# Patient Record
Sex: Female | Born: 1935
Health system: Southern US, Community
[De-identification: ages and names within clinical notes are randomized; demographics above are authoritative.]

## PROBLEM LIST (undated history)

## (undated) DIAGNOSIS — S2232XA Fracture of one rib, left side, initial encounter for closed fracture: Secondary | ICD-10-CM

## (undated) DIAGNOSIS — E785 Hyperlipidemia, unspecified: Secondary | ICD-10-CM

## (undated) DIAGNOSIS — I10 Essential (primary) hypertension: Secondary | ICD-10-CM

## (undated) HISTORY — DX: Hyperlipidemia, unspecified: E78.5

## (undated) HISTORY — DX: Essential (primary) hypertension: I10

## (undated) HISTORY — PX: REFRACTIVE SURGERY: SHX103

## (undated) HISTORY — PX: OTHER SURGICAL HISTORY: SHX169

## (undated) HISTORY — PX: APPENDECTOMY: SHX54

## (undated) HISTORY — DX: Fracture of one rib, left side, initial encounter for closed fracture: S22.32XA

---

## 2001-01-03 ENCOUNTER — Encounter: Admission: RE | Admit: 2001-01-03 | Discharge: 2001-04-03 | Payer: Self-pay | Admitting: Family Medicine

## 2001-05-17 ENCOUNTER — Other Ambulatory Visit: Admission: RE | Admit: 2001-05-17 | Discharge: 2001-05-17 | Payer: Self-pay | Admitting: Family Medicine

## 2001-08-24 ENCOUNTER — Encounter: Admission: RE | Admit: 2001-08-24 | Discharge: 2001-11-22 | Payer: Self-pay | Admitting: Family Medicine

## 2001-12-01 ENCOUNTER — Encounter: Admission: RE | Admit: 2001-12-01 | Discharge: 2001-12-01 | Payer: Self-pay | Admitting: Family Medicine

## 2001-12-01 ENCOUNTER — Encounter: Payer: Self-pay | Admitting: Family Medicine

## 2001-12-13 ENCOUNTER — Encounter: Payer: Self-pay | Admitting: Otolaryngology

## 2001-12-13 ENCOUNTER — Encounter: Admission: RE | Admit: 2001-12-13 | Discharge: 2001-12-13 | Payer: Self-pay | Admitting: Otolaryngology

## 2001-12-20 ENCOUNTER — Ambulatory Visit (HOSPITAL_COMMUNITY): Admission: RE | Admit: 2001-12-20 | Discharge: 2001-12-20 | Payer: Self-pay | Admitting: Family Medicine

## 2001-12-20 ENCOUNTER — Encounter: Payer: Self-pay | Admitting: Internal Medicine

## 2002-06-19 ENCOUNTER — Encounter: Admission: RE | Admit: 2002-06-19 | Discharge: 2002-09-17 | Payer: Self-pay | Admitting: Family Medicine

## 2002-07-27 ENCOUNTER — Encounter: Payer: Self-pay | Admitting: Internal Medicine

## 2002-07-27 ENCOUNTER — Ambulatory Visit (HOSPITAL_COMMUNITY): Admission: RE | Admit: 2002-07-27 | Discharge: 2002-07-27 | Payer: Self-pay | Admitting: Internal Medicine

## 2002-10-02 ENCOUNTER — Ambulatory Visit (HOSPITAL_COMMUNITY): Admission: RE | Admit: 2002-10-02 | Discharge: 2002-10-02 | Payer: Self-pay | Admitting: Internal Medicine

## 2002-12-05 ENCOUNTER — Encounter: Payer: Self-pay | Admitting: Family Medicine

## 2002-12-05 ENCOUNTER — Encounter: Admission: RE | Admit: 2002-12-05 | Discharge: 2002-12-05 | Payer: Self-pay | Admitting: Family Medicine

## 2003-12-05 ENCOUNTER — Encounter: Admission: RE | Admit: 2003-12-05 | Discharge: 2003-12-05 | Payer: Self-pay | Admitting: Family Medicine

## 2003-12-20 ENCOUNTER — Other Ambulatory Visit: Admission: RE | Admit: 2003-12-20 | Discharge: 2003-12-20 | Payer: Self-pay | Admitting: Family Medicine

## 2003-12-25 ENCOUNTER — Encounter: Admission: RE | Admit: 2003-12-25 | Discharge: 2003-12-25 | Payer: Self-pay | Admitting: Family Medicine

## 2004-01-21 ENCOUNTER — Encounter: Admission: RE | Admit: 2004-01-21 | Discharge: 2004-01-21 | Payer: Self-pay | Admitting: Family Medicine

## 2004-01-21 ENCOUNTER — Ambulatory Visit: Payer: Self-pay | Admitting: Family Medicine

## 2004-03-12 ENCOUNTER — Encounter
Admission: RE | Admit: 2004-03-12 | Discharge: 2004-06-10 | Payer: Self-pay | Admitting: Physical Medicine & Rehabilitation

## 2004-03-13 ENCOUNTER — Ambulatory Visit: Payer: Self-pay | Admitting: Physical Medicine & Rehabilitation

## 2004-03-20 ENCOUNTER — Encounter
Admission: RE | Admit: 2004-03-20 | Discharge: 2004-06-12 | Payer: Self-pay | Admitting: Physical Medicine & Rehabilitation

## 2004-05-08 ENCOUNTER — Ambulatory Visit: Payer: Self-pay | Admitting: Physical Medicine & Rehabilitation

## 2004-05-20 ENCOUNTER — Ambulatory Visit: Payer: Self-pay | Admitting: Family Medicine

## 2004-06-03 ENCOUNTER — Ambulatory Visit: Payer: Self-pay

## 2004-06-17 ENCOUNTER — Ambulatory Visit: Payer: Self-pay | Admitting: Family Medicine

## 2004-06-18 ENCOUNTER — Ambulatory Visit: Payer: Self-pay | Admitting: Family Medicine

## 2004-07-29 ENCOUNTER — Ambulatory Visit: Payer: Self-pay | Admitting: Family Medicine

## 2004-09-05 ENCOUNTER — Ambulatory Visit: Payer: Self-pay | Admitting: Family Medicine

## 2004-09-08 ENCOUNTER — Encounter
Admission: RE | Admit: 2004-09-08 | Discharge: 2004-10-02 | Payer: Self-pay | Admitting: Physical Medicine & Rehabilitation

## 2004-09-11 ENCOUNTER — Ambulatory Visit: Payer: Self-pay | Admitting: Internal Medicine

## 2004-09-17 ENCOUNTER — Ambulatory Visit: Payer: Self-pay | Admitting: Family Medicine

## 2004-09-22 ENCOUNTER — Ambulatory Visit: Payer: Self-pay | Admitting: Family Medicine

## 2004-11-18 ENCOUNTER — Encounter
Admission: RE | Admit: 2004-11-18 | Discharge: 2004-12-15 | Payer: Self-pay | Admitting: Physical Medicine & Rehabilitation

## 2004-11-20 ENCOUNTER — Ambulatory Visit: Payer: Self-pay | Admitting: Family Medicine

## 2004-11-25 ENCOUNTER — Ambulatory Visit: Payer: Self-pay | Admitting: Cardiology

## 2004-12-04 ENCOUNTER — Ambulatory Visit: Payer: Self-pay | Admitting: Family Medicine

## 2004-12-05 ENCOUNTER — Encounter: Admission: RE | Admit: 2004-12-05 | Discharge: 2004-12-05 | Payer: Self-pay | Admitting: Family Medicine

## 2004-12-08 ENCOUNTER — Ambulatory Visit: Payer: Self-pay | Admitting: Physical Medicine & Rehabilitation

## 2004-12-08 ENCOUNTER — Encounter
Admission: RE | Admit: 2004-12-08 | Discharge: 2005-03-08 | Payer: Self-pay | Admitting: Physical Medicine & Rehabilitation

## 2004-12-15 ENCOUNTER — Encounter: Admission: RE | Admit: 2004-12-15 | Discharge: 2004-12-15 | Payer: Self-pay | Admitting: Family Medicine

## 2004-12-15 ENCOUNTER — Ambulatory Visit: Payer: Self-pay | Admitting: Family Medicine

## 2004-12-16 ENCOUNTER — Encounter
Admission: RE | Admit: 2004-12-16 | Discharge: 2005-03-16 | Payer: Self-pay | Admitting: Physical Medicine & Rehabilitation

## 2004-12-16 ENCOUNTER — Ambulatory Visit: Payer: Self-pay

## 2004-12-18 ENCOUNTER — Ambulatory Visit: Payer: Self-pay | Admitting: Family Medicine

## 2004-12-22 ENCOUNTER — Ambulatory Visit: Payer: Self-pay | Admitting: Family Medicine

## 2005-01-02 ENCOUNTER — Ambulatory Visit: Payer: Self-pay | Admitting: Family Medicine

## 2005-01-06 ENCOUNTER — Encounter: Admission: RE | Admit: 2005-01-06 | Discharge: 2005-01-06 | Payer: Self-pay | Admitting: Family Medicine

## 2005-01-13 ENCOUNTER — Encounter: Admission: RE | Admit: 2005-01-13 | Discharge: 2005-01-13 | Payer: Self-pay | Admitting: Family Medicine

## 2005-03-31 ENCOUNTER — Ambulatory Visit: Payer: Self-pay | Admitting: Family Medicine

## 2005-04-10 ENCOUNTER — Ambulatory Visit: Payer: Self-pay | Admitting: Family Medicine

## 2005-05-22 ENCOUNTER — Ambulatory Visit: Payer: Self-pay | Admitting: Family Medicine

## 2005-06-15 ENCOUNTER — Ambulatory Visit: Payer: Self-pay | Admitting: Family Medicine

## 2005-06-30 ENCOUNTER — Ambulatory Visit: Payer: Self-pay | Admitting: Family Medicine

## 2005-07-02 ENCOUNTER — Ambulatory Visit: Payer: Self-pay | Admitting: Family Medicine

## 2005-10-07 ENCOUNTER — Ambulatory Visit: Payer: Self-pay | Admitting: Family Medicine

## 2005-10-09 ENCOUNTER — Ambulatory Visit: Payer: Self-pay | Admitting: Family Medicine

## 2005-12-16 ENCOUNTER — Ambulatory Visit: Payer: Self-pay | Admitting: Family Medicine

## 2005-12-23 ENCOUNTER — Ambulatory Visit: Payer: Self-pay | Admitting: Family Medicine

## 2005-12-29 ENCOUNTER — Encounter: Admission: RE | Admit: 2005-12-29 | Discharge: 2006-02-02 | Payer: Self-pay | Admitting: Family Medicine

## 2006-01-01 ENCOUNTER — Ambulatory Visit: Payer: Self-pay | Admitting: Family Medicine

## 2006-01-08 ENCOUNTER — Encounter: Admission: RE | Admit: 2006-01-08 | Discharge: 2006-01-08 | Payer: Self-pay | Admitting: Family Medicine

## 2006-01-19 ENCOUNTER — Ambulatory Visit: Payer: Self-pay | Admitting: Family Medicine

## 2006-01-19 LAB — CONVERTED CEMR LAB
ALT: 21 units/L (ref 0–40)
AST: 28 units/L (ref 0–37)
Albumin: 3.8 g/dL (ref 3.5–5.2)
Alkaline Phosphatase: 33 units/L — ABNORMAL LOW (ref 39–117)
BUN: 10 mg/dL (ref 6–23)
CO2: 28 meq/L (ref 19–32)
Calcium: 9.3 mg/dL (ref 8.4–10.5)
Chloride: 100 meq/L (ref 96–112)
Creatinine, Ser: 0.8 mg/dL (ref 0.4–1.2)
GFR calc non Af Amer: 76 mL/min
Glomerular Filtration Rate, Af Am: 91 mL/min/{1.73_m2}
Glucose, Bld: 114 mg/dL — ABNORMAL HIGH (ref 70–99)
Hgb A1c MFr Bld: 6.1 % — ABNORMAL HIGH (ref 4.6–6.0)
Potassium: 4.2 meq/L (ref 3.5–5.1)
Sodium: 136 meq/L (ref 135–145)
TSH: 1.89 microintl units/mL (ref 0.35–5.50)
Total Bilirubin: 1.2 mg/dL (ref 0.3–1.2)
Total Protein: 6.1 g/dL (ref 6.0–8.3)

## 2006-01-29 ENCOUNTER — Ambulatory Visit: Payer: Self-pay | Admitting: Family Medicine

## 2006-02-03 ENCOUNTER — Encounter: Admission: RE | Admit: 2006-02-03 | Discharge: 2006-02-25 | Payer: Self-pay | Admitting: Family Medicine

## 2006-06-04 ENCOUNTER — Ambulatory Visit: Payer: Self-pay | Admitting: Family Medicine

## 2006-06-07 ENCOUNTER — Encounter: Admission: RE | Admit: 2006-06-07 | Discharge: 2006-08-11 | Payer: Self-pay | Admitting: Family Medicine

## 2006-06-08 ENCOUNTER — Ambulatory Visit: Payer: Self-pay | Admitting: Family Medicine

## 2006-06-08 LAB — CONVERTED CEMR LAB
BUN: 12 mg/dL (ref 6–23)
Basophils Absolute: 0 10*3/uL (ref 0.0–0.1)
Basophils Relative: 0.7 % (ref 0.0–1.0)
CO2: 31 meq/L (ref 19–32)
Calcium: 9.6 mg/dL (ref 8.4–10.5)
Chloride: 105 meq/L (ref 96–112)
Cholesterol: 82 mg/dL (ref 0–200)
Creatinine, Ser: 0.8 mg/dL (ref 0.4–1.2)
Eosinophils Absolute: 0.1 10*3/uL (ref 0.0–0.6)
Eosinophils Relative: 3 % (ref 0.0–5.0)
Folate: >20 ng/mL
GFR calc Af Amer: 91 mL/min
GFR calc non Af Amer: 76 mL/min
Glucose, Bld: 88 mg/dL (ref 70–99)
HCT: 36.3 % (ref 36.0–46.0)
HDL: 37.8 mg/dL — ABNORMAL LOW (ref >39.0)
Hemoglobin: 12.5 g/dL (ref 12.0–15.0)
Hgb A1c MFr Bld: 6.4 % — ABNORMAL HIGH (ref 4.6–6.0)
LDL Cholesterol: 32 mg/dL (ref 0–99)
Lymphocytes Relative: 38.3 % (ref 12.0–46.0)
MCHC: 34.3 g/dL (ref 30.0–36.0)
MCV: 86.7 fL (ref 78.0–100.0)
Monocytes Absolute: 0.5 10*3/uL (ref 0.2–0.7)
Monocytes Relative: 10.5 % (ref 3.0–11.0)
Neutro Abs: 2.1 10*3/uL (ref 1.4–7.7)
Neutrophils Relative %: 47.5 % (ref 43.0–77.0)
Platelets: 183 10*3/uL (ref 150–400)
Potassium: 3.8 meq/L (ref 3.5–5.1)
RBC: 4.19 M/uL (ref 3.87–5.11)
RDW: 13.2 % (ref 11.5–14.6)
Sodium: 141 meq/L (ref 135–145)
TSH: 3.21 microintl units/mL (ref 0.35–5.50)
Total CHOL/HDL Ratio: 2.2
Triglycerides: 63 mg/dL (ref 0–149)
VLDL: 13 mg/dL (ref 0–40)
Vitamin B-12: 449 pg/mL (ref 211–911)
WBC: 4.4 10*3/uL — ABNORMAL LOW (ref 4.5–10.5)

## 2006-07-08 ENCOUNTER — Encounter: Payer: Self-pay | Admitting: Family Medicine

## 2006-08-05 ENCOUNTER — Ambulatory Visit: Payer: Self-pay | Admitting: Family Medicine

## 2006-08-05 DIAGNOSIS — I10 Essential (primary) hypertension: Secondary | ICD-10-CM

## 2006-08-05 DIAGNOSIS — E041 Nontoxic single thyroid nodule: Secondary | ICD-10-CM | POA: Insufficient documentation

## 2006-08-05 DIAGNOSIS — E1151 Type 2 diabetes mellitus with diabetic peripheral angiopathy without gangrene: Secondary | ICD-10-CM

## 2006-08-05 DIAGNOSIS — M810 Age-related osteoporosis without current pathological fracture: Secondary | ICD-10-CM | POA: Insufficient documentation

## 2006-08-05 DIAGNOSIS — E785 Hyperlipidemia, unspecified: Secondary | ICD-10-CM

## 2006-08-05 DIAGNOSIS — E1165 Type 2 diabetes mellitus with hyperglycemia: Secondary | ICD-10-CM

## 2006-08-06 ENCOUNTER — Encounter: Admission: RE | Admit: 2006-08-06 | Discharge: 2006-08-06 | Payer: Self-pay | Admitting: Family Medicine

## 2006-08-09 LAB — CONVERTED CEMR LAB
Free T4: 0.9 ng/dL (ref 0.6–1.6)
T3, Free: 2.5 pg/mL (ref 2.3–4.2)
TSH: 3 microintl units/mL (ref 0.35–5.50)

## 2006-08-11 ENCOUNTER — Encounter: Payer: Self-pay | Admitting: Family Medicine

## 2006-10-06 ENCOUNTER — Telehealth (INDEPENDENT_AMBULATORY_CARE_PROVIDER_SITE_OTHER): Payer: Self-pay | Admitting: *Deleted

## 2006-10-11 ENCOUNTER — Ambulatory Visit: Payer: Self-pay | Admitting: Family Medicine

## 2006-10-12 ENCOUNTER — Ambulatory Visit: Payer: Self-pay | Admitting: Family Medicine

## 2006-10-12 DIAGNOSIS — S61209A Unspecified open wound of unspecified finger without damage to nail, initial encounter: Secondary | ICD-10-CM

## 2006-10-12 LAB — CONVERTED CEMR LAB
ALT: 21 units/L (ref 0–35)
AST: 25 units/L (ref 0–37)
Albumin: 3.7 g/dL (ref 3.5–5.2)
Alkaline Phosphatase: 34 units/L — ABNORMAL LOW (ref 39–117)
BUN: 10 mg/dL (ref 6–23)
Basophils Absolute: 0 10*3/uL (ref 0.0–0.1)
Basophils Relative: 0.6 % (ref 0.0–1.0)
Bilirubin, Direct: 0.1 mg/dL (ref 0.0–0.3)
CO2: 30 meq/L (ref 19–32)
Calcium: 8.9 mg/dL (ref 8.4–10.5)
Chloride: 103 meq/L (ref 96–112)
Cholesterol: 90 mg/dL (ref 0–200)
Creatinine, Ser: 0.7 mg/dL (ref 0.4–1.2)
Eosinophils Absolute: 0.2 10*3/uL (ref 0.0–0.6)
Eosinophils Relative: 3.5 % (ref 0.0–5.0)
GFR calc Af Amer: 107 mL/min
GFR calc non Af Amer: 88 mL/min
Glucose, Bld: 116 mg/dL — ABNORMAL HIGH (ref 70–99)
HCT: 37.1 % (ref 36.0–46.0)
HDL: 37.6 mg/dL — ABNORMAL LOW (ref >39.0)
Hemoglobin: 12.7 g/dL (ref 12.0–15.0)
Hgb A1c MFr Bld: 6.3 % — ABNORMAL HIGH (ref 4.6–6.0)
LDL Cholesterol: 39 mg/dL (ref 0–99)
Lymphocytes Relative: 36.8 % (ref 12.0–46.0)
MCHC: 34.2 g/dL (ref 30.0–36.0)
MCV: 87.9 fL (ref 78.0–100.0)
Monocytes Absolute: 0.5 10*3/uL (ref 0.2–0.7)
Monocytes Relative: 9.5 % (ref 3.0–11.0)
Neutro Abs: 2.6 10*3/uL (ref 1.4–7.7)
Neutrophils Relative %: 49.6 % (ref 43.0–77.0)
Platelets: 196 10*3/uL (ref 150–400)
Potassium: 4.4 meq/L (ref 3.5–5.1)
RBC: 4.22 M/uL (ref 3.87–5.11)
RDW: 12.8 % (ref 11.5–14.6)
Sodium: 139 meq/L (ref 135–145)
TSH: 1.97 microintl units/mL (ref 0.35–5.50)
Total Bilirubin: 1.3 mg/dL — ABNORMAL HIGH (ref 0.3–1.2)
Total CHOL/HDL Ratio: 2.4
Total Protein: 6.3 g/dL (ref 6.0–8.3)
Triglycerides: 68 mg/dL (ref 0–149)
VLDL: 14 mg/dL (ref 0–40)
WBC: 5.3 10*3/uL (ref 4.5–10.5)

## 2006-10-13 ENCOUNTER — Encounter: Payer: Self-pay | Admitting: Family Medicine

## 2006-11-23 ENCOUNTER — Encounter: Payer: Self-pay | Admitting: Family Medicine

## 2006-11-29 ENCOUNTER — Encounter: Admission: RE | Admit: 2006-11-29 | Discharge: 2007-01-13 | Payer: Self-pay | Admitting: Family Medicine

## 2006-11-29 ENCOUNTER — Encounter: Payer: Self-pay | Admitting: Family Medicine

## 2006-12-08 ENCOUNTER — Encounter: Payer: Self-pay | Admitting: Family Medicine

## 2006-12-17 ENCOUNTER — Encounter: Payer: Self-pay | Admitting: Family Medicine

## 2007-01-04 ENCOUNTER — Encounter: Payer: Self-pay | Admitting: Family Medicine

## 2007-01-05 ENCOUNTER — Encounter: Payer: Self-pay | Admitting: Family Medicine

## 2007-01-12 ENCOUNTER — Ambulatory Visit: Payer: Self-pay | Admitting: Family Medicine

## 2007-03-08 ENCOUNTER — Encounter: Admission: RE | Admit: 2007-03-08 | Discharge: 2007-03-08 | Payer: Self-pay | Admitting: Family Medicine

## 2007-03-14 ENCOUNTER — Encounter (INDEPENDENT_AMBULATORY_CARE_PROVIDER_SITE_OTHER): Payer: Self-pay | Admitting: *Deleted

## 2007-05-16 ENCOUNTER — Ambulatory Visit: Payer: Self-pay | Admitting: Family Medicine

## 2007-05-16 ENCOUNTER — Telehealth (INDEPENDENT_AMBULATORY_CARE_PROVIDER_SITE_OTHER): Payer: Self-pay | Admitting: *Deleted

## 2007-05-16 DIAGNOSIS — N390 Urinary tract infection, site not specified: Secondary | ICD-10-CM

## 2007-05-16 LAB — CONVERTED CEMR LAB
Bilirubin Urine: NEGATIVE
Glucose, Urine, Semiquant: NEGATIVE
Ketones, urine, test strip: NEGATIVE
Nitrite: NEGATIVE
Protein, U semiquant: 30
Specific Gravity, Urine: >=1.03
Urobilinogen, UA: NEGATIVE
WBC Urine, dipstick: NEGATIVE
pH: 5.5

## 2007-05-17 ENCOUNTER — Encounter: Payer: Self-pay | Admitting: Family Medicine

## 2007-05-18 ENCOUNTER — Encounter: Payer: Self-pay | Admitting: Family Medicine

## 2007-05-23 ENCOUNTER — Encounter (INDEPENDENT_AMBULATORY_CARE_PROVIDER_SITE_OTHER): Payer: Self-pay | Admitting: *Deleted

## 2007-05-30 ENCOUNTER — Telehealth (INDEPENDENT_AMBULATORY_CARE_PROVIDER_SITE_OTHER): Payer: Self-pay | Admitting: *Deleted

## 2007-06-01 ENCOUNTER — Ambulatory Visit: Payer: Self-pay | Admitting: Family Medicine

## 2007-06-06 ENCOUNTER — Telehealth (INDEPENDENT_AMBULATORY_CARE_PROVIDER_SITE_OTHER): Payer: Self-pay | Admitting: *Deleted

## 2007-06-07 ENCOUNTER — Encounter (INDEPENDENT_AMBULATORY_CARE_PROVIDER_SITE_OTHER): Payer: Self-pay | Admitting: *Deleted

## 2007-07-14 ENCOUNTER — Encounter: Payer: Self-pay | Admitting: Family Medicine

## 2007-07-20 ENCOUNTER — Ambulatory Visit: Payer: Self-pay | Admitting: Family Medicine

## 2007-07-20 DIAGNOSIS — S86819A Strain of other muscle(s) and tendon(s) at lower leg level, unspecified leg, initial encounter: Secondary | ICD-10-CM

## 2007-07-20 DIAGNOSIS — S838X9A Sprain of other specified parts of unspecified knee, initial encounter: Secondary | ICD-10-CM

## 2007-07-22 ENCOUNTER — Telehealth (INDEPENDENT_AMBULATORY_CARE_PROVIDER_SITE_OTHER): Payer: Self-pay | Admitting: *Deleted

## 2007-07-28 ENCOUNTER — Telehealth (INDEPENDENT_AMBULATORY_CARE_PROVIDER_SITE_OTHER): Payer: Self-pay | Admitting: *Deleted

## 2007-08-03 ENCOUNTER — Telehealth (INDEPENDENT_AMBULATORY_CARE_PROVIDER_SITE_OTHER): Payer: Self-pay | Admitting: *Deleted

## 2007-10-27 ENCOUNTER — Encounter: Payer: Self-pay | Admitting: Family Medicine

## 2007-12-09 ENCOUNTER — Ambulatory Visit: Payer: Self-pay | Admitting: Family Medicine

## 2007-12-09 ENCOUNTER — Other Ambulatory Visit: Admission: RE | Admit: 2007-12-09 | Discharge: 2007-12-09 | Payer: Self-pay | Admitting: Family Medicine

## 2007-12-09 ENCOUNTER — Encounter: Payer: Self-pay | Admitting: Family Medicine

## 2007-12-13 ENCOUNTER — Encounter: Payer: Self-pay | Admitting: Family Medicine

## 2007-12-15 ENCOUNTER — Ambulatory Visit: Payer: Self-pay | Admitting: Family Medicine

## 2007-12-15 LAB — CONVERTED CEMR LAB
Bilirubin Urine: NEGATIVE
Blood in Urine, dipstick: NEGATIVE
Glucose, Urine, Semiquant: NEGATIVE
Ketones, urine, test strip: NEGATIVE
Nitrite: NEGATIVE
Protein, U semiquant: NEGATIVE
Specific Gravity, Urine: <1.005
Urobilinogen, UA: 0.2
WBC Urine, dipstick: NEGATIVE
pH: 8

## 2007-12-17 LAB — CONVERTED CEMR LAB
ALT: 25 units/L (ref 0–35)
AST: 24 units/L (ref 0–37)
Albumin: 3.8 g/dL (ref 3.5–5.2)
Alkaline Phosphatase: 42 units/L (ref 39–117)
BUN: 11 mg/dL (ref 6–23)
Basophils Absolute: 0 10*3/uL (ref 0.0–0.1)
Basophils Relative: 0.2 % (ref 0.0–3.0)
Bilirubin, Direct: 0.1 mg/dL (ref 0.0–0.3)
CO2: 31 meq/L (ref 19–32)
Calcium: 9 mg/dL (ref 8.4–10.5)
Chloride: 101 meq/L (ref 96–112)
Cholesterol: 95 mg/dL (ref 0–200)
Creatinine, Ser: 0.7 mg/dL (ref 0.4–1.2)
Eosinophils Absolute: 0.2 10*3/uL (ref 0.0–0.7)
Eosinophils Relative: 3 % (ref 0.0–5.0)
GFR calc Af Amer: 106 mL/min
GFR calc non Af Amer: 88 mL/min
Glucose, Bld: 93 mg/dL (ref 70–99)
HCT: 38.9 % (ref 36.0–46.0)
HDL: 45.3 mg/dL (ref >39.0)
Hemoglobin: 13.3 g/dL (ref 12.0–15.0)
LDL Cholesterol: 39 mg/dL (ref 0–99)
Lymphocytes Relative: 34 % (ref 12.0–46.0)
MCHC: 34.1 g/dL (ref 30.0–36.0)
MCV: 86.8 fL (ref 78.0–100.0)
Monocytes Absolute: 0.6 10*3/uL (ref 0.1–1.0)
Monocytes Relative: 10.6 % (ref 3.0–12.0)
Neutro Abs: 3 10*3/uL (ref 1.4–7.7)
Neutrophils Relative %: 52.2 % (ref 43.0–77.0)
Platelets: 211 10*3/uL (ref 150–400)
Potassium: 4.1 meq/L (ref 3.5–5.1)
RBC: 4.48 M/uL (ref 3.87–5.11)
RDW: 12.7 % (ref 11.5–14.6)
Sodium: 138 meq/L (ref 135–145)
TSH: 2.47 microintl units/mL (ref 0.35–5.50)
Total Bilirubin: 1.2 mg/dL (ref 0.3–1.2)
Total CHOL/HDL Ratio: 2.1
Total Protein: 6.7 g/dL (ref 6.0–8.3)
Triglycerides: 54 mg/dL (ref 0–149)
VLDL: 11 mg/dL (ref 0–40)
Vit D, 1,25-Dihydroxy: 42 (ref 30–89)
WBC: 5.8 10*3/uL (ref 4.5–10.5)

## 2007-12-20 ENCOUNTER — Encounter (INDEPENDENT_AMBULATORY_CARE_PROVIDER_SITE_OTHER): Payer: Self-pay | Admitting: *Deleted

## 2007-12-20 ENCOUNTER — Telehealth (INDEPENDENT_AMBULATORY_CARE_PROVIDER_SITE_OTHER): Payer: Self-pay | Admitting: *Deleted

## 2007-12-27 ENCOUNTER — Encounter: Payer: Self-pay | Admitting: Family Medicine

## 2007-12-27 ENCOUNTER — Telehealth (INDEPENDENT_AMBULATORY_CARE_PROVIDER_SITE_OTHER): Payer: Self-pay | Admitting: *Deleted

## 2007-12-29 ENCOUNTER — Ambulatory Visit: Payer: Self-pay | Admitting: Family Medicine

## 2007-12-29 ENCOUNTER — Ambulatory Visit: Payer: Self-pay | Admitting: Internal Medicine

## 2008-01-06 ENCOUNTER — Telehealth (INDEPENDENT_AMBULATORY_CARE_PROVIDER_SITE_OTHER): Payer: Self-pay | Admitting: *Deleted

## 2008-01-06 ENCOUNTER — Encounter (INDEPENDENT_AMBULATORY_CARE_PROVIDER_SITE_OTHER): Payer: Self-pay | Admitting: *Deleted

## 2008-01-10 ENCOUNTER — Encounter (INDEPENDENT_AMBULATORY_CARE_PROVIDER_SITE_OTHER): Payer: Self-pay | Admitting: *Deleted

## 2008-01-10 ENCOUNTER — Telehealth (INDEPENDENT_AMBULATORY_CARE_PROVIDER_SITE_OTHER): Payer: Self-pay | Admitting: *Deleted

## 2008-01-23 DIAGNOSIS — K219 Gastro-esophageal reflux disease without esophagitis: Secondary | ICD-10-CM | POA: Insufficient documentation

## 2008-01-24 ENCOUNTER — Ambulatory Visit: Payer: Self-pay | Admitting: Internal Medicine

## 2008-01-24 DIAGNOSIS — K5900 Constipation, unspecified: Secondary | ICD-10-CM

## 2008-02-01 ENCOUNTER — Ambulatory Visit: Payer: Self-pay | Admitting: Internal Medicine

## 2008-03-02 ENCOUNTER — Telehealth (INDEPENDENT_AMBULATORY_CARE_PROVIDER_SITE_OTHER): Payer: Self-pay | Admitting: *Deleted

## 2008-03-09 ENCOUNTER — Encounter: Admission: RE | Admit: 2008-03-09 | Discharge: 2008-03-09 | Payer: Self-pay | Admitting: Family Medicine

## 2008-04-13 ENCOUNTER — Encounter: Payer: Self-pay | Admitting: Family Medicine

## 2008-05-16 ENCOUNTER — Encounter: Admission: RE | Admit: 2008-05-16 | Discharge: 2008-05-16 | Payer: Self-pay | Admitting: Family Medicine

## 2008-05-21 ENCOUNTER — Encounter (INDEPENDENT_AMBULATORY_CARE_PROVIDER_SITE_OTHER): Payer: Self-pay | Admitting: *Deleted

## 2008-06-19 ENCOUNTER — Telehealth (INDEPENDENT_AMBULATORY_CARE_PROVIDER_SITE_OTHER): Payer: Self-pay | Admitting: *Deleted

## 2008-07-02 ENCOUNTER — Encounter: Payer: Self-pay | Admitting: Family Medicine

## 2008-12-17 ENCOUNTER — Other Ambulatory Visit: Admission: RE | Admit: 2008-12-17 | Discharge: 2008-12-17 | Payer: Self-pay | Admitting: Family Medicine

## 2008-12-17 ENCOUNTER — Encounter: Payer: Self-pay | Admitting: Family Medicine

## 2008-12-17 ENCOUNTER — Ambulatory Visit: Payer: Self-pay | Admitting: Family Medicine

## 2008-12-17 DIAGNOSIS — R141 Gas pain: Secondary | ICD-10-CM

## 2008-12-17 DIAGNOSIS — R142 Eructation: Secondary | ICD-10-CM

## 2008-12-17 DIAGNOSIS — R143 Flatulence: Secondary | ICD-10-CM

## 2008-12-18 ENCOUNTER — Ambulatory Visit: Payer: Self-pay | Admitting: Diagnostic Radiology

## 2008-12-18 ENCOUNTER — Ambulatory Visit (HOSPITAL_BASED_OUTPATIENT_CLINIC_OR_DEPARTMENT_OTHER): Admission: RE | Admit: 2008-12-18 | Discharge: 2008-12-18 | Payer: Self-pay | Admitting: Family Medicine

## 2008-12-18 ENCOUNTER — Encounter (INDEPENDENT_AMBULATORY_CARE_PROVIDER_SITE_OTHER): Payer: Self-pay | Admitting: *Deleted

## 2008-12-18 ENCOUNTER — Telehealth: Payer: Self-pay | Admitting: Family Medicine

## 2008-12-18 ENCOUNTER — Encounter: Payer: Self-pay | Admitting: Family Medicine

## 2008-12-18 DIAGNOSIS — D259 Leiomyoma of uterus, unspecified: Secondary | ICD-10-CM

## 2008-12-19 ENCOUNTER — Ambulatory Visit: Payer: Self-pay | Admitting: Internal Medicine

## 2008-12-19 ENCOUNTER — Encounter: Payer: Self-pay | Admitting: Family Medicine

## 2008-12-20 ENCOUNTER — Telehealth (INDEPENDENT_AMBULATORY_CARE_PROVIDER_SITE_OTHER): Payer: Self-pay | Admitting: *Deleted

## 2008-12-20 DIAGNOSIS — G8911 Acute pain due to trauma: Secondary | ICD-10-CM | POA: Insufficient documentation

## 2008-12-20 DIAGNOSIS — M545 Low back pain: Secondary | ICD-10-CM

## 2008-12-21 ENCOUNTER — Encounter: Payer: Self-pay | Admitting: Family Medicine

## 2008-12-25 ENCOUNTER — Encounter: Admission: RE | Admit: 2008-12-25 | Discharge: 2009-02-18 | Payer: Self-pay | Admitting: Family Medicine

## 2008-12-26 ENCOUNTER — Encounter: Payer: Self-pay | Admitting: Family Medicine

## 2008-12-31 ENCOUNTER — Encounter (INDEPENDENT_AMBULATORY_CARE_PROVIDER_SITE_OTHER): Payer: Self-pay | Admitting: *Deleted

## 2009-02-06 ENCOUNTER — Encounter: Payer: Self-pay | Admitting: Family Medicine

## 2009-02-11 ENCOUNTER — Telehealth: Payer: Self-pay | Admitting: Family Medicine

## 2009-02-18 ENCOUNTER — Encounter: Admission: RE | Admit: 2009-02-18 | Discharge: 2009-04-05 | Payer: Self-pay | Admitting: Family Medicine

## 2009-03-11 ENCOUNTER — Encounter: Admission: RE | Admit: 2009-03-11 | Discharge: 2009-03-11 | Payer: Self-pay | Admitting: Family Medicine

## 2009-04-04 ENCOUNTER — Encounter: Payer: Self-pay | Admitting: Family Medicine

## 2009-04-22 ENCOUNTER — Encounter: Payer: Self-pay | Admitting: Family Medicine

## 2009-04-29 ENCOUNTER — Telehealth: Payer: Self-pay | Admitting: Family Medicine

## 2009-05-01 ENCOUNTER — Telehealth: Payer: Self-pay | Admitting: Family Medicine

## 2009-06-28 ENCOUNTER — Ambulatory Visit: Payer: Self-pay | Admitting: Family Medicine

## 2009-06-28 DIAGNOSIS — E1149 Type 2 diabetes mellitus with other diabetic neurological complication: Secondary | ICD-10-CM

## 2009-07-01 LAB — CONVERTED CEMR LAB
ALT: 19 units/L (ref 0–35)
AST: 24 units/L (ref 0–37)
Albumin: 4.2 g/dL (ref 3.5–5.2)
Alkaline Phosphatase: 53 units/L (ref 39–117)
BUN: 11 mg/dL (ref 6–23)
Basophils Absolute: 0 10*3/uL (ref 0.0–0.1)
Basophils Relative: 0.2 % (ref 0.0–3.0)
Bilirubin, Direct: 0.2 mg/dL (ref 0.0–0.3)
CO2: 31 meq/L (ref 19–32)
Calcium: 9.4 mg/dL (ref 8.4–10.5)
Chloride: 98 meq/L (ref 96–112)
Cholesterol: 113 mg/dL (ref 0–200)
Creatinine, Ser: 0.6 mg/dL (ref 0.4–1.2)
Eosinophils Absolute: 0.2 10*3/uL (ref 0.0–0.7)
Eosinophils Relative: 2.7 % (ref 0.0–5.0)
Folate: >20 ng/mL
GFR calc non Af Amer: 100.37 mL/min (ref >60)
Glucose, Bld: 101 mg/dL — ABNORMAL HIGH (ref 70–99)
HCT: 38.8 % (ref 36.0–46.0)
HDL: 65.3 mg/dL (ref >39.00)
Hemoglobin: 13.1 g/dL (ref 12.0–15.0)
LDL Cholesterol: 40 mg/dL (ref 0–99)
Lymphocytes Relative: 24.1 % (ref 12.0–46.0)
Lymphs Abs: 1.7 10*3/uL (ref 0.7–4.0)
MCHC: 33.7 g/dL (ref 30.0–36.0)
MCV: 86.8 fL (ref 78.0–100.0)
Monocytes Absolute: 0.7 10*3/uL (ref 0.1–1.0)
Monocytes Relative: 9.9 % (ref 3.0–12.0)
Neutro Abs: 4.4 10*3/uL (ref 1.4–7.7)
Neutrophils Relative %: 63.1 % (ref 43.0–77.0)
Platelets: 249 10*3/uL (ref 150.0–400.0)
Potassium: 4.4 meq/L (ref 3.5–5.1)
RBC: 4.47 M/uL (ref 3.87–5.11)
RDW: 13.9 % (ref 11.5–14.6)
Sodium: 137 meq/L (ref 135–145)
TSH: 2.31 microintl units/mL (ref 0.35–5.50)
Total Bilirubin: 0.8 mg/dL (ref 0.3–1.2)
Total CHOL/HDL Ratio: 2
Total Protein: 7.1 g/dL (ref 6.0–8.3)
Triglycerides: 39 mg/dL (ref 0.0–149.0)
VLDL: 7.8 mg/dL (ref 0.0–40.0)
Vit D, 25-Hydroxy: 36 ng/mL (ref 30–89)
Vitamin B-12: 617 pg/mL (ref 211–911)
WBC: 7 10*3/uL (ref 4.5–10.5)

## 2009-07-10 ENCOUNTER — Telehealth: Payer: Self-pay | Admitting: Family Medicine

## 2009-07-11 ENCOUNTER — Telehealth (INDEPENDENT_AMBULATORY_CARE_PROVIDER_SITE_OTHER): Payer: Self-pay | Admitting: *Deleted

## 2009-07-18 ENCOUNTER — Telehealth (INDEPENDENT_AMBULATORY_CARE_PROVIDER_SITE_OTHER): Payer: Self-pay | Admitting: *Deleted

## 2009-07-24 ENCOUNTER — Telehealth: Payer: Self-pay | Admitting: Internal Medicine

## 2009-07-26 ENCOUNTER — Telehealth: Payer: Self-pay | Admitting: Internal Medicine

## 2009-07-29 ENCOUNTER — Telehealth: Payer: Self-pay | Admitting: Internal Medicine

## 2009-08-23 ENCOUNTER — Encounter: Payer: Self-pay | Admitting: Family Medicine

## 2009-09-20 ENCOUNTER — Telehealth: Payer: Self-pay | Admitting: Family Medicine

## 2009-11-04 ENCOUNTER — Encounter: Payer: Self-pay | Admitting: Family Medicine

## 2009-12-23 ENCOUNTER — Ambulatory Visit: Payer: Self-pay | Admitting: Family Medicine

## 2009-12-23 ENCOUNTER — Telehealth (INDEPENDENT_AMBULATORY_CARE_PROVIDER_SITE_OTHER): Payer: Self-pay | Admitting: *Deleted

## 2009-12-23 DIAGNOSIS — J42 Unspecified chronic bronchitis: Secondary | ICD-10-CM

## 2009-12-23 DIAGNOSIS — R413 Other amnesia: Secondary | ICD-10-CM

## 2009-12-24 LAB — CONVERTED CEMR LAB
ALT: 21 units/L (ref 0–35)
AST: 22 units/L (ref 0–37)
Albumin: 4.2 g/dL (ref 3.5–5.2)
Alkaline Phosphatase: 57 units/L (ref 39–117)
BUN: 10 mg/dL (ref 6–23)
Basophils Absolute: 0 10*3/uL (ref 0.0–0.1)
Basophils Relative: 0.2 % (ref 0.0–3.0)
Bilirubin, Direct: 0.2 mg/dL (ref 0.0–0.3)
CO2: 29 meq/L (ref 19–32)
Calcium: 9 mg/dL (ref 8.4–10.5)
Chloride: 92 meq/L — ABNORMAL LOW (ref 96–112)
Creatinine, Ser: 0.5 mg/dL (ref 0.4–1.2)
Eosinophils Absolute: 0.2 10*3/uL (ref 0.0–0.7)
Eosinophils Relative: 2.1 % (ref 0.0–5.0)
Folate: >20 ng/mL
GFR calc non Af Amer: 131.5 mL/min (ref >60)
Glucose, Bld: 135 mg/dL — ABNORMAL HIGH (ref 70–99)
HCT: 39.6 % (ref 36.0–46.0)
Hemoglobin: 13.4 g/dL (ref 12.0–15.0)
Lymphocytes Relative: 23.5 % (ref 12.0–46.0)
Lymphs Abs: 2.2 10*3/uL (ref 0.7–4.0)
MCHC: 33.8 g/dL (ref 30.0–36.0)
MCV: 86.2 fL (ref 78.0–100.0)
Monocytes Absolute: 0.9 10*3/uL (ref 0.1–1.0)
Monocytes Relative: 9.8 % (ref 3.0–12.0)
Neutro Abs: 6.1 10*3/uL (ref 1.4–7.7)
Neutrophils Relative %: 64.4 % (ref 43.0–77.0)
Platelets: 252 10*3/uL (ref 150.0–400.0)
Potassium: 4.5 meq/L (ref 3.5–5.1)
RBC: 4.6 M/uL (ref 3.87–5.11)
RDW: 13.8 % (ref 11.5–14.6)
Sodium: 135 meq/L (ref 135–145)
TSH: 0.3 microintl units/mL — ABNORMAL LOW (ref 0.35–5.50)
Total Bilirubin: 1.1 mg/dL (ref 0.3–1.2)
Total Protein: 7 g/dL (ref 6.0–8.3)
Vitamin B-12: 429 pg/mL (ref 211–911)
WBC: 9.5 10*3/uL (ref 4.5–10.5)

## 2009-12-25 ENCOUNTER — Encounter: Payer: Self-pay | Admitting: Family Medicine

## 2009-12-26 ENCOUNTER — Encounter: Payer: Self-pay | Admitting: Family Medicine

## 2010-01-02 ENCOUNTER — Telehealth (INDEPENDENT_AMBULATORY_CARE_PROVIDER_SITE_OTHER): Payer: Self-pay | Admitting: *Deleted

## 2010-01-08 ENCOUNTER — Encounter (INDEPENDENT_AMBULATORY_CARE_PROVIDER_SITE_OTHER): Payer: Self-pay | Admitting: *Deleted

## 2010-01-09 ENCOUNTER — Telehealth (INDEPENDENT_AMBULATORY_CARE_PROVIDER_SITE_OTHER): Payer: Self-pay | Admitting: *Deleted

## 2010-01-14 ENCOUNTER — Telehealth (INDEPENDENT_AMBULATORY_CARE_PROVIDER_SITE_OTHER): Payer: Self-pay | Admitting: *Deleted

## 2010-01-21 ENCOUNTER — Encounter: Payer: Self-pay | Admitting: Family Medicine

## 2010-01-22 ENCOUNTER — Telehealth (INDEPENDENT_AMBULATORY_CARE_PROVIDER_SITE_OTHER): Payer: Self-pay | Admitting: *Deleted

## 2010-01-23 ENCOUNTER — Encounter: Payer: Self-pay | Admitting: Family Medicine

## 2010-01-23 ENCOUNTER — Telehealth (INDEPENDENT_AMBULATORY_CARE_PROVIDER_SITE_OTHER): Payer: Self-pay | Admitting: *Deleted

## 2010-01-24 ENCOUNTER — Telehealth (INDEPENDENT_AMBULATORY_CARE_PROVIDER_SITE_OTHER): Payer: Self-pay | Admitting: *Deleted

## 2010-01-24 ENCOUNTER — Encounter: Payer: Self-pay | Admitting: Family Medicine

## 2010-03-16 ENCOUNTER — Encounter: Payer: Self-pay | Admitting: Family Medicine

## 2010-03-23 LAB — CONVERTED CEMR LAB
ALT: 20 units/L (ref 0–35)
ALT: 22 units/L (ref 0–35)
ALT: 24 units/L (ref 0–35)
AST: 22 units/L (ref 0–37)
AST: 25 units/L (ref 0–37)
AST: 27 units/L (ref 0–37)
Albumin: 3.9 g/dL (ref 3.5–5.2)
Albumin: 4 g/dL (ref 3.5–5.2)
Albumin: 4.1 g/dL (ref 3.5–5.2)
Alkaline Phosphatase: 40 units/L (ref 39–117)
Alkaline Phosphatase: 41 units/L (ref 39–117)
Alkaline Phosphatase: 52 units/L (ref 39–117)
BUN: 11 mg/dL (ref 6–23)
BUN: 13 mg/dL (ref 6–23)
Basophils Absolute: 0 10*3/uL (ref 0.0–0.1)
Basophils Relative: 0.8 % (ref 0.0–3.0)
Bilirubin Urine: NEGATIVE
Bilirubin, Direct: 0.1 mg/dL (ref 0.0–0.3)
Bilirubin, Direct: 0.2 mg/dL (ref 0.0–0.3)
Bilirubin, Direct: 0.2 mg/dL (ref 0.0–0.3)
Blood in Urine, dipstick: NEGATIVE
CO2: 28 meq/L (ref 19–32)
CO2: 29 meq/L (ref 19–32)
Calcium: 10 mg/dL (ref 8.4–10.5)
Calcium: 9.3 mg/dL (ref 8.4–10.5)
Chloride: 100 meq/L (ref 96–112)
Chloride: 98 meq/L (ref 96–112)
Cholesterol, target level: 200 mg/dL
Cholesterol: 112 mg/dL (ref 0–200)
Creatinine, Ser: 0.6 mg/dL (ref 0.4–1.2)
Creatinine, Ser: 0.7 mg/dL (ref 0.4–1.2)
Creatinine,U: 109.3 mg/dL
Creatinine,U: 57 mg/dL
Creatinine,U: 73.3 mg/dL
Eosinophils Absolute: 0.2 10*3/uL (ref 0.0–0.7)
Eosinophils Relative: 2.7 % (ref 0.0–5.0)
Folate: >20 ng/mL
GFR calc Af Amer: 127 mL/min
GFR calc non Af Amer: 105 mL/min
GFR calc non Af Amer: 87.38 mL/min (ref >60)
Glucose, Bld: 140 mg/dL — ABNORMAL HIGH (ref 70–99)
Glucose, Bld: 171 mg/dL — ABNORMAL HIGH (ref 70–99)
Glucose, Urine, Semiquant: NEGATIVE
HCT: 39.1 % (ref 36.0–46.0)
HDL goal, serum: 40 mg/dL
HDL: 61.9 mg/dL (ref >39.00)
Hemoglobin: 13.5 g/dL (ref 12.0–15.0)
Hgb A1c MFr Bld: 6.6 % — ABNORMAL HIGH (ref 4.6–6.0)
Hgb A1c MFr Bld: 6.6 % — ABNORMAL HIGH (ref 4.6–6.0)
Hgb A1c MFr Bld: 6.7 % — ABNORMAL HIGH (ref 4.6–6.5)
Hgb A1c MFr Bld: 7 % — ABNORMAL HIGH (ref 4.6–6.0)
Ketones, urine, test strip: NEGATIVE
LDL Cholesterol: 40 mg/dL (ref 0–99)
LDL Goal: 100 mg/dL
Lymphocytes Relative: 32 % (ref 12.0–46.0)
Lymphs Abs: 1.9 10*3/uL (ref 0.7–4.0)
MCHC: 34.5 g/dL (ref 30.0–36.0)
MCV: 90.5 fL (ref 78.0–100.0)
Microalb Creat Ratio: 17.5 mg/g (ref 0.0–30.0)
Microalb Creat Ratio: 17.7 mg/g (ref 0.0–30.0)
Microalb Creat Ratio: 22 mg/g (ref 0.0–30.0)
Microalb, Ur: 1 mg/dL (ref 0.0–1.9)
Microalb, Ur: 1.3 mg/dL (ref 0.0–1.9)
Microalb, Ur: 2.4 mg/dL — ABNORMAL HIGH (ref 0.0–1.9)
Monocytes Absolute: 0.4 10*3/uL (ref 0.1–1.0)
Monocytes Relative: 6 % (ref 3.0–12.0)
Neutro Abs: 3.4 10*3/uL (ref 1.4–7.7)
Neutrophils Relative %: 58.5 % (ref 43.0–77.0)
Nitrite: NEGATIVE
Platelets: 198 10*3/uL (ref 150.0–400.0)
Potassium: 3.9 meq/L (ref 3.5–5.1)
Potassium: 4.5 meq/L (ref 3.5–5.1)
Protein, U semiquant: NEGATIVE
RBC: 4.32 M/uL (ref 3.87–5.11)
RDW: 12.3 % (ref 11.5–14.6)
Sodium: 136 meq/L (ref 135–145)
Sodium: 137 meq/L (ref 135–145)
Specific Gravity, Urine: <1.005
TSH: 2.97 microintl units/mL (ref 0.35–5.50)
Total Bilirubin: 1.1 mg/dL (ref 0.3–1.2)
Total Bilirubin: 1.1 mg/dL (ref 0.3–1.2)
Total Bilirubin: 1.2 mg/dL (ref 0.3–1.2)
Total CHOL/HDL Ratio: 2
Total Protein: 6.7 g/dL (ref 6.0–8.3)
Total Protein: 7.1 g/dL (ref 6.0–8.3)
Total Protein: 7.2 g/dL (ref 6.0–8.3)
Triglycerides: 53 mg/dL (ref 0.0–149.0)
Urobilinogen, UA: NEGATIVE
VLDL: 10.6 mg/dL (ref 0.0–40.0)
Vit D, 25-Hydroxy: 33 ng/mL (ref 30–89)
Vitamin B-12: 782 pg/mL (ref 211–911)
WBC Urine, dipstick: NEGATIVE
WBC: 5.9 10*3/uL (ref 4.5–10.5)
pH: 7.5

## 2010-03-25 ENCOUNTER — Other Ambulatory Visit: Payer: Self-pay | Admitting: Family Medicine

## 2010-03-25 DIAGNOSIS — Z1239 Encounter for other screening for malignant neoplasm of breast: Secondary | ICD-10-CM

## 2010-03-25 NOTE — Letter (Signed)
Summary: [facility] Endocrinology & Diabetes  [facility] Endocrinology & Diabetes   Imported By: Lanelle Bal 04/29/2009 12:51:55  _____________________________________________________________________  External Attachment:    Type:   Image     Comment:   External Document

## 2010-03-25 NOTE — Progress Notes (Signed)
Summary: Medication change  Phone Note Outgoing Call   Call placed by: Almeta Monas CMA Duncan Dull),  January 24, 2010 9:33 AM Call placed to: Patient Details for Reason: Change meds Summary of Call: We rcv'd fax from pharmacy advising of a possible reaction between Amlodipine and simvastatin, per Dr.Lowne change to Lipitor 20 and  pt is to have labs redrawn in 3 months...SPk with pt and made her aware., and advised to stop taking the Simvastatin. Pt voiced understanding...Marland KitchenMarland KitchenRequested a 90 day supply of meds. Initial call taken by: Almeta Monas CMA Duncan Dull),  January 24, 2010 9:35 AM    New/Updated Medications: LIPITOR 20 MG TABS (ATORVASTATIN CALCIUM) 1 by mouth once daily Prescriptions: LIPITOR 20 MG TABS (ATORVASTATIN CALCIUM) 1 by mouth once daily  #90 x 0   Entered by:   Almeta Monas CMA (AAMA)   Authorized by:   Loreen Freud DO   Signed by:   Almeta Monas CMA (AAMA) on 01/24/2010   Method used:   [facility] [phone]* (retail)       [address]       [place], [place]  [address]       Ph: [phone]       Fax: [phone]   RxID:   [id number]

## 2010-03-25 NOTE — Medication Information (Signed)
Summary: Denial for Levocetirizine Dihydrochloride/[facility]  Denial for Levocetirizine Dihydrochloride/[facility]   Imported By: Lanelle Bal 01/30/2010 13:15:21  _____________________________________________________________________  External Attachment:    Type:   Image     Comment:   External Document

## 2010-03-25 NOTE — Progress Notes (Signed)
Summary: Medication refill  Phone Note Call from Patient Call back at [phone]   Caller: Patient Call For: Dr. Marina Goodell Reason for Call: Refill Medication Summary of Call: pt. needs a refill of Miralax....[facility] [place] [phone]  Initial call taken by: Karna Christmas,  July 24, 2009 9:54 AM  Follow-up for Phone Call        [facility] and [facility] [place].  Follow-up by: Milford Cage NCMA,  July 24, 2009 1:05 PM    Prescriptions: MIRALAX  POWD (POLYETHYLENE GLYCOL 3350) Use 17 grams in 8 0z of water.  #1 x 3   Entered by:   Milford Cage NCMA   Authorized by:   Hilarie Fredrickson MD   Signed by:   Milford Cage NCMA on 07/24/2009   Method used:   Printed then faxed to ...       [facility] [phone]* (retail)       [address]       [place], [place]  [address]       Ph: [phone]       Fax: [phone]   RxID:   [id number]

## 2010-03-25 NOTE — Letter (Signed)
Summary: [facility] Endocrinology & Diabetes  [facility] Endocrinology & Diabetes   Imported By: Lanelle Bal 01/06/2010 13:04:20  _____________________________________________________________________  External Attachment:    Type:   Image     Comment:   External Document

## 2010-03-25 NOTE — Medication Information (Signed)
Summary: Appeal Paperwork for Levocetirizine Diydrochloride  Appeal Paperwork for Levocetirizine Diydrochloride   Imported By: Lanelle Bal 01/30/2010 13:13:01  _____________________________________________________________________  External Attachment:    Type:   Image     Comment:   External Document

## 2010-03-25 NOTE — Progress Notes (Signed)
Summary: refill diovan  Phone Note Refill Request Message from:  Fax from Pharmacy on January 22, 2010 8:43 AM  Refills Requested: Medication #1:  DIOVAN 320 MG  TABS 1 by mouth once daily. NEEDS OFFICE VISIT IN APRIL. [facility] [phone]  Initial call taken by: Okey Regal Spring,  January 22, 2010 8:43 AM    New/Updated Medications: DIOVAN 320 MG  TABS (VALSARTAN) 1 by mouth once daily. Prescriptions: DIOVAN 320 MG  TABS (VALSARTAN) 1 by mouth once daily.  #30 x 2   Entered by:   Almeta Monas CMA (AAMA)   Authorized by:   Loreen Freud DO   Signed by:   Almeta Monas CMA (AAMA) on 01/22/2010   Method used:   Electronically to        [facility] [phone]* (retail)       [address]       [place], [place]  [address]       Ph: [phone]       Fax: [phone]   RxID:   [id number]

## 2010-03-25 NOTE — Progress Notes (Signed)
Summary: refill wants 90 day supply  Phone Note Refill Request Message from:  Patient on Jul 11, 2009 1:09 PM  Refills Requested: Medication #1:  OMEPRAZOLE 20 MG CPDR 1 by mouth once daily. patient requested refill but it was refilled for #30 -[place] she said dr Laury Axon told her she would refill for 90 days - [facility] [address]   Method Requested: Fax to Local Pharmacy Initial call taken by: Okey Regal Spring,  Jul 11, 2009 1:12 PM    Prescriptions: OMEPRAZOLE 20 MG CPDR (OMEPRAZOLE) 1 by mouth once daily  #90 x 3   Entered by:   Army Fossa CMA   Authorized by:   Loreen Freud DO   Signed by:   Army Fossa CMA on 07/11/2009   Method used:   Electronically to        [facility] [phone]* (retail)       [address]       [place], [place]  [address]       Ph: [phone]       Fax: [phone]   RxID:   [id number]

## 2010-03-25 NOTE — Letter (Signed)
Summary: Generic Letter  [facility] [place]  [address] [place], [place] [address]   Phone: [phone]  Fax: [phone]    01/08/2010  Part D Appeals and Grievance DEpartment ATTN; [phone] [address] [place], [place] [address]  Shikira Sommerfeld ID: [id number] DOB 2034-12-19 [address] [place], [place]  [id number]   To Whom it may concern:  Ms. Fern has tried the following medications: Zyrtec and Allegra which have not been therapeutic in treating the patient allergic rhinitis, seasonal allergy and cough.           Sincerely,       Loreen Freud, DO

## 2010-03-25 NOTE — Progress Notes (Signed)
----   Converted from flag ---- ---- 07/29/2009 2:46 PM, Karna Christmas wrote: pt. went to the pharmacy and the script is not there. Needs for [facility] too be [facility] [place]   # [phone] ------------------------------  Phone Note Call from Patient   Caller: Patient Summary of Call: [facility]axed [facility] [place] to above number.   Initial call taken by: Milford Cage NCMA,  July 29, 2009 4:11 PM     Appended Document:  Refaxed once again to [phone] called [facility] to confirm they received and they did.

## 2010-03-25 NOTE — Letter (Signed)
Summary: [facility] Endocrinology & Diabetes  [facility] Endocrinology & Diabetes   Imported By: Lanelle Bal 09/03/2009 10:15:50  _____________________________________________________________________  External Attachment:    Type:   Image     Comment:   External Document

## 2010-03-25 NOTE — Progress Notes (Signed)
Summary: neurontin refill   Phone Note Refill Request Call back at Home Phone [phone] Message from:  Patient  Refills Requested: Medication #1:  NEURONTIN 100 MG CAPS 1 by mouth three times a day   Supply Requested: 3 months Pt states that she is going out of town and  would like to have a 3 months supply of med filled at [facility]... Last OV 12-23-09, last filled 06-28-09 #90 2.........Marland KitchenFelecia Deloach CMA  January 09, 2010 9:13 AM    Follow-up for Phone Call        ok to do 3 month supply with 3 refillls Follow-up by: Loreen Freud DO,  January 09, 2010 11:14 AM    Prescriptions: NEURONTIN 100 MG CAPS (GABAPENTIN) 1 by mouth three times a day  #270 x 3   Entered by:   Doristine Devoid CMA   Authorized by:   Loreen Freud DO   Signed by:   Doristine Devoid CMA on 01/09/2010   Method used:   Electronically to        [facility] [phone]* (retail)       [address]       [place], [place]  [address]       Ph: [phone]       Fax: [phone]   RxID:   [id number]

## 2010-03-25 NOTE — Progress Notes (Signed)
Summary: refill  Phone Note Refill Request Message from:  Fax from Pharmacy on April 29, 2009 8:54 AM  Refills Requested: Medication #1:  DIOVAN 320 MG  TABS 1 by mouth once daily [facility] fax [phone]   Method Requested: Fax to Local Pharmacy Next Appointment Scheduled: no appt Initial call taken by: Barb Merino,  April 29, 2009 8:54 AM  Follow-up for Phone Call        Pt has not been seen since 12/09/2007. Army Fossa CMA  April 29, 2009 9:18 AM   Additional Follow-up for Phone Call Additional follow up Details #1::        Pt seen 10/10 2010--- ok to refill until April-- pt should have ov then Additional Follow-up by: Loreen Freud DO,  April 29, 2009 10:04 AM    New/Updated Medications: DIOVAN 320 MG  TABS (VALSARTAN) 1 by mouth once daily. NEEDS OFFICE VISIT IN APRIL. Prescriptions: DIOVAN 320 MG  TABS (VALSARTAN) 1 by mouth once daily. NEEDS OFFICE VISIT IN APRIL.  #30 x 0   Entered by:   Army Fossa CMA   Authorized by:   Loreen Freud DO   Signed by:   Army Fossa CMA on 04/29/2009   Method used:   Electronically to        [facility] [phone]* (retail)       [address]       [place], [place]  [address]       Ph: [phone]       Fax: [phone]   RxID:   [id number]

## 2010-03-25 NOTE — Progress Notes (Signed)
Summary: refills  Phone Note Refill Request Call back at Home Phone [phone] Message from:  Patient  Refills Requested: Medication #1:  DIOVAN 320 MG  TABS 1 by mouth once daily.   Supply Requested: 3 months 3  month [facility] [address]..............Marland KitchenFelecia Deloach CMA  January 23, 2010 12:22 PM      Prescriptions: DIOVAN 320 MG  TABS (VALSARTAN) 1 by mouth once daily.  #90 x 0   Entered by:   Jeremy Johann CMA   Authorized by:   Loreen Freud DO   Signed by:   Jeremy Johann CMA on 01/23/2010   Method used:   Faxed to ...       [facility] [phone]* (retail)       [address]       [place], [place]  [address]       Ph: [phone]       Fax: [phone]   RxID:   [id number]

## 2010-03-25 NOTE — Progress Notes (Signed)
Summary: RE-FAX RX  Phone Note From Pharmacy   Caller: [facility] [phone]* Summary of Call: PLEASE RESUBMIT RX FOR SIMVASTATIN. Initial call taken by: Lavell Islam,  September 20, 2009 11:21 AM    Prescriptions: SIMVASTATIN 40 MG TABS (SIMVASTATIN) 1 by mouth at bedtime  #90 Tablet x 2   Entered by:   Lucious Groves CMA   Authorized by:   Loreen Freud DO   Signed by:   Lucious Groves CMA on 09/20/2009   Method used:   Electronically to        [facility] [phone]* (retail)       [address]       [place], [place]  [address]       Ph: [phone]       Fax: [phone]   RxID:   [id number]

## 2010-03-25 NOTE — Progress Notes (Signed)
Summary: Diovan  Phone Note Call from Patient Call back at Home Phone [phone]   Caller: Patient Summary of Call: Pt called and stated that she needs a 3 month supply of her Diovan, and she will come in for an appt. She would not make an appt then. I told her since we had not seen her since 2009 we could only do a 30 day supply. She insisted that I ask you if you give her 3 months. Please advise.Army Fossa CMA  May 01, 2009 10:04 AM   Follow-up for Phone Call        90 day supply x1--- we saw her in 11/2008 Follow-up by: Loreen Freud DO,  May 01, 2009 10:15 AM  Additional Follow-up for Phone Call Additional follow up Details #1::        Pt is aware. Army Fossa CMA  May 01, 2009 10:39 AM     Prescriptions: ALENDRONATE SODIUM 70 MG  TABS (ALENDRONATE SODIUM) Take one tablet weekly.  #12 Tablet x 1   Entered by:   Army Fossa CMA   Authorized by:   Loreen Freud DO   Signed by:   Army Fossa CMA on 05/01/2009   Method used:   Electronically to        [facility] [phone]* (retail)       [address]       [place], [place]  [address]       Ph: [phone]       Fax: [phone]   RxID:   [id number] DIOVAN 320 MG  TABS (VALSARTAN) 1 by mouth once daily. NEEDS OFFICE VISIT IN APRIL.  #90 x 0   Entered by:   Army Fossa CMA   Authorized by:   Loreen Freud DO   Signed by:   Army Fossa CMA on 05/01/2009   Method used:   Electronically to        [facility]S  [facility] [phone]* (retail)       [address]       [place], [place]  [address]       Ph: [phone]       Fax: [phone]   RxID:   [id number]

## 2010-03-25 NOTE — Progress Notes (Signed)
Summary: rx ?'s  Phone Note Call from Patient Call back at [phone]   Caller: husband Call For: Dr. Marina Goodell Reason for Call: Talk to Nurse Summary of Call: questions about rx's for Polyethylene and Glycol Initial call taken by: Vallarie Mare,  July 26, 2009 8:46 AM  Follow-up for Phone Call        Left message for patient to call back Darcey Nora RN, [facility]  July 26, 2009 9:04 AM  Patient  needs rx sent again to the [facility].  Rx sent again. Follow-up by: Darcey Nora RN, CGRN,  July 26, 2009 10:29 AM    Prescriptions: MIRALAX  POWD (POLYETHYLENE GLYCOL 3350) Use 17 grams in 8 0z of water.  #527 gm x 3   Entered by:   Darcey Nora RN, CGRN   Authorized by:   Hilarie Fredrickson MD   Signed by:   Darcey Nora RN, CGRN on 07/26/2009   Method used:   Printed then faxed to ...       [facility] [phone]* (retail)       [address]       [place], [place]  [address]       Ph: [phone]       Fax: [phone]   RxID:   [id number]

## 2010-03-25 NOTE — Letter (Signed)
Summary: MMSE Form  MMSE Form   Imported By: Lanelle Bal 12/31/2009 12:10:15  _____________________________________________________________________  External Attachment:    Type:   Image     Comment:   External Document

## 2010-03-25 NOTE — Progress Notes (Signed)
Summary: prior auth  Phone Note Refill Request Message from:  Fax from Pharmacy on December 23, 2009 5:01 PM  Refills Requested: Medication #1:  FLONASE 50 MCG/ACT SUSP 2 sprays each nostril once daily prior auth [id number] - - fexofenadine- nasonex  Initial call taken by: Okey Regal Spring,  December 23, 2009 5:03 PM  Follow-up for Phone Call        This doesn't make any sense---- flonase is generic and is a nose spray,   fexafenadine is allegra, and nasonex is brand name Follow-up by: Loreen Freud DO,  December 23, 2009 10:07 PM  Additional Follow-up for Phone Call Additional follow up Details #1::        Prior Auth is for the Xyzal. They will send approval or disapproval via Fax... Additional Follow-up by: Almeta Monas CMA Duncan Dull),  December 25, 2009 4:21 PM

## 2010-03-25 NOTE — Progress Notes (Signed)
Summary: wANTS RF ON COUGH MEDS-lmom  Phone Note Call from Patient   Caller: Patient Call For: Loreen Freud DO Details for Reason: wants Cherutussin RF Summary of Call: call from patient and she feels better but she wantS 2 refills of the cough meds, said she will be leaving for out of town and wants [facility], pt will be gone for 2 months c/b [phone].Marland KitchenMarland KitchenPlease advise  Initial call taken by: Almeta Monas CMA Duncan Dull),  January 14, 2010 3:32 PM  Follow-up for Phone Call        She can not stay on cough med---if cough does not completely resolve she would need to be seen refill x1 only ---for 6 oz Follow-up by: Loreen Freud DO,  January 14, 2010 4:04 PM  Additional Follow-up for Phone Call Additional follow up Details #1::        left message on machine .....Marland KitchenMarland KitchenDoristine Devoid CMA  January 14, 2010 4:35 PM   spoke w/ patient informed prescription sent to pharmacy...Marland KitchenMarland KitchenDoristine Devoid CMA  January 14, 2010 4:52 PM     Prescriptions: CHERATUSSIN AC 100-10 MG/5ML SYRP (GUAIFENESIN-CODEINE) 1-2 tsp by mouth at bedtime as needed  #6 oz x 0   Entered by:   Doristine Devoid CMA   Authorized by:   Loreen Freud DO   Signed by:   Doristine Devoid CMA on 01/14/2010   Method used:   Printed then faxed to ...       [facility] [phone]* (retail)       [address]       [place], [place]  [address]       Ph: [phone]       Fax: [phone]   RxID:   [id number]

## 2010-03-25 NOTE — Progress Notes (Signed)
Summary: prior auth APPEAL NOT NEEDED  Phone Note Refill Request   Refills Requested: Medication #1:  XYZAL 5 MG TABS 1 by mouth once daily prior auth - [id number] --- [facility] [address]  Initial call taken by: Okey Regal Spring,  January 02, 2010 9:08 AM  Follow-up for Phone Call        Prior auth denied because the requested drug is not prescribed for a use approved by the FDA oe supported by certain references, the requested drug is not considered a Part D drug and is ot covered by the plan..........Marland KitchenFelecia Deloach CMA  January 02, 2010 9:27 AM   appeal faxed and mailed awaiting response.........Marland KitchenFelecia Deloach CMA  January 08, 2010 3:27 PM   Additional Follow-up for Phone Call Additional follow up Details #1::        called checked status of appeal, still awaiting response......Marland KitchenFelecia Deloach CMA  January 15, 2010 1:07 PM  Appeal paperwork,faxed and mailed.............Marland KitchenFelecia Deloach CMA  January 21, 2010 9:13 AM  Appeal representative form faxed back awaiting response....................Marland KitchenFelecia Deloach CMA  January 23, 2010 9:24 AM   Paper work sent back stating Pt signature required. spoke with pt who states that she is doing much better and does not feel that she need med. Discuss briefly with dr Laury Axon can discontinue working on [employer] for now.............Marland KitchenFelecia Deloach CMA  January 23, 2010 12:51 PM

## 2010-03-25 NOTE — Assessment & Plan Note (Signed)
Summary: see doctor//lch   Vital Signs:  Patient profile:   76 year old female Weight:      139 pounds Pulse rate:   96 / minute Pulse rhythm:   regular BP sitting:   126 / 80  (left arm) Cuff size:   regular  Vitals Entered By: Army Fossa CMA (Jun 28, 2009 9:50 AM) CC: Pt states for 3 days she has been dizzy, worse when she lays down. Discuss fingernails   History of Present Illness:  Hypertension follow-up      This is a 76 year old woman who presents for Hypertension follow-up.  Pt unable to walk secondary to burning in feet.   .  The patient complains of lightheadedness, but denies urinary frequency, headaches, edema, impotence, rash, and fatigue.  The patient denies the following associated symptoms: chest pain, chest pressure, exercise intolerance, dyspnea, palpitations, syncope, leg edema, and pedal edema.  Compliance with medications (by patient report) has been near 100%.  The patient reports that dietary compliance has been good.  The patient reports no exercise.  Adjunctive measures currently used by the patient include salt restriction.    Type 1 diabetes mellitus follow-up      The patient is also here for Type 2 diabetes mellitus follow-up.  The patient complains of numbness of extremities, but denies polyuria, polydipsia, blurred vision, self managed hypoglycemia, hypoglycemia requiring help, weight loss, and weight gain.  Other symptoms include neuropathic pain.  The patient denies the following symptoms: chest pain, vomiting, orthostatic symptoms, poor wound healing, intermittent claudication, vision loss, and foot ulcer.  Since the last visit the patient reports good dietary compliance, compliance with medications, not exercising regularly, and monitoring blood glucose.  Since the last visit, the patient reports having had eye care by an ophthalmologist and foot care by a podiatrist.  Complications from diabetes include peripheral neuropathy.    Preventive  Screening-Counseling & Management  Alcohol-Tobacco     Alcohol drinks/day: 0     Smoking Status: never     Passive Smoke Exposure: no  Caffeine-Diet-Exercise     Caffeine use/day: 0     Does Patient Exercise: no  Current Medications (verified): 1)  Simvastatin 40 Mg Tabs (Simvastatin) .Marland Kitchen.. 1 By Mouth At Bedtime 2)  Alendronate Sodium 70 Mg  Tabs (Alendronate Sodium) .... Take One Tablet Weekly. 3)  Diovan 320 Mg  Tabs (Valsartan) .Marland Kitchen.. 1 By Mouth Once Daily. Needs Office Visit in April. 4)  Actos 15 Mg  Tabs (Pioglitazone Hcl) .Marland Kitchen.. 1 By Mouth Once Daily 5)  Amaryl 1 Mg  Tabs (Glimepiride) .... 2 By Mouth in The Afternoon 6)  Mvi .Marland Kitchen.. 3  By Mouth Once Daily 7)  Lovaza 1 Gm  Caps (Omega-3-Acid Ethyl Esters) .... Take Two Capsules Twice A Day 8)  Niaspan 500 Mg Cr-Tabs (Niacin (Antihyperlipidemic)) .... Take 1 Tablet At Bedtime 9)  Janumet 50-1000 Mg Tabs (Sitagliptin-Metformin Hcl) .Marland Kitchen.. 1 By Mouth Once Daily 10)  Prozac 10 Mg Caps (Fluoxetine Hcl) .Marland Kitchen.. 1 By Mouth Once Daily 11)  Flexeril 10 Mg Tabs (Cyclobenzaprine Hcl) .Marland Kitchen.. 1 By Mouth Three Times A Day 12)  Levemir 100 Unit/ml Soln (Insulin Detemir) .... 5 Units At Bedtime 13)  Aspirin 81 Mg  Tabs (Aspirin) .Marland Kitchen.. 1 By Mouth Once Daily 14)  One-A-Day Extras Antioxidant  Caps (Multiple Vitamins-Minerals) .... Once Daily 15)  B Complex-C  Tabs (B Complex-C) .... Once Daily 16)  Miralax  Powd (Polyethylene Glycol 3350) .... Use 17 Grams in  8 0z of Water. 17)  Norvasc 2.5 Mg Tabs (Amlodipine Besylate) .Marland Kitchen.. 1 By Mouth Once Daily 18)  Neurontin 100 Mg Caps (Gabapentin) .Marland Kitchen.. 1 By Mouth Three Times A Day  Allergies (verified): 1)  ! Sulfa 2)  ! Pcn  Past History:  Past medical, surgical, family and social histories (including risk factors) reviewed for relevance to current acute and chronic problems.  Past Medical History: Reviewed history from 08/05/2006 and no changes required. Diabetes mellitus, type  II Hyperlipidemia Hypertension  Past Surgical History: Reviewed history from 01/24/2008 and no changes required. Appendectomy surgical sterilization 40 yrs ago  Family History: Reviewed history from 12/19/2008 and no changes required. Family History Depression laryngeal CA--F Family History Diabetes 1st degree relative---M  Social History: Reviewed history from 12/09/2007 and no changes required. Married Never Smoked Drug use-no Regular exercise-no  Review of Systems      See HPI  Physical Exam  General:  Well-developed,well-nourished,in no acute distress; alert,appropriate and cooperative throughout examination Neck:  No deformities, masses, or tenderness noted. Lungs:  Normal respiratory effort, chest expands symmetrically. Lungs are clear to auscultation, no crackles or wheezes. Heart:  normal rate and no murmur.   Psych:  Oriented X3 and normally interactive.     Impression & Recommendations:  Problem # 1:  DIABETIC PERIPHERAL NEUROPATHY (ICD-250.60) gapepentin 100mg  by mouth three times a day ---start slow--- 1 by mouth at bedtime for few days then two times a day then three times a day  Her updated medication list for this problem includes:    Diovan 320 Mg Tabs (Valsartan) .Marland Kitchen... 1 by mouth once daily. needs office visit in april.    Actos 15 Mg Tabs (Pioglitazone hcl) .Marland Kitchen... 1 by mouth once daily    Amaryl 1 Mg Tabs (Glimepiride) .Marland Kitchen... 2 by mouth in the afternoon    Janumet 50-1000 Mg Tabs (Sitagliptin-metformin hcl) .Marland Kitchen... 1 by mouth once daily    Levemir 100 Unit/ml Soln (Insulin detemir) .Marland KitchenMarland KitchenMarland KitchenMarland Kitchen 5 units at bedtime    Aspirin 81 Mg Tabs (Aspirin) .Marland Kitchen... 1 by mouth once daily  Orders: Venipuncture (16109) TLB-Lipid Panel (80061-LIPID) TLB-BMP (Basic Metabolic Panel-BMET) (80048-METABOL) TLB-CBC Platelet - w/Differential (85025-CBCD) TLB-Hepatic/Liver Function Pnl (80076-HEPATIC) TLB-TSH (Thyroid Stimulating Hormone) (84443-TSH) TLB-B12 + Folate Pnl  [phone]) T-Vitamin D (25-Hydroxy) [phone])  Labs Reviewed: Creat: 0.7 (12/17/2008)    Reviewed HgBA1c results: 6.7 (12/17/2008)  6.6 (12/29/2007)  Problem # 2:  HYPERTENSION (ICD-401.9)  Her updated medication list for this problem includes:    Diovan 320 Mg Tabs (Valsartan) .Marland Kitchen... 1 by mouth once daily. needs office visit in april.    Norvasc 2.5 Mg Tabs (Amlodipine besylate) .Marland Kitchen... 1 by mouth once daily  Orders: Venipuncture (78469) TLB-Lipid Panel (80061-LIPID) TLB-BMP (Basic Metabolic Panel-BMET) (80048-METABOL) TLB-CBC Platelet - w/Differential (85025-CBCD) TLB-Hepatic/Liver Function Pnl (80076-HEPATIC) TLB-TSH (Thyroid Stimulating Hormone) (84443-TSH) TLB-B12 + Folate Pnl (62952_84132-G40/NUU) T-Vitamin D (25-Hydroxy) (72536-64403)  BP today: 126/80 Prior BP: 138/70 (12/19/2008)  Prior 10 Yr Risk Heart Disease: 17 % (01/12/2007)  Labs Reviewed: K+: 3.9 (12/17/2008) Creat: : 0.7 (12/17/2008)   Chol: 112 (12/17/2008)   HDL: 61.90 (12/17/2008)   LDL: 40 (12/17/2008)   TG: 53.0 (12/17/2008)  Problem # 3:  HYPERLIPIDEMIA (ICD-272.4)  Her updated medication list for this problem includes:    Simvastatin 40 Mg Tabs (Simvastatin) .Marland Kitchen... 1 by mouth at bedtime    Lovaza 1 Gm Caps (Omega-3-acid ethyl esters) .Marland Kitchen... Take two capsules twice a day    Niaspan 500 Mg Cr-tabs (Niacin (antihyperlipidemic)) .Marland Kitchen... Take  1 tablet at bedtime  Orders: Venipuncture (16109) TLB-Lipid Panel (80061-LIPID) TLB-BMP (Basic Metabolic Panel-BMET) (80048-METABOL) TLB-CBC Platelet - w/Differential (85025-CBCD) TLB-Hepatic/Liver Function Pnl (80076-HEPATIC) TLB-TSH (Thyroid Stimulating Hormone) (84443-TSH) TLB-B12 + Folate Pnl [phone]) T-Vitamin D (25-Hydroxy) [phone])  Labs Reviewed: SGOT: 25 (12/17/2008)   SGPT: 22 (12/17/2008)  Lipid Goals: Chol Goal: 200 (01/12/2007)   HDL Goal: 40 (01/12/2007)   LDL Goal: 100 (01/12/2007)   TG Goal: 150  (01/12/2007)  Prior 10 Yr Risk Heart Disease: 17 % (01/12/2007)   HDL:61.90 (12/17/2008), 45.3 (12/15/2007)  LDL:40 (12/17/2008), 39 (12/15/2007)  Chol:112 (12/17/2008), 95 (12/15/2007)  Trig:53.0 (12/17/2008), 54 (12/15/2007)  Problem # 4:  DIABETES MELLITUS, TYPE II (ICD-250.00) per endo Her updated medication list for this problem includes:    Diovan 320 Mg Tabs (Valsartan) .Marland Kitchen... 1 by mouth once daily. needs office visit in april.    Actos 15 Mg Tabs (Pioglitazone hcl) .Marland Kitchen... 1 by mouth once daily    Amaryl 1 Mg Tabs (Glimepiride) .Marland Kitchen... 2 by mouth in the afternoon    Janumet 50-1000 Mg Tabs (Sitagliptin-metformin hcl) .Marland Kitchen... 1 by mouth once daily    Levemir 100 Unit/ml Soln (Insulin detemir) .Marland KitchenMarland KitchenMarland KitchenMarland Kitchen 5 units at bedtime    Aspirin 81 Mg Tabs (Aspirin) .Marland Kitchen... 1 by mouth once daily  Problem # 5:  OSTEOPOROSIS NOS (ICD-733.00)  Her updated medication list for this problem includes:    Alendronate Sodium 70 Mg Tabs (Alendronate sodium) .Marland Kitchen... Take one tablet weekly.  Orders: Venipuncture (78469) TLB-Lipid Panel (80061-LIPID) TLB-BMP (Basic Metabolic Panel-BMET) (80048-METABOL) TLB-CBC Platelet - w/Differential (85025-CBCD) TLB-Hepatic/Liver Function Pnl (80076-HEPATIC) TLB-TSH (Thyroid Stimulating Hormone) (84443-TSH) TLB-B12 + Folate Pnl [phone]) T-Vitamin D (25-Hydroxy) (72536-64403)  Bone Density: osteoporosis (01/04/2008) Vit D:33 (12/17/2008), 42 (12/15/2007)  Problem # 6:  THYROID NODULE, RIGHT (ICD-241.0)  Orders: Venipuncture (47425) TLB-Lipid Panel (80061-LIPID) TLB-BMP (Basic Metabolic Panel-BMET) (80048-METABOL) TLB-CBC Platelet - w/Differential (85025-CBCD) TLB-Hepatic/Liver Function Pnl (80076-HEPATIC) TLB-TSH (Thyroid Stimulating Hormone) (84443-TSH) TLB-B12 + Folate Pnl (95638_75643-P29/JJO) T-Vitamin D (25-Hydroxy) (84166-06301)  Complete Medication List: 1)  Simvastatin 40 Mg Tabs (Simvastatin) .Marland Kitchen.. 1 by mouth at bedtime 2)  Alendronate Sodium 70 Mg  Tabs (Alendronate sodium) .... Take one tablet weekly. 3)  Diovan 320 Mg Tabs (Valsartan) .Marland Kitchen.. 1 by mouth once daily. needs office visit in april. 4)  Actos 15 Mg Tabs (Pioglitazone hcl) .Marland Kitchen.. 1 by mouth once daily 5)  Amaryl 1 Mg Tabs (Glimepiride) .... 2 by mouth in the afternoon 6)  Mvi  .Marland Kitchen.. 3  by mouth once daily 7)  Lovaza 1 Gm Caps (Omega-3-acid ethyl esters) .... Take two capsules twice a day 8)  Niaspan 500 Mg Cr-tabs (Niacin (antihyperlipidemic)) .... Take 1 tablet at bedtime 9)  Janumet 50-1000 Mg Tabs (Sitagliptin-metformin hcl) .Marland Kitchen.. 1 by mouth once daily 10)  Prozac 10 Mg Caps (Fluoxetine hcl) .Marland Kitchen.. 1 by mouth once daily 11)  Flexeril 10 Mg Tabs (Cyclobenzaprine hcl) .Marland Kitchen.. 1 by mouth three times a day 12)  Levemir 100 Unit/ml Soln (Insulin detemir) .... 5 units at bedtime 13)  Aspirin 81 Mg Tabs (Aspirin) .Marland Kitchen.. 1 by mouth once daily 14)  One-a-day Extras Antioxidant Caps (Multiple vitamins-minerals) .... Once daily 15)  B Complex-c Tabs (B complex-c) .... Once daily 16)  Miralax Powd (Polyethylene glycol 3350) .... Use 17 grams in 8 0z of water. 17)  Norvasc 2.5 Mg Tabs (Amlodipine besylate) .Marland Kitchen.. 1 by mouth once daily 18)  Neurontin 100 Mg Caps (Gabapentin) .Marland Kitchen.. 1 by mouth three times a day  Patient Instructions: 1)  Please schedule a follow-up appointment in 3 months .  Prescriptions: NEURONTIN 100 MG CAPS (GABAPENTIN) 1 by mouth three times a day  #90 x 2   Entered and Authorized by:   Loreen Freud DO   Signed by:   Loreen Freud DO on 06/28/2009   Method used:   [facility]        [facility] [phone]* (retail)       [address]       [place], [place]  [address]       Ph: [phone]       Fax: [phone]   RxID:   [id number]

## 2010-03-25 NOTE — Assessment & Plan Note (Signed)
Summary: cough for one month(has been out of state)///sph   Vital Signs:  Patient profile:   76 year old female Height:      58 inches (147.32 cm) Weight:      135.0 pounds (61.36 kg) BMI:     28.32 O2 Sat:      96 % on Room air Temp:     98.6 degrees F (37.00 degrees C) oral Pulse rate:   111 / minute BP sitting:   130 / 70  (right arm) Cuff size:   regular  Vitals Entered By: Almeta Monas CMA Duncan Dull) (December 23, 2009 2:14 PM)  O2 Flow:  Room air CC: c/o cough x59mo, Cough   History of Present Illness:  Cough      This is a 76 year old woman who presents with Cough.  The symptoms began 4 weeks ago.  Pt saw Dr in [place] and saw ENT here.  Pt was given something for 1 week in [place] and Dr Ezzard Standing said she was fine and told her to gargle and take cough syrup.  The patient reports non-productive cough, but denies productive cough, pleuritic chest pain, shortness of breath, wheezing, exertional dyspnea, fever, hemoptysis, and malaise.  Associated symtpoms include chronic rhinitis.  The patient denies the following symptoms: cold/URI symptoms, sore throat, nasal congestion, weight loss, acid reflux symptoms, and peripheral edema.  The cough is worse with lying down.  Ineffective prior treatments have included OTC cough medication.    Pt also c/o memory loss that has been progessively getting worse.   Current Medications (verified): 1)  Simvastatin 40 Mg Tabs (Simvastatin) .Marland Kitchen.. 1 By Mouth At Bedtime 2)  Alendronate Sodium 70 Mg  Tabs (Alendronate Sodium) .... Take One Tablet Weekly. 3)  Diovan 320 Mg  Tabs (Valsartan) .Marland Kitchen.. 1 By Mouth Once Daily. Needs Office Visit in April. 4)  Amaryl 1 Mg  Tabs (Glimepiride) .... 2 By Mouth in The Afternoon 5)  Mvi .Marland Kitchen.. 3  By Mouth Once Daily 6)  Lovaza 1 Gm  Caps (Omega-3-Acid Ethyl Esters) .... Take Two Capsules Twice A Day 7)  Niaspan 1000 Mg Cr-Tabs (Niacin (Antihyperlipidemic)) .... Take 1 Tablet At Bedtime 8)  Janumet 50-1000 Mg Tabs  (Sitagliptin-Metformin Hcl) .Marland Kitchen.. 1 By Mouth Once Daily 9)  Prozac 10 Mg Caps (Fluoxetine Hcl) .Marland Kitchen.. 1 By Mouth Once Daily 10)  Flexeril 10 Mg Tabs (Cyclobenzaprine Hcl) .Marland Kitchen.. 1 By Mouth Three Times A Day 11)  Levemir 100 Unit/ml Soln (Insulin Detemir) .... 5 Units At Bedtime 12)  Aspirin 81 Mg  Tabs (Aspirin) .Marland Kitchen.. 1 By Mouth Once Daily 13)  One-A-Day Extras Antioxidant  Caps (Multiple Vitamins-Minerals) .... Once Daily 14)  B Complex-C  Tabs (B Complex-C) .... Once Daily 15)  Miralax  Powd (Polyethylene Glycol 3350) .... Use 17 Grams in 8 0z of Water. 16)  Neurontin 100 Mg Caps (Gabapentin) .Marland Kitchen.. 1 By Mouth Three Times A Day 17)  Omeprazole 20 Mg Cpdr (Omeprazole) .Marland Kitchen.. 1 By Mouth Once Daily 18)  Xyzal 5 Mg Tabs (Levocetirizine Dihydrochloride) .Marland Kitchen.. 1 By Mouth Once Daily 19)  Flonase 50 Mcg/act Susp (Fluticasone Propionate) .... 2 Sprays Each Nostril Once Daily 20)  Cheratussin Ac 100-10 Mg/73ml Syrp (Guaifenesin-Codeine) .Marland Kitchen.. 1-2 Tsp By Mouth At Bedtime As Needed  Allergies (verified): 1)  ! Sulfa 2)  ! Pcn  Past History:  Past medical, surgical, family and social histories (including risk factors) reviewed for relevance to current acute and chronic problems.  Past Medical History: Reviewed history  from 08/05/2006 and no changes required. Diabetes mellitus, type II Hyperlipidemia Hypertension  Past Surgical History: Reviewed history from 01/24/2008 and no changes required. Appendectomy surgical sterilization 40 yrs ago  Family History: Reviewed history from 12/19/2008 and no changes required. Family History Depression laryngeal CA--F Family History Diabetes 1st degree relative---M  Social History: Reviewed history from 12/09/2007 and no changes required. Married Never Smoked Drug use-no Regular exercise-no  Review of Systems      See HPI  Physical Exam  General:  Well-developed,well-nourished,in no acute distress; alert,appropriate and cooperative throughout  examination Mouth:  Oral mucosa and oropharynx without lesions or exudates.  Teeth in good repair. Neck:  No deformities, masses, or tenderness noted. Lungs:  Normal respiratory effort, chest expands symmetrically. Lungs are clear to auscultation, no crackles or wheezes. Heart:  normal rate.   Neurologic:  No cranial nerve deficits noted. Station and gait are normal. Plantar reflexes are down-going bilaterally. DTRs are symmetrical throughout. Sensory, motor and coordinative functions appear intact. MMSE 30/30 Skin:  Intact without suspicious lesions or rashes Cervical Nodes:  No lymphadenopathy noted Psych:  Cognition and judgment appear intact. Alert and cooperative with normal attention span and concentration. No apparent delusions, illusions, hallucinations   Impression & Recommendations:  Problem # 1:  COUGH (ICD-786.2) pt taking omeprazole xyzal daily refill nasal spray  Problem # 2:  MEMORY LOSS (ICD-780.93)  Orders: Venipuncture (81191) TLB-B12 + Folate Pnl (47829_56213-Y86/VHQ) TLB-TSH (Thyroid Stimulating Hormone) (84443-TSH) TLB-BMP (Basic Metabolic Panel-BMET) (80048-METABOL) TLB-CBC Platelet - w/Differential (85025-CBCD) TLB-Hepatic/Liver Function Pnl (80076-HEPATIC)  Complete Medication List: 1)  Simvastatin 40 Mg Tabs (Simvastatin) .Marland Kitchen.. 1 by mouth at bedtime 2)  Alendronate Sodium 70 Mg Tabs (Alendronate sodium) .... Take one tablet weekly. 3)  Diovan 320 Mg Tabs (Valsartan) .Marland Kitchen.. 1 by mouth once daily. needs office visit in april. 4)  Amaryl 1 Mg Tabs (Glimepiride) .... 2 by mouth in the afternoon 5)  Mvi  .Marland Kitchen.. 3  by mouth once daily 6)  Lovaza 1 Gm Caps (Omega-3-acid ethyl esters) .... Take two capsules twice a day 7)  Niaspan 1000 Mg Cr-tabs (niacin (antihyperlipidemic))  .... Take 1 tablet at bedtime 8)  Janumet 50-1000 Mg Tabs (Sitagliptin-metformin hcl) .Marland Kitchen.. 1 by mouth once daily 9)  Prozac 10 Mg Caps (Fluoxetine hcl) .Marland Kitchen.. 1 by mouth once daily 10)  Flexeril  10 Mg Tabs (Cyclobenzaprine hcl) .Marland Kitchen.. 1 by mouth three times a day 11)  Levemir 100 Unit/ml Soln (Insulin detemir) .... 5 units at bedtime 12)  Aspirin 81 Mg Tabs (Aspirin) .Marland Kitchen.. 1 by mouth once daily 13)  One-a-day Extras Antioxidant Caps (Multiple vitamins-minerals) .... Once daily 14)  B Complex-c Tabs (B complex-c) .... Once daily 15)  Miralax Powd (Polyethylene glycol 3350) .... Use 17 grams in 8 0z of water. 16)  Neurontin 100 Mg Caps (Gabapentin) .Marland Kitchen.. 1 by mouth three times a day 17)  Omeprazole 20 Mg Cpdr (Omeprazole) .Marland Kitchen.. 1 by mouth once daily 18)  Xyzal 5 Mg Tabs (Levocetirizine dihydrochloride) .Marland Kitchen.. 1 by mouth once daily 19)  Flonase 50 Mcg/act Susp (Fluticasone propionate) .... 2 sprays each nostril once daily 20)  Cheratussin Ac 100-10 Mg/54ml Syrp (Guaifenesin-codeine) .Marland Kitchen.. 1-2 tsp by mouth at bedtime as needed Prescriptions: CHERATUSSIN AC 100-10 MG/5ML SYRP (GUAIFENESIN-CODEINE) 1-2 tsp by mouth at bedtime as needed  #6 oz x 0   Entered and Authorized by:   Loreen Freud DO   Signed by:   Loreen Freud DO on 12/23/2009   Method used:   Print  then Give to Patient   RxID:   [id number] [facility] 50 MCG/ACT SUSP (FLUTICASONE PROPIONATE) 2 sprays each nostril once daily  #1 x 5   Entered and Authorized by:   Loreen Freud DO   Signed by:   Loreen Freud DO on 12/23/2009   Method used:   [facility]        [facility] [phone]* (retail)       [address]       [place], [place]  [address]       Ph: [phone]       Fax: [phone]   RxID:   [id number] XYZAL 5 MG TABS (LEVOCETIRIZINE DIHYDROCHLORIDE) 1 by mouth once daily  #30 x 11   Entered and Authorized by:   Loreen Freud DO   Signed by:   Loreen Freud DO on 12/23/2009   Method used:   Electronically to        [facility] [phone]* (retail)       [address]       [place], [place]  [address]       Ph: [phone]       Fax: [phone]   RxID:    [id number]    Orders Added: 1)  Venipuncture [20254] 2)  TLB-B12 + Folate Pnl [82746_82607-B12/FOL] 3)  TLB-TSH (Thyroid Stimulating Hormone) [84443-TSH] 4)  TLB-BMP (Basic Metabolic Panel-BMET) [80048-METABOL] 5)  TLB-CBC Platelet - w/Differential [85025-CBCD] 6)  TLB-Hepatic/Liver Function Pnl [80076-HEPATIC] 7)  Est. Patient Level III [27062]

## 2010-03-25 NOTE — Progress Notes (Signed)
Summary: refill  Phone Note Refill Request Call back at [phone] Message from:  Patient  Refills Requested: Medication #1:  priolosec 20mg  1 tab qd Pt left VM that she is currently in [place] and needs a [phone]. pt would like a call once rx sent in....Marland KitchenMarland KitchenFelecia Deloach CMA  Jul 10, 2009 4:13 PM    Follow-up for Phone Call        last OV 06-28-09, not on med list pls advise....Marland KitchenMarland KitchenFelecia Deloach CMA  Jul 10, 2009 4:14 PM   pt aware out of office until 06-11-09...Marland KitchenMarland KitchenFelecia Deloach CMA  Jul 10, 2009 4:15 PM   Additional Follow-up for Phone Call Additional follow up Details #1::        rx printed out Additional Follow-up by: Loreen Freud DO,  Jul 11, 2009 8:19 AM    Additional Follow-up for Phone Call Additional follow up Details #2::    I informed pt that her med was called in. Army Fossa CMA  Jul 11, 2009 8:26 AM   New/Updated Medications: OMEPRAZOLE 20 MG CPDR (OMEPRAZOLE) 1 by mouth once daily Prescriptions: OMEPRAZOLE 20 MG CPDR (OMEPRAZOLE) 1 by mouth once daily  #30 x 11   Entered and Authorized by:   Loreen Freud DO   Signed by:   Loreen Freud DO on 07/11/2009   Method used:   Printed then faxed to ...       [facility] [phone]* (retail)       [address]       [place], [place]  [address]       Ph: [phone]       Fax: [phone]   RxID:   [id number]

## 2010-03-25 NOTE — Progress Notes (Signed)
Summary: refill  Phone Note Refill Request Message from:  Patient on Jul 18, 2009 9:37 AM  Refills Requested: Medication #1:  DIOVAN 320 MG  TABS 1 by mouth once daily. NEEDS OFFICE VISIT IN APRIL. patient wants 90 day supply  [facility] [phone]  --- [place] in [place]   Method Requested: Telephone to Pharmacy Initial call taken by: Okey Regal [place],  Jul 18, 2009 9:39 AM    Prescriptions: DIOVAN 320 MG  TABS (VALSARTAN) 1 by mouth once daily. NEEDS OFFICE VISIT IN APRIL.  #90 x 0   Entered by:   Army Fossa CMA   Authorized by:   Loreen Freud DO   Signed by:   Army Fossa CMA on 07/18/2009   Method used:   Printed then faxed to ...       [facility] [phone]* (retail)       [address]       [place], [place]  [address]       Ph: [phone]       Fax: [phone]   RxID:   [id number]

## 2010-03-25 NOTE — Progress Notes (Signed)
Summary: refill  Phone Note Call from Patient Call back at [phone]   Caller: Patient Call For: Dr. Marina Goodell Reason for Call: Refill Medication Summary of Call: asked for a refill for a laxative... [phone]... pt says last time, med order was sent in wothout a quantity Initial call taken by: Vallarie Mare,  July 29, 2009 9:27 AM  Follow-up for Phone Call        Called patient L/M stating that [facility] [place] by [facility] on 07/26/09.  If she had any questions or concerns to call me back.  Milford Cage [facility]  July 29, 2009 1:48 PM

## 2010-03-25 NOTE — Miscellaneous (Signed)
Summary: PT Discharge/[facility]  PT Discharge/[facility]   Imported By: Lanelle Bal 04/13/2009 08:08:51  _____________________________________________________________________  External Attachment:    Type:   Image     Comment:   External Document

## 2010-03-27 NOTE — Medication Information (Signed)
Summary: Appeal for Xyzal Withdrawn/[facility]  Appeal for Xyzal Withdrawn/[facility]   Imported By: Lanelle Bal 02/03/2010 11:23:05  _____________________________________________________________________  External Attachment:    Type:   Image     Comment:   External Document

## 2010-03-27 NOTE — Medication Information (Signed)
Summary: Interaction with Simvastatin & Amlodipine/[facility]  [facility]action with [facility]   Imported By: Lanelle Bal 02/03/2010 11:21:12  _____________________________________________________________________  External Attachment:    Type:   Image     Comment:   External Document

## 2010-04-14 ENCOUNTER — Telehealth (INDEPENDENT_AMBULATORY_CARE_PROVIDER_SITE_OTHER): Payer: Self-pay | Admitting: *Deleted

## 2010-04-22 NOTE — Progress Notes (Signed)
Summary: REfill  Phone Note Refill Request Message from:  Patient on April 14, 2010 3:35 PM  Refills Requested: Medication #1:  OMEPRAZOLE 20 MG CPDR 1 by mouth once daily   Dosage confirmed as above?Dosage Confirmed   Supply Requested: 3 months  Medication #2:  PROZAC 10 MG CAPS 1 by mouth once daily   Dosage confirmed as above?Dosage Confirmed   Supply Requested: 3 months  Medication #3:  NEURONTIN 100 MG CAPS 1 by mouth three times a day   Dosage confirmed as above?Dosage Confirmed   Supply Requested: 3 months  Medication #4:  DIOVAN 320 MG  TABS 1 by mouth once daily.   Dosage confirmed as above?Dosage Confirmed   Supply Requested: 3 months Simvistatin- 3 month supply Alendronate Sodium 70 mg -3 month supply    New insurance [employer]-id #[id number]  Fax # [phone]  Next Appointment Scheduled: 3.6.12 Initial call taken by: Lavell Islam,  April 14, 2010 3:41 PM    Prescriptions: OMEPRAZOLE 20 MG CPDR (OMEPRAZOLE) 1 by mouth once daily  #90 x 0   Entered by:   Almeta Monas CMA (AAMA)   Authorized by:   Loreen Freud DO   Signed by:   Almeta Monas CMA (AAMA) on 04/14/2010   Method used:   Faxed to ...       [facility] (mail-order)             , [place]         Ph: [phone]       Fax: [phone]   RxID:   [id number] NEURONTIN 100 MG CAPS (GABAPENTIN) 1 by mouth three times a day  #270 x 0   Entered by:   Almeta Monas CMA (AAMA)   Authorized by:   Loreen Freud DO   Signed by:   Almeta Monas CMA (AAMA) on 04/14/2010   Method used:   Faxed to ...       [facility] (mail-order)             , [place]         Ph: [phone]       Fax: [phone]   RxID:   [id number] LIPITOR 20 MG TABS (ATORVASTATIN CALCIUM) 1 by mouth once daily  #90 x 0   Entered by:   Almeta Monas CMA (AAMA)   Authorized by:   Loreen Freud DO   Signed by:   Almeta Monas CMA (AAMA) on 04/14/2010   Method used:   Faxed to ...       [facility] (mail-order)             , [place]         Ph: [phone]       Fax: [phone]   RxID:   [id number] PROZAC 10 MG CAPS (FLUOXETINE HCL) 1 by mouth once daily  #90 Capsule x 0   Entered by:   Almeta Monas CMA (AAMA)   Authorized by:   Loreen Freud DO   Signed by:   Almeta Monas CMA (AAMA) on 04/14/2010   Method used:   Faxed to ...       [facility] (mail-order)             , [place]         Ph: [phone]       Fax: [phone]   RxID:   [id number] DIOVAN 320 MG  TABS (VALSARTAN) 1 by mouth once daily.  #90 x 0  Entered by:   Almeta Monas CMA (AAMA)   Authorized by:   Loreen Freud DO   Signed by:   Almeta Monas CMA (AAMA) on 04/14/2010   Method used:   Faxed to ...       [facility] (mail-order)             , [place]         Ph: [phone]       Fax: [phone]   RxID:   [id number] ALENDRONATE SODIUM 70 MG  TABS (ALENDRONATE SODIUM) Take one tablet weekly.  #12 Tablet x 0   Entered by:   Almeta Monas CMA (AAMA)   Authorized by:   Loreen Freud DO   Signed by:   Almeta Monas CMA (AAMA) on 04/14/2010   Method used:   Faxed to ...       [facility] (mail-order)             , [place]         Ph: [phone]       Fax: [phone]   RxID:   [id number]

## 2010-04-29 ENCOUNTER — Encounter: Payer: Self-pay | Admitting: Family Medicine

## 2010-04-29 ENCOUNTER — Encounter (INDEPENDENT_AMBULATORY_CARE_PROVIDER_SITE_OTHER): Payer: Medicare PPO | Admitting: Family Medicine

## 2010-04-29 ENCOUNTER — Encounter (INDEPENDENT_AMBULATORY_CARE_PROVIDER_SITE_OTHER): Payer: Self-pay | Admitting: *Deleted

## 2010-04-29 DIAGNOSIS — I10 Essential (primary) hypertension: Secondary | ICD-10-CM

## 2010-04-29 DIAGNOSIS — M810 Age-related osteoporosis without current pathological fracture: Secondary | ICD-10-CM

## 2010-04-29 DIAGNOSIS — E119 Type 2 diabetes mellitus without complications: Secondary | ICD-10-CM

## 2010-04-29 DIAGNOSIS — Z136 Encounter for screening for cardiovascular disorders: Secondary | ICD-10-CM

## 2010-04-29 DIAGNOSIS — E785 Hyperlipidemia, unspecified: Secondary | ICD-10-CM

## 2010-04-29 DIAGNOSIS — Z0000 Encounter for general adult medical examination without abnormal findings: Secondary | ICD-10-CM

## 2010-04-29 DIAGNOSIS — N393 Stress incontinence (female) (male): Secondary | ICD-10-CM | POA: Insufficient documentation

## 2010-04-30 ENCOUNTER — Other Ambulatory Visit: Payer: Self-pay | Admitting: Family Medicine

## 2010-04-30 ENCOUNTER — Telehealth: Payer: Self-pay | Admitting: Family Medicine

## 2010-04-30 ENCOUNTER — Other Ambulatory Visit (INDEPENDENT_AMBULATORY_CARE_PROVIDER_SITE_OTHER): Payer: Medicare PPO

## 2010-04-30 ENCOUNTER — Encounter: Payer: Self-pay | Admitting: Family Medicine

## 2010-04-30 ENCOUNTER — Encounter (INDEPENDENT_AMBULATORY_CARE_PROVIDER_SITE_OTHER): Payer: Self-pay | Admitting: *Deleted

## 2010-04-30 DIAGNOSIS — E785 Hyperlipidemia, unspecified: Secondary | ICD-10-CM

## 2010-04-30 DIAGNOSIS — E1149 Type 2 diabetes mellitus with other diabetic neurological complication: Secondary | ICD-10-CM

## 2010-04-30 DIAGNOSIS — Z0000 Encounter for general adult medical examination without abnormal findings: Secondary | ICD-10-CM

## 2010-04-30 LAB — CONVERTED CEMR LAB
Bilirubin Urine: NEGATIVE
Ketones, urine, test strip: NEGATIVE
Nitrite: NEGATIVE
Protein, U semiquant: NEGATIVE
Urobilinogen, UA: 0.2

## 2010-04-30 LAB — BASIC METABOLIC PANEL
CO2: 29 mEq/L (ref 19–32)
Chloride: 98 mEq/L (ref 96–112)
Glucose, Bld: 135 mg/dL — ABNORMAL HIGH (ref 70–99)
Sodium: 137 mEq/L (ref 135–145)

## 2010-04-30 LAB — HEPATIC FUNCTION PANEL
Albumin: 4.1 g/dL (ref 3.5–5.2)
Alkaline Phosphatase: 55 U/L (ref 39–117)
Total Protein: 6.9 g/dL (ref 6.0–8.3)

## 2010-04-30 LAB — LIPID PANEL
Cholesterol: 100 mg/dL (ref 0–200)
HDL: 53.2 mg/dL (ref >39.00)
LDL Cholesterol: 38 mg/dL (ref 0–99)
Triglycerides: 43 mg/dL (ref 0.0–149.0)
VLDL: 8.6 mg/dL (ref 0.0–40.0)

## 2010-05-01 LAB — CONVERTED CEMR LAB: Vit D, 25-Hydroxy: 40 ng/mL (ref 30–89)

## 2010-05-05 ENCOUNTER — Telehealth: Payer: Self-pay | Admitting: Family Medicine

## 2010-05-05 ENCOUNTER — Encounter: Payer: Self-pay | Admitting: Family Medicine

## 2010-05-05 ENCOUNTER — Ambulatory Visit: Payer: Medicare PPO | Admitting: Physical Therapy

## 2010-05-05 ENCOUNTER — Other Ambulatory Visit: Payer: Self-pay | Admitting: Family Medicine

## 2010-05-05 ENCOUNTER — Other Ambulatory Visit (HOSPITAL_COMMUNITY): Payer: Medicare PPO

## 2010-05-05 ENCOUNTER — Inpatient Hospital Stay: Admission: RE | Admit: 2010-05-05 | Payer: Medicare PPO | Source: Ambulatory Visit

## 2010-05-05 DIAGNOSIS — M810 Age-related osteoporosis without current pathological fracture: Secondary | ICD-10-CM

## 2010-05-06 NOTE — Letter (Signed)
Summary: Primary Care Consult Scheduled Letter  [facility]  [address] [place], [place] [address]   Phone: [phone]  Fax: [phone]      04/29/2010 MRN: [id number]  Eryca Renninger [address] [place], [place]  [address]  [place]    Dear Ms. Mottley,    We have scheduled an appointment for you.  At the recommendation of Dr. Loreen Freud, we have scheduled you a Bone Density Scan with [facility] on 05-23-2010 at 9:30am.  Their address is [address] [place], [place] [address]. The office phone number is [phone].  If this appointment day and time is not convenient for you, please feel free to call the office of the doctor you are being referred to at the number listed above and reschedule the appointment.    It is important for you to keep your scheduled appointments. We are here to make sure you are given good patient care.   Thank you,    Renee, Patient Care Coordinator [facility]

## 2010-05-06 NOTE — Progress Notes (Signed)
Summary: phyucal therapy referral  Phone Note Call from Patient   Caller: Patient Summary of Call: Pt walked in stating that she would like a referral for physical therapy. Pt states that she already has appt set [facility] but they are requesting a referral. Pls advise.........Marland KitchenFelecia Deloach CMA  April 30, 2010 8:43 AM   Follow-up for Phone Call        is it for back pain?  referral put in. Follow-up by: Loreen Freud DO,  April 30, 2010 9:28 AM  Additional Follow-up for Phone Call Additional follow up Details #1::        Pt states it is for back pain, order printed and faxed to [facility]. ph [phone], fax [phone].Marland KitchenMarland KitchenFelecia Deloach CMA  April 30, 2010 10:19 AM

## 2010-05-06 NOTE — Assessment & Plan Note (Addendum)
Summary: PHYSICAL AND FASTING LABS///SPH   Vital Signs:  Patient profile:   76 year old female Menstrual status:  hysterectomy Height:      59 inches Weight:      134.4 pounds Pulse rate:   97 / minute Pulse rhythm:   regular BP sitting:   126 / 74  (left arm) Cuff size:   regular  Vitals Entered By: Almeta Monas CMA Duncan Dull) (April 29, 2010 8:41 AM) CC: CPX/Fasting---wants a new RX for Ambien  Does patient need assistance? Functional Status Self care, Cook/clean, Shopping, Social activities Ambulation Normal Comments Pt is able to do all adls and can read and write.  Vision Screening:      Vision Comments: optho q1y 40db HL: Left  Right  Audiometry Comment: grossly normal      Menstrual Status hysterectomy Last PAP Result NEGATIVE FOR INTRAEPITHELIAL LESIONS OR MALIGNANCY.   History of Present Illness: Pt here for cpe.  she is not fasting.   Pt is seeing Dr Lucianne Muss for DM,  Dr Kimbrough--urology,  optho--Dr Jamelle Rushing.  dentist --[place].    Preventive Screening-Counseling & Management  Alcohol-Tobacco     Alcohol drinks/day: 0     Smoking Status: never     Passive Smoke Exposure: no  Caffeine-Diet-Exercise     Caffeine use/day: 0     Does Patient Exercise: no  Problems Prior to Update: 1)  Urinary Incontinence, Stress, Female  (ICD-625.6) 2)  Cough  (ICD-786.2) 3)  Memory Loss  (ICD-780.93) 4)  Diabetic Peripheral Neuropathy  (ICD-250.60) 5)  Back Pain  (ICD-724.5) 6)  Fibroids, Uterus  (ICD-218.9) 7)  Abdominal Bloating  (ICD-787.3) 8)  Routine Gynecological Examination  (ICD-V72.31) 9)  Constipation  (ICD-564.00) 10)  Screening Colorectal-cancer  (ICD-V76.51) 11)  Gerd  (ICD-530.81) 12)  Preventive Health Care  (ICD-V70.0) 13)  Family History Diabetes 1st Degree Relative  (ICD-V18.0) 14)  Family History Depression  (ICD-V17.0) 15)  Sprain&strain Other Specified Sites Knee&leg  (ICD-844.8) 16)  Uti  (ICD-599.0) 17)  Laceration of Finger   (ICD-883.0) 18)  Hypertension  (ICD-401.9) 19)  Hyperlipidemia  (ICD-272.4) 20)  Diabetes Mellitus, Type II  (ICD-250.00) 21)  Osteoporosis Nos  (ICD-733.00) 22)  Dm, Uncomplicated, Type II  (ICD-250.00) 23)  Thyroid Nodule, Right  (ICD-241.0)  Medications Prior to Update: 1)  Lipitor 20 Mg Tabs (Atorvastatin Calcium) .Marland Kitchen.. 1 By Mouth Once Daily 2)  Alendronate Sodium 70 Mg  Tabs (Alendronate Sodium) .... Take One Tablet Weekly. 3)  Diovan 320 Mg  Tabs (Valsartan) .Marland Kitchen.. 1 By Mouth Once Daily. 4)  Amaryl 1 Mg  Tabs (Glimepiride) .... 2 By Mouth in The Afternoon 5)  Mvi .Marland Kitchen.. 3  By Mouth Once Daily 6)  Lovaza 1 Gm  Caps (Omega-3-Acid Ethyl Esters) .... Take Two Capsules Twice A Day 7)  Niaspan 1000 Mg Cr-Tabs (Niacin (Antihyperlipidemic)) .... Take 1 Tablet At Bedtime 8)  Janumet 50-1000 Mg Tabs (Sitagliptin-Metformin Hcl) .Marland Kitchen.. 1 By Mouth Once Daily 9)  Prozac 10 Mg Caps (Fluoxetine Hcl) .Marland Kitchen.. 1 By Mouth Once Daily 10)  Flexeril 10 Mg Tabs (Cyclobenzaprine Hcl) .Marland Kitchen.. 1 By Mouth Three Times A Day 11)  Levemir 100 Unit/ml Soln (Insulin Detemir) .... 5 Units At Bedtime 12)  Aspirin 81 Mg  Tabs (Aspirin) .Marland Kitchen.. 1 By Mouth Once Daily 13)  One-A-Day Extras Antioxidant  Caps (Multiple Vitamins-Minerals) .... Once Daily 14)  B Complex-C  Tabs (B Complex-C) .... Once Daily 15)  Miralax  Powd (Polyethylene Glycol 3350) .... Use  17 Grams in 8 0z of Water. 16)  Neurontin 100 Mg Caps (Gabapentin) .Marland Kitchen.. 1 By Mouth Three Times A Day 17)  Omeprazole 20 Mg Cpdr (Omeprazole) .Marland Kitchen.. 1 By Mouth Once Daily 18)  Xyzal 5 Mg Tabs (Levocetirizine Dihydrochloride) .Marland Kitchen.. 1 By Mouth Once Daily 19)  Flonase 50 Mcg/act Susp (Fluticasone Propionate) .... 2 Sprays Each Nostril Once Daily 20)  Cheratussin Ac 100-10 Mg/11ml Syrp (Guaifenesin-Codeine) .Marland Kitchen.. 1-2 Tsp By Mouth At Bedtime As Needed  Current Medications (verified): 1)  Zocor 40 Mg Tabs (Simvastatin) .Marland Kitchen.. 1 By Mouth Once Daily 2)  Alendronate Sodium 70 Mg  Tabs  (Alendronate Sodium) .... Take One Tablet Weekly. 3)  Diovan 320 Mg  Tabs (Valsartan) .Marland Kitchen.. 1 By Mouth Once Daily. 4)  Amaryl 1 Mg  Tabs (Glimepiride) .... 2 Mg in The Afternoon and 1mg  At Night 5)  Mvi .Marland Kitchen.. 3  By Mouth Once Daily 6)  Lovaza 1 Gm  Caps (Omega-3-Acid Ethyl Esters) .... Take Two Capsules Twice A Day 7)  Niaspan 1000 Mg Cr-Tabs (Niacin (Antihyperlipidemic)) .... Take 1 Tablet At Bedtime 8)  Janumet 50-1000 Mg Tabs (Sitagliptin-Metformin Hcl) .Marland Kitchen.. 1 By Mouth Two Times A Day 9)  Prozac 10 Mg Caps (Fluoxetine Hcl) .Marland Kitchen.. 1 By Mouth Once Daily 10)  Flexeril 10 Mg Tabs (Cyclobenzaprine Hcl) .Marland Kitchen.. 1 By Mouth Three Times A Day 11)  Levemir 100 Unit/ml Soln (Insulin Detemir) .Marland Kitchen.. 18 Units At Bedtime 12)  Aspirin 81 Mg  Tabs (Aspirin) .Marland Kitchen.. 1 By Mouth Once Daily 13)  One-A-Day Extras Antioxidant  Caps (Multiple Vitamins-Minerals) .... Once Daily 14)  B Complex-C  Tabs (B Complex-C) .... Once Daily 15)  Miralax  Powd (Polyethylene Glycol 3350) .... Use 17 Grams in 8 0z of Water. 16)  Neurontin 100 Mg Caps (Gabapentin) .Marland Kitchen.. 1 By Mouth Three Times A Day 17)  Omeprazole 20 Mg Cpdr (Omeprazole) .Marland Kitchen.. 1 By Mouth Once Daily 18)  Xyzal 5 Mg Tabs (Levocetirizine Dihydrochloride) .Marland Kitchen.. 1 By Mouth Once Daily 19)  Flonase 50 Mcg/act Susp (Fluticasone Propionate) .... 2 Sprays Each Nostril Once Daily 20)  Cheratussin Ac 100-10 Mg/16ml Syrp (Guaifenesin-Codeine) .Marland Kitchen.. 1-2 Tsp By Mouth At Bedtime As Needed 21)  Ambien 5 Mg Tabs (Zolpidem Tartrate) .Marland Kitchen.. 1 By Mouth At Bedtime 22)  Norvasc 2.5 Mg Tabs (Amlodipine Besylate) .Marland Kitchen.. 1 By Mouth Once Daily 23)  Zostavax 16109 Unt/0.62ml Solr (Zoster Vaccine Live) .Marland Kitchen.. 1 Ml Im X1  Allergies (verified): 1)  ! Sulfa 2)  ! Pcn  Past History:  Past Medical History: Last updated: 08/05/2006 Diabetes mellitus, type II Hyperlipidemia Hypertension  Family History: Last updated: 12/19/2008 Family History Depression laryngeal CA--F Family History Diabetes 1st degree  relative---M  Social History: Last updated: 12/09/2007 Married Never Smoked Drug use-no Regular exercise-no  Risk Factors: Alcohol Use: 0 (04/29/2010) Caffeine Use: 0 (04/29/2010) Exercise: no (04/29/2010)  Risk Factors: Smoking Status: never (04/29/2010) Passive Smoke Exposure: no (04/29/2010)  Past Surgical History: Appendectomy surgical sterilization 40 yrs ago laser surgery L eye  Family History: Reviewed history from 12/19/2008 and no changes required. Family History Depression laryngeal CA--F Family History Diabetes 1st degree relative---M  Social History: Reviewed history from 12/09/2007 and no changes required. Married Never Smoked Drug use-no Regular exercise-no  Review of Systems      See HPI General:  Denies chills, fatigue, fever, loss of appetite, malaise, sleep disorder, sweats, weakness, and weight loss. Eyes:  Denies blurring, discharge, double vision, eye irritation, eye pain, halos, itching, light sensitivity, and red eye;  decreased vision L eye---+ cataract. ENT:  Denies decreased hearing, difficulty swallowing, ear discharge, earache, hoarseness, nasal congestion, nosebleeds, postnasal drainage, ringing in ears, sinus pressure, and sore throat. CV:  Denies bluish discoloration of lips or nails, chest pain or discomfort, difficulty breathing at night, difficulty breathing while lying down, fainting, fatigue, leg cramps with exertion, lightheadness, near fainting, palpitations, shortness of breath with exertion, swelling of feet, swelling of hands, and weight gain. Resp:  Denies chest discomfort, chest pain with inspiration, cough, coughing up blood, excessive snoring, hypersomnolence, morning headaches, pleuritic, shortness of breath, sputum productive, and wheezing. GI:  Denies abdominal pain, bloody stools, change in bowel habits, constipation, dark tarry stools, diarrhea, excessive appetite, gas, hemorrhoids, indigestion, loss of appetite, nausea,  vomiting, vomiting blood, and yellowish skin color. GU:  Denies abnormal vaginal bleeding, decreased libido, discharge, dysuria, genital sores, hematuria, incontinence, nocturia, urinary frequency, and urinary hesitancy. MS:  Denies joint pain, joint redness, joint swelling, loss of strength, low back pain, mid back pain, muscle aches, muscle , cramps, muscle weakness, stiffness, and thoracic pain. Derm:  Denies changes in color of skin, changes in nail beds, dryness, excessive perspiration, flushing, hair loss, insect bite(s), itching, lesion(s), poor wound healing, and rash. Neuro:  Denies brief paralysis, difficulty with concentration, disturbances in coordination, falling down, headaches, inability to speak, memory loss, numbness, poor balance, seizures, sensation of room spinning, tingling, tremors, visual disturbances, and weakness. Psych:  Denies alternate hallucination ( auditory/visual), anxiety, depression, easily angered, easily tearful, irritability, mental problems, panic attacks, sense of great danger, suicidal thoughts/plans, thoughts of violence, unusual visions or sounds, and thoughts /plans of harming others. Endo:  Denies cold intolerance, excessive hunger, excessive thirst, excessive urination, heat intolerance, polyuria, and weight change. Heme:  Denies abnormal bruising, bleeding, enlarge lymph nodes, fevers, pallor, and skin discoloration. Allergy:  Denies hives or rash, itching eyes, persistent infections, seasonal allergies, and sneezing.  Physical Exam  General:  Well-developed,well-nourished,in no acute distress; alert,appropriate and cooperative throughout examination Head:  Normocephalic and atraumatic without obvious abnormalities. No apparent alopecia or balding. Eyes:  pupils equal, pupils round, pupils reactive to light, and no injection.   Ears:  External ear exam shows no significant lesions or deformities.  Otoscopic examination reveals clear canals, tympanic  membranes are intact bilaterally without bulging, retraction, inflammation or discharge. Hearing is grossly normal bilaterally. Nose:  External nasal examination shows no deformity or inflammation. Nasal mucosa are pink and moist without lesions or exudates. Mouth:  Oral mucosa and oropharynx without lesions or exudates.  Teeth in good repair. Neck:  No deformities, masses, or tenderness noted.no carotid bruits.   Chest Wall:  No deformities, masses, or tenderness noted. Breasts:  No mass, nodules, thickening, tenderness, bulging, retraction, inflamation, nipple discharge or skin changes noted.   Lungs:  Normal respiratory effort, chest expands symmetrically. Lungs are clear to auscultation, no crackles or wheezes. Heart:  normal rate and no murmur.   Abdomen:  Bowel sounds positive,abdomen soft and non-tender without masses, organomegaly or hernias noted. Msk:  normal ROM, no joint tenderness, no joint swelling, no joint warmth, no redness over joints, no joint deformities, no joint instability, and no crepitation.   Skin:  Intact without suspicious lesions or rashes Cervical Nodes:  No lymphadenopathy noted Axillary Nodes:  No palpable lymphadenopathy Psych:  Oriented X3, normally interactive, good eye contact, not anxious appearing, not depressed appearing, and not suicidal.    Diabetes Management Exam:    Foot Exam (with socks and/or shoes not present):  Sensory-Pinprick/Light touch:          Left medial foot (L-4): normal          Left dorsal foot (L-5): normal          Left lateral foot (S-1): normal          Right medial foot (L-4): normal          Right dorsal foot (L-5): normal          Right lateral foot (S-1): normal       Sensory-Monofilament:          Left foot: normal          Right foot: normal       Inspection:          Left foot: normal          Right foot: normal       Nails:          Left foot: normal          Right foot: normal    Eye Exam:       Eye Exam  done elsewhere          Date: 04/09/2010          Results: cataracts          Done by: [facility]o-[facility]   Impression & Recommendations:  Problem # 1:  PREVENTIVE HEALTH CARE (ICD-V70.0)  ghm utd check fasting labs  Orders: Medicare -1st Annual Wellness Visit [phone]) EKG w/ Interpretation (93000)  Problem # 2:  URINARY INCONTINENCE, STRESS, FEMALE (ICD-625.6)  Orders: Urology Referral (Urology)  Problem # 3:  DIABETES MELLITUS, TYPE II (ICD-250.00) per Dr Lucianne Muss Her updated medication list for this problem includes:    Diovan 320 Mg Tabs (Valsartan) .Marland Kitchen... 1 by mouth once daily.    Amaryl 1 Mg Tabs (Glimepiride) .Marland Kitchen... 2 mg in the afternoon and 1mg  at night    Janumet 50-1000 Mg Tabs (Sitagliptin-metformin hcl) .Marland Kitchen... 1 by mouth two times a day    Levemir 100 Unit/ml Soln (Insulin detemir) .Marland KitchenMarland KitchenMarland KitchenMarland Kitchen 18 units at bedtime    Aspirin 81 Mg Tabs (Aspirin) .Marland Kitchen... 1 by mouth once daily  Labs Reviewed: Creat: 0.5 (12/23/2009)     Last Eye Exam: cataracts (04/09/2010) Reviewed HgBA1c results: 6.7 (12/17/2008)  6.6 (12/29/2007)  Problem # 4:  HYPERLIPIDEMIA (ICD-272.4) Assessment: Comment Only  Her updated medication list for this problem includes:    Zocor 40 Mg Tabs (Simvastatin) .Marland Kitchen... 1 by mouth once daily    Lovaza 1 Gm Caps (Omega-3-acid ethyl esters) .Marland Kitchen... Take two capsules twice a day  Labs Reviewed: SGOT: 22 (12/23/2009)   SGPT: 21 (12/23/2009)  Lipid Goals: Chol Goal: 200 (01/12/2007)   HDL Goal: 40 (01/12/2007)   LDL Goal: 100 (01/12/2007)   TG Goal: 150 (01/12/2007)  Prior 10 Yr Risk Heart Disease: 17 % (01/12/2007)   HDL:65.30 (06/28/2009), 61.90 (12/17/2008)  LDL:40 (06/28/2009), 40 (12/17/2008)  Chol:113 (06/28/2009), 112 (12/17/2008)  Trig:39.0 (06/28/2009), 53.0 (12/17/2008)  Problem # 5:  HYPERTENSION (ICD-401.9)  Her updated medication list for this problem includes:    Diovan 320 Mg Tabs (Valsartan) .Marland Kitchen... 1 by mouth once daily.    Norvasc 2.5 Mg Tabs (Amlodipine  besylate) .Marland Kitchen... 1 by mouth once daily  BP today: 126/74 Prior BP: 130/70 (12/23/2009)  Prior 10 Yr Risk Heart Disease: 17 % (01/12/2007)  Labs Reviewed: K+: 4.5 (12/23/2009) Creat: : 0.5 (12/23/2009)   Chol: 113 (06/28/2009)   HDL: 65.30 (06/28/2009)  LDL: 40 (06/28/2009)   TG: 39.0 (06/28/2009)  Complete Medication List: 1)  Zocor 40 Mg Tabs (Simvastatin) .Marland Kitchen.. 1 by mouth once daily 2)  Alendronate Sodium 70 Mg Tabs (Alendronate sodium) .... Take one tablet weekly. 3)  Diovan 320 Mg Tabs (Valsartan) .Marland Kitchen.. 1 by mouth once daily. 4)  Amaryl 1 Mg Tabs (Glimepiride) .... 2 mg in the afternoon and 1mg  at night 5)  Mvi  .Marland Kitchen.. 3  by mouth once daily 6)  Lovaza 1 Gm Caps (Omega-3-acid ethyl esters) .... Take two capsules twice a day 7)  Niaspan 1000 Mg Cr-tabs (niacin (antihyperlipidemic))  .... Take 1 tablet at bedtime 8)  Janumet 50-1000 Mg Tabs (Sitagliptin-metformin hcl) .Marland Kitchen.. 1 by mouth two times a day 9)  Prozac 10 Mg Caps (Fluoxetine hcl) .Marland Kitchen.. 1 by mouth once daily 10)  Flexeril 10 Mg Tabs (Cyclobenzaprine hcl) .Marland Kitchen.. 1 by mouth three times a day 11)  Levemir 100 Unit/ml Soln (Insulin detemir) .Marland Kitchen.. 18 units at bedtime 12)  Aspirin 81 Mg Tabs (Aspirin) .Marland Kitchen.. 1 by mouth once daily 13)  One-a-day Extras Antioxidant Caps (Multiple vitamins-minerals) .... Once daily 14)  B Complex-c Tabs (B complex-c) .... Once daily 15)  Miralax Powd (Polyethylene glycol 3350) .... Use 17 grams in 8 0z of water. 16)  Neurontin 100 Mg Caps (Gabapentin) .Marland Kitchen.. 1 by mouth three times a day 17)  Omeprazole 20 Mg Cpdr (Omeprazole) .Marland Kitchen.. 1 by mouth once daily 18)  Xyzal 5 Mg Tabs (Levocetirizine dihydrochloride) .Marland Kitchen.. 1 by mouth once daily 19)  Flonase 50 Mcg/act Susp (Fluticasone propionate) .... 2 sprays each nostril once daily 20)  Cheratussin Ac 100-10 Mg/12ml Syrp (Guaifenesin-codeine) .Marland Kitchen.. 1-2 tsp by mouth at bedtime as needed 21)  Ambien 5 Mg Tabs (Zolpidem tartrate) .Marland Kitchen.. 1 by mouth at bedtime 22)  Norvasc 2.5 Mg  Tabs (Amlodipine besylate) .Marland Kitchen.. 1 by mouth once daily 23)  Zostavax 16109 Unt/0.38ml Solr (Zoster vaccine live) .Marland Kitchen.. 1 ml im x1  Other Orders: T-Bone Densitometry (60454) T-Lumbar Vertebral Assessment (09811)  Patient Instructions: 1)  fasting labs  250.00,  272.4  lipid, hep, bmp, 733.0  vita D  Prescriptions: ZOSTAVAX 91478 UNT/0.65ML SOLR (ZOSTER VACCINE LIVE) 1 ml Im x1  #1 x 0   Entered and Authorized by:   Loreen Freud DO   Signed by:   Loreen Freud DO on 04/29/2010   Method used:   Print then Give to Patient   RxID:   [id number] XYZAL 5 MG TABS (LEVOCETIRIZINE DIHYDROCHLORIDE) 1 by mouth once daily  #90 x 3   Entered and Authorized by:   Loreen Freud DO   Signed by:   Loreen Freud DO on 04/29/2010   Method used:   Faxed to ...       [facility] (mail-order)       [address]       [place], [place]  [id number]       Ph: [phone]       Fax: [phone]   RxID:   [id number] NEURONTIN 100 MG CAPS (GABAPENTIN) 1 by mouth three times a day  #270 x 3   Entered and Authorized by:   Loreen Freud DO   Signed by:   Loreen Freud DO on 04/29/2010   Method used:   Faxed to ...       [facility] (mail-order)       [address]       [place], [place]  [id number]  Ph: [phone]       Fax: [phone]   RxID:   [id number] OMEPRAZOLE 20 MG CPDR (OMEPRAZOLE) 1 by mouth once daily  #90 x 3   Entered and Authorized by:   Loreen Freud DO   Signed by:   Loreen Freud DO on 04/29/2010   Method used:   Faxed to ...       [facility] (mail-order)       [address]       [place], [place]  [id number]       Ph: [phone]       Fax: [phone]   RxID:   [id number] FLEXERIL 10 MG TABS (CYCLOBENZAPRINE HCL) 1 by mouth three times a day  #90 x 1   Entered and Authorized by:   Loreen Freud DO   Signed by:   Loreen Freud DO on 04/29/2010   Method used:   Faxed to ...       [facility]  (mail-order)       [address]       [place], [place]  [id number]       Ph: [phone]       Fax: [phone]   RxID:   [id number] LOVAZA 1 GM  CAPS (OMEGA-3-ACID ETHYL ESTERS) Take two capsules twice a day  #360 Capsule x 3   Entered and Authorized by:   Loreen Freud DO   Signed by:   Loreen Freud DO on 04/29/2010   Method used:   Faxed to ...       [facility] (mail-order)       [address]       [place], [place]  [id number]       Ph: [phone]       Fax: [phone]   RxID:   [id number] DIOVAN 320 MG  TABS (VALSARTAN) 1 by mouth once daily.  #90 x 3   Entered and Authorized by:   Loreen Freud DO   Signed by:   Loreen Freud DO on 04/29/2010   Method used:   Faxed to ...       [facility] (mail[facility]order)       [address]       [place], [place]  [id number]       Ph: [phone]       Fax: [phone]   RxID:   [id number] ALENDRONATE SODIUM 70 MG  TABS (ALENDRONATE SODIUM) Take one tablet weekly.  #12 Tablet x 3   Entered and Authorized by:   Loreen Freud DO   Signed by:   Loreen Freud DO on 04/29/2010   Method used:   Faxed to ...       [facility] (mail[facility])       [address]       [place], [place]  [id number]       Ph: [phone]       Fax: [phone]   RxID:   [id number] ZOCOR 40 MG TABS (SIMVASTATIN) 1 by mouth once daily  #90 x 3   Entered and Authorized by:   Loreen Freud DO   Signed by:   Loreen Freud DO on 04/29/2010   Method used:   Faxed to ...       [facility] (mail[facility])       [address]       [place], [place]  [id number]       Ph: [phone]  Fax: [phone]   RxID:   [id number] NORVASC 2.5 MG TABS (AMLODIPINE BESYLATE) 1 by mouth once daily  #90 x 3   Entered and Authorized by:   Loreen Freud DO   Signed by:   Loreen Freud DO on 04/29/2010   Method used:   Faxed to ...       [facility] (mail-order)       [address]        [place], [place]  [id number]       Ph: [phone]       Fax: [phone]   RxID:   [id number] AMBIEN 5 MG TABS (ZOLPIDEM TARTRATE) 1 by mouth at bedtime  #90 x 0   Entered and Authorized by:   Loreen Freud DO   Signed by:   Loreen Freud DO on 04/29/2010   Method used:   Printed then faxed to ...       [facility] (mail-order)       [address]       [place], [place]  [id number]       Ph: [phone]       Fax: [phone]   RxID:   [id number]    Orders Added: 1)  T-Bone Densitometry [77080] 2)  T-Lumbar Vertebral Assessment [77082] 3)  Urology Referral [Urology] 4)  Medicare -1st Annual Wellness Visit [G0438] 5)  EKG w/ Interpretation [93000] 6)  Est. Patient Level III [37628]     EKG  Procedure date:  04/29/2010  Findings:      Normal sinus rhythm with rate of:  97     Last Flu Vaccine:  Fluvax 3+ (12/05/2008 2:42:45 PM) Flu Vaccine Result Date:  12/04/2009 Flu Vaccine Result:  given Flu Vaccine Next Due:  1 yr     Appended Document: Orders Update    Clinical Lists Changes  Orders: Added new Test order of T-Bone Densitometry [phone]) - Signed Added new Test order of T-Lumbar Vertebral Assessment [phone]) - Signed

## 2010-05-13 NOTE — Progress Notes (Signed)
Summary: Note from Pharmacy  Phone Note From Pharmacy Call back at [phone] Message from:  Patient  Refills Requested: Medication #1:  ZOCOR 40 MG TABS 1 by mouth once daily   Supply Requested: 1 month Message: New rx for Zocor 40 mg. This patient also takes Amlodipine. Caution is advised with Amlodipine & Zocor at doses >20mg  due to the increased risk of myopathy. Should we continue to fill Zocor 40mg ?   Initial call taken by: Lavell Islam,  May 05, 2010 2:44 PM Caller: [facility]  Summary of Call: Message: New rx for Zocor 40 mg. This patient also takes Amlodipine. Caution is advised with Amlodipine & Zocor at doses >20mg  due to the increased risk of myopathy. Should we continue to fill Zocor 40mg ?  Initial call taken by: Lavell Islam,  May 05, 2010 2:45 PM  Follow-up for Phone Call        please advise Follow-up by: Almeta Monas CMA [facility] Dull),  May 05, 2010 2:50 PM  Additional Follow-up for Phone Call Additional follow up Details #1::        pt has been on in for years and Endo put her back on it so con't it.  Additional Follow-up by: Loreen Freud DO,  May 05, 2010 3:09 PM    Additional Follow-up for Phone Call Additional follow up Details #2::    Called [facility] at 800 [phone] and made them aware of the above Follow-up by: Almeta Monas CMA Duncan Dull),  May 06, 2010 4:53 PM

## 2010-05-13 NOTE — Miscellaneous (Signed)
Summary: Orders/[facility]  Orders/[facility]   Imported By: Maryln Gottron 05/08/2010 11:29:14  _____________________________________________________________________  External Attachment:    Type:   Image     Comment:   External Document

## 2010-05-23 ENCOUNTER — Other Ambulatory Visit: Payer: Medicare PPO

## 2010-06-04 ENCOUNTER — Ambulatory Visit: Payer: Medicare PPO | Admitting: *Deleted

## 2010-06-04 DIAGNOSIS — Z0000 Encounter for general adult medical examination without abnormal findings: Secondary | ICD-10-CM

## 2010-06-04 DIAGNOSIS — Z23 Encounter for immunization: Secondary | ICD-10-CM

## 2010-06-13 MED ORDER — ZOSTER VACCINE LIVE 19400 UNT/0.65ML ~~LOC~~ SOLR: 0.6500 mL | Freq: Once | SUBCUTANEOUS | Status: DC

## 2010-06-13 MED ORDER — ZOSTER VACCINE LIVE 19400 UNT/0.65ML ~~LOC~~ SOLR
0.6500 mL | Freq: Once | SUBCUTANEOUS | Status: DC
  Administered 2010-06-13: 19400 [IU] via SUBCUTANEOUS

## 2010-06-25 ENCOUNTER — Telehealth: Payer: Self-pay | Admitting: *Deleted

## 2010-06-25 DIAGNOSIS — M797 Fibromyalgia: Secondary | ICD-10-CM

## 2010-06-25 NOTE — Telephone Encounter (Signed)
I received call from physical therapy that the pt is in [place] and has shown up for PT. Pt did not go to local PT in March of this year. New order will be faxed (old one cannot be accepted due to Medicare guidelines.

## 2010-06-26 ENCOUNTER — Telehealth: Payer: Self-pay | Admitting: Family Medicine

## 2010-06-26 NOTE — Telephone Encounter (Signed)
Spoke with Pt and advise again re-conversation on Monday per Dr Laury Axon unable to refer out of state if Pt is in that much pain she will need to be evaluated by MD in [place] who can referred her to appropriate facility/care. Pt ok and verbalized understanding.

## 2010-06-26 NOTE — Telephone Encounter (Signed)
Patient called to report that she is up in [place] visiting relatives and is having a lot of discomfort due to her fibromyalgia---would like to get a referral to see a physical therapist up there---relative recommended "[facility]"---doesn't know street address, [facility] phone number is [phone]---can she get a referral?   Please call patient and let her know

## 2010-07-02 ENCOUNTER — Telehealth: Payer: Self-pay | Admitting: Family Medicine

## 2010-07-02 NOTE — Telephone Encounter (Signed)
Patient called ---she is still in [place]----says she is taking Oxzal (did she mean Xyzal??---had trouble understanding spelling over phone) at night for her cough and it is helping to calm the cough so she can sleep, but.....cough is coming back by noon or early afternoon---can she take a 2nd pill at lunch ?    She will need a refill for this medication to be faxed into [facility] for a 3 month supply

## 2010-07-02 NOTE — Telephone Encounter (Signed)
Pt is currently taking  XYZAL 5 MG TABS qd. Pls advise on alternative med for cough.

## 2010-07-02 NOTE — Telephone Encounter (Signed)
mucines'or robitussin to star

## 2010-07-03 MED ORDER — VALSARTAN 320 MG PO TABS: 320.0000 mg | ORAL_TABLET | Freq: Every day | ORAL | Status: DC

## 2010-07-03 MED ORDER — AMLODIPINE BESYLATE 2.5 MG PO TABS: 2.5000 mg | ORAL_TABLET | Freq: Every day | ORAL | Status: DC

## 2010-07-03 MED ORDER — OMEPRAZOLE 20 MG PO CPDR: 20.0000 mg | DELAYED_RELEASE_CAPSULE | Freq: Every day | ORAL | Status: DC

## 2010-07-03 MED ORDER — OMEGA-3-ACID ETHYL ESTERS 1 G PO CAPS: 2.0000 g | ORAL_CAPSULE | Freq: Two times a day (BID) | ORAL | Status: DC

## 2010-07-03 MED ORDER — FLUOXETINE HCL 10 MG PO CAPS: 10.0000 mg | ORAL_CAPSULE | Freq: Every day | ORAL | Status: DC

## 2010-07-03 MED ORDER — LEVOCETIRIZINE DIHYDROCHLORIDE 5 MG PO TABS: 5.0000 mg | ORAL_TABLET | Freq: Every evening | ORAL | Status: DC

## 2010-07-03 MED ORDER — ALENDRONATE SODIUM 70 MG PO TABS: 70.0000 mg | ORAL_TABLET | ORAL | Status: DC

## 2010-07-03 MED ORDER — SIMVASTATIN 40 MG PO TABS: 40.0000 mg | ORAL_TABLET | Freq: Every evening | ORAL | Status: DC

## 2010-07-03 NOTE — Telephone Encounter (Signed)
Spoke with patient and made her aware of Dr.Lowne's recommendations and she she voiced understanding---Request a refill on her [facility], I advised patient she needs to call [facility] and have them send in a request so we know which meds needs to be filled---language barrier caused the patient not to understand what I was trying to explain to her, she kept trying to argue with me and I again advised her to have them send the fax and she said ok and the call ended   KP

## 2010-07-11 NOTE — Assessment & Plan Note (Signed)
DATE OF VISIT:  December 09, 2004.   REASON FOR VISIT:  Mary Flynn returns to day.  I last saw her May 08, 2004.   INTERVAL HISTORY:  Fall about 1 week ago.  She had x-rays done December 05, 2004.  She states that fall was while she was visiting family in [place].  It  was dark at night, but she became dizzy.  The room was spinning prior to  this time and then she fell on her buttocks and back area.  No lower  extremity weakness or bowel or bladder dysfunction, just mainly pain over  the entire back from the coccyx up to the interscapular region.  No lower  extremity or upper extremity weakness.  She rates her pain at a 5/10 and has  gradually improved.  She can walk an hour at a time.  She states that about  1 month prior to this fall, she has had episodes where if she turns her head  one way or another or tilts her head backwards or forwards, she gets similar  room spinning type of symptoms.   PHYSICAL EXAMINATION:  VITAL SIGNS:  Blood pressure 146/70, pulse 94,  respirations 126, O2 saturations 97% on room air.  NEUROLOGIC:  Cranial nerves 2-12 grossly intact.  No extraocular movements  weakness.  No facial droop.  Tongue protrudes midline.  Equal shoulder  shrug, but decreased visual acuity which is chronic in the left eye.  She  has no evidence of nystagmus.  Motor strength is 5-/5 in bilateral deltoids,  biceps, triceps, hip flexion, extension and ankle dorsiflexion.  Range of  motion is 4 bilaterally in upper and lower extremities.  Tenderness to  palpation in thoracic as well as lumbar paraspinal as well as intraspinous  ligaments starting from the mid thoracic level all the way down to sacral  area.  Some pain over coccygeal area as well to palpation.   LABORATORY DATA AND X-RAY FINDINGS:  X-rays show that T12 superior end  plates with compression deformity approximately 10% of original height.  No  other abnormalities are seen.  X-ray demonstrated T12 through sacral area.   IMPRESSION:  1.  Myofascial pain syndrome flared up since fall.  2.  T12 compression fracture, question old versus new.  Certainly, she is      functioning well and is able to get up and down off the ground by      herself quite easily, walk without assistance and really has not had      loss of her functional ability related to this.  3.  Positional vertigo.  She is scheduled for magnetic resonance imaging of      her brain, but appears that current symptoms are more associated with      head position changes.  Would like to send her to vestibular      rehabilitation evaluation and treatment at [facility].  I am not sure whether she can get this done at the      [facility] office versus needing to go to the [facility] location.  4.  Osteoporosis.  This has been treated with 3 years.  She is to continue      her calcium, continue Evista.  It sounds      like she is getting adequate medical management of her osteoporosis.      She can follow up with endocrinology as well as primary care physician  in regards to this.      Erick Colace, M.D.  Electronically Signed     AEK/MedQ  D:  12/09/2004 13:45:20  T:  12/09/2004 15:59:32  Job #:  [id number]   cc:   Casimiro Needle L. Thad Ranger, M.D.  Fax: [phone]   Lelon Perla, M.D.  [address] [place], [place] [address]

## 2010-07-11 NOTE — Procedures (Signed)
NAMEDERIONA, ALTEMOSE                   ACCOUNT NO.:  [id number]   MEDICAL RECORD NO.:  [id number]          PATIENT TYPE:  REC   LOCATION:  TPC                          FACILITY:  [facility]   PHYSICIAN:  Erick Colace, M.D.DATE OF BIRTH:  1935/01/28   DATE OF PROCEDURE:  DATE OF DISCHARGE:                                 OPERATIVE REPORT   PROCEDURE:  Bilateral T12 paraspinal trigger point injection.   INDICATIONS FOR PROCEDURE:  Back pain increased after fall, also L4-5  intraspinous ligament injection.   The area is marked and prepped with Betadine and through a 27 gauge 1/2 inch  needle injected 1% lidocaine, 0.5 mL at L4-5 interspinous ligaments and 0.75  on each side at T12 paraspinal. The patient tolerated the procedure well.      Erick Colace, M.D.  Electronically Signed     AEK/MEDQ  D:  12/09/2004 13:39:36  T:  12/09/2004 14:39:42  Job:  [id number]   cc:   Loreen Freud, M.D.  [address] [place]  [place] [address]   Marolyn Hammock. Thad Ranger, M.D.  Fax: [phone]

## 2010-07-11 NOTE — Op Note (Signed)
   NAME:  Mary Flynn, FRISBY                             ACCOUNT NO.:  [id number]   MEDICAL RECORD NO.:  [id number]                   PATIENT TYPE:  AMB   LOCATION:  ENDO                                 FACILITY:  [facility]   PHYSICIAN:  Wilhemina Bonito. Marina Goodell, M.D. [facility]             DATE OF BIRTH:  02-Jun-1934   DATE OF PROCEDURE:  10/02/2002  DATE OF DISCHARGE:  10/02/2002                                 OPERATIVE REPORT   PROCEDURE:  A 24 hour pH study.   INDICATIONS:  Rule out reflux disease.   HISTORY:  This is a 76 year old female with chronic cough allegedly due to  reflux.  She has not responded to reflux medications.  She is now for a 24  hour pH study off GI medication.   DESCRIPTION OF PROCEDURE:  The 24 hour pH probe was placed in the standard  manner.  Analysis occurred for 24 hours and 16 minutes.  The findings were  as follows:  1. The proximal channel revealed a total of nine reflux episodes with total     percentage of time with pH below 4.0, equalling 0.1%.  2. The distal channel revealed a total of eight reflux episodes.  A     composite score analysis, (Jonathan/DeMeester) was calculated to be 4.1,     (normal less than 22.0).  3. Syndrome diary revealed no correlation with complaints of cough and     reflux episodes.   IMPRESSION:  No evidence of gastroesophageal reflux disease by DeMeester  criteria.  In addition, no correlation between episodes of cough and reflux.  A such, problems other than reflux should be sought as a cause for cough.  There is no indication for the patient to be maintained on proton pump  inhibitor therapy.                                                Wilhemina Bonito. Marina Goodell, M.D. [facility]    JNP/MEDQ  D:  10/23/2002  T:  10/23/2002  Job:  [id number]   cc:   Charlaine Dalton. Sherene Sires, M.D. [facility]   Angelena Sole, M.D. [facility]

## 2010-07-11 NOTE — Op Note (Signed)
   NAME:  Mary Flynn, Mary Flynn                             ACCOUNT NO.:  [id number]   MEDICAL RECORD NO.:  [id number]                   PATIENT TYPE:  AMB   LOCATION:  ENDO                                 FACILITY:  [facility]   PHYSICIAN:  Wilhemina Bonito. Marina Goodell, M.D. [facility]             DATE OF BIRTH:  [birth year]   DATE OF PROCEDURE:  10/02/2002  DATE OF DISCHARGE:  10/02/2002                                 OPERATIVE REPORT   PROCEDURE:  Esophageal manometry.   INDICATION:  Dysphagia and cough.   GASTROENTEROLOGIST:  Wilhemina Bonito. Marina Goodell, M.D.   HISTORY:  This is a 76 year old female who has undergone extensive workup  regarding dysphagia and cough.  The cause of her problem has been uncertain,  though reflux has been alleged.  She underwent a modified barium swallow  with no evidence of microaspiration.  She is now for esophageal manometry as  well as dual-channel pH study off GI medication.   PROCEDURE:  The patient reported to the [facility]  [facility]atory October 02, 2002.  Manometry probe was placed in the standard  manner.  The patient tolerated the procedure well.  The findings were as  follows:   1. The upper esophageal sphincter demonstrated normal relaxation and normal     coordination.  2. The esophageal body demonstrated normal peristalsis as manifested by     normal wave amplitude and normal wave propagation.  3. The lower esophageal sphincter resting pressure was normal at 17.7 mmHg.     In addition, normal and complete relaxation of the lower esophageal     sphincter with wet swallowing was demonstrated.   IMPRESSION:  Normal esophageal manometry.  No explanation for symptoms.   RECOMMENDATIONS:  Complete pH study as previously recommended.                                               Wilhemina Bonito. Marina Goodell, M.D. [facility]    JNP/MEDQ  D:  10/23/2002  T:  10/23/2002  Job:  [id number]   cc:   Charlaine Dalton. Sherene Sires, M.D. [facility]   Angelena Sole, M.D. [facility]

## 2010-07-11 NOTE — Procedures (Signed)
NAMENORVELL, URESTE                   ACCOUNT NO.:  [id number]   MEDICAL RECORD NO.:  [id number]          PATIENT TYPE:  REC   LOCATION:  OREH                         FACILITY:  [facility]   PHYSICIAN:  Erick Colace, M.D.DATE OF BIRTH:  Jun 23, 1934   DATE OF PROCEDURE:  05/08/2004  DATE OF DISCHARGE:                                 OPERATIVE REPORT   REASON FOR VISIT:  Worsening of old problem i.e. TMJ as well as trigger  point injections.   CURRENT PAIN:  3/10 ranging up to 5.  Pain interference general activity 4-  5, relationship to other people 2-3, __________ 3-4.  Pain diagram  indicating pain most concentrated at the neck and TMJ area.   PHYSICAL EXAMINATION:  Positive for tender points bilateral upper traps and  left C6 paraspinal at the lateral border of the trapezius.  VITAL SIGNS:  Blood pressure 147/70, pulse 107, respirations 16, O2  saturations 97% on room air.   IMPRESSION:  Myofascial pain disorder head, neck, shoulder which is related  to temporomandibular joint dysfunction.   PLAN:  1.  Will continue physical therapy for above.  2.  Trigger point injections today.   ADDENDUM:  Trigger point injection.  Informed consent was obtained after  describing the risks and benefits of the procedure to the patient. Areas  marked, bilateral upper trap, mid clavicular line as well as left C6 lateral  border of trap about 3 cm lateral spinous process. Prepped with alcohol,  entered with 25 gauge 1.5 inch needle inserted obliquely. Lidocaine 1%  injected into each of the upper trap areas and 0.5 was injected at the C6  paraspinal area then trigger point deactivation was carried out using needle  inserting to more than half needle tips i.e. 0.75 inches.   The patient tolerated the procedure well. She had a positive twitch response  right upper trap.   I will see her back in approximately one month. Monitor therapy progress and  possibly repeat trigger point  injections.      AEK/MEDQ  D:  05/08/2004 16:43:34  T:  05/08/2004 20:45:36  Job:  [id number]

## 2010-07-11 NOTE — Group Therapy Note (Signed)
MEDICAL RECORD NUMBER:  [id number]   DATE OF BIRTH:  1934-11-15   DATE OF SERVICE:  March 13, 2004   REASON FOR CONSULTATION:  Pain all over.   HISTORY:  [age]-year-old female with complaints of pain throughout her  body starting from the top of her head down to her feet, particular problem  areas for her are her TMJs bilaterally, her upper back area, her low back  area and plantar surfaces of her feet.  She is able to do all her self-care,  her own mobility and does continue to exercise fairly vigorously considering  her age, she ambulates about 2 miles per day, she goes to a gym where she  does some weight training, __lat pull downs________  , some stationery  cycling including Aerodyne and some other things that she could not quite  describe to me.  She has been seeing a chiropractic for a number of years in  regards to myofascial pain which is felt to be related to her  temporomandibular dysfunction and manifesting from head/neck and shoulder  pain.  She states in the past she has also been treated for migraine  headaches.  She has used a custom-fitted mouth guard for bruxism and she  lost this unfortunately and trying to get another one.   In terms of her back pain/neck pain, she has also had hydromassage  treatments while at the chiropractor.  She has also had treatments with  plantar fascia injection through the foot center per Dr. Al Corpus.  She has had  EMG/NCV done per Dr. Jacki Cones of bilateral lower extremities which is  normal.  She has tried Neurontin, she has tried Lyrica and she frankly does  not like side effects of medications and reluctant to try any.   Other past medical history significant for diabetes; she is on metformin for  this.  She states she does not have high blood pressure and yet she is  taking hydrochlorothiazide; she is taking Prozac for depression; she is also  taking B complex, fish oil, vitamin E, Ecotrin 81 mg p.o. once daily,  garlic.   Pain is averaging 4/10, gets as high as 8.  Her pain is worse with walking  in her feet and bending, sitting makes her pain better, doing housework  makes her pain worse but therapy such as exercise makes her pain better.  She also does better with heat.   SOCIAL HISTORY:  Lives with her husband, married.  She states she is  disabled since 2001 due to some retinopathy with decreased vision on the  left side.   OTHER PAST MEDICAL HISTORY:  She does have records indicating esophageal  manometry which was normal done October 02, 2002 and _manometric_________  study done that same day showing no evidence of reflux disease.  She has had  CT head December 29, 2003, mild diffuse cerebral and cerebellar atrophy, this  was done for headaches and also stating that she hit her head.  She has had  a mammography which was negative.  CT pelvis in October 2005 which was  unremarkable.  CT abdomen unremarkable as well.  She had maxillofacial CT  showing no evidence of sinusitis back in October.   REVIEW OF SYSTEMS:  Please see review.   EXAMINATION:  Blood pressure 126/50, pulse 98, respiratory rate 20, O2  saturation 97% room air.   Neck has full range of motion; upper extremity/lower extremity full range of  motion; spine has full range of  motion.  Sensation is normal bilateral upper  and lower extremities.  She has normal strength bilateral upper and lower  extremities.  She has normal neck and lumbar spine range of motion.  She has  negative FABER testing bilaterally.  She has tenderness to palpation  bilateral upper traps as well as upper medial scapula border; she has pain  over TMJ particularly on the left side; she has a palpable click in the left  TMJ.  Her mouth opening is good; her cranial nerves II-XII are intact.  Deep  tendon reflexes are intact bilateral upper and lower extremities.   IMPRESSION:  1.  Myofascial pain syndrome appears to be localized head and neck region.       I do agree with the use of custom-fitted mouth guard.  2.  I think she could benefit from trigger point injections bilateral upper      trap as well as upper medial scapula border.  3.  She may benefit from acupuncture as well.  4.  We would like her to get evaluated for a TENS unit (had used this as      part of a rehab program for shoulder pain).  5.  Would like physical therapy to look over her exercise program that she      does at the gym and make some adjustments as she may be overdoing one      exercise or not doing enough of another (have them do a full evaluation      of that).  6.  I will see her back in about 6 weeks and follow up on therapy progress      as well as do the trigger point injections.  She states that she does      have some transportation difficulties in the next month or so.      AEK/MedQ  D:  03/13/2004 17:11:11  T:  03/13/2004 19:37:07  Job #:  [id number]   cc:   Max T. [address] D[address]  [address]  [place]  [place] [address]  Fax: [phone]   Ernesta Amble, M.D.  [facility]  [address] [place], [place]   Angelena Sole, M.D. [facility]

## 2010-08-04 ENCOUNTER — Ambulatory Visit: Payer: Medicare PPO | Admitting: Physical Therapy

## 2010-08-11 ENCOUNTER — Ambulatory Visit: Payer: Medicare PPO | Attending: Family Medicine | Admitting: Physical Therapy

## 2010-08-11 DIAGNOSIS — M2569 Stiffness of other specified joint, not elsewhere classified: Secondary | ICD-10-CM | POA: Insufficient documentation

## 2010-08-11 DIAGNOSIS — M25569 Pain in unspecified knee: Secondary | ICD-10-CM | POA: Insufficient documentation

## 2010-08-11 DIAGNOSIS — M542 Cervicalgia: Secondary | ICD-10-CM | POA: Insufficient documentation

## 2010-08-11 DIAGNOSIS — IMO0001 Reserved for inherently not codable concepts without codable children: Secondary | ICD-10-CM | POA: Insufficient documentation

## 2010-08-11 DIAGNOSIS — M545 Low back pain, unspecified: Secondary | ICD-10-CM | POA: Insufficient documentation

## 2010-08-14 ENCOUNTER — Ambulatory Visit: Payer: Medicare PPO | Admitting: Physical Therapy

## 2010-08-15 ENCOUNTER — Other Ambulatory Visit: Payer: Self-pay | Admitting: *Deleted

## 2010-08-15 MED ORDER — LOSARTAN POTASSIUM 100 MG PO TABS: 100.0000 mg | ORAL_TABLET | Freq: Every day | ORAL | Status: DC

## 2010-08-15 NOTE — Telephone Encounter (Signed)
Discuss with patient who states that med is too expensive and would like to have a cheaper alternative. Pt advise new Rx sent in to pharmacy and she will need to schedule follow up in 2-3 week after starting new med. Pt ok will call for BP check once she starts med.

## 2010-08-18 ENCOUNTER — Ambulatory Visit (INDEPENDENT_AMBULATORY_CARE_PROVIDER_SITE_OTHER): Payer: Medicare PPO | Admitting: Family Medicine

## 2010-08-18 ENCOUNTER — Encounter: Payer: Self-pay | Admitting: Family Medicine

## 2010-08-18 VITALS — BP 140/70 | HR 101 | Temp 98.9°F | Wt 139.2 lb

## 2010-08-18 DIAGNOSIS — R05 Cough: Secondary | ICD-10-CM

## 2010-08-18 DIAGNOSIS — R059 Cough, unspecified: Secondary | ICD-10-CM

## 2010-08-18 NOTE — Progress Notes (Signed)
  Subjective:     Kalesha Irving is a 76 y.o. female here for evaluation of a cough. Onset of symptoms was 9 months ago. Symptoms have been stable since that time. The cough is dry and is aggravated by stress. Associated symptoms include: none. Patient does not have a history of asthma. Patient does not have a history of environmental allergens. Patient has not traveled recently. Patient does not have a history of smoking. Patient has had a previous chest x-ray. Patient has not had a PPD done.  The following portions of the patient's history were reviewed and updated as appropriate: allergies, current medications, past family history, past medical history, past social history, past surgical history and problem list.  Review of Systems Pertinent items are noted in HPI.    Objective:    Oxygen saturation 96% on room air BP 140/70  Pulse 101  Temp(Src) 98.9 F (37.2 C) (Oral)  Wt 139 lb 3.2 oz (63.141 kg)  SpO2 96% General appearance: alert, cooperative, appears stated age and no distress Ears: normal TM's and external ear canals both ears Nose: Nares normal. Septum midline. Mucosa normal. No drainage or sinus tenderness. Throat: lips, mucosa, and tongue normal; teeth and gums normal Lungs: clear to auscultation bilaterally Heart: regular rate and rhythm, S1, S2 normal, no murmur, click, rub or gallop Extremities: extremities normal, atraumatic, no cyanosis or edema Lymph nodes: Cervical, supraclavicular, and axillary nodes normal.    Assessment:    Cough    Plan:    Explained lack of efficacy of antibiotics in viral disease. Antitussives per medication orders. Avoid exposure to tobacco smoke and fumes. B-agonist inhaler. Call if shortness of breath worsens, blood in sputum, change in character of cough, development of fever or chills, inability to maintain nutrition and hydration. Avoid exposure to tobacco smoke and fumes. Pulmonary consult. con't antihistamine and PPI  Pt has seen  GI and ENT and has gotten no relief

## 2010-08-18 NOTE — Patient Instructions (Signed)
Cough The body has a normal cough reflex. This helps expel mucous secretions and irritants from the lung and airway. Coughing helps to protect you from pneumonia. Most coughs are caused by virus infections. These often take 2 to 3 weeks to clear up. Cough spasms are periods of continuous coughing that go on for several minutes. If a cough can be controlled with medicine, and it clears up in 2 to 3 weeks, no special studies or treatment is usually needed. A persistent cough lasting longer than 3 to 4 weeks requires medical evaluation. X-rays and other tests may be needed to determine the cause. A chronic cough is most often due to smoking, post-nasal drip from sinus disease, or asthma. Esophageal reflux disease or GERD can also lead to a chronic cough. ACE inhibitor blood pressure drugs may also cause a cough. Some infections like whooping cough can cause a persistent cough that lasts for weeks. Treatment of cough includes measures to loosen the cough and thin the mucus. Warm liquids, cough drops, and non prescription cough medicine will help reduce dry hacking cough. Use a humidifier if necessary to moisten the air in your room. Some cough medicines also have antihistamines, decongestants, or alcohol in them, but there is no proof that any of these help control cough. Cough suppressants may be used as directed by your caregiver. Keep in mind that coughing helps clear mucus and infection out of the respiratory tract. It is best to use cough suppressants only when rest is needed. For children under the age of 4 years, use cough suppressants only as directed by your child's caregiver. Prescription cough medicine or those with dextromethorphan (DM) should be reserved for dry coughs that prevent sleep or cause spasms or chest pain. Avoid any exposure to cigarette smoke because this will worsen the cough or make it last much longer.  SEEK IMMEDIATE MEDICAL CARE IF YOU OR YOUR CHILD DEVELOPS:  Increased difficulty  breathing.   A high fever or severe chest pain.   A cough which has not improved within 3 weeks.  Document Released: 02/09/2005 Document Re-Released: 05/06/2009 [facility] Patient Information 2011 [place], [place].

## 2010-08-19 ENCOUNTER — Encounter: Payer: Self-pay | Admitting: Internal Medicine

## 2010-08-19 ENCOUNTER — Ambulatory Visit (INDEPENDENT_AMBULATORY_CARE_PROVIDER_SITE_OTHER): Payer: Medicare PPO | Admitting: Internal Medicine

## 2010-08-19 VITALS — BP 136/78 | HR 97 | Ht 60.0 in | Wt 142.2 lb

## 2010-08-19 DIAGNOSIS — R05 Cough: Secondary | ICD-10-CM

## 2010-08-19 NOTE — Patient Instructions (Signed)
Sample Advair 250/50 -      1 puff and rinse mouth, twice daily  Then try sample Spiriva , 1 daily  Order- PFT

## 2010-08-19 NOTE — Progress Notes (Signed)
Subjective:    Patient ID: Tatayana Flynn, female    DOB: 08-Jul-1934, 76 y.o.   MRN: [id number]  HPI 08/19/10- 72 yoF here with husband on kind referral by Dr Laury Axon because of cough. Chronic nonproductive cough with indefinite date of onset has been present for years as an adult. She describes previous evaluation at [facility], By Dr [facility] ENT, and by Dr Sherene Sires Pulmonary. At one point cough resolved spontaneously and abruptly, was gone for a year, returned for 6 months, was gone for 3 months and has been back now x 4 months. Workup including allergy testing,barium swallow,  24hour pH probe nonrevealing. Denies pattern of season or climate. Hydrocodone permits sleep. Can lie supine, but coughs on either side. Failed inhalers, home remedies. Nuts, sweets, colas and spices seem to make cough worse. Talking and stress make her cough. She blames dust from her telephone.  Sips of hot water and cough drops seem to help. Now taking Xyzal. Swims daily,  Review of Systems Constitutional:   No weight loss, night sweats,  Fevers, chills, fatigue, lassitude. HEENT:   No headaches,  Difficulty swallowing,  Tooth/dental problems,                                Sore throat, some difficulty swallowing               No sneezing, itching, ear ache, nasal congestion, post nasal drip,   CV:  No chest pain,  Orthopnea, PND, swelling in lower extremities, anasarca, dizziness, palpitations  GI  No heartburn, indigestion, abdominal pain, nausea, vomiting, diarrhea, change in bowel habits, loss of appetite  Resp: No shortness of breath with exertion or at rest.  No excess mucus,,  No coughing up of blood.  No change in color of mucus.  No wheezing.   Skin: no rash or lesions.  GU: no dysuria, change in color of urine, no urgency or frequency.  No flank pain.  MS:  No joint pain or swelling.  No decreased range of motion.  No back pain.  Psych:  No change in mood or affect. No depression or anxiety.  No memory loss.        Objective:   Physical Exam    General- Alert, Oriented, Affect-appropriate, Distress- none acute   Medium build  Skin- rash-none, lesions- none, excoriation- none  Lymphadenopathy- none  Head- atraumatic  Eyes- Gross vision intact, PERRLA, conjunctivae clear, secretions  Ears- Hearing, canals, Tm- normal  Nose- Clear, No- Septal dev, mucus, polyps, erosion, perforation     Occasional snort  Throat- Mallampati II-III , mucosa clear , drainage- none, tonsils- atrophic  Neck- flexible , trachea midline, no stridor , thyroid nl, carotid no bruit  Chest - symmetrical excursion , unlabored     Heart/CV- RRR , no murmur , no gallop  , no rub, nl s1 s2                     - JVD- none , edema- none, stasis changes- none, varices- none     Lung- clear to P&A, wheeze- none, cough- none , dullness-none, rub- none                  Has not coughed significantly here     Chest wall-   Abd- tender-no, distended-no, bowel sounds-present, HSM- no  Br/ Gen/ Rectal- Not done, not indicated  Extrem- cyanosis- none, clubbing, none,  atrophy- none, strength- nl  Neuro- grossly intact to observation      Assessment & Plan:

## 2010-08-19 NOTE — Assessment & Plan Note (Addendum)
Nonspecific chronic cough. She emphasizes the many tests and therapeutic trials with no benefit. She states stress is lkely part of this.  We will get PFT and try Advair sample, which she wants. Then we will try Spiriva sample for anticholinergic/ vagal blocker. She says she tried it in past, but is unclear whether it seemed to help. I will try to look again through old records.  A significant percentage of chronic cough cases are never solved, once the common measures are tried.

## 2010-08-21 ENCOUNTER — Ambulatory Visit: Payer: Medicare PPO | Admitting: Physical Therapy

## 2010-08-22 ENCOUNTER — Ambulatory Visit: Payer: Medicare PPO | Admitting: Physical Therapy

## 2010-08-22 ENCOUNTER — Other Ambulatory Visit: Payer: Self-pay | Admitting: Family Medicine

## 2010-08-22 ENCOUNTER — Telehealth: Payer: Self-pay | Admitting: Internal Medicine

## 2010-08-22 MED ORDER — ZOLPIDEM TARTRATE 5 MG PO TABS: 5.0000 mg | ORAL_TABLET | Freq: Every evening | ORAL | Status: DC | PRN

## 2010-08-22 NOTE — Telephone Encounter (Signed)
Last seen 08/18/10 and filled 04/29/10 # 90  Please advise    KP

## 2010-08-22 NOTE — Telephone Encounter (Signed)
Faxed to [facility]

## 2010-08-22 NOTE — Telephone Encounter (Signed)
Spoke with patient-stated she was having a dust in the throat feeling after using Advair and rinsing after use. She continued to have uncomfortable feelings and increased cough and stopped taking the medication. Advised patient she did the right thing and to try the sprivia that CY gave her to try as well. Pt has PFT on Tuesday at 1pm and will let me know that day how she is feeling with Sprivia.

## 2010-08-26 ENCOUNTER — Ambulatory Visit: Payer: Medicare PPO | Attending: Family Medicine | Admitting: Physical Therapy

## 2010-08-26 ENCOUNTER — Ambulatory Visit (INDEPENDENT_AMBULATORY_CARE_PROVIDER_SITE_OTHER): Payer: Medicare PPO | Admitting: Internal Medicine

## 2010-08-26 ENCOUNTER — Other Ambulatory Visit: Payer: Self-pay | Admitting: Family Medicine

## 2010-08-26 ENCOUNTER — Telehealth: Payer: Self-pay | Admitting: *Deleted

## 2010-08-26 DIAGNOSIS — R05 Cough: Secondary | ICD-10-CM

## 2010-08-26 DIAGNOSIS — IMO0001 Reserved for inherently not codable concepts without codable children: Secondary | ICD-10-CM | POA: Insufficient documentation

## 2010-08-26 DIAGNOSIS — M545 Low back pain, unspecified: Secondary | ICD-10-CM | POA: Insufficient documentation

## 2010-08-26 DIAGNOSIS — M25569 Pain in unspecified knee: Secondary | ICD-10-CM | POA: Insufficient documentation

## 2010-08-26 DIAGNOSIS — M542 Cervicalgia: Secondary | ICD-10-CM | POA: Insufficient documentation

## 2010-08-26 DIAGNOSIS — M2569 Stiffness of other specified joint, not elsewhere classified: Secondary | ICD-10-CM | POA: Insufficient documentation

## 2010-08-26 LAB — PULMONARY FUNCTION TEST

## 2010-08-26 MED ORDER — TIOTROPIUM BROMIDE MONOHYDRATE 18 MCG IN CAPS: 18.0000 ug | ORAL_CAPSULE | Freq: Every day | RESPIRATORY_TRACT | Status: DC

## 2010-08-26 NOTE — Telephone Encounter (Signed)
Spoke with Pt who indicated that she no longer uses [facility] and would like all Rx from now on to be sent to [facility].

## 2010-08-26 NOTE — Progress Notes (Signed)
PFT done today. 

## 2010-08-26 NOTE — Telephone Encounter (Signed)
Per CY-okay to send Rx as requested. Pt is aware.

## 2010-08-28 ENCOUNTER — Telehealth: Payer: Self-pay | Admitting: Internal Medicine

## 2010-08-28 ENCOUNTER — Ambulatory Visit: Payer: Medicare PPO | Admitting: Physical Therapy

## 2010-08-28 MED ORDER — IPRATROPIUM BROMIDE HFA 17 MCG/ACT IN AERS: 2.0000 | INHALATION_SPRAY | RESPIRATORY_TRACT | Status: DC | PRN

## 2010-08-28 NOTE — Telephone Encounter (Signed)
Did the Spiriva seem to help some? Lrt's try Atrovent HFA inhaler, 2 puffs every 4 hours if needed-  # 1, refill prn

## 2010-08-28 NOTE — Telephone Encounter (Signed)
[email or web address].Rikia Sukhu, CMA  

## 2010-08-28 NOTE — Telephone Encounter (Signed)
CY, please advise on what else we can try patient on as Spiriva is too costly for patient and pt is unable to use Advair due to side effects(listed in her allergy list). Thanks.

## 2010-08-28 NOTE — Telephone Encounter (Signed)
Pt says the Spiriva did seem to help some but is too expensive. She is aware of the new RX for Atrovent HFA and this has been called to the pharmacy. She wants to know if she should try to continue Spiriva also and if samples can be given. Pls advise.

## 2010-08-28 NOTE — Telephone Encounter (Signed)
We don't get enough Spiriva samples to supply her long term. Atrovent HFA is in the same general drug class, but should be cheaper. Don't use them both concurrently.

## 2010-08-29 NOTE — Telephone Encounter (Signed)
Patient returning call.

## 2010-08-29 NOTE — Telephone Encounter (Signed)
Called, spoke with pt.  She is aware of CDY's statement and verbalized understanding of this.

## 2010-09-09 ENCOUNTER — Ambulatory Visit: Payer: Medicare PPO | Admitting: Internal Medicine

## 2010-10-01 ENCOUNTER — Other Ambulatory Visit: Payer: Self-pay | Admitting: Family Medicine

## 2010-10-01 NOTE — Telephone Encounter (Signed)
Last OV 08-18-10, last filled 08-22-10 #90

## 2010-10-01 NOTE — Telephone Encounter (Signed)
Refill x1 

## 2010-11-07 MED ORDER — NON FORMULARY
0.6500 mL | Freq: Once | Status: AC
  Administered 2010-11-07: 0.65 mL via SUBCUTANEOUS

## 2011-01-16 ENCOUNTER — Other Ambulatory Visit: Payer: Self-pay | Admitting: Family Medicine

## 2011-01-28 ENCOUNTER — Encounter: Payer: Self-pay | Admitting: Family Medicine

## 2011-01-29 ENCOUNTER — Encounter: Payer: Self-pay | Admitting: Family Medicine

## 2011-01-29 ENCOUNTER — Ambulatory Visit (INDEPENDENT_AMBULATORY_CARE_PROVIDER_SITE_OTHER): Payer: Medicare PPO | Admitting: Family Medicine

## 2011-01-29 VITALS — BP 118/70 | HR 109 | Temp 98.3°F | Wt 141.0 lb

## 2011-01-29 DIAGNOSIS — E785 Hyperlipidemia, unspecified: Secondary | ICD-10-CM

## 2011-01-29 DIAGNOSIS — E119 Type 2 diabetes mellitus without complications: Secondary | ICD-10-CM

## 2011-01-29 DIAGNOSIS — G4700 Insomnia, unspecified: Secondary | ICD-10-CM

## 2011-01-29 DIAGNOSIS — Z780 Asymptomatic menopausal state: Secondary | ICD-10-CM

## 2011-01-29 DIAGNOSIS — I10 Essential (primary) hypertension: Secondary | ICD-10-CM

## 2011-01-29 MED ORDER — ZOLPIDEM TARTRATE 5 MG PO TABS: ORAL_TABLET | ORAL | Status: DC

## 2011-01-29 MED ORDER — LOSARTAN POTASSIUM 100 MG PO TABS: 100.0000 mg | ORAL_TABLET | Freq: Every day | ORAL | Status: DC

## 2011-01-29 MED ORDER — ZOLPIDEM TARTRATE 10 MG PO TABS: 10.0000 mg | ORAL_TABLET | Freq: Every evening | ORAL | Status: DC | PRN

## 2011-01-29 NOTE — Patient Instructions (Signed)

## 2011-01-29 NOTE — Progress Notes (Signed)
  Subjective:    Patient here for follow-up of elevated blood pressure.  She is not exercising and is adherent to a low-salt diet.  Blood pressure is well controlled at home. Cardiac symptoms: none. Patient denies: chest pain, chest pressure/discomfort, claudication, dyspnea, exertional chest pressure/discomfort, fatigue, irregular heart beat, lower extremity edema, near-syncope, orthopnea, palpitations, paroxysmal nocturnal dyspnea, syncope and tachypnea. Cardiovascular risk factors: advanced age (older than 63 for men, 20 for women), diabetes mellitus, dyslipidemia, hypertension and sedentary lifestyle. Use of agents associated with hypertension: none. History of target organ damage: none.  The following portions of the patient's history were reviewed and updated as appropriate: allergies, current medications, past family history, past medical history, past social history, past surgical history and problem list.  Review of Systems Pertinent items are noted in HPI.     Objective:    BP 118/70  Pulse 109  Temp(Src) 98.3 F (36.8 C) (Oral)  Wt 141 lb (63.957 kg)  SpO2 96%  BP 118/70  Pulse 109  Temp(Src) 98.3 F (36.8 C) (Oral)  Wt 141 lb (63.957 kg)  SpO2 96% General appearance: alert, cooperative, appears stated age and no distress Lungs: clear to auscultation bilaterally Heart: regular rate and rhythm, S1, S2 normal, no murmur, click, rub or gallop Extremities: extremities normal, atraumatic, no cyanosis or edema   Assessment:    Hypertension, normal blood pressure . Evidence of target organ damage: none.    Plan:    Medication: no change. Dietary sodium restriction. Regular aerobic exercise. Check blood pressures 2-3 times weekly and record.

## 2011-02-03 ENCOUNTER — Ambulatory Visit (INDEPENDENT_AMBULATORY_CARE_PROVIDER_SITE_OTHER)
Admission: RE | Admit: 2011-02-03 | Discharge: 2011-02-03 | Disposition: A | Discharge status: Home / Self Care | Payer: Medicare PPO | Source: Ambulatory Visit

## 2011-02-03 ENCOUNTER — Telehealth: Payer: Self-pay | Admitting: Internal Medicine

## 2011-02-03 DIAGNOSIS — Z780 Asymptomatic menopausal state: Secondary | ICD-10-CM

## 2011-02-03 DIAGNOSIS — M810 Age-related osteoporosis without current pathological fracture: Secondary | ICD-10-CM

## 2011-02-03 NOTE — Telephone Encounter (Signed)
Spoke to TD and advise to re advise pt of Dr. Roxy Cedar previous note see below:  Waymon Budge, MD 08/28/2010 4:42 PM Signed  We don't get enough Spiriva samples to supply her long term. Atrovent HFA is in the same general drug class, but should be cheaper. Don't use them both concurrently.    Pt states her insurance will not cover Atrovent HFA and if we will not provide Spiriva sample she will "make do" with what she has and declined to try Atrovent HFA. Will forward to Dr. Maple Hudson for an Spencer.

## 2011-02-03 NOTE — Telephone Encounter (Signed)
Noted. Can make f/u appointment if needed.

## 2011-02-03 NOTE — Telephone Encounter (Signed)
Pt states she is scheduled for bilat cataract surgery 02/10/11 and goes in for pre-op 02-04-11. C/o cough with intermittent clear mucus. Has been taking Spiriva daily as needed due to cost. Is traveling back from [facility] due to sons health and also has a PCP there that provided a sample of Spiriva. States breathing is "okay sometimes, sometimes I don't know". I asked pt if Atrovent HFA worked and she advised she does not recall because she has tried so many other medications and Spiriva is the only one that does work for her breathing and cough.

## 2011-02-06 ENCOUNTER — Telehealth: Payer: Self-pay | Admitting: Family Medicine

## 2011-02-06 NOTE — Telephone Encounter (Signed)
She should see ortho--- to have back pain evaluated

## 2011-02-06 NOTE — Telephone Encounter (Signed)
FYI......Marland KitchenPatient calling, states she continues to have back pain, has found the following place that wants to send her some sort of jacket that will help her with her back pain, but they need Dr. Ernst Spell approval.  The place is called [facility], just as fyi....thier phone is [phone], and they already have, or will be faxing something to our fax for Dr. Laury Axon to review and sign if she approves.

## 2011-02-06 NOTE — Telephone Encounter (Signed)
Ms left to call the office    KP

## 2011-02-11 ENCOUNTER — Telehealth: Payer: Self-pay | Admitting: Internal Medicine

## 2011-02-11 NOTE — Telephone Encounter (Signed)
Appointment made for 02-12-11 at 345 pm with CY.

## 2011-02-12 ENCOUNTER — Ambulatory Visit (INDEPENDENT_AMBULATORY_CARE_PROVIDER_SITE_OTHER)
Admission: RE | Admit: 2011-02-12 | Discharge: 2011-02-12 | Disposition: A | Discharge status: Home / Self Care | Payer: Medicare PPO | Source: Ambulatory Visit | Attending: Internal Medicine | Admitting: Internal Medicine

## 2011-02-12 ENCOUNTER — Ambulatory Visit (INDEPENDENT_AMBULATORY_CARE_PROVIDER_SITE_OTHER): Payer: Medicare PPO | Admitting: Internal Medicine

## 2011-02-12 ENCOUNTER — Encounter: Payer: Self-pay | Admitting: Internal Medicine

## 2011-02-12 VITALS — BP 126/82 | HR 96 | Ht 59.0 in | Wt 143.2 lb

## 2011-02-12 DIAGNOSIS — R05 Cough: Secondary | ICD-10-CM

## 2011-02-12 DIAGNOSIS — K219 Gastro-esophageal reflux disease without esophagitis: Secondary | ICD-10-CM

## 2011-02-12 MED ORDER — BENZONATATE 200 MG PO CAPS: 200.0000 mg | ORAL_CAPSULE | Freq: Three times a day (TID) | ORAL | Status: AC | PRN

## 2011-02-12 NOTE — Progress Notes (Signed)
Patient ID: Mary Flynn, female    DOB: [birth year], 76 y.o.   MRN: [id number]  HPI 08/19/10- 72 yoF here with husband on kind referral by Dr Laury Axon because of cough. Chronic nonproductive cough with indefinite date of onset has been present for years as an adult. She describes previous evaluation at [facility], By Dr [facility], and by Dr Sherene Sires Pulmonary. At one point cough resolved spontaneously and abruptly, was gone for a year, returned for 6 months, was gone for 3 months and has been back now x 4 months. Workup including allergy testing,barium swallow,  24hour pH probe nonrevealing. Denies pattern of season or climate. Hydrocodone permits sleep. Can lie supine, but coughs on either side. Failed inhalers, home remedies. Nuts, sweets, colas and spices seem to make cough worse. Talking and stress make her cough. She blames dust from her telephone.  Sips of hot water and cough drops seem to help. Now taking Xyzal. Swims daily,  02/12/11- 76 yoF never smoker followed for chronic cough. Here with husband. Since last here, went to [place] for "[place] medicine" then told her it wouldn't work without meat- she is vegetarian.  She coughs with exposure to spices, cold, sweets. Cough if food is in mouth. Some variable irritation in back of throat. Helps to sip very hot liquids. Coughs until she retches episodically. Treated narrow angle glaucoma discussed w/ consideration of sustained use of Spiriva. Sample trial of Spiriva helped a little.  PFT 08/31/09- normal except DLCO 45%. No hx of DVT or CHF.   Review of Systems Constitutional:   No-   weight loss, night sweats, fevers, chills, fatigue, lassitude. HEENT:   No-  headaches, difficulty swallowing, tooth/dental problems, sore throat,       No-  sneezing, itching, ear ache, nasal congestion, post nasal drip,  CV:  No-   chest pain, orthopnea, PND, swelling in lower extremities, anasarca, dizziness, palpitations Resp: No-   shortness of breath with exertion or at  rest.              No-   productive cough,  + non-productive cough,  No- coughing up of blood.              No-   change in color of mucus.  No- wheezing.   Skin: No-   rash or lesions. GI:  No-   heartburn, indigestion, abdominal pain, nausea, vomiting, diarrhea,                 change in bowel habits, loss of appetite GU:  MS:  No-   joint pain or swelling.  No- decreased range of motion.  No- back pain. Neuro-     nothing unusual Psych:  No- change in mood or affect. No depression or anxiety.  No memory loss.      Objective:  General- Alert, Oriented, Affect-appropriate, Distress- none acute, over weight, steady talker Skin- rash-none, lesions- none, excoriation- none Lymphadenopathy- none Head- atraumatic            Eyes- Gross vision intact, PERRLA, conjunctivae clear secretions            Ears- Hearing, canals-normal            Nose- Clear, no-Septal dev, mucus, polyps, erosion, perforation             Throat- Mallampati III , mucosa clear , drainage- none, tonsils- atrophic Neck- flexible , trachea midline, no stridor , thyroid nl, carotid no bruit Chest - symmetrical  excursion , unlabored           Heart/CV- RRR , no murmur , no gallop  , no rub, nl s1 s2                           - JVD- none , edema- none, stasis changes- none, varices- none           Lung- clear to P&A, wheeze- none, cough- only once, then made loud sucking noises against the back of her throat , dullness-none, rub- none           Chest wall-  Abd- tender-no, distended-no, bowel sounds-present, HSM- no Br/ Gen/ Rectal- Not done, not indicated Extrem- cyanosis- none, clubbing, none, atrophy- none, strength- nl Neuro- grossly intact to observation

## 2011-02-12 NOTE — Patient Instructions (Addendum)
PPD skin test  Order CXR- dx cough  Script - try benzonatate pearls for cough as needed

## 2011-02-13 NOTE — Telephone Encounter (Signed)
Spoke with patient and she stated she is in [place] and she is in really bad pain. She said her back is hurting and she really needs that jacket, she stated she seen Ortho and had PT in [place] and it did not help, she has also had PT in [place] and it never did work for her. She wanted to know if you could please call so she can get the jacket.   Please advise    KP

## 2011-02-13 NOTE — Telephone Encounter (Signed)
i dont think i got anything from that company she mentioned.  If I did-- we can send it in with back pain as diagnosis

## 2011-02-13 NOTE — Telephone Encounter (Signed)
msg left for patient to call and have the fax a form since we did not receive the first one.     KP

## 2011-02-15 NOTE — Assessment & Plan Note (Signed)
We will check CXR and PPD, then see if benzonatate can interrupt the cycle.

## 2011-02-15 NOTE — Assessment & Plan Note (Signed)
She is not describing reflux symptoms now, but educated on relation between lower third reflux and reflex mediated cough.

## 2011-02-16 NOTE — Progress Notes (Signed)
Addended by: Reynaldo Minium C on: 02/16/2011 10:17 AM   Modules accepted: Orders

## 2011-02-16 NOTE — Progress Notes (Signed)
Quick Note:  Pt aware of results. ______ 

## 2011-02-18 ENCOUNTER — Ambulatory Visit (INDEPENDENT_AMBULATORY_CARE_PROVIDER_SITE_OTHER): Payer: Medicare PPO | Admitting: Internal Medicine

## 2011-02-18 DIAGNOSIS — R7611 Nonspecific reaction to tuberculin skin test without active tuberculosis: Secondary | ICD-10-CM

## 2011-02-20 ENCOUNTER — Telehealth: Payer: Self-pay | Admitting: Internal Medicine

## 2011-02-20 NOTE — Telephone Encounter (Signed)
Called and spoke with Onalee Hua at [employer] and he will fax over another form.    KP

## 2011-02-20 NOTE — Telephone Encounter (Signed)
I spoke with spouse and he states their son is very ill and is int he hospital and pt was not able to come in and have TB test read that was placed on 02/12/11. He states when pt can they will call back to have another TB test done. Will forward to Dr. Maple Hudson as an fyi

## 2011-03-26 ENCOUNTER — Telehealth: Payer: Self-pay | Admitting: Internal Medicine

## 2011-03-26 ENCOUNTER — Ambulatory Visit: Payer: Medicare PPO | Admitting: Family Medicine

## 2011-03-26 MED ORDER — MOMETASONE FUROATE 220 MCG/INH IN AEPB: 2.0000 | INHALATION_SPRAY | Freq: Every day | RESPIRATORY_TRACT | Status: DC

## 2011-03-26 MED ORDER — PROMETHAZINE-CODEINE 6.25-10 MG/5ML PO SYRP: 5.0000 mL | ORAL_SOLUTION | Freq: Four times a day (QID) | ORAL | Status: DC | PRN

## 2011-03-26 NOTE — Telephone Encounter (Signed)
Called and spoke with pt.  Informed her of CY's recs and rx sent to pharmacy.   

## 2011-03-26 NOTE — Telephone Encounter (Signed)
Per CY-okay to give Asmanex 2 puffs and rinse daily and Phenergan with codeine cough syrup #231ml 1 tsp every 6 hours prn cough no refills.

## 2011-03-26 NOTE — Telephone Encounter (Signed)
Spoke with pt. She c/o worsening cough x 2 wks. Cough is non prod and is esp worse at hs. She states has trouble with sleeping due to cough. Tessalon pearles not helping at all. She states that she is scheduled to have cataract extraction next wk and her eye doctor has rec that she get the cough under control prior to this. She states that the eye doctor also rec that we give her a sample of asmanex to try. Please advise, thanks!

## 2011-04-02 ENCOUNTER — Ambulatory Visit (INDEPENDENT_AMBULATORY_CARE_PROVIDER_SITE_OTHER): Payer: Medicare Other | Admitting: Family Medicine

## 2011-04-02 ENCOUNTER — Encounter: Payer: Self-pay | Admitting: Family Medicine

## 2011-04-02 ENCOUNTER — Ambulatory Visit: Payer: Medicare PPO | Admitting: Family Medicine

## 2011-04-02 VITALS — BP 122/70 | HR 85 | Temp 98.0°F | Wt 141.8 lb

## 2011-04-02 DIAGNOSIS — R05 Cough: Secondary | ICD-10-CM

## 2011-04-02 MED ORDER — ZOLPIDEM TARTRATE 10 MG PO TABS: 10.0000 mg | ORAL_TABLET | Freq: Every evening | ORAL | Status: DC | PRN

## 2011-04-02 MED ORDER — MOMETASONE FUROATE 220 MCG/INH IN AEPB: 2.0000 | INHALATION_SPRAY | Freq: Every day | RESPIRATORY_TRACT | Status: DC

## 2011-04-02 NOTE — Progress Notes (Signed)
Addended by: Arnette Norris on: 04/02/2011 04:04 PM   Modules accepted: Orders

## 2011-04-02 NOTE — Patient Instructions (Signed)

## 2011-04-02 NOTE — Progress Notes (Signed)
  Subjective:     Mary Flynn is a 77 y.o. female here for evaluation of a cough. Onset of symptoms was several weeks ago. Symptoms have been unchanged since that time. The cough is barky and dry and is aggravated by stress. Associated symptoms include: chest pain, chills, fever, heartburn, hemoptysis, night sweats, postnasal drip, shortness of breath, sputum production, weight loss and wheezing. Patient does not have a history of asthma. Patient does have a history of environmental allergens. Patient has traveled recently. Patient does not have a history of smoking. Patient has not had a previous chest x-ray. Patient has not had a PPD done.  She had a doctor in [place] that told her to use a asmanex inhaler but she needed an appointment here to get one.     The following portions of the patient's history were reviewed and updated as appropriate: allergies, current medications, past family history, past medical history, past social history, past surgical history and problem list.  Review of Systems Pertinent items are noted in HPI.    Objective:    Oxygen saturation 96% on room air " BP 122/70  Pulse 85  Temp(Src) 98 F (36.7 C) (Oral)  Wt 141 lb 12.8 oz (64.32 kg)  SpO2 96% General appearance: alert, cooperative, appears stated age and no distress Ears: normal TM's and external ear canals both ears Nose: Nares normal. Septum midline. Mucosa normal. No drainage or sinus tenderness. Throat: lips, mucosa, and tongue normal; teeth and gums normal Neck: no adenopathy, no carotid bruit, no JVD, supple, symmetrical, trachea midline and thyroid not enlarged, symmetric, no tenderness/mass/nodules Lungs: diminished breath sounds bilaterally Heart: S1, S2 normal    Assessment:    bronchospasm    Plan:    Explained lack of efficacy of antibiotics in viral disease. Avoid exposure to tobacco smoke and fumes. B-agonist inhaler. Call if shortness of breath worsens, blood in sputum, change in character  of cough, development of fever or chills, inability to maintain nutrition and hydration. Avoid exposure to tobacco smoke and fumes. Steroid inhaler as ordered.

## 2011-04-09 ENCOUNTER — Telehealth: Payer: Self-pay | Admitting: Family Medicine

## 2011-04-09 NOTE — Telephone Encounter (Signed)
Patients husband called in stating that he would like to pick up a copy of patient's bone density results.

## 2011-04-10 NOTE — Telephone Encounter (Signed)
Pt aware placed up front for pickup.

## 2011-04-13 ENCOUNTER — Telehealth: Payer: Self-pay

## 2011-04-13 NOTE — Telephone Encounter (Signed)
Re-faxed Forms for Back Brace and patient has been made aware it has been done again.       KP

## 2011-04-14 ENCOUNTER — Ambulatory Visit (INDEPENDENT_AMBULATORY_CARE_PROVIDER_SITE_OTHER): Payer: Medicare Other

## 2011-04-14 ENCOUNTER — Telehealth: Payer: Self-pay | Admitting: Internal Medicine

## 2011-04-14 DIAGNOSIS — R05 Cough: Secondary | ICD-10-CM

## 2011-04-14 NOTE — Telephone Encounter (Signed)
Spoke with pt. She states that she has developed cough again and states needs to come in today for "vaccination". She states that when she ges the cough this is what she gets and there is no appt needed. She states that Florentina Addison told her to just come in. Pt extremely difficult to understand due to strong accent. She just kept stating coming in today for vaccine. I asked if she was referring to allergy vaccine and she states no. Please advise if you know anything about "vaccine" for this pt since she states that this was discussed with you, thanks!

## 2011-04-14 NOTE — Telephone Encounter (Signed)
Per Florentina Addison, this is for TB skin test. She can come in today and have this done- I placed on allergy schedule and Katie aware to administer.

## 2011-04-17 ENCOUNTER — Other Ambulatory Visit: Payer: Self-pay | Admitting: Internal Medicine

## 2011-04-17 DIAGNOSIS — R7611 Nonspecific reaction to tuberculin skin test without active tuberculosis: Secondary | ICD-10-CM | POA: Insufficient documentation

## 2011-04-17 NOTE — Progress Notes (Signed)
Patient ID: Mary Flynn, female    DOB: 1934-08-24, 77 y.o.   MRN: [id number]  HPI 08/19/10- 19 yoF here with husband on kind referral by Dr Laury Axon because of cough. Chronic nonproductive cough with indefinite date of onset has been present for years as an adult. She describes previous evaluation at [facility], By Dr [facility] ENT, and by Dr Sherene Sires Pulmonary. At one point cough resolved spontaneously and abruptly, was gone for a year, returned for 6 months, was gone for 3 months and has been back now x 4 months. Workup including allergy testing,barium swallow,  24hour pH probe nonrevealing. Denies pattern of season or climate. Hydrocodone permits sleep. Can lie supine, but coughs on either side. Failed inhalers, home remedies. Nuts, sweets, colas and spices seem to make cough worse. Talking and stress make her cough. She blames dust from her telephone.  Sips of hot water and cough drops seem to help. Now taking Xyzal. Swims daily,  02/12/11- 76 yoF never smoker followed for chronic cough. Here with husband. Since last here, went to [place] for "[place] medicine" then told her it wouldn't work without meat- she is vegetarian.  She coughs with exposure to spices, cold, sweets. Cough if food is in mouth. Some variable irritation in back of throat. Helps to sip very hot liquids. Coughs until she retches episodically. Treated narrow angle glaucoma discussed w/ consideration of sustained use of Spiriva. Sample trial of Spiriva helped a little.  PFT 08/31/09- normal except DLCO 45%. No hx of DVT or CHF.  04/17/11- 76 yoF never smoker followed for chronic cough. + PPD Here with husband She came today just to have PPD read:   POSITIVE PPD  16 mm induration and erythema L arm.  Continues chronic cough, scant white sputum. No fever/ sweats. She doesn't know of TB exposure or BCG vaccination- grew up in [place]. Never treated for TB.  Has been travelling back and forth to [facility] to see ill son- he has just expired.  CXR 02/12/11-  images reviewed. Linear scarring with no granulomatous disease   Review of Systems from 12/20- not reviewed 04/17/11 Constitutional:   No-   weight loss, night sweats, fevers, chills, fatigue, lassitude. HEENT:   No-  headaches, difficulty swallowing, tooth/dental problems, sore throat,       No-  sneezing, itching, ear ache, nasal congestion, post nasal drip,  CV:  No-   chest pain, orthopnea, PND, swelling in lower extremities, anasarca, dizziness, palpitations Resp: No-   shortness of breath with exertion or at rest.              No-   productive cough,  + non-productive cough,  No- coughing up of blood.              No-   change in color of mucus.  No- wheezing.   Skin: No-   rash or lesions. GI:  No-   heartburn, indigestion, abdominal pain, nausea, vomiting, diarrhea,                 change in bowel habits, loss of appetite GU:  MS:  No-   joint pain or swelling.  No- decreased range of motion.  No- back pain. Neuro-     nothing unusual Psych:  No- change in mood or affect. No depression or anxiety.  No memory loss.      Objective:  From 02/12/11- not examined 04/17/11 General- Alert, Oriented, Affect-appropriate, Distress- none acute, over weight, steady talker Skin-  rash-none, lesions- none, excoriation- none Lymphadenopathy- none Head- atraumatic            Eyes- Gross vision intact, PERRLA, conjunctivae clear secretions            Ears- Hearing, canals-normal            Nose- Clear, no-Septal dev, mucus, polyps, erosion, perforation             Throat- Mallampati III , mucosa clear , drainage- none, tonsils- atrophic Neck- flexible , trachea midline, no stridor , thyroid nl, carotid no bruit Chest - symmetrical excursion , unlabored           Heart/CV- RRR , no murmur , no gallop  , no rub, nl s1 s2                           - JVD- none , edema- none, stasis changes- none, varices- none           Lung- clear to P&A, wheeze- none, cough- only once, then made loud sucking  noises against the back of her throat , dullness-none, rub- none           Chest wall-  Abd- tender-no, distended-no, bowel sounds-present, HSM- no Br/ Gen/ Rectal- Not done, not indicated Extrem- cyanosis- none, clubbing, none, atrophy- none, strength- nl Neuro- grossly intact to observation      Addended by: Reynaldo Minium C on: 02/16/2011 10:17 AM Came  Modules accepted: Orders

## 2011-04-17 NOTE — Assessment & Plan Note (Signed)
Chronic cough, scant sputum. CXR linear scarring but no granulomatous scarring.  Plan- Sputum for AFB

## 2011-04-17 NOTE — Patient Instructions (Signed)
Sputum for AFB smear and culture

## 2011-04-20 ENCOUNTER — Other Ambulatory Visit: Payer: Medicare Other

## 2011-04-20 DIAGNOSIS — R7611 Nonspecific reaction to tuberculin skin test without active tuberculosis: Secondary | ICD-10-CM

## 2011-04-21 ENCOUNTER — Telehealth: Payer: Self-pay | Admitting: Family Medicine

## 2011-04-21 MED ORDER — AMLODIPINE BESYLATE 2.5 MG PO TABS: 2.5000 mg | ORAL_TABLET | Freq: Every day | ORAL | Status: DC

## 2011-04-21 MED ORDER — ALENDRONATE SODIUM 70 MG PO TABS: 70.0000 mg | ORAL_TABLET | ORAL | Status: DC

## 2011-04-21 MED ORDER — LOSARTAN POTASSIUM 100 MG PO TABS: 100.0000 mg | ORAL_TABLET | Freq: Every day | ORAL | Status: DC

## 2011-04-21 MED ORDER — SIMVASTATIN 40 MG PO TABS: 40.0000 mg | ORAL_TABLET | Freq: Every evening | ORAL | Status: DC

## 2011-04-21 NOTE — Telephone Encounter (Signed)
Patient only wants to speak to kim regarding her new insurance & her current medications. Please call her @ [phone] Thanks

## 2011-04-22 ENCOUNTER — Encounter: Payer: Self-pay | Admitting: Internal Medicine

## 2011-04-22 ENCOUNTER — Ambulatory Visit (INDEPENDENT_AMBULATORY_CARE_PROVIDER_SITE_OTHER): Payer: Medicare Other | Admitting: Internal Medicine

## 2011-04-22 VITALS — BP 138/78 | HR 82 | Ht 59.0 in | Wt 143.8 lb

## 2011-04-22 DIAGNOSIS — J42 Unspecified chronic bronchitis: Secondary | ICD-10-CM

## 2011-04-22 DIAGNOSIS — F5104 Psychophysiologic insomnia: Secondary | ICD-10-CM

## 2011-04-22 DIAGNOSIS — R7611 Nonspecific reaction to tuberculin skin test without active tuberculosis: Secondary | ICD-10-CM

## 2011-04-22 DIAGNOSIS — G4700 Insomnia, unspecified: Secondary | ICD-10-CM

## 2011-04-22 LAB — RESPIRATORY CULTURE OR RESPIRATORY AND SPUTUM CULTURE: Gram Stain: NONE SEEN

## 2011-04-22 MED ORDER — ZOLPIDEM TARTRATE 5 MG PO TABS: ORAL_TABLET | ORAL | Status: DC

## 2011-04-22 MED ORDER — PROMETHAZINE-CODEINE 6.25-10 MG/5ML PO SYRP: 5.0000 mL | ORAL_SOLUTION | Freq: Four times a day (QID) | ORAL | Status: AC | PRN

## 2011-04-22 NOTE — Patient Instructions (Signed)
The sputum specimen from February 25 will be cultured by the laboratory for 6 weeks  Script for cough syrup  Script for generic Remus Loffler

## 2011-04-22 NOTE — Progress Notes (Signed)
Patient ID: Mary Flynn, female    DOB: 1934/06/25, 77 y.o.   MRN: [id number]  HPI 08/19/10- 74 yoF here with husband on kind referral by Dr Laury Axon because of cough. Chronic nonproductive cough with indefinite date of onset has been present for years as an adult. She describes previous evaluation at [facility], By Dr [facility] ENT, and by Dr Sherene Sires Pulmonary. At one point cough resolved spontaneously and abruptly, was gone for a year, returned for 6 months, was gone for 3 months and has been back now x 4 months. Workup including allergy testing,barium swallow,  24hour pH probe nonrevealing. Denies pattern of season or climate. Hydrocodone permits sleep. Can lie supine, but coughs on either side. Failed inhalers, home remedies. Nuts, sweets, colas and spices seem to make cough worse. Talking and stress make her cough. She blames dust from her telephone.  Sips of hot water and cough drops seem to help. Now taking Xyzal. Swims daily,  02/12/11- 76 yoF never smoker followed for chronic cough. Here with husband. Since last here, went to [place] for "[place] medicine" then told her it wouldn't work without meat- she is vegetarian.  She coughs with exposure to spices, cold, sweets. Cough if food is in mouth. Some variable irritation in back of throat. Helps to sip very hot liquids. Coughs until she retches episodically. Treated narrow angle glaucoma discussed w/ consideration of sustained use of Spiriva. Sample trial of Spiriva helped a little.  PFT 08/31/09- normal except DLCO 45%. No hx of DVT or CHF.  04/17/11- 76 yoF never smoker followed for chronic cough. + PPD Here with husband She came today just to have PPD read:   POSITIVE PPD  16 mm induration and erythema L arm.  Continues chronic cough, scant white sputum. No fever/ sweats. She doesn't know of TB exposure or BCG vaccination- grew up in [place]. Never treated for TB.  Has been travelling back and forth to [facility] to see ill son- he has just expired.  CXR 02/12/11-  images reviewed. Linear scarring with no granulomatous disease   04/22/11- 76 yoF never smoker followed for chronic cough. + PPD, glaucoma.        Here with husband Sputum for AFB 04/20/11- smear NEG, Cx pending Again going to [place] to close up the state of her son who died. Cough disturbs sleep. Aggravates insomnia which has been a problem from childhood. We discussed sleep hygiene. Cough is either dry or productive of thick white to yellow sputum. Coughs more when stressed. We discussed possible role of reflux and she will watch for that. Denies fever, sweat, adenopathy, chest pain.  Review of Systems from 12/20- not reviewed 04/17/11 Constitutional:   No-   weight loss, night sweats, fevers, chills, fatigue, lassitude. HEENT:   No-  headaches, difficulty swallowing, tooth/dental problems, sore throat,       No-  sneezing, itching, ear ache, nasal congestion, post nasal drip,  CV:  No-   chest pain, orthopnea, PND, swelling in lower extremities, anasarca, dizziness, palpitations Resp: No-   shortness of breath with exertion or at rest.              +   productive cough,  + non-productive cough,  No- coughing up of blood.              No-   change in color of mucus.  No- wheezing.   Skin: No-   rash or lesions. GI:  No-   heartburn,  indigestion, abdominal pain, nausea, vomiting,                 , loss of appetite GU:  MS:  No-   joint pain or swelling.  Marland Kitchen  No- back pain. Neuro-     nothing unusual Psych:  No- change in mood or affect. No depression or anxiety.  No memory loss.      Objective:  From 02/12/11- not examined 04/17/11 General- Alert, Oriented, Affect-appropriate, Distress- none acute, over weight, steady talker Skin- rash-none, lesions- none, excoriation- none Lymphadenopathy- none Head- atraumatic            Eyes- Gross vision intact, PERRLA, conjunctivae clear secretions            Ears- Hearing, canals-normal            Nose- Clear, no-Septal dev, mucus, polyps,  erosion, perforation             Throat- Mallampati III , mucosa clear , drainage- none, tonsils- atrophic Neck- flexible , trachea midline, no stridor , thyroid nl, carotid no bruit Chest - symmetrical excursion , unlabored           Heart/CV- RRR , no murmur , no gallop  , no rub, nl s1 s2                           - JVD- none , edema- none, stasis changes- none, varices- none           Lung- clear to P&A, wheeze- none, cough- none, dullness-none, rub- none           Chest wall-  Abd- tender-no, distended-no, bowel sounds-present, HSM- no Br/ Gen/ Rectal- Not done, not indicated Extrem- cyanosis- none, clubbing, none, atrophy- none, strength- nl Neuro- grossly intact to observation      Addended by: Reynaldo Minium C on: 02/16/2011 10:17 AM Came  Modules accepted: Orders

## 2011-04-26 DIAGNOSIS — F5104 Psychophysiologic insomnia: Secondary | ICD-10-CM | POA: Insufficient documentation

## 2011-04-26 NOTE — Assessment & Plan Note (Signed)
Smear negative from sputum specimen. Awaiting culture. Chest x-ray does not look like granulomatous infection.

## 2011-04-26 NOTE — Assessment & Plan Note (Signed)
Reflux precautions. Refill cough syrup.

## 2011-05-05 ENCOUNTER — Telehealth: Payer: Self-pay

## 2011-05-05 NOTE — Telephone Encounter (Signed)
Nevin with [facility], called regarding a drug interaction with patient's Simvastatin 40 mg and Amlodipine 2.5 mg.  The FDA recommnedations are that the patient's Simvastating should be no more than 20 mg daily with the combination of the Amlodipine. Please call pharmacy to discuss.

## 2011-05-05 NOTE — Telephone Encounter (Signed)
We need to change simvistatin---lipitor 10 mg 1 po qhs

## 2011-05-06 ENCOUNTER — Telehealth: Payer: Self-pay | Admitting: *Deleted

## 2011-05-06 NOTE — Telephone Encounter (Signed)
Dr. Lucianne Muss advised the patient that she should not take the Lipitor. She wanted me to call Dr. Remus Blake office to find our why she she could not take the Lipitor.     KP

## 2011-05-06 NOTE — Telephone Encounter (Signed)
See letter from ins co--- I changed to lipitor

## 2011-05-06 NOTE — Telephone Encounter (Signed)
See other telephone encounter.

## 2011-05-07 NOTE — Telephone Encounter (Signed)
msg left for Dr.Kumar's CMA to return my call.    KP

## 2011-05-07 NOTE — Telephone Encounter (Signed)
Discussed with Mary Flynn and she stated Dr.Kumar did not take the patient off of Lipitor and it is ok for her to start it.   KP

## 2011-05-13 ENCOUNTER — Telehealth: Payer: Self-pay | Admitting: Family Medicine

## 2011-05-13 NOTE — Telephone Encounter (Signed)
Discussed with patient and she stated that she spoke with April and she told the patient that she should not take the Lipitor. I advised from our previous conversation and Discussed with April and she stated Dr.Kumar did not take the patient off of Lipitor and it is ok for her to start it. I advised I will call again in the morning and she voiced understanding.

## 2011-05-13 NOTE — Telephone Encounter (Signed)
Patient called & stated he was not suppose to be taking lipitor, but we sent in a prescription for it anyway. Please call when you can [phone]

## 2011-05-14 NOTE — Telephone Encounter (Signed)
Ok to give samples

## 2011-05-14 NOTE — Telephone Encounter (Signed)
I discussed with patient and she voiced understanding and stated she would take the Lipitor. She is currently waiting for it to be delivered from [facility]. She requested samples of Levemir. Please advise    KP

## 2011-05-14 NOTE — Telephone Encounter (Signed)
I spoke with Mary Flynn and she stated she look through the chart again and there was no documentation stating the patient should not be on Lipitor, she stated the patient has always been on simvastatin, and if there is a need to change, nothing is documented that she could not take the Lipitor.  I tried calling the patient and left msg to call.    KP

## 2011-05-20 ENCOUNTER — Telehealth: Payer: Self-pay

## 2011-05-20 NOTE — Telephone Encounter (Signed)
Call from patient and she stated she received the vest and it was 1000.00 and she sent it back, her insurance did not cover it. She stated she went to a massage therapist  And was told she would need a letter of medical necessity before she could have one done, she wanted Dr.Lowne to call the insurance company and send the letter. I called and was advised that patient will need to have a letter of  Medical necessity because the massage is not cover by insurance, they would need office notes and information supporting the claim and this would need to be mailed to the insurance company before the services are approved. i tried calling the patient back to make her aware.    VM left to call the office      KP

## 2011-05-21 ENCOUNTER — Encounter: Payer: Self-pay | Admitting: Family Medicine

## 2011-06-01 NOTE — Telephone Encounter (Signed)
Discussed with patient and she voiced understanding. She will get the paperwork from Dr.Ramos and from PT and a letter from the massage office and she will schedule an apt with Dr.Lowne once she has her information .      KP

## 2011-06-02 ENCOUNTER — Telehealth: Payer: Self-pay | Admitting: Family Medicine

## 2011-06-02 ENCOUNTER — Ambulatory Visit: Payer: Self-pay | Admitting: Ophthalmology

## 2011-06-02 LAB — CBC
HCT: 38.5 % (ref 35.0–47.0)
MCH: 28.4 pg (ref 26.0–34.0)
MCHC: 32.7 g/dL (ref 32.0–36.0)
MCV: 87 fL (ref 80–100)
Platelet: 208 10*3/uL (ref 150–440)
RBC: 4.43 10*6/uL (ref 3.80–5.20)
WBC: 5.4 10*3/uL (ref 3.6–11.0)

## 2011-06-02 LAB — BASIC METABOLIC PANEL
Anion Gap: 8 (ref 7–16)
BUN: 13 mg/dL (ref 7–18)
Calcium, Total: 9.2 mg/dL (ref 8.5–10.1)
Co2: 28 mmol/L (ref 21–32)
EGFR (African American): >60
Osmolality: 271 (ref 275–301)
Sodium: 136 mmol/L (ref 136–145)

## 2011-06-02 MED ORDER — GABAPENTIN 100 MG PO CAPS: 100.0000 mg | ORAL_CAPSULE | Freq: Two times a day (BID) | ORAL | Status: DC

## 2011-06-02 MED ORDER — OMEPRAZOLE 20 MG PO CPDR: 20.0000 mg | DELAYED_RELEASE_CAPSULE | Freq: Every day | ORAL | Status: DC

## 2011-06-02 MED ORDER — LEVOCETIRIZINE DIHYDROCHLORIDE 5 MG PO TABS: 5.0000 mg | ORAL_TABLET | Freq: Every evening | ORAL | Status: DC

## 2011-06-02 NOTE — Telephone Encounter (Signed)
Patient states that she would like refills of gabapentin, omeprazole, and levocetirizine to be sent to her new pharmacy, [facility]. 90 days supply for each medication

## 2011-06-02 NOTE — Telephone Encounter (Signed)
Rx faxed.    KP 

## 2011-06-06 ENCOUNTER — Emergency Department (HOSPITAL_COMMUNITY): Payer: Medicare Other

## 2011-06-06 ENCOUNTER — Encounter (HOSPITAL_COMMUNITY): Payer: Self-pay | Admitting: *Deleted

## 2011-06-06 ENCOUNTER — Emergency Department (HOSPITAL_COMMUNITY)
Admission: EM | Admit: 2011-06-06 | Discharge: 2011-06-07 | Disposition: A | Discharge status: Other Acute Inpatient Hospital | Payer: Medicare Other | Source: Home / Self Care | Attending: Emergency Medicine | Admitting: Emergency Medicine

## 2011-06-06 DIAGNOSIS — W19XXXA Unspecified fall, initial encounter: Secondary | ICD-10-CM

## 2011-06-06 DIAGNOSIS — Y9229 Other specified public building as the place of occurrence of the external cause: Secondary | ICD-10-CM | POA: Insufficient documentation

## 2011-06-06 DIAGNOSIS — S32009A Unspecified fracture of unspecified lumbar vertebra, initial encounter for closed fracture: Secondary | ICD-10-CM | POA: Insufficient documentation

## 2011-06-06 DIAGNOSIS — M545 Low back pain, unspecified: Secondary | ICD-10-CM | POA: Insufficient documentation

## 2011-06-06 DIAGNOSIS — R05 Cough: Secondary | ICD-10-CM | POA: Insufficient documentation

## 2011-06-06 DIAGNOSIS — I10 Essential (primary) hypertension: Secondary | ICD-10-CM | POA: Insufficient documentation

## 2011-06-06 DIAGNOSIS — Z79899 Other long term (current) drug therapy: Secondary | ICD-10-CM | POA: Insufficient documentation

## 2011-06-06 DIAGNOSIS — R059 Cough, unspecified: Secondary | ICD-10-CM | POA: Insufficient documentation

## 2011-06-06 DIAGNOSIS — M810 Age-related osteoporosis without current pathological fracture: Secondary | ICD-10-CM | POA: Insufficient documentation

## 2011-06-06 DIAGNOSIS — E119 Type 2 diabetes mellitus without complications: Secondary | ICD-10-CM | POA: Insufficient documentation

## 2011-06-06 DIAGNOSIS — M25559 Pain in unspecified hip: Secondary | ICD-10-CM | POA: Insufficient documentation

## 2011-06-06 DIAGNOSIS — R109 Unspecified abdominal pain: Secondary | ICD-10-CM | POA: Insufficient documentation

## 2011-06-06 DIAGNOSIS — E785 Hyperlipidemia, unspecified: Secondary | ICD-10-CM | POA: Insufficient documentation

## 2011-06-06 DIAGNOSIS — S3992XA Unspecified injury of lower back, initial encounter: Secondary | ICD-10-CM

## 2011-06-06 LAB — GLUCOSE, CAPILLARY: Glucose-Capillary: 173 mg/dL — ABNORMAL HIGH (ref 70–99)

## 2011-06-06 MED ORDER — HYDROCODONE-ACETAMINOPHEN 5-325 MG PO TABS
1.0000 | ORAL_TABLET | Freq: Once | ORAL | Status: AC
  Administered 2011-06-06: 1 via ORAL
  Filled 2011-06-06: qty 1

## 2011-06-06 MED ORDER — ONDANSETRON 4 MG PO TBDP
8.0000 mg | ORAL_TABLET | Freq: Once | ORAL | Status: AC
  Administered 2011-06-06: 8 mg via ORAL
  Filled 2011-06-06: qty 1

## 2011-06-06 NOTE — ED Provider Notes (Signed)
History     CSN: [id number]  Arrival date & time 06/06/11  2216   First MD Initiated Contact with Patient 06/06/11 2227      Chief Complaint  Patient presents with  . Back Pain  . Hip Pain    (Consider location/radiation/quality/duration/timing/severity/associated sxs/prior treatment) HPI Comments: Mary Flynn is a 77 y.o. Female who was at a temple tonight when she felt nauseated, so carried her plate to a bench. While walking with the plate; she stepped on an uneven area of the floor, and this caused her to fall. She injured her low back in the fall. She has had some mild nausea and decreased appetite for one day. She has had a cough, nonproductive, for several days. She's had no fever or chills Headache or trouble walking. She's been using her usual medications.  Patient is a 77 y.o. female presenting with back pain and hip pain. The history is provided by the patient and the spouse.  Back Pain   Hip Pain    Past Medical History  Diagnosis Date  . Diabetes mellitus   . Hyperlipidemia   . Hypertension     Past Surgical History  Procedure Date  . Appendectomy   . Surgical sterilization 40 years ago  . Refractive surgery     Left Eye    Family History  Problem Relation Age of Onset  . Depression    . Cancer      laryngeal  . Diabetes      History  Substance Use Topics  . Smoking status: Never Smoker   . Smokeless tobacco: Never Used  . Alcohol Use: No    OB History    Grav Para Term Preterm Abortions TAB SAB Ect Mult Living                  Review of Systems  Musculoskeletal: Positive for back pain.  All other systems reviewed and are negative.    Allergies  Penicillins and Sulfonamide derivatives  Home Medications   Current Outpatient Rx  Name Route Sig Dispense Refill  . ALENDRONATE SODIUM 70 MG PO TABS Oral Take 1 tablet (70 mg total) by mouth every 7 (seven) days. Take with a full glass of water on an empty stomach. 12 tablet 3    ID#  [id number]  . AMLODIPINE BESYLATE 2.5 MG PO TABS Oral Take 1 tablet (2.5 mg total) by mouth daily. 90 tablet 3    ID# [id number]  . ASPIRIN EC 81 MG PO TBEC Oral Take 81 mg by mouth daily.    . ATORVASTATIN CALCIUM 10 MG PO TABS Oral Take 10 mg by mouth daily.    . B COMPLEX VITAMINS PO CAPS Oral Take 1 capsule by mouth daily.    Marland Kitchen DIPHENHYDRAMINE HCL (SLEEP) 25 MG PO TABS Oral Take 25 mg by mouth at bedtime as needed. For allergies    . FLUOXETINE HCL 10 MG PO CAPS  TAKE 1 CAPSULE DAILY 90 capsule 1  . FLUOXETINE HCL 10 MG PO CAPS Oral Take 10 mg by mouth daily.    Marland Kitchen GABAPENTIN 100 MG PO CAPS Oral Take 1 capsule (100 mg total) by mouth 2 (two) times daily. 180 capsule 1  . GLIMEPIRIDE 2 MG PO TABS Oral Take 1 tablet by mouth daily.    Marland Kitchen LEVEMIR FLEXPEN [place] Subcutaneous Inject 18 Units into the skin at bedtime.      Marland Kitchen LEVOCETIRIZINE DIHYDROCHLORIDE 5 MG PO TABS Oral Take 1 tablet (5 mg  total) by mouth every evening. 90 tablet 1  . LOSARTAN POTASSIUM 100 MG PO TABS Oral Take 1 tablet (100 mg total) by mouth daily. 90 tablet 1    ID# [id number]  . METFORMIN HCL 500 MG PO TABS Oral Take 500 mg by mouth 2 (two) times daily with a meal.      . ADULT MULTIVITAMIN W/MINERALS CH Oral Take 1 tablet by mouth daily.    Marland Kitchen NIACIN ER (ANTIHYPERLIPIDEMIC) 1000 MG PO TBCR Oral Take 1,000 mg by mouth at bedtime.      . OMEGA-3-ACID ETHYL ESTERS 1 G PO CAPS Oral Take 2 capsules (2 g total) by mouth 2 (two) times daily. 360 capsule 0  . OMEPRAZOLE 20 MG PO CPDR Oral Take 1 capsule (20 mg total) by mouth daily. 90 capsule 1  . SITAGLIPTIN-METFORMIN HCL 50-500 MG PO TABS Oral Take 1 tablet by mouth 2 (two) times daily with a meal.      . VALSARTAN 320 MG PO TABS Oral Take 320 mg by mouth daily.      Marland Kitchen VITAMIN E PO Oral Take 1 tablet by mouth daily.    Marland Kitchen ZOLPIDEM TARTRATE 5 MG PO TABS  5 mg. 1 or 2  At bedtime if needed    . ZOLPIDEM TARTRATE 10 MG PO TABS Oral Take 1 tablet (10 mg total) by mouth at bedtime as needed  for sleep. 30 tablet 0  . ZOLPIDEM TARTRATE 10 MG PO TABS Oral Take 1 tablet (10 mg total) by mouth at bedtime as needed for sleep. 30 tablet 0    BP 176/76  Pulse 91  Temp(Src) 97.8 F (36.6 C) (Oral)  Resp 20  SpO2 94%  Physical Exam  Nursing note and vitals reviewed. Constitutional: She is oriented to person, place, and time. She appears well-developed and well-nourished.  HENT:  Head: Normocephalic and atraumatic.  Eyes: Conjunctivae and EOM are normal. Pupils are equal, round, and reactive to light.  Neck: Normal range of motion and phonation normal. Neck supple.  Cardiovascular: Normal rate, regular rhythm and intact distal pulses.   Pulmonary/Chest: Effort normal and breath sounds normal. No respiratory distress. She has no rales. She exhibits no tenderness.  Abdominal: Soft. She exhibits no distension. There is no tenderness. There is no guarding.  Musculoskeletal: Normal range of motion.       Mild lumbar tenderness without step-off, swelling, or deformity  Neurological: She is alert and oriented to person, place, and time. She has normal strength. She exhibits normal muscle tone.  Skin: Skin is warm and dry.  Psychiatric: She has a normal mood and affect. Her behavior is normal. Judgment and thought content normal.    ED Course  Procedures (including critical care time)  Labs Reviewed  GLUCOSE, CAPILLARY - Abnormal; Notable for the following:    Glucose-Capillary 173 (*)    All other components within normal limits  URINALYSIS, ROUTINE W REFLEX MICROSCOPIC   No results found.   1. Fall   2. Back injuries   3. Cough       MDM  Apparent mechanical fall with low back injury. Unssociated cough for several days.      Care to Dr. Hyacinth Meeker to evaluate after imaging returns  Flint Melter, MD 06/11/11 [phone]

## 2011-06-06 NOTE — ED Notes (Signed)
PT reports pain unrelieved; MD made aware.

## 2011-06-06 NOTE — ED Notes (Signed)
Pt arrived via [facility] c/o lower back pain and bilateral hip after fall onto sidewalk. Denies LOC. Hx Diabetes. EMS VS BP 200/102. Allegies to sulfa and [facility]

## 2011-06-06 NOTE — ED Notes (Signed)
Patient transported to X-ray 

## 2011-06-06 NOTE — ED Notes (Addendum)
PT returned from xray. PT urinated while in xray; unable to urinate at this time.

## 2011-06-07 ENCOUNTER — Emergency Department (INDEPENDENT_AMBULATORY_CARE_PROVIDER_SITE_OTHER): Payer: Medicare Other

## 2011-06-07 ENCOUNTER — Encounter (HOSPITAL_BASED_OUTPATIENT_CLINIC_OR_DEPARTMENT_OTHER): Payer: Self-pay | Admitting: *Deleted

## 2011-06-07 ENCOUNTER — Inpatient Hospital Stay (HOSPITAL_BASED_OUTPATIENT_CLINIC_OR_DEPARTMENT_OTHER)
Admission: EM | Admit: 2011-06-07 | Discharge: 2011-06-18 | DRG: 515 | Disposition: A | Discharge status: Skilled Nursing Facility | Payer: Medicare Other | Attending: Internal Medicine | Admitting: Internal Medicine

## 2011-06-07 ENCOUNTER — Emergency Department (HOSPITAL_COMMUNITY): Payer: Medicare Other

## 2011-06-07 DIAGNOSIS — Z79899 Other long term (current) drug therapy: Secondary | ICD-10-CM

## 2011-06-07 DIAGNOSIS — J189 Pneumonia, unspecified organism: Secondary | ICD-10-CM | POA: Diagnosis not present

## 2011-06-07 DIAGNOSIS — M810 Age-related osteoporosis without current pathological fracture: Secondary | ICD-10-CM | POA: Diagnosis present

## 2011-06-07 DIAGNOSIS — J9600 Acute respiratory failure, unspecified whether with hypoxia or hypercapnia: Secondary | ICD-10-CM | POA: Diagnosis not present

## 2011-06-07 DIAGNOSIS — Z7982 Long term (current) use of aspirin: Secondary | ICD-10-CM

## 2011-06-07 DIAGNOSIS — W19XXXA Unspecified fall, initial encounter: Secondary | ICD-10-CM

## 2011-06-07 DIAGNOSIS — Z882 Allergy status to sulfonamides status: Secondary | ICD-10-CM

## 2011-06-07 DIAGNOSIS — E669 Obesity, unspecified: Secondary | ICD-10-CM | POA: Diagnosis present

## 2011-06-07 DIAGNOSIS — E1142 Type 2 diabetes mellitus with diabetic polyneuropathy: Secondary | ICD-10-CM | POA: Diagnosis present

## 2011-06-07 DIAGNOSIS — S32009A Unspecified fracture of unspecified lumbar vertebra, initial encounter for closed fracture: Secondary | ICD-10-CM

## 2011-06-07 DIAGNOSIS — E1149 Type 2 diabetes mellitus with other diabetic neurological complication: Secondary | ICD-10-CM | POA: Diagnosis present

## 2011-06-07 DIAGNOSIS — Y9229 Other specified public building as the place of occurrence of the external cause: Secondary | ICD-10-CM

## 2011-06-07 DIAGNOSIS — G8911 Acute pain due to trauma: Secondary | ICD-10-CM | POA: Diagnosis present

## 2011-06-07 DIAGNOSIS — M545 Low back pain, unspecified: Secondary | ICD-10-CM | POA: Diagnosis present

## 2011-06-07 DIAGNOSIS — E1151 Type 2 diabetes mellitus with diabetic peripheral angiopathy without gangrene: Secondary | ICD-10-CM | POA: Diagnosis present

## 2011-06-07 DIAGNOSIS — K219 Gastro-esophageal reflux disease without esophagitis: Secondary | ICD-10-CM | POA: Diagnosis present

## 2011-06-07 DIAGNOSIS — Z794 Long term (current) use of insulin: Secondary | ICD-10-CM

## 2011-06-07 DIAGNOSIS — S33101A Dislocation of unspecified lumbar vertebra, initial encounter: Secondary | ICD-10-CM

## 2011-06-07 DIAGNOSIS — Z880 Allergy status to penicillin: Secondary | ICD-10-CM

## 2011-06-07 DIAGNOSIS — S32020A Wedge compression fracture of second lumbar vertebra, initial encounter for closed fracture: Secondary | ICD-10-CM | POA: Diagnosis present

## 2011-06-07 DIAGNOSIS — K5900 Constipation, unspecified: Secondary | ICD-10-CM | POA: Diagnosis present

## 2011-06-07 DIAGNOSIS — W010XXA Fall on same level from slipping, tripping and stumbling without subsequent striking against object, initial encounter: Secondary | ICD-10-CM | POA: Diagnosis present

## 2011-06-07 DIAGNOSIS — M5137 Other intervertebral disc degeneration, lumbosacral region: Secondary | ICD-10-CM

## 2011-06-07 DIAGNOSIS — Z833 Family history of diabetes mellitus: Secondary | ICD-10-CM

## 2011-06-07 DIAGNOSIS — E785 Hyperlipidemia, unspecified: Secondary | ICD-10-CM | POA: Diagnosis present

## 2011-06-07 DIAGNOSIS — I10 Essential (primary) hypertension: Secondary | ICD-10-CM | POA: Diagnosis present

## 2011-06-07 DIAGNOSIS — IMO0002 Reserved for concepts with insufficient information to code with codable children: Secondary | ICD-10-CM | POA: Diagnosis present

## 2011-06-07 LAB — COMPREHENSIVE METABOLIC PANEL
ALT: 21 U/L (ref 0–35)
AST: 23 U/L (ref 0–37)
Albumin: 4.2 g/dL (ref 3.5–5.2)
CO2: 26 mEq/L (ref 19–32)
Chloride: 95 mEq/L — ABNORMAL LOW (ref 96–112)
Creatinine, Ser: 0.5 mg/dL (ref 0.50–1.10)
Potassium: 3.7 mEq/L (ref 3.5–5.1)
Sodium: 134 mEq/L — ABNORMAL LOW (ref 135–145)
Total Bilirubin: 1.3 mg/dL — ABNORMAL HIGH (ref 0.3–1.2)

## 2011-06-07 LAB — DIFFERENTIAL
Basophils Absolute: 0 10*3/uL (ref 0.0–0.1)
Basophils Relative: 0 % (ref 0–1)
Lymphocytes Relative: 15 % (ref 12–46)
Monocytes Absolute: 1 10*3/uL (ref 0.1–1.0)
Neutro Abs: 6.8 10*3/uL (ref 1.7–7.7)
Neutrophils Relative %: 73 % (ref 43–77)

## 2011-06-07 LAB — GLUCOSE, CAPILLARY: Glucose-Capillary: 192 mg/dL — ABNORMAL HIGH (ref 70–99)

## 2011-06-07 LAB — CBC
MCHC: 35.3 g/dL (ref 30.0–36.0)
Platelets: 200 10*3/uL (ref 150–400)
RDW: 13.9 % (ref 11.5–15.5)
WBC: 9.3 10*3/uL (ref 4.0–10.5)

## 2011-06-07 LAB — URINALYSIS, ROUTINE W REFLEX MICROSCOPIC
Bilirubin Urine: NEGATIVE
Hgb urine dipstick: NEGATIVE
Protein, ur: NEGATIVE mg/dL
Urobilinogen, UA: 0.2 mg/dL (ref 0.0–1.0)

## 2011-06-07 MED ORDER — NAPROXEN 500 MG PO TABS: 500.0000 mg | ORAL_TABLET | Freq: Two times a day (BID) | ORAL | Status: DC

## 2011-06-07 MED ORDER — HYDROMORPHONE HCL PF 1 MG/ML IJ SOLN
1.0000 mg | Freq: Once | INTRAMUSCULAR | Status: AC
  Administered 2011-06-07: 1 mg via INTRAVENOUS
  Filled 2011-06-07: qty 1

## 2011-06-07 MED ORDER — SODIUM CHLORIDE 0.9 % IV BOLUS (SEPSIS)
500.0000 mL | Freq: Once | INTRAVENOUS | Status: AC
  Administered 2011-06-07: 500 mL via INTRAVENOUS

## 2011-06-07 MED ORDER — MORPHINE SULFATE 4 MG/ML IJ SOLN
4.0000 mg | Freq: Once | INTRAMUSCULAR | Status: AC
  Administered 2011-06-07: 4 mg via INTRAVENOUS
  Filled 2011-06-07: qty 1

## 2011-06-07 MED ORDER — OXYCODONE-ACETAMINOPHEN 5-325 MG PO TABS
2.0000 | ORAL_TABLET | Freq: Once | ORAL | Status: AC
  Administered 2011-06-07: 2 via ORAL
  Filled 2011-06-07: qty 2

## 2011-06-07 MED ORDER — OXYCODONE-ACETAMINOPHEN 5-325 MG PO TABS
1.0000 | ORAL_TABLET | ORAL | Status: AC | PRN
Start: 2011-06-07 — End: 2011-06-17

## 2011-06-07 MED ORDER — SODIUM CHLORIDE 0.9 % IV SOLN
INTRAVENOUS | Status: AC
  Administered 2011-06-07: via INTRAVENOUS

## 2011-06-07 NOTE — ED Notes (Signed)
EMS transport from home- [facility] for [facility] and reports pain meds aren't working- family reports she has fracture

## 2011-06-07 NOTE — ED Provider Notes (Signed)
History   Scribed for Forbes Cellar, MD, the patient was seen in room MH12/MH12 . This chart was scribed by Lewanda Rife.   CSN: [id number]  Arrival date & time 06/07/11  1537   First MD Initiated Contact with Patient 06/07/11 1545      Chief Complaint  Patient presents with  . Back Pain  . Abdominal Pain    (Consider location/radiation/quality/duration/timing/severity/associated sxs/prior treatment) HPI Mary Flynn is a 77 y.o. female who presents to the Emergency Department complaining of lower back pain since yesterday evening. Pt was seen at [facility] ED last night for the same. Pt states she fell backwards last night on cement floor during temple. Pt states she was walking with a plate in hand and stepped on an uneven surface when she fell and hurt lower back. Pt describes the pain as worsening and severe. Pt is additionally complaining of bilateral hip pain and states she has not walked since before the fall because of pain. Pt complains of mild nonproductive cough for several days, and diffuse abdominal pain. Pt denies hitting head, LOC, fever, headache, chest pain, shortness of breath, and dysuria. Pt states pain is relieved at rest and worsened with movement. Pt states Rx prescribed last night (Norco 5-325) is not helping.  Past Medical History  Diagnosis Date  . Diabetes mellitus   . Hyperlipidemia   . Hypertension     Past Surgical History  Procedure Date  . Appendectomy   . Surgical sterilization 40 years ago  . Refractive surgery     Left Eye    Family History  Problem Relation Age of Onset  . Depression    . Cancer      laryngeal  . Diabetes      History  Substance Use Topics  . Smoking status: Never Smoker   . Smokeless tobacco: Never Used  . Alcohol Use: No    OB History    Grav Para Term Preterm Abortions TAB SAB Ect Mult Living                  Review of Systems  Constitutional: Negative.  Negative for fever.  HENT: Negative.  Negative  for neck pain.   Respiratory: Negative.   Cardiovascular: Negative.   Gastrointestinal: Positive for abdominal pain. Negative for vomiting and diarrhea.  Musculoskeletal: Positive for back pain.  Skin: Negative.   Neurological: Negative.   Hematological: Negative.   Psychiatric/Behavioral: Negative.   All other systems reviewed and are negative.    Allergies  Penicillins and Sulfonamide derivatives  Home Medications   Current Outpatient Rx  Name Route Sig Dispense Refill  . ALENDRONATE SODIUM 70 MG PO TABS Oral Take 1 tablet (70 mg total) by mouth every 7 (seven) days. Take with a full glass of water on an empty stomach. 12 tablet 3    ID# [id number]  . AMLODIPINE BESYLATE 2.5 MG PO TABS Oral Take 1 tablet (2.5 mg total) by mouth daily. 90 tablet 3    ID# [id number]  . ASPIRIN EC 81 MG PO TBEC Oral Take 81 mg by mouth daily.    . ATORVASTATIN CALCIUM 10 MG PO TABS Oral Take 10 mg by mouth daily.    Marland Kitchen ZYRTEC PO Oral Take 1 tablet by mouth daily as needed. Patient took this medication for her allergies.    Marland Kitchen GABAPENTIN 100 MG PO CAPS Oral Take 1 capsule (100 mg total) by mouth 2 (two) times daily. 180 capsule 1  . LEVOCETIRIZINE DIHYDROCHLORIDE  5 MG PO TABS Oral Take 1 tablet (5 mg total) by mouth every evening. 90 tablet 1  . METFORMIN HCL 500 MG PO TABS Oral Take 500 mg by mouth 2 (two) times daily with a meal.      . OMEGA-3-ACID ETHYL ESTERS 1 G PO CAPS Oral Take 2 capsules (2 g total) by mouth 2 (two) times daily. 360 capsule 0  . OMEPRAZOLE 20 MG PO CPDR Oral Take 1 capsule (20 mg total) by mouth daily. 90 capsule 1  . OXYCODONE-ACETAMINOPHEN 5-325 MG PO TABS Oral Take 1 tablet by mouth every 4 (four) hours as needed for pain. May take 2 tablets PO q 6 hours for severe pain - Do not take with Tylenol as this tablet already contains tylenol 15 tablet 0  . SIMVASTATIN 40 MG PO TABS Oral Take 40 mg by mouth every evening.    Marland Kitchen SITAGLIPTIN-METFORMIN HCL 50-500 MG PO TABS Oral Take 1  tablet by mouth 2 (two) times daily with a meal.      . VALSARTAN 320 MG PO TABS Oral Take 320 mg by mouth daily.      Marland Kitchen DIPHENHYDRAMINE HCL (SLEEP) 25 MG PO TABS Oral Take 25 mg by mouth at bedtime as needed. For allergies    . FLUOXETINE HCL 10 MG PO CAPS  TAKE 1 CAPSULE DAILY 90 capsule 1  . FLUOXETINE HCL 10 MG PO CAPS Oral Take 10 mg by mouth daily.    Marland Kitchen GLIMEPIRIDE 2 MG PO TABS Oral Take 1 tablet by mouth daily.    Marland Kitchen LEVEMIR FLEXPEN [place] Subcutaneous Inject 18 Units into the skin at bedtime.      Marland Kitchen LOSARTAN POTASSIUM 100 MG PO TABS Oral Take 1 tablet (100 mg total) by mouth daily. 90 tablet 1    ID# [id number]  . ADULT MULTIVITAMIN W/MINERALS CH Oral Take 1 tablet by mouth daily.    Marland Kitchen NIACIN ER (ANTIHYPERLIPIDEMIC) 1000 MG PO TBCR Oral Take 1,000 mg by mouth at bedtime.      Marland Kitchen VITAMIN E PO Oral Take 1 tablet by mouth daily.    Marland Kitchen ZOLPIDEM TARTRATE 10 MG PO TABS Oral Take 1 tablet (10 mg total) by mouth at bedtime as needed for sleep. 30 tablet 0  . ZOLPIDEM TARTRATE 10 MG PO TABS Oral Take 1 tablet (10 mg total) by mouth at bedtime as needed for sleep. 30 tablet 0  . ZOLPIDEM TARTRATE 5 MG PO TABS  5 mg. 1 or 2  At bedtime if needed     06/07/11 0217 -- 94 18 154/60 96  Physical Exam  Nursing note and vitals reviewed. Constitutional: She is oriented to person, place, and time. She appears well-developed and well-nourished.  HENT:  Head: Normocephalic and atraumatic.       Dry oropharynx   Eyes: Conjunctivae and EOM are normal. Pupils are equal, round, and reactive to light.  Neck: Normal range of motion.  Cardiovascular: Normal rate and regular rhythm.   Pulmonary/Chest: Effort normal. She has no wheezes. She has no rales.  Abdominal: Soft. Bowel sounds are normal. She exhibits no mass. There is tenderness (diffuse tenderness on palpation ). There is no rebound and no guarding.  Musculoskeletal: Normal range of motion. She exhibits no edema.       Cervical back: Normal.        Thoracic back: Normal.       Lumbar back: She exhibits tenderness (mild lumbar tenderness).  Neurological: She is alert and oriented  to person, place, and time. She has normal strength. No cranial nerve deficit or sensory deficit.       5/5 motor  Dp/pt are intact  No paresthesias    Skin: Skin is warm and dry.  Psychiatric: She has a normal mood and affect.    ED Course  Procedures (including critical care time)  Labs Reviewed  COMPREHENSIVE METABOLIC PANEL - Abnormal; Notable for the following:    Sodium 134 (*)    Chloride 95 (*)    Glucose, Bld 193 (*)    Total Bilirubin 1.3 (*)    All other components within normal limits  CBC  DIFFERENTIAL  LIPASE, BLOOD   Dg Chest 2 View  06/07/2011  *RADIOLOGY REPORT*  Clinical Data: Cough.  Hypertension.  Diabetes.  CHEST - 2 VIEW  Comparison: 02/12/2011  Findings: Bilateral linear opacities are stable consistent with scarring.  No evidence of acute infiltrate or edema.  No evidence of pleural effusion.  Heart size and mediastinal contours are stable.  IMPRESSION: Stable bilateral scarring.  No acute findings.  Original Report Authenticated By: Danae Orleans, M.D.   Dg Lumbar Spine Complete  06/07/2011  *RADIOLOGY REPORT*  Clinical Data: Fall.  Low back injury and pain.  LUMBAR SPINE - COMPLETE 4+ VIEW  Comparison: 12/05/2004  Findings: Generalized osteopenia is noted.  An old mild compression deformity of the superior endplate of T12 vertebral body is stable.  A mild superior endplate compression of the L2 vertebral body is also seen, which is new since 2006 exam, but is of indeterminate age radiographically.  No other lumbar spine fracture identified.  No evidence of subluxation.  Intervertebral disc spaces are maintained.  IMPRESSION:  1.  Mild L2 superior endplate compression fracture deformity.  This is of indeterminate age radiographically, but is new since 2006 exam. 2.  Stable old superior endplate compression of T12 vertebral body. 3.   Osteopenia.  Original Report Authenticated By: Danae Orleans, M.D.   Ct Lumbar Spine Wo Contrast  06/07/2011  *RADIOLOGY REPORT*  Clinical Data: Fall.  Compression fracture.  Pain.  CT LUMBAR SPINE WITHOUT CONTRAST  Technique:  Multidetector CT imaging of the lumbar spine was performed without intravenous contrast administration. Multiplanar CT image reconstructions were also generated.  Comparison: Lumbar spine radiographs 06/06/2011.  Findings: The lumbar spine is imaged from midbody of T12 through S2.  Alignment is maintained.  Atherosclerotic calcifications are present in the aorta and branch vessels without aneurysm.  Limited imaging of the abdomen is otherwise unremarkable.  The superior endplate fracture of T12 is incompletely imaged.  By radiograph, this is remote on the prior studies.  The superior endplate compression fracture at L2 appears acute. There is slight retropulsion of bone along the superior endplate, measuring 4 mm.  There is no significant stenosis.  L2-3:  A leftward disc herniation results in mild left foraminal stenosis.  L3-4:  A leftward disc herniation results in mild left foraminal stenosis.  L4-5:  Mild broad-based disc herniation is present.  Facet hypertrophy is noted bilaterally.  This results in mild central and bilateral foraminal narrowing.  L5-S1:  A shallow central disc bulges present without significant stenosis.  IMPRESSION:  1.  Superior endplate compression fracture at L2 with 25% loss of height relative to the adjacent segment. 2.  Slight retropulsion of bone along the superior endplate of L2 measures 4 mm without significant associated stenosis. 3.  Leftward disc herniations at L2-3 and L3-4 results in mild left foraminal narrowing.  4.  Broad-based disc herniation at L4-5 with mild central and bilateral foraminal narrowing.  Facet hypertrophy contributes. 5.  Shallow central disc protrusion at L5-S1 without significant stenosis. 6.  The superior endplate fracture at  T12 is incompletely imaged.  Original Report Authenticated By: Jamesetta Orleans. MATTERN, M.D.    1. Compression fracture     MDM  S/p mechanical fall with increased pain. Neurovascularly intact. Strength 5/5 b/l LE. Pain not controlled in ED s/p morphine and dilaudid. Will admit for pain control. D/W Ortho-- no surgical intervention. Discussed admission with [facility] hospitalist.  I personally performed the services described in this documentation, which was scribed in my presence. The recorded information has been reviewed and considered.     Forbes Cellar, MD 06/07/11 2128

## 2011-06-07 NOTE — ED Notes (Signed)
PT is able to move but still reporting some pain.

## 2011-06-07 NOTE — ED Notes (Signed)
PT provided suction to remove sputum. Provided with under garment per request. Repositioned. Family at bedside

## 2011-06-07 NOTE — ED Notes (Signed)
Patient transported to X-ray 

## 2011-06-07 NOTE — ED Notes (Signed)
Family at bedside is planning on going home for a while since pt is waiting on transport.  Family left contact phone number of [phone] and requested call when departure is more definitive

## 2011-06-07 NOTE — Discharge Instructions (Signed)
Your back pain should be treated with medicines such as ibuprofen or aleve and this back pain should get better over the next 2 weeks.  However if you develop severe or worsening pain, low back pain with fever, numbness, weakness or inability to walk or urinate, you should return to the ER immediately.  Please follow up with your doctor this week for a recheck if still having symptoms.  Your xray showed a possible new fracture which is very small in your lower back.  Please take naprosyn twice daily, percocet for severe pain - ask your doctor to pursue more work up on your lower back pain which may need an MRI.

## 2011-06-07 NOTE — ED Notes (Signed)
PT returned from xray

## 2011-06-07 NOTE — ED Notes (Signed)
PT ambulated into wheelchair; VSS; A&Ox3; no signs of distress; respirations even and unlabored; skin warm and dry.NO questions at this time.

## 2011-06-07 NOTE — ED Provider Notes (Signed)
  Physical Exam  BP 176/76  Pulse 91  Temp(Src) 97.8 F (36.6 C) (Oral)  Resp 20  SpO2 94%  Physical Exam  ED Course  Procedures  MDM  Pt reevaluated, improved with meds, encouraged to f/u with PMD  For outpt MRI, return if worsened.      Vida Roller, MD 06/07/11 [phone]

## 2011-06-08 ENCOUNTER — Encounter (HOSPITAL_COMMUNITY): Payer: Self-pay | Admitting: *Deleted

## 2011-06-08 ENCOUNTER — Telehealth: Payer: Self-pay | Admitting: *Deleted

## 2011-06-08 DIAGNOSIS — S32020A Wedge compression fracture of second lumbar vertebra, initial encounter for closed fracture: Secondary | ICD-10-CM | POA: Diagnosis present

## 2011-06-08 LAB — CBC
MCH: 28.4 pg (ref 26.0–34.0)
MCV: 82.5 fL (ref 78.0–100.0)
Platelets: 190 10*3/uL (ref 150–400)
Platelets: 209 10*3/uL (ref 150–400)
RBC: 4.51 MIL/uL (ref 3.87–5.11)
RBC: 4.62 MIL/uL (ref 3.87–5.11)
WBC: 8.1 10*3/uL (ref 4.0–10.5)
WBC: 8.8 10*3/uL (ref 4.0–10.5)

## 2011-06-08 LAB — BASIC METABOLIC PANEL
CO2: 25 mEq/L (ref 19–32)
Calcium: 9.1 mg/dL (ref 8.4–10.5)
Chloride: 98 mEq/L (ref 96–112)
Sodium: 135 mEq/L (ref 135–145)

## 2011-06-08 LAB — GLUCOSE, CAPILLARY
Glucose-Capillary: 206 mg/dL — ABNORMAL HIGH (ref 70–99)
Glucose-Capillary: 131 mg/dL — ABNORMAL HIGH (ref 70–99)
Glucose-Capillary: 232 mg/dL — ABNORMAL HIGH (ref 70–99)

## 2011-06-08 LAB — HEMOGLOBIN A1C
Hgb A1c MFr Bld: 6.8 % — ABNORMAL HIGH (ref <5.7)
Mean Plasma Glucose: 148 mg/dL — ABNORMAL HIGH (ref <117)

## 2011-06-08 MED ORDER — BISACODYL 10 MG RE SUPP
10.0000 mg | Freq: Every day | RECTAL | Status: DC | PRN
  Administered 2011-06-08 – 2011-06-15 (×2): 10 mg via RECTAL
  Filled 2011-06-08 (×3): qty 1

## 2011-06-08 MED ORDER — LINAGLIPTIN 5 MG PO TABS
5.0000 mg | ORAL_TABLET | Freq: Every day | ORAL | Status: DC
  Administered 2011-06-08 – 2011-06-18 (×10): 5 mg via ORAL
  Filled 2011-06-08 (×12): qty 1

## 2011-06-08 MED ORDER — POLYETHYLENE GLYCOL 3350 17 G PO PACK
17.0000 g | PACK | Freq: Two times a day (BID) | ORAL | Status: DC
  Administered 2011-06-08 – 2011-06-18 (×15): 17 g via ORAL
  Filled 2011-06-08 (×21): qty 1

## 2011-06-08 MED ORDER — SITAGLIPTIN PHOS-METFORMIN HCL 50-500 MG PO TABS: 1.0000 | ORAL_TABLET | Freq: Two times a day (BID) | ORAL | Status: DC

## 2011-06-08 MED ORDER — SENNA 8.6 MG PO TABS
1.0000 | ORAL_TABLET | Freq: Every day | ORAL | Status: DC
  Administered 2011-06-08 – 2011-06-18 (×10): 8.6 mg via ORAL
  Filled 2011-06-08 (×11): qty 1

## 2011-06-08 MED ORDER — FLUOXETINE HCL 10 MG PO CAPS
10.0000 mg | ORAL_CAPSULE | Freq: Every day | ORAL | Status: DC
  Filled 2011-06-08: qty 1

## 2011-06-08 MED ORDER — PANTOPRAZOLE SODIUM 40 MG PO TBEC
40.0000 mg | DELAYED_RELEASE_TABLET | Freq: Every day | ORAL | Status: DC
  Administered 2011-06-08 – 2011-06-18 (×11): 40 mg via ORAL
  Filled 2011-06-08 (×8): qty 1

## 2011-06-08 MED ORDER — ONDANSETRON HCL 4 MG/2ML IJ SOLN
4.0000 mg | Freq: Four times a day (QID) | INTRAMUSCULAR | Status: DC | PRN
  Administered 2011-06-08: 4 mg via INTRAVENOUS
  Filled 2011-06-08: qty 2

## 2011-06-08 MED ORDER — POLYETHYLENE GLYCOL 3350 17 G PO PACK
17.0000 g | PACK | Freq: Every day | ORAL | Status: DC
  Filled 2011-06-08: qty 1

## 2011-06-08 MED ORDER — GABAPENTIN 100 MG PO CAPS
100.0000 mg | ORAL_CAPSULE | Freq: Two times a day (BID) | ORAL | Status: DC
  Administered 2011-06-08 – 2011-06-18 (×21): 100 mg via ORAL
  Filled 2011-06-08 (×23): qty 1

## 2011-06-08 MED ORDER — HYDROMORPHONE HCL PF 1 MG/ML IJ SOLN
INTRAMUSCULAR | Status: AC
  Administered 2011-06-08: 1 mg
  Filled 2011-06-08: qty 1

## 2011-06-08 MED ORDER — DIPHENHYDRAMINE HCL (SLEEP) 25 MG PO TABS: 25.0000 mg | ORAL_TABLET | Freq: Every evening | ORAL | Status: DC | PRN

## 2011-06-08 MED ORDER — ACETAMINOPHEN 325 MG PO TABS
650.0000 mg | ORAL_TABLET | Freq: Four times a day (QID) | ORAL | Status: DC | PRN
  Administered 2011-06-13: 650 mg via ORAL
  Filled 2011-06-08 (×2): qty 2

## 2011-06-08 MED ORDER — OMEGA-3-ACID ETHYL ESTERS 1 G PO CAPS
2.0000 g | ORAL_CAPSULE | Freq: Two times a day (BID) | ORAL | Status: DC
  Administered 2011-06-08 – 2011-06-18 (×21): 2 g via ORAL
  Filled 2011-06-08 (×23): qty 2

## 2011-06-08 MED ORDER — OXYCODONE HCL 5 MG PO TABS
5.0000 mg | ORAL_TABLET | ORAL | Status: DC | PRN
  Administered 2011-06-09 – 2011-06-12 (×8): 10 mg via ORAL
  Administered 2011-06-13: 5 mg via ORAL
  Administered 2011-06-14 – 2011-06-16 (×2): 10 mg via ORAL
  Filled 2011-06-08: qty 1
  Filled 2011-06-08 (×14): qty 2

## 2011-06-08 MED ORDER — LEVOCETIRIZINE DIHYDROCHLORIDE 5 MG PO TABS
5.0000 mg | ORAL_TABLET | Freq: Every evening | ORAL | Status: DC
Start: 2011-06-08 — End: 2011-06-08

## 2011-06-08 MED ORDER — OXYCODONE-ACETAMINOPHEN 5-325 MG PO TABS
1.0000 | ORAL_TABLET | ORAL | Status: DC | PRN
  Administered 2011-06-08 (×2): 1 via ORAL
  Filled 2011-06-08 (×2): qty 1

## 2011-06-08 MED ORDER — ZOLPIDEM TARTRATE 5 MG PO TABS
5.0000 mg | ORAL_TABLET | Freq: Every evening | ORAL | Status: DC | PRN
  Administered 2011-06-09 – 2011-06-17 (×8): 5 mg via ORAL
  Filled 2011-06-08 (×8): qty 1

## 2011-06-08 MED ORDER — NIACIN ER (ANTIHYPERLIPIDEMIC) 500 MG PO TBCR
1000.0000 mg | EXTENDED_RELEASE_TABLET | Freq: Every day | ORAL | Status: DC
  Administered 2011-06-08 – 2011-06-17 (×10): 1000 mg via ORAL
  Filled 2011-06-08 (×11): qty 2

## 2011-06-08 MED ORDER — HYDROMORPHONE HCL PF 1 MG/ML IJ SOLN
1.0000 mg | INTRAMUSCULAR | Status: DC | PRN
  Administered 2011-06-08: 1 mg via INTRAVENOUS
  Filled 2011-06-08: qty 1

## 2011-06-08 MED ORDER — SODIUM CHLORIDE 0.9 % IV SOLN: 250.0000 mL | INTRAVENOUS | Status: DC | PRN

## 2011-06-08 MED ORDER — INSULIN ASPART 100 UNIT/ML ~~LOC~~ SOLN
0.0000 [IU] | Freq: Every day | SUBCUTANEOUS | Status: DC
  Administered 2011-06-08 – 2011-06-16 (×6): 2 [IU] via SUBCUTANEOUS

## 2011-06-08 MED ORDER — SODIUM CHLORIDE 0.9 % IJ SOLN: 3.0000 mL | INTRAMUSCULAR | Status: DC | PRN

## 2011-06-08 MED ORDER — ASPIRIN EC 81 MG PO TBEC
81.0000 mg | DELAYED_RELEASE_TABLET | Freq: Every day | ORAL | Status: DC
  Administered 2011-06-08 – 2011-06-18 (×11): 81 mg via ORAL
  Filled 2011-06-08 (×11): qty 1

## 2011-06-08 MED ORDER — FLUOXETINE HCL 10 MG PO CAPS
10.0000 mg | ORAL_CAPSULE | Freq: Every day | ORAL | Status: DC
  Administered 2011-06-08 – 2011-06-18 (×11): 10 mg via ORAL
  Filled 2011-06-08 (×11): qty 1

## 2011-06-08 MED ORDER — METFORMIN HCL 500 MG PO TABS
500.0000 mg | ORAL_TABLET | Freq: Two times a day (BID) | ORAL | Status: DC
  Filled 2011-06-08: qty 1

## 2011-06-08 MED ORDER — SODIUM CHLORIDE 0.9 % IJ SOLN
3.0000 mL | Freq: Two times a day (BID) | INTRAMUSCULAR | Status: DC
  Administered 2011-06-08: 3 mL via INTRAVENOUS

## 2011-06-08 MED ORDER — ALENDRONATE SODIUM 70 MG PO TABS: 70.0000 mg | ORAL_TABLET | ORAL | Status: DC

## 2011-06-08 MED ORDER — LOSARTAN POTASSIUM 50 MG PO TABS: 100.0000 mg | ORAL_TABLET | Freq: Every day | ORAL | Status: DC

## 2011-06-08 MED ORDER — GLIMEPIRIDE 2 MG PO TABS
2.0000 mg | ORAL_TABLET | Freq: Every day | ORAL | Status: DC
  Administered 2011-06-08 – 2011-06-18 (×10): 2 mg via ORAL
  Filled 2011-06-08 (×11): qty 1

## 2011-06-08 MED ORDER — ONDANSETRON HCL 4 MG PO TABS: 4.0000 mg | ORAL_TABLET | Freq: Four times a day (QID) | ORAL | Status: DC | PRN

## 2011-06-08 MED ORDER — DOCUSATE SODIUM 100 MG PO CAPS
100.0000 mg | ORAL_CAPSULE | Freq: Two times a day (BID) | ORAL | Status: DC
  Administered 2011-06-08 – 2011-06-18 (×19): 100 mg via ORAL
  Filled 2011-06-08 (×14): qty 1

## 2011-06-08 MED ORDER — SIMVASTATIN 40 MG PO TABS
40.0000 mg | ORAL_TABLET | Freq: Every evening | ORAL | Status: DC
  Administered 2011-06-08: 40 mg via ORAL
  Filled 2011-06-08 (×2): qty 1

## 2011-06-08 MED ORDER — INSULIN ASPART 100 UNIT/ML ~~LOC~~ SOLN
0.0000 [IU] | Freq: Three times a day (TID) | SUBCUTANEOUS | Status: DC
  Administered 2011-06-08 (×2): 5 [IU] via SUBCUTANEOUS
  Administered 2011-06-08: 2 [IU] via SUBCUTANEOUS
  Administered 2011-06-09: 3 [IU] via SUBCUTANEOUS
  Administered 2011-06-09: 5 [IU] via SUBCUTANEOUS
  Administered 2011-06-09: 3 [IU] via SUBCUTANEOUS
  Administered 2011-06-10: 5 [IU] via SUBCUTANEOUS
  Administered 2011-06-10: 3 [IU] via SUBCUTANEOUS
  Administered 2011-06-10: 15 [IU] via SUBCUTANEOUS
  Administered 2011-06-11: 8 [IU] via SUBCUTANEOUS
  Administered 2011-06-11: 5 [IU] via SUBCUTANEOUS
  Administered 2011-06-11 – 2011-06-12 (×3): 3 [IU] via SUBCUTANEOUS
  Administered 2011-06-12 – 2011-06-13 (×2): 8 [IU] via SUBCUTANEOUS
  Administered 2011-06-13: 5 [IU] via SUBCUTANEOUS
  Administered 2011-06-13: 8 [IU] via SUBCUTANEOUS
  Administered 2011-06-14: 3 [IU] via SUBCUTANEOUS
  Administered 2011-06-14 (×2): 5 [IU] via SUBCUTANEOUS
  Administered 2011-06-15 – 2011-06-16 (×2): 3 [IU] via SUBCUTANEOUS
  Administered 2011-06-16: 5 [IU] via SUBCUTANEOUS
  Administered 2011-06-16: 3 [IU] via SUBCUTANEOUS
  Administered 2011-06-17: 2 [IU] via SUBCUTANEOUS
  Administered 2011-06-17: 8 [IU] via SUBCUTANEOUS
  Administered 2011-06-18: 3 [IU] via SUBCUTANEOUS
  Administered 2011-06-18: 5 [IU] via SUBCUTANEOUS

## 2011-06-08 MED ORDER — DIPHENHYDRAMINE HCL 25 MG PO CAPS: 25.0000 mg | ORAL_CAPSULE | Freq: Every evening | ORAL | Status: DC | PRN

## 2011-06-08 MED ORDER — INSULIN GLARGINE 100 UNIT/ML ~~LOC~~ SOLN
15.0000 [IU] | Freq: Every day | SUBCUTANEOUS | Status: DC
  Administered 2011-06-08 – 2011-06-09 (×2): 15 [IU] via SUBCUTANEOUS

## 2011-06-08 MED ORDER — ENOXAPARIN SODIUM 40 MG/0.4ML ~~LOC~~ SOLN
40.0000 mg | SUBCUTANEOUS | Status: DC
  Administered 2011-06-08 – 2011-06-18 (×10): 40 mg via SUBCUTANEOUS
  Filled 2011-06-08 (×11): qty 0.4

## 2011-06-08 MED ORDER — SODIUM CHLORIDE 0.9 % IV SOLN
INTRAVENOUS | Status: DC
  Administered 2011-06-08: 16:00:00 via INTRAVENOUS

## 2011-06-08 MED ORDER — CETIRIZINE HCL 10 MG PO CHEW: 10.0000 mg | CHEWABLE_TABLET | Freq: Every day | ORAL | Status: DC

## 2011-06-08 MED ORDER — METFORMIN HCL 500 MG PO TABS
1000.0000 mg | ORAL_TABLET | Freq: Two times a day (BID) | ORAL | Status: DC
  Administered 2011-06-08 – 2011-06-09 (×3): 1000 mg via ORAL
  Filled 2011-06-08 (×5): qty 2

## 2011-06-08 MED ORDER — OLMESARTAN MEDOXOMIL 40 MG PO TABS
40.0000 mg | ORAL_TABLET | Freq: Every day | ORAL | Status: DC
  Administered 2011-06-08 – 2011-06-18 (×11): 40 mg via ORAL
  Filled 2011-06-08 (×11): qty 1

## 2011-06-08 MED ORDER — AMLODIPINE BESYLATE 2.5 MG PO TABS
2.5000 mg | ORAL_TABLET | Freq: Every day | ORAL | Status: DC
  Administered 2011-06-08 – 2011-06-18 (×11): 2.5 mg via ORAL
  Filled 2011-06-08 (×11): qty 1

## 2011-06-08 MED ORDER — LORATADINE 10 MG PO TABS
10.0000 mg | ORAL_TABLET | Freq: Every day | ORAL | Status: DC
  Administered 2011-06-08 – 2011-06-18 (×11): 10 mg via ORAL
  Filled 2011-06-08 (×11): qty 1

## 2011-06-08 MED ORDER — ATORVASTATIN CALCIUM 10 MG PO TABS
10.0000 mg | ORAL_TABLET | Freq: Every day | ORAL | Status: DC
  Administered 2011-06-08 – 2011-06-18 (×11): 10 mg via ORAL
  Filled 2011-06-08 (×11): qty 1

## 2011-06-08 NOTE — H&P (Signed)
PCP:   Loreen Freud, DO, DO   Chief Complaint: Persistent back pain   HPI: Mary Flynn is an 77 y.o. female with history of chronic lower back pain, diabetes, hypertension, hyperlipidemia, returned to [facility] because of persistent back pain. Yesterday, she had a mechanical fall that she was holding a plate and fell on a hard cement floor. She was seen and was sent home on Percocet. Pain persists, and she has trouble walking. Evaluation included a LS-spine plain film showing compressive fracture of L2 there is new. A CT confirmed L2 endplate fracture with 25 percent compression, along with her mild retropulsion and foramininal stenosis with some mild disc herniation as well. Other evaluations included normal CBC, negative UA, and unremarkable electrolytes, with normal renal functions. Hospitalist was asked to admit her for pain control. Orthopedic has been consulted.  Rewiew of Systems:  The patient denies anorexia, fever, weight loss,, vision loss, decreased hearing, hoarseness, chest pain, syncope, dyspnea on exertion, peripheral edema, balance deficits, hemoptysis, abdominal pain, melena, hematochezia, severe indigestion/heartburn, hematuria, incontinence, genital sores, muscle weakness, suspicious skin lesions, transient blindness, depression, unusual weight change, abnormal bleeding, enlarged lymph nodes, angioedema, and breast masses  Past Medical History  Diagnosis Date  . Diabetes mellitus   . Hyperlipidemia   . Hypertension     Past Surgical History  Procedure Date  . Appendectomy   . Surgical sterilization 40 years ago  . Refractive surgery     Left Eye    Medications:  HOME MEDS: Prior to Admission medications   Medication Sig Start Date End Date Taking? Authorizing Provider  alendronate (FOSAMAX) 70 MG tablet Take 1 tablet (70 mg total) by mouth every 7 (seven) days. Take with a full glass of water on an empty stomach. 04/21/11 04/20/12 Yes Yvonne R Lowne, DO    amLODipine (NORVASC) 2.5 MG tablet Take 1 tablet (2.5 mg total) by mouth daily. 04/21/11 04/20/12 Yes Grayling Congress Lowne, DO  aspirin EC 81 MG tablet Take 81 mg by mouth daily.   Yes Historical Provider, MD  atorvastatin (LIPITOR) 10 MG tablet Take 10 mg by mouth daily.   Yes Historical Provider, MD  Cetirizine HCl (ZYRTEC PO) Take 1 tablet by mouth daily as needed. Patient took this medication for her allergies.   Yes Historical Provider, MD  gabapentin (NEURONTIN) 100 MG capsule Take 1 capsule (100 mg total) by mouth 2 (two) times daily. 06/02/11  Yes Yvonne R Lowne, DO  levocetirizine (XYZAL) 5 MG tablet Take 1 tablet (5 mg total) by mouth every evening. 06/02/11 06/01/12 Yes Yvonne R Lowne, DO  metFORMIN (GLUCOPHAGE) 500 MG tablet Take 500 mg by mouth 2 (two) times daily with a meal.     Yes Historical Provider, MD  omega-3 acid ethyl esters (LOVAZA) 1 G capsule Take 2 capsules (2 g total) by mouth 2 (two) times daily. 07/03/10 07/03/11 Yes Yvonne R Lowne, DO  omeprazole (PRILOSEC) 20 MG capsule Take 1 capsule (20 mg total) by mouth daily. 06/02/11 06/01/12 Yes Yvonne R Lowne, DO  oxyCODONE-acetaminophen (PERCOCET) 5-325 MG per tablet Take 1 tablet by mouth every 4 (four) hours as needed for pain. May take 2 tablets PO q 6 hours for severe pain - Do not take with Tylenol as this tablet already contains tylenol 06/07/11 06/17/11 Yes Vida Roller, MD  simvastatin (ZOCOR) 40 MG tablet Take 40 mg by mouth every evening.   Yes Historical Provider, MD  sitaGLIPtan-metformin (JANUMET) 50-500 MG per tablet Take 1  tablet by mouth 2 (two) times daily with a meal.     Yes Historical Provider, MD  valsartan (DIOVAN) 320 MG tablet Take 320 mg by mouth daily.     Yes Historical Provider, MD  diphenhydrAMINE (SOMINEX) 25 MG tablet Take 25 mg by mouth at bedtime as needed. For allergies    Historical Provider, MD  FLUoxetine (PROZAC) 10 MG capsule TAKE 1 CAPSULE DAILY 01/16/11   Lelon Perla, DO  FLUoxetine (PROZAC) 10 MG  capsule Take 10 mg by mouth daily.    Historical Provider, MD  glimepiride (AMARYL) 2 MG tablet Take 1 tablet by mouth daily. 07/07/10   Historical Provider, MD  Insulin Detemir (LEVEMIR FLEXPEN [place]) Inject 18 Units into the skin at bedtime.      Historical Provider, MD  losartan (COZAAR) 100 MG tablet Take 1 tablet (100 mg total) by mouth daily. 04/21/11 04/20/12  Lelon Perla, DO  Multiple Vitamin (MULITIVITAMIN WITH MINERALS) TABS Take 1 tablet by mouth daily.    Historical Provider, MD  niacin (NIASPAN) 1000 MG CR tablet Take 1,000 mg by mouth at bedtime.      Historical Provider, MD  VITAMIN E PO Take 1 tablet by mouth daily.    Historical Provider, MD  zolpidem (AMBIEN) 10 MG tablet Take 1 tablet (10 mg total) by mouth at bedtime as needed for sleep. 01/29/11 02/28/11  Lelon Perla, DO  zolpidem (AMBIEN) 10 MG tablet Take 1 tablet (10 mg total) by mouth at bedtime as needed for sleep. 04/02/11 05/02/11  Lelon Perla, DO  zolpidem (AMBIEN) 5 MG tablet 5 mg. 1 or 2  At bedtime if needed 04/22/11 04/21/12  Waymon Budge, MD     Allergies:  Allergies  Allergen Reactions  . Penicillins   . Sulfonamide Derivatives     Social History:   reports that she has never smoked. She has never used smokeless tobacco. She reports that she does not drink alcohol or use illicit drugs.  Family History: Family History  Problem Relation Age of Onset  . Depression    . Cancer      laryngeal  . Diabetes       Physical Exam: Filed Vitals:   06/07/11 2323 06/07/11 2335 06/08/11 0044  BP: 171/74 171/74 156/78  Pulse:  96 95  Temp:  98.5 F (36.9 C) 96.7 F (35.9 C)  TempSrc:  Oral Oral  Resp:  18 16  Height:   4\' 11"  (1.499 m)  Weight:   64.3 kg (141 lb 12.1 oz)  SpO2:  92% 93%   Blood pressure 156/78, pulse 95, temperature 96.7 F (35.9 C), temperature source Oral, resp. rate 16, height 4\' 11"  (1.499 m), weight 64.3 kg (141 lb 12.1 oz), SpO2 93.00%.  GEN:  Pleasant person lying in the  stretcher in no acute distress; cooperative with exam PSYCH:  alert and oriented x4; does not appear anxious or depressed; affect is appropriate. HEENT: Mucous membranes pink and anicteric; PERRLA; EOM intact; no cervical lymphadenopathy nor thyromegaly or carotid bruit; no JVD; Breasts:: Not examined CHEST WALL: No tenderness CHEST: Normal respiration, clear to auscultation bilaterally HEART: Regular rate and rhythm; no murmurs rubs or gallops BACK: No kyphosis or scoliosis; no CVA tenderness ABDOMEN: Obese, soft non-tender; no masses, no organomegaly, normal abdominal bowel sounds; no pannus; no intertriginous candida. Rectal Exam: Not done EXTREMITIES: No bone or joint deformity; age-appropriate arthropathy of the hands and knees; no edema; no ulcerations. Genitalia: not examined PULSES:  2+ and symmetric SKIN: Normal hydration no rash or ulceration CNS: Cranial nerves 2-12 grossly intact no focal lateralizing neurologic deficit. Her strengths are strong and equal bilaterally.   Labs & Imaging Results for orders placed during the hospital encounter of 06/07/11 (from the past 48 hour(s))  CBC     Status: Normal   Collection Time   06/07/11  4:30 PM      Component Value Range Comment   WBC 9.3  4.0 - 10.5 (K/uL)    RBC 4.53  3.87 - 5.11 (MIL/uL)    Hemoglobin 12.8  12.0 - 15.0 (g/dL)    HCT 32.4  40.1 - 02.7 (%)    MCV 80.1  78.0 - 100.0 (fL)    MCH 28.3  26.0 - 34.0 (pg)    MCHC 35.3  30.0 - 36.0 (g/dL)    RDW 25.3  66.4 - 40.3 (%)    Platelets 200  150 - 400 (K/uL)   DIFFERENTIAL     Status: Normal   Collection Time   06/07/11  4:30 PM      Component Value Range Comment   Neutrophils Relative 73  43 - 77 (%)    Neutro Abs 6.8  1.7 - 7.7 (K/uL)    Lymphocytes Relative 15  12 - 46 (%)    Lymphs Abs 1.4  0.7 - 4.0 (K/uL)    Monocytes Relative 11  3 - 12 (%)    Monocytes Absolute 1.0  0.1 - 1.0 (K/uL)    Eosinophils Relative 0  0 - 5 (%)    Eosinophils Absolute 0.0  0.0 - 0.7  (K/uL)    Basophils Relative 0  0 - 1 (%)    Basophils Absolute 0.0  0.0 - 0.1 (K/uL)   COMPREHENSIVE METABOLIC PANEL     Status: Abnormal   Collection Time   06/07/11  4:30 PM      Component Value Range Comment   Sodium 134 (*) 135 - 145 (mEq/L)    Potassium 3.7  3.5 - 5.1 (mEq/L)    Chloride 95 (*) 96 - 112 (mEq/L)    CO2 26  19 - 32 (mEq/L)    Glucose, Bld 193 (*) 70 - 99 (mg/dL)    BUN 9  6 - 23 (mg/dL)    Creatinine, Ser 4.74  0.50 - 1.10 (mg/dL)    Calcium 9.7  8.4 - 10.5 (mg/dL)    Total Protein 7.0  6.0 - 8.3 (g/dL)    Albumin 4.2  3.5 - 5.2 (g/dL)    AST 23  0 - 37 (U/L)    ALT 21  0 - 35 (U/L)    Alkaline Phosphatase 49  39 - 117 (U/L)    Total Bilirubin 1.3 (*) 0.3 - 1.2 (mg/dL)    GFR calc non Af Amer >90  >90 (mL/min)    GFR calc Af Amer >90  >90 (mL/min)   LIPASE, BLOOD     Status: Normal   Collection Time   06/07/11  4:30 PM      Component Value Range Comment   Lipase 31  11 - 59 (U/L)   GLUCOSE, CAPILLARY     Status: Abnormal   Collection Time   06/07/11 11:48 PM      Component Value Range Comment   Glucose-Capillary 192 (*) 70 - 99 (mg/dL)   CBC     Status: Normal   Collection Time   06/08/11  1:45 AM      Component Value  Range Comment   WBC 8.1  4.0 - 10.5 (K/uL)    RBC 4.51  3.87 - 5.11 (MIL/uL)    Hemoglobin 12.8  12.0 - 15.0 (g/dL)    HCT 16.1  09.6 - 04.5 (%)    MCV 81.4  78.0 - 100.0 (fL)    MCH 28.4  26.0 - 34.0 (pg)    MCHC 34.9  30.0 - 36.0 (g/dL)    RDW 40.9  81.1 - 91.4 (%)    Platelets 190  150 - 400 (K/uL)    Dg Chest 2 View  06/07/2011  *RADIOLOGY REPORT*  Clinical Data: Cough.  Hypertension.  Diabetes.  CHEST - 2 VIEW  Comparison: 02/12/2011  Findings: Bilateral linear opacities are stable consistent with scarring.  No evidence of acute infiltrate or edema.  No evidence of pleural effusion.  Heart size and mediastinal contours are stable.  IMPRESSION: Stable bilateral scarring.  No acute findings.  Original Report Authenticated By: Danae Orleans, M.D.   Dg Lumbar Spine Complete  06/07/2011  *RADIOLOGY REPORT*  Clinical Data: Fall.  Low back injury and pain.  LUMBAR SPINE - COMPLETE 4+ VIEW  Comparison: 12/05/2004  Findings: Generalized osteopenia is noted.  An old mild compression deformity of the superior endplate of T12 vertebral body is stable.  A mild superior endplate compression of the L2 vertebral body is also seen, which is new since 2006 exam, but is of indeterminate age radiographically.  No other lumbar spine fracture identified.  No evidence of subluxation.  Intervertebral disc spaces are maintained.  IMPRESSION:  1.  Mild L2 superior endplate compression fracture deformity.  This is of indeterminate age radiographically, but is new since 2006 exam. 2.  Stable old superior endplate compression of T12 vertebral body. 3.  Osteopenia.  Original Report Authenticated By: Danae Orleans, M.D.   Ct Lumbar Spine Wo Contrast  06/07/2011  *RADIOLOGY REPORT*  Clinical Data: Fall.  Compression fracture.  Pain.  CT LUMBAR SPINE WITHOUT CONTRAST  Technique:  Multidetector CT imaging of the lumbar spine was performed without intravenous contrast administration. Multiplanar CT image reconstructions were also generated.  Comparison: Lumbar spine radiographs 06/06/2011.  Findings: The lumbar spine is imaged from midbody of T12 through S2.  Alignment is maintained.  Atherosclerotic calcifications are present in the aorta and branch vessels without aneurysm.  Limited imaging of the abdomen is otherwise unremarkable.  The superior endplate fracture of T12 is incompletely imaged.  By radiograph, this is remote on the prior studies.  The superior endplate compression fracture at L2 appears acute. There is slight retropulsion of bone along the superior endplate, measuring 4 mm.  There is no significant stenosis.  L2-3:  A leftward disc herniation results in mild left foraminal stenosis.  L3-4:  A leftward disc herniation results in mild left foraminal  stenosis.  L4-5:  Mild broad-based disc herniation is present.  Facet hypertrophy is noted bilaterally.  This results in mild central and bilateral foraminal narrowing.  L5-S1:  A shallow central disc bulges present without significant stenosis.  IMPRESSION:  1.  Superior endplate compression fracture at L2 with 25% loss of height relative to the adjacent segment. 2.  Slight retropulsion of bone along the superior endplate of L2 measures 4 mm without significant associated stenosis. 3.  Leftward disc herniations at L2-3 and L3-4 results in mild left foraminal narrowing. 4.  Broad-based disc herniation at L4-5 with mild central and bilateral foraminal narrowing.  Facet hypertrophy contributes. 5.  Shallow central  disc protrusion at L5-S1 without significant stenosis. 6.  The superior endplate fracture at T12 is incompletely imaged.  Original Report Authenticated By: Jamesetta Orleans. MATTERN, M.D.      Assessment Present on Admission:  .OSTEOPOROSIS NOS .BACK PAIN .HYPERTENSION .HYPERLIPIDEMIA .DM w/o Complication Type II   PLAN:  We will admit her for pain control. Orthopedics has already been consulted, but she is unlikely to need surgery. For her diabetes will continue her medications along with instituting insulin sliding scales. Continue her antihypertensive medication and her cholesterol medications. She is stable, full code, and will be admitted to [facility].   Other plans as per orders.    Letesha Klecker 06/08/2011, 2:41 AM

## 2011-06-08 NOTE — Telephone Encounter (Signed)
[facility] Triage Call Report Triage Record Num: [id number] Operator: April Finney Patient Name: Mary Flynn Call Date & Time: 06/07/2011 1:06:25PM Patient Phone: [phone] PCP: Lelon Perla Patient Gender: Female PCP Fax : [phone] Patient DOB: 1934/04/30 Practice Name: [facility] Reason for Call: Caller: [place]/Spouse; PCP: Lelon Perla.; CB#: [phone]; Call regarding Hair line fx in back. Unable to walk and having difficulty with breathing. She states she can not roll over in bed or turn. Has taken pain medication. Call 911 care advice given. Protocol(s) Used: Back Symptoms Recommended Outcome per Protocol: Activate EMS 911 Reason for Outcome: Following significant trauma or injury to neck or back AND immobile since event Care Advice: ~ Do not give the patient anything to eat or drink. ~ An adult should stay with the patient, preferably one trained in CPR. ~ IMMEDIATE ACTION Write down provider's name. List or place the following in a bag for transport with the patient: current prescription and/or nonprescription medications; alternative treatments, therapies and medications; and street drugs. ~ Spinal Immobilization: - If head, neck, or spinal injury or disease is known, or suspected, tell the patient not to move. - Do not move the person unless there is an immediate threat to their life. - If moving becomes absolutely necessary, immobilize head, neck and spine. - Then move the patient's head, neck and body as a single unit to prevent or not worsen spinal cord injury. - If vomiting, immobilize, then roll patient to side with head, neck and body as a single unit. Do not turn or move head or neck. ~ 06/07/2011 2:01:23PM

## 2011-06-08 NOTE — Telephone Encounter (Signed)
Pt seen in ED 

## 2011-06-08 NOTE — Progress Notes (Signed)
2:28 AM  PHARMACIST - PHYSICIAN COMMUNICATION  CONCERNING: P&T Medication Policy Regarding Oral Bisphosphonates  RECOMMENDATION: Your order for alendronate (Fosamax), ibandronate (Boniva), or risedronate (Actonel) has been discontinued at this time.  If the patient's post-hospital medical condition warrants safe use of this class of drugs, please resume the pre-hospital regimen upon discharge.  DESCRIPTION:  Alendronate (Fosamax), ibandronate (Boniva), and risedronate (Actonel) can cause severe esophageal erosions in patients who are unable to remain upright at least 30 minutes after taking this medication.   Since brief interruptions in therapy are thought to have minimal impact on bone mineral density, the Pharmacy & Therapeutics Committee has established that bisphosphonate orders should be routinely discontinued during hospitalization.   To override this safety policy and permit administration of Boniva, Fosamax, or Actonel in the hospital, prescribers must write "DO NOT HOLD" in the comments section when placing the order for this class of medications.   Janice Coffin

## 2011-06-08 NOTE — Progress Notes (Signed)
[facility] [place] TEAM 1 - Stepdown/ICU TEAM  Subjective: 77 y.o. female with history of chronic lower back pain, diabetes, hypertension, hyperlipidemia, returned to [facility] because of persistent back pain. Yesterday, she had a mechanical fall that she was holding a plate and fell on a hard cement floor. She was seen and was sent home on Percocet. Pain persists, and she has trouble walking. Evaluation included a LS-spine plain film showing compressive fracture of L2 there is new. A CT confirmed L2 endplate fracture with 25 percent compression, along with her mild retropulsion and foramininal stenosis with some mild disc herniation as well.  Orthopedics has been consulted.   Pt is seen for a f/u visit.  Objective: Weight change:   Intake/Output Summary (Last 24 hours) at 06/08/11 1455 Last data filed at 06/08/11 1300  Gross per 24 hour  Intake    660 ml  Output    400 ml  Net    260 ml   Blood pressure 156/77, pulse 105, temperature 98.3 F (36.8 C), temperature source Oral, resp. rate 18, height 4\' 11"  (1.499 m), weight 64.3 kg (141 lb 12.1 oz), SpO2 90.00%.  CBG (last 3)   Basename 06/08/11 1130 06/08/11 0713 06/07/11 2348  GLUCAP 131* 206* 192*   Physical Exam: Follow up exam completed  Lab Results:  [facility] 06/08/11 0612 06/07/11 1630  NA 135 134*  K 3.7 3.7  CL 98 95*  CO2 25 26  GLUCOSE 240* 193*  BUN 8 9  CREATININE 0.44* 0.50  CALCIUM 9.1 9.7  MG -- --  PHOS -- --    Basename 06/07/11 1630  AST 23  ALT 21  ALKPHOS 49  BILITOT 1.3*  PROT 7.0  ALBUMIN 4.2    Basename 06/08/11 0612 06/08/11 0145 06/07/11 1630  WBC 8.8 8.1 9.3  NEUTROABS -- -- 6.8  HGB 12.8 12.8 12.8  HCT 38.1 36.7 36.3  MCV 82.5 81.4 80.1  PLT 209 190 200    Basename 06/08/11 0145  HGBA1C 6.8*   Micro Results: No results found for this or any previous visit (from the past 240 hour(s)).  Studies/Results: All recent x-ray/radiology reports have been  reviewed in detail.   Medications: I have reviewed the patient's complete medication list.  Assessment/Plan:  Acute L2 compression fx Orthopedics was consulted by the admitting MD - I suspect PT/OT and pain control will be all that is required  Constipation Pt has bowel sounds on exam - abdom is protuberant but not firm or tender - will begin to stimulate bowels - clear liquids only until BM  HTN  Hyperlipidemia  DM2  Lonia Blood, MD [facility] Office  [phone] Pager [phone]  On-Call/Text Page:      [email or web address]      password [facility]

## 2011-06-09 LAB — BASIC METABOLIC PANEL
BUN: 8 mg/dL (ref 6–23)
Calcium: 8.5 mg/dL (ref 8.4–10.5)
Creatinine, Ser: 0.5 mg/dL (ref 0.50–1.10)
GFR calc Af Amer: >90 mL/min (ref >90)

## 2011-06-09 LAB — GLUCOSE, CAPILLARY

## 2011-06-09 LAB — MAGNESIUM: Magnesium: 1.8 mg/dL (ref 1.5–2.5)

## 2011-06-09 NOTE — Progress Notes (Signed)
Subjective: Patient still complaining of back pain. Had small BM yesterday.  Objective: Filed Vitals:   06/08/11 1335 06/08/11 1829 06/08/11 2200 06/09/11 0550  BP: 156/77 135/79 159/87 130/70  Pulse: 105 96 106 105  Temp: 98.3 F (36.8 C) 98.5 F (36.9 C) 98.4 F (36.9 C) 97.9 F (36.6 C)  TempSrc: Oral Oral    Resp: 18 20 18 18   Height:      Weight:    63.7 kg (140 lb 6.9 oz)  SpO2:  90% 90% 91%   Weight change: -0.6 kg (-1 lb 5.2 oz)  Intake/Output Summary (Last 24 hours) at 06/09/11 1246 Last data filed at 06/09/11 0800  Gross per 24 hour  Intake    720 ml  Output    400 ml  Net    320 ml    General: Alert, awake, oriented x3, in no acute distress.  HEENT: No bruits, no goiter.  Heart: Regular rate and rhythm, without murmurs, rubs, gallops.  Lungs: CTA, bilateral air movement.  Abdomen: Soft, nontender, nondistended, positive bowel sounds.  Neuro: Grossly intact, nonfocal. Extremities; no edema.   Lab Results:  [facility] 06/09/11 0550 06/08/11 0612  NA 133* 135  K 3.8 3.7  CL 96 98  CO2 26 25  GLUCOSE 186* 240*  BUN 8 8  CREATININE 0.50 0.44*  CALCIUM 8.5 9.1  MG 1.8 --  PHOS -- --    Basename 06/07/11 1630  AST 23  ALT 21  ALKPHOS 49  BILITOT 1.3*  PROT 7.0  ALBUMIN 4.2    Basename 06/07/11 1630  LIPASE 31  AMYLASE --    Basename 06/08/11 0612 06/08/11 0145 06/07/11 1630  WBC 8.8 8.1 --  NEUTROABS -- -- 6.8  HGB 12.8 12.8 --  HCT 38.1 36.7 --  MCV 82.5 81.4 --  PLT 209 190 --    Basename 06/08/11 0145  HGBA1C 6.8*    Micro Results: No results found for this or any previous visit (from the past 240 hour(s)).  Studies/Results: Ct Lumbar Spine Wo Contrast  06/07/2011  *RADIOLOGY REPORT*  Clinical Data: Fall.  Compression fracture.  Pain.  CT LUMBAR SPINE WITHOUT CONTRAST  Technique:  Multidetector CT imaging of the lumbar spine was performed without intravenous contrast administration. Multiplanar CT image reconstructions were also  generated.  Comparison: Lumbar spine radiographs 06/06/2011.  Findings: The lumbar spine is imaged from midbody of T12 through S2.  Alignment is maintained.  Atherosclerotic calcifications are present in the aorta and branch vessels without aneurysm.  Limited imaging of the abdomen is otherwise unremarkable.  The superior endplate fracture of T12 is incompletely imaged.  By radiograph, this is remote on the prior studies.  The superior endplate compression fracture at L2 appears acute. There is slight retropulsion of bone along the superior endplate, measuring 4 mm.  There is no significant stenosis.  L2-3:  A leftward disc herniation results in mild left foraminal stenosis.  L3-4:  A leftward disc herniation results in mild left foraminal stenosis.  L4-5:  Mild broad-based disc herniation is present.  Facet hypertrophy is noted bilaterally.  This results in mild central and bilateral foraminal narrowing.  L5-S1:  A shallow central disc bulges present without significant stenosis.  IMPRESSION:  1.  Superior endplate compression fracture at L2 with 25% loss of height relative to the adjacent segment. 2.  Slight retropulsion of bone along the superior endplate of L2 measures 4 mm without significant associated stenosis. 3.  Leftward disc herniations at L2-3  and L3-4 results in mild left foraminal narrowing. 4.  Broad-based disc herniation at L4-5 with mild central and bilateral foraminal narrowing.  Facet hypertrophy contributes. 5.  Shallow central disc protrusion at L5-S1 without significant stenosis. 6.  The superior endplate fracture at T12 is incompletely imaged.  Original Report Authenticated By: Jamesetta Orleans. MATTERN, M.D.    Medications: I have reviewed the patient's current medications.   Patient Active Hospital Problem List:  Compression fracture of L2 lumbar vertebra (06/08/2011) Acute low back pain due to trauma (12/20/2008) I spoke with Ortho on call, they don't due compression fracture.  Recommend neurosurgery. I consulted neurosurgery. Continue with pain medications.   DM w/o Complication Type II (08/05/2006) Hold metformin. Continue Amaryl, Lantus, linagliptin.   HYPERLIPIDEMIA (08/05/2006) Continue with Lipitor.   HYPERTENSION (08/05/2006) Continue with benicar, norvasc.  OSTEOPOROSIS NOS (08/05/2006)         LOS: 2 days   Juana Haralson M.D.  [facility] 06/09/2011, 12:46 PM

## 2011-06-09 NOTE — Progress Notes (Signed)
Inpatient Diabetes Program Recommendations  AACE/ADA: New Consensus Statement on Inpatient Glycemic Control (2009)  Target Ranges:  Prepandial:   less than 140 mg/dL      Peak postprandial:   less than 180 mg/dL (1-2 hours)      Critically ill patients:  140 - 180 mg/dL    Inpatient Diabetes Program Recommendations Insulin - Basal: Increase Lantus to 18 units   Thank you  Piedad Climes [facility] Inpatient Diabetes Coordinator [phone]

## 2011-06-10 ENCOUNTER — Other Ambulatory Visit: Payer: Self-pay | Admitting: Neurological Surgery

## 2011-06-10 LAB — GLUCOSE, CAPILLARY
Glucose-Capillary: 180 mg/dL — ABNORMAL HIGH (ref 70–99)
Glucose-Capillary: 191 mg/dL — ABNORMAL HIGH (ref 70–99)
Glucose-Capillary: 245 mg/dL — ABNORMAL HIGH (ref 70–99)
Glucose-Capillary: 275 mg/dL — ABNORMAL HIGH (ref 70–99)
Glucose-Capillary: 215 mg/dL — ABNORMAL HIGH (ref 70–99)

## 2011-06-10 MED ORDER — HYDROMORPHONE HCL PF 1 MG/ML IJ SOLN
0.5000 mg | INTRAMUSCULAR | Status: DC | PRN
  Administered 2011-06-13 – 2011-06-15 (×3): 0.5 mg via INTRAVENOUS
  Filled 2011-06-10 (×3): qty 1

## 2011-06-10 MED ORDER — INSULIN GLARGINE 100 UNIT/ML ~~LOC~~ SOLN
18.0000 [IU] | Freq: Every day | SUBCUTANEOUS | Status: DC
  Administered 2011-06-10 – 2011-06-13 (×4): 18 [IU] via SUBCUTANEOUS

## 2011-06-10 NOTE — Progress Notes (Signed)
Subjective: Patient requesting a full vegetarian diet Back pain controlled with pain medications   Objective: Vital signs in last 24 hours: Filed Vitals:   06/09/11 0550 06/09/11 1400 06/09/11 2200 06/10/11 0600  BP: 130/70 106/67 159/76 135/80  Pulse: 105 100 100 108  Temp: 97.9 F (36.6 C) 98.1 F (36.7 C) 99 F (37.2 C) 97.6 F (36.4 C)  TempSrc:  Oral Oral Oral  Resp: 18 18 18 18   Height:      Weight: 63.7 kg (140 lb 6.9 oz)   62.5 kg (137 lb 12.6 oz)  SpO2: 91% 91% 92% 95%   Weight change: -1.2 kg (-2 lb 10.3 oz)  Intake/Output Summary (Last 24 hours) at 06/10/11 0920 Last data filed at 06/09/11 1700  Gross per 24 hour  Intake    720 ml  Output      0 ml  Net    720 ml    Physical Exam: General: Awake, Oriented, No acute distress. HEENT: EOMI. Neck: Supple CV: S1 and S2, rrr Lungs: Clear to ascultation bilaterally, no wheezing Abdomen: Soft, Nontender, Nondistended, +bowel sounds. Ext: Good pulses. No edema.   Lab Results:  [facility] 06/09/11 0550 06/08/11 0612  NA 133* 135  K 3.8 3.7  CL 96 98  CO2 26 25  GLUCOSE 186* 240*  BUN 8 8  CREATININE 0.50 0.44*  CALCIUM 8.5 9.1  MG 1.8 --  PHOS -- --    Basename 06/07/11 1630  AST 23  ALT 21  ALKPHOS 49  BILITOT 1.3*  PROT 7.0  ALBUMIN 4.2    Basename 06/07/11 1630  LIPASE 31  AMYLASE --    Basename 06/08/11 0612 06/08/11 0145 06/07/11 1630  WBC 8.8 8.1 --  NEUTROABS -- -- 6.8  HGB 12.8 12.8 --  HCT 38.1 36.7 --  MCV 82.5 81.4 --  PLT 209 190 --   No results found for this basename: CKTOTAL:3,CKMB:3,CKMBINDEX:3,TROPONINI:3 in the last 72 hours No components found with this basename: POCBNP:3 No results found for this basename: DDIMER:2 in the last 72 hours  Basename 06/08/11 0145  HGBA1C 6.8*   No results found for this basename: CHOL:2,HDL:2,LDLCALC:2,TRIG:2,CHOLHDL:2,LDLDIRECT:2 in the last 72 hours No results found for this basename: TSH,T4TOTAL,FREET3,T3FREE,THYROIDAB in the  last 72 hours No results found for this basename: VITAMINB12:2,FOLATE:2,FERRITIN:2,TIBC:2,IRON:2,RETICCTPCT:2 in the last 72 hours  Micro Results: No results found for this or any previous visit (from the past 240 hour(s)).  Studies/Results: No results found.  Medications: I have reviewed the patient's current medications. Scheduled Meds:   . amLODipine  2.5 mg Oral Daily  . aspirin EC  81 mg Oral Daily  . atorvastatin  10 mg Oral Daily  . docusate sodium  100 mg Oral BID  . enoxaparin  40 mg Subcutaneous Q24H  . FLUoxetine  10 mg Oral Daily  . gabapentin  100 mg Oral BID  . glimepiride  2 mg Oral Daily  . insulin aspart  0-15 Units Subcutaneous TID WC  . insulin aspart  0-5 Units Subcutaneous QHS  . insulin glargine  18 Units Subcutaneous QHS  . linagliptin  5 mg Oral Daily  . loratadine  10 mg Oral Daily  . niacin  1,000 mg Oral QHS  . olmesartan  40 mg Oral Daily  . omega-3 acid ethyl esters  2 g Oral BID  . pantoprazole  40 mg Oral Q1200  . polyethylene glycol  17 g Oral BID  . senna  1 tablet Oral Daily  . DISCONTD: insulin  glargine  15 Units Subcutaneous QHS  . DISCONTD: metFORMIN  1,000 mg Oral BID WC  . DISCONTD: simvastatin  40 mg Oral QPM   Continuous Infusions:   . DISCONTD: sodium chloride 50 mL/hr at 06/08/11 1613   PRN Meds:.acetaminophen, bisacodyl, HYDROmorphone, ondansetron (ZOFRAN) IV, ondansetron, oxyCODONE, zolpidem  Assessment/Plan: Compression fracture of L2 lumbar vertebra (06/08/2011)  Acute low back pain due to trauma (12/20/2008)  Neurosurgery recommends PT and Continue with pain medications.   DM w/o Complication Type II (08/05/2006) Hold metformin. Continue Amaryl, Lantus, linagliptin- increase lantus  HYPERLIPIDEMIA (08/05/2006) Continue with Lipitor.   HYPERTENSION (08/05/2006) Continue with benicar, norvasc.   OSTEOPOROSIS NOS (08/05/2006)         LOS: 3 days  Kaliel Bolds, DO 06/10/2011, 9:20 AM

## 2011-06-10 NOTE — Progress Notes (Signed)
Patient ID: Mary Flynn, female   DOB: [birth year], 77 y.o.   MRN: [id number] Patient attempting mobilization but still appears to have substantial pain. I have posted acrylic balloon kyphoplasty for Friday afternoon. Discussed situation with patient. She is agreeable to proceeding.

## 2011-06-10 NOTE — Consult Note (Signed)
Reason for Consult:L2 Fracture Referring Physician: Naila Elizondo is an 77 y.o. female.  HPI: Patient sustained fall unto buttocks several days ago. Has ben immoblized with severe pain. CT shows L2 compression fracture. Patient gives history of long standing back pain. She also has hx of diabetes.  Past Medical History  Diagnosis Date  . Diabetes mellitus   . Hyperlipidemia   . Hypertension     Past Surgical History  Procedure Date  . Appendectomy   . Surgical sterilization 40 years ago  . Refractive surgery     Left Eye    Family History  Problem Relation Age of Onset  . Depression    . Cancer      laryngeal  . Diabetes      Social History:  reports that she has never smoked. She has never used smokeless tobacco. She reports that she does not drink alcohol or use illicit drugs.  Allergies:  Allergies  Allergen Reactions  . Penicillins   . Sulfonamide Derivatives     Medications:  Prior to Admission:  Prescriptions prior to admission  Medication Sig Dispense Refill  . alendronate (FOSAMAX) 70 MG tablet Take 1 tablet (70 mg total) by mouth every 7 (seven) days. Take with a full glass of water on an empty stomach.  12 tablet  3  . amLODipine (NORVASC) 2.5 MG tablet Take 1 tablet (2.5 mg total) by mouth daily.  90 tablet  3  . aspirin EC 81 MG tablet Take 81 mg by mouth daily.      Marland Kitchen atorvastatin (LIPITOR) 10 MG tablet Take 10 mg by mouth daily.      . Cetirizine HCl (ZYRTEC PO) Take 1 tablet by mouth daily as needed. Patient took this medication for her allergies.      Marland Kitchen gabapentin (NEURONTIN) 100 MG capsule Take 1 capsule (100 mg total) by mouth 2 (two) times daily.  180 capsule  1  . levocetirizine (XYZAL) 5 MG tablet Take 1 tablet (5 mg total) by mouth every evening.  90 tablet  1  . metFORMIN (GLUCOPHAGE) 500 MG tablet Take 500 mg by mouth 2 (two) times daily with a meal.        . omega-3 acid ethyl esters (LOVAZA) 1 G capsule Take 2 capsules (2 g total) by  mouth 2 (two) times daily.  360 capsule  0  . omeprazole (PRILOSEC) 20 MG capsule Take 1 capsule (20 mg total) by mouth daily.  90 capsule  1  . oxyCODONE-acetaminophen (PERCOCET) 5-325 MG per tablet Take 1 tablet by mouth every 4 (four) hours as needed for pain. May take 2 tablets PO q 6 hours for severe pain - Do not take with Tylenol as this tablet already contains tylenol  15 tablet  0  . simvastatin (ZOCOR) 40 MG tablet Take 40 mg by mouth every evening.      . sitaGLIPtan-metformin (JANUMET) 50-500 MG per tablet Take 1 tablet by mouth 2 (two) times daily with a meal.        . valsartan (DIOVAN) 320 MG tablet Take 320 mg by mouth daily.        . diphenhydrAMINE (SOMINEX) 25 MG tablet Take 25 mg by mouth at bedtime as needed. For allergies      . FLUoxetine (PROZAC) 10 MG capsule TAKE 1 CAPSULE DAILY  90 capsule  1  . FLUoxetine (PROZAC) 10 MG capsule Take 10 mg by mouth daily.      Marland Kitchen glimepiride (AMARYL)  2 MG tablet Take 1 tablet by mouth daily.      . Insulin Detemir (LEVEMIR FLEXPEN [place]) Inject 18 Units into the skin at bedtime.        Marland Kitchen losartan (COZAAR) 100 MG tablet Take 1 tablet (100 mg total) by mouth daily.  90 tablet  1  . Multiple Vitamin (MULITIVITAMIN WITH MINERALS) TABS Take 1 tablet by mouth daily.      . niacin (NIASPAN) 1000 MG CR tablet Take 1,000 mg by mouth at bedtime.        Marland Kitchen VITAMIN E PO Take 1 tablet by mouth daily.      Marland Kitchen zolpidem (AMBIEN) 10 MG tablet Take 1 tablet (10 mg total) by mouth at bedtime as needed for sleep.  30 tablet  0  . zolpidem (AMBIEN) 10 MG tablet Take 1 tablet (10 mg total) by mouth at bedtime as needed for sleep.  30 tablet  0  . zolpidem (AMBIEN) 5 MG tablet 5 mg. 1 or 2  At bedtime if needed        Results for orders placed during the hospital encounter of 06/07/11 (from the past 48 hour(s))  BASIC METABOLIC PANEL     Status: Abnormal   Collection Time   06/08/11  6:12 AM      Component Value Range Comment   Sodium 135  135 - 145 (mEq/L)     Potassium 3.7  3.5 - 5.1 (mEq/L)    Chloride 98  96 - 112 (mEq/L)    CO2 25  19 - 32 (mEq/L)    Glucose, Bld 240 (*) 70 - 99 (mg/dL)    BUN 8  6 - 23 (mg/dL)    Creatinine, Ser 1.61 (*) 0.50 - 1.10 (mg/dL)    Calcium 9.1  8.4 - 10.5 (mg/dL)    GFR calc non Af Amer >90  >90 (mL/min)    GFR calc Af Amer >90  >90 (mL/min)   CBC     Status: Normal   Collection Time   06/08/11  6:12 AM      Component Value Range Comment   WBC 8.8  4.0 - 10.5 (K/uL)    RBC 4.62  3.87 - 5.11 (MIL/uL)    Hemoglobin 12.8  12.0 - 15.0 (g/dL)    HCT 09.6  04.5 - 40.9 (%)    MCV 82.5  78.0 - 100.0 (fL)    MCH 27.7  26.0 - 34.0 (pg)    MCHC 33.6  30.0 - 36.0 (g/dL)    RDW 81.1  91.4 - 78.2 (%)    Platelets 209  150 - 400 (K/uL)   GLUCOSE, CAPILLARY     Status: Abnormal   Collection Time   06/08/11  7:13 AM      Component Value Range Comment   Glucose-Capillary 206 (*) 70 - 99 (mg/dL)   GLUCOSE, CAPILLARY     Status: Abnormal   Collection Time   06/08/11 11:30 AM      Component Value Range Comment   Glucose-Capillary 131 (*) 70 - 99 (mg/dL)   GLUCOSE, CAPILLARY     Status: Abnormal   Collection Time   06/08/11  4:39 PM      Component Value Range Comment   Glucose-Capillary 232 (*) 70 - 99 (mg/dL)   GLUCOSE, CAPILLARY     Status: Abnormal   Collection Time   06/08/11 10:49 PM      Component Value Range Comment   Glucose-Capillary 203 (*) 70 - 99 (  mg/dL)   BASIC METABOLIC PANEL     Status: Abnormal   Collection Time   06/09/11  5:50 AM      Component Value Range Comment   Sodium 133 (*) 135 - 145 (mEq/L)    Potassium 3.8  3.5 - 5.1 (mEq/L)    Chloride 96  96 - 112 (mEq/L)    CO2 26  19 - 32 (mEq/L)    Glucose, Bld 186 (*) 70 - 99 (mg/dL)    BUN 8  6 - 23 (mg/dL)    Creatinine, Ser 1.61  0.50 - 1.10 (mg/dL)    Calcium 8.5  8.4 - 10.5 (mg/dL)    GFR calc non Af Amer >90  >90 (mL/min)    GFR calc Af Amer >90  >90 (mL/min)   MAGNESIUM     Status: Normal   Collection Time   06/09/11  5:50 AM       Component Value Range Comment   Magnesium 1.8  1.5 - 2.5 (mg/dL)   GLUCOSE, CAPILLARY     Status: Abnormal   Collection Time   06/09/11  6:29 AM      Component Value Range Comment   Glucose-Capillary 207 (*) 70 - 99 (mg/dL)   GLUCOSE, CAPILLARY     Status: Abnormal   Collection Time   06/09/11 11:35 AM      Component Value Range Comment   Glucose-Capillary 177 (*) 70 - 99 (mg/dL)   GLUCOSE, CAPILLARY     Status: Abnormal   Collection Time   06/09/11  4:47 PM      Component Value Range Comment   Glucose-Capillary 186 (*) 70 - 99 (mg/dL)   GLUCOSE, CAPILLARY     Status: Abnormal   Collection Time   06/09/11  9:04 PM      Component Value Range Comment   Glucose-Capillary 176 (*) 70 - 99 (mg/dL)    Comment 1 Documented in Chart      Comment 2 Notify RN       No results found.  Review of Systems  Eyes: Negative.  Negative for blurred vision, double vision and photophobia.  Respiratory: Negative.   Gastrointestinal: Negative.   Genitourinary: Negative.   Musculoskeletal: Positive for back pain.  Skin: Negative.   Neurological: Positive for weakness and headaches.  Endo/Heme/Allergies: Negative.   Psychiatric/Behavioral: Negative.    Blood pressure 159/76, pulse 100, temperature 99 F (37.2 C), temperature source Oral, resp. rate 18, height 4\' 11"  (1.499 m), weight 63.7 kg (140 lb 6.9 oz), SpO2 92.00%. Physical Exam  Constitutional: She is oriented to person, place, and time. She appears well-developed and well-nourished.  Eyes: Conjunctivae and EOM are normal. Pupils are equal, round, and reactive to light.  Neck: Normal range of motion. Neck supple.  Cardiovascular: Normal rate and regular rhythm.   Respiratory: Effort normal and breath sounds normal.  GI: Soft. Bowel sounds are normal.  Musculoskeletal: Normal range of motion. She exhibits tenderness.  Neurological: She is alert and oriented to person, place, and time. She has normal reflexes.       Motor function normal in  lower extremities.  Skin: Skin is dry.  Psychiatric: She has a normal mood and affect. Her behavior is normal.    Assessment/Plan: Back pain with L2 compression fracture.  Mobilize with PT. If pain persists inbeing substantial patient may be candidate for Acrylic Kyphoplasty.  Nolita Kutter J 06/10/2011, 1:49 AM

## 2011-06-11 ENCOUNTER — Inpatient Hospital Stay (HOSPITAL_COMMUNITY): Payer: Medicare Other

## 2011-06-11 LAB — GLUCOSE, CAPILLARY
Glucose-Capillary: 231 mg/dL — ABNORMAL HIGH (ref 70–99)
Glucose-Capillary: 198 mg/dL — ABNORMAL HIGH (ref 70–99)

## 2011-06-11 LAB — CARDIAC PANEL(CRET KIN+CKTOT+MB+TROPI): Troponin I: <0.3 ng/mL (ref <0.30)

## 2011-06-11 MED ORDER — ALBUTEROL SULFATE (5 MG/ML) 0.5% IN NEBU: 2.5000 mg | INHALATION_SOLUTION | Freq: Four times a day (QID) | RESPIRATORY_TRACT | Status: DC

## 2011-06-11 MED ORDER — LEVOFLOXACIN IN D5W 500 MG/100ML IV SOLN
500.0000 mg | INTRAVENOUS | Status: DC
  Administered 2011-06-11 – 2011-06-15 (×5): 500 mg via INTRAVENOUS
  Filled 2011-06-11 (×6): qty 100

## 2011-06-11 MED ORDER — VANCOMYCIN HCL IN DEXTROSE 1-5 GM/200ML-% IV SOLN
1000.0000 mg | INTRAVENOUS | Status: AC
  Administered 2011-06-15: 1000 mg via INTRAVENOUS
  Filled 2011-06-11: qty 200

## 2011-06-11 MED ORDER — ALBUTEROL SULFATE (5 MG/ML) 0.5% IN NEBU
2.5000 mg | INHALATION_SOLUTION | RESPIRATORY_TRACT | Status: DC | PRN
  Filled 2011-06-11: qty 0.5

## 2011-06-11 NOTE — Evaluation (Signed)
Physical Therapy Evaluation Patient Details Name: Mary Flynn MRN: [id number] DOB: [birth year] Today's Date: 06/11/2011  Problem List:  Patient Active Problem List  Diagnoses  . FIBROIDS, UTERUS  . THYROID NODULE, RIGHT  . DM w/o Complication Type II  . DIABETIC PERIPHERAL NEUROPATHY  . HYPERLIPIDEMIA  . HYPERTENSION  . GERD  . CONSTIPATION  . UTI  . Acute low back pain due to trauma  . OSTEOPOROSIS NOS  . MEMORY LOSS  . Chronic bronchitis NEC  . ABDOMINAL BLOATING  . SPRAIN&STRAIN OTHER SPECIFIED SITES KNEE&LEG  . LACERATION OF FINGER  . URINARY INCONTINENCE, STRESS, FEMALE  . PPD positive  . Chronic insomnia  . Compression fracture of L2 lumbar vertebra    Past Medical History:  Past Medical History  Diagnosis Date  . Diabetes mellitus   . Hyperlipidemia   . Hypertension    Past Surgical History:  Past Surgical History  Procedure Date  . Appendectomy   . Surgical sterilization 40 years ago  . Refractive surgery     Left Eye    PT Assessment/Plan/Recommendation PT Assessment Clinical Impression Statement: Pt presents with a L2 compression fx schedule for a kyphoplasty tomorrow 4/19. Will continue assessment s/p surgery. Pt with decreased balance and functional mobility secondary to pain. Pt will benefit from skilled PT in the acute care setting in order to maximize functional mobility and safety prior to d/c PT Recommendation/Assessment: Patient will need skilled PT in the acute care venue PT Problem List: Decreased activity tolerance;Decreased balance;Decreased mobility;Decreased knowledge of use of DME;Decreased safety awareness;Decreased knowledge of precautions;Pain PT Therapy Diagnosis : Difficulty walking;Acute pain PT Plan PT Frequency: Min 5X/week PT Treatment/Interventions: DME instruction;Gait training;Stair training;Functional mobility training;Therapeutic activities;Therapeutic exercise;Balance training;Patient/family education PT Recommendation Follow  Up Recommendations: Home health PT;Supervision/Assistance - 24 hour Equipment Recommended: Rolling walker with 5" wheels PT Goals  Acute Rehab PT Goals PT Goal Formulation: With patient/family Time For Goal Achievement: 7 days Pt will go Supine/Side to Sit: with modified independence PT Goal: Supine/Side to Sit - Progress: Goal set today Pt will go Sit to Supine/Side: with modified independence PT Goal: Sit to Supine/Side - Progress: Goal set today Pt will go Sit to Stand: with modified independence PT Goal: Sit to Stand - Progress: Goal set today Pt will go Stand to Sit: with modified independence PT Goal: Stand to Sit - Progress: Goal set today Pt will Transfer Bed to Chair/Chair to Bed: with modified independence PT Transfer Goal: Bed to Chair/Chair to Bed - Progress: Goal set today Pt will Ambulate: >150 feet;with modified independence;with least restrictive assistive device PT Goal: Ambulate - Progress: Goal set today Pt will Go Up / Down Stairs: 1-2 stairs;with min assist PT Goal: Up/Down Stairs - Progress: Goal set today  PT Evaluation Precautions/Restrictions  Precautions Precautions: None Restrictions Weight Bearing Restrictions: No Prior Functioning  Home Living Lives With: Spouse Available Help at Discharge: Family Type of Home: Apartment Home Access: Stairs to enter Secretary/administrator of Steps: 1 Entrance Stairs-Rails: None Home Layout: One level Bathroom Shower/Tub: Forensic scientist: Standard Bathroom Accessibility: Yes How Accessible: Accessible via walker Home Adaptive Equipment: None Prior Function Level of Independence: Independent Able to Take Stairs?: Yes Driving: No Vocation: Retired Financial risk analyst Overall Cognitive Status: Appears within functional limits for tasks assessed/performed Arousal/Alertness: Awake/alert Orientation Level: Oriented X4 / Intact Behavior During Session: [facility] for tasks  performed Sensation/Coordination Sensation Light Touch: Appears Intact Coordination Gross Motor Movements are Fluid and Coordinated: Yes Extremity Assessment RLE Assessment RLE  Assessment: Within Functional Limits LLE Assessment LLE Assessment: Within Functional Limits Mobility (including Balance) Bed Mobility Bed Mobility: Yes Rolling Right: 4: Min assist;With rail Rolling Right Details (indicate cue type and reason): VC for sequencing to ease pain during transfer Right Sidelying to Sit: 4: Min assist Right Sidelying to Sit Details (indicate cue type and reason): Min assist through trunk secondary to pain with movement. VC for sequencing Sitting - Scoot to Edge of Bed: 6: Modified independent (Device/Increase time) Transfers Transfers: Yes Sit to Stand: 4: Min assist;With upper extremity assist;From bed Sit to Stand Details (indicate cue type and reason): Min assist for stability upon standing. VC for hand placement Stand to Sit: 4: Min assist;With upper extremity assist;To bed Stand to Sit Details: Min assist for controlled descent. VC for hand placement Ambulation/Gait Ambulation/Gait: Yes Ambulation/Gait Assistance: 4: Min assist Ambulation/Gait Assistance Details (indicate cue type and reason): Min assist for stability with ambulation secondary to balance loss with no assistance. VC for sequencing throughout (Multiple rest breaks due to pain) Ambulation Distance (Feet): 75 Feet Assistive device: 1 person hand held assist Gait Pattern: Step-to pattern;Decreased stride length Gait velocity: decreased gait speed Stairs: No  Balance Balance Assessed: Yes Dynamic Standing Balance Dynamic Standing - Balance Support: Right upper extremity supported Dynamic Standing - Level of Assistance: 4: Min assist Dynamic Standing - Balance Activities: Other (comment) (during ambulation) Dynamic Standing - Comments: with no hand held assist, pt with loss of balance requiring min assist to  stabilize. Recommend RW next session Exercise    End of Session PT - End of Session Equipment Utilized During Treatment: Gait belt Activity Tolerance: Patient tolerated treatment well Patient left: in bed;with call bell in reach Nurse Communication: Mobility status for transfers;Mobility status for ambulation General Behavior During Session: [facility] for tasks performed Cognition: [facility] for tasks performed  Milana Kidney 06/11/2011, 3:53 PM  06/11/2011 Milana Kidney DPT PAGER: [phone] OFFICE: [phone]

## 2011-06-11 NOTE — Progress Notes (Addendum)
Subjective: Still having back pain with movement C/o constipation- says the prunes she is eating will help Back pain worsened with cough   Objective: Vital signs in last 24 hours: Filed Vitals:   06/10/11 0600 06/10/11 1435 06/10/11 2150 06/11/11 0600  BP: 135/80 119/70 149/79 133/78  Pulse: 108 112 107 103  Temp: 97.6 F (36.4 C) 98.4 F (36.9 C) 98.3 F (36.8 C) 98.9 F (37.2 C)  TempSrc: Oral Oral    Resp: 18 18 18 18   Height:      Weight: 62.5 kg (137 lb 12.6 oz)   66 kg (145 lb 8.1 oz)  SpO2: 95% 92% 94% 95%   Weight change: 3.5 kg (7 lb 11.5 oz) No intake or output data in the 24 hours ending 06/11/11 0848  Physical Exam: General: Awake, Oriented, No acute distress. HEENT: EOMI. Neck: Supple CV: S1 and S2, rrr Lungs: Clear to ascultation bilaterally, no wheezing Abdomen: Soft, Nontender, Nondistended, +bowel sounds. Ext: Good pulses. No edema.   Lab Results:  [facility] 06/09/11 0550  NA 133*  K 3.8  CL 96  CO2 26  GLUCOSE 186*  BUN 8  CREATININE 0.50  CALCIUM 8.5  MG 1.8  PHOS --   No results found for this basename: AST:2,ALT:2,ALKPHOS:2,BILITOT:2,PROT:2,ALBUMIN:2 in the last 72 hours No results found for this basename: LIPASE:2,AMYLASE:2 in the last 72 hours No results found for this basename: WBC:2,NEUTROABS:2,HGB:2,HCT:2,MCV:2,PLT:2 in the last 72 hours No results found for this basename: CKTOTAL:3,CKMB:3,CKMBINDEX:3,TROPONINI:3 in the last 72 hours No components found with this basename: POCBNP:3 No results found for this basename: DDIMER:2 in the last 72 hours No results found for this basename: HGBA1C:2 in the last 72 hours No results found for this basename: CHOL:2,HDL:2,LDLCALC:2,TRIG:2,CHOLHDL:2,LDLDIRECT:2 in the last 72 hours No results found for this basename: TSH,T4TOTAL,FREET3,T3FREE,THYROIDAB in the last 72 hours No results found for this basename: VITAMINB12:2,FOLATE:2,FERRITIN:2,TIBC:2,IRON:2,RETICCTPCT:2 in the last 72 hours  Micro  Results: No results found for this or any previous visit (from the past 240 hour(s)).  Studies/Results: No results found.  Medications: I have reviewed the patient's current medications. Scheduled Meds:    . amLODipine  2.5 mg Oral Daily  . aspirin EC  81 mg Oral Daily  . atorvastatin  10 mg Oral Daily  . docusate sodium  100 mg Oral BID  . enoxaparin  40 mg Subcutaneous Q24H  . FLUoxetine  10 mg Oral Daily  . gabapentin  100 mg Oral BID  . glimepiride  2 mg Oral Daily  . insulin aspart  0-15 Units Subcutaneous TID WC  . insulin aspart  0-5 Units Subcutaneous QHS  . insulin glargine  18 Units Subcutaneous QHS  . linagliptin  5 mg Oral Daily  . loratadine  10 mg Oral Daily  . niacin  1,000 mg Oral QHS  . olmesartan  40 mg Oral Daily  . omega-3 acid ethyl esters  2 g Oral BID  . pantoprazole  40 mg Oral Q1200  . polyethylene glycol  17 g Oral BID  . senna  1 tablet Oral Daily  . DISCONTD: insulin glargine  15 Units Subcutaneous QHS   Continuous Infusions:  PRN Meds:.acetaminophen, bisacodyl, HYDROmorphone, ondansetron (ZOFRAN) IV, ondansetron, oxyCODONE, zolpidem, DISCONTD: HYDROmorphone  Assessment/Plan: Compression fracture of L2 lumbar vertebra (06/08/2011)  Acute low back pain due to trauma (12/20/2008)  Neurosurgery to do kyphoplasty on Friday PT after  DM w/o Complication Type II (08/05/2006) Hold metformin. Continue Amaryl, Lantus, linagliptin- increase lantus  HYPERLIPIDEMIA (08/05/2006) Continue with Lipitor.  HYPERTENSION (08/05/2006) Continue with benicar, norvasc.   OSTEOPOROSIS NOS (08/05/2006)  PNA- add nebs and abx to see if improvement, x ray shows worsening scarring/atelectasia- add incentive spirometry  Acute respiratory failure- requiring O2, secondary to PNA- if not improving will consult patient's pulmonologist      LOS: 4 days  Mary Latella, DO 06/11/2011, 8:48 AM

## 2011-06-11 NOTE — Progress Notes (Signed)
Pt. with difficulty maintaining sats; now 90% 4L; pulse 100-110s. Pt. also c/o CP on movement. Dr. Benjamine Mola notified; new orders received. Will monitor.  Mitsuo Budnick, Hassell Done

## 2011-06-12 ENCOUNTER — Encounter (HOSPITAL_COMMUNITY)
Admission: EM | Disposition: A | Discharge status: Skilled Nursing Facility | Payer: Self-pay | Source: Home / Self Care | Attending: Internal Medicine

## 2011-06-12 LAB — CBC
HCT: 33.1 % — ABNORMAL LOW (ref 36.0–46.0)
Hemoglobin: 11.2 g/dL — ABNORMAL LOW (ref 12.0–15.0)
MCV: 81.5 fL (ref 78.0–100.0)
RBC: 4.06 MIL/uL (ref 3.87–5.11)
RDW: 14 % (ref 11.5–15.5)
WBC: 6.1 10*3/uL (ref 4.0–10.5)

## 2011-06-12 LAB — AFB CULTURE WITH SMEAR (NOT AT ARMC): Acid Fast Smear: NONE SEEN

## 2011-06-12 LAB — GLUCOSE, CAPILLARY
Glucose-Capillary: 185 mg/dL — ABNORMAL HIGH (ref 70–99)
Glucose-Capillary: 285 mg/dL — ABNORMAL HIGH (ref 70–99)

## 2011-06-12 LAB — BASIC METABOLIC PANEL
BUN: 7 mg/dL (ref 6–23)
CO2: 27 mEq/L (ref 19–32)
Chloride: 97 mEq/L (ref 96–112)
Creatinine, Ser: 0.52 mg/dL (ref 0.50–1.10)
Glucose, Bld: 175 mg/dL — ABNORMAL HIGH (ref 70–99)

## 2011-06-12 SURGERY — KYPHOPLASTY
Anesthesia: General

## 2011-06-12 NOTE — Progress Notes (Signed)
Physical Therapy Treatment Patient Details Name: Anahit Klumb MRN: [id number] DOB: 1935/02/11 Today's Date: 06/12/2011 Time: 4098-1191 PT Time Calculation (min): 19 min  PT Assessment / Plan / Recommendation Comments on Treatment Session  Pt progressing well. Ambulation distance shortned due to pt with feeling of chest constriction. Pt described symptoms as regular at home but have occured more since being in hospital. O2 sats checked and normal. RN in room and aware. Will communicate with MD.    Follow Up Recommendations  Home health PT;Supervision/Assistance - 24 hour    Equipment Recommendations  Rolling walker with 5" wheels    Frequency Min 5X/week   Plan Discharge plan remains appropriate;Frequency remains appropriate    Precautions / Restrictions Precautions Precautions: None Restrictions Weight Bearing Restrictions: No   Pertinent Vitals/Pain O2 sats 94 following ambulation on RA    Mobility  Bed Mobility Rolling Right: 4: Min assist;With rail Right Sidelying to Sit: 4: Min assist Sitting - Scoot to Edge of Bed: 6: Modified independent (Device/Increase time) Sit to Supine: 4: Min assist Details for Bed Mobility Assistance: Min assist for trunk control. Assist with LEs from sitting into right sidelying to get into supine. VC for sequencing and safety throughout as well as proper sequencing for comfort on back during transfers Transfers Transfers: Sit to Stand;Stand to Sit Sit to Stand: 5: Supervision Stand to Sit: 5: Supervision Details for Transfer Assistance: VC for hand placement for safety from RW Ambulation/Gait Ambulation/Gait Assistance: 5: Supervision Ambulation Distance (Feet): 75 Feet Assistive device: Rolling walker Ambulation/Gait Assistance Details: Supervision during ambulation with RW for safety. VC for sequencing and safe distance to RW Gait Pattern: Step-to pattern;Decreased stride length Gait velocity: decreased gait speed    Exercises     PT  Goals Acute Rehab PT Goals PT Goal Formulation: With patient/family Time For Goal Achievement: 06/18/11 Potential to Achieve Goals: Good PT Goal: Supine/Side to Sit - Progress: Progressing toward goal PT Goal: Sit to Supine/Side - Progress: Progressing toward goal PT Goal: Sit to Stand - Progress: Progressing toward goal PT Goal: Stand to Sit - Progress: Progressing toward goal PT Transfer Goal: Bed to Chair/Chair to Bed - Progress: Progressing toward goal PT Goal: Ambulate - Progress: Progressing toward goal  Visit Information  Last PT Received On: 06/12/11    Subjective Data      Cognition  Overall Cognitive Status: Appears within functional limits for tasks assessed/performed Arousal/Alertness: Awake/alert Orientation Level: Oriented X4 / Intact Behavior During Session: [facility] for tasks performed    Balance     End of Session PT - End of Session Equipment Utilized During Treatment: Gait belt Activity Tolerance: Patient tolerated treatment well Patient left: in bed;with call bell/phone within reach;with family/visitor present;with nursing in room Nurse Communication: Mobility status;Other (comment) (pt with feeling of constriction)    Milana Kidney 06/12/2011, 6:01 PM  06/12/2011 Milana Kidney DPT PAGER: [phone] OFFICE: [phone]

## 2011-06-12 NOTE — Progress Notes (Signed)
Patient's family (Husband & Daughter) called to postpone her surgery and to re-instate her vegan diet.  Patient's family felt uncomfortable her undergoing surgery while recovering from Pneumonia.  Nursing relayed message to Dr. Danielle Dess upon receiving message from family.  Scarlette Shorts

## 2011-06-12 NOTE — Progress Notes (Signed)
Clinical Social Work Department BRIEF PSYCHOSOCIAL ASSESSMENT 06/12/2011  Patient:  Mary Flynn, Mary Flynn     Account Number:  [id number]     Admit date:  06/07/2011  Clinical Social Worker:  Jacelyn Grip  Date/Time:  06/12/2011 04:00 PM  Referred by:  Physician  Date Referred:  06/12/2011 Referred for  SNF Placement   Other Referral:   Interview type:  Patient Other interview type:   and pt husband and pt daughter at bedside    PSYCHOSOCIAL DATA Living Status:  HUSBAND Admitted from facility:   Level of care:   Primary support name:  Niese,Srikanta/[phone] Primary support relationship to patient:  SPOUSE Degree of support available:   strong    CURRENT CONCERNS Current Concerns  Post-Acute Placement   Other Concerns:    SOCIAL WORK ASSESSMENT / PLAN CSW met with pt and pt husband and pt daughter to discuss pt discharge needs. Pt discussed that she currently lives at home with pt husband who is able to provide 24 hour supervision for pt, but states that pt husband will not be able to provide care for her at home as he is unsure how to. CSW discussed the option of home health services and the services that home health is able to provide. CSW discussed the possiblity of short term rehab at SNF if pt husband unable to care for pt 24 hour a day. Pt reports that she is scheduled for surgery on Tuesday. CSW discussed that PT will work with pt after surgery and pt needs can be more adequately identified at this time. CSW will follow up after pt surgery to reassess pt discharge needs and initiate SNF search at that time if appropriate.   Assessment/plan status:  Psychosocial Support/Ongoing Assessment of Needs Other assessment/ plan:   discharge planning   Information/referral to community resources:   [facility] list as resource for family    PATIENT'S/FAMILY'S RESPONSE TO PLAN OF CARE: Pt alert and oriented and pleasant. Pt is concerned about her ADL's if she  returns home and eager to learn about options for discharge planning. Pt and pt family agreeable to follow up from CSW following surgery to reassess pt discharge needs and determine if SNF placement is appropriate.     Jacklynn Lewis, MSW, LCSWA  Clinical Social Work [phone]

## 2011-06-12 NOTE — Progress Notes (Addendum)
Subjective: Breathing better Had BM when ate prunes Says she has no one at home to help her   Objective: Vital signs in last 24 hours: Filed Vitals:   06/11/11 0932 06/11/11 1505 06/11/11 2200 06/12/11 0600  BP: 144/78 118/72 133/78 138/77  Pulse: 108 106 94 82  Temp: 98.5 F (36.9 C) 98.1 F (36.7 C) 98 F (36.7 C) 98.3 F (36.8 C)  TempSrc: Oral Oral    Resp:  18 16 16   Height:      Weight:      SpO2: 93% 94% 92% 95%   Weight change:   Intake/Output Summary (Last 24 hours) at 06/12/11 0851 Last data filed at 06/11/11 1757  Gross per 24 hour  Intake    440 ml  Output      0 ml  Net    440 ml    Physical Exam: General: Awake, Oriented, No acute distress. HEENT: EOMI. Neck: Supple CV: S1 and S2, rrr Lungs: Clear to ascultation bilaterally, no wheezing Abdomen: Soft, Nontender, Nondistended, +bowel sounds. Ext: Good pulses. No edema.   Lab Results:  [facility] 06/12/11 0652  NA 134*  K 3.9  CL 97  CO2 27  GLUCOSE 175*  BUN 7  CREATININE 0.52  CALCIUM 8.9  MG --  PHOS --   No results found for this basename: AST:2,ALT:2,ALKPHOS:2,BILITOT:2,PROT:2,ALBUMIN:2 in the last 72 hours No results found for this basename: LIPASE:2,AMYLASE:2 in the last 72 hours  Basename 06/12/11 0652  WBC 6.1  NEUTROABS --  HGB 11.2*  HCT 33.1*  MCV 81.5  PLT 223    Basename 06/11/11 1221  CKTOTAL 44  CKMB 1.8  CKMBINDEX --  TROPONINI <0.30   No components found with this basename: POCBNP:3 No results found for this basename: DDIMER:2 in the last 72 hours No results found for this basename: HGBA1C:2 in the last 72 hours No results found for this basename: CHOL:2,HDL:2,LDLCALC:2,TRIG:2,CHOLHDL:2,LDLDIRECT:2 in the last 72 hours No results found for this basename: TSH,T4TOTAL,FREET3,T3FREE,THYROIDAB in the last 72 hours No results found for this basename: VITAMINB12:2,FOLATE:2,FERRITIN:2,TIBC:2,IRON:2,RETICCTPCT:2 in the last 72 hours  Micro Results: No results found  for this or any previous visit (from the past 240 hour(s)).  Studies/Results: Dg Chest 2 View  06/11/2011  *RADIOLOGY REPORT*  Clinical Data: Right-sided pain, shortness of breath  CHEST - 2 VIEW  Comparison: 06/07/2011  Findings: Cardiomediastinal silhouette is stable.  There is worsening bilateral scarring or atelectasis.  There is probable superimposed airspace disease in the right base suspicious for infiltrate.  Follow-up to resolution after treatment is recommended.  IMPRESSION:  There is worsening bilateral scarring or atelectasis.  There is probable superimposed airspace disease in the right base suspicious for infiltrate.  Follow-up to resolution after treatment is recommended.  Original Report Authenticated By: Natasha Mead, M.D.    Medications: I have reviewed the patient's current medications. Scheduled Meds:    . amLODipine  2.5 mg Oral Daily  . aspirin EC  81 mg Oral Daily  . atorvastatin  10 mg Oral Daily  . docusate sodium  100 mg Oral BID  . enoxaparin  40 mg Subcutaneous Q24H  . FLUoxetine  10 mg Oral Daily  . gabapentin  100 mg Oral BID  . glimepiride  2 mg Oral Daily  . insulin aspart  0-15 Units Subcutaneous TID WC  . insulin aspart  0-5 Units Subcutaneous QHS  . insulin glargine  18 Units Subcutaneous QHS  . levofloxacin (LEVAQUIN) IV  500 mg Intravenous Q24H  .  linagliptin  5 mg Oral Daily  . loratadine  10 mg Oral Daily  . niacin  1,000 mg Oral QHS  . olmesartan  40 mg Oral Daily  . omega-3 acid ethyl esters  2 g Oral BID  . pantoprazole  40 mg Oral Q1200  . polyethylene glycol  17 g Oral BID  . senna  1 tablet Oral Daily  . vancomycin  1,000 mg Intravenous 120 min pre-op  . DISCONTD: albuterol  2.5 mg Nebulization Q6H   Continuous Infusions:  PRN Meds:.acetaminophen, albuterol, bisacodyl, HYDROmorphone, ondansetron (ZOFRAN) IV, ondansetron, oxyCODONE, zolpidem  Assessment/Plan: Compression fracture of L2 lumbar vertebra (06/08/2011)  Acute low back pain due  to trauma (12/20/2008)  Neurosurgery to do kyphoplasty today PT after- recommending 24 hour care, not able to provide at home, may need SNF for a bit  DM w/o Complication Type II (08/05/2006) Hold metformin. Continue Amaryl, Lantus, linagliptin- increase lantus (hold meds today for surgery)  HYPERLIPIDEMIA (08/05/2006) Continue with Lipitor.   HYPERTENSION (08/05/2006) Continue with benicar, norvasc.   OSTEOPOROSIS NOS (08/05/2006)  ? PNA- infiltrate with increased cough and sputum, requiring O2, add nebs and abx to see if improvement, x ray shows worsening scarring/atelectasia- add incentive spirometry  Acute respiratory failure- requiring O2, secondary to PNA- improving  Says she has no help at home Surgery on Tuesday-plan to dispo after that SNF vs home health    LOS: 5 days  Mary Rosario, DO 06/12/2011, 8:51 AM

## 2011-06-12 NOTE — Progress Notes (Signed)
Patient ID: Mary Flynn, female   DOB: 03-18-1934, 77 y.o.   MRN: [id number] Plan to drill a kyphoplasty was canceled today secondary to anesthesia's concerns and family's concerns about patient having pneumonia. Clinically patient has been stable, respiratory status has been stable. Will reschedule kyphoplasty for Monday afternoon. Please have patient be n.p.o. after midnight on Sunday.

## 2011-06-12 NOTE — Progress Notes (Signed)
PT Cancellation Note  Treatment cancelled today due to patient receiving procedure or test. Pt going down for kyphoplasty today. Will resume treatment tomorrow.  Thanks, 06/12/2011 Milana Kidney DPT PAGER: [phone] OFFICE: [phone]    Milana Kidney 06/12/2011, 7:43 AM

## 2011-06-13 LAB — BASIC METABOLIC PANEL
BUN: 10 mg/dL (ref 6–23)
Chloride: 95 mEq/L — ABNORMAL LOW (ref 96–112)
GFR calc Af Amer: >90 mL/min (ref >90)
Glucose, Bld: 255 mg/dL — ABNORMAL HIGH (ref 70–99)
Potassium: 4.2 mEq/L (ref 3.5–5.1)
Sodium: 130 mEq/L — ABNORMAL LOW (ref 135–145)

## 2011-06-13 LAB — CBC
HCT: 34 % — ABNORMAL LOW (ref 36.0–46.0)
Hemoglobin: 11.8 g/dL — ABNORMAL LOW (ref 12.0–15.0)
RBC: 4.19 MIL/uL (ref 3.87–5.11)
WBC: 5.7 10*3/uL (ref 4.0–10.5)

## 2011-06-13 LAB — GLUCOSE, CAPILLARY
Glucose-Capillary: 207 mg/dL — ABNORMAL HIGH (ref 70–99)
Glucose-Capillary: 225 mg/dL — ABNORMAL HIGH (ref 70–99)
Glucose-Capillary: 252 mg/dL — ABNORMAL HIGH (ref 70–99)

## 2011-06-13 MED ORDER — BENZONATATE 100 MG PO CAPS
100.0000 mg | ORAL_CAPSULE | Freq: Two times a day (BID) | ORAL | Status: DC
  Administered 2011-06-13 – 2011-06-18 (×11): 100 mg via ORAL
  Filled 2011-06-13 (×13): qty 1

## 2011-06-13 NOTE — Progress Notes (Signed)
Subjective: Now says she has trouble swallowing Appears to be long standing issue she has been seen at [facility] for along with cough and an extensive work up for cough   Objective: Vital signs in last 24 hours: Filed Vitals:   06/12/11 1400 06/12/11 2223 06/12/11 2233 06/13/11 0641  BP: 147/72 151/78  145/54  Pulse: 105 104  93  Temp: 98.9 F (37.2 C) 98.7 F (37.1 C)  98 F (36.7 C)  TempSrc: Oral Oral  Oral  Resp: 18 16  18   Height:      Weight:    63.9 kg (140 lb 14 oz)  SpO2: 96% 83% 97% 93%   Weight change:   Intake/Output Summary (Last 24 hours) at 06/13/11 0856 Last data filed at 06/12/11 1351  Gross per 24 hour  Intake    200 ml  Output      0 ml  Net    200 ml    Physical Exam: General: Awake, Oriented, No acute distress. HEENT: EOMI. Neck: Supple CV: S1 and S2, rrr Lungs: Clear to ascultation bilaterally, no wheezing Abdomen: Soft, Nontender, Nondistended, +bowel sounds. Ext: Good pulses. No edema.   Lab Results:  Basename 06/13/11 0620 06/12/11 0652  NA 130* 134*  K 4.2 3.9  CL 95* 97  CO2 25 27  GLUCOSE 255* 175*  BUN 10 7  CREATININE 0.55 0.52  CALCIUM 9.1 8.9  MG -- --  PHOS -- --   No results found for this basename: AST:2,ALT:2,ALKPHOS:2,BILITOT:2,PROT:2,ALBUMIN:2 in the last 72 hours No results found for this basename: LIPASE:2,AMYLASE:2 in the last 72 hours  Basename 06/13/11 0620 06/12/11 0652  WBC 5.7 6.1  NEUTROABS -- --  HGB 11.8* 11.2*  HCT 34.0* 33.1*  MCV 81.1 81.5  PLT 233 223    Basename 06/11/11 1221  CKTOTAL 44  CKMB 1.8  CKMBINDEX --  TROPONINI <0.30   No components found with this basename: POCBNP:3 No results found for this basename: DDIMER:2 in the last 72 hours No results found for this basename: HGBA1C:2 in the last 72 hours No results found for this basename: CHOL:2,HDL:2,LDLCALC:2,TRIG:2,CHOLHDL:2,LDLDIRECT:2 in the last 72 hours No results found for this basename: TSH,T4TOTAL,FREET3,T3FREE,THYROIDAB in the  last 72 hours No results found for this basename: VITAMINB12:2,FOLATE:2,FERRITIN:2,TIBC:2,IRON:2,RETICCTPCT:2 in the last 72 hours  Micro Results: No results found for this or any previous visit (from the past 240 hour(s)).  Studies/Results: Dg Chest 2 View  06/11/2011  *RADIOLOGY REPORT*  Clinical Data: Right-sided pain, shortness of breath  CHEST - 2 VIEW  Comparison: 06/07/2011  Findings: Cardiomediastinal silhouette is stable.  There is worsening bilateral scarring or atelectasis.  There is probable superimposed airspace disease in the right base suspicious for infiltrate.  Follow-up to resolution after treatment is recommended.  IMPRESSION:  There is worsening bilateral scarring or atelectasis.  There is probable superimposed airspace disease in the right base suspicious for infiltrate.  Follow-up to resolution after treatment is recommended.  Original Report Authenticated By: Natasha Mead, M.D.    Medications: I have reviewed the patient's current medications. Scheduled Meds:    . amLODipine  2.5 mg Oral Daily  . aspirin EC  81 mg Oral Daily  . atorvastatin  10 mg Oral Daily  . docusate sodium  100 mg Oral BID  . enoxaparin  40 mg Subcutaneous Q24H  . FLUoxetine  10 mg Oral Daily  . gabapentin  100 mg Oral BID  . glimepiride  2 mg Oral Daily  . insulin aspart  0-15  Units Subcutaneous TID WC  . insulin aspart  0-5 Units Subcutaneous QHS  . insulin glargine  18 Units Subcutaneous QHS  . levofloxacin (LEVAQUIN) IV  500 mg Intravenous Q24H  . linagliptin  5 mg Oral Daily  . loratadine  10 mg Oral Daily  . niacin  1,000 mg Oral QHS  . olmesartan  40 mg Oral Daily  . omega-3 acid ethyl esters  2 g Oral BID  . pantoprazole  40 mg Oral Q1200  . polyethylene glycol  17 g Oral BID  . senna  1 tablet Oral Daily  . vancomycin  1,000 mg Intravenous 120 min pre-op   Continuous Infusions:  PRN Meds:.acetaminophen, albuterol, bisacodyl, HYDROmorphone, ondansetron (ZOFRAN) IV, ondansetron,  oxyCODONE, zolpidem  Assessment/Plan: Compression fracture of L2 lumbar vertebra (06/08/2011)  Acute low back pain due to trauma (12/20/2008)  Neurosurgery to do kyphoplasty Monday PT after- recommending 24 hour care, not able to provide at home, may need SNF for a bit  DM w/o Complication Type II (08/05/2006) Hold metformin. Continue Amaryl, Lantus, linagliptin- increase lantus (hold meds today for surgery)  HYPERLIPIDEMIA (08/05/2006) Continue with Lipitor.   HYPERTENSION (08/05/2006) Continue with benicar, norvasc.   OSTEOPOROSIS NOS (08/05/2006)  ? PNA- infiltrate with increased cough and sputum, requiring O2, add nebs and abx to see if improvement, x ray shows worsening scarring/atelectasia- add incentive spirometry  Acute respiratory failure- requiring O2, secondary to PNA- improving  ?Dysphagia- reports that she gags when she eats, does not get stuck in esophagus as on would think with stricture, problem seems higher up in back of throat, will ask speech to eval   Says she has no help at home Surgery on Monday-plan to dispo after that SNF vs home health    LOS: 6 days  Deoni Cosey, DO 06/13/2011, 8:56 AM

## 2011-06-14 LAB — GLUCOSE, CAPILLARY
Glucose-Capillary: 205 mg/dL — ABNORMAL HIGH (ref 70–99)
Glucose-Capillary: 161 mg/dL — ABNORMAL HIGH (ref 70–99)

## 2011-06-14 LAB — CBC
Hemoglobin: 12.3 g/dL (ref 12.0–15.0)
MCH: 28.3 pg (ref 26.0–34.0)
MCV: 81.8 fL (ref 78.0–100.0)
RBC: 4.35 MIL/uL (ref 3.87–5.11)
WBC: 6.6 10*3/uL (ref 4.0–10.5)

## 2011-06-14 LAB — BASIC METABOLIC PANEL
CO2: 25 mEq/L (ref 19–32)
GFR calc non Af Amer: 88 mL/min — ABNORMAL LOW (ref >90)
Glucose, Bld: 312 mg/dL — ABNORMAL HIGH (ref 70–99)
Potassium: 4.3 mEq/L (ref 3.5–5.1)
Sodium: 131 mEq/L — ABNORMAL LOW (ref 135–145)

## 2011-06-14 MED ORDER — FENTANYL CITRATE 0.05 MG/ML IJ SOLN
50.0000 ug | INTRAMUSCULAR | Status: DC | PRN
  Administered 2011-06-15 (×2): 25 ug via INTRAVENOUS

## 2011-06-14 MED ORDER — MIDAZOLAM HCL 2 MG/2ML IJ SOLN: 1.0000 mg | INTRAMUSCULAR | Status: DC | PRN

## 2011-06-14 MED ORDER — INSULIN ASPART 100 UNIT/ML ~~LOC~~ SOLN
5.0000 [IU] | Freq: Three times a day (TID) | SUBCUTANEOUS | Status: DC
  Administered 2011-06-14 – 2011-06-18 (×10): 5 [IU] via SUBCUTANEOUS

## 2011-06-14 MED ORDER — INSULIN GLARGINE 100 UNIT/ML ~~LOC~~ SOLN
20.0000 [IU] | Freq: Every day | SUBCUTANEOUS | Status: DC
  Administered 2011-06-14 – 2011-06-16 (×3): 20 [IU] via SUBCUTANEOUS

## 2011-06-14 MED ORDER — LACTATED RINGERS IV SOLN
INTRAVENOUS | Status: DC
  Administered 2011-06-14 – 2011-06-15 (×2): via INTRAVENOUS

## 2011-06-14 NOTE — Progress Notes (Signed)
Subjective: Says she is doing better Able to eat better while sitting up   Objective: Vital signs in last 24 hours: Filed Vitals:   06/13/11 0641 06/13/11 1353 06/13/11 2155 06/14/11 0633  BP: 145/54 132/54 137/69 133/50  Pulse: 93 88 83 84  Temp: 98 F (36.7 C) 98.2 F (36.8 C) 97.4 F (36.3 C) 98.2 F (36.8 C)  TempSrc: Oral  Oral Oral  Resp: 18 18 20 20   Height:    4\' 11"  (1.499 m)  Weight: 63.9 kg (140 lb 14 oz)   62.2 kg (137 lb 2 oz)  SpO2: 93% 94% 98% 97%   Weight change: -1.7 kg (-3 lb 12 oz)  Intake/Output Summary (Last 24 hours) at 06/14/11 0930 Last data filed at 06/13/11 2100  Gross per 24 hour  Intake   1060 ml  Output      0 ml  Net   1060 ml    Physical Exam: General: Awake, Oriented, No acute distress. HEENT: EOMI. Neck: Supple CV: S1 and S2, rrr Lungs: Clear to ascultation bilaterally, no wheezing Abdomen: Soft, Nontender, Nondistended, +bowel sounds. Ext: Good pulses. No edema.   Lab Results:  Basename 06/13/11 0620 06/12/11 0652  NA 130* 134*  K 4.2 3.9  CL 95* 97  CO2 25 27  GLUCOSE 255* 175*  BUN 10 7  CREATININE 0.55 0.52  CALCIUM 9.1 8.9  MG -- --  PHOS -- --   No results found for this basename: AST:2,ALT:2,ALKPHOS:2,BILITOT:2,PROT:2,ALBUMIN:2 in the last 72 hours No results found for this basename: LIPASE:2,AMYLASE:2 in the last 72 hours  Basename 06/13/11 0620 06/12/11 0652  WBC 5.7 6.1  NEUTROABS -- --  HGB 11.8* 11.2*  HCT 34.0* 33.1*  MCV 81.1 81.5  PLT 233 223    Basename 06/11/11 1221  CKTOTAL 44  CKMB 1.8  CKMBINDEX --  TROPONINI <0.30   No components found with this basename: POCBNP:3 No results found for this basename: DDIMER:2 in the last 72 hours No results found for this basename: HGBA1C:2 in the last 72 hours No results found for this basename: CHOL:2,HDL:2,LDLCALC:2,TRIG:2,CHOLHDL:2,LDLDIRECT:2 in the last 72 hours No results found for this basename: TSH,T4TOTAL,FREET3,T3FREE,THYROIDAB in the last 72  hours No results found for this basename: VITAMINB12:2,FOLATE:2,FERRITIN:2,TIBC:2,IRON:2,RETICCTPCT:2 in the last 72 hours  Micro Results: No results found for this or any previous visit (from the past 240 hour(s)).  Studies/Results: No results found.  Medications: I have reviewed the patient's current medications. Scheduled Meds:    . amLODipine  2.5 mg Oral Daily  . aspirin EC  81 mg Oral Daily  . atorvastatin  10 mg Oral Daily  . benzonatate  100 mg Oral BID  . docusate sodium  100 mg Oral BID  . enoxaparin  40 mg Subcutaneous Q24H  . FLUoxetine  10 mg Oral Daily  . gabapentin  100 mg Oral BID  . glimepiride  2 mg Oral Daily  . insulin aspart  0-15 Units Subcutaneous TID WC  . insulin aspart  0-5 Units Subcutaneous QHS  . insulin glargine  18 Units Subcutaneous QHS  . levofloxacin (LEVAQUIN) IV  500 mg Intravenous Q24H  . linagliptin  5 mg Oral Daily  . loratadine  10 mg Oral Daily  . niacin  1,000 mg Oral QHS  . olmesartan  40 mg Oral Daily  . omega-3 acid ethyl esters  2 g Oral BID  . pantoprazole  40 mg Oral Q1200  . polyethylene glycol  17 g Oral BID  . senna  1 tablet Oral Daily  . vancomycin  1,000 mg Intravenous 120 min pre-op   Continuous Infusions:    . lactated ringers     PRN Meds:.acetaminophen, albuterol, bisacodyl, fentaNYL, HYDROmorphone, midazolam, ondansetron (ZOFRAN) IV, ondansetron, oxyCODONE, zolpidem  Assessment/Plan: Compression fracture of L2 lumbar vertebra (06/08/2011)  Acute low back pain due to trauma (12/20/2008)  Neurosurgery to do kyphoplasty Monday PT after- recommending 24 hour care, not able to provide at home, may need SNF for a bit  DM w/o Complication Type II (08/05/2006) Hold metformin. Continue Amaryl, Lantus, linagliptin- increase lantus, adjust medications  HYPERLIPIDEMIA (08/05/2006) Continue with Lipitor.   HYPERTENSION (08/05/2006) Continue with benicar, norvasc.   OSTEOPOROSIS NOS (08/05/2006)  ? PNA- infiltrate  with increased cough and sputum, requiring O2, add nebs and abx to see if improvement, x ray shows worsening scarring/atelectasia- add incentive spirometry  Acute respiratory failure- requiring O2, secondary to PNA- improving  ?Dysphagia- reports that she gags when she eats, does not get stuck in esophagus as on would think with stricture, problem seems higher up in back of throat, will ask speech to eval   Says she has no help at home Surgery on Monday-plan to dispo after that SNF vs home health    LOS: 7 days  Mary Tusing, DO 06/14/2011, 9:30 AM

## 2011-06-15 ENCOUNTER — Inpatient Hospital Stay (HOSPITAL_COMMUNITY): Payer: Medicare Other

## 2011-06-15 ENCOUNTER — Encounter (HOSPITAL_COMMUNITY): Payer: Self-pay | Admitting: Anesthesiology

## 2011-06-15 ENCOUNTER — Encounter (HOSPITAL_COMMUNITY)
Admission: EM | Disposition: A | Discharge status: Skilled Nursing Facility | Payer: Self-pay | Source: Home / Self Care | Attending: Internal Medicine

## 2011-06-15 ENCOUNTER — Inpatient Hospital Stay (HOSPITAL_COMMUNITY): Payer: Medicare Other | Admitting: Anesthesiology

## 2011-06-15 LAB — CREATININE, SERUM
Creatinine, Ser: 0.59 mg/dL (ref 0.50–1.10)
GFR calc non Af Amer: 88 mL/min — ABNORMAL LOW (ref >90)

## 2011-06-15 LAB — GLUCOSE, CAPILLARY
Glucose-Capillary: 158 mg/dL — ABNORMAL HIGH (ref 70–99)
Glucose-Capillary: 175 mg/dL — ABNORMAL HIGH (ref 70–99)
Glucose-Capillary: 238 mg/dL — ABNORMAL HIGH (ref 70–99)

## 2011-06-15 SURGERY — KYPHOPLASTY
Anesthesia: General | Wound class: Clean

## 2011-06-15 MED ORDER — DEXTROSE 5 % IV SOLN
INTRAVENOUS | Status: DC | PRN
  Administered 2011-06-15: 17:00:00 via INTRAVENOUS

## 2011-06-15 MED ORDER — ACETAMINOPHEN 650 MG RE SUPP: 650.0000 mg | RECTAL | Status: DC | PRN

## 2011-06-15 MED ORDER — ONDANSETRON HCL 4 MG/2ML IJ SOLN
INTRAMUSCULAR | Status: DC | PRN
  Administered 2011-06-15: 4 mg via INTRAVENOUS

## 2011-06-15 MED ORDER — PHENOL 1.4 % MT LIQD
1.0000 | OROMUCOSAL | Status: DC | PRN
  Filled 2011-06-15: qty 177

## 2011-06-15 MED ORDER — SODIUM CHLORIDE 0.9 % IV SOLN: 250.0000 mL | INTRAVENOUS | Status: DC

## 2011-06-15 MED ORDER — MIDAZOLAM HCL 2 MG/2ML IJ SOLN: 1.0000 mg | INTRAMUSCULAR | Status: DC | PRN

## 2011-06-15 MED ORDER — FENTANYL CITRATE 0.05 MG/ML IJ SOLN
INTRAMUSCULAR | Status: AC
  Filled 2011-06-15: qty 2

## 2011-06-15 MED ORDER — LIDOCAINE-EPINEPHRINE 1 %-1:100000 IJ SOLN
INTRAMUSCULAR | Status: DC | PRN
  Administered 2011-06-15: 1 mL

## 2011-06-15 MED ORDER — CEFAZOLIN SODIUM 1-5 GM-% IV SOLN
1.0000 g | Freq: Three times a day (TID) | INTRAVENOUS | Status: AC
  Administered 2011-06-15 – 2011-06-16 (×2): 1 g via INTRAVENOUS
  Filled 2011-06-15 (×2): qty 50

## 2011-06-15 MED ORDER — ROCURONIUM BROMIDE 100 MG/10ML IV SOLN
INTRAVENOUS | Status: DC | PRN
  Administered 2011-06-15: 10 mg via INTRAVENOUS

## 2011-06-15 MED ORDER — PROPOFOL 10 MG/ML IV EMUL
INTRAVENOUS | Status: DC | PRN
  Administered 2011-06-15: 150 mg via INTRAVENOUS

## 2011-06-15 MED ORDER — IOHEXOL 300 MG/ML  SOLN
INTRAMUSCULAR | Status: DC | PRN
  Administered 2011-06-15: 50 mL

## 2011-06-15 MED ORDER — MENTHOL 3 MG MT LOZG
1.0000 | LOZENGE | OROMUCOSAL | Status: DC | PRN
  Administered 2011-06-18: 3 mg via ORAL
  Filled 2011-06-15: qty 9

## 2011-06-15 MED ORDER — FENTANYL CITRATE 0.05 MG/ML IJ SOLN
INTRAMUSCULAR | Status: DC | PRN
  Administered 2011-06-15: 100 ug via INTRAVENOUS

## 2011-06-15 MED ORDER — VANCOMYCIN HCL IN DEXTROSE 1-5 GM/200ML-% IV SOLN
INTRAVENOUS | Status: AC
  Filled 2011-06-15: qty 200

## 2011-06-15 MED ORDER — BUPIVACAINE HCL (PF) 0.5 % IJ SOLN
INTRAMUSCULAR | Status: DC | PRN
  Administered 2011-06-15: 1 mL

## 2011-06-15 MED ORDER — ONDANSETRON HCL 4 MG/2ML IJ SOLN: 4.0000 mg | INTRAMUSCULAR | Status: DC | PRN

## 2011-06-15 MED ORDER — NEOSTIGMINE METHYLSULFATE 1 MG/ML IJ SOLN
INTRAMUSCULAR | Status: DC | PRN
  Administered 2011-06-15: 4 mg via INTRAVENOUS

## 2011-06-15 MED ORDER — LACTATED RINGERS IV SOLN
INTRAVENOUS | Status: DC | PRN
  Administered 2011-06-15: 17:00:00 via INTRAVENOUS

## 2011-06-15 MED ORDER — MIDAZOLAM HCL 5 MG/5ML IJ SOLN
INTRAMUSCULAR | Status: DC | PRN
  Administered 2011-06-15: 2 mg via INTRAVENOUS

## 2011-06-15 MED ORDER — GLYCOPYRROLATE 0.2 MG/ML IJ SOLN
INTRAMUSCULAR | Status: DC | PRN
  Administered 2011-06-15: .5 mg via INTRAVENOUS

## 2011-06-15 MED ORDER — FENTANYL CITRATE 0.05 MG/ML IJ SOLN: 50.0000 ug | INTRAMUSCULAR | Status: DC | PRN

## 2011-06-15 MED ORDER — SUCCINYLCHOLINE CHLORIDE 20 MG/ML IJ SOLN
INTRAMUSCULAR | Status: DC | PRN
  Administered 2011-06-15: 120 mg via INTRAVENOUS

## 2011-06-15 MED ORDER — SODIUM CHLORIDE 0.9 % IJ SOLN
3.0000 mL | Freq: Two times a day (BID) | INTRAMUSCULAR | Status: DC
  Administered 2011-06-16 – 2011-06-18 (×5): 3 mL via INTRAVENOUS

## 2011-06-15 MED ORDER — SODIUM CHLORIDE 0.9 % IJ SOLN: 3.0000 mL | INTRAMUSCULAR | Status: DC | PRN

## 2011-06-15 MED ORDER — ACETAMINOPHEN 325 MG PO TABS: 650.0000 mg | ORAL_TABLET | ORAL | Status: DC | PRN

## 2011-06-15 SURGICAL SUPPLY — 45 items
BANDAGE ADHESIVE 1X3 (GAUZE/BANDAGES/DRESSINGS) IMPLANT
BLADE SURG 11 STRL SS (BLADE) ×2 IMPLANT
BLADE SURG ROTATE 9660 (MISCELLANEOUS) IMPLANT
CEMENT BONE KYPHX HV R (Orthopedic Implant) ×2 IMPLANT
CEMENT KYPHON C01A KIT/MIXER (Cement) ×2 IMPLANT
CLOTH BEACON ORANGE TIMEOUT ST (SAFETY) ×2 IMPLANT
CONT SPEC 4OZ CLIKSEAL STRL BL (MISCELLANEOUS) ×2 IMPLANT
DECANTER SPIKE VIAL GLASS SM (MISCELLANEOUS) ×2 IMPLANT
DERMABOND ADVANCED (GAUZE/BANDAGES/DRESSINGS) ×2
DERMABOND ADVANCED .7 DNX12 (GAUZE/BANDAGES/DRESSINGS) ×2 IMPLANT
DEVICE BIOPSY BONE KYPHX (INSTRUMENTS) ×2 IMPLANT
DRAPE C-ARM 42X72 X-RAY (DRAPES) ×2 IMPLANT
DRAPE INCISE IOBAN 66X45 STRL (DRAPES) ×2 IMPLANT
DRAPE LAPAROTOMY 100X72X124 (DRAPES) ×2 IMPLANT
DRAPE PROXIMA HALF (DRAPES) ×2 IMPLANT
DURAPREP 26ML APPLICATOR (WOUND CARE) ×2 IMPLANT
GAUZE SPONGE 4X4 16PLY XRAY LF (GAUZE/BANDAGES/DRESSINGS) ×2 IMPLANT
GLOVE BIO SURGEON STRL SZ7.5 (GLOVE) IMPLANT
GLOVE BIOGEL PI IND STRL 7.5 (GLOVE) IMPLANT
GLOVE BIOGEL PI IND STRL 8.5 (GLOVE) ×1 IMPLANT
GLOVE BIOGEL PI INDICATOR 7.5 (GLOVE)
GLOVE BIOGEL PI INDICATOR 8.5 (GLOVE) ×1
GLOVE ECLIPSE 8.5 STRL (GLOVE) ×2 IMPLANT
GLOVE EXAM NITRILE LRG STRL (GLOVE) IMPLANT
GLOVE EXAM NITRILE MD LF STRL (GLOVE) IMPLANT
GLOVE EXAM NITRILE XL STR (GLOVE) IMPLANT
GLOVE EXAM NITRILE XS STR PU (GLOVE) IMPLANT
GOWN BRE IMP SLV AUR LG STRL (GOWN DISPOSABLE) ×2 IMPLANT
GOWN BRE IMP SLV AUR XL STRL (GOWN DISPOSABLE) IMPLANT
GOWN STRL REIN 2XL LVL4 (GOWN DISPOSABLE) ×2 IMPLANT
KIT BASIN OR (CUSTOM PROCEDURE TRAY) ×2 IMPLANT
KIT ROOM TURNOVER OR (KITS) ×2 IMPLANT
MIXER AND CEMENT ×2 IMPLANT
NEEDLE HYPO 25X1 1.5 SAFETY (NEEDLE) ×2 IMPLANT
NS IRRIG 1000ML POUR BTL (IV SOLUTION) ×2 IMPLANT
PACK SURGICAL SETUP 50X90 (CUSTOM PROCEDURE TRAY) ×2 IMPLANT
PAD ARMBOARD 7.5X6 YLW CONV (MISCELLANEOUS) ×6 IMPLANT
SPECIMEN JAR SMALL (MISCELLANEOUS) IMPLANT
SUT VIC AB 3-0 SH 8-18 (SUTURE) ×2 IMPLANT
SUT VIC AB 4-0 P-3 18X BRD (SUTURE) IMPLANT
SUT VIC AB 4-0 P3 18 (SUTURE)
SYR CONTROL 10ML LL (SYRINGE) ×4 IMPLANT
TOWEL OR 17X24 6PK STRL BLUE (TOWEL DISPOSABLE) ×2 IMPLANT
TOWEL OR 17X26 10 PK STRL BLUE (TOWEL DISPOSABLE) ×2 IMPLANT
TRAY KYPHOPAK 20/3 ONESTEP 1ST (MISCELLANEOUS) ×2 IMPLANT

## 2011-06-15 NOTE — Anesthesia Preprocedure Evaluation (Addendum)
Anesthesia Evaluation  Patient identified by MRN, date of birth, ID band Patient awake    Reviewed: Allergy & Precautions, H&P , NPO status , Patient's Chart, lab work & pertinent test results  History of Anesthesia Complications Negative for: history of anesthetic complications  Airway Mallampati: II TM Distance: >3 FB Neck ROM: Full    Dental  (+) Missing, Partial Lower, Partial Upper and Dental Advisory Given   Pulmonary neg pulmonary ROS,  breath sounds clear to auscultation  Pulmonary exam normal       Cardiovascular hypertension, Pt. on medications Rhythm:Regular Rate:Normal     Neuro/Psych negative neurological ROS     GI/Hepatic Neg liver ROS, GERD-  Controlled,  Endo/Other  Diabetes mellitus-, Type 2, Oral Hypoglycemic Agents and Insulin Dependent  Renal/GU negative Renal ROS     Musculoskeletal   Abdominal   Peds  Hematology negative hematology ROS (+)   Anesthesia Other Findings   Reproductive/Obstetrics                          Anesthesia Physical Anesthesia Plan  ASA: III  Anesthesia Plan: General   Post-op Pain Management:    Induction: Intravenous  Airway Management Planned: Oral ETT  Additional Equipment:   Intra-op Plan:   Post-operative Plan: Extubation in OR  Informed Consent: I have reviewed the patients History and Physical, chart, labs and discussed the procedure including the risks, benefits and alternatives for the proposed anesthesia with the patient or authorized representative who has indicated his/her understanding and acceptance.   Dental advisory given  Plan Discussed with: CRNA, Anesthesiologist and Surgeon  Anesthesia Plan Comments:         Anesthesia Quick Evaluation

## 2011-06-15 NOTE — Progress Notes (Signed)
SLP Cancellation Note  Treatment cancelled today due to medical issues with patient which prohibited therapy.  Patient is NPO for surgery this afternoon, therefore, swallow eval. Was deferred.  SLP interviewed patient and husband re: dysphagia.  Patient reports a chronic h/o difficulty swallowing, primarily with solid foods "sticking in throat" but also occasionally choking on liquids.  Previous records reveal patient underwent esophageal dilitation in 11/2001.  Suspect esophageal dysphagia, with possible stricture vs. UES dysfunction.   Recommend MBS for objective study.  Patient may also benefit from an esophagram following the MBS if indicated.  MD, please order if you agree.  Maryjo Rochester T 06/15/2011, 3:08 PM

## 2011-06-15 NOTE — Anesthesia Procedure Notes (Signed)
Procedure Name: Intubation Date/Time: 06/15/2011 5:02 PM Performed by: Fanchon Papania S Pre-anesthesia Checklist: Patient identified, Emergency Drugs available, Suction available, Patient being monitored and Timeout performed Patient Re-evaluated:Patient Re-evaluated prior to inductionOxygen Delivery Method: Circle system utilized Preoxygenation: Pre-oxygenation with 100% oxygen Intubation Type: IV induction Laryngoscope Size: Mac and 3 Grade View: Grade I Tube type: Oral Tube size: 7.5 mm Number of attempts: 1 Airway Equipment and Method: Stylet Placement Confirmation: ETT inserted through vocal cords under direct vision and breath sounds checked- equal and bilateral Secured at: 22 cm Tube secured with: Tape Dental Injury: Teeth and Oropharynx as per pre-operative assessment

## 2011-06-15 NOTE — Preoperative (Signed)
Beta Blockers   Reason not to administer Beta Blockers:Not Applicable 

## 2011-06-15 NOTE — Op Note (Signed)
Date of surgery: 06/15/2011 Preoperative diagnosis: L2 compression fracture, osteoporosis Post operative diagnosis: L2 compression fracture, osteoporosis Procedure: L2 acrylic balloon kyphoplasty Surgeon: Sherilyn Cooter Anuradha Chabot,MD Indications: Patient is a 77 year old individual who sustained a fall onto her buttocks she has evidence of an L2 compression fracture she has evidence of osteoporosis on her x-rays. An acrylic balloon kyphoplasty is now being performed to help control pain.  Procedure: The patient was brought to the operating room supine on a stretcher. After the smooth induction of general endotracheal anesthesia the patient was carefully positioned in the prone position on the operating table with the bony prominences appropriately padded and protected. Overalls were used under the patient's chest back was prepped with alcohol and DuraPrep and draped in a sterile fashion and then biplane fluoroscopy was used to isolate the L2 vertebrae. A pedicle entry site for this her vertebrae was chosen 4 cm lateral to the pedicle at the 9:00 position. A Jamshidi needle was inserted into the pedicle via the transfer radicular transvertebral approach. Biplane fluoroscopy was used during this procedure. Then the cannula was in the vertebral body the inner cannula was removed and a bone drill was used to create a space for the balloon. Balloon was then inflated 250 mmHg and was noted to deteriorate to 150 millimeters of pressure. Four Cc of dye was injected into the balloon. The med was mixed to appropriate consistency and then injected into the vertebral body under biplane fluoroscopy until complete filling was achieved. A total of six cc of cement was injected.  Final fluoroscopic images were obtained the Jamshidi needle was removed the incision was closed with a singular 3-0 Vicryl stitch and the patient was then returned to the recovery room in stable condition.

## 2011-06-15 NOTE — Anesthesia Postprocedure Evaluation (Signed)
  Anesthesia Post-op Note  Patient: Mary Flynn  Procedure(s) Performed: Procedure(s) (LRB): KYPHOPLASTY (N/A)  Patient Location: PACU  Anesthesia Type: General  Level of Consciousness: awake and alert   Airway and Oxygen Therapy: Patient Spontanous Breathing and Patient connected to nasal cannula oxygen  Post-op Pain: moderate  Post-op Assessment: Post-op Vital signs reviewed, Patient's Cardiovascular Status Stable, Respiratory Function Stable, Patent Airway, No signs of Nausea or vomiting and Pain level controlled  Post-op Vital Signs: Reviewed and stable  Complications: No apparent anesthesia complications

## 2011-06-15 NOTE — Progress Notes (Signed)
PT Cancellation Note  Treatment cancelled today due to patient's refusal to participate.  The patient is more painful today, anxious about her surgery today.  RN in room and aware of pain.  Pt. Is agreeable for PT to check back tomorrow after surgery.  She is still very concerned about her breathing/ "phlem".    Rollene Rotunda Rindi Beechy, PT, DPT [phone] 06/15/2011, 10:37 AM

## 2011-06-15 NOTE — Progress Notes (Signed)
Subjective: Doing well, coughing up more mucus today No fever/no chills Breathing fine  Objective: Vital signs in last 24 hours: Filed Vitals:   06/14/11 1200 06/14/11 2333 06/15/11 0500 06/15/11 0604  BP: 121/74 123/70  150/75  Pulse: 86 81  87  Temp: 97.4 F (36.3 C) 97.9 F (36.6 C)  97.9 F (36.6 C)  TempSrc: Oral Oral  Oral  Resp: 18 20  18   Height:      Weight:   64.1 kg (141 lb 5 oz) 64.1 kg (141 lb 5 oz)  SpO2: 95% 95%  98%   Weight change: 1.9 kg (4 lb 3 oz)  Intake/Output Summary (Last 24 hours) at 06/15/11 0846 Last data filed at 06/15/11 0000  Gross per 24 hour  Intake 198.33 ml  Output      0 ml  Net 198.33 ml    Physical Exam: General: Awake, Oriented, No acute distress. HEENT: EOMI. Neck: Supple CV: S1 and S2, rrr Lungs: Clear to ascultation bilaterally, no wheezing Abdomen: Soft, Nontender, Nondistended, +bowel sounds. Ext: Good pulses. No edema.   Lab Results:  Basename 06/15/11 0608 06/14/11 0920 06/13/11 0620  NA -- 131* 130*  K -- 4.3 4.2  CL -- 92* 95*  CO2 -- 25 25  GLUCOSE -- 312* 255*  BUN -- 12 10  CREATININE 0.59 0.60 --  CALCIUM -- 9.3 9.1  MG -- -- --  PHOS -- -- --   No results found for this basename: AST:2,ALT:2,ALKPHOS:2,BILITOT:2,PROT:2,ALBUMIN:2 in the last 72 hours No results found for this basename: LIPASE:2,AMYLASE:2 in the last 72 hours  Basename 06/14/11 0920 06/13/11 0620  WBC 6.6 5.7  NEUTROABS -- --  HGB 12.3 11.8*  HCT 35.6* 34.0*  MCV 81.8 81.1  PLT 308 233   No results found for this basename: CKTOTAL:3,CKMB:3,CKMBINDEX:3,TROPONINI:3 in the last 72 hours No components found with this basename: POCBNP:3 No results found for this basename: DDIMER:2 in the last 72 hours No results found for this basename: HGBA1C:2 in the last 72 hours No results found for this basename: CHOL:2,HDL:2,LDLCALC:2,TRIG:2,CHOLHDL:2,LDLDIRECT:2 in the last 72 hours No results found for this basename:  TSH,T4TOTAL,FREET3,T3FREE,THYROIDAB in the last 72 hours No results found for this basename: VITAMINB12:2,FOLATE:2,FERRITIN:2,TIBC:2,IRON:2,RETICCTPCT:2 in the last 72 hours  Micro Results: No results found for this or any previous visit (from the past 240 hour(s)).  Studies/Results: No results found.  Medications: I have reviewed the patient's current medications. Scheduled Meds:    . amLODipine  2.5 mg Oral Daily  . aspirin EC  81 mg Oral Daily  . atorvastatin  10 mg Oral Daily  . benzonatate  100 mg Oral BID  . docusate sodium  100 mg Oral BID  . enoxaparin  40 mg Subcutaneous Q24H  . FLUoxetine  10 mg Oral Daily  . gabapentin  100 mg Oral BID  . glimepiride  2 mg Oral Daily  . insulin aspart  0-15 Units Subcutaneous TID WC  . insulin aspart  0-5 Units Subcutaneous QHS  . insulin aspart  5 Units Subcutaneous TID WC  . insulin glargine  20 Units Subcutaneous QHS  . levofloxacin (LEVAQUIN) IV  500 mg Intravenous Q24H  . linagliptin  5 mg Oral Daily  . loratadine  10 mg Oral Daily  . niacin  1,000 mg Oral QHS  . olmesartan  40 mg Oral Daily  . omega-3 acid ethyl esters  2 g Oral BID  . pantoprazole  40 mg Oral Q1200  . polyethylene glycol  17 g  Oral BID  . senna  1 tablet Oral Daily  . vancomycin  1,000 mg Intravenous 120 min pre-op  . DISCONTD: insulin glargine  18 Units Subcutaneous QHS   Continuous Infusions:    . lactated ringers 50 mL/hr at 06/14/11 2202   PRN Meds:.acetaminophen, albuterol, bisacodyl, fentaNYL, HYDROmorphone, midazolam, ondansetron (ZOFRAN) IV, ondansetron, oxyCODONE, zolpidem  Assessment/Plan: Compression fracture of L2 lumbar vertebra (06/08/2011)  Acute low back pain due to trauma (12/20/2008)  Neurosurgery to do kyphoplasty Monday PT after- recommending 24 hour care, not able to provide at home, may need SNF for a bit  DM w/o Complication Type II (08/05/2006) Hold metformin. Continue Amaryl, Lantus, linagliptin- increase lantus, adjust  medications  HYPERLIPIDEMIA (08/05/2006) Continue with Lipitor.   HYPERTENSION (08/05/2006) Continue with benicar, norvasc.   OSTEOPOROSIS NOS (08/05/2006)  ? PNA- infiltrate with increased cough and sputum, requiring O2, add nebs and abx to see if improvement, x ray shows worsening scarring/atelectasia- add incentive spirometry  Acute respiratory failure- requiring O2, secondary to PNA- improving  ?Dysphagia- reports that she gags when she eats, does not get stuck in esophagus as on would think with stricture, problem seems higher up in back of throat, will ask speech to eval   Says she has no help at home Surgery on Monday-plan to dispo after that SNF vs home health    LOS: 8 days  Mary Delbridge, DO 06/15/2011, 8:46 AM

## 2011-06-15 NOTE — Transfer of Care (Signed)
Immediate Anesthesia Transfer of Care Note  Patient: Mary Flynn  Procedure(s) Performed: Procedure(s) (LRB): KYPHOPLASTY (N/A)  Patient Location: PACU  Anesthesia Type: General  Level of Consciousness: awake, alert  and oriented  Airway & Oxygen Therapy: Patient Spontanous Breathing and Patient connected to nasal cannula oxygen  Post-op Assessment: Report given to PACU RN and Post -op Vital signs reviewed and stable  Post vital signs: Reviewed and stable  Complications: No apparent anesthesia complications

## 2011-06-16 ENCOUNTER — Inpatient Hospital Stay (HOSPITAL_COMMUNITY): Payer: Medicare Other

## 2011-06-16 LAB — GLUCOSE, CAPILLARY
Glucose-Capillary: 163 mg/dL — ABNORMAL HIGH (ref 70–99)
Glucose-Capillary: 246 mg/dL — ABNORMAL HIGH (ref 70–99)

## 2011-06-16 MED ORDER — LEVOFLOXACIN 500 MG PO TABS
500.0000 mg | ORAL_TABLET | Freq: Every day | ORAL | Status: DC
  Administered 2011-06-16 – 2011-06-18 (×3): 500 mg via ORAL
  Filled 2011-06-16 (×3): qty 1

## 2011-06-16 NOTE — Progress Notes (Signed)
Subjective: Doing well, coughing up more mucus today No fever/no chills Breathing fine  Objective: Vital signs in last 24 hours: Filed Vitals:   06/15/11 1852 06/15/11 2200 06/16/11 0200 06/16/11 0600  BP: 149/77 126/77 125/74 138/78  Pulse: 85 91 97 93  Temp: 97.4 F (36.3 C) 97.9 F (36.6 C) 98 F (36.7 C) 99 F (37.2 C)  TempSrc: Oral Oral Oral Oral  Resp: 23 20 20 20   Height:      Weight:    65.7 kg (144 lb 13.5 oz)  SpO2: 90% 92% 93% 93%   Weight change: 1.6 kg (3 lb 8.4 oz)  Intake/Output Summary (Last 24 hours) at 06/16/11 1056 Last data filed at 06/15/11 2049  Gross per 24 hour  Intake    950 ml  Output    350 ml  Net    600 ml    Physical Exam: General: Awake, Oriented, No acute distress. HEENT: EOMI. Neck: Supple CV: S1 and S2, rrr Lungs: Clear to ascultation bilaterally, no wheezing Abdomen: Soft, Nontender, Nondistended, +bowel sounds. Ext: Good pulses. No edema.   Lab Results:  [facility] 06/15/11 0608 06/14/11 0920  NA -- 131*  K -- 4.3  CL -- 92*  CO2 -- 25  GLUCOSE -- 312*  BUN -- 12  CREATININE 0.59 0.60  CALCIUM -- 9.3  MG -- --  PHOS -- --   No results found for this basename: AST:2,ALT:2,ALKPHOS:2,BILITOT:2,PROT:2,ALBUMIN:2 in the last 72 hours No results found for this basename: LIPASE:2,AMYLASE:2 in the last 72 hours  Basename 06/14/11 0920  WBC 6.6  NEUTROABS --  HGB 12.3  HCT 35.6*  MCV 81.8  PLT 308   No results found for this basename: CKTOTAL:3,CKMB:3,CKMBINDEX:3,TROPONINI:3 in the last 72 hours No components found with this basename: POCBNP:3 No results found for this basename: DDIMER:2 in the last 72 hours No results found for this basename: HGBA1C:2 in the last 72 hours No results found for this basename: CHOL:2,HDL:2,LDLCALC:2,TRIG:2,CHOLHDL:2,LDLDIRECT:2 in the last 72 hours No results found for this basename: TSH,T4TOTAL,FREET3,T3FREE,THYROIDAB in the last 72 hours No results found for this basename:  VITAMINB12:2,FOLATE:2,FERRITIN:2,TIBC:2,IRON:2,RETICCTPCT:2 in the last 72 hours  Micro Results: No results found for this or any previous visit (from the past 240 hour(s)).  Studies/Results: Dg Lumbar Spine 2-3 Views  06/15/2011  *RADIOLOGY REPORT*  Clinical Data: L2 kyphoplasty  LUMBAR SPINE - 2-3 VIEW  Comparison: CT 06/07/2011 at the [facility]  Findings: Two intraprocedural fluoroscopic images are provided, indicating cement placement at the site of previously seen L2 compression fracture.  IMPRESSION: L2 kyphoplasty as above.  Original Report Authenticated By: Harrel Lemon, M.D.    Medications: I have reviewed the patient's current medications. Scheduled Meds:    . amLODipine  2.5 mg Oral Daily  . aspirin EC  81 mg Oral Daily  . atorvastatin  10 mg Oral Daily  . benzonatate  100 mg Oral BID  .  ceFAZolin (ANCEF) IV  1 g Intravenous Q8H  . docusate sodium  100 mg Oral BID  . enoxaparin  40 mg Subcutaneous Q24H  . fentaNYL      . FLUoxetine  10 mg Oral Daily  . gabapentin  100 mg Oral BID  . glimepiride  2 mg Oral Daily  . insulin aspart  0-15 Units Subcutaneous TID WC  . insulin aspart  0-5 Units Subcutaneous QHS  . insulin aspart  5 Units Subcutaneous TID WC  . insulin glargine  20 Units Subcutaneous QHS  .  levofloxacin (LEVAQUIN) IV  500 mg Intravenous Q24H  . linagliptin  5 mg Oral Daily  . loratadine  10 mg Oral Daily  . niacin  1,000 mg Oral QHS  . olmesartan  40 mg Oral Daily  . omega-3 acid ethyl esters  2 g Oral BID  . pantoprazole  40 mg Oral Q1200  . polyethylene glycol  17 g Oral BID  . senna  1 tablet Oral Daily  . sodium chloride  3 mL Intravenous Q12H  . vancomycin      . vancomycin  1,000 mg Intravenous 120 min pre-op   Continuous Infusions:    . sodium chloride    . DISCONTD: lactated ringers 50 mL/hr at 06/15/11 1808   PRN Meds:.acetaminophen, acetaminophen, acetaminophen, albuterol, bisacodyl, HYDROmorphone,  menthol-cetylpyridinium, ondansetron (ZOFRAN) IV, ondansetron (ZOFRAN) IV, ondansetron, oxyCODONE, phenol, sodium chloride, zolpidem, DISCONTD: bupivacaine, DISCONTD: fentaNYL, DISCONTD: fentaNYL, DISCONTD: iohexol, DISCONTD: lidocaine-EPINEPHrine, DISCONTD: midazolam, DISCONTD: midazolam  Assessment/Plan: Compression fracture of L2 lumbar vertebra (06/08/2011)  Acute low back pain due to trauma (12/20/2008)  Neurosurgery to do kyphoplasty Monday PT after- recommending 24 hour care, not able to provide at home, may need SNF for a bit  DM w/o Complication Type II (08/05/2006) Hold metformin. Continue Amaryl, Lantus, linagliptin- increase lantus, adjust medications- diet different in hospital as patient eats BETTER as an outpatient  HYPERLIPIDEMIA (08/05/2006) Continue with Lipitor.   HYPERTENSION (08/05/2006) Continue with benicar, norvasc.   OSTEOPOROSIS NOS (08/05/2006)  ? PNA- D/c abx tomm (wed), infiltrate with increased cough and sputum, requiring O2, add nebs and abx to see if improvement, x ray shows worsening scarring/atelectasia- add incentive spirometry  Acute respiratory failure- requiring O2, secondary to PNA- improving  ?Dysphagia- MBS ok- has had extensive outpatient work up=[facility], her with pulm  DISPO: Spoke at length with patient who is agreeable to go home with wheeled walker and home health-  Suggested to her she could RENT a hospital bed if needed- we will give her a number      LOS: 9 days  Mary Wessinger, DO 06/16/2011, 10:56 AM

## 2011-06-16 NOTE — Progress Notes (Signed)
Quick Note:  Spoke with pt's husband-pt is currently in hospital due to fall and had surgery-he will relay message about negative results and understands for patient to keep her OV in June 2013 with CY. ______

## 2011-06-16 NOTE — Progress Notes (Addendum)
Clinical Social Work Department CLINICAL SOCIAL WORK PLACEMENT NOTE 06/16/2011  Patient:  Flynn, Mary  Account Number:  [id number] Admit date:  06/07/2011  Clinical Social Worker:  Jacelyn Grip  Date/time:  06/16/2011 11:45 AM  Clinical Social Work is seeking post-discharge placement for this patient at the following level of care:   SKILLED NURSING   (*CSW will update this form in Epic as items are completed)   06/16/2011  Patient/family provided with [facility] Department of Clinical Social Work's list of facilities offering this level of care within the geographic area requested by the patient (or if unable, by the patient's family).  06/16/2011  Patient/family informed of their freedom to choose among providers that offer the needed level of care, that participate in Medicare, Medicaid or managed care program needed by the patient, have an available bed and are willing to accept the patient.  06/16/2011  Patient/family informed of [facility]' ownership interest in [facility], as well as of the fact that they are under no obligation to receive care at this facility.  PASARR submitted to EDS on 06/16/2011 PASARR number received from EDS on 06/17/2011   FL2 transmitted to all facilities in geographic area requested by pt/family on  06/16/2011 FL2 transmitted to all facilities within larger geographic area on   Patient informed that his/her managed care company has contracts with or will negotiate with  certain facilities, including the following:     Patient/family informed of bed offers received: 06/17/2011  Patient chooses bed at [facility]  Physician recommends and patient chooses bed at    Patient to be transferred to  [facility]   Patient to be transferred to facility by pt husband by car  The following physician request were entered in Epic:   Additional Comments:  Bed offers not provided as pt decided to go home  with home home health services with 24 hour supervision. Plan appropriate according to PT/OT and MD.   Addendum 06/17/11 15:00pm- Received notification from CM that pt and pt husband have now decided for pt to go to SNF for rehab. Bed offers provided and pt chooses bed at [facility].  Jacklynn Lewis, MSW, LCSWA  Clinical Social Work [phone]

## 2011-06-16 NOTE — Progress Notes (Signed)
Physical Therapy Treatment/Re-evaluation Patient Details Name: Mary Flynn MRN: [id number] DOB: [birth year] Today's Date: 06/16/2011 Time: 4098-1191 PT Time Calculation (min): 25 min  PT Assessment / Plan / Recommendation Comments on Treatment Session  The patient is now POD #1 s/p kyphoplasty and has found significant relief in pain and increased confidence in mobility now.  She continues to be unsteady on her feet and I encouraged her to go for another walk with staff or family this afternoon, but use a RW.      Follow Up Recommendations  Home health PT;Supervision/Assistance - 24 hour    Equipment Recommendations  Rolling walker with 5" wheels (hospital bed)    Frequency Min 5X/week   Plan Discharge plan remains appropriate;Frequency remains appropriate    Precautions / Restrictions  none  Pertinent Vitals/Pain O2 sats 89% on RA after walking, RN informed and O2 via [place] returned to nose, reviewed incentive spirometer use (patient needed max verbal cues to complete this correctly) x5 reps <1,200 mL max     Mobility  Bed Mobility Rolling Right: 6: Modified independent (Device/Increase time);With rail Right Sidelying to Sit: HOB elevated;With rails;6: Modified independent (Device/Increase time) Sitting - Scoot to Edge of Bed: 6: Modified independent (Device/Increase time) Details for Bed Mobility Assistance: heavy reliance on ralis with HOB raised.  Patient is interested in hospital bed for home use because of these two features.    Transfers Sit to Stand: 4: Min guard;From elevated surface;From bed Stand to Sit: 4: Min guard;To chair/3-in-1;With armrests;With upper extremity assist Details for Transfer Assistance: min guard assist for safety some staggering upon initally standing.    Ambulation/Gait Ambulation/Gait Assistance: 4: Min assist Ambulation Distance (Feet): 100 Feet Assistive device: 1 person hand held assist Ambulation/Gait Assistance Details: min assist to steady  patient for balance while walking.  Staggering gait pattern.  Gait Pattern:  (staggering gait pattern.  )     PT Goals Acute Rehab PT Goals PT Goal Formulation: With patient Time For Goal Achievement: 06/23/11 Potential to Achieve Goals: Good Pt will Roll Supine to Right Side: with modified independence;with rail PT Goal: Rolling Supine to Right Side - Progress: Goal set today Pt will Roll Supine to Left Side: with modified independence;with rail PT Goal: Rolling Supine to Left Side - Progress: Goal set today Pt will go Supine/Side to Sit: with modified independence;with HOB 0 degrees PT Goal: Supine/Side to Sit - Progress: Goal set today Pt will go Sit to Supine/Side: with modified independence;with HOB 0 degrees PT Goal: Sit to Supine/Side - Progress: Goal set today Pt will go Sit to Stand: with modified independence PT Goal: Sit to Stand - Progress: Goal set today Pt will go Stand to Sit: with modified independence PT Goal: Stand to Sit - Progress: Goal set today Pt will Transfer Bed to Chair/Chair to Bed: with modified independence PT Transfer Goal: Bed to Chair/Chair to Bed - Progress: Goal set today Pt will Ambulate: >150 feet;with modified independence;with rolling walker PT Goal: Ambulate - Progress: Goal set today Pt will Go Up / Down Stairs: 1-2 stairs;with min assist;with least restrictive assistive device PT Goal: Up/Down Stairs - Progress: Goal set today  Visit Information  Last PT Received On: 06/16/11 Assistance Needed: +1    Subjective Data  Subjective: The patient reports increased confidence with mobility since surgery yesterday.   Patient Stated Goal: to go home at discharge.  "maybe Thursday"   Cognition  Overall Cognitive Status: Appears within functional limits for tasks assessed/performed Arousal/Alertness: Awake/alert Behavior  During Session: [facility] for tasks performed    End of Session PT - End of Session Activity Tolerance: Patient limited by  fatigue Patient left: in chair;with call bell/phone within reach;with family/visitor present    Lurena Joiner B. Raul Winterhalter, PT, DPT [phone] 06/16/2011, 12:38 PM

## 2011-06-16 NOTE — Progress Notes (Signed)
Clinical Social Worker received notification from Sports coach that pt requesting for initiation of SNF search to [facility]. Per Case Manager, pt aware unsure if pt insurance is going to cover SNF placement, but pt wants to inquire if insurance will cover for her to stay a short time at SNF. Clinical Social Worker completed [employer] and initiated SNF search to [facility]. Clinical Social Worker to follow up with pt in regard to bed offers and contact facility of choice to determine insurance coverage. Clinical Social Worker to continue to follow.  Jacklynn Lewis, MSW, LCSWA  Clinical Social Work [phone]

## 2011-06-16 NOTE — Evaluation (Signed)
Occupational Therapy Evaluation Patient Details Name: Mary Flynn MRN: [id number] DOB: 08-Oct-1934 Today's Date: 06/16/2011 Time: 4098-1191 OT Time Calculation (min): 23 min  OT Assessment / Plan / Recommendation Clinical Impression  This 77 y.o. female admitted for L2 compression fracture and s/p kyphoplasty.  Pt. doing well with BADLs, husband is supportive.  Pt. demonstrates the below listed deficits and will benefit from OT to maximize safety and independence with ADLs to allow pt to return home with husband and supervision    OT Assessment  Patient needs continued OT Services    Follow Up Recommendations  No OT follow up;Supervision/Assistance - 24 hour    Equipment Recommendations  Tub/shower seat    Frequency Min 2X/week    Precautions / Restrictions Precautions Precautions: None Restrictions Weight Bearing Restrictions: No       ADL  Eating/Feeding: Independent;Performed Where Assessed - Eating/Feeding: Chair Grooming: Simulated;Wash/dry hands;Wash/dry face;Denture care;Teeth care;Supervision/safety Where Assessed - Grooming: Standing at sink Upper Body Bathing: Simulated;Set up Where Assessed - Upper Body Bathing: Sitting, chair Lower Body Bathing: Simulated;Supervision/safety Where Assessed - Lower Body Bathing: Sit to stand from chair Upper Body Dressing: Simulated;Set up Where Assessed - Upper Body Dressing: Sitting, chair Lower Body Dressing: Performed;Supervision/safety Where Assessed - Lower Body Dressing: Sitting, chair Toilet Transfer: Performed;Supervision/safety Toilet Transfer Method: Ambulating Toilet Transfer Equipment: Comfort height toilet Toileting - Clothing Manipulation: Performed;Supervision/safety Where Assessed - Toileting Clothing Manipulation: Standing Toileting - Hygiene: Simulated;Independent Where Assessed - Toileting Hygiene: Sit on 3-in-1 or toilet Equipment Used: Rolling walker Ambulation Related to ADLs: supervision ADL Comments:  Pt. very motivated.  Able to access feet for LB ADLs by crossing ankles over knees    OT Goals Acute Rehab OT Goals OT Goal Formulation: With patient Time For Goal Achievement: 06/23/11 Potential to Achieve Goals: Good ADL Goals Pt Will Perform Grooming: with modified independence;Standing at sink ADL Goal: Grooming - Progress: Goal set today Pt Will Transfer to Toilet: with modified independence;Ambulation;Comfort height toilet ADL Goal: Toilet Transfer - Progress: Goal set today Pt Will Perform Tub/Shower Transfer: Tub transfer;with min assist;Shower seat with back ADL Goal: Tub/Shower Transfer - Progress: Goal set today  Visit Information  Last OT Received On: 06/16/11 Assistance Needed: +1    Subjective Data  Subjective: "Can I take a shower (at home)" Patient Stated Goal: To get better   Prior Functioning  Home Living Lives With: Spouse Available Help at Discharge: Family Type of Home: Apartment Home Access: Stairs to enter Secretary/administrator of Steps: 1 Entrance Stairs-Rails: None Home Layout: One level Bathroom Shower/Tub: Forensic scientist: Standard Bathroom Accessibility: Yes How Accessible: Accessible via walker Home Adaptive Equipment: None Prior Function Level of Independence: Independent Able to Take Stairs?: Yes Driving: No Vocation: Retired Musician: No difficulties Dominant Hand: Right    Cognition  Overall Cognitive Status: Appears within functional limits for tasks assessed/performed Arousal/Alertness: Awake/alert Orientation Level: Oriented X4 / Intact Behavior During Session: WFL for tasks performed    Extremity/Trunk Assessment Right Upper Extremity Assessment RUE ROM/Strength/Tone: Within functional levels RUE Sensation: WFL - Light Touch RUE Coordination: WFL - gross/fine motor Left Upper Extremity Assessment LUE ROM/Strength/Tone: Within functional levels LUE Sensation: WFL - Light  Touch LUE Coordination: WFL - gross/fine motor   Mobility Bed Mobility Bed Mobility: Rolling Right;Right Sidelying to Sit;Sit to Sidelying Right Rolling Right: 6: Modified independent (Device/Increase time);With rail Right Sidelying to Sit: HOB elevated;With rails;6: Modified independent (Device/Increase time) Sitting - Scoot to Edge of Bed: 6: Modified independent (Device/Increase time)  Sit to Sidelying Right: 6: Modified independent (Device/Increase time);HOB flat;With rail Transfers Transfers: Sit to Stand;Stand to Sit Sit to Stand: 5: Supervision;From bed;From chair/3-in-1;With upper extremity assist Stand to Sit: 5: Supervision;With upper extremity assist;To bed;To chair/3-in-1   Exercise    Balance    End of Session OT - End of Session Activity Tolerance: Patient tolerated treatment well Patient left: in bed;with call bell/phone within reach;with family/visitor present   Drayven Marchena, Ursula Alert M 06/16/2011, 5:06 PM

## 2011-06-16 NOTE — Procedures (Signed)
Modified Barium Swallowing Study  Patient Details  Name: Mary Flynn MRN: [id number] Date of Birth: August 24, 1934  Today's Date: 06/16/2011 Time:  -     Past Medical History:  Past Medical History  Diagnosis Date  . Diabetes mellitus   . Hyperlipidemia   . Hypertension    Past Surgical History:  Past Surgical History  Procedure Date  . Appendectomy   . Surgical sterilization 40 years ago  . Refractive surgery     Left Eye   HPI:  78 y.o. female admitted after fall with compressive fracture of L2; underwent acrylic balloon kyphoplasty 4/22.  Pt reports chronic h/o difficulty swallowing, primarily with solid foods "sticking in throat" but also occasionally choking on liquids.  Previous records reveal patient underwent esophageal dilatation in 11/2001.  MBS rec for pharyngeal eval and cervical esophageal screen.      Recommendation/Prognosis   Dysphagia Diagnosis: Within Functional Limits Clinical impression: Pt presents with normal oropharyngeal swallow function; presence of prominent cricopharyngeus and intermittent backflow of barium from distal to mid thoracic esophagus.  If esophageal symptoms continue, pt may benefit from esophageal evaluation.   Recommendations Diet Recommendations: Regular;Thin liquid Liquid Administration via: Straw;Cup Medication Administration: Whole meds with liquid Supervision: Patient able to self feed Follow up Recommendations: None   Individuals Consulted Consulted and Agree with Results and Recommendations: Patient   Marchelle Folks L. Samson Fred[place], [place] CCC/SLP Pager [phone]   Blenda Mounts Laurice 06/16/2011, 11:51 AM

## 2011-06-16 NOTE — Progress Notes (Signed)
06/16/2011 Fransico Michael SPARKS Case Management Note [phone]  HOME HEALTH AGENCIES SERVING [facility]   Agencies that are Medicare-Certified and are affiliated with [facility] Agency  Telephone Number Address  [facility]   [facility] has ownership interest in this company; however, you are under no obligation to use this agency. [phone] or  [phone] [address] [place], [place] [address]   Agencies that are Medicare-Certified and are not affiliated with [facility] Agency Telephone Number Address  [facility] [phone] Fax [phone] [address] [place], [place]  [address]  [facility] [phone] or [phone] Fax [phone] [address] [place], [place] [address]  [facility] [phone] Fax [phone] [address] [place], [place] [address]  [facility] [phone] Fax [phone] [address] [place], [place]  [address]  [facility] [phone] Fax [phone] [address] Suite [place], [place] [address]  [facility] [phone] [address] [place], [place] [address]  [facility] [phone]  [address] [place], [place] [address]  [facility] [phone] or [phone] Fax [phone] [address] [place], [place]  [address]  [facility] [phone] Fax [phone] [address] [place], [place]  [address]  [facility]  [phone] Fax [phone] [address] [place], [place] [address]               Agencies that are not Medicare-Certified and are not affiliated with [facility] Agency Telephone Number Address  [facility] [place] [phone] or [phone] Fax [phone] [address] [place]  [address]  [facility] [phone] Fax [phone] [address] [place], [place]  [address]  [facility]  [phone] Fax [phone] [address] [place], [place] [address]  [facility] [phone] Fax [phone] [address] [place], [place] [address]  [facility] [phone] or [phone] Fax [phone] [address] [place], [place]  [address]  [facility] [phone] or [phone] Fax [phone] [address] Suite [place], [place]  [address]  [facility] [phone] Fax [phone] [address] [place], [place]  [address]  [facility] [phone] [address] [place], [place]  [address]  [facility] [phone] Fax [phone] [address] [place], [place]  [address]  [facility] [phone] Fax [phone] [address] [place], [place]  [address]  [facility] [phone] Fax [phone] [address] [place], [place] [address]  [facility] [phone] Fax [phone] [address] [place], [place]  [address]   In to offer choice of agencies to patient for home  health services. Patient voicing concern regarding going home and not being "strong enough". Patient requesting that CSW look into SNF for short term rehab. "if not, I will go home". Selena Lesser, CSW notified of patient request and permission to fax out information. NCM to check back with patient regarding HH decision.

## 2011-06-17 LAB — GLUCOSE, CAPILLARY
Glucose-Capillary: 176 mg/dL — ABNORMAL HIGH (ref 70–99)
Glucose-Capillary: 219 mg/dL — ABNORMAL HIGH (ref 70–99)
Glucose-Capillary: 136 mg/dL — ABNORMAL HIGH (ref 70–99)
Glucose-Capillary: 76 mg/dL (ref 70–99)
Glucose-Capillary: 175 mg/dL — ABNORMAL HIGH (ref 70–99)

## 2011-06-17 MED ORDER — CALCIUM CARBONATE-VITAMIN D 500-200 MG-UNIT PO TABS
1.0000 | ORAL_TABLET | Freq: Two times a day (BID) | ORAL | Status: DC
  Administered 2011-06-17 – 2011-06-18 (×3): 1 via ORAL
  Filled 2011-06-17 (×5): qty 1

## 2011-06-17 NOTE — Discharge Summary (Signed)
Physician Discharge Summary  Patient ID: Mary Flynn MRN: [id number] DOB/AGE: [birth year] 75 y.o.  Admit date: 06/07/2011 Discharge date: 06/18/2011  Primary Care Physician:  Loreen Freud, DO, DO   Discharge Diagnoses:    Patient Active Problem List  Diagnoses  . FIBROIDS, UTERUS  . THYROID NODULE, RIGHT  . DM w/o Complication Type II  . DIABETIC PERIPHERAL NEUROPATHY  . HYPERLIPIDEMIA  . HYPERTENSION  . GERD  . CONSTIPATION  . UTI  . Acute low back pain due to trauma  . OSTEOPOROSIS NOS  . MEMORY LOSS  . Chronic bronchitis NEC  . ABDOMINAL BLOATING  . SPRAIN&STRAIN OTHER SPECIFIED SITES KNEE&LEG  . LACERATION OF FINGER  . URINARY INCONTINENCE, STRESS, FEMALE  . PPD positive  . Chronic insomnia  . Compression fracture of L2 lumbar vertebra    Medication List  As of 06/18/2011 12:09 PM   STOP taking these medications         diphenhydrAMINE 25 MG tablet      metFORMIN 500 MG tablet      oxyCODONE-acetaminophen 5-325 MG per tablet      valsartan 320 MG tablet         TAKE these medications         alendronate 70 MG tablet   Commonly known as: FOSAMAX   Take 1 tablet (70 mg total) by mouth every 7 (seven) days. Take with a full glass of water on an empty stomach.      amLODipine 2.5 MG tablet   Commonly known as: NORVASC   Take 1 tablet (2.5 mg total) by mouth daily.      aspirin EC 81 MG tablet   Take 81 mg by mouth daily.      atorvastatin 10 MG tablet   Commonly known as: LIPITOR   Take 10 mg by mouth daily.      benzonatate 100 MG capsule   Commonly known as: TESSALON   Take 1 capsule (100 mg total) by mouth 2 (two) times daily.      bisacodyl 10 MG suppository   Commonly known as: DULCOLAX   Place 1 suppository (10 mg total) rectally daily as needed.      calcium-vitamin D 500-200 MG-UNIT per tablet   Commonly known as: OSCAL WITH D   Take 1 tablet by mouth 2 (two) times daily.      FLUoxetine 10 MG capsule   Commonly known as: PROZAC   TAKE  1 CAPSULE DAILY      gabapentin 100 MG capsule   Commonly known as: NEURONTIN   Take 1 capsule (100 mg total) by mouth 2 (two) times daily.      glimepiride 2 MG tablet   Commonly known as: AMARYL   Take 1 tablet by mouth daily.      LEVEMIR FLEXPEN [place]   Inject 18 Units into the skin at bedtime.      levocetirizine 5 MG tablet   Commonly known as: XYZAL   Take 1 tablet (5 mg total) by mouth every evening.      levofloxacin 500 MG tablet   Commonly known as: LEVAQUIN   Take 1 tablet (500 mg total) by mouth daily.      losartan 100 MG tablet   Commonly known as: COZAAR   Take 1 tablet (100 mg total) by mouth daily.      mulitivitamin with minerals Tabs   Take 1 tablet by mouth daily.      niacin 1000 MG CR  tablet   Commonly known as: NIASPAN   Take 1,000 mg by mouth at bedtime.      omega-3 acid ethyl esters 1 G capsule   Commonly known as: LOVAZA   Take 2 capsules (2 g total) by mouth 2 (two) times daily.      omeprazole 20 MG capsule   Commonly known as: PRILOSEC   Take 1 capsule (20 mg total) by mouth daily.      oxyCODONE 5 MG immediate release tablet   Commonly known as: Oxy IR/ROXICODONE   Take 1 tablet (5 mg total) by mouth every 6 (six) hours as needed.      polyethylene glycol packet   Commonly known as: MIRALAX / GLYCOLAX   Take 17 g by mouth 2 (two) times daily.      senna 8.6 MG Tabs   Commonly known as: SENOKOT   Take 1 tablet (8.6 mg total) by mouth daily.      simvastatin 40 MG tablet   Commonly known as: ZOCOR   Take 40 mg by mouth every evening.      sitaGLIPtan-metformin 50-500 MG per tablet   Commonly known as: JANUMET   Take 1 tablet by mouth 2 (two) times daily with a meal.      VITAMIN E PO   Take 1 tablet by mouth daily.      zolpidem 10 MG tablet   Commonly known as: AMBIEN   Take 1 tablet (10 mg total) by mouth at bedtime as needed for sleep.      zolpidem 10 MG tablet   Commonly known as: AMBIEN   Take 1 tablet (10 mg total)  by mouth at bedtime as needed for sleep.      zolpidem 5 MG tablet   Commonly known as: AMBIEN   Take 1 tablet (5 mg total) by mouth at bedtime as needed for sleep. 1 or 2  At bedtime if needed      ZYRTEC PO   Take 1 tablet by mouth daily as needed. Patient took this medication for her allergies.             Disposition and Follow-up: Follow up with primary MD and with Dr Danielle Dess.  Consults:  Neurosurgery.  Barnett Abu, Neurosurgeon.  Significant Diagnostic Studies:  Ct Lumbar Spine Wo Contrast  06/07/2011  *RADIOLOGY REPORT*  Clinical Data: Fall.  Compression fracture.  Pain.  CT LUMBAR SPINE WITHOUT CONTRAST  Technique:  Multidetector CT imaging of the lumbar spine was performed without intravenous contrast administration. Multiplanar CT image reconstructions were also generated.  Comparison: Lumbar spine radiographs 06/06/2011.  Findings: The lumbar spine is imaged from midbody of T12 through S2.  Alignment is maintained.  Atherosclerotic calcifications are present in the aorta and branch vessels without aneurysm.  Limited imaging of the abdomen is otherwise unremarkable.  The superior endplate fracture of T12 is incompletely imaged.  By radiograph, this is remote on the prior studies.  The superior endplate compression fracture at L2 appears acute. There is slight retropulsion of bone along the superior endplate, measuring 4 mm.  There is no significant stenosis.  L2-3:  A leftward disc herniation results in mild left foraminal stenosis.  L3-4:  A leftward disc herniation results in mild left foraminal stenosis.  L4-5:  Mild broad-based disc herniation is present.  Facet hypertrophy is noted bilaterally.  This results in mild central and bilateral foraminal narrowing.  L5-S1:  A shallow central disc bulges present without significant stenosis.  IMPRESSION:  1.  Superior endplate compression fracture at L2 with 25% loss of height relative to the adjacent segment. 2.  Slight retropulsion of  bone along the superior endplate of L2 measures 4 mm without significant associated stenosis. 3.  Leftward disc herniations at L2-3 and L3-4 results in mild left foraminal narrowing. 4.  Broad-based disc herniation at L4-5 with mild central and bilateral foraminal narrowing.  Facet hypertrophy contributes. 5.  Shallow central disc protrusion at L5-S1 without significant stenosis. 6.  The superior endplate fracture at T12 is incompletely imaged.  Original Report Authenticated By: Jamesetta Orleans. MATTERN, M.D.    Brief H and P: For complete details, refer to admission H and P, however, in brief, this is an 77 y.o. female with history of chronic lower back pain, diabetes, hypertension, hyperlipidemia, referred to [facility], from  [facility] because of persistent back pain, following a mechanical fall that that occurred while she was holding a plate and fell on a hard cement floor. She was seen and was initially sent home on Percocet. As pain persisted, and she had difficulty ambulating, she represented. Evaluation included a LS-spine plain film showing compressive fracture of L2 there is new. A CT confirmed L2 endplate fracture with 25 percent compression, along with her mild retropulsion and foramininal stenosis with some mild disc herniation. She was admitted for further evaluation, investigation and management.  Physical Exam: On 06/18/11. General:   Alert, communicative, fully oriented, talking in complete sentences, not short of breath at rest.  HEENT:  No clinical pallor, no jaundice, no conjunctival injection or discharge. Hydration status is fair. NECK:  Supple, JVP not seen, no carotid bruits, no palpable lymphadenopathy, no palpable goiter. CHEST:  Clinically clear to auscultation, no wheezes, no crackles. HEART:  Sounds 1 and 2 heard, normal, regular, no murmurs. ABDOMEN:  Full, soft, no scars, non-tender, no palpable organomegaly, no palpable masses, normal bowel sounds. GENITALIA:  Not  examined. LOWER EXTREMITIES:  No pitting edema, palpable peripheral pulses. MUSCULOSKELETAL SYSTEM:  Generalized osteoarthritic changes, otherwise, normal. CENTRAL NERVOUS SYSTEM:  No focal neurologic deficit on gross examination.  Hospital Course:  1. Compression fracture of L2 lumbar vertebra (06/08/2011):   Patient presented as described above, with acute low back pain due to trauma (12/20/2008).  CT scan demonstrated a superior endplate compression fracture at L2 with 25% loss of height relative to the adjacent segment, as well as slight retropulsion of bone along the superior endplate of L2 measuring 4 mm without significant associated stenosis. Patient was managed with analgesics and mobilization, neurosurgical consultation was provided by Dr Barnett Abu and L2 acrylic balloon kyphoplasty was performed on 06/15/11, with dramatic results. Patient was able to tolerate physiotherapy, pain improved.  2. DM w/o Complication Type II (08/05/2006):  Patient was managed with diet, oral hypoglycemics SSI, and with satisfactory control. Metformin has been discontinued, due to age and borderline low CBGs. She will resume Determir insulin on discharge. 3. HYPERLIPIDEMIA (08/05/2006): Continueed on Lipitor, during her hospitalization..  4. HYPERTENSION (08/05/2006): Controlled on ARB and Norvasc.  5. OSTEOPOROSIS NOS (08/05/2006): In view of her fracture. Patient will benefit from Calcium and Vitamin D supplements, as well as bisphosphonate. 6. Query Pneumonia/Acute respiratory failure: Patient's chest x-ray showed possible infiltrate with increased cough and sputum, requiring O2, add nebs and Levaquin. Clinical response was satisfactory and patient is now saturating well over 90% on room air. Patient was transitioned to oral antibiotics on 06/16/11.  Comment: Stable for discharge in AM 06/18/11.  Time spent on Discharge: 45 mins.  Signed: Oshay Stranahan,CHRISTOPHER 06/18/2011, 12:09 PM

## 2011-06-17 NOTE — Progress Notes (Signed)
Clinical Social Worker received notification from Sports coach that home health agency met with pt and pt husband and pt and pt husband have now decided that they would like pt to go to SNF for short term rehab. Clinical Social Worker and Sports coach met with pt and pt husband at bedside to clarify questions and confirm plan. Clinical Social Worker provided bed offers and pt chooses [name] and requesting a private room. Clinical Social Worker contacted facility who confirmed bed availability for 4/25 and confirmed private room will be available for pt. Pt and pt husband aware and agreeable. MD notified. Clinical Social Worker to facilitate pt discharge needs when pt medically stable for discharge.  Jacklynn Lewis, MSW, LCSWA  Clinical Social Work [phone]

## 2011-06-17 NOTE — Progress Notes (Signed)
Agree with PTA discharge note.  Yanette Tripoli, PT DPT [phone]  

## 2011-06-17 NOTE — Progress Notes (Signed)
Subjective: Feels much better. Ambulated with PT. Back pain is much less.  Objective: Vital signs in last 24 hours: Temp:  [97.7 F (36.5 C)-98.5 F (36.9 C)] 98.3 F (36.8 C) (04/24 0955) Pulse Rate:  [75-94] 75  (04/24 0955) Resp:  [18-20] 18  (04/24 0955) BP: (97-158)/(53-84) 113/72 mmHg (04/24 0955) SpO2:  [92 %-96 %] 96 % (04/24 0955) Weight:  [67.8 kg (149 lb 7.6 oz)] 67.8 kg (149 lb 7.6 oz) (04/24 0600) Weight change: 2.1 kg (4 lb 10.1 oz) Last BM Date: 06/15/11  Intake/Output from previous day:       Physical Exam: General: Comfortable, alert, communicative, fully oriented, not short of breath at rest. Sitting in chair, eating with gusto. HEENT:  No clinical pallor, no jaundice, no conjunctival injection or discharge. NECK:  Supple, JVP not seen, no carotid bruits, no palpable lymphadenopathy, no palpable goiter. CHEST:  Clinically clear to auscultation, no wheezes, no crackles. HEART:  Sounds 1 and 2 heard, normal, regular, no murmurs. ABDOMEN:  Full, soft, non-tender, no palpable organomegaly, no palpable masses, normal bowel sounds. GENITALIA:  Not examined. LOWER EXTREMITIES:  No pitting edema, palpable peripheral pulses. MUSCULOSKELETAL SYSTEM:  Generalized osteoarthritic changes, otherwise, normal. CENTRAL NERVOUS SYSTEM:  No focal neurologic deficit on gross examination.  Lab Results: No results found for this basename: WBC:2,HGB:2,HCT:2,PLT:2 in the last 72 hours  Basename 06/15/11 0608  NA --  K --  CL --  CO2 --  GLUCOSE --  BUN --  CREATININE 0.59  CALCIUM --   No results found for this or any previous visit (from the past 240 hour(s)).   Studies/Results: Dg Lumbar Spine 2-3 Views  06/15/2011  *RADIOLOGY REPORT*  Clinical Data: L2 kyphoplasty  LUMBAR SPINE - 2-3 VIEW  Comparison: CT 06/07/2011 at the [facility]  Findings: Two intraprocedural fluoroscopic images are provided, indicating cement placement at the site of  previously seen L2 compression fracture.  IMPRESSION: L2 kyphoplasty as above.  Original Report Authenticated By: Harrel Lemon, M.D.   Dg Swallowing Func-no Report  06/16/2011  CLINICAL DATA: dysphagia   FLUOROSCOPY FOR SWALLOWING FUNCTION STUDY:  Fluoroscopy was provided for swallowing function study, which was  administered by a speech pathologist.  Final results and recommendations  from this study are contained within the speech pathology report.      Medications: Scheduled Meds:   . amLODipine  2.5 mg Oral Daily  . aspirin EC  81 mg Oral Daily  . atorvastatin  10 mg Oral Daily  . benzonatate  100 mg Oral BID  . docusate sodium  100 mg Oral BID  . enoxaparin  40 mg Subcutaneous Q24H  . FLUoxetine  10 mg Oral Daily  . gabapentin  100 mg Oral BID  . glimepiride  2 mg Oral Daily  . insulin aspart  0-15 Units Subcutaneous TID WC  . insulin aspart  0-5 Units Subcutaneous QHS  . insulin aspart  5 Units Subcutaneous TID WC  . insulin glargine  20 Units Subcutaneous QHS  . levofloxacin  500 mg Oral Daily  . linagliptin  5 mg Oral Daily  . loratadine  10 mg Oral Daily  . niacin  1,000 mg Oral QHS  . olmesartan  40 mg Oral Daily  . omega-3 acid ethyl esters  2 g Oral BID  . pantoprazole  40 mg Oral Q1200  . polyethylene glycol  17 g Oral BID  . senna  1 tablet Oral Daily  . sodium  chloride  3 mL Intravenous Q12H   Continuous Infusions:   . sodium chloride     PRN Meds:.acetaminophen, acetaminophen, acetaminophen, albuterol, bisacodyl, HYDROmorphone, menthol-cetylpyridinium, ondansetron (ZOFRAN) IV, ondansetron (ZOFRAN) IV, ondansetron, oxyCODONE, phenol, sodium chloride, zolpidem  Assessment/Plan: 1. Compression fracture of L2 lumbar vertebra (06/08/2011):   Patient presented as described above, with acute low back pain due to trauma (12/20/2008).  CT scan demonstrated a superior endplate compression fracture at L2 with 25% loss of height relative to the adjacent segment, as  well as slight retropulsion of bone along the superior endplate of L2 measuring 4 mm without significant associated stenosis. Patient was managed with analgesics and mobilization, neurosurgical consultation was provided by Dr Barnett Abu and L2 acrylic balloon kyphoplasty was performed on 06/15/11, with dramatic results. Patient was able to tolerate physiotherapy, pain improved.  2. DM w/o Complication Type II (08/05/2006):  Patient was managed with diet, oral hypoglycemics and SSI, with satisfactory control. Metformin has been discontinued, due to age and borderline low CBGs. 3. HYPERLIPIDEMIA (08/05/2006): Continued on Lipitor, during her hospitalization..  4. HYPERTENSION (08/05/2006): Controlled on ARB and Norvasc.  5. OSTEOPOROSIS NOS (08/05/2006): In view opf her fracture. Patient will benefit from cCalcium and Vitamin D supplements, as well as bisphosphonate. 6. Query Pneumonia/Acute respiratory failure: Patient's chest x-ray showed possible infiltrate with increased cough and sputum, requiring O2, add nebs and Levaquin. Clinical response was satisfactory and patient is now saturating well over 90% on room air. Patient was transitioned to oral antibiotics on 06/16/11.  Comment: Aiming discharge on 06/18/11.   LOS: 10 days   Arseniy Toomey,CHRISTOPHER 06/17/2011, 2:15 PM

## 2011-06-17 NOTE — Progress Notes (Signed)
Physical Therapy Treatment Patient Details Name: Lenya Sterne MRN: [id number] DOB: 1934-06-02 Today's Date: 06/17/2011 Time: 4098-1191 PT Time Calculation (min): 20 min  PT Assessment / Plan / Recommendation Comments on Treatment Session  Patient progressed well. Goal met and will DC from acute PT services.     Follow Up Recommendations  Home health PT;Supervision - Intermittent    Equipment Recommendations  Tub/shower seat    Frequency     Plan All goals met and education completed, patient dischaged from PT services    Precautions / Restrictions Precautions Precautions: None   Pertinent Vitals/Pain     Mobility  Bed Mobility Rolling Right: 7: Independent Right Sidelying to Sit: 7: Independent Sitting - Scoot to Edge of Bed: 7: Independent Transfers Sit to Stand: 6: Modified independent (Device/Increase time) Stand to Sit: 6: Modified independent (Device/Increase time) Ambulation/Gait Ambulation/Gait Assistance: 6: Modified independent (Device/Increase time) Ambulation Distance (Feet): 350 Feet Assistive device: Rolling walker Gait Pattern: Within Functional Limits Stairs: Yes Stairs Assistance: 6: Modified independent (Device/Increase time) Stair Management Technique: Two rails Number of Stairs: 4     Exercises     PT Goals Acute Rehab PT Goals PT Goal: Rolling Supine to Right Side - Progress: Met PT Goal: Rolling Supine to Left Side - Progress: Met PT Goal: Supine/Side to Sit - Progress: Met PT Goal: Sit to Supine/Side - Progress: Met PT Goal: Sit to Stand - Progress: Met PT Goal: Stand to Sit - Progress: Met PT Transfer Goal: Bed to Chair/Chair to Bed - Progress: Met PT Goal: Ambulate - Progress: Met PT Goal: Up/Down Stairs - Progress: Met  Visit Information  Last PT Received On: 06/17/11 Assistance Needed: +1    Subjective Data      Cognition  Overall Cognitive Status: Appears within functional limits for tasks assessed/performed Arousal/Alertness:  Awake/alert Orientation Level: Appears intact for tasks assessed Behavior During Session: [facility] for tasks performed    Balance     End of Session PT - End of Session Equipment Utilized During Treatment: Gait belt Activity Tolerance: Patient tolerated treatment well Patient left: in chair    Landin Tallon, Adline Potter 06/17/2011, 1:42 PM 06/17/2011 Fredrich Birks PTA [phone] pager [phone] office

## 2011-06-17 NOTE — Progress Notes (Signed)
Clinical Social Worker and Sports coach met with pt at bedside to discuss pt discharge needs. Clinical Social Worker offered to give SNF bed offers, but pt stated that she is "feeling much better today" and would like to return home with home health services. Pt confirmed that pt daughter and pt husband are able to provide 24 hour supervision. Case Manager assisting with arranging home health needs. No further social work need identified at this time. Clinical Social Worker signing off.   Jacklynn Lewis, MSW, LCSWA  Clinical Social Work [phone]

## 2011-06-17 NOTE — Progress Notes (Signed)
06/17/2011 [facility], Bosie Clos SPARKS Case Management Note [phone]    CARE MANAGEMENT NOTE 06/17/2011  Patient:  Mary Flynn, Mary Flynn   Account Number:  [id number]  Date Initiated:  06/09/2011  Documentation initiated by:  Fransico Michael  Subjective/Objective Assessment:   admitted on 06/07/11 with persistant back pain.     Action/Plan:   lives at home with spouse    kyphoplasty   Anticipated DC Date:  06/17/2011   Anticipated DC Plan:  HOME W HOME HEALTH SERVICES      DC Planning Services  CM consult      PAC Choice  DURABLE MEDICAL EQUIPMENT  HOME HEALTH   Choice offered to / List presented to:  C-1 Patient   DME arranged  [facility]      DME agency  [facility]     HH arranged  HH-2 PT  HH-3 OT      [facility] agency  [facility]   Status of service:  Completed, signed off Medicare Important Message given?   (If response is "NO", the following Medicare IM given date fields will be blank) Date Medicare IM given:   Date Additional Medicare IM given:    Discharge Disposition:  HOME W HOME HEALTH SERVICES  Per UR Regulation:  Reviewed for med. necessity/level of care/duration of stay  If discussed at Long Length of Stay Meetings, dates discussed:    Comments:  PCP: Chester HolsteinAUBURN, HESTER (spouse) [phone]  06/17/11-1459-J.Claudia Greenley,RN,BSN [phone]      Received call from [facility] RN with notification that patient is now wanting SNF for short term rehab again. In to see patient and husband with Selena Lesser, CSW regarding disposition. Patient has chosen to go to [facility] and rehab tomorrow for short term rehab.  06/17/11-1057-J.Adie Vilar,RN,BSN [phone]      In to see patient regarding choice of agencies for home health PT/OT. [facility]. Mary, RN with [facility] notified of referral. Also notified of need for rolling walker. No further discharge/home needs identified. Plan for patient to be discharged home with  home care today.  06/15/11-1239-J.Dorianna Mckiver,RN,BSN [phone]      Kyphoplasty scheduled for this past Friday, was postponed until today. Noted PT evaluation and recommendation for home health PT with 24 hour supervision. CM will talk with patient tomorrow regarding family availability after discharge.

## 2011-06-17 NOTE — Progress Notes (Signed)
06/17/2011 [facility], Bosie Clos SPARKS Case Management Note [phone]    CARE MANAGEMENT NOTE 06/17/2011  Patient:  Mary Flynn, Mary Flynn   Account Number:  [id number]  Date Initiated:  06/09/2011  Documentation initiated by:  Fransico Michael  Subjective/Objective Assessment:   admitted on 06/07/11 with persistant back pain.     Action/Plan:   lives at home with spouse    kyphoplasty   Anticipated DC Date:  06/17/2011   Anticipated DC Plan:  HOME W HOME HEALTH SERVICES      DC Planning Services  CM consult      PAC Choice  DURABLE MEDICAL EQUIPMENT  HOME HEALTH   Choice offered to / List presented to:  C-1 Patient   DME arranged  [facility]      DME agency  [facility]     HH arranged  HH-2 PT  HH-3 OT      [facility] agency  [facility]   Status of service:  Completed, signed off Medicare Important Message given?   (If response is "NO", the following Medicare IM given date fields will be blank) Date Medicare IM given:   Date Additional Medicare IM given:    Discharge Disposition:  HOME W HOME HEALTH SERVICES  Per UR Regulation:  Reviewed for med. necessity/level of care/duration of stay  If discussed at Long Length of Stay Meetings, dates discussed:    Comments:  PCP: Chester HolsteinDeneise Flynn (spouse) [phone]  06/17/11-1057-J.Altair Stanko,RN,BSN [phone]      In to see patient regarding choice of agencies for home health PT/OT. [facility]. Mary, RN with [facility] notified of referral. Also notified of need for rolling walker. No further discharge/home needs identified. Plan for patient to be discharged home with home care today.  06/15/11-1239-J.Corey Laski,RN,BSN [phone]      Kyphoplasty scheduled for this past Friday, was postponed until today. Noted PT evaluation and recommendation for home health PT with 24 hour supervision. CM will talk with patient tomorrow regarding family availability after discharge.

## 2011-06-18 LAB — BASIC METABOLIC PANEL
CO2: 26 mEq/L (ref 19–32)
Chloride: 97 mEq/L (ref 96–112)
Glucose, Bld: 197 mg/dL — ABNORMAL HIGH (ref 70–99)
Potassium: 3.8 mEq/L (ref 3.5–5.1)
Sodium: 135 mEq/L (ref 135–145)

## 2011-06-18 LAB — CBC
HCT: 34.3 % — ABNORMAL LOW (ref 36.0–46.0)
Hemoglobin: 11.8 g/dL — ABNORMAL LOW (ref 12.0–15.0)
RBC: 4.23 MIL/uL (ref 3.87–5.11)

## 2011-06-18 LAB — GLUCOSE, CAPILLARY: Glucose-Capillary: 172 mg/dL — ABNORMAL HIGH (ref 70–99)

## 2011-06-18 MED ORDER — BENZONATATE 100 MG PO CAPS: 100.0000 mg | ORAL_CAPSULE | Freq: Two times a day (BID) | ORAL | Status: AC

## 2011-06-18 MED ORDER — BISACODYL 10 MG RE SUPP: 10.0000 mg | Freq: Every day | RECTAL | Status: AC | PRN

## 2011-06-18 MED ORDER — ZOLPIDEM TARTRATE 5 MG PO TABS: 5.0000 mg | ORAL_TABLET | Freq: Every evening | ORAL | Status: DC | PRN

## 2011-06-18 MED ORDER — POLYETHYLENE GLYCOL 3350 17 G PO PACK: 17.0000 g | PACK | Freq: Two times a day (BID) | ORAL | Status: DC

## 2011-06-18 MED ORDER — CALCIUM CARBONATE-VITAMIN D 500-200 MG-UNIT PO TABS: 1.0000 | ORAL_TABLET | Freq: Two times a day (BID) | ORAL | Status: DC

## 2011-06-18 MED ORDER — OXYCODONE HCL 5 MG PO TABS: 5.0000 mg | ORAL_TABLET | Freq: Four times a day (QID) | ORAL | Status: AC | PRN

## 2011-06-18 MED ORDER — LEVOFLOXACIN 500 MG PO TABS: 500.0000 mg | ORAL_TABLET | Freq: Every day | ORAL | Status: AC

## 2011-06-18 MED ORDER — SENNA 8.6 MG PO TABS: 1.0000 | ORAL_TABLET | Freq: Every day | ORAL | Status: DC

## 2011-06-18 NOTE — Progress Notes (Signed)
Clinical Social Worker facilitated pt discharge needs including contacting facility, speaking with pt and pt family at bedside. Pt discharging to [facility] by pt husband by car. Pt and pt family appreciative of Clinical Social Worker assistance. No further social work needs identified at this time.  Jacklynn Lewis, MSW, LCSWA  Clinical Social Work [phone]

## 2011-06-18 NOTE — Progress Notes (Signed)
D/c instructions provided. Pt under no s/s distress.

## 2011-06-23 DIAGNOSIS — J811 Chronic pulmonary edema: Secondary | ICD-10-CM | POA: Diagnosis not present

## 2011-06-23 DIAGNOSIS — R05 Cough: Secondary | ICD-10-CM | POA: Diagnosis not present

## 2011-06-23 DIAGNOSIS — R109 Unspecified abdominal pain: Secondary | ICD-10-CM | POA: Diagnosis not present

## 2011-07-02 ENCOUNTER — Telehealth: Payer: Self-pay | Admitting: *Deleted

## 2011-07-02 NOTE — Telephone Encounter (Signed)
Pt BP today 184/82, Pt was asymptomatic per nurse. Pt just came home from hosp.

## 2011-07-02 NOTE — Telephone Encounter (Signed)
Discussed with patient and she has an apt tomorrow.     KP

## 2011-07-02 NOTE — Telephone Encounter (Signed)
con't to monitor and see if it comes down She would need ov if not

## 2011-07-03 ENCOUNTER — Ambulatory Visit (INDEPENDENT_AMBULATORY_CARE_PROVIDER_SITE_OTHER): Payer: Medicare Other | Admitting: Family Medicine

## 2011-07-03 ENCOUNTER — Encounter: Payer: Self-pay | Admitting: Family Medicine

## 2011-07-03 VITALS — BP 124/68 | HR 75 | Temp 98.5°F | Wt 138.6 lb

## 2011-07-03 DIAGNOSIS — R05 Cough: Secondary | ICD-10-CM

## 2011-07-03 DIAGNOSIS — R21 Rash and other nonspecific skin eruption: Secondary | ICD-10-CM

## 2011-07-03 MED ORDER — MOMETASONE FUROATE 0.1 % EX CREA: TOPICAL_CREAM | Freq: Every day | CUTANEOUS | Status: AC

## 2011-07-03 MED ORDER — FLUTICASONE-SALMETEROL 250-50 MCG/DOSE IN AEPB: 1.0000 | INHALATION_SPRAY | Freq: Two times a day (BID) | RESPIRATORY_TRACT | Status: DC

## 2011-07-03 MED ORDER — GUAIFENESIN-CODEINE 100-10 MG/5ML PO SYRP: ORAL_SOLUTION | ORAL | Status: DC

## 2011-07-03 NOTE — Progress Notes (Signed)
  Subjective:     Mary Flynn is a 77 y.o. female here for evaluation of a cough. Onset of symptoms was several years ago. Symptoms have been unchanged since that time. The cough is dry and is aggravated by cold air, exercise, pollens and reclining position. Associated symptoms include: postnasal drip, shortness of breath and sputum production. Patient does not have a history of asthma. Patient does have a history of environmental allergens. Patient has traveled recently. Patient does not have a history of smoking. Patient has had a previous chest x-ray. Patient has not had a PPD done.  The following portions of the patient's history were reviewed and updated as appropriate: allergies, current medications, past family history, past medical history, past social history, past surgical history and problem list.  Review of Systems Pertinent items are noted in HPI.    Objective:    Oxygen saturation 97% on room air BP 124/68  Pulse 75  Temp(Src) 98.5 F (36.9 C) (Oral)  Wt 138 lb 9.6 oz (62.869 kg)  SpO2 97% General appearance: alert, cooperative, appears stated age and no distress Head: Normocephalic, without obvious abnormality, atraumatic Nose: Nares normal. Septum midline. Mucosa normal. No drainage or sinus tenderness. Throat: lips, mucosa, and tongue normal; teeth and gums normal Neck: no adenopathy, no carotid bruit, no JVD, supple, symmetrical, trachea midline and thyroid not enlarged, symmetric, no tenderness/mass/nodules Lungs: clear to auscultation bilaterally    Assessment:    Reactive Airway Disease    Plan:    Explained lack of efficacy of antibiotics in viral disease. Antitussives per medication orders. Avoid exposure to tobacco smoke and fumes. Call if shortness of breath worsens, blood in sputum, change in character of cough, development of fever or chills, inability to maintain nutrition and hydration. Avoid exposure to tobacco smoke and fumes. Steroid inhaler as  ordered.

## 2011-07-03 NOTE — Patient Instructions (Signed)

## 2011-07-06 ENCOUNTER — Telehealth: Payer: Self-pay | Admitting: Family Medicine

## 2011-07-06 MED ORDER — ALENDRONATE SODIUM 70 MG PO TABS: 70.0000 mg | ORAL_TABLET | ORAL | Status: DC

## 2011-07-06 NOTE — Telephone Encounter (Signed)
Patient wants to go back to taking Protonix 40 mg daily for a 90 day supply to [facility]. Please advise    KP

## 2011-07-06 NOTE — Telephone Encounter (Signed)
Please call regarding Prilosec, patient called and would like to speak with you  Patient ph# [phone]

## 2011-07-06 NOTE — Telephone Encounter (Signed)
Ok #90 3 refills---1 po qd

## 2011-07-07 MED ORDER — PANTOPRAZOLE SODIUM 40 MG PO TBEC: 40.0000 mg | DELAYED_RELEASE_TABLET | Freq: Every day | ORAL | Status: DC

## 2011-07-08 ENCOUNTER — Telehealth: Payer: Self-pay | Admitting: Internal Medicine

## 2011-07-08 DIAGNOSIS — S32009A Unspecified fracture of unspecified lumbar vertebra, initial encounter for closed fracture: Secondary | ICD-10-CM

## 2011-07-08 DIAGNOSIS — I10 Essential (primary) hypertension: Secondary | ICD-10-CM

## 2011-07-08 DIAGNOSIS — E119 Type 2 diabetes mellitus without complications: Secondary | ICD-10-CM

## 2011-07-08 DIAGNOSIS — R279 Unspecified lack of coordination: Secondary | ICD-10-CM

## 2011-07-08 DIAGNOSIS — M6281 Muscle weakness (generalized): Secondary | ICD-10-CM

## 2011-07-08 NOTE — Telephone Encounter (Signed)
CALLED AND SPOKE WITH PATIENT . Patient requesting an rx for  HYCODAN COUGH SYRUP  1-2 tsp prn cough. 90 day supply .  States that she is having a productive cough (clear) runny nose ,chest congestion Unable to rest at night.denies any fever. Would like this sent to mail order .  Allergies  Allergen Reactions  . Penicillins   . Sulfonamide Derivatives    Dr Maple Hudson please advise .  Thank you

## 2011-07-09 ENCOUNTER — Ambulatory Visit: Payer: Medicare Other | Admitting: Family Medicine

## 2011-07-09 NOTE — Telephone Encounter (Signed)
Per CY-okay to give refill this time only

## 2011-07-09 NOTE — Telephone Encounter (Signed)
200 ml is not 90 day supply. I called the pt to see what other retial pharm we can send rx to. LMTCB x 1

## 2011-07-09 NOTE — Telephone Encounter (Signed)
Per CY-okay to give #270ml.

## 2011-07-09 NOTE — Telephone Encounter (Signed)
Please advise how much you want to give since they are asking for 90 day supply and this is not something we normally prescribe for 90 days, thanks

## 2011-07-10 ENCOUNTER — Telehealth: Payer: Self-pay

## 2011-07-10 ENCOUNTER — Telehealth: Payer: Self-pay | Admitting: Internal Medicine

## 2011-07-10 MED ORDER — HYDROCODONE-HOMATROPINE 5-1.5 MG/5ML PO SYRP: ORAL_SOLUTION | ORAL | Status: DC

## 2011-07-10 NOTE — Telephone Encounter (Signed)
Spoke with patient-states she is using cough syrup as directed; having deep cough, sore throat and wants OV only with CY. Pt is on schedule for Tuesday at 145 pm as acute visit.

## 2011-07-10 NOTE — Telephone Encounter (Signed)
I made patient aware that the Rx was already sent by Dr.Youngs office and she voiced understanding, she was not aware that they had sent it. I advised patient o call te pharmacy and make sure it is ready and she agreed to do so.      KP

## 2011-07-10 NOTE — Telephone Encounter (Signed)
Call from patient and she stated the cough medication is not working and she would like to go back to the other medication. She has not been sleeping due to the cough. Please advise     KP

## 2011-07-10 NOTE — Telephone Encounter (Signed)
I spoke with the pt and advised. She wants rx sent to [facility]. Rx called in. Carron Curie, CMA

## 2011-07-10 NOTE — Telephone Encounter (Signed)
She just got some from pulmonary

## 2011-07-12 DIAGNOSIS — I10 Essential (primary) hypertension: Secondary | ICD-10-CM

## 2011-07-12 DIAGNOSIS — E119 Type 2 diabetes mellitus without complications: Secondary | ICD-10-CM

## 2011-07-12 DIAGNOSIS — R279 Unspecified lack of coordination: Secondary | ICD-10-CM

## 2011-07-12 DIAGNOSIS — M6281 Muscle weakness (generalized): Secondary | ICD-10-CM

## 2011-07-13 ENCOUNTER — Other Ambulatory Visit: Payer: Self-pay | Admitting: Family Medicine

## 2011-07-14 ENCOUNTER — Ambulatory Visit (INDEPENDENT_AMBULATORY_CARE_PROVIDER_SITE_OTHER): Payer: Medicare Other | Admitting: Internal Medicine

## 2011-07-14 ENCOUNTER — Ambulatory Visit (INDEPENDENT_AMBULATORY_CARE_PROVIDER_SITE_OTHER)
Admission: RE | Admit: 2011-07-14 | Discharge: 2011-07-14 | Disposition: A | Discharge status: Home / Self Care | Payer: Medicare Other | Source: Ambulatory Visit | Attending: Internal Medicine | Admitting: Internal Medicine

## 2011-07-14 ENCOUNTER — Encounter: Payer: Self-pay | Admitting: Internal Medicine

## 2011-07-14 VITALS — BP 126/60 | HR 80 | Ht 59.0 in | Wt 141.2 lb

## 2011-07-14 DIAGNOSIS — J42 Unspecified chronic bronchitis: Secondary | ICD-10-CM

## 2011-07-14 DIAGNOSIS — G4700 Insomnia, unspecified: Secondary | ICD-10-CM

## 2011-07-14 DIAGNOSIS — F5104 Psychophysiologic insomnia: Secondary | ICD-10-CM

## 2011-07-14 DIAGNOSIS — R05 Cough: Secondary | ICD-10-CM

## 2011-07-14 DIAGNOSIS — R7611 Nonspecific reaction to tuberculin skin test without active tuberculosis: Secondary | ICD-10-CM

## 2011-07-14 NOTE — Progress Notes (Signed)
Patient ID: Mary Flynn, female    DOB: Apr 03, 1934, 77 y.o.   MRN: [id number]  HPI 08/19/10- 32 yoF here with husband on kind referral by Dr Laury Axon because of cough. Chronic nonproductive cough with indefinite date of onset has been present for years as an adult. She describes previous evaluation at [facility], By Dr [facility], and by Dr Sherene Sires Pulmonary. At one point cough resolved spontaneously and abruptly, was gone for a year, returned for 6 months, was gone for 3 months and has been back now x 4 months. Workup including allergy testing,barium swallow,  24hour pH probe nonrevealing. Denies pattern of season or climate. Hydrocodone permits sleep. Can lie supine, but coughs on either side. Failed inhalers, home remedies. Nuts, sweets, colas and spices seem to make cough worse. Talking and stress make her cough. She blames dust from her telephone.  Sips of hot water and cough drops seem to help. Now taking Xyzal. Swims daily,  02/12/11- 76 yoF never smoker followed for chronic cough. Here with husband. Since last here, went to [place] for "[place] medicine" then told her it wouldn't work without meat- she is vegetarian.  She coughs with exposure to spices, cold, sweets. Cough if food is in mouth. Some variable irritation in back of throat. Helps to sip very hot liquids. Coughs until she retches episodically. Treated narrow angle glaucoma discussed w/ consideration of sustained use of Spiriva. Sample trial of Spiriva helped a little.  PFT 08/31/09- normal except DLCO 45%. No hx of DVT or CHF.  04/17/11- 76 yoF never smoker followed for chronic cough. + PPD Here with husband She came today just to have PPD read:   POSITIVE PPD  16 mm induration and erythema L arm.  Continues chronic cough, scant white sputum. No fever/ sweats. She doesn't know of TB exposure or BCG vaccination- grew up in [place]. Never treated for TB.  Has been travelling back and forth to [facility] to see ill son- he has just expired.  CXR 02/12/11-  images reviewed. Linear scarring with no granulomatous disease   04/22/11- 76 yoF never smoker followed for chronic cough. + PPD, glaucoma.        Here with husband Sputum for AFB 04/20/11- smear NEG, Cx pending Again going to [place] to close up the state of her son who died. Cough disturbs sleep. Aggravates insomnia which has been a problem from childhood. We discussed sleep hygiene. Cough is either dry or productive of thick white to yellow sputum. Coughs more when stressed. We discussed possible role of reflux and she will watch for that. Denies fever, sweat, adenopathy, chest pain.  07/14/11- 71 yoF never smoker followed for chronic cough. + PPD, glaucoma.        Here with husband Acute visit:dry cough at times, deep cough, watery eyes, alot of mucus coming up-no color for the most part, sore throat. Denies any SOB, has rattles in chest-using Advair and cant tell any difference, as well as using Asmanex. Using cough syrup-still having the same deep cough and unable to sleep. Since last here she had back surgery without complication. She associates increased cough with "stress". Has had swallowing evaluations in the past but has not seen Dr. Marina Goodell recently. Using hydrocodone cough syrup twice daily- helps the most. She says that anything in her mouth except hot water triggers cough. Says that she has failed benzonatate, tramadol, Asmanex, Advair-none having any benefit for her cough. Dr.Lowne changed  omeprazole to protonix. She again refers  to insomnia she says she has had since childhood. Cough is waking her only occasionally, without recognized reflux events. CXR 06/11/11- images reviewed IMPRESSION:  There is worsening bilateral scarring or atelectasis. There is  probable superimposed airspace disease in the right base suspicious  for infiltrate. Follow-up to resolution after treatment is  recommended.  Original Report Authenticated By: Natasha Mead, M.D.   Review of Systems from 12/20-  not reviewed 04/17/11 Constitutional:   No-   weight loss, night sweats, fevers, chills, fatigue, lassitude. HEENT:   No-  headaches, difficulty swallowing, tooth/dental problems, sore throat,       No-  sneezing, itching, ear ache, nasal congestion, post nasal drip,  CV:  No-   chest pain, orthopnea, PND, swelling in lower extremities, anasarca, dizziness, palpitations Resp: No- acute  shortness of breath with exertion or at rest.              +   productive cough,  + non-productive cough,  No- coughing up of blood.              No-   change in color of mucus.  No- wheezing.   Skin: No-   rash or lesions. GI:  No-   heartburn, indigestion, abdominal pain, nausea, vomiting,  GU:  MS:  No-   joint pain or swelling.  .  . Neuro-     nothing unusual Psych:  No- change in mood or affect. + depression or anxiety.  No memory loss.  Objective:  General- Alert, Oriented, Affect-appropriate, Distress- none acute, over weight, steady talker Skin- rash-none, lesions- none, excoriation- none Lymphadenopathy- none Head- atraumatic            Eyes- Gross vision intact, PERRLA, conjunctivae clear secretions            Ears- Hearing, canals-normal            Nose- Clear, no-Septal dev, mucus, polyps, erosion, perforation             Throat- Mallampati III , mucosa-mild cobblestone not well visualized because of soft palate , drainage- none, tonsils- atrophic Neck- flexible , trachea midline, no stridor , thyroid nl, carotid no bruit Chest - symmetrical excursion , unlabored           Heart/CV- RRR , no murmur , no gallop  , no rub, nl s1 s2                           - JVD- none , edema- none, stasis changes- none, varices- none           Lung- clear to P&A, wheeze- none, cough- none while here today, dullness-none, rub- none           Chest wall-  Abd-  Br/ Gen/ Rectal- Not done, not indicated Extrem- cyanosis- none, clubbing, none, atrophy- none, strength- nl Neuro- grossly intact to  observation

## 2011-07-14 NOTE — Patient Instructions (Addendum)
Order- CXR      Dx chronic cough  Sample tudorza   1 puff, twice daily  Call for refill of hydrocodone cough syrup when needed

## 2011-07-16 ENCOUNTER — Telehealth: Payer: Self-pay | Admitting: Internal Medicine

## 2011-07-16 ENCOUNTER — Ambulatory Visit: Payer: Medicare Other | Admitting: Family Medicine

## 2011-07-16 ENCOUNTER — Encounter: Payer: Self-pay | Admitting: Family Medicine

## 2011-07-16 ENCOUNTER — Ambulatory Visit (INDEPENDENT_AMBULATORY_CARE_PROVIDER_SITE_OTHER): Payer: Medicare Other | Admitting: Family Medicine

## 2011-07-16 VITALS — BP 110/80 | HR 71 | Temp 97.7°F | Wt 138.0 lb

## 2011-07-16 DIAGNOSIS — J309 Allergic rhinitis, unspecified: Secondary | ICD-10-CM

## 2011-07-16 DIAGNOSIS — F329 Major depressive disorder, single episode, unspecified: Secondary | ICD-10-CM

## 2011-07-16 DIAGNOSIS — J302 Other seasonal allergic rhinitis: Secondary | ICD-10-CM

## 2011-07-16 DIAGNOSIS — K219 Gastro-esophageal reflux disease without esophagitis: Secondary | ICD-10-CM

## 2011-07-16 DIAGNOSIS — R05 Cough: Secondary | ICD-10-CM

## 2011-07-16 DIAGNOSIS — F32A Depression, unspecified: Secondary | ICD-10-CM | POA: Insufficient documentation

## 2011-07-16 MED ORDER — MIRTAZAPINE 7.5 MG PO TABS: 7.5000 mg | ORAL_TABLET | Freq: Every day | ORAL | Status: DC

## 2011-07-16 MED ORDER — GUAIFENESIN-CODEINE 100-10 MG/5ML PO SYRP: 5.0000 mL | ORAL_SOLUTION | Freq: Three times a day (TID) | ORAL | Status: AC | PRN

## 2011-07-16 NOTE — Telephone Encounter (Signed)
LMTCB

## 2011-07-16 NOTE — Telephone Encounter (Signed)
Per CY-stop Tudorza and see Dr Marina Goodell.

## 2011-07-16 NOTE — Assessment & Plan Note (Signed)
Probable cause of cough con't protonix F/u GI

## 2011-07-16 NOTE — Assessment & Plan Note (Signed)
D/c prozac remeron 7.5 mg 1 po qhs

## 2011-07-16 NOTE — Telephone Encounter (Signed)
I spoke with pt and he states when she uses her tudorza it caused her to cough and vomit. She states she has tried this 4 times already and each time she did this. She states anytime she uses an inhaler the powder causes her to do this. She doesn't;t understand why it irritates her throat so much right when she does the "inhaler". Please advise Dr. Maple Hudson, thanks  Allergies  Allergen Reactions  . Penicillins   . Sulfonamide Derivatives

## 2011-07-16 NOTE — Patient Instructions (Signed)

## 2011-07-16 NOTE — Progress Notes (Signed)
  Subjective:    Patient ID: Mary Flynn, female    DOB: 27-Nov-1934, 77 y.o.   MRN: [id number]  HPI Pt here to f/u with her cough that is no better.  She saw pulmonary and was put on tudorza which pt did not like so she stopped it.  Advair did not help either---she thought it had before.   Pt also c/o depression since her sons death and would like to be treated---the prozac is not working and see a Veterinary surgeon.  Pt is not suicidal.  She has a decreased appetite and is not sleeping well.     Review of Systems As above    Objective:   Physical Exam  Constitutional: She is oriented to person, place, and time. She appears well-developed and well-nourished.  Cardiovascular: Normal rate and regular rhythm.   Pulmonary/Chest: Effort normal and breath sounds normal.  Neurological: She is alert and oriented to person, place, and time.  Psychiatric: Judgment and thought content normal.       Sad affect Teary Not suicidal or homocidal          Assessment & Plan:

## 2011-07-17 NOTE — Telephone Encounter (Signed)
I spoke with pt and is aware of CDY recs. She voiced her understanding and had no question

## 2011-07-18 NOTE — Assessment & Plan Note (Signed)
As a favor an important component of GERD with cyclical cough. Chest x-ray picture would be consistent with episodic aspiration. Need to watch closely for other explanations including progressive parenchymal disease. Plan-sample Tudorza after discussion of her glaucoma and request that she check with her ophthalmologist. Discussion of hydrocodone cough syrup.

## 2011-07-18 NOTE — Assessment & Plan Note (Signed)
She is denying productive cough, night sweats, weight loss and chest x-ray picture does not suggest pulmonary tuberculosis. Consider repeat sputum culture watching for atypical AFB.

## 2011-07-18 NOTE — Assessment & Plan Note (Signed)
Plan-Ambien education done

## 2011-07-21 ENCOUNTER — Telehealth: Payer: Self-pay | Admitting: Family Medicine

## 2011-07-21 NOTE — Telephone Encounter (Signed)
msg left to call the office     KP 

## 2011-07-21 NOTE — Telephone Encounter (Signed)
Please call regarding BP Ph# [phone]

## 2011-07-22 NOTE — Telephone Encounter (Signed)
Pt is returning your call

## 2011-07-22 NOTE — Telephone Encounter (Signed)
Call placed to placed to patient at [phone], she is concerned that her blood pressure was elevated at endocrinology office. She is requesting the last 2 readings recorded at her office visit. Blood pressure readings provided. No additional concerns at this time.

## 2011-07-24 ENCOUNTER — Telehealth: Payer: Self-pay | Admitting: Family Medicine

## 2011-07-24 DIAGNOSIS — F329 Major depressive disorder, single episode, unspecified: Secondary | ICD-10-CM

## 2011-07-24 MED ORDER — MIRTAZAPINE 7.5 MG PO TABS: 7.5000 mg | ORAL_TABLET | Freq: Every day | ORAL | Status: DC

## 2011-07-24 NOTE — Telephone Encounter (Signed)
Patient called stating she needs [place] to send a 90 day rx for mirtazapine 15mg  to [facility]. She states she is feeling and sleeping much better taking 1 a day. She is going to [place] and would like a 3 month supply of this medication and any other medication that is due for refills.

## 2011-07-24 NOTE — Telephone Encounter (Signed)
Please advise if it ok to send a 90 day supply to [facility]. Thank You    KP

## 2011-07-24 NOTE — Telephone Encounter (Signed)
Ok three-month supply, no RF,  of mirtazepine  and any other non controlled meds

## 2011-07-24 NOTE — Telephone Encounter (Signed)
Refill done.  

## 2011-07-24 NOTE — Telephone Encounter (Signed)
Addended by: Edwena Felty T on: 07/24/2011 11:28 AM   Modules accepted: Orders

## 2011-07-27 ENCOUNTER — Telehealth: Payer: Self-pay | Admitting: Family Medicine

## 2011-07-27 NOTE — Telephone Encounter (Signed)
Mary Flynn (OT) states the patient has been discharged as of 07/23/11. Pt will continue PT. Call back # [phone]

## 2011-07-27 NOTE — Telephone Encounter (Signed)
FYI  KP

## 2011-08-05 ENCOUNTER — Telehealth: Payer: Self-pay | Admitting: Internal Medicine

## 2011-08-05 NOTE — Telephone Encounter (Signed)
lmomtcb to discuss refills needed.

## 2011-08-06 MED ORDER — HYDROCODONE-HOMATROPINE 5-1.5 MG/5ML PO SYRP: ORAL_SOLUTION | ORAL | Status: DC

## 2011-08-06 NOTE — Telephone Encounter (Signed)
RX has been called into [facility] and pt is aware nothing further was needed

## 2011-08-06 NOTE — Telephone Encounter (Signed)
Per CY-okay to give #300ml 

## 2011-08-06 NOTE — Telephone Encounter (Signed)
Please advise directions, thanks

## 2011-08-06 NOTE — Telephone Encounter (Signed)
Pt returned call- states she is coughing "all the time."  Requesting rx for hydromet.  She is requesting a "big bottle."  Dr. Maple Hudson, pls advise.  Thank you.

## 2011-08-06 NOTE — Telephone Encounter (Signed)
1 tsp every hour 6 hours prn cough no refills

## 2011-08-20 ENCOUNTER — Ambulatory Visit: Payer: Medicare Other | Admitting: Internal Medicine

## 2011-08-21 ENCOUNTER — Ambulatory Visit: Payer: Medicare Other | Admitting: Internal Medicine

## 2011-08-24 ENCOUNTER — Encounter: Payer: Self-pay | Admitting: Family Medicine

## 2011-08-24 ENCOUNTER — Ambulatory Visit (INDEPENDENT_AMBULATORY_CARE_PROVIDER_SITE_OTHER): Payer: Medicare Other | Admitting: Family Medicine

## 2011-08-24 VITALS — BP 120/70 | HR 97 | Temp 98.2°F | Wt 142.0 lb

## 2011-08-24 DIAGNOSIS — E119 Type 2 diabetes mellitus without complications: Secondary | ICD-10-CM

## 2011-08-24 DIAGNOSIS — E785 Hyperlipidemia, unspecified: Secondary | ICD-10-CM

## 2011-08-24 DIAGNOSIS — M25539 Pain in unspecified wrist: Secondary | ICD-10-CM

## 2011-08-24 DIAGNOSIS — M810 Age-related osteoporosis without current pathological fracture: Secondary | ICD-10-CM

## 2011-08-24 DIAGNOSIS — F32A Depression, unspecified: Secondary | ICD-10-CM

## 2011-08-24 DIAGNOSIS — M79601 Pain in right arm: Secondary | ICD-10-CM

## 2011-08-24 DIAGNOSIS — I10 Essential (primary) hypertension: Secondary | ICD-10-CM

## 2011-08-24 DIAGNOSIS — F329 Major depressive disorder, single episode, unspecified: Secondary | ICD-10-CM

## 2011-08-24 DIAGNOSIS — M79609 Pain in unspecified limb: Secondary | ICD-10-CM

## 2011-08-24 MED ORDER — ZOLPIDEM TARTRATE 5 MG PO TABS: 5.0000 mg | ORAL_TABLET | Freq: Every evening | ORAL | Status: DC | PRN

## 2011-08-24 MED ORDER — GLIMEPIRIDE 2 MG PO TABS: 2.0000 mg | ORAL_TABLET | Freq: Every day | ORAL | Status: DC

## 2011-08-24 MED ORDER — GABAPENTIN 100 MG PO CAPS: 100.0000 mg | ORAL_CAPSULE | Freq: Three times a day (TID) | ORAL | Status: DC

## 2011-08-24 MED ORDER — NIACIN ER (ANTIHYPERLIPIDEMIC) 1000 MG PO TBCR: 1000.0000 mg | EXTENDED_RELEASE_TABLET | Freq: Every day | ORAL | Status: DC

## 2011-08-24 MED ORDER — PANTOPRAZOLE SODIUM 40 MG PO TBEC: 40.0000 mg | DELAYED_RELEASE_TABLET | Freq: Every day | ORAL | Status: DC

## 2011-08-24 MED ORDER — AMLODIPINE BESYLATE 2.5 MG PO TABS: 2.5000 mg | ORAL_TABLET | Freq: Every day | ORAL | Status: DC

## 2011-08-24 MED ORDER — METFORMIN HCL 1000 MG PO TABS: 1000.0000 mg | ORAL_TABLET | Freq: Two times a day (BID) | ORAL | Status: DC

## 2011-08-24 MED ORDER — ATORVASTATIN CALCIUM 10 MG PO TABS: 10.0000 mg | ORAL_TABLET | Freq: Every day | ORAL | Status: DC

## 2011-08-24 MED ORDER — INSULIN ASPART 100 UNIT/ML ~~LOC~~ SOLN: SUBCUTANEOUS | Status: DC

## 2011-08-24 MED ORDER — MIRTAZAPINE 7.5 MG PO TABS: 7.5000 mg | ORAL_TABLET | Freq: Every day | ORAL | Status: DC

## 2011-08-24 MED ORDER — ALENDRONATE SODIUM 70 MG PO TABS: 70.0000 mg | ORAL_TABLET | ORAL | Status: DC

## 2011-08-24 MED ORDER — LEVOCETIRIZINE DIHYDROCHLORIDE 5 MG PO TABS: 5.0000 mg | ORAL_TABLET | Freq: Every evening | ORAL | Status: DC

## 2011-08-24 MED ORDER — SITAGLIPTIN PHOS-METFORMIN HCL 50-500 MG PO TABS: 1.0000 | ORAL_TABLET | Freq: Two times a day (BID) | ORAL | Status: DC

## 2011-08-24 MED ORDER — GLIMEPIRIDE 2 MG PO TABS: 2.0000 mg | ORAL_TABLET | Freq: Three times a day (TID) | ORAL | Status: DC

## 2011-08-24 MED ORDER — LOSARTAN POTASSIUM 100 MG PO TABS: 100.0000 mg | ORAL_TABLET | Freq: Every day | ORAL | Status: DC

## 2011-08-24 NOTE — Progress Notes (Signed)
  Subjective:    Patient ID: Mary Flynn, female    DOB: 1934/03/26, 77 y.o.   MRN: [id number]  HPI Pt here c/o pain in upper R arm and wrist/hand.  She has been wearing splints on R hand for suspected cts but it did not help and upper arm pain started bothering her recently.  No known injury. Pt also c/o R ankle pain that started yesterday--no injury. Pt also needs her prescriptions printed out--- she will be living in [place] for the next three months.     Review of Systems As above    Objective:   Physical Exam  Constitutional: She is oriented to person, place, and time. She appears well-developed and well-nourished.  Neck: Normal range of motion. Neck supple.  Cardiovascular: Normal rate, regular rhythm and normal heart sounds.   Pulmonary/Chest: Effort normal.  Musculoskeletal: Normal range of motion. She exhibits no edema.        R shoulder ---- FROM but with pain when raising arm and pain ant shoulder with palpation  R hand--- numbness in hand and fingers  Neurological: She is alert and oriented to person, place, and time.  Psychiatric: She has a normal mood and affect. Her behavior is normal.          Assessment & Plan:

## 2011-08-24 NOTE — Assessment & Plan Note (Signed)
Pain in shoulder with palpation and numbness in hand and fingers Refer to ortho for further evaluation

## 2011-08-24 NOTE — Assessment & Plan Note (Signed)
Per endo Prescriptions printed for pt

## 2011-08-24 NOTE — Patient Instructions (Signed)
Wrist Pain Wrist injuries are frequent in adults and children. A sprain is an injury to the ligaments that hold your bones together. A strain is an injury to muscle or muscle cord-like structures (tendons) from stretching or pulling. Generally, when wrists are moderately tender to touch following a fall or injury, a break in the bone (fracture) may be present. Most wrist sprains or strains are better in 3 to 5 days, but complete healing may take several weeks. HOME CARE INSTRUCTIONS   Put ice on the injured area.   Put ice in a plastic bag.   Place a towel between your skin and the bag.   Leave the ice on for 15 to 20 minutes, 3 to 4 times a day, for the first 2 days.   Keep your arm raised above the level of your heart whenever possible to reduce swelling and pain.   Rest the injured area for at least 48 hours or as directed by your caregiver.   If a splint or elastic bandage has been applied, use it for as long as directed by your caregiver or until seen by a caregiver for a follow-up exam.   Only take over-the-counter or prescription medicines for pain, discomfort, or fever as directed by your caregiver.   Keep all follow-up appointments. You may need to follow up with a specialist or have follow-up X-rays. Improvement in pain level is not a guarantee that you did not fracture a bone in your wrist. The only way to determine whether or not you have a broken bone is by X-ray.  SEEK IMMEDIATE MEDICAL CARE IF:   Your fingers are swollen, very red, white, or cold and blue.   Your fingers are numb or tingling.   You have increasing pain.   You have difficulty moving your fingers.  MAKE SURE YOU:   Understand these instructions.   Will watch your condition.   Will get help right away if you are not doing well or get worse.  Document Released: 11/19/2004 Document Revised: 01/29/2011 Document Reviewed: 04/02/2010 [employer] Patient Information 2012 [employer]. 

## 2011-08-24 NOTE — Assessment & Plan Note (Signed)
con't meds 

## 2011-08-24 NOTE — Assessment & Plan Note (Signed)
rx printed Check labs

## 2011-08-25 ENCOUNTER — Telehealth: Payer: Self-pay | Admitting: Family Medicine

## 2011-08-25 ENCOUNTER — Ambulatory Visit: Payer: Medicare Other | Admitting: Internal Medicine

## 2011-08-25 DIAGNOSIS — M810 Age-related osteoporosis without current pathological fracture: Secondary | ICD-10-CM

## 2011-08-25 DIAGNOSIS — I10 Essential (primary) hypertension: Secondary | ICD-10-CM

## 2011-08-25 DIAGNOSIS — F329 Major depressive disorder, single episode, unspecified: Secondary | ICD-10-CM

## 2011-08-25 DIAGNOSIS — E785 Hyperlipidemia, unspecified: Secondary | ICD-10-CM

## 2011-08-25 MED ORDER — PANTOPRAZOLE SODIUM 40 MG PO TBEC: 40.0000 mg | DELAYED_RELEASE_TABLET | Freq: Every day | ORAL | Status: DC

## 2011-08-25 MED ORDER — MIRTAZAPINE 7.5 MG PO TABS: 7.5000 mg | ORAL_TABLET | Freq: Every day | ORAL | Status: DC

## 2011-08-25 MED ORDER — LOSARTAN POTASSIUM 100 MG PO TABS: 100.0000 mg | ORAL_TABLET | Freq: Every day | ORAL | Status: DC

## 2011-08-25 MED ORDER — METFORMIN HCL 1000 MG PO TABS: 1000.0000 mg | ORAL_TABLET | Freq: Two times a day (BID) | ORAL | Status: DC

## 2011-08-25 MED ORDER — ALENDRONATE SODIUM 70 MG PO TABS: 70.0000 mg | ORAL_TABLET | ORAL | Status: DC

## 2011-08-25 MED ORDER — GLIMEPIRIDE 2 MG PO TABS: 2.0000 mg | ORAL_TABLET | Freq: Three times a day (TID) | ORAL | Status: DC

## 2011-08-25 MED ORDER — SITAGLIPTIN PHOS-METFORMIN HCL 50-500 MG PO TABS: 1.0000 | ORAL_TABLET | Freq: Two times a day (BID) | ORAL | Status: DC

## 2011-08-25 MED ORDER — ATORVASTATIN CALCIUM 10 MG PO TABS: 10.0000 mg | ORAL_TABLET | Freq: Every day | ORAL | Status: DC

## 2011-08-25 MED ORDER — AMLODIPINE BESYLATE 2.5 MG PO TABS: 2.5000 mg | ORAL_TABLET | Freq: Every day | ORAL | Status: DC

## 2011-08-25 MED ORDER — NIACIN ER (ANTIHYPERLIPIDEMIC) 1000 MG PO TBCR: 1000.0000 mg | EXTENDED_RELEASE_TABLET | Freq: Every day | ORAL | Status: DC

## 2011-08-25 MED ORDER — GABAPENTIN 100 MG PO CAPS: 100.0000 mg | ORAL_CAPSULE | Freq: Three times a day (TID) | ORAL | Status: DC

## 2011-08-25 NOTE — Telephone Encounter (Signed)
Pt has questions about her medications. Requesting Arman Bogus call her back.

## 2011-08-25 NOTE — Telephone Encounter (Signed)
Patient would like her labs check on the 09/06/11 before her CPE on 09/07/11 future orders are already in the system and would like her prescriptions sent to [facility] because [facility] yesterday and the were too expensive. Rx sent.

## 2011-09-04 ENCOUNTER — Other Ambulatory Visit (INDEPENDENT_AMBULATORY_CARE_PROVIDER_SITE_OTHER): Payer: Medicare Other

## 2011-09-04 DIAGNOSIS — I10 Essential (primary) hypertension: Secondary | ICD-10-CM

## 2011-09-04 DIAGNOSIS — E785 Hyperlipidemia, unspecified: Secondary | ICD-10-CM

## 2011-09-04 DIAGNOSIS — E119 Type 2 diabetes mellitus without complications: Secondary | ICD-10-CM

## 2011-09-04 LAB — BASIC METABOLIC PANEL
BUN: 12 mg/dL (ref 6–23)
CO2: 27 mEq/L (ref 19–32)
Calcium: 9.3 mg/dL (ref 8.4–10.5)
Creatinine, Ser: 0.5 mg/dL (ref 0.4–1.2)
GFR: 127.88 mL/min (ref >60.00)
Glucose, Bld: 111 mg/dL — ABNORMAL HIGH (ref 70–99)
Sodium: 135 mEq/L (ref 135–145)

## 2011-09-04 LAB — HEPATIC FUNCTION PANEL
ALT: 20 U/L (ref 0–35)
AST: 22 U/L (ref 0–37)
Alkaline Phosphatase: 47 U/L (ref 39–117)
Bilirubin, Direct: 0.1 mg/dL (ref 0.0–0.3)
Total Bilirubin: 1.3 mg/dL — ABNORMAL HIGH (ref 0.3–1.2)

## 2011-09-04 LAB — CBC WITH DIFFERENTIAL/PLATELET
Basophils Absolute: 0 10*3/uL (ref 0.0–0.1)
Eosinophils Absolute: 0.1 10*3/uL (ref 0.0–0.7)
Hemoglobin: 12.4 g/dL (ref 12.0–15.0)
Lymphocytes Relative: 21.1 % (ref 12.0–46.0)
Lymphs Abs: 1.8 10*3/uL (ref 0.7–4.0)
MCHC: 32.5 g/dL (ref 30.0–36.0)
Neutro Abs: 5.9 10*3/uL (ref 1.4–7.7)
Platelets: 218 10*3/uL (ref 150.0–400.0)
RDW: 15.1 % — ABNORMAL HIGH (ref 11.5–14.6)

## 2011-09-04 LAB — URINALYSIS
Bilirubin Urine: NEGATIVE
Hgb urine dipstick: NEGATIVE
Ketones, ur: NEGATIVE
Leukocytes, UA: NEGATIVE
pH: 6.5 (ref 5.0–8.0)

## 2011-09-04 LAB — MICROALBUMIN / CREATININE URINE RATIO
Microalb Creat Ratio: 10.2 mg/g (ref 0.0–30.0)
Microalb, Ur: 3.5 mg/dL — ABNORMAL HIGH (ref 0.0–1.9)

## 2011-09-04 LAB — LIPID PANEL: Total CHOL/HDL Ratio: 2

## 2011-09-04 LAB — HEMOGLOBIN A1C: Hgb A1c MFr Bld: 7.7 % — ABNORMAL HIGH (ref 4.6–6.5)

## 2011-09-04 NOTE — Progress Notes (Signed)
Lab only 

## 2011-09-07 ENCOUNTER — Ambulatory Visit (INDEPENDENT_AMBULATORY_CARE_PROVIDER_SITE_OTHER): Payer: Medicare Other | Admitting: Family Medicine

## 2011-09-07 ENCOUNTER — Encounter: Payer: Self-pay | Admitting: Family Medicine

## 2011-09-07 VITALS — BP 124/70 | HR 99 | Temp 98.3°F | Ht 58.5 in | Wt 144.4 lb

## 2011-09-07 DIAGNOSIS — Z0000 Encounter for general adult medical examination without abnormal findings: Secondary | ICD-10-CM

## 2011-09-07 DIAGNOSIS — I10 Essential (primary) hypertension: Secondary | ICD-10-CM

## 2011-09-07 DIAGNOSIS — E1142 Type 2 diabetes mellitus with diabetic polyneuropathy: Secondary | ICD-10-CM

## 2011-09-07 DIAGNOSIS — F3289 Other specified depressive episodes: Secondary | ICD-10-CM

## 2011-09-07 DIAGNOSIS — E1140 Type 2 diabetes mellitus with diabetic neuropathy, unspecified: Secondary | ICD-10-CM

## 2011-09-07 DIAGNOSIS — E785 Hyperlipidemia, unspecified: Secondary | ICD-10-CM

## 2011-09-07 DIAGNOSIS — Z1239 Encounter for other screening for malignant neoplasm of breast: Secondary | ICD-10-CM

## 2011-09-07 DIAGNOSIS — Z780 Asymptomatic menopausal state: Secondary | ICD-10-CM

## 2011-09-07 DIAGNOSIS — F329 Major depressive disorder, single episode, unspecified: Secondary | ICD-10-CM

## 2011-09-07 DIAGNOSIS — E1149 Type 2 diabetes mellitus with other diabetic neurological complication: Secondary | ICD-10-CM

## 2011-09-07 MED ORDER — MIRTAZAPINE 7.5 MG PO TABS: 7.5000 mg | ORAL_TABLET | Freq: Every day | ORAL | Status: DC

## 2011-09-07 NOTE — Assessment & Plan Note (Signed)
On gabepentin

## 2011-09-07 NOTE — Assessment & Plan Note (Signed)
Cont meds Check labs 

## 2011-09-07 NOTE — Progress Notes (Signed)
Subjective:    Mary Flynn is a 77 y.o. female who presents for Medicare Annual/Subsequent preventive examination.  Preventive Screening-Counseling & Management  Tobacco History  Smoking status  . Never Smoker   Smokeless tobacco  . Never Used     Problems Prior to Visit 1.   Current Problems (verified) Patient Active Problem List  Diagnosis  . FIBROIDS, UTERUS  . THYROID NODULE, RIGHT  . DM w/o Complication Type II  . DIABETIC PERIPHERAL NEUROPATHY  . HYPERLIPIDEMIA  . HYPERTENSION  . GERD  . CONSTIPATION  . UTI  . Acute low back pain due to trauma  . OSTEOPOROSIS NOS  . MEMORY LOSS  . Chronic bronchitis NEC  . ABDOMINAL BLOATING  . SPRAIN&STRAIN OTHER SPECIFIED SITES KNEE&LEG  . LACERATION OF FINGER  . URINARY INCONTINENCE, STRESS, FEMALE  . PPD positive  . Chronic insomnia  . Compression fracture of L2 lumbar vertebra  . Depression  . Arm pain, right    Medications Prior to Visit Current Outpatient Prescriptions on File Prior to Visit  Medication Sig Dispense Refill  . alendronate (FOSAMAX) 70 MG tablet Take 1 tablet (70 mg total) by mouth every 7 (seven) days. Take with a full glass of water on an empty stomach.  12 tablet  3  . amLODipine (NORVASC) 2.5 MG tablet Take 1 tablet (2.5 mg total) by mouth daily.  90 tablet  3  . aspirin EC 81 MG tablet Take 81 mg by mouth daily.      Marland Kitchen atorvastatin (LIPITOR) 10 MG tablet Take 1 tablet (10 mg total) by mouth daily.  90 tablet  3  . benzonatate (TESSALON) 100 MG capsule Take 100 mg by mouth 3 (three) times daily as needed.      . calcium-vitamin D (OSCAL WITH D) 500-200 MG-UNIT per tablet Take 1 tablet by mouth 2 (two) times daily.  60 tablet  0  . Cetirizine HCl (ZYRTEC PO) Take 1 tablet by mouth daily as needed. Patient took this medication for her allergies.      Marland Kitchen gabapentin (NEURONTIN) 100 MG capsule Take 1 capsule (100 mg total) by mouth 3 (three) times daily.  270 capsule  3  . glimepiride (AMARYL) 2 MG  tablet Take 1 tablet (2 mg total) by mouth 3 (three) times daily.  270 tablet  3  . guaiFENesin-codeine (ROBITUSSIN AC) 100-10 MG/5ML syrup 1-2 tsp po qhs prn  120 mL  0  . HYDROcodone-homatropine (HYDROMET) 5-1.5 MG/5ML syrup 1 tsp every 6 hours as needed for cough  300 mL  0  . insulin aspart (NOVOLOG) 100 UNIT/ML injection Sliding scale  1 vial  3  . Insulin Detemir (LEVEMIR FLEXPEN [place]) Inject 18 Units into the skin at bedtime.        Marland Kitchen levocetirizine (XYZAL) 5 MG tablet Take 1 tablet (5 mg total) by mouth every evening.  90 tablet  3  . losartan (COZAAR) 100 MG tablet Take 1 tablet (100 mg total) by mouth daily.  90 tablet  3  . metFORMIN (GLUCOPHAGE) 1000 MG tablet Take 1 tablet (1,000 mg total) by mouth 2 (two) times daily with a meal.  180 tablet  3  . mirtazapine (REMERON) 7.5 MG tablet Take 1 tablet (7.5 mg total) by mouth at bedtime.  90 tablet  0  . mometasone (ELOCON) 0.1 % cream Apply topically daily.  45 g  0  . Multiple Vitamin (MULITIVITAMIN WITH MINERALS) TABS Take 1 tablet by mouth daily.      Marland Kitchen  niacin (NIASPAN) 1000 MG CR tablet Take 1 tablet (1,000 mg total) by mouth at bedtime.  90 tablet  3  . pantoprazole (PROTONIX) 40 MG tablet Take 1 tablet (40 mg total) by mouth daily.  90 tablet  3  . senna (SENOKOT) 8.6 MG TABS Take 1 tablet (8.6 mg total) by mouth daily.  120 each  0  . sitaGLIPtan-metformin (JANUMET) 50-500 MG per tablet Take 1 tablet by mouth 2 (two) times daily with a meal.  180 tablet  3  . VITAMIN E PO Take 1 tablet by mouth daily.      Marland Kitchen zolpidem (AMBIEN) 5 MG tablet Take 1 tablet (5 mg total) by mouth at bedtime as needed for sleep. 1 or 2  At bedtime if needed  30 tablet  0  . omeprazole (PRILOSEC) 20 MG capsule Take 1 capsule (20 mg total) by mouth daily.  90 capsule  1  . DISCONTD: losartan (COZAAR) 100 MG tablet Take 1 tablet (100 mg total) by mouth daily.  90 tablet  3    Current Medications (verified) Current Outpatient Prescriptions  Medication Sig  Dispense Refill  . alendronate (FOSAMAX) 70 MG tablet Take 1 tablet (70 mg total) by mouth every 7 (seven) days. Take with a full glass of water on an empty stomach.  12 tablet  3  . amLODipine (NORVASC) 2.5 MG tablet Take 1 tablet (2.5 mg total) by mouth daily.  90 tablet  3  . aspirin EC 81 MG tablet Take 81 mg by mouth daily.      Marland Kitchen atorvastatin (LIPITOR) 10 MG tablet Take 1 tablet (10 mg total) by mouth daily.  90 tablet  3  . benzonatate (TESSALON) 100 MG capsule Take 100 mg by mouth 3 (three) times daily as needed.      . calcium-vitamin D (OSCAL WITH D) 500-200 MG-UNIT per tablet Take 1 tablet by mouth 2 (two) times daily.  60 tablet  0  . Cetirizine HCl (ZYRTEC PO) Take 1 tablet by mouth daily as needed. Patient took this medication for her allergies.      Marland Kitchen gabapentin (NEURONTIN) 100 MG capsule Take 1 capsule (100 mg total) by mouth 3 (three) times daily.  270 capsule  3  . glimepiride (AMARYL) 2 MG tablet Take 1 tablet (2 mg total) by mouth 3 (three) times daily.  270 tablet  3  . guaiFENesin-codeine (ROBITUSSIN AC) 100-10 MG/5ML syrup 1-2 tsp po qhs prn  120 mL  0  . HYDROcodone-homatropine (HYDROMET) 5-1.5 MG/5ML syrup 1 tsp every 6 hours as needed for cough  300 mL  0  . insulin aspart (NOVOLOG) 100 UNIT/ML injection Sliding scale  1 vial  3  . Insulin Detemir (LEVEMIR FLEXPEN [place]) Inject 18 Units into the skin at bedtime.        Marland Kitchen levocetirizine (XYZAL) 5 MG tablet Take 1 tablet (5 mg total) by mouth every evening.  90 tablet  3  . losartan (COZAAR) 100 MG tablet Take 1 tablet (100 mg total) by mouth daily.  90 tablet  3  . metFORMIN (GLUCOPHAGE) 1000 MG tablet Take 1 tablet (1,000 mg total) by mouth 2 (two) times daily with a meal.  180 tablet  3  . mirtazapine (REMERON) 7.5 MG tablet Take 1 tablet (7.5 mg total) by mouth at bedtime.  90 tablet  0  . mometasone (ELOCON) 0.1 % cream Apply topically daily.  45 g  0  . Multiple Vitamin (MULITIVITAMIN WITH MINERALS) TABS Take  1 tablet by  mouth daily.      . niacin (NIASPAN) 1000 MG CR tablet Take 1 tablet (1,000 mg total) by mouth at bedtime.  90 tablet  3  . pantoprazole (PROTONIX) 40 MG tablet Take 1 tablet (40 mg total) by mouth daily.  90 tablet  3  . senna (SENOKOT) 8.6 MG TABS Take 1 tablet (8.6 mg total) by mouth daily.  120 each  0  . sitaGLIPtan-metformin (JANUMET) 50-500 MG per tablet Take 1 tablet by mouth 2 (two) times daily with a meal.  180 tablet  3  . VITAMIN E PO Take 1 tablet by mouth daily.      Marland Kitchen zolpidem (AMBIEN) 5 MG tablet Take 1 tablet (5 mg total) by mouth at bedtime as needed for sleep. 1 or 2  At bedtime if needed  30 tablet  0  . omeprazole (PRILOSEC) 20 MG capsule Take 1 capsule (20 mg total) by mouth daily.  90 capsule  1  . DISCONTD: losartan (COZAAR) 100 MG tablet Take 1 tablet (100 mg total) by mouth daily.  90 tablet  3     Allergies (verified) Penicillins and Sulfonamide derivatives   PAST HISTORY  Family History Family History  Problem Relation Age of Onset  . Depression    . Cancer      laryngeal  . Diabetes      Social History History  Substance Use Topics  . Smoking status: Never Smoker   . Smokeless tobacco: Never Used  . Alcohol Use: No     Are there smokers in your home (other than you)? No  Risk Factors Current exercise habits: The patient does not participate in regular exercise at present.  Dietary issues discussed: na   Cardiac risk factors: advanced age (older than 9 for men, 1 for women), diabetes mellitus, dyslipidemia, hypertension and sedentary lifestyle.  Depression Screen (Note: if answer to either of the following is "Yes", a more complete depression screening is indicated)   Over the past two weeks, have you felt down, depressed or hopeless? No  Over the past two weeks, have you felt little interest or pleasure in doing things? No  Have you lost interest or pleasure in daily life? No  Do you often feel hopeless? No  Do you cry easily over simple  problems? No  Activities of Daily Living In your present state of health, do you have any difficulty performing the following activities?:  Driving? Yes Managing money?  No Feeding yourself? No Getting from bed to chair? No Climbing a flight of stairs? No Preparing food and eating?: No Bathing or showering? No Getting dressed: No Getting to the toilet? No Using the toilet:No Moving around from place to place: No In the past year have you fallen or had a near fall?:Yes   Are you sexually active?  No  Do you have more than one partner?  No  Hearing Difficulties: No Do you often ask people to speak up or repeat themselves? No Do you experience ringing or noises in your ears? No Do you have difficulty understanding soft or whispered voices? No   Do you feel that you have a problem with memory? No  Do you often misplace items? No  Do you feel safe at home?  Yes  Cognitive Testing  Alert? Yes  Normal Appearance?Yes  Oriented to person? Yes  Place? Yes   Time? Yes  Recall of three objects?  Yes  Can perform simple calculations? Yes  Displays appropriate judgment?Yes  Can read the correct time from a watch face?Yes   Advanced Directives have been discussed with the patient? Yes  List the Names of Other Physician/Practitioners you currently use: 1.  Endo--kumar 2   opht-- [place] 3.  Dentist-- [place] Indicate any recent Medical Services you may have received from other than [facility] providers in the past year (date may be approximate).  Immunization History  Administered Date(s) Administered  . Influenza Split 11/24/2010  . Influenza Whole 11/30/2007, 12/05/2008, 12/04/2009  . PPD Test 02/16/2011, 04/14/2011  . Pneumococcal Polysaccharide 12/09/2007  . Td 10/12/2006  . Zoster 06/04/2010    Screening Tests Health Maintenance  Topic Date Due  . Colonoscopy  10/13/1986  . Influenza Vaccine  11/24/2011  . Tetanus/tdap  10/11/2016  . Pneumococcal Polysaccharide Vaccine Age 55 And  Over  Completed  . Zostavax  Completed    All answers were reviewed with the patient and necessary referrals were made:  Loreen Freud, DO   09/07/2011   History reviewed: allergies, current medications, past family history, past medical history, past social history, past surgical history and problem list  Review of Systems  Review of Systems  Constitutional: Negative for activity change, appetite change and fatigue.  HENT: Negative for hearing loss, congestion, tinnitus and ear discharge.   Eyes: Negative for visual disturbance (see optho q1y -- vision corrected to 20/20 with glasses).  Respiratory: Negative for cough, chest tightness and shortness of breath.   Cardiovascular: Negative for chest pain, palpitations and leg swelling.  Gastrointestinal: Negative for abdominal pain, diarrhea, constipation and abdominal distention.  Genitourinary: Negative for urgency, frequency, decreased urine volume and difficulty urinating.  Musculoskeletal: Negative for back pain, arthralgias and gait problem.  Skin: Negative for color change, pallor and rash.  Neurological: Negative for dizziness, light-headedness, numbness and headaches.  Hematological: Negative for adenopathy. Does not bruise/bleed easily.  Psychiatric/Behavioral: Negative for suicidal ideas, confusion, sleep disturbance, self-injury, dysphoric mood, decreased concentration and agitation.  Pt is able to read and write and can do all ADLs No risk for falling No abuse/ violence in home       Objective:     Vision by Snellen--- optho  Body mass index is 29.67 kg/(m^2). BP 124/70  Pulse 99  Temp 98.3 F (36.8 C) (Oral)  Ht 4' 10.5" (1.486 m)  Wt 144 lb 6.4 oz (65.499 kg)  BMI 29.67 kg/m2  SpO2 99%  BP 124/70  Pulse 99  Temp 98.3 F (36.8 C) (Oral)  Ht 4' 10.5" (1.486 m)  Wt 144 lb 6.4 oz (65.499 kg)  BMI 29.67 kg/m2  SpO2 99% General appearance: alert, cooperative, appears stated age and no distress Head:  Normocephalic, without obvious abnormality, atraumatic Eyes: perla, eomi Ears: normal TM's and external ear canals both ears Nose: Nares normal. Septum midline. Mucosa normal. No drainage or sinus tenderness. Throat: lips, mucosa, and tongue normal; teeth and gums normal Neck: no adenopathy, no carotid bruit, no JVD, supple, symmetrical, trachea midline and thyroid not enlarged, symmetric, no tenderness/mass/nodules Back: symmetric, no curvature. ROM normal. No CVA tenderness. Lungs: clear to auscultation bilaterally Breasts: normal appearance, no masses or tenderness Heart: S1, S2 normal Abdomen: soft, non-tender; bowel sounds normal; no masses,  no organomegaly Pelvic: deferred Extremities: extremities normal, atraumatic, no cyanosis or edema Pulses: 2+ and symmetric Skin: Skin color, texture, turgor normal. No rashes or lesions Lymph nodes: Cervical, supraclavicular, and axillary nodes normal. Neurologic: Grossly normal Psych-- no depression, no anxiety     Assessment:  cpe     Plan:    see AVS During the course of the visit the patient was educated and counseled about appropriate screening and preventive services including:    Pneumococcal vaccine   Hepatitis B vaccine  Td vaccine  Screening mammography  Screening Pap smear and pelvic exam   Bone densitometry screening  Colorectal cancer screening  Advanced directives: has an advanced directive - a copy HAS NOT been provided.  Diet review for nutrition referral? Yes ____  Not Indicated __x__   Patient Instructions (the written plan) was given to the patient.  Medicare Attestation I have personally reviewed: The patient's medical and social history Their use of alcohol, tobacco or illicit drugs Their current medications and supplements The patient's functional ability including ADLs,fall risks, home safety risks, cognitive, and hearing and visual impairment Diet and physical activities Evidence for  depression or mood disorders  The patient's weight, height, BMI, and visual acuity have been recorded in the chart.  I have made referrals, counseling, and provided education to the patient based on review of the above and I have provided the patient with a written personalized care plan for preventive services.     Loreen Freud, DO   09/07/2011

## 2011-09-07 NOTE — Patient Instructions (Signed)
Preventive Care for Adults, Female A healthy lifestyle and preventive care can promote health and wellness. Preventive health guidelines for women include the following key practices.  A routine yearly physical is a good way to check with your caregiver about your health and preventive screening. It is a chance to share any concerns and updates on your health, and to receive a thorough exam.   Visit your dentist for a routine exam and preventive care every 6 months. Brush your teeth twice a day and floss once a day. Good oral hygiene prevents tooth decay and gum disease.   The frequency of eye exams is based on your age, health, family medical history, use of contact lenses, and other factors. Follow your caregiver's recommendations for frequency of eye exams.   Eat a healthy diet. Foods like vegetables, fruits, whole grains, low-fat dairy products, and lean protein foods contain the nutrients you need without too many calories. Decrease your intake of foods high in solid fats, added sugars, and salt. Eat the right amount of calories for you.Get information about a proper diet from your caregiver, if necessary.   Regular physical exercise is one of the most important things you can do for your health. Most adults should get at least 150 minutes of moderate-intensity exercise (any activity that increases your heart rate and causes you to sweat) each week. In addition, most adults need muscle-strengthening exercises on 2 or more days a week.   Maintain a healthy weight. The body mass index (BMI) is a screening tool to identify possible weight problems. It provides an estimate of body fat based on height and weight. Your caregiver can help determine your BMI, and can help you achieve or maintain a healthy weight.For adults 20 years and older:   A BMI below 18.5 is considered underweight.   A BMI of 18.5 to 24.9 is normal.   A BMI of 25 to 29.9 is considered overweight.   A BMI of 30 and above is  considered obese.   Maintain normal blood lipids and cholesterol levels by exercising and minimizing your intake of saturated fat. Eat a balanced diet with plenty of fruit and vegetables. Blood tests for lipids and cholesterol should begin at age 20 and be repeated every 5 years. If your lipid or cholesterol levels are high, you are over 50, or you are at high risk for heart disease, you may need your cholesterol levels checked more frequently.Ongoing high lipid and cholesterol levels should be treated with medicines if diet and exercise are not effective.   If you smoke, find out from your caregiver how to quit. If you do not use tobacco, do not start.   If you are pregnant, do not drink alcohol. If you are breastfeeding, be very cautious about drinking alcohol. If you are not pregnant and choose to drink alcohol, do not exceed 1 drink per day. One drink is considered to be 12 ounces (355 mL) of beer, 5 ounces (148 mL) of wine, or 1.5 ounces (44 mL) of liquor.   Avoid use of street drugs. Do not share needles with anyone. Ask for help if you need support or instructions about stopping the use of drugs.   High blood pressure causes heart disease and increases the risk of stroke. Your blood pressure should be checked at least every 1 to 2 years. Ongoing high blood pressure should be treated with medicines if weight loss and exercise are not effective.   If you are 55 to 75   years old, ask your caregiver if you should take aspirin to prevent strokes.   Diabetes screening involves taking a blood sample to check your fasting blood sugar level. This should be done once every 3 years, after age 45, if you are within normal weight and without risk factors for diabetes. Testing should be considered at a younger age or be carried out more frequently if you are overweight and have at least 1 risk factor for diabetes.   Breast cancer screening is essential preventive care for women. You should practice "breast  self-awareness." This means understanding the normal appearance and feel of your breasts and may include breast self-examination. Any changes detected, no matter how small, should be reported to a caregiver. Women in their 20s and 30s should have a clinical breast exam (CBE) by a caregiver as part of a regular health exam every 1 to 3 years. After age 40, women should have a CBE every year. Starting at age 40, women should consider having a mammography (breast X-ray test) every year. Women who have a family history of breast cancer should talk to their caregiver about genetic screening. Women at a high risk of breast cancer should talk to their caregivers about having magnetic resonance imaging (MRI) and a mammography every year.   The Pap test is a screening test for cervical cancer. A Pap test can show cell changes on the cervix that might become cervical cancer if left untreated. A Pap test is a procedure in which cells are obtained and examined from the lower end of the uterus (cervix).   Women should have a Pap test starting at age 21.   Between ages 21 and 29, Pap tests should be repeated every 2 years.   Beginning at age 30, you should have a Pap test every 3 years as long as the past 3 Pap tests have been normal.   Some women have medical problems that increase the chance of getting cervical cancer. Talk to your caregiver about these problems. It is especially important to talk to your caregiver if a new problem develops soon after your last Pap test. In these cases, your caregiver may recommend more frequent screening and Pap tests.   The above recommendations are the same for women who have or have not gotten the vaccine for human papillomavirus (HPV).   If you had a hysterectomy for a problem that was not cancer or a condition that could lead to cancer, then you no longer need Pap tests. Even if you no longer need a Pap test, a regular exam is a good idea to make sure no other problems are  starting.   If you are between ages 65 and 70, and you have had normal Pap tests going back 10 years, you no longer need Pap tests. Even if you no longer need a Pap test, a regular exam is a good idea to make sure no other problems are starting.   If you have had past treatment for cervical cancer or a condition that could lead to cancer, you need Pap tests and screening for cancer for at least 20 years after your treatment.   If Pap tests have been discontinued, risk factors (such as a new sexual partner) need to be reassessed to determine if screening should be resumed.   The HPV test is an additional test that may be used for cervical cancer screening. The HPV test looks for the virus that can cause the cell changes on the cervix.   The cells collected during the Pap test can be tested for HPV. The HPV test could be used to screen women aged 30 years and older, and should be used in women of any age who have unclear Pap test results. After the age of 30, women should have HPV testing at the same frequency as a Pap test.   Colorectal cancer can be detected and often prevented. Most routine colorectal cancer screening begins at the age of 50 and continues through age 75. However, your caregiver may recommend screening at an earlier age if you have risk factors for colon cancer. On a yearly basis, your caregiver may provide home test kits to check for hidden blood in the stool. Use of a small camera at the end of a tube, to directly examine the colon (sigmoidoscopy or colonoscopy), can detect the earliest forms of colorectal cancer. Talk to your caregiver about this at age 50, when routine screening begins. Direct examination of the colon should be repeated every 5 to 10 years through age 75, unless early forms of pre-cancerous polyps or small growths are found.   Hepatitis C blood testing is recommended for all people born from 1945 through 1965 and any individual with known risks for hepatitis C.    Practice safe sex. Use condoms and avoid high-risk sexual practices to reduce the spread of sexually transmitted infections (STIs). STIs include gonorrhea, chlamydia, syphilis, trichomonas, herpes, HPV, and human immunodeficiency virus (HIV). Herpes, HIV, and HPV are viral illnesses that have no cure. They can result in disability, cancer, and death. Sexually active women aged 25 and younger should be checked for chlamydia. Older women with new or multiple partners should also be tested for chlamydia. Testing for other STIs is recommended if you are sexually active and at increased risk.   Osteoporosis is a disease in which the bones lose minerals and strength with aging. This can result in serious bone fractures. The risk of osteoporosis can be identified using a bone density scan. Women ages 65 and over and women at risk for fractures or osteoporosis should discuss screening with their caregivers. Ask your caregiver whether you should take a calcium supplement or vitamin D to reduce the rate of osteoporosis.   Menopause can be associated with physical symptoms and risks. Hormone replacement therapy is available to decrease symptoms and risks. You should talk to your caregiver about whether hormone replacement therapy is right for you.   Use sunscreen with sun protection factor (SPF) of 30 or more. Apply sunscreen liberally and repeatedly throughout the day. You should seek shade when your shadow is shorter than you. Protect yourself by wearing long sleeves, pants, a wide-brimmed hat, and sunglasses year round, whenever you are outdoors.   Once a month, do a whole body skin exam, using a mirror to look at the skin on your back. Notify your caregiver of new moles, moles that have irregular borders, moles that are larger than a pencil eraser, or moles that have changed in shape or color.   Stay current with required immunizations.   Influenza. You need a dose every fall (or winter). The composition of  the flu vaccine changes each year, so being vaccinated once is not enough.   Pneumococcal polysaccharide. You need 1 to 2 doses if you smoke cigarettes or if you have certain chronic medical conditions. You need 1 dose at age 65 (or older) if you have never been vaccinated.   Tetanus, diphtheria, pertussis (Tdap, Td). Get 1 dose of   Tdap vaccine if you are younger than age 65, are over 65 and have contact with an infant, are a healthcare worker, are pregnant, or simply want to be protected from whooping cough. After that, you need a Td booster dose every 10 years. Consult your caregiver if you have not had at least 3 tetanus and diphtheria-containing shots sometime in your life or have a deep or dirty wound.   HPV. You need this vaccine if you are a woman age 26 or younger. The vaccine is given in 3 doses over 6 months.   Measles, mumps, rubella (MMR). You need at least 1 dose of MMR if you were born in 1957 or later. You may also need a second dose.   Meningococcal. If you are age 19 to 21 and a first-year college student living in a residence hall, or have one of several medical conditions, you need to get vaccinated against meningococcal disease. You may also need additional booster doses.   Zoster (shingles). If you are age 60 or older, you should get this vaccine.   Varicella (chickenpox). If you have never had chickenpox or you were vaccinated but received only 1 dose, talk to your caregiver to find out if you need this vaccine.   Hepatitis A. You need this vaccine if you have a specific risk factor for hepatitis A virus infection or you simply wish to be protected from this disease. The vaccine is usually given as 2 doses, 6 to 18 months apart.   Hepatitis B. You need this vaccine if you have a specific risk factor for hepatitis B virus infection or you simply wish to be protected from this disease. The vaccine is given in 3 doses, usually over 6 months.  Preventive Services /  Frequency Ages 19 to 39  Blood pressure check.** / Every 1 to 2 years.   Lipid and cholesterol check.** / Every 5 years beginning at age 20.   Clinical breast exam.** / Every 3 years for women in their 20s and 30s.   Pap test.** / Every 2 years from ages 21 through 29. Every 3 years starting at age 30 through age 65 or 70 with a history of 3 consecutive normal Pap tests.   HPV screening.** / Every 3 years from ages 30 through ages 65 to 70 with a history of 3 consecutive normal Pap tests.   Hepatitis C blood test.** / For any individual with known risks for hepatitis C.   Skin self-exam. / Monthly.   Influenza immunization.** / Every year.   Pneumococcal polysaccharide immunization.** / 1 to 2 doses if you smoke cigarettes or if you have certain chronic medical conditions.   Tetanus, diphtheria, pertussis (Tdap, Td) immunization. / A one-time dose of Tdap vaccine. After that, you need a Td booster dose every 10 years.   HPV immunization. / 3 doses over 6 months, if you are 26 and younger.   Measles, mumps, rubella (MMR) immunization. / You need at least 1 dose of MMR if you were born in 1957 or later. You may also need a second dose.   Meningococcal immunization. / 1 dose if you are age 19 to 21 and a first-year college student living in a residence hall, or have one of several medical conditions, you need to get vaccinated against meningococcal disease. You may also need additional booster doses.   Varicella immunization.** / Consult your caregiver.   Hepatitis A immunization.** / Consult your caregiver. 2 doses, 6 to 18 months   apart.   Hepatitis B immunization.** / Consult your caregiver. 3 doses usually over 6 months.  Ages 40 to 64  Blood pressure check.** / Every 1 to 2 years.   Lipid and cholesterol check.** / Every 5 years beginning at age 20.   Clinical breast exam.** / Every year after age 40.   Mammogram.** / Every year beginning at age 40 and continuing for as  long as you are in good health. Consult with your caregiver.   Pap test.** / Every 3 years starting at age 30 through age 65 or 70 with a history of 3 consecutive normal Pap tests.   HPV screening.** / Every 3 years from ages 30 through ages 65 to 70 with a history of 3 consecutive normal Pap tests.   Fecal occult blood test (FOBT) of stool. / Every year beginning at age 50 and continuing until age 75. You may not need to do this test if you get a colonoscopy every 10 years.   Flexible sigmoidoscopy or colonoscopy.** / Every 5 years for a flexible sigmoidoscopy or every 10 years for a colonoscopy beginning at age 50 and continuing until age 75.   Hepatitis C blood test.** / For all people born from 1945 through 1965 and any individual with known risks for hepatitis C.   Skin self-exam. / Monthly.   Influenza immunization.** / Every year.   Pneumococcal polysaccharide immunization.** / 1 to 2 doses if you smoke cigarettes or if you have certain chronic medical conditions.   Tetanus, diphtheria, pertussis (Tdap, Td) immunization.** / A one-time dose of Tdap vaccine. After that, you need a Td booster dose every 10 years.   Measles, mumps, rubella (MMR) immunization. / You need at least 1 dose of MMR if you were born in 1957 or later. You may also need a second dose.   Varicella immunization.** / Consult your caregiver.   Meningococcal immunization.** / Consult your caregiver.   Hepatitis A immunization.** / Consult your caregiver. 2 doses, 6 to 18 months apart.   Hepatitis B immunization.** / Consult your caregiver. 3 doses, usually over 6 months.  Ages 65 and over  Blood pressure check.** / Every 1 to 2 years.   Lipid and cholesterol check.** / Every 5 years beginning at age 20.   Clinical breast exam.** / Every year after age 40.   Mammogram.** / Every year beginning at age 40 and continuing for as long as you are in good health. Consult with your caregiver.   Pap test.** /  Every 3 years starting at age 30 through age 65 or 70 with a 3 consecutive normal Pap tests. Testing can be stopped between 65 and 70 with 3 consecutive normal Pap tests and no abnormal Pap or HPV tests in the past 10 years.   HPV screening.** / Every 3 years from ages 30 through ages 65 or 70 with a history of 3 consecutive normal Pap tests. Testing can be stopped between 65 and 70 with 3 consecutive normal Pap tests and no abnormal Pap or HPV tests in the past 10 years.   Fecal occult blood test (FOBT) of stool. / Every year beginning at age 50 and continuing until age 75. You may not need to do this test if you get a colonoscopy every 10 years.   Flexible sigmoidoscopy or colonoscopy.** / Every 5 years for a flexible sigmoidoscopy or every 10 years for a colonoscopy beginning at age 50 and continuing until age 75.   Hepatitis   C blood test.** / For all people born from 1945 through 1965 and any individual with known risks for hepatitis C.   Osteoporosis screening.** / A one-time screening for women ages 65 and over and women at risk for fractures or osteoporosis.   Skin self-exam. / Monthly.   Influenza immunization.** / Every year.   Pneumococcal polysaccharide immunization.** / 1 dose at age 65 (or older) if you have never been vaccinated.   Tetanus, diphtheria, pertussis (Tdap, Td) immunization. / A one-time dose of Tdap vaccine if you are over 65 and have contact with an infant, are a healthcare worker, or simply want to be protected from whooping cough. After that, you need a Td booster dose every 10 years.   Varicella immunization.** / Consult your caregiver.   Meningococcal immunization.** / Consult your caregiver.   Hepatitis A immunization.** / Consult your caregiver. 2 doses, 6 to 18 months apart.   Hepatitis B immunization.** / Check with your caregiver. 3 doses, usually over 6 months.  ** Family history and personal history of risk and conditions may change your caregiver's  recommendations. Document Released: 04/07/2001 Document Revised: 01/29/2011 Document Reviewed: 07/07/2010 [employer] Patient Information 2012 [employer]. 

## 2011-09-07 NOTE — Assessment & Plan Note (Signed)
Per Dr Lucianne Muss

## 2011-09-07 NOTE — Assessment & Plan Note (Signed)
Stable Check labs 

## 2011-09-15 ENCOUNTER — Telehealth: Payer: Self-pay | Admitting: Family Medicine

## 2011-09-15 NOTE — Telephone Encounter (Signed)
Pt. called and is requesting samples of Novalog, please call when ready ph# [phone]

## 2011-09-15 NOTE — Telephone Encounter (Signed)
Patient aware Novolog is ready for pick up.     KP

## 2011-09-15 NOTE — Telephone Encounter (Signed)
If we have it she can have one 

## 2011-09-17 ENCOUNTER — Encounter: Payer: Self-pay | Admitting: Family Medicine

## 2011-09-17 ENCOUNTER — Ambulatory Visit (INDEPENDENT_AMBULATORY_CARE_PROVIDER_SITE_OTHER): Payer: Medicare Other | Admitting: Family Medicine

## 2011-09-17 ENCOUNTER — Telehealth: Payer: Self-pay | Admitting: Family Medicine

## 2011-09-17 VITALS — BP 132/74 | HR 96 | Temp 98.4°F | Wt 144.0 lb

## 2011-09-17 DIAGNOSIS — E1149 Type 2 diabetes mellitus with other diabetic neurological complication: Secondary | ICD-10-CM

## 2011-09-17 DIAGNOSIS — E1140 Type 2 diabetes mellitus with diabetic neuropathy, unspecified: Secondary | ICD-10-CM

## 2011-09-17 DIAGNOSIS — S32009A Unspecified fracture of unspecified lumbar vertebra, initial encounter for closed fracture: Secondary | ICD-10-CM

## 2011-09-17 DIAGNOSIS — S32020A Wedge compression fracture of second lumbar vertebra, initial encounter for closed fracture: Secondary | ICD-10-CM

## 2011-09-17 DIAGNOSIS — E1142 Type 2 diabetes mellitus with diabetic polyneuropathy: Secondary | ICD-10-CM

## 2011-09-17 NOTE — Progress Notes (Signed)
  Subjective:    Patient ID: Mary Flynn, female    DOB: 07-Sep-1934, 77 y.o.   MRN: [id number]  HPI The above patient is here only to get a letter written for lawyer ---- there is a lot of problems with her sons estate and the daughter in laws lawyer wants her to go to [place].  Pt is unable to travel at this time secondary to uncontrolled DM , vision and orthopedic problems.      Review of Systems    as above Objective:   Physical Exam  Constitutional: She appears well-developed and well-nourished.  Psychiatric: She has a normal mood and affect. Her behavior is normal. Judgment and thought content normal.          Assessment & Plan:  Diabetes, back pain, vision problems----letter written for lawyer

## 2011-09-17 NOTE — Assessment & Plan Note (Signed)
Per ortho.  

## 2011-09-17 NOTE — Telephone Encounter (Signed)
Instead of "back recently" in first paragraph she needs form to read, and have the dates for how long in hospital and dates for therapy. They would like it to read exactly what they had on the draft -for the first paragraph.  They do like how the rest of the letter though and would like it to stay. She would like you to call her so she can come pick it up. I think that's it....amy

## 2011-09-17 NOTE — Telephone Encounter (Signed)
wants to go over blood sugar again please call [phone]

## 2011-09-17 NOTE — Assessment & Plan Note (Signed)
Uncontrolled at this time Under care of endo

## 2011-09-17 NOTE — Telephone Encounter (Signed)
Instead of "back recently" in first paragraph she needs form to read, and have the dates for how long in hospital and dates for therapy. They would like it to read exactly what they had on the draft -for the first paragraph.  They do like how the rest of the letter though and would like it to stay. She would like you to call her so she can come pick it up. I think that's it....amy  °

## 2011-09-18 NOTE — Telephone Encounter (Signed)
New letter complete and left at check in. Patient aware    KP

## 2011-09-18 NOTE — Telephone Encounter (Signed)
I will need copy of letter again because or dates and times -----

## 2011-09-23 ENCOUNTER — Encounter: Payer: Medicare Other | Admitting: Family Medicine

## 2011-10-08 DIAGNOSIS — Z0279 Encounter for issue of other medical certificate: Secondary | ICD-10-CM

## 2011-12-07 ENCOUNTER — Other Ambulatory Visit: Payer: Self-pay

## 2011-12-07 DIAGNOSIS — M810 Age-related osteoporosis without current pathological fracture: Secondary | ICD-10-CM

## 2011-12-07 MED ORDER — FLUOXETINE HCL 10 MG PO CAPS
10.0000 mg | ORAL_CAPSULE | Freq: Every day | ORAL | Status: DC
Start: 2011-12-07 — End: 2011-12-10

## 2011-12-07 MED ORDER — PANTOPRAZOLE SODIUM 40 MG PO TBEC: 40.0000 mg | DELAYED_RELEASE_TABLET | Freq: Every day | ORAL | Status: DC

## 2011-12-07 NOTE — Telephone Encounter (Signed)
Last OV 09/17/11. Last filled Protonix 08/25/11 #90 X3, fluoxetine 06/18/11 but can't see the amount.  Plz advise

## 2011-12-10 MED ORDER — PANTOPRAZOLE SODIUM 40 MG PO TBEC: 40.0000 mg | DELAYED_RELEASE_TABLET | Freq: Every day | ORAL | Status: DC

## 2011-12-10 MED ORDER — FLUOXETINE HCL 10 MG PO CAPS: 10.0000 mg | ORAL_CAPSULE | Freq: Every day | ORAL | Status: DC

## 2011-12-10 NOTE — Telephone Encounter (Signed)
wants status of refills-advised pt Lowne out of office til 10.25 would need to be approved by another physician as soon as that was done we would send

## 2011-12-10 NOTE — Telephone Encounter (Signed)
Rx not received by the pharmacy---- Re-faxed the Rx        KP

## 2012-03-07 ENCOUNTER — Encounter: Payer: Self-pay | Admitting: Lab

## 2012-03-08 ENCOUNTER — Encounter: Payer: Self-pay | Admitting: Family Medicine

## 2012-03-08 ENCOUNTER — Ambulatory Visit (INDEPENDENT_AMBULATORY_CARE_PROVIDER_SITE_OTHER): Payer: Medicare Other | Admitting: Family Medicine

## 2012-03-08 ENCOUNTER — Telehealth: Payer: Self-pay | Admitting: Family Medicine

## 2012-03-08 VITALS — BP 134/76 | HR 99 | Temp 98.3°F | Wt 147.2 lb

## 2012-03-08 DIAGNOSIS — M810 Age-related osteoporosis without current pathological fracture: Secondary | ICD-10-CM | POA: Diagnosis not present

## 2012-03-08 DIAGNOSIS — E1140 Type 2 diabetes mellitus with diabetic neuropathy, unspecified: Secondary | ICD-10-CM

## 2012-03-08 DIAGNOSIS — R51 Headache: Secondary | ICD-10-CM

## 2012-03-08 DIAGNOSIS — Z0000 Encounter for general adult medical examination without abnormal findings: Secondary | ICD-10-CM

## 2012-03-08 DIAGNOSIS — R519 Headache, unspecified: Secondary | ICD-10-CM | POA: Insufficient documentation

## 2012-03-08 DIAGNOSIS — L259 Unspecified contact dermatitis, unspecified cause: Secondary | ICD-10-CM

## 2012-03-08 DIAGNOSIS — L309 Dermatitis, unspecified: Secondary | ICD-10-CM

## 2012-03-08 DIAGNOSIS — E785 Hyperlipidemia, unspecified: Secondary | ICD-10-CM

## 2012-03-08 DIAGNOSIS — I10 Essential (primary) hypertension: Secondary | ICD-10-CM

## 2012-03-08 DIAGNOSIS — E1142 Type 2 diabetes mellitus with diabetic polyneuropathy: Secondary | ICD-10-CM

## 2012-03-08 DIAGNOSIS — E1149 Type 2 diabetes mellitus with other diabetic neurological complication: Secondary | ICD-10-CM

## 2012-03-08 MED ORDER — ATORVASTATIN CALCIUM 10 MG PO TABS: 10.0000 mg | ORAL_TABLET | Freq: Every day | ORAL | Status: DC

## 2012-03-08 MED ORDER — ALENDRONATE SODIUM 70 MG PO TABS: 70.0000 mg | ORAL_TABLET | ORAL | Status: DC

## 2012-03-08 MED ORDER — CYCLOBENZAPRINE HCL 10 MG PO TABS: 10.0000 mg | ORAL_TABLET | Freq: Three times a day (TID) | ORAL | Status: DC | PRN

## 2012-03-08 MED ORDER — GLIMEPIRIDE 2 MG PO TABS: 2.0000 mg | ORAL_TABLET | Freq: Three times a day (TID) | ORAL | Status: DC

## 2012-03-08 MED ORDER — AMLODIPINE BESYLATE 2.5 MG PO TABS: 2.5000 mg | ORAL_TABLET | Freq: Every day | ORAL | Status: DC

## 2012-03-08 MED ORDER — NIACIN ER (ANTIHYPERLIPIDEMIC) 1000 MG PO TBCR: 1000.0000 mg | EXTENDED_RELEASE_TABLET | Freq: Every day | ORAL | Status: DC

## 2012-03-08 MED ORDER — LOSARTAN POTASSIUM 100 MG PO TABS: 100.0000 mg | ORAL_TABLET | Freq: Every day | ORAL | Status: DC

## 2012-03-08 MED ORDER — GABAPENTIN 100 MG PO CAPS: 100.0000 mg | ORAL_CAPSULE | Freq: Three times a day (TID) | ORAL | Status: DC

## 2012-03-08 MED ORDER — METFORMIN HCL 1000 MG PO TABS: 1000.0000 mg | ORAL_TABLET | Freq: Two times a day (BID) | ORAL | Status: DC

## 2012-03-08 MED ORDER — PANTOPRAZOLE SODIUM 40 MG PO TBEC: 40.0000 mg | DELAYED_RELEASE_TABLET | Freq: Every day | ORAL | Status: DC

## 2012-03-08 NOTE — Telephone Encounter (Signed)
pt called stated she has called Dr.Kumar's ofc & they will not allow her to add the LFT & Cholesterol to her labs on friday --please call in an order cb# [phone]

## 2012-03-08 NOTE — Patient Instructions (Signed)

## 2012-03-08 NOTE — Assessment & Plan Note (Signed)
Per ENDo

## 2012-03-08 NOTE — Progress Notes (Signed)
  Subjective:    Patient ID: Mary Flynn, female    DOB: 05-22-1934, 78 y.o.   MRN: [id number]  HPI Pt here to f/u --- she came back from [place].  She sees the foot doctor later today and will see Dr Lucianne Muss for all labs later this week. Pt c/o rash on arms that is very itchy.  Creams in past helped some but it comes right back.  Pt c/o inc headaches and pain in arms but she has an appointment with Dr Danielle Dess tomorrow.   Pt is requesting a derm referral.  Review of Systems    as above Objective:   Physical Exam BP 134/76  Pulse 99  Temp 98.3 F (36.8 C) (Oral)  Wt 147 lb 3.2 oz (66.769 kg)  SpO2 97% General appearance: alert, cooperative, appears stated age and no distress Lungs: clear to auscultation bilaterally Heart: S1, S2 normal Extremities: extremities normal, atraumatic, no cyanosis or edema Sensory exam of the foot is normal, tested with the monofilament. Good pulses, no lesions or ulcers, good peripheral pulses.       Assessment & Plan:

## 2012-03-08 NOTE — Assessment & Plan Note (Signed)
Stable con't meds 

## 2012-03-08 NOTE — Assessment & Plan Note (Signed)
Check labs with Dr Lucianne Muss  con't meds

## 2012-03-08 NOTE — Assessment & Plan Note (Signed)
Refer to derm Cont with cream prn for now

## 2012-03-08 NOTE — Telephone Encounter (Signed)
Please advise if it is ok to fax and order-----  KP

## 2012-03-08 NOTE — Telephone Encounter (Signed)
Please add lipid and hep 272.4

## 2012-03-08 NOTE — Assessment & Plan Note (Signed)
Flexeril rx Pt has appointment with NS tomorrow

## 2012-03-09 DIAGNOSIS — M47812 Spondylosis without myelopathy or radiculopathy, cervical region: Secondary | ICD-10-CM | POA: Diagnosis not present

## 2012-03-09 DIAGNOSIS — M542 Cervicalgia: Secondary | ICD-10-CM | POA: Diagnosis not present

## 2012-03-09 DIAGNOSIS — M5412 Radiculopathy, cervical region: Secondary | ICD-10-CM | POA: Diagnosis not present

## 2012-03-10 NOTE — Telephone Encounter (Signed)
Faxed to Dr.Kumar [phone]

## 2012-03-11 ENCOUNTER — Other Ambulatory Visit: Payer: Self-pay | Admitting: Neurological Surgery

## 2012-03-11 DIAGNOSIS — E785 Hyperlipidemia, unspecified: Secondary | ICD-10-CM | POA: Diagnosis not present

## 2012-03-11 DIAGNOSIS — M542 Cervicalgia: Secondary | ICD-10-CM

## 2012-03-15 DIAGNOSIS — I10 Essential (primary) hypertension: Secondary | ICD-10-CM | POA: Diagnosis not present

## 2012-03-15 DIAGNOSIS — E785 Hyperlipidemia, unspecified: Secondary | ICD-10-CM | POA: Diagnosis not present

## 2012-03-16 ENCOUNTER — Telehealth: Payer: Self-pay | Admitting: *Deleted

## 2012-03-16 DIAGNOSIS — L259 Unspecified contact dermatitis, unspecified cause: Secondary | ICD-10-CM | POA: Diagnosis not present

## 2012-03-16 NOTE — Telephone Encounter (Signed)
Prior Mary Flynn form has been initiated.

## 2012-03-17 ENCOUNTER — Ambulatory Visit
Admission: RE | Admit: 2012-03-17 | Discharge: 2012-03-17 | Disposition: A | Discharge status: Home / Self Care | Payer: Medicare Other | Source: Ambulatory Visit | Attending: Neurological Surgery | Admitting: Neurological Surgery

## 2012-03-17 ENCOUNTER — Other Ambulatory Visit: Payer: Self-pay | Admitting: Neurological Surgery

## 2012-03-17 DIAGNOSIS — M542 Cervicalgia: Secondary | ICD-10-CM

## 2012-03-17 DIAGNOSIS — M5030 Other cervical disc degeneration, unspecified cervical region: Secondary | ICD-10-CM | POA: Diagnosis not present

## 2012-03-17 DIAGNOSIS — M79609 Pain in unspecified limb: Secondary | ICD-10-CM | POA: Diagnosis not present

## 2012-03-17 NOTE — Telephone Encounter (Signed)
Just one a day

## 2012-03-17 NOTE — Telephone Encounter (Signed)
Please advise on the Niacin is there a slow release?

## 2012-03-17 NOTE — Telephone Encounter (Signed)
Pt states she needs to speak with Mary Flynn regarding niacin. She needs rx for slow niacin 1000mg . She needs 2 copies and will come to the office to pick up paper copies.

## 2012-03-17 NOTE — Telephone Encounter (Signed)
Please advise on dose and directions or will it be the same.     KP

## 2012-03-17 NOTE — Telephone Encounter (Signed)
There is--- its otc

## 2012-03-18 ENCOUNTER — Other Ambulatory Visit: Payer: Self-pay | Admitting: Family Medicine

## 2012-03-18 DIAGNOSIS — E785 Hyperlipidemia, unspecified: Secondary | ICD-10-CM

## 2012-03-18 MED ORDER — NIACIN ER 500 MG PO TBCR: 1000.0000 mg | EXTENDED_RELEASE_TABLET | Freq: Two times a day (BID) | ORAL | Status: DC

## 2012-03-18 MED ORDER — NIACIN ER 1000 MG PO TBCR: EXTENDED_RELEASE_TABLET | ORAL | Status: DC

## 2012-03-18 NOTE — Telephone Encounter (Signed)
Patient wants to time released niacin 1000 mg     KP

## 2012-03-18 NOTE — Telephone Encounter (Signed)
Patient aware Rx ready for pick up.      KP 

## 2012-03-18 NOTE — Telephone Encounter (Signed)
Patient said she will need an Rx for the 1000 mg of Niacin          KP

## 2012-03-18 NOTE — Telephone Encounter (Signed)
done

## 2012-03-18 NOTE — Telephone Encounter (Signed)
Printed out

## 2012-03-22 DIAGNOSIS — M47812 Spondylosis without myelopathy or radiculopathy, cervical region: Secondary | ICD-10-CM | POA: Diagnosis not present

## 2012-03-24 ENCOUNTER — Telehealth: Payer: Self-pay | Admitting: Family Medicine

## 2012-03-24 NOTE — Telephone Encounter (Signed)
Patient states her insurance needs to know pt needs to take glimepiride 3x daily. Patient would like someone to call her back. She uses [facility].

## 2012-03-28 ENCOUNTER — Ambulatory Visit: Payer: Medicare Other | Attending: Neurological Surgery | Admitting: Physical Therapy

## 2012-03-28 DIAGNOSIS — IMO0001 Reserved for inherently not codable concepts without codable children: Secondary | ICD-10-CM | POA: Diagnosis not present

## 2012-03-28 DIAGNOSIS — M542 Cervicalgia: Secondary | ICD-10-CM | POA: Insufficient documentation

## 2012-03-28 DIAGNOSIS — M2569 Stiffness of other specified joint, not elsewhere classified: Secondary | ICD-10-CM | POA: Insufficient documentation

## 2012-03-28 DIAGNOSIS — M25519 Pain in unspecified shoulder: Secondary | ICD-10-CM | POA: Diagnosis not present

## 2012-03-28 NOTE — Telephone Encounter (Signed)
Pt states that we need to call about glimepride and flexeril. [phone] [id number]

## 2012-03-31 ENCOUNTER — Ambulatory Visit: Payer: Medicare Other | Admitting: Physical Therapy

## 2012-04-05 ENCOUNTER — Ambulatory Visit: Payer: Medicare Other | Admitting: Physical Therapy

## 2012-04-06 ENCOUNTER — Telehealth: Payer: Self-pay | Admitting: Family Medicine

## 2012-04-06 DIAGNOSIS — R51 Headache: Secondary | ICD-10-CM

## 2012-04-06 NOTE — Telephone Encounter (Signed)
Prior Auth approved flexeril 10 mg 04-05-12 until 04-05-13, approval letter scan to chart

## 2012-04-06 NOTE — Telephone Encounter (Signed)
Patient has questions about gabapentin and cyclobenzaprine. She would like 3 month supply of both. Fax to [phone]

## 2012-04-07 ENCOUNTER — Ambulatory Visit: Payer: Medicare Other | Admitting: Physical Therapy

## 2012-04-11 NOTE — Telephone Encounter (Signed)
patient would like the medications for the gabapentin and flexeril to send to mail order. She said she took the last Rx to the local pharmacy since she was still here but the medications were too expensive, and they were unable to give her the hard copies back, she does not have an account setup with the mail order pharmacy so she wants to pick it up along with a sample of Levemir. Please advise    KP

## 2012-04-11 NOTE — Telephone Encounter (Signed)
Ok to give 3 months each--3 refills on gabepentin

## 2012-04-11 NOTE — Telephone Encounter (Signed)
Patient requesting a call back today regarding message from 2/12. Pt aware we have been closed since Wednesday.

## 2012-04-12 ENCOUNTER — Ambulatory Visit: Payer: Medicare Other | Admitting: Physical Therapy

## 2012-04-12 MED ORDER — GABAPENTIN 100 MG PO CAPS: 100.0000 mg | ORAL_CAPSULE | Freq: Three times a day (TID) | ORAL | Status: DC

## 2012-04-12 MED ORDER — CYCLOBENZAPRINE HCL 10 MG PO TABS: 10.0000 mg | ORAL_TABLET | Freq: Three times a day (TID) | ORAL | Status: DC | PRN

## 2012-04-12 NOTE — Telephone Encounter (Signed)
patient will pick up on Wednesday.      KP

## 2012-04-13 ENCOUNTER — Telehealth: Payer: Self-pay | Admitting: *Deleted

## 2012-04-13 MED ORDER — GLIMEPIRIDE 2 MG PO TABS: 2.0000 mg | ORAL_TABLET | Freq: Two times a day (BID) | ORAL | Status: DC

## 2012-04-13 NOTE — Telephone Encounter (Signed)
RX for glimepiride faxed  Q#[phone] for a 3 mo supply to [facility]     KP

## 2012-04-13 NOTE — Telephone Encounter (Signed)
Patient came to office to give [place] new pharmacy info for mail order. Patient also requests refills on protonix, gabapentin and flexeril. Needs 90 day supply Patient now uses [facility] [phone] fax, must include pt name DOB and ID # [id number]. Refills previously addressed by another clinician

## 2012-04-13 NOTE — Telephone Encounter (Signed)
Prior Auth denied the amount that has been requested is over the limit allowed by your plan. This request is for an amount of 120 tablets. This prescription may only be filled for an amount of 62 tablets for 31 day supply.

## 2012-04-13 NOTE — Telephone Encounter (Signed)
Ok to do [employer]

## 2012-04-14 ENCOUNTER — Ambulatory Visit: Payer: Medicare Other | Admitting: Physical Therapy

## 2012-04-19 ENCOUNTER — Ambulatory Visit: Payer: Medicare Other | Admitting: Physical Therapy

## 2012-04-19 ENCOUNTER — Telehealth: Payer: Self-pay | Admitting: Family Medicine

## 2012-04-19 MED ORDER — PANTOPRAZOLE SODIUM 40 MG PO TBEC: 40.0000 mg | DELAYED_RELEASE_TABLET | Freq: Every day | ORAL | Status: DC

## 2012-04-19 NOTE — Telephone Encounter (Signed)
pt called stated mail order has not recevied any of the prescriptions she requested on 2.19.14 - pt would like a call back # [phone] SEE NOTE dated 2.19.14 as patient was a walk in

## 2012-04-19 NOTE — Telephone Encounter (Signed)
Medications that were left at check have been faxed per patient request because she was unable to make it in to the office     KP

## 2012-04-21 ENCOUNTER — Ambulatory Visit: Payer: Medicare Other | Admitting: Physical Therapy

## 2012-04-26 ENCOUNTER — Ambulatory Visit: Payer: Medicare Other | Attending: Neurological Surgery | Admitting: Physical Therapy

## 2012-04-26 DIAGNOSIS — M2569 Stiffness of other specified joint, not elsewhere classified: Secondary | ICD-10-CM | POA: Insufficient documentation

## 2012-04-26 DIAGNOSIS — IMO0001 Reserved for inherently not codable concepts without codable children: Secondary | ICD-10-CM | POA: Insufficient documentation

## 2012-04-26 DIAGNOSIS — M25519 Pain in unspecified shoulder: Secondary | ICD-10-CM | POA: Insufficient documentation

## 2012-04-26 DIAGNOSIS — M542 Cervicalgia: Secondary | ICD-10-CM | POA: Diagnosis not present

## 2012-04-28 ENCOUNTER — Ambulatory Visit: Payer: Medicare Other | Admitting: Physical Therapy

## 2012-05-02 ENCOUNTER — Telehealth: Payer: Self-pay | Admitting: Internal Medicine

## 2012-05-02 NOTE — Telephone Encounter (Signed)
Patient Information:  Caller Name: Karisha  Phone: [phone]  Patient: Mary Flynn, Mary Flynn  Gender: Female  DOB: [birth year]  Age: 77 Years  PCP: Illene Regulus (Adults only)  Office Follow Up:  Does the office need to follow up with this patient?: Yes  Instructions For The Office: PLease call pt reguarding 90 day supply medication.  Pt states that Pharm does not have rx.  RN Note:  Please call pt with information about 90 day supply medication.  Pt given infomation per Epic but states that Mail Order Pharm does not have the rx for meds.  Symptoms  Reason For Call & Symptoms: Pt is calling about medications.  He states that he received 1 month supply and it cost too much and need 3 month supply of medications.  Pt speaks broken English, however, the information that I could obtain was that his Mail Order [facility] is still telling him that they only have Authorization for one month supply.  Pt request a phone call back from Office RN to explain the need to have medicaiton changed.  Reviewed Health History In EMR: Yes  Reviewed Medications In EMR: Yes  Reviewed Allergies In EMR: Yes  Reviewed Surgeries / Procedures: Yes  Date of Onset of Symptoms: 05/02/2012  Guideline(s) Used:  No Protocol Available - Information Only  Disposition Per Guideline:   Home Care  Reason For Disposition Reached:   Information only question and nurse able to answer  Advice Given:  Call Back If:  You become worse.

## 2012-05-03 ENCOUNTER — Ambulatory Visit: Payer: Medicare Other | Admitting: Physical Therapy

## 2012-05-03 MED ORDER — CYCLOBENZAPRINE HCL 10 MG PO TABS: 10.0000 mg | ORAL_TABLET | Freq: Three times a day (TID) | ORAL | Status: DC | PRN

## 2012-05-05 ENCOUNTER — Ambulatory Visit: Payer: Medicare Other | Admitting: Physical Therapy

## 2012-05-06 ENCOUNTER — Telehealth: Payer: Self-pay

## 2012-05-06 NOTE — Telephone Encounter (Signed)
Rx for cyclobenzaprine has been faxed to [facility] [phone]

## 2012-05-10 ENCOUNTER — Ambulatory Visit: Payer: Medicare Other | Admitting: Physical Therapy

## 2012-05-12 ENCOUNTER — Ambulatory Visit: Payer: Medicare Other | Admitting: Physical Therapy

## 2012-05-12 DIAGNOSIS — H348392 Tributary (branch) retinal vein occlusion, unspecified eye, stable: Secondary | ICD-10-CM | POA: Diagnosis not present

## 2012-05-12 DIAGNOSIS — H04129 Dry eye syndrome of unspecified lacrimal gland: Secondary | ICD-10-CM | POA: Diagnosis not present

## 2012-05-16 DIAGNOSIS — Z1231 Encounter for screening mammogram for malignant neoplasm of breast: Secondary | ICD-10-CM | POA: Diagnosis not present

## 2012-05-17 ENCOUNTER — Ambulatory Visit: Payer: Medicare Other | Admitting: Physical Therapy

## 2012-05-19 ENCOUNTER — Ambulatory Visit: Payer: Medicare Other | Admitting: Physical Therapy

## 2012-05-19 DIAGNOSIS — I10 Essential (primary) hypertension: Secondary | ICD-10-CM | POA: Diagnosis not present

## 2012-05-24 ENCOUNTER — Ambulatory Visit: Payer: Medicare Other | Attending: Neurological Surgery | Admitting: Physical Therapy

## 2012-05-24 DIAGNOSIS — M542 Cervicalgia: Secondary | ICD-10-CM | POA: Insufficient documentation

## 2012-05-24 DIAGNOSIS — IMO0001 Reserved for inherently not codable concepts without codable children: Secondary | ICD-10-CM | POA: Diagnosis not present

## 2012-05-24 DIAGNOSIS — M25519 Pain in unspecified shoulder: Secondary | ICD-10-CM | POA: Diagnosis not present

## 2012-05-24 DIAGNOSIS — M2569 Stiffness of other specified joint, not elsewhere classified: Secondary | ICD-10-CM | POA: Diagnosis not present

## 2012-05-26 ENCOUNTER — Ambulatory Visit: Payer: Medicare Other | Admitting: Physical Therapy

## 2012-05-30 ENCOUNTER — Other Ambulatory Visit: Payer: Self-pay

## 2012-05-30 DIAGNOSIS — R51 Headache: Secondary | ICD-10-CM

## 2012-05-30 MED ORDER — CYCLOBENZAPRINE HCL 10 MG PO TABS: 10.0000 mg | ORAL_TABLET | Freq: Three times a day (TID) | ORAL | Status: DC | PRN

## 2012-05-30 NOTE — Addendum Note (Signed)
Addended by: Arnette Norris on: 05/30/2012 05:16 PM   Modules accepted: Orders

## 2012-05-30 NOTE — Telephone Encounter (Signed)
Call from the patient and she stated that she had and Rx of Flexeril sent to [facility] on 05/03/12 and they did not receive it. She is requesting a 90 day supply. That patient takes the Rx TID. Please advise    KP

## 2012-05-30 NOTE — Telephone Encounter (Signed)
Ok to refill x 1  

## 2012-05-31 ENCOUNTER — Ambulatory Visit: Payer: Medicare Other | Admitting: Physical Therapy

## 2012-06-02 ENCOUNTER — Ambulatory Visit: Payer: Medicare Other | Admitting: Physical Therapy

## 2012-06-06 ENCOUNTER — Ambulatory Visit: Payer: Medicare Other | Admitting: Physical Therapy

## 2012-06-16 ENCOUNTER — Ambulatory Visit: Payer: Medicare Other | Admitting: Physical Therapy

## 2012-06-21 ENCOUNTER — Ambulatory Visit: Payer: Medicare Other | Admitting: Physical Therapy

## 2012-06-23 ENCOUNTER — Ambulatory Visit: Payer: Medicare Other | Attending: Neurological Surgery | Admitting: Physical Therapy

## 2012-06-23 DIAGNOSIS — M2569 Stiffness of other specified joint, not elsewhere classified: Secondary | ICD-10-CM | POA: Insufficient documentation

## 2012-06-23 DIAGNOSIS — M25519 Pain in unspecified shoulder: Secondary | ICD-10-CM | POA: Insufficient documentation

## 2012-06-23 DIAGNOSIS — M542 Cervicalgia: Secondary | ICD-10-CM | POA: Insufficient documentation

## 2012-06-23 DIAGNOSIS — IMO0001 Reserved for inherently not codable concepts without codable children: Secondary | ICD-10-CM | POA: Insufficient documentation

## 2012-06-27 ENCOUNTER — Ambulatory Visit: Payer: Medicare Other | Admitting: Physical Therapy

## 2012-06-30 ENCOUNTER — Ambulatory Visit: Payer: Medicare Other | Admitting: Physical Therapy

## 2012-07-04 ENCOUNTER — Ambulatory Visit: Payer: Medicare Other | Admitting: Physical Therapy

## 2012-07-07 ENCOUNTER — Ambulatory Visit: Payer: Medicare Other | Admitting: Physical Therapy

## 2012-07-11 ENCOUNTER — Ambulatory Visit: Payer: Medicare Other | Admitting: Physical Therapy

## 2012-07-13 ENCOUNTER — Encounter: Payer: Self-pay | Admitting: Family Medicine

## 2012-07-14 ENCOUNTER — Ambulatory Visit: Payer: Medicare Other | Admitting: Physical Therapy

## 2012-07-19 ENCOUNTER — Ambulatory Visit: Payer: Medicare Other | Admitting: Physical Therapy

## 2012-07-21 ENCOUNTER — Encounter: Payer: Medicare Other | Admitting: Physical Therapy

## 2012-07-21 ENCOUNTER — Ambulatory Visit: Payer: Medicare Other | Admitting: Physical Therapy

## 2012-07-26 ENCOUNTER — Ambulatory Visit: Payer: Medicare Other | Attending: Neurological Surgery | Admitting: Physical Therapy

## 2012-07-26 ENCOUNTER — Ambulatory Visit (INDEPENDENT_AMBULATORY_CARE_PROVIDER_SITE_OTHER): Payer: Medicare Other | Admitting: Family Medicine

## 2012-07-26 ENCOUNTER — Encounter: Payer: Self-pay | Admitting: Family Medicine

## 2012-07-26 VITALS — BP 132/80 | HR 99 | Temp 98.7°F | Wt 135.6 lb

## 2012-07-26 DIAGNOSIS — I10 Essential (primary) hypertension: Secondary | ICD-10-CM

## 2012-07-26 DIAGNOSIS — J069 Acute upper respiratory infection, unspecified: Secondary | ICD-10-CM

## 2012-07-26 DIAGNOSIS — B356 Tinea cruris: Secondary | ICD-10-CM

## 2012-07-26 DIAGNOSIS — M25519 Pain in unspecified shoulder: Secondary | ICD-10-CM | POA: Insufficient documentation

## 2012-07-26 DIAGNOSIS — M2569 Stiffness of other specified joint, not elsewhere classified: Secondary | ICD-10-CM | POA: Insufficient documentation

## 2012-07-26 DIAGNOSIS — E1140 Type 2 diabetes mellitus with diabetic neuropathy, unspecified: Secondary | ICD-10-CM

## 2012-07-26 DIAGNOSIS — E1165 Type 2 diabetes mellitus with hyperglycemia: Secondary | ICD-10-CM | POA: Diagnosis not present

## 2012-07-26 DIAGNOSIS — K5900 Constipation, unspecified: Secondary | ICD-10-CM

## 2012-07-26 DIAGNOSIS — K5901 Slow transit constipation: Secondary | ICD-10-CM

## 2012-07-26 DIAGNOSIS — E118 Type 2 diabetes mellitus with unspecified complications: Secondary | ICD-10-CM

## 2012-07-26 DIAGNOSIS — IMO0001 Reserved for inherently not codable concepts without codable children: Secondary | ICD-10-CM | POA: Diagnosis not present

## 2012-07-26 DIAGNOSIS — R350 Frequency of micturition: Secondary | ICD-10-CM | POA: Diagnosis not present

## 2012-07-26 DIAGNOSIS — M542 Cervicalgia: Secondary | ICD-10-CM | POA: Diagnosis not present

## 2012-07-26 DIAGNOSIS — E785 Hyperlipidemia, unspecified: Secondary | ICD-10-CM

## 2012-07-26 DIAGNOSIS — E1142 Type 2 diabetes mellitus with diabetic polyneuropathy: Secondary | ICD-10-CM

## 2012-07-26 DIAGNOSIS — E1149 Type 2 diabetes mellitus with other diabetic neurological complication: Secondary | ICD-10-CM

## 2012-07-26 LAB — POCT URINALYSIS DIPSTICK
Glucose, UA: NEGATIVE
Nitrite, UA: NEGATIVE
Urobilinogen, UA: 0.2

## 2012-07-26 MED ORDER — GLIMEPIRIDE 4 MG PO TABS: ORAL_TABLET | ORAL | Status: DC

## 2012-07-26 MED ORDER — GLIMEPIRIDE 4 MG PO TABS: 4.0000 mg | ORAL_TABLET | Freq: Every day | ORAL | Status: DC

## 2012-07-26 MED ORDER — NYSTATIN 100000 UNIT/GM EX POWD: Freq: Four times a day (QID) | CUTANEOUS | Status: DC

## 2012-07-26 MED ORDER — POLYETHYLENE GLYCOL 3350 17 GM/SCOOP PO POWD: 17.0000 g | Freq: Two times a day (BID) | ORAL | Status: DC

## 2012-07-26 NOTE — Patient Instructions (Addendum)
We will wait for urine culture results to treat.  Get an otc antihistamine like claritin or zyrtec for allergies.  Get Biotin to help with dry mouth.  Schedule a 30 min visit for memory loss.

## 2012-07-26 NOTE — Progress Notes (Signed)
  Subjective:    Patient ID: Mary Flynn, female    DOB: 08-20-1934, 78 y.o.   MRN: [id number]  HPI Pt presents today with multiple problems.  She complains of odor in urine x several days.  No dysuria, no fever, no abdominal.  No fequency.   Pt also c/o neck pain and dizziness with nasal congestion and ears feel full.  + sneezing and sore throat  Pt also c/o rash and itching in groin x several days.   Pt using otc powder which is not helping.   Pt was unable to go to Endo because she needs to get back to [place]----Endo wanted her to increase her glimepride to 4 mg bid and she will f/u with him when she gets back.    Review of Systems    as above Objective:   Physical Exam  BP 132/80  Pulse 99  Temp(Src) 98.7 F (37.1 C) (Oral)  Wt 135 lb 9.6 oz (61.508 kg)  BMI 27.85 kg/m2  SpO2 95% General appearance: alert, cooperative, appears stated age and no distress Nose: clear discharge, mild congestion, turbinates red, swollen, no sinus tenderness Throat: lips, mucosa, and tongue normal; teeth and gums normal Neck: no adenopathy, supple, symmetrical, trachea midline and thyroid not enlarged, symmetric, no tenderness/mass/nodules Lungs: clear to auscultation bilaterally Heart: S1, S2 normal Extremities: extremities normal, atraumatic, no cyanosis or edema Skin: hyperpigmentation - groin Lymph nodes: Cervical, supraclavicular, and axillary nodes normal.      Assessment & Plan:

## 2012-07-27 ENCOUNTER — Telehealth: Payer: Self-pay | Admitting: Family Medicine

## 2012-07-27 DIAGNOSIS — J069 Acute upper respiratory infection, unspecified: Secondary | ICD-10-CM | POA: Insufficient documentation

## 2012-07-27 DIAGNOSIS — B356 Tinea cruris: Secondary | ICD-10-CM | POA: Insufficient documentation

## 2012-07-27 DIAGNOSIS — K5901 Slow transit constipation: Secondary | ICD-10-CM

## 2012-07-27 DIAGNOSIS — R350 Frequency of micturition: Secondary | ICD-10-CM | POA: Insufficient documentation

## 2012-07-27 MED ORDER — POLYETHYLENE GLYCOL 3350 17 GM/SCOOP PO POWD: 17.0000 g | Freq: Two times a day (BID) | ORAL | Status: DC

## 2012-07-27 NOTE — Assessment & Plan Note (Signed)
Check labs con't meds 

## 2012-07-27 NOTE — Telephone Encounter (Signed)
Rx for Miralax was on sent for 30 days and she would like a 3 mo supply. Rx resent     KP

## 2012-07-27 NOTE — Assessment & Plan Note (Signed)
Stable con't meds 

## 2012-07-27 NOTE — Assessment & Plan Note (Signed)
nasonex daily otc antihistamine

## 2012-07-27 NOTE — Assessment & Plan Note (Signed)
Check culture   

## 2012-07-27 NOTE — Telephone Encounter (Signed)
Please contact [facility] per patient. They are holding the Rx so that it can be changed to a 90 day supply which is much cheaper. Please call ASAP.

## 2012-07-27 NOTE — Assessment & Plan Note (Signed)
Nystatin powder.

## 2012-07-27 NOTE — Assessment & Plan Note (Signed)
See meds and orders Will f/u with endo when she returns from [place]

## 2012-07-27 NOTE — Assessment & Plan Note (Addendum)
Fiber miralax F/u GI prn

## 2012-07-28 ENCOUNTER — Ambulatory Visit: Payer: Medicare Other | Admitting: Physical Therapy

## 2012-07-29 ENCOUNTER — Other Ambulatory Visit: Payer: Self-pay | Admitting: General Practice

## 2012-07-29 LAB — URINE CULTURE: Colony Count: >=100000

## 2012-07-29 MED ORDER — CIPROFLOXACIN HCL 100 MG PO TABS: 100.0000 mg | ORAL_TABLET | Freq: Two times a day (BID) | ORAL | Status: DC

## 2012-08-10 ENCOUNTER — Telehealth: Payer: Self-pay | Admitting: Family Medicine

## 2012-08-10 NOTE — Telephone Encounter (Signed)
Wanted to know the name of the OTC medication for dry mouth. I told the patient to get Biotene for her Dry mouth.    KP

## 2012-08-10 NOTE — Telephone Encounter (Signed)
Patient is calling asking to speak with Selena Batten in regards to some questions she has about her medications. Patient declined to let me know what her questions were or which medications she was calling about.

## 2012-08-15 ENCOUNTER — Telehealth: Payer: Self-pay | Admitting: Family Medicine

## 2012-08-15 NOTE — Telephone Encounter (Signed)
I made the husband aware that samples are not available at this time.    KP

## 2012-08-15 NOTE — Telephone Encounter (Signed)
Patient called requesting sample of levemir. She states she needs this before she leaves for [place] at the end of the week.

## 2012-09-01 ENCOUNTER — Other Ambulatory Visit: Payer: Self-pay

## 2012-09-08 ENCOUNTER — Encounter: Payer: Medicare Other | Admitting: Family Medicine

## 2012-11-11 ENCOUNTER — Other Ambulatory Visit: Payer: Self-pay | Admitting: Family Medicine

## 2012-11-18 ENCOUNTER — Ambulatory Visit (INDEPENDENT_AMBULATORY_CARE_PROVIDER_SITE_OTHER): Payer: Medicare Other | Admitting: Family Medicine

## 2012-11-18 ENCOUNTER — Encounter: Payer: Self-pay | Admitting: Family Medicine

## 2012-11-18 VITALS — BP 142/80 | HR 102 | Temp 98.6°F | Ht 58.5 in | Wt 136.0 lb

## 2012-11-18 DIAGNOSIS — Z0000 Encounter for general adult medical examination without abnormal findings: Secondary | ICD-10-CM | POA: Diagnosis not present

## 2012-11-18 DIAGNOSIS — E1142 Type 2 diabetes mellitus with diabetic polyneuropathy: Secondary | ICD-10-CM

## 2012-11-18 DIAGNOSIS — I10 Essential (primary) hypertension: Secondary | ICD-10-CM

## 2012-11-18 DIAGNOSIS — IMO0002 Reserved for concepts with insufficient information to code with codable children: Secondary | ICD-10-CM

## 2012-11-18 DIAGNOSIS — E785 Hyperlipidemia, unspecified: Secondary | ICD-10-CM

## 2012-11-18 DIAGNOSIS — Z23 Encounter for immunization: Secondary | ICD-10-CM

## 2012-11-18 DIAGNOSIS — E1149 Type 2 diabetes mellitus with other diabetic neurological complication: Secondary | ICD-10-CM | POA: Diagnosis not present

## 2012-11-18 DIAGNOSIS — E1140 Type 2 diabetes mellitus with diabetic neuropathy, unspecified: Secondary | ICD-10-CM

## 2012-11-18 DIAGNOSIS — E1165 Type 2 diabetes mellitus with hyperglycemia: Secondary | ICD-10-CM

## 2012-11-18 MED ORDER — GABAPENTIN 100 MG PO CAPS: ORAL_CAPSULE | ORAL | Status: DC

## 2012-11-18 NOTE — Assessment & Plan Note (Signed)
con't meds F/u endo Check labs

## 2012-11-18 NOTE — Progress Notes (Signed)
Subjective:    Mary Flynn is a 78 y.o. female who presents for Medicare Annual/Subsequent preventive examination.  Preventive Screening-Counseling & Management  Tobacco History  Smoking status  . Never Smoker   Smokeless tobacco  . Never Used     Problems Prior to Visit 1.   Current Problems (verified) Patient Active Problem List   Diagnosis Date Noted  . Tinea cruris 07/27/2012  . Acute upper respiratory infections of unspecified site 07/27/2012  . Urinary frequency 07/27/2012  . Eczema 03/08/2012  . Headache 03/08/2012  . Arm pain, right 08/24/2011  . Depression 07/16/2011  . Compression fracture of L2 lumbar vertebra 06/08/2011  . Chronic insomnia 04/26/2011  . PPD positive 04/17/2011  . URINARY INCONTINENCE, STRESS, FEMALE 04/29/2010  . MEMORY LOSS 12/23/2009  . Chronic bronchitis NEC 12/23/2009  . DIABETIC PERIPHERAL NEUROPATHY 06/28/2009  . Acute low back pain due to trauma 12/20/2008  . FIBROIDS, UTERUS 12/18/2008  . ABDOMINAL BLOATING 12/17/2008  . CONSTIPATION 01/24/2008  . GERD 01/23/2008  . SPRAIN&STRAIN OTHER SPECIFIED SITES KNEE&LEG 07/20/2007  . UTI 05/16/2007  . LACERATION OF FINGER 10/12/2006  . THYROID NODULE, RIGHT 08/05/2006  . Diabetes mellitus with neuropathy 08/05/2006  . HYPERLIPIDEMIA 08/05/2006  . HYPERTENSION 08/05/2006  . OSTEOPOROSIS NOS 08/05/2006    Medications Prior to Visit Current Outpatient Prescriptions on File Prior to Visit  Medication Sig Dispense Refill  . alendronate (FOSAMAX) 70 MG tablet Take 1 tablet (70 mg total) by mouth every 7 (seven) days. Take with a full glass of water on an empty stomach.  12 tablet  3  . amLODipine (NORVASC) 2.5 MG tablet TAKE 1 TABLET BY MOUTH EVERY DAY  90 tablet  0  . aspirin EC 81 MG tablet Take 81 mg by mouth daily.      Marland Kitchen atorvastatin (LIPITOR) 10 MG tablet TAKE 1 TABLET BY MOUTH EVERY DAY  90 tablet  0  . calcium-vitamin D (OSCAL WITH D) 500-200 MG-UNIT per tablet Take 1 tablet by  mouth 2 (two) times daily.  60 tablet  0  . Cetirizine HCl (ZYRTEC PO) Take 1 tablet by mouth daily as needed. Patient took this medication for her allergies.      . cyclobenzaprine (FLEXERIL) 10 MG tablet Take 1 tablet (10 mg total) by mouth 3 (three) times daily as needed for muscle spasms.  270 tablet  0  . gabapentin (NEURONTIN) 100 MG capsule Take 1 capsule (100 mg total) by mouth 3 (three) times daily.  270 capsule  3  . glimepiride (AMARYL) 4 MG tablet 1 po bid  180 tablet  3  . insulin aspart (NOVOLOG) 100 UNIT/ML injection Sliding scale  1 vial  3  . Insulin Detemir (LEVEMIR FLEXPEN [place]) Inject 18 Units into the skin at bedtime.        Marland Kitchen losartan (COZAAR) 100 MG tablet Take 1 tablet (100 mg total) by mouth daily.  90 tablet  3  . metFORMIN (GLUCOPHAGE) 1000 MG tablet TAKE 1 TABLET BY MOUTH TWICE DAILY WITH MEALS  180 tablet  0  . mirtazapine (REMERON) 7.5 MG tablet Take 1 tablet (7.5 mg total) by mouth at bedtime.  90 tablet  3  . Multiple Vitamin (MULITIVITAMIN WITH MINERALS) TABS Take 1 tablet by mouth daily.      . Niacin CR 1000 MG TBCR 1 po bid  60 each  5  . nystatin (MYCOSTATIN) powder Apply topically 4 (four) times daily.  15 g  0  . pantoprazole (PROTONIX) 40  MG tablet Take 1 tablet (40 mg total) by mouth daily.  90 tablet  3  . polyethylene glycol powder (GLYCOLAX/MIRALAX) powder Take 17 g by mouth 2 (two) times daily. Take bid prn  10050 g  1  . VITAMIN E PO Take 1 tablet by mouth daily.      Marland Kitchen zolpidem (AMBIEN) 5 MG tablet Take 1 tablet (5 mg total) by mouth at bedtime as needed for sleep. 1 or 2  At bedtime if needed  30 tablet  0   No current facility-administered medications on file prior to visit.    Current Medications (verified) Current Outpatient Prescriptions  Medication Sig Dispense Refill  . alendronate (FOSAMAX) 70 MG tablet Take 1 tablet (70 mg total) by mouth every 7 (seven) days. Take with a full glass of water on an empty stomach.  12 tablet  3  .  amLODipine (NORVASC) 2.5 MG tablet TAKE 1 TABLET BY MOUTH EVERY DAY  90 tablet  0  . aspirin EC 81 MG tablet Take 81 mg by mouth daily.      Marland Kitchen atorvastatin (LIPITOR) 10 MG tablet TAKE 1 TABLET BY MOUTH EVERY DAY  90 tablet  0  . calcium-vitamin D (OSCAL WITH D) 500-200 MG-UNIT per tablet Take 1 tablet by mouth 2 (two) times daily.  60 tablet  0  . Cetirizine HCl (ZYRTEC PO) Take 1 tablet by mouth daily as needed. Patient took this medication for her allergies.      . cyclobenzaprine (FLEXERIL) 10 MG tablet Take 1 tablet (10 mg total) by mouth 3 (three) times daily as needed for muscle spasms.  270 tablet  0  . gabapentin (NEURONTIN) 100 MG capsule Take 1 capsule (100 mg total) by mouth 3 (three) times daily.  270 capsule  3  . glimepiride (AMARYL) 4 MG tablet 1 po bid  180 tablet  3  . insulin aspart (NOVOLOG) 100 UNIT/ML injection Sliding scale  1 vial  3  . Insulin Detemir (LEVEMIR FLEXPEN [place]) Inject 18 Units into the skin at bedtime.        Marland Kitchen losartan (COZAAR) 100 MG tablet Take 1 tablet (100 mg total) by mouth daily.  90 tablet  3  . metFORMIN (GLUCOPHAGE) 1000 MG tablet TAKE 1 TABLET BY MOUTH TWICE DAILY WITH MEALS  180 tablet  0  . mirtazapine (REMERON) 7.5 MG tablet Take 1 tablet (7.5 mg total) by mouth at bedtime.  90 tablet  3  . Multiple Vitamin (MULITIVITAMIN WITH MINERALS) TABS Take 1 tablet by mouth daily.      . Niacin CR 1000 MG TBCR 1 po bid  60 each  5  . nystatin (MYCOSTATIN) powder Apply topically 4 (four) times daily.  15 g  0  . pantoprazole (PROTONIX) 40 MG tablet Take 1 tablet (40 mg total) by mouth daily.  90 tablet  3  . polyethylene glycol powder (GLYCOLAX/MIRALAX) powder Take 17 g by mouth 2 (two) times daily. Take bid prn  10050 g  1  . VITAMIN E PO Take 1 tablet by mouth daily.      Marland Kitchen zolpidem (AMBIEN) 5 MG tablet Take 1 tablet (5 mg total) by mouth at bedtime as needed for sleep. 1 or 2  At bedtime if needed  30 tablet  0   No current facility-administered  medications for this visit.     Allergies (verified) Penicillins and Sulfonamide derivatives   PAST HISTORY  Family History Family History  Problem Relation Age of  Onset  . Depression    . Cancer      laryngeal  . Diabetes      Social History History  Substance Use Topics  . Smoking status: Never Smoker   . Smokeless tobacco: Never Used  . Alcohol Use: No     Are there smokers in your home (other than you)? No  Risk Factors Current exercise habits: walk  Dietary issues discussed: na   Cardiac risk factors: advanced age (older than 106 for men, 28 for women), diabetes mellitus, dyslipidemia and hypertension.  Depression Screen (Note: if answer to either of the following is "Yes", a more complete depression screening is indicated)   Over the past two weeks, have you felt down, depressed or hopeless? No  Over the past two weeks, have you felt little interest or pleasure in doing things? No  Have you lost interest or pleasure in daily life? No  Do you often feel hopeless? No  Do you cry easily over simple problems? No  Activities of Daily Living In your present state of health, do you have any difficulty performing the following activities?:  Driving? Yes Managing money?  No Feeding yourself? No Getting from bed to chair? No Climbing a flight of stairs? No Preparing food and eating?: No Bathing or showering? No Getting dressed: No Getting to the toilet? No Using the toilet:No Moving around from place to place: No In the past year have you fallen or had a near fall?:No   Are you sexually active?  Yes  Do you have more than one partner?  No  Hearing Difficulties: No Do you often ask people to speak up or repeat themselves? No Do you experience ringing or noises in your ears? No Do you have difficulty understanding soft or whispered voices? No   Do you feel that you have a problem with memory? No  Do you often misplace items? No  Do you feel safe at home?   No  Cognitive Testing  Alert? Yes  Normal Appearance?Yes  Oriented to person? Yes  Place? Yes   Time? Yes  Recall of three objects?  Yes  Can perform simple calculations? Yes  Displays appropriate judgment?Yes  Can read the correct time from a watch face?Yes   Advanced Directives have been discussed with the patient? Yes  List the Names of Other Physician/Practitioners you currently use: 1.  Endo-- [facility] 2.  opht--[facility] 3  Dentist --[place]  Indicate any recent Medical Services you may have received from other than [facility] providers in the past year (date may be approximate).  Immunization History  Administered Date(s) Administered  . Influenza Split 11/24/2010  . Influenza Whole 11/30/2007, 12/05/2008, 12/04/2009  . Influenza,inj,Quad PF,36+ Mos 11/18/2012  . PPD Test 02/16/2011, 04/14/2011  . Pneumococcal Polysaccharide 12/09/2007  . Td 10/12/2006  . Zoster 06/04/2010    Screening Tests Health Maintenance  Topic Date Due  . Hemoglobin A1c  03/06/2012  . Urine Microalbumin  09/03/2012  . Ophthalmology Exam  11/03/2012  . Mammogram  05/16/2013  . Influenza Vaccine  09/23/2013  . Foot Exam  11/18/2013  . Tetanus/tdap  10/11/2016  . Colonoscopy  01/31/2018  . Pneumococcal Polysaccharide Vaccine Age 64 And Over  Completed  . Zostavax  Completed    All answers were reviewed with the patient and necessary referrals were made:  Loreen Freud, DO   11/18/2012   History reviewed:  She  has a past medical history of Diabetes mellitus; Hyperlipidemia; and Hypertension. She  does  not have any pertinent problems on file. She  has past surgical history that includes Appendectomy; surgical sterilization (40 years ago); and Refractive surgery. Her family history includes Cancer in an other family member; Depression in an other family member; Diabetes in an other family member. She  reports that she has never smoked. She has never used smokeless tobacco. She reports that she  does not drink alcohol or use illicit drugs. She has a current medication list which includes the following prescription(s): alendronate, amlodipine, aspirin ec, atorvastatin, calcium-vitamin d, cetirizine hcl, cyclobenzaprine, gabapentin, glimepiride, insulin aspart, insulin detemir, losartan, metformin, mirtazapine, multivitamin with minerals, niacin cr, nystatin, pantoprazole, polyethylene glycol powder, vitamin e, and zolpidem. Current Outpatient Prescriptions on File Prior to Visit  Medication Sig Dispense Refill  . alendronate (FOSAMAX) 70 MG tablet Take 1 tablet (70 mg total) by mouth every 7 (seven) days. Take with a full glass of water on an empty stomach.  12 tablet  3  . amLODipine (NORVASC) 2.5 MG tablet TAKE 1 TABLET BY MOUTH EVERY DAY  90 tablet  0  . aspirin EC 81 MG tablet Take 81 mg by mouth daily.      Marland Kitchen atorvastatin (LIPITOR) 10 MG tablet TAKE 1 TABLET BY MOUTH EVERY DAY  90 tablet  0  . calcium-vitamin D (OSCAL WITH D) 500-200 MG-UNIT per tablet Take 1 tablet by mouth 2 (two) times daily.  60 tablet  0  . Cetirizine HCl (ZYRTEC PO) Take 1 tablet by mouth daily as needed. Patient took this medication for her allergies.      . cyclobenzaprine (FLEXERIL) 10 MG tablet Take 1 tablet (10 mg total) by mouth 3 (three) times daily as needed for muscle spasms.  270 tablet  0  . gabapentin (NEURONTIN) 100 MG capsule Take 1 capsule (100 mg total) by mouth 3 (three) times daily.  270 capsule  3  . glimepiride (AMARYL) 4 MG tablet 1 po bid  180 tablet  3  . insulin aspart (NOVOLOG) 100 UNIT/ML injection Sliding scale  1 vial  3  . Insulin Detemir (LEVEMIR FLEXPEN ) Inject 18 Units into the skin at bedtime.        Marland Kitchen losartan (COZAAR) 100 MG tablet Take 1 tablet (100 mg total) by mouth daily.  90 tablet  3  . metFORMIN (GLUCOPHAGE) 1000 MG tablet TAKE 1 TABLET BY MOUTH TWICE DAILY WITH MEALS  180 tablet  0  . mirtazapine (REMERON) 7.5 MG tablet Take 1 tablet (7.5 mg total) by mouth at bedtime.   90 tablet  3  . Multiple Vitamin (MULITIVITAMIN WITH MINERALS) TABS Take 1 tablet by mouth daily.      . Niacin CR 1000 MG TBCR 1 po bid  60 each  5  . nystatin (MYCOSTATIN) powder Apply topically 4 (four) times daily.  15 g  0  . pantoprazole (PROTONIX) 40 MG tablet Take 1 tablet (40 mg total) by mouth daily.  90 tablet  3  . polyethylene glycol powder (GLYCOLAX/MIRALAX) powder Take 17 g by mouth 2 (two) times daily. Take bid prn  10050 g  1  . VITAMIN E PO Take 1 tablet by mouth daily.      Marland Kitchen zolpidem (AMBIEN) 5 MG tablet Take 1 tablet (5 mg total) by mouth at bedtime as needed for sleep. 1 or 2  At bedtime if needed  30 tablet  0   No current facility-administered medications on file prior to visit.   She is allergic to penicillins  and sulfonamide derivatives.  Review of Systems   Review of Systems  Constitutional: Negative for activity change, appetite change and fatigue.  HENT: Negative for hearing loss, congestion, tinnitus and ear discharge.   Eyes: Negative for visual disturbance (see optho q1y -- vision corrected to 20/20 with glasses).  Respiratory: Negative for cough, chest tightness and shortness of breath.   Cardiovascular: Negative for chest pain, palpitations and leg swelling.  Gastrointestinal: Negative for abdominal pain, diarrhea, constipation and abdominal distention.  Genitourinary: Negative for urgency, frequency, decreased urine volume and difficulty urinating.  Musculoskeletal: Negative for back pain, arthralgias and gait problem.  Skin: Negative for color change, pallor and rash.  Neurological: Negative for dizziness, light-headedness, numbness and headaches.  Hematological: Negative for adenopathy. Does not bruise/bleed easily.  Psychiatric/Behavioral: Negative for suicidal ideas, confusion, sleep disturbance, self-injury, dysphoric mood, decreased concentration and agitation.  Pt is able to read and write and can do all ADLs No risk for falling No abuse/  violence in home      Objective:     Vision by Snellen chart: opth Body mass index is 27.94 kg/(m^2). BP 142/80  Pulse 102  Temp(Src) 98.6 F (37 C) (Oral)  Ht 4' 10.5" (1.486 m)  Wt 136 lb (61.689 kg)  BMI 27.94 kg/m2  SpO2 97%  BP 142/80  Pulse 102  Temp(Src) 98.6 F (37 C) (Oral)  Ht 4' 10.5" (1.486 m)  Wt 136 lb (61.689 kg)  BMI 27.94 kg/m2  SpO2 97% General appearance: alert, cooperative, appears stated age and no distress Head: Normocephalic, without obvious abnormality, atraumatic Eyes: conjunctivae/corneas clear. PERRL, EOM's intact. Fundi benign. Ears: normal TM's and external ear canals both ears Nose: Nares normal. Septum midline. Mucosa normal. No drainage or sinus tenderness. Throat: lips, mucosa, and tongue normal; teeth and gums normal Neck: no adenopathy, no carotid bruit, no JVD, supple, symmetrical, trachea midline and thyroid not enlarged, symmetric, no tenderness/mass/nodules Back: symmetric, no curvature. ROM normal. No CVA tenderness. Lungs: clear to auscultation bilaterally Breasts: normal appearance, no masses or tenderness Heart: regular rate and rhythm, S1, S2 normal, no murmur, click, rub or gallop Abdomen: soft, non-tender; bowel sounds normal; no masses,  no organomegaly Pelvic: not indicated; post-menopausal, no abnormal Pap smears in past Extremities: extremities normal, atraumatic, no cyanosis or edema Pulses: 2+ and symmetric Skin: Skin color, texture, turgor normal. No rashes or lesions Lymph nodes: Cervical, supraclavicular, and axillary nodes normal. Neurologic: Alert and oriented X 3, normal strength and tone. Normal symmetric reflexes. Normal coordination and gait Psych-- no depression, no anxiety      Assessment:     cpe      Plan:     During the course of the visit the patient was educated and counseled about appropriate screening and preventive services including:    Pneumococcal vaccine   Influenza  vaccine  Screening mammography  Bone densitometry screening  Colorectal cancer screening  Diabetes screening  Glaucoma screening  Advanced directives: has an advanced directive - a copy HAS NOT been provided.  Diet review for nutrition referral? Yes ____  Not Indicated _x___   Patient Instructions (the written plan) was given to the patient.  Medicare Attestation I have personally reviewed: The patient's medical and social history Their use of alcohol, tobacco or illicit drugs Their current medications and supplements The patient's functional ability including ADLs,fall risks, home safety risks, cognitive, and hearing and visual impairment Diet and physical activities Evidence for depression or mood disorders  The patient's weight, height, BMI,  and visual acuity have been recorded in the chart.  I have made referrals, counseling, and provided education to the patient based on review of the above and I have provided the patient with a written personalized care plan for preventive services.     Loreen Freud, DO   11/18/2012

## 2012-11-18 NOTE — Assessment & Plan Note (Signed)
Stable con't meds 

## 2012-11-18 NOTE — Patient Instructions (Addendum)
Preventive Care for Adults, Female A healthy lifestyle and preventive care can promote health and wellness. Preventive health guidelines for women include the following key practices.  A routine yearly physical is a good way to check with your caregiver about your health and preventive screening. It is a chance to share any concerns and updates on your health, and to receive a thorough exam.  Visit your dentist for a routine exam and preventive care every 6 months. Brush your teeth twice a day and floss once a day. Good oral hygiene prevents tooth decay and gum disease.  The frequency of eye exams is based on your age, health, family medical history, use of contact lenses, and other factors. Follow your caregiver's recommendations for frequency of eye exams.  Eat a healthy diet. Foods like vegetables, fruits, whole grains, low-fat dairy products, and lean protein foods contain the nutrients you need without too many calories. Decrease your intake of foods high in solid fats, added sugars, and salt. Eat the right amount of calories for you.Get information about a proper diet from your caregiver, if necessary.  Regular physical exercise is one of the most important things you can do for your health. Most adults should get at least 150 minutes of moderate-intensity exercise (any activity that increases your heart rate and causes you to sweat) each week. In addition, most adults need muscle-strengthening exercises on 2 or more days a week.  Maintain a healthy weight. The body mass index (BMI) is a screening tool to identify possible weight problems. It provides an estimate of body fat based on height and weight. Your caregiver can help determine your BMI, and can help you achieve or maintain a healthy weight.For adults 20 years and older:  A BMI below 18.5 is considered underweight.  A BMI of 18.5 to 24.9 is normal.  A BMI of 25 to 29.9 is considered overweight.  A BMI of 30 and above is  considered obese.  Maintain normal blood lipids and cholesterol levels by exercising and minimizing your intake of saturated fat. Eat a balanced diet with plenty of fruit and vegetables. Blood tests for lipids and cholesterol should begin at age 20 and be repeated every 5 years. If your lipid or cholesterol levels are high, you are over 50, or you are at high risk for heart disease, you may need your cholesterol levels checked more frequently.Ongoing high lipid and cholesterol levels should be treated with medicines if diet and exercise are not effective.  If you smoke, find out from your caregiver how to quit. If you do not use tobacco, do not start.  If you are pregnant, do not drink alcohol. If you are breastfeeding, be very cautious about drinking alcohol. If you are not pregnant and choose to drink alcohol, do not exceed 1 drink per day. One drink is considered to be 12 ounces (355 mL) of beer, 5 ounces (148 mL) of wine, or 1.5 ounces (44 mL) of liquor.  Avoid use of street drugs. Do not share needles with anyone. Ask for help if you need support or instructions about stopping the use of drugs.  High blood pressure causes heart disease and increases the risk of stroke. Your blood pressure should be checked at least every 1 to 2 years. Ongoing high blood pressure should be treated with medicines if weight loss and exercise are not effective.  If you are 55 to 76 years old, ask your caregiver if you should take aspirin to prevent strokes.  Diabetes   screening involves taking a blood sample to check your fasting blood sugar level. This should be done once every 3 years, after age 45, if you are within normal weight and without risk factors for diabetes. Testing should be considered at a younger age or be carried out more frequently if you are overweight and have at least 1 risk factor for diabetes.  Breast cancer screening is essential preventive care for women. You should practice "breast  self-awareness." This means understanding the normal appearance and feel of your breasts and may include breast self-examination. Any changes detected, no matter how small, should be reported to a caregiver. Women in their 20s and 30s should have a clinical breast exam (CBE) by a caregiver as part of a regular health exam every 1 to 3 years. After age 40, women should have a CBE every year. Starting at age 40, women should consider having a mammography (breast X-ray test) every year. Women who have a family history of breast cancer should talk to their caregiver about genetic screening. Women at a high risk of breast cancer should talk to their caregivers about having magnetic resonance imaging (MRI) and a mammography every year.  The Pap test is a screening test for cervical cancer. A Pap test can show cell changes on the cervix that might become cervical cancer if left untreated. A Pap test is a procedure in which cells are obtained and examined from the lower end of the uterus (cervix).  Women should have a Pap test starting at age 21.  Between ages 21 and 29, Pap tests should be repeated every 2 years.  Beginning at age 30, you should have a Pap test every 3 years as long as the past 3 Pap tests have been normal.  Some women have medical problems that increase the chance of getting cervical cancer. Talk to your caregiver about these problems. It is especially important to talk to your caregiver if a new problem develops soon after your last Pap test. In these cases, your caregiver may recommend more frequent screening and Pap tests.  The above recommendations are the same for women who have or have not gotten the vaccine for human papillomavirus (HPV).  If you had a hysterectomy for a problem that was not cancer or a condition that could lead to cancer, then you no longer need Pap tests. Even if you no longer need a Pap test, a regular exam is a good idea to make sure no other problems are  starting.  If you are between ages 65 and 70, and you have had normal Pap tests going back 10 years, you no longer need Pap tests. Even if you no longer need a Pap test, a regular exam is a good idea to make sure no other problems are starting.  If you have had past treatment for cervical cancer or a condition that could lead to cancer, you need Pap tests and screening for cancer for at least 20 years after your treatment.  If Pap tests have been discontinued, risk factors (such as a new sexual partner) need to be reassessed to determine if screening should be resumed.  The HPV test is an additional test that may be used for cervical cancer screening. The HPV test looks for the virus that can cause the cell changes on the cervix. The cells collected during the Pap test can be tested for HPV. The HPV test could be used to screen women aged 30 years and older, and should   be used in women of any age who have unclear Pap test results. After the age of 30, women should have HPV testing at the same frequency as a Pap test.  Colorectal cancer can be detected and often prevented. Most routine colorectal cancer screening begins at the age of 50 and continues through age 75. However, your caregiver may recommend screening at an earlier age if you have risk factors for colon cancer. On a yearly basis, your caregiver may provide home test kits to check for hidden blood in the stool. Use of a small camera at the end of a tube, to directly examine the colon (sigmoidoscopy or colonoscopy), can detect the earliest forms of colorectal cancer. Talk to your caregiver about this at age 50, when routine screening begins. Direct examination of the colon should be repeated every 5 to 10 years through age 75, unless early forms of pre-cancerous polyps or small growths are found.  Hepatitis C blood testing is recommended for all people born from 1945 through 1965 and any individual with known risks for hepatitis C.  Practice  safe sex. Use condoms and avoid high-risk sexual practices to reduce the spread of sexually transmitted infections (STIs). STIs include gonorrhea, chlamydia, syphilis, trichomonas, herpes, HPV, and human immunodeficiency virus (HIV). Herpes, HIV, and HPV are viral illnesses that have no cure. They can result in disability, cancer, and death. Sexually active women aged 25 and younger should be checked for chlamydia. Older women with new or multiple partners should also be tested for chlamydia. Testing for other STIs is recommended if you are sexually active and at increased risk.  Osteoporosis is a disease in which the bones lose minerals and strength with aging. This can result in serious bone fractures. The risk of osteoporosis can be identified using a bone density scan. Women ages 65 and over and women at risk for fractures or osteoporosis should discuss screening with their caregivers. Ask your caregiver whether you should take a calcium supplement or vitamin D to reduce the rate of osteoporosis.  Menopause can be associated with physical symptoms and risks. Hormone replacement therapy is available to decrease symptoms and risks. You should talk to your caregiver about whether hormone replacement therapy is right for you.  Use sunscreen with sun protection factor (SPF) of 30 or more. Apply sunscreen liberally and repeatedly throughout the day. You should seek shade when your shadow is shorter than you. Protect yourself by wearing long sleeves, pants, a wide-brimmed hat, and sunglasses year round, whenever you are outdoors.  Once a month, do a whole body skin exam, using a mirror to look at the skin on your back. Notify your caregiver of new moles, moles that have irregular borders, moles that are larger than a pencil eraser, or moles that have changed in shape or color.  Stay current with required immunizations.  Influenza. You need a dose every fall (or winter). The composition of the flu vaccine  changes each year, so being vaccinated once is not enough.  Pneumococcal polysaccharide. You need 1 to 2 doses if you smoke cigarettes or if you have certain chronic medical conditions. You need 1 dose at age 65 (or older) if you have never been vaccinated.  Tetanus, diphtheria, pertussis (Tdap, Td). Get 1 dose of Tdap vaccine if you are younger than age 65, are over 65 and have contact with an infant, are a healthcare worker, are pregnant, or simply want to be protected from whooping cough. After that, you need a Td   booster dose every 10 years. Consult your caregiver if you have not had at least 3 tetanus and diphtheria-containing shots sometime in your life or have a deep or dirty wound.  HPV. You need this vaccine if you are a woman age 26 or younger. The vaccine is given in 3 doses over 6 months.  Measles, mumps, rubella (MMR). You need at least 1 dose of MMR if you were born in 1957 or later. You may also need a second dose.  Meningococcal. If you are age 19 to 21 and a first-year college student living in a residence hall, or have one of several medical conditions, you need to get vaccinated against meningococcal disease. You may also need additional booster doses.  Zoster (shingles). If you are age 60 or older, you should get this vaccine.  Varicella (chickenpox). If you have never had chickenpox or you were vaccinated but received only 1 dose, talk to your caregiver to find out if you need this vaccine.  Hepatitis A. You need this vaccine if you have a specific risk factor for hepatitis A virus infection or you simply wish to be protected from this disease. The vaccine is usually given as 2 doses, 6 to 18 months apart.  Hepatitis B. You need this vaccine if you have a specific risk factor for hepatitis B virus infection or you simply wish to be protected from this disease. The vaccine is given in 3 doses, usually over 6 months. Preventive Services / Frequency Ages 19 to 39  Blood  pressure check.** / Every 1 to 2 years.  Lipid and cholesterol check.** / Every 5 years beginning at age 20.  Clinical breast exam.** / Every 3 years for women in their 20s and 30s.  Pap test.** / Every 2 years from ages 21 through 29. Every 3 years starting at age 30 through age 65 or 70 with a history of 3 consecutive normal Pap tests.  HPV screening.** / Every 3 years from ages 30 through ages 65 to 70 with a history of 3 consecutive normal Pap tests.  Hepatitis C blood test.** / For any individual with known risks for hepatitis C.  Skin self-exam. / Monthly.  Influenza immunization.** / Every year.  Pneumococcal polysaccharide immunization.** / 1 to 2 doses if you smoke cigarettes or if you have certain chronic medical conditions.  Tetanus, diphtheria, pertussis (Tdap, Td) immunization. / A one-time dose of Tdap vaccine. After that, you need a Td booster dose every 10 years.  HPV immunization. / 3 doses over 6 months, if you are 26 and younger.  Measles, mumps, rubella (MMR) immunization. / You need at least 1 dose of MMR if you were born in 1957 or later. You may also need a second dose.  Meningococcal immunization. / 1 dose if you are age 19 to 21 and a first-year college student living in a residence hall, or have one of several medical conditions, you need to get vaccinated against meningococcal disease. You may also need additional booster doses.  Varicella immunization.** / Consult your caregiver.  Hepatitis A immunization.** / Consult your caregiver. 2 doses, 6 to 18 months apart.  Hepatitis B immunization.** / Consult your caregiver. 3 doses usually over 6 months. Ages 40 to 64  Blood pressure check.** / Every 1 to 2 years.  Lipid and cholesterol check.** / Every 5 years beginning at age 20.  Clinical breast exam.** / Every year after age 40.  Mammogram.** / Every year beginning at age 40   and continuing for as long as you are in good health. Consult with your  caregiver.  Pap test.** / Every 3 years starting at age 30 through age 65 or 70 with a history of 3 consecutive normal Pap tests.  HPV screening.** / Every 3 years from ages 30 through ages 65 to 70 with a history of 3 consecutive normal Pap tests.  Fecal occult blood test (FOBT) of stool. / Every year beginning at age 50 and continuing until age 75. You may not need to do this test if you get a colonoscopy every 10 years.  Flexible sigmoidoscopy or colonoscopy.** / Every 5 years for a flexible sigmoidoscopy or every 10 years for a colonoscopy beginning at age 50 and continuing until age 75.  Hepatitis C blood test.** / For all people born from 1945 through 1965 and any individual with known risks for hepatitis C.  Skin self-exam. / Monthly.  Influenza immunization.** / Every year.  Pneumococcal polysaccharide immunization.** / 1 to 2 doses if you smoke cigarettes or if you have certain chronic medical conditions.  Tetanus, diphtheria, pertussis (Tdap, Td) immunization.** / A one-time dose of Tdap vaccine. After that, you need a Td booster dose every 10 years.  Measles, mumps, rubella (MMR) immunization. / You need at least 1 dose of MMR if you were born in 1957 or later. You may also need a second dose.  Varicella immunization.** / Consult your caregiver.  Meningococcal immunization.** / Consult your caregiver.  Hepatitis A immunization.** / Consult your caregiver. 2 doses, 6 to 18 months apart.  Hepatitis B immunization.** / Consult your caregiver. 3 doses, usually over 6 months. Ages 65 and over  Blood pressure check.** / Every 1 to 2 years.  Lipid and cholesterol check.** / Every 5 years beginning at age 20.  Clinical breast exam.** / Every year after age 40.  Mammogram.** / Every year beginning at age 40 and continuing for as long as you are in good health. Consult with your caregiver.  Pap test.** / Every 3 years starting at age 30 through age 65 or 70 with a 3  consecutive normal Pap tests. Testing can be stopped between 65 and 70 with 3 consecutive normal Pap tests and no abnormal Pap or HPV tests in the past 10 years.  HPV screening.** / Every 3 years from ages 30 through ages 65 or 70 with a history of 3 consecutive normal Pap tests. Testing can be stopped between 65 and 70 with 3 consecutive normal Pap tests and no abnormal Pap or HPV tests in the past 10 years.  Fecal occult blood test (FOBT) of stool. / Every year beginning at age 50 and continuing until age 75. You may not need to do this test if you get a colonoscopy every 10 years.  Flexible sigmoidoscopy or colonoscopy.** / Every 5 years for a flexible sigmoidoscopy or every 10 years for a colonoscopy beginning at age 50 and continuing until age 75.  Hepatitis C blood test.** / For all people born from 1945 through 1965 and any individual with known risks for hepatitis C.  Osteoporosis screening.** / A one-time screening for women ages 65 and over and women at risk for fractures or osteoporosis.  Skin self-exam. / Monthly.  Influenza immunization.** / Every year.  Pneumococcal polysaccharide immunization.** / 1 dose at age 65 (or older) if you have never been vaccinated.  Tetanus, diphtheria, pertussis (Tdap, Td) immunization. / A one-time dose of Tdap vaccine if you are over   65 and have contact with an infant, are a healthcare worker, or simply want to be protected from whooping cough. After that, you need a Td booster dose every 10 years.  Varicella immunization.** / Consult your caregiver.  Meningococcal immunization.** / Consult your caregiver.  Hepatitis A immunization.** / Consult your caregiver. 2 doses, 6 to 18 months apart.  Hepatitis B immunization.** / Check with your caregiver. 3 doses, usually over 6 months. ** Family history and personal history of risk and conditions may change your caregiver's recommendations. Document Released: 04/07/2001 Document Revised: 05/04/2011  Document Reviewed: 07/07/2010 [employer] Patient Information 2014 [employer]  

## 2012-11-18 NOTE — Assessment & Plan Note (Signed)
Check labs con't meds 

## 2012-11-21 ENCOUNTER — Encounter: Payer: Self-pay | Admitting: Endocrinology

## 2012-11-21 ENCOUNTER — Other Ambulatory Visit (INDEPENDENT_AMBULATORY_CARE_PROVIDER_SITE_OTHER): Payer: Medicare Other

## 2012-11-21 ENCOUNTER — Ambulatory Visit (INDEPENDENT_AMBULATORY_CARE_PROVIDER_SITE_OTHER): Payer: Medicare Other | Admitting: Endocrinology

## 2012-11-21 ENCOUNTER — Ambulatory Visit: Payer: Medicare Other | Attending: Family Medicine | Admitting: Physical Therapy

## 2012-11-21 VITALS — BP 164/80 | HR 104 | Temp 98.3°F | Resp 12 | Ht 58.5 in | Wt 135.5 lb

## 2012-11-21 DIAGNOSIS — IMO0001 Reserved for inherently not codable concepts without codable children: Secondary | ICD-10-CM | POA: Diagnosis not present

## 2012-11-21 DIAGNOSIS — E1165 Type 2 diabetes mellitus with hyperglycemia: Secondary | ICD-10-CM

## 2012-11-21 DIAGNOSIS — M545 Low back pain, unspecified: Secondary | ICD-10-CM | POA: Diagnosis not present

## 2012-11-21 DIAGNOSIS — E1149 Type 2 diabetes mellitus with other diabetic neurological complication: Secondary | ICD-10-CM

## 2012-11-21 DIAGNOSIS — M542 Cervicalgia: Secondary | ICD-10-CM | POA: Insufficient documentation

## 2012-11-21 DIAGNOSIS — E785 Hyperlipidemia, unspecified: Secondary | ICD-10-CM | POA: Diagnosis not present

## 2012-11-21 LAB — HEPATIC FUNCTION PANEL
ALT: 16 U/L (ref 0–35)
Bilirubin, Direct: 0.2 mg/dL (ref 0.0–0.3)
Total Bilirubin: 1.1 mg/dL (ref 0.3–1.2)

## 2012-11-21 LAB — CBC WITH DIFFERENTIAL/PLATELET
Basophils Absolute: 0 10*3/uL (ref 0.0–0.1)
Eosinophils Relative: 3.1 % (ref 0.0–5.0)
MCV: 82.7 fl (ref 78.0–100.0)
Monocytes Absolute: 0.6 10*3/uL (ref 0.1–1.0)
Monocytes Relative: 8.5 % (ref 3.0–12.0)
Neutrophils Relative %: 60 % (ref 43.0–77.0)
Platelets: 275 10*3/uL (ref 150.0–400.0)
WBC: 7 10*3/uL (ref 4.5–10.5)

## 2012-11-21 LAB — BASIC METABOLIC PANEL
BUN: 10 mg/dL (ref 6–23)
Creatinine, Ser: 0.6 mg/dL (ref 0.4–1.2)
GFR: 105.3 mL/min (ref >60.00)

## 2012-11-21 LAB — LIPID PANEL
LDL Cholesterol: 45 mg/dL (ref 0–99)
Total CHOL/HDL Ratio: 2
Triglycerides: 70 mg/dL (ref 0.0–149.0)
VLDL: 14 mg/dL (ref 0.0–40.0)

## 2012-11-21 NOTE — Progress Notes (Addendum)
Patient ID: Mary Flynn, female   DOB: [birth year], 78 y.o.   MRN: [id number]  Mary Flynn is an 78 y.o. female.   Reason for Appointment: Diabetes follow-up   History of Present Illness   Diagnosis: Type 2 DIABETES MELITUS, date of diagnosis:   1995    Previous history: She has been on various regimens of hypoglycemic drugs over the last several years including Actos, Amaryl and Januvia. Because of cost her Januvia was stopped and Actos was stopped because of potential weight gain She was initially started on Levemir insulin because of fasting hyperglycemia in 2009 and the dose has been adjusted periodically In the last year she is also quite mealtime coverage because of postprandial hyperglycemia despite usually eating a low carbohydrate diet  Recent history: Blood glucose control is fair but difficult to assess since her A1c is not available today She has not been checking her blood sugar much lately and mostly in the morning. She does have occasional high readings and this could be from her forgetting to take her Levemir at night occasionally She feels a little tired, weak and hungry around 5 PM and this is relieved by eating food. However she does not check her blood sugar at this time Previously was told to take 6 units of NovoLog at lunch time but is now taking 8 units She has been fairly compliant with exercise although inconsistent with diet when out of town and has gained a little weight back     Oral hypoglycemic drugs:Amaryl, metformin  Side effects from medications: None Insulin regimen: Levemir           Proper timing of medications in relation to meals: Yes.          Monitors blood glucose: Once a day.    Glucometer:Accu        Blood Glucose readings from meter download: readings before breakfast: 107-192 with average 152, lunchtime 123, 283  Hypoglycemia frequency: once or twice a week        Meals: 3 meals per day. she is a vegetarian. Has lunch around 2 PM      Physical  activity: exercise: walking 6/7 days a week, 4 miles           Last visit with dietitian: 2009  The last HbgA1c was reported as 7.4 in 1/14   Lab Results  Component Value Date   HGBA1C 7.7* 09/04/2011    Wt Readings from Last 3 Encounters:  11/21/12 135 lb 8 oz (61.462 kg)  11/18/12 136 lb (61.689 kg)  07/26/12 135 lb 9.6 oz (61.508 kg)    LABS:  No visits with results within 1 Week(s) from this visit. Latest known visit with results is:  Office Visit on 07/26/2012  Component Date Value Range Status  . Color, UA 07/26/2012 Yellow   Final  . Clarity, UA 07/26/2012 Clear   Final  . Glucose, UA 07/26/2012 Neg   Final  . Bilirubin, UA 07/26/2012 Neg   Final  . Ketones, UA 07/26/2012 Neg   Final  . Spec Grav, UA 07/26/2012 <=1.005   Final  . Blood, UA 07/26/2012 Neg   Final  . pH, UA 07/26/2012 6.5   Final  . Protein, UA 07/26/2012 Neg   Final  . Urobilinogen, UA 07/26/2012 0.2   Final  . Nitrite, UA 07/26/2012 Neg   Final  . Leukocytes, UA 07/26/2012 large (3+)   Final  . Culture 07/26/2012 ESCHERICHIA COLI   Final  . Colony Count  07/26/2012 >=100,000 COLONIES/ML   Final  . Organism ID, Bacteria 07/26/2012 ESCHERICHIA COLI   Final      Medication List       This list is accurate as of: 11/21/12 10:08 AM.  Always use your most recent med list.               alendronate 70 MG tablet  Commonly known as:  FOSAMAX  Take 1 tablet (70 mg total) by mouth every 7 (seven) days. Take with a full glass of water on an empty stomach.     FOSAMAX 70 MG tablet  Generic drug:  alendronate  70 mg.     Amlodipine & Diet Manage Prod 2.5 MG Misc     amLODipine 2.5 MG tablet  Commonly known as:  NORVASC  TAKE 1 TABLET BY MOUTH EVERY DAY     aspirin EC 81 MG tablet  Take 81 mg by mouth daily.     atorvastatin 10 MG tablet  Commonly known as:  LIPITOR  TAKE 1 TABLET BY MOUTH EVERY DAY     LIPITOR 10 MG tablet  Generic drug:  atorvastatin     calcium-vitamin D 500-200  MG-UNIT per tablet  Commonly known as:  OSCAL WITH D  Take 1 tablet by mouth 2 (two) times daily.     cyclobenzaprine 10 MG tablet  Commonly known as:  FLEXERIL  Take 1 tablet (10 mg total) by mouth 3 (three) times daily as needed for muscle spasms.     gabapentin 100 MG capsule  Commonly known as:  NEURONTIN  1 po bid     glimepiride 4 MG tablet  Commonly known as:  AMARYL  1 po bid     insulin aspart 100 UNIT/ML injection  Commonly known as:  novoLOG  8-10 Units daily. Sliding scale     LEVEMIR FLEXPEN [place]  Inject 18 Units into the skin at bedtime.     losartan 100 MG tablet  Commonly known as:  COZAAR  Take 1 tablet (100 mg total) by mouth daily.     metFORMIN 1000 MG tablet  Commonly known as:  GLUCOPHAGE  TAKE 1 TABLET BY MOUTH TWICE DAILY WITH MEALS     GLUCOPHAGE 1000 MG tablet  Generic drug:  metFORMIN  2 (two) times daily.     mirtazapine 7.5 MG tablet  Commonly known as:  REMERON  Take 1 tablet (7.5 mg total) by mouth at bedtime.     multivitamin with minerals Tabs tablet  Take 1 tablet by mouth daily.     Niacin CR 1000 MG Tbcr  1 po bid     nystatin powder  Commonly known as:  MYCOSTATIN  Apply topically 4 (four) times daily.     pantoprazole 40 MG tablet  Commonly known as:  PROTONIX  Take 1 tablet (40 mg total) by mouth daily.     polyethylene glycol powder powder  Commonly known as:  GLYCOLAX/MIRALAX  Take 17 g by mouth 2 (two) times daily. Take bid prn     VITAMIN E PO  Take 1 tablet by mouth daily.     zolpidem 5 MG tablet  Commonly known as:  AMBIEN  Take 1 tablet (5 mg total) by mouth at bedtime as needed for sleep. 1 or 2  At bedtime if needed     ZYRTEC PO  Take 1 tablet by mouth daily as needed. Patient took this medication for her allergies.  Allergies:  Allergies  Allergen Reactions  . Penicillins   . Sulfonamide Derivatives     Past Medical History  Diagnosis Date  . Diabetes mellitus   . Hyperlipidemia   .  Hypertension     Past Surgical History  Procedure Laterality Date  . Appendectomy    . Surgical sterilization  40 years ago  . Refractive surgery      Left Eye    Family History  Problem Relation Age of Onset  . Depression    . Cancer      laryngeal  . Diabetes      Social History:  reports that she has never smoked. She has never used smokeless tobacco. She reports that she does not drink alcohol or use illicit drugs.  Review of Systems:  Hypertension:  Appears to have high blood pressure today, generally tends to have whitecoat syndrome, is followed by PCP  Lipids: Has had diabetic dyslipidemia, usually well controlled, last LDL 37 and HDL 52  Neuropathy: She has had some sharp pains in her lower legs but no numbness or attending. These are controlled with gabapentin Noticing swelling of feet History of GERD She has been treated with Fosamax by PCP for osteopenia/osteoporosis       Examination:   BP 164/80  Pulse 104  Temp(Src) 98.3 F (36.8 C)  Resp 12  Ht 4' 10.5" (1.486 m)  Wt 135 lb 8 oz (61.462 kg)  BMI 27.83 kg/m2  SpO2 96%  Body mass index is 27.83 kg/(m^2).    ASSESSMENT/ PLAN::   Diabetes type 2   Blood glucose control is fair and A1c is elevated She has not been checking her blood sugar much lately and mostly in the morning. He does have occasional high readings and this could be from her forgetting to take her Levemir Currently she is having hypoglycemia-like symptoms after 8 units of NovoLog at lunchtime but this is not confirmed. She has been fairly compliant with exercise although inconsistent with diet and has gained a little weight back She is having occasional difficulties with her remembering her insulin doses  Plan: She will continue Amaryl and metformin unchanged Continue her Levemir unchanged as blood sugars in the mornings are reasonably good Discussed fasting blood sugar targets and adjustment of Levemir Reduce lunchtime coverage to  5 units and checked readings after lunch. Discussed blood sugar targets and adjustment of dose by 1-2 units if needed Controlled portions and balanced meals More blood sugars after breakfast and supper Review other labs and  will discuss with the patient as needed  Given blood sugar diary to keep a record of her insulin and blood sugars which will help her compliance and adjustment of insulin as discussed  Counseling time over 50% of today's 25 minute visit   Mary Flynn 11/21/2012, 10:08 AM   Addendum: A1c is 9.2. Patient informed and she will need to be monitoring her blood sugar twice a day at various times to assess control better and followup in 4 weeks

## 2012-11-21 NOTE — Patient Instructions (Addendum)
Keep diary of sugars and insulin doses  Please check blood sugars at least half the time about 2 hours after any meal and as directed on waking up. AM sugar target 90-140 Please bring blood sugar monitor to each visit  Novolog 5 units before lunch; sugar at 5-6 pm should be 120-160 range, may go up or down 1-2 units if blood sugar higher or lower

## 2012-11-22 ENCOUNTER — Telehealth: Payer: Self-pay | Admitting: Endocrinology

## 2012-11-22 LAB — POCT URINALYSIS DIPSTICK
Blood, UA: NEGATIVE
Glucose, UA: NEGATIVE
Nitrite, UA: NEGATIVE
Protein, UA: NEGATIVE
Urobilinogen, UA: 0.2
pH, UA: 8

## 2012-11-22 NOTE — Telephone Encounter (Signed)
Pt is aware of results. 

## 2012-11-22 NOTE — Telephone Encounter (Signed)
Please let her know that A1c is very high at 9.2, she needs to check her blood sugar consistently 2 hours after one of her meals daily Also needs to followup in 4 weeks with me to review

## 2012-11-28 ENCOUNTER — Ambulatory Visit: Payer: Medicare Other | Attending: Family Medicine | Admitting: Physical Therapy

## 2012-11-28 DIAGNOSIS — IMO0001 Reserved for inherently not codable concepts without codable children: Secondary | ICD-10-CM | POA: Diagnosis not present

## 2012-11-28 DIAGNOSIS — M545 Low back pain, unspecified: Secondary | ICD-10-CM | POA: Insufficient documentation

## 2012-11-28 DIAGNOSIS — M542 Cervicalgia: Secondary | ICD-10-CM | POA: Diagnosis not present

## 2012-11-29 DIAGNOSIS — H04129 Dry eye syndrome of unspecified lacrimal gland: Secondary | ICD-10-CM | POA: Diagnosis not present

## 2012-12-01 ENCOUNTER — Ambulatory Visit: Payer: Medicare Other | Admitting: Physical Therapy

## 2012-12-05 ENCOUNTER — Other Ambulatory Visit: Payer: Self-pay | Admitting: *Deleted

## 2012-12-05 ENCOUNTER — Other Ambulatory Visit: Payer: Self-pay | Admitting: Family Medicine

## 2012-12-05 ENCOUNTER — Ambulatory Visit: Payer: Medicare Other | Admitting: Physical Therapy

## 2012-12-05 ENCOUNTER — Other Ambulatory Visit: Payer: Self-pay

## 2012-12-05 DIAGNOSIS — R51 Headache: Secondary | ICD-10-CM

## 2012-12-05 MED ORDER — GABAPENTIN 100 MG PO CAPS: ORAL_CAPSULE | ORAL | Status: DC

## 2012-12-05 MED ORDER — CYCLOBENZAPRINE HCL 10 MG PO TABS: 10.0000 mg | ORAL_TABLET | Freq: Three times a day (TID) | ORAL | Status: DC | PRN

## 2012-12-05 NOTE — Telephone Encounter (Signed)
12/05/2012  Pt is here with husband for his appt.  She needs medication refills on cyclobenzaprine (FLEXERIL) 10 MG tablet and gabapentin (NEURONTIN) 100 MG capsule, 3 month supply on both.  She would like the paper copy to take to the pharmacy.  Thank you.  bw

## 2012-12-05 NOTE — Telephone Encounter (Signed)
Last seen 11/18/12 and filled 05/30/12  #270 for a 90 day supply. Patient is in the office     KP

## 2012-12-06 DIAGNOSIS — H579 Unspecified disorder of eye and adnexa: Secondary | ICD-10-CM | POA: Diagnosis not present

## 2012-12-06 DIAGNOSIS — H16109 Unspecified superficial keratitis, unspecified eye: Secondary | ICD-10-CM | POA: Diagnosis not present

## 2012-12-06 DIAGNOSIS — Z961 Presence of intraocular lens: Secondary | ICD-10-CM | POA: Diagnosis not present

## 2012-12-06 DIAGNOSIS — H04129 Dry eye syndrome of unspecified lacrimal gland: Secondary | ICD-10-CM | POA: Diagnosis not present

## 2012-12-07 ENCOUNTER — Ambulatory Visit: Payer: Medicare Other | Admitting: Physical Therapy

## 2012-12-13 ENCOUNTER — Telehealth: Payer: Self-pay | Admitting: General Practice

## 2012-12-13 ENCOUNTER — Ambulatory Visit: Payer: Medicare Other | Admitting: Physical Therapy

## 2012-12-13 NOTE — Telephone Encounter (Signed)
Called pt to verify if she needs this [facility]. Also to verify if she takes 2 or 3 times daily. Per kim med was handed to her at last OV and med was filled by [facility] on 10/13.

## 2012-12-15 ENCOUNTER — Ambulatory Visit: Payer: Medicare Other | Admitting: Physical Therapy

## 2012-12-15 NOTE — Telephone Encounter (Signed)
Called pt again today to verify. LMOVM to return call.

## 2012-12-16 ENCOUNTER — Other Ambulatory Visit: Payer: Self-pay

## 2012-12-16 MED ORDER — GABAPENTIN 100 MG PO CAPS: ORAL_CAPSULE | ORAL | Status: DC

## 2012-12-16 NOTE — Telephone Encounter (Signed)
Rx Gabapentin was given to the patient with the incorrect directions of BID #270 and the pharmacy gave the patient #180 instead of TID #270. Rx reprinted for TID #270 and given to the patient.

## 2012-12-17 NOTE — Telephone Encounter (Signed)
Cannot reach pt, closing encounter until pt returns call.  

## 2012-12-19 ENCOUNTER — Telehealth: Payer: Self-pay | Admitting: Internal Medicine

## 2012-12-19 NOTE — Telephone Encounter (Signed)
Message taken on wrong pt. Will sign off this

## 2012-12-20 ENCOUNTER — Ambulatory Visit: Payer: Medicare Other | Admitting: Physical Therapy

## 2012-12-22 ENCOUNTER — Ambulatory Visit: Payer: Medicare Other | Admitting: Physical Therapy

## 2012-12-27 ENCOUNTER — Ambulatory Visit: Payer: Medicare Other | Attending: Family Medicine | Admitting: Physical Therapy

## 2012-12-27 DIAGNOSIS — M545 Low back pain, unspecified: Secondary | ICD-10-CM | POA: Insufficient documentation

## 2012-12-27 DIAGNOSIS — IMO0001 Reserved for inherently not codable concepts without codable children: Secondary | ICD-10-CM | POA: Insufficient documentation

## 2012-12-27 DIAGNOSIS — M542 Cervicalgia: Secondary | ICD-10-CM | POA: Diagnosis not present

## 2012-12-29 ENCOUNTER — Ambulatory Visit: Payer: Medicare Other | Admitting: Physical Therapy

## 2013-01-02 ENCOUNTER — Ambulatory Visit: Payer: Medicare Other | Admitting: Physical Therapy

## 2013-01-02 ENCOUNTER — Encounter: Payer: Self-pay | Admitting: Internal Medicine

## 2013-01-02 ENCOUNTER — Ambulatory Visit (INDEPENDENT_AMBULATORY_CARE_PROVIDER_SITE_OTHER): Payer: Medicare Other | Admitting: Internal Medicine

## 2013-01-02 VITALS — BP 143/84 | HR 97 | Temp 98.1°F | Wt 137.2 lb

## 2013-01-02 DIAGNOSIS — M545 Low back pain: Secondary | ICD-10-CM | POA: Diagnosis not present

## 2013-01-02 DIAGNOSIS — M542 Cervicalgia: Secondary | ICD-10-CM | POA: Diagnosis not present

## 2013-01-02 DIAGNOSIS — R05 Cough: Secondary | ICD-10-CM

## 2013-01-02 DIAGNOSIS — R059 Cough, unspecified: Secondary | ICD-10-CM

## 2013-01-02 DIAGNOSIS — IMO0001 Reserved for inherently not codable concepts without codable children: Secondary | ICD-10-CM | POA: Diagnosis not present

## 2013-01-02 MED ORDER — AZITHROMYCIN 250 MG PO TABS: ORAL_TABLET | ORAL | Status: DC

## 2013-01-02 MED ORDER — AZELASTINE HCL 0.1 % NA SOLN: 2.0000 | Freq: Two times a day (BID) | NASAL | Status: DC | PRN

## 2013-01-02 NOTE — Progress Notes (Signed)
Pre visit review using our clinic review tool, if applicable. No additional management support is needed unless otherwise documented below in the visit note. 

## 2013-01-02 NOTE — Patient Instructions (Addendum)
Rest, fluids , tylenol For cough, take Mucinex DM twice a day until better  For congestion use astelin nasal spray twice a day until you feel better Take the antibiotic as prescribed  (zithromax) Call if no better in few days Call anytime if the symptoms are severe   Get the XR at [facility], corner of [address] (10 minutes form here); they are open 24/7 [address]  [place], [place] [address] [phone]

## 2013-01-02 NOTE — Progress Notes (Signed)
  Subjective:    Patient ID: Mary Flynn, female    DOB: [birth year], 78 y.o.   MRN: [id number]  HPI  Acute visit, symptoms started 2 weeks ago: Postnasal dripping with thick white mucus that cause  cough. Has not taken any specific medication but feels like she needs an antibiotic.  Past Medical History  Diagnosis Date  . Diabetes mellitus   . Hyperlipidemia   . Hypertension    Past Surgical History  Procedure Laterality Date  . Appendectomy    . Surgical sterilization  40 years ago  . Refractive surgery      Left Eye     Review of Systems Fever or chills Some sore throat. + Chest congestion and mild shortness of breath, SOB, patient thinks,  is related to nose congestion. Denies nausea, vomiting, diarrhea. No recent foreign trips     Objective:   Physical Exam BP 143/84  Pulse 97  Temp(Src) 98.1 F (36.7 C)  Wt 137 lb 3.2 oz (62.234 kg)  SpO2 97% General -- alert, well-developed, NAD.   HEENT-- Not pale. TMs normal, throat symmetric, no redness or discharge. Face symmetric, sinuses mildly tender to palpation throughout. Nose + congestion.  Lungs -- normal respiratory effort, no intercostal retractions, no accessory muscle use, and normal breath sounds.  Heart-- normal rate, regular rhythm, no murmur.   Extremities-- no pretibial edema bilaterally   Psych-- Cognition and judgment appear intact. Cooperative with normal attention span and concentration. No anxious appearing , no depressed appearing.     Assessment & Plan:   URI, mild sinusitis,   Upper respiratory symptoms for 2 weeks, mild sinusitis?. Patient is allergic to penicillin and sulfa. Plan: Z-Pak, see instructions, if not better will need a different antibiotic. Addendum--patient strongly requests a chest x-ray d/t cough for 2 weeks, will arrange

## 2013-01-03 ENCOUNTER — Ambulatory Visit (HOSPITAL_BASED_OUTPATIENT_CLINIC_OR_DEPARTMENT_OTHER)
Admission: RE | Admit: 2013-01-03 | Discharge: 2013-01-03 | Disposition: A | Discharge status: Home / Self Care | Payer: Medicare Other | Source: Ambulatory Visit | Attending: Internal Medicine | Admitting: Internal Medicine

## 2013-01-03 DIAGNOSIS — I10 Essential (primary) hypertension: Secondary | ICD-10-CM | POA: Diagnosis not present

## 2013-01-03 DIAGNOSIS — R059 Cough, unspecified: Secondary | ICD-10-CM | POA: Insufficient documentation

## 2013-01-03 DIAGNOSIS — J984 Other disorders of lung: Secondary | ICD-10-CM | POA: Diagnosis not present

## 2013-01-03 DIAGNOSIS — E119 Type 2 diabetes mellitus without complications: Secondary | ICD-10-CM | POA: Insufficient documentation

## 2013-01-03 DIAGNOSIS — R05 Cough: Secondary | ICD-10-CM

## 2013-01-05 ENCOUNTER — Ambulatory Visit: Payer: Medicare Other | Admitting: Physical Therapy

## 2013-01-05 ENCOUNTER — Encounter: Payer: Medicare Other | Admitting: Physical Therapy

## 2013-01-05 DIAGNOSIS — IMO0001 Reserved for inherently not codable concepts without codable children: Secondary | ICD-10-CM | POA: Diagnosis not present

## 2013-01-05 DIAGNOSIS — M542 Cervicalgia: Secondary | ICD-10-CM | POA: Diagnosis not present

## 2013-01-05 DIAGNOSIS — M545 Low back pain: Secondary | ICD-10-CM | POA: Diagnosis not present

## 2013-01-09 ENCOUNTER — Ambulatory Visit: Payer: Medicare Other | Admitting: Family Medicine

## 2013-01-11 ENCOUNTER — Ambulatory Visit (INDEPENDENT_AMBULATORY_CARE_PROVIDER_SITE_OTHER)
Admission: RE | Admit: 2013-01-11 | Discharge: 2013-01-11 | Disposition: A | Discharge status: Home / Self Care | Payer: Medicare Other | Source: Ambulatory Visit | Attending: Internal Medicine | Admitting: Internal Medicine

## 2013-01-11 ENCOUNTER — Encounter: Payer: Self-pay | Admitting: Internal Medicine

## 2013-01-11 ENCOUNTER — Ambulatory Visit (INDEPENDENT_AMBULATORY_CARE_PROVIDER_SITE_OTHER): Payer: Medicare Other | Admitting: Internal Medicine

## 2013-01-11 VITALS — BP 144/82 | HR 113 | Temp 97.7°F | Ht 59.0 in | Wt 138.8 lb

## 2013-01-11 DIAGNOSIS — J9819 Other pulmonary collapse: Secondary | ICD-10-CM | POA: Diagnosis not present

## 2013-01-11 DIAGNOSIS — I10 Essential (primary) hypertension: Secondary | ICD-10-CM | POA: Diagnosis not present

## 2013-01-11 DIAGNOSIS — R059 Cough, unspecified: Secondary | ICD-10-CM

## 2013-01-11 DIAGNOSIS — R05 Cough: Secondary | ICD-10-CM | POA: Diagnosis not present

## 2013-01-11 MED ORDER — OLMESARTAN MEDOXOMIL 20 MG PO TABS: 20.0000 mg | ORAL_TABLET | Freq: Every day | ORAL | Status: DC

## 2013-01-11 MED ORDER — TRAMADOL HCL 50 MG PO TABS: ORAL_TABLET | ORAL | Status: DC

## 2013-01-11 NOTE — Assessment & Plan Note (Addendum)
The most common causes of chronic cough in immunocompetent adults include the following: upper airway cough syndrome (UACS), previously referred to as postnasal drip syndrome (PNDS), which is caused by variety of rhinosinus conditions; (2) asthma; (3) GERD; (4) chronic bronchitis from cigarette smoking or other inhaled environmental irritants; (5) nonasthmatic eosinophilic bronchitis; and (6) bronchiectasis.   These conditions, singly or in combination, have accounted for up to 94% of the causes of chronic cough in prospective studies.   Other conditions have constituted no >6% of the causes in prospective studies These have included bronchogenic carcinoma, chronic interstitial pneumonia, sarcoidosis, left ventricular failure, ACEI-induced cough, and aspiration from a condition associated with pharyngeal dysfunction.    Chronic cough is often simultaneously caused by more than one condition. A single cause has been found from 38 to 82% of the time, multiple causes from 18 to 62%. Multiply caused cough has been the result of three diseases up to 42% of the time.       Most likely this is  Classic Upper airway cough syndrome, so named because it's frequently impossible to sort out how much is  CR/sinusitis with freq throat clearing (which can be related to primary GERD)   vs  causing  secondary (" extra esophageal")  GERD from wide swings in gastric pressure that occur with throat clearing, often  promoting self use of mint and menthol lozenges that reduce the lower esophageal sphincter tone and exacerbate the problem further in a cyclical fashion.   These are the same pts (now being labeled as having "irritable larynx syndrome" by some cough centers) who not infrequently have a history of having failed to tolerate ace inhibitors,  dry powder inhalers or biphosphonates or report having atypical reflux symptoms that don't respond to standard doses of PPI , and are easily confused as having aecopd or asthma  flares by even experienced allergists/ pulmonologists.  For now max rx for gerd to include elimination of calcium carb, CCB, fosfamax and add 1st gen h1 at hs plus increase neurontin toward max of 300 mg tid.  The standardized cough guidelines published in Chest by [place] in 2006 are still the best available and consist of a multiple step process (up to 12!) , not a single office visit,  and are intended  to address this problem logically,  with an alogrithm dependent on response to empiric treatment at  each progressive step  to determine a specific diagnosis with  minimal addtional testing needed. Therefore if adherence is an issue or can't be accurately verified,  it's very unlikely the standard evaluation and treatment will be successful here.    Furthermore, response to therapy (other than acute cough suppression, which should only be used short term with avoidance of narcotic containing cough syrups if possible), can be a gradual process for which the patient may perceive immediate benefit.  Unlike going to an eye doctor where the best perscription is almost always the first one and is immediately effective, this is almost never the case in the management of chronic cough syndromes. Therefore the patient needs to commit up front to consistently adhere to recommendations  for up to 6 weeks of therapy directed at the likely underlying problem(s) before the response can be reasonably evaluated.

## 2013-01-11 NOTE — Progress Notes (Signed)
Subjective:     Patient ID: Mary Flynn, female   DOB: 12/22/1934 MRN: [id number]  HPI   78 yo [place] female no childhood resp problems including pregancy then around 2004 began a pattern of recurrent cyclical pattern lasting from sev  months up to a year and then gone up to 4-5 months self referred to pulmonary clinic 01/11/2013 for re-eval of recurrent persistent cough   01/11/2013 1st [facility] office visit/ Keyundra Fant in EMR era with recurrent cough Chief Complaint  Patient presents with  . Acute Visit    Pt c/o increased cough x 2 wks- prod with large amounts of clear sputum.  She states that she coughs until she gags and vomits.   cough tends to be worse with any thing in her mouth and with talking.  Actual mucus production is min and "nothing ever works" to include abx, prednisone, inhalers, gerd rx. eval at [facility] with conclusion "this isn't reflux" Already eval 01/02/13 by Dr Drue Novel rx with pack with sorethroat that has resolved    No obvious pattern in  day to day or daytime variabilty or assoc sob unless coughing or cp or chest tightness, subjective wheeze overt sinus or hb symptoms. No unusual exp hx or h/o childhood pna/ asthma or knowledge of premature birth.  Sleeping ok without nocturnal  or early am exacerbation  of respiratory  c/o's or need for noct saba. Also denies any obvious fluctuation of symptoms with weather or environmental changes or other aggravating or alleviating factors except as outlined above   Current Medications, Allergies, Complete Past Medical History, Past Surgical History, Family History, and Social History were reviewed in [employer] record.  ROS  The following are not active complaints unless bolded sore throat, dysphagia, dental problems, itching, sneezing,  nasal congestion or excess/ purulent secretions, ear ache,   fever, chills, sweats, unintended wt loss, pleuritic or exertional cp, hemoptysis,  orthopnea pnd or leg swelling,  presyncope, palpitations, heartburn, abdominal pain, anorexia, nausea, vomiting, diarrhea  or change in bowel or urinary habits, change in stools or urine, dysuria,hematuria,  rash, arthralgias, visual complaints, headache, numbness weakness or ataxia or problems with walking or coordination,  change in mood/affect or memory.        Review of Systems     Objective:   Physical Exam  amb [place] female nad with occ throat clearing   Wt Readings from Last 3 Encounters:  01/11/13 138 lb 12.8 oz (62.959 kg)  01/02/13 137 lb 3.2 oz (62.234 kg)  11/21/12 135 lb 8 oz (61.462 kg)      HEENT: nl dentition, turbinates, and orophanx. Nl external ear canals without cough reflex   NECK :  without JVD/Nodes/TM/ nl carotid upstrokes bilaterally   LUNGS: no acc muscle use, clear to A and P bilaterally without cough on insp or exp maneuvers   CV:  RRR  no s3 or murmur or increase in P2, no edema   ABD:  soft and nontender with nl excursion in the supine position. No bruits or organomegaly, bowel sounds nl  MS:  warm without deformities, calf tenderness, cyanosis or clubbing  SKIN: warm and dry without lesions    NEURO:  alert, approp, no deficits     CXR  01/11/2013 : Interval clearing of the left lower lobe process.  Persistent platelike atelectasis.      Assessment:

## 2013-01-11 NOTE — Patient Instructions (Addendum)
Stop cozar (losartan), stop calcium carbonate, and the fosfamax Benicar 20 mg one daily  Protonix 40 mg Take 30- 60 min before your first and last meals of the day  Neurontin (gabapentin) 100 mg take 2 with every meal Chlortrimeton 4 mg at bedtime  Take delsym two tsp every 12 hours and supplement if needed with  tramadol 50 mg up to 2 every 4 hours to suppress the urge to cough. Swallowing water or using ice chips/non mint and menthol containing candies (such as lifesavers or sugarless jolly ranchers) are also effective.  You should rest your voice and avoid activities that you know make you cough.  Once you have eliminated the cough for 3 straight days try reducing the tramadol first,  then the delsym as tolerated.    GERD (REFLUX)  is an extremely common cause of respiratory symptoms, many times with no significant heartburn at all.    It can be treated with medication, but also with lifestyle changes including avoidance of late meals, excessive alcohol, smoking cessation, and avoid fatty foods, chocolate, peppermint, colas, red wine, and acidic juices such as orange juice.  NO MINT OR MENTHOL PRODUCTS SO NO COUGH DROPS  USE SUGARLESS CANDY INSTEAD (jolley ranchers or Stover's)  NO OIL BASED VITAMINS - use powdered substitutes.   Please remember to go to the  x-ray department downstairs for your tests - we will call you with the results when they are available.    See Tammy NP w/in 2 weeks with all your medications, even over the counter meds, separated in two separate bags, the ones you take no matter what vs the ones you stop once you feel better and take only as needed when you feel you need them.   Tammy  will generate for you a new user friendly medication calendar that will put [place] all on the same page re: your medication use.     Without this process, it simply isn't possible to assure that we are providing  your outpatient care  with  the attention to detail we feel you deserve.   If  we cannot assure that you're getting that kind of care,  then we cannot manage your problem effectively from this clinic.  Once you have seen Tammy and we are sure that we're all on the same page with your medication use she will arrange follow up with me.

## 2013-01-11 NOTE — Assessment & Plan Note (Signed)
Cozar(for reasons unclear)  and norvasc (by relaxing LES) potentially could contribute to recurrent cough so rec simplify rx with benicar.

## 2013-01-17 ENCOUNTER — Telehealth: Payer: Self-pay | Admitting: Internal Medicine

## 2013-01-17 NOTE — Telephone Encounter (Signed)
Patient Instructions    Stop cozar (losartan), stop calcium carbonate, and the fosfamax  Benicar 20 mg one daily  Protonix 40 mg Take 30- 60 min before your first and last meals of the day  Neurontin (gabapentin) 100 mg take 2 with every meal  Chlortrimeton 4 mg at bedtime  Take delsym two tsp every 12 hours and supplement if needed with tramadol 50 mg up to 2 every 4 hours to suppress the urge to cough. Swallowing water or using ice chips/non mint and menthol containing candies (such as lifesavers or sugarless jolly ranchers) are also effective. You should rest your voice and avoid activities that you know make you cough.  Once you have eliminated the cough for 3 straight days try reducing the tramadol first, then the delsym as tolerated.   I called pt and discussed AVS with pt. She needed nothing further needed

## 2013-01-23 ENCOUNTER — Ambulatory Visit (INDEPENDENT_AMBULATORY_CARE_PROVIDER_SITE_OTHER): Payer: Medicare Other | Admitting: Endocrinology

## 2013-01-23 ENCOUNTER — Encounter: Payer: Self-pay | Admitting: Endocrinology

## 2013-01-23 VITALS — BP 144/96 | HR 97 | Temp 98.3°F | Resp 12 | Ht 58.5 in | Wt 136.6 lb

## 2013-01-23 DIAGNOSIS — E785 Hyperlipidemia, unspecified: Secondary | ICD-10-CM

## 2013-01-23 DIAGNOSIS — E1149 Type 2 diabetes mellitus with other diabetic neurological complication: Secondary | ICD-10-CM | POA: Diagnosis not present

## 2013-01-23 DIAGNOSIS — E1142 Type 2 diabetes mellitus with diabetic polyneuropathy: Secondary | ICD-10-CM

## 2013-01-23 DIAGNOSIS — I10 Essential (primary) hypertension: Secondary | ICD-10-CM

## 2013-01-23 LAB — COMPREHENSIVE METABOLIC PANEL
ALT: 16 U/L (ref 0–35)
AST: 19 U/L (ref 0–37)
Albumin: 3.9 g/dL (ref 3.5–5.2)
Alkaline Phosphatase: 44 U/L (ref 39–117)
Chloride: 100 mEq/L (ref 96–112)
Creatinine, Ser: 0.7 mg/dL (ref 0.4–1.2)
Glucose, Bld: 118 mg/dL — ABNORMAL HIGH (ref 70–99)
Potassium: 3.9 mEq/L (ref 3.5–5.1)
Total Bilirubin: 0.9 mg/dL (ref 0.3–1.2)

## 2013-01-23 LAB — FRUCTOSAMINE: Fructosamine: 302 umol/L — ABNORMAL HIGH (ref <285)

## 2013-01-23 NOTE — Progress Notes (Signed)
Patient ID: Mary Flynn, female   DOB: 10-01-1934, 78 y.o.   MRN: [id number]  Mary Flynn is an 78 y.o. female.   Reason for Appointment: Diabetes follow-up   History of Present Illness   Diagnosis: Type 2 DIABETES MELITUS, date of diagnosis:   1995    Previous history: She has been on various regimens of hypoglycemic drugs over the last several years including Actos, Amaryl and Januvia. Because of cost her Januvia was stopped and Actos was stopped because of potential weight gain She was initially started on Levemir insulin because of fasting hyperglycemia in 2009 and the dose has been adjusted periodically In the last year she is also quite mealtime coverage because of postprandial hyperglycemia despite usually eating a low carbohydrate diet The HbgA1c was reported as 7.4 in 1/14   Recent history: Blood glucose control is recently better but difficult to assess overall since her A1c is not available today She has not been checking her blood sugar after meals and again mostly in the morning.  She thinks she is more regular with taking her Levemir every night but still has occasional high reading in the morning. She now tells me that she is adjusting her Levemir dose based on how much she is eating at night regardless of blood sugar level She also is adjusting her NOVOLOG empirically based on her meal size but does not know what her readings are after meals  She has been fairly compliant with exercise although inconsistent with diet when out of town and may more carbohydrate     Oral hypoglycemic drugs:Amaryl, metformin  Side effects from medications: None Insulin regimen: Levemir 14-18 units based on diet;  Novolog 4-8 ac lunch usually         Proper timing of medications in relation to meals: Yes.          Monitors blood glucose: Once a day.    Glucometer:Accucheck        Blood Glucose readings from meter download:  PREMEAL Breakfast Lunch  evening     Glucose range:  79-169   99   not  checked     Median:  108        Hypoglycemia frequency:  very infrequent. She does not have any recent symptoms of hypoglycemia although she has   occasional low normal blood sugars. She calls any low blood sugars blackouts  Meals: 3 meals per day. she is a vegetarian. Has lunch around 2 PM      Physical activity: exercise: walking 6/7 days a week or going to the gym           Last visit with dietitian: 2009   Wt Readings from Last 3 Encounters:  01/23/13 136 lb 9.6 oz (61.961 kg)  01/11/13 138 lb 12.8 oz (62.959 kg)  01/02/13 137 lb 3.2 oz (62.234 kg)    LABS:  Lab Results  Component Value Date   HGBA1C 9.2* 11/21/2012   HGBA1C 7.7* 09/04/2011   HGBA1C 6.8* 06/08/2011   Lab Results  Component Value Date   MICROALBUR 3.5* 09/04/2011   LDLCALC 45 11/21/2012   CREATININE 0.6 11/21/2012        Medication List       This list is accurate as of: 01/23/13 11:06 AM.  Always use your most recent med list.               aspirin EC 81 MG tablet  Take 81 mg by mouth daily.  atorvastatin 10 MG tablet  Commonly known as:  LIPITOR  TAKE 1 TABLET BY MOUTH EVERY DAY     cyclobenzaprine 10 MG tablet  Commonly known as:  FLEXERIL  Take 1 tablet (10 mg total) by mouth 3 (three) times daily as needed for muscle spasms.     gabapentin 100 MG capsule  Commonly known as:  NEURONTIN  1 by mouth three times a day     glimepiride 4 MG tablet  Commonly known as:  AMARYL  1 po bid     insulin aspart 100 UNIT/ML injection  Commonly known as:  novoLOG  8-10 Units daily. Sliding scale     LEVEMIR FLEXPEN Taylor  Inject 18 Units into the skin at bedtime.     metFORMIN 1000 MG tablet  Commonly known as:  GLUCOPHAGE  TAKE 1 TABLET BY MOUTH TWICE DAILY WITH MEALS     multivitamin with minerals Tabs tablet  Take 1 tablet by mouth daily.     Niacin CR 1000 MG Tbcr  1 po bid     olmesartan 20 MG tablet  Commonly known as:  BENICAR  Take 1 tablet (20 mg total) by mouth daily.      pantoprazole 40 MG tablet  Commonly known as:  PROTONIX  Take 1 tablet (40 mg total) by mouth daily.     traMADol 50 MG tablet  Commonly known as:  ULTRAM  1-2 every 4 hours as needed for cough or pain     VITAMIN E PO  Take 1 tablet by mouth daily.     zolpidem 5 MG tablet  Commonly known as:  AMBIEN  Take 5 mg by mouth at bedtime as needed for sleep. 1 or 2  At bedtime if needed        Allergies:  Allergies  Allergen Reactions  . Penicillins   . Sulfonamide Derivatives     Past Medical History  Diagnosis Date  . Diabetes mellitus   . Hyperlipidemia   . Hypertension     Past Surgical History  Procedure Laterality Date  . Appendectomy    . Surgical sterilization  40 years ago  . Refractive surgery      Left Eye    Family History  Problem Relation Age of Onset  . Depression    . Cancer      laryngeal  . Diabetes      Social History:  reports that she has never smoked. She has never used smokeless tobacco. She reports that she does not drink alcohol or use illicit drugs.  Review of Systems:  Hypertension:   She had a significantly high blood pressure today, sometimes will have  whitecoat syndrome However because of her cough her losartan and amlodipine were stopped by pulmonologist Only on Benicar 20 mg. Has not checked blood pressure at home recently   Lipids: Has had diabetic dyslipidemia, usually well controlled, last LDL 37 and HDL 52  Neuropathy: She has had some sharp pains in her lower legs but no numbness. These  were controlled with gabapentin, now  taking increased dose on recommendation of pulmonologist and complaining of dry mouth  History of GERD She has been treated with Fosamax by PCP for osteopenia/osteoporosis, off now as recommended by pulmonologist       Examination:   BP 144/96  Pulse 97  Temp(Src) 98.3 F (36.8 C)  Resp 12  Ht 4' 10.5" (1.486 m)  Wt 136 lb 9.6 oz (61.961 kg)  BMI 28.06 kg/m2  SpO2 99%  Body mass index is  28.06 kg/(m^2).    ASSESSMENT/ PLAN::   Diabetes type 2   Blood glucose control is looking fairly good as judged by her home readings but she is checking readings primarily in the mornings   Her blood sugars were uncontrolled in 9/14 as judged by her A1c of over 9% and again not clear if she is having high readings after meals. Discussed need for postprandial monitoring to help adjust her NovoLog dose at her main meal and also assess for mealtime dosage requirement at other meals She is also adjusting her Levemir based on her diet rather than fasting blood sugar trend Recently her weight is stable and she is exercising very religiously whenever she is not traveling Hypoglycemia not present although she is not understanding that blood sugars in the 70s are normal and does not have any clear-cut symptoms at this time even with taking Amaryl   Now she is also not understanding how to adjust her LEVEMIR insulin and is doing this based on her food intake rather than fasting blood sugar trend   Plan:  she was given a titration chart for Levemir insulin based on fasting blood sugar trend over 3 days She was given a dose of 14 units consistently for the next 3 days For now will take only 6 units of NovoLog at lunch and 8 units if eating a larger meal Consider adding NovoLog at supper if eating a large meal or more carbohydrate She will continue Amaryl and metformin unchanged  Will check her metabolic panel and fructosamine today and  will discuss with the patient as needed    HYPERTENSION: Not well controlled and she will discuss treatment with her other physicians who are adjusting her medications  Counseling time over 50% of today's 25 minute visit   Mary Flynn 01/23/2013, 11:06 AM

## 2013-01-23 NOTE — Patient Instructions (Addendum)
Please check blood sugars at least half the time about 2 hours after any meal and as directed on waking up. Please bring blood sugar monitor to each visit  Novolog at lunch 6-8 units based on what you eat; sugar 2 hrs after eating sugar should be 120-160  Adjust Levemir every 3 days on the titration sheet as discussed today

## 2013-01-25 ENCOUNTER — Encounter: Payer: Self-pay | Admitting: Adult Health

## 2013-01-25 ENCOUNTER — Ambulatory Visit (INDEPENDENT_AMBULATORY_CARE_PROVIDER_SITE_OTHER): Payer: Medicare Other | Admitting: Adult Health

## 2013-01-25 VITALS — BP 126/78 | HR 100 | Temp 98.1°F | Wt 136.8 lb

## 2013-01-25 DIAGNOSIS — R05 Cough: Secondary | ICD-10-CM | POA: Diagnosis not present

## 2013-01-25 DIAGNOSIS — E1165 Type 2 diabetes mellitus with hyperglycemia: Secondary | ICD-10-CM

## 2013-01-25 MED ORDER — CHLORPHENIRAMINE MALEATE 4 MG PO TABS: 4.0000 mg | ORAL_TABLET | ORAL | Status: DC | PRN

## 2013-01-25 MED ORDER — DEXTROMETHORPHAN POLISTIREX 30 MG/5ML PO LQCR: 60.0000 mg | Freq: Two times a day (BID) | ORAL | Status: DC | PRN

## 2013-01-25 NOTE — Patient Instructions (Addendum)
Continue off cozar (losartan), stop calcium carbonate, and the fosamax  Continue on Benicar 20 mg one daily  Protonix 40 mg Take 30- 60 min before your first and last meals of the day  Neurontin (gabapentin) 100 mg take 2 with every meal Chlortrimeton 4 mg every 4hr as needed.   Take delsym two tsp every 12 hours and supplement if needed with  tramadol 50 mg up to 2 every 4 hours as needed to suppress the urge to cough. Swallowing water or using ice chips/non mint and menthol containing candies (such as lifesavers or sugarless jolly ranchers) are also effective.  You should rest your voice and avoid activities that you know make you cough.  GERD (REFLUX)  is an extremely common cause of respiratory symptoms, many times with no significant heartburn at all.    It can be treated with medication, but also with lifestyle changes including avoidance of late meals, excessive alcohol, smoking cessation, and avoid fatty foods, chocolate, peppermint, colas, red wine, and acidic juices such as orange juice.  NO MINT OR MENTHOL PRODUCTS SO NO COUGH DROPS  USE SUGARLESS CANDY INSTEAD (jolley ranchers or Stover's)  NO OIL BASED VITAMINS - use powdered substitutes.  Follow up Dr. Sherene Sires  In 4-6 weeks and As needed

## 2013-01-25 NOTE — Progress Notes (Signed)
Subjective:     Patient ID: Mary Flynn, female   DOB: Jul 05, 1934 MRN: [id number]  HPI   78 yo [place] female no childhood resp problems including pregancy then around 2004 began a pattern of recurrent cyclical pattern lasting from sev  months up to a year and then gone up to 4-5 months self referred to pulmonary clinic 01/11/2013 for re-eval of recurrent persistent cough   01/11/2013 1st [facility] office visit/ [place] in EMR era with recurrent cough Chief Complaint  Patient presents with  . Acute Visit    Pt c/o increased cough x 2 wks- prod with large amounts of clear sputum.  She states that she coughs until she gags and vomits.   cough tends to be worse with any thing in her mouth and with talking.  Actual mucus production is min and "nothing ever works" to include abx, prednisone, inhalers, gerd rx. eval at [facility] with conclusion "this isn't reflux" Already eval 01/02/13 by Dr Drue Novel rx with pack with sorethroat that has resolved  >>Stop cozar (losartan), stop calcium carbonate, and the fosfamax, Benicar 20 mg one daily  Protonix 40 mg Take 30- 60 min before your first and last meals of the day  Neurontin (gabapentin) 100 mg take 2 with every meal Chlortrimeton 4 mg at bedtime    01/25/2013 Follow up and med review  Patient returns for a two-week followup and medication review.  We reviewed all her medications organized them and updated MAR (med calendar database was down)  with patient education. Appears that she is taking her medications correctly. Patient reports that her cough is much improved. Patient denies any hemoptysis, chest pain, orthopnea, PND, or leg splint. Last visit. Patient was taken off olmesartan, calcium, and Fosamax. She was started on Benicar 20 mg daily. Treated for reflux, prevention regimen with Protonix twice daily. Her Neurontin was increased to 200 mg 3 times daily. As you start on Chlor-Trimeton 4 mg at bedtime. Chest x-ray last visit showed a clearing of  the left lower lobe pneumonia.   Current Medications, Allergies, Complete Past Medical History, Past Surgical History, Family History, and Social History were reviewed in [employer] record.  ROS  The following are not active complaints unless bolded sore throat, dysphagia, dental problems, itching, sneezing,  nasal congestion or excess/ purulent secretions, ear ache,   fever, chills, sweats, unintended wt loss, pleuritic or exertional cp, hemoptysis,  orthopnea pnd or leg swelling, presyncope, palpitations, heartburn, abdominal pain, anorexia, nausea, vomiting, diarrhea  or change in bowel or urinary habits, change in stools or urine, dysuria,hematuria,  rash, arthralgias, visual complaints, headache, numbness weakness or ataxia or problems with walking or coordination,  change in mood/affect or memory.           Objective:   Physical Exam  amb [place] female nad with occ throat clearing     HEENT: nl dentition, turbinates, and orophanx. Nl external ear canals without cough reflex   NECK :  without JVD/Nodes/TM/ nl carotid upstrokes bilaterally   LUNGS: no acc muscle use, clear to A and P bilaterally without cough on insp or exp maneuvers   CV:  RRR  no s3 or murmur or increase in P2, no edema   ABD:  soft and nontender with nl excursion in the supine position. No bruits or organomegaly, bowel sounds nl  MS:  warm without deformities, calf tenderness, cyanosis or clubbing  SKIN: warm and dry without lesions    NEURO:  alert, approp, no  deficits     CXR  01/11/2013 : Interval clearing of the left lower lobe process. Persistent platelike atelectasis.      Assessment:

## 2013-01-27 MED ORDER — GLIMEPIRIDE 4 MG PO TABS: 4.0000 mg | ORAL_TABLET | Freq: Two times a day (BID) | ORAL | Status: DC

## 2013-01-27 NOTE — Assessment & Plan Note (Signed)
Cyclical cough -improved on AR/GERD/Cough prevention reigmen ? Complicated by medication SE of losartan/fosamax  Plan  Continue off cozar (losartan), stop calcium carbonate, and the fosamax  Continue on Benicar 20 mg one daily  Protonix 40 mg Take 30- 60 min before your first and last meals of the day  Neurontin (gabapentin) 100 mg take 2 with every meal Chlortrimeton 4 mg every 4hr as needed.   Take delsym two tsp every 12 hours and supplement if needed with  tramadol 50 mg up to 2 every 4 hours as needed to suppress the urge to cough. Swallowing water or using ice chips/non mint and menthol containing candies (such as lifesavers or sugarless jolly ranchers) are also effective.  You should rest your voice and avoid activities that you know make you cough.  GERD (REFLUX)  is an extremely common cause of respiratory symptoms, many times with no significant heartburn at all.    It can be treated with medication, but also with lifestyle changes including avoidance of late meals, excessive alcohol, smoking cessation, and avoid fatty foods, chocolate, peppermint, colas, red wine, and acidic juices such as orange juice.  NO MINT OR MENTHOL PRODUCTS SO NO COUGH DROPS  USE SUGARLESS CANDY INSTEAD (jolley ranchers or Stover's)  NO OIL BASED VITAMINS - use powdered substitutes.  Follow up Dr. Sherene Sires  In 4-6 weeks and As needed

## 2013-01-31 ENCOUNTER — Other Ambulatory Visit: Payer: Self-pay | Admitting: Family Medicine

## 2013-02-07 DIAGNOSIS — H04129 Dry eye syndrome of unspecified lacrimal gland: Secondary | ICD-10-CM | POA: Diagnosis not present

## 2013-02-09 ENCOUNTER — Other Ambulatory Visit: Payer: Self-pay | Admitting: General Practice

## 2013-02-09 ENCOUNTER — Encounter: Payer: Self-pay | Admitting: Family Medicine

## 2013-02-09 ENCOUNTER — Ambulatory Visit (INDEPENDENT_AMBULATORY_CARE_PROVIDER_SITE_OTHER): Payer: Medicare Other | Admitting: Family Medicine

## 2013-02-09 VITALS — BP 130/80 | HR 80 | Temp 98.1°F | Resp 16 | Wt 135.4 lb

## 2013-02-09 DIAGNOSIS — J0100 Acute maxillary sinusitis, unspecified: Secondary | ICD-10-CM | POA: Insufficient documentation

## 2013-02-09 DIAGNOSIS — B356 Tinea cruris: Secondary | ICD-10-CM | POA: Diagnosis not present

## 2013-02-09 MED ORDER — NYSTATIN 100000 UNIT/GM EX OINT: 1.0000 "application " | TOPICAL_OINTMENT | Freq: Two times a day (BID) | CUTANEOUS | Status: DC

## 2013-02-09 MED ORDER — CLARITHROMYCIN 500 MG PO TABS: 500.0000 mg | ORAL_TABLET | Freq: Two times a day (BID) | ORAL | Status: DC

## 2013-02-09 NOTE — Progress Notes (Signed)
   Subjective:    Patient ID: Mary Flynn, female    DOB: 26-Nov-1934, 78 y.o.   MRN: [id number]  HPI Irritation in the panty line- pt reports hx of redness and burning.  Has hx of similar.  Used miconazole powder w/out relief.  Some itching.  Sinus pressure- sxs started 1 month ago.  + nasal congestion.  L ear pain, LAD.  No fevers.  + PND.   Review of Systems For ROS see HPI     Objective:   Physical Exam  Vitals reviewed. Constitutional: She appears well-developed and well-nourished. No distress.  HENT:  Head: Normocephalic and atraumatic.  Right Ear: Tympanic membrane normal.  Left Ear: Tympanic membrane normal.  Nose: Mucosal edema and rhinorrhea present. Right sinus exhibits maxillary sinus tenderness and frontal sinus tenderness. Left sinus exhibits maxillary sinus tenderness and frontal sinus tenderness.  Mouth/Throat: Uvula is midline and mucous membranes are normal. Posterior oropharyngeal erythema present. No oropharyngeal exudate.  Eyes: Conjunctivae and EOM are normal. Pupils are equal, round, and reactive to light.  Neck: Normal range of motion. Neck supple.  Cardiovascular: Normal rate, regular rhythm and normal heart sounds.   Pulmonary/Chest: Effort normal and breath sounds normal. No respiratory distress. She has no wheezes.  Lymphadenopathy:    She has no cervical adenopathy.  Skin: Skin is warm and dry. There is erythema (fungal dermatitis in groin creases bilaterally).          Assessment & Plan:

## 2013-02-09 NOTE — Patient Instructions (Signed)
Follow up as needed Start the Biaxin twice daily for the sinus infection- take w/ food Use the Nystatin ointment twice daily on the panty line Wear cotton underwear to let the area breathe- if at home, no panties and loose pants Call with any questions or concerns Hang in there! Happy Holidays!!

## 2013-02-14 NOTE — Assessment & Plan Note (Signed)
New.  Start abx.  Reviewed supportive care and red flags that should prompt return.  Pt expressed understanding and is in agreement w/ plan.  

## 2013-02-14 NOTE — Assessment & Plan Note (Signed)
New to provider, recurrent problem for pt.  Start Nystatin ointment twice daily.  Reviewed supportive care and red flags that should prompt return.  Pt expressed understanding and is in agreement w/ plan.

## 2013-02-22 ENCOUNTER — Ambulatory Visit (INDEPENDENT_AMBULATORY_CARE_PROVIDER_SITE_OTHER): Payer: Medicare Other | Admitting: Internal Medicine

## 2013-02-22 ENCOUNTER — Encounter: Payer: Self-pay | Admitting: Internal Medicine

## 2013-02-22 VITALS — BP 144/92 | HR 98 | Temp 98.0°F | Ht 59.0 in | Wt 137.0 lb

## 2013-02-22 DIAGNOSIS — R05 Cough: Secondary | ICD-10-CM | POA: Diagnosis not present

## 2013-02-22 DIAGNOSIS — I10 Essential (primary) hypertension: Secondary | ICD-10-CM | POA: Diagnosis not present

## 2013-02-22 MED ORDER — PANTOPRAZOLE SODIUM 40 MG PO TBEC: 40.0000 mg | DELAYED_RELEASE_TABLET | Freq: Every day | ORAL | Status: DC

## 2013-02-22 MED ORDER — PANTOPRAZOLE SODIUM 40 MG PO TBEC: DELAYED_RELEASE_TABLET | ORAL | Status: DC

## 2013-02-22 MED ORDER — OLMESARTAN MEDOXOMIL 40 MG PO TABS: 40.0000 mg | ORAL_TABLET | Freq: Every day | ORAL | Status: DC

## 2013-02-22 MED ORDER — GABAPENTIN 300 MG PO CAPS: 300.0000 mg | ORAL_CAPSULE | Freq: Three times a day (TID) | ORAL | Status: DC

## 2013-02-22 NOTE — Assessment & Plan Note (Signed)
-   increased neurontin to 200 tid 01/11/2013 > 300 mg tid as of 02/22/2013  -med review (MAR updated ) no calendar database down 01/25/13 > confirmed adherence  Marked improvement since started treating her for  Classic Upper airway cough syndrome, so named because it's frequently impossible to sort out how much is  CR/sinusitis with freq throat clearing (which can be related to primary GERD)   vs  causing  secondary (" extra esophageal")  GERD from wide swings in gastric pressure that occur with throat clearing, often  promoting self use of mint and menthol lozenges that reduce the lower esophageal sphincter tone and exacerbate the problem further in a cyclical fashion.   These are the same pts (now being labeled as having "irritable larynx syndrome" by some cough centers) who not infrequently have a history of having failed to tolerate ace inhibitors,  dry powder inhalers or biphosphonates or report having atypical reflux symptoms that don't respond to standard doses of PPI , and are easily confused as having aecopd or asthma flares by even experienced allergists/ pulmonologists.  For now increase the neurontin to max dose of 300 tid and continue gerd/ h1 rx     Each maintenance medication was reviewed in detail including most importantly the difference between maintenance and as needed and under what circumstances the prns are to be used.  Please see instructions for details which were reviewed in writing and the patient given a copy.

## 2013-02-22 NOTE — Patient Instructions (Addendum)
Increase neurontin to 300mg  three times a day  Increase benicar to 40 mg daily   See Tammy NP w/in 2 weeks with your drug formulary and all your medications, even over the counter meds, separated in two separate bags, the ones you take no matter what vs the ones you stop once you feel better and take only as needed when you feel you need them.   Tammy  will generate for you a new user friendly medication calendar that will put [place] all on the same page re: your medication use.     Without this process, it simply isn't possible to assure that we are providing  your outpatient care  with  the attention to detail we feel you deserve.   If we cannot assure that you're getting that kind of care,  then we cannot manage your problem effectively from this clinic.  Once you have seen Tammy and we are sure that we're all on the same page with your medication use she will arrange follow up with me.

## 2013-02-22 NOTE — Progress Notes (Signed)
Subjective:     Patient ID: Mary Flynn, female   DOB: [birth year] MRN: [id number]    Brief patient profile:  78 yo [place] female no childhood resp problems including pregancy then around 2004 began a pattern of recurrent cyclical pattern lasting from sev  months up to a year and then gone up to 4-5 months self referred to pulmonary clinic 01/11/2013 for re-eval of recurrent persistent cough   History of Present Illness  01/11/2013 1st [facility] office visit/ Lauralyn Shadowens in EMR era with recurrent cough Chief Complaint  Patient presents with  . Acute Visit    Pt c/o increased cough x 2 wks- prod with large amounts of clear sputum.  She states that she coughs until she gags and vomits.   cough tends to be worse with any thing in her mouth and with talking.  Actual mucus production is min and "nothing ever works" to include abx, prednisone, inhalers, gerd rx. eval at [facility] with conclusion "this isn't reflux" Already eval 01/02/13 by Dr Drue Novel rx with pack with sorethroat that has resolved  >>Stop cozar (losartan), stop calcium carbonate, and the fosfamax, Benicar 20 mg one daily  Protonix 40 mg Take 30- 60 min before your first and last meals of the day  Neurontin (gabapentin) 100 mg take 2 with every meal Chlortrimeton 4 mg at bedtime    01/25/2013 Follow up and med review  Patient returns for a two-week followup and medication review.  We reviewed all her medications organized them and updated MAR (med calendar database was down)  with patient education. Appears that she is taking her medications correctly. Patient reports that her cough is much improved. Patient denies any hemoptysis, chest pain, orthopnea, PND, or leg splint. Last visit. Patient was taken off olmesartan, calcium, and Fosamax. She was started on Benicar 20 mg daily. Treated for reflux, prevention regimen with Protonix twice daily. Her Neurontin was increased to 200 mg 3 times daily. As you start on Chlor-Trimeton 4 mg at  bedtime. Chest x-ray last visit showed a clearing of the left lower lobe pneumonia. rec Protonix 40 mg Take 30- 60 min before your first and last meals of the day  Neurontin (gabapentin) 100 mg take 2 with every meal Chlortrimeton 4 mg every 4hr as needed.  Take delsym two tsp every 12 hours and supplement if needed with  tramadol 50 mg up to 2 every 4 hours as needed to suppress the urge to cough.   GERD diet    02/22/2013 f/u ov/Jamarrion Budai re: chronic cough/ no med calendar Chief Complaint  Patient presents with  . Follow-up    Pt states her symptoms have improved greatly.  Still some nonprod cough and SOB with exertion, excessive talking.      No obvious day to day or daytime variabilty or assoc  cp or chest tightness, subjective wheeze overt sinus or hb symptoms. No unusual exp hx or h/o childhood pna/ asthma or knowledge of premature birth.  Sleeping ok without nocturnal  or early am exacerbation  of respiratory  c/o's or need for noct saba. Also denies any obvious fluctuation of symptoms with weather or environmental changes or other aggravating or alleviating factors except as outlined above   Current Medications, Allergies, Complete Past Medical History, Past Surgical History, Family History, and Social History were reviewed in [employer] record.  ROS  The following are not active complaints unless bolded sore throat, dysphagia, dental problems, itching, sneezing,  nasal congestion or excess/ purulent  secretions, ear ache,   fever, chills, sweats, unintended wt loss, pleuritic or exertional cp, hemoptysis,  orthopnea pnd or leg swelling, presyncope, palpitations, heartburn, abdominal pain, anorexia, nausea, vomiting, diarrhea  or change in bowel or urinary habits, change in stools or urine, dysuria,hematuria,  rash, arthralgias, visual complaints, headache, numbness weakness or ataxia or problems with walking or coordination,  change in mood/affect or memory.              Objective:   Physical Exam  amb [place] female nad with occ throat clearing     Wt Readings from Last 3 Encounters:  02/22/13 137 lb (62.143 kg)  02/09/13 135 lb 6 oz (61.406 kg)  01/25/13 136 lb 12.8 oz (62.052 kg)      HEENT: nl dentition, turbinates, and orophanx. Nl external ear canals without cough reflex   NECK :  without JVD/Nodes/TM/ nl carotid upstrokes bilaterally   LUNGS: no acc muscle use, clear to A and P bilaterally without cough on insp or exp maneuvers   CV:  RRR  no s3 or murmur or increase in P2, no edema   ABD:  soft and nontender with nl excursion in the supine position. No bruits or organomegaly, bowel sounds nl  MS:  warm without deformities, calf tenderness, cyanosis or clubbing        CXR  01/11/2013 : Interval clearing of the left lower lobe process. Persistent platelike atelectasis.      Assessment:

## 2013-02-22 NOTE — Assessment & Plan Note (Addendum)
Changed cozar and norvasc to benicar 01/11/2013 due to concern contributing to cough  Not optimal on benicar 20 and formulary may be more favorable to other arbs but would avoid cozar based on recent reports of acei like issues   For now samples of benicar 40 mg daily given and regroup in 2 weeks with formulary options.

## 2013-02-24 ENCOUNTER — Telehealth: Payer: Self-pay | Admitting: Family Medicine

## 2013-02-24 DIAGNOSIS — IMO0002 Reserved for concepts with insufficient information to code with codable children: Secondary | ICD-10-CM

## 2013-02-24 DIAGNOSIS — E1165 Type 2 diabetes mellitus with hyperglycemia: Secondary | ICD-10-CM

## 2013-02-24 DIAGNOSIS — R51 Headache: Secondary | ICD-10-CM

## 2013-02-24 DIAGNOSIS — E118 Type 2 diabetes mellitus with unspecified complications: Principal | ICD-10-CM

## 2013-02-24 MED ORDER — GLIMEPIRIDE 4 MG PO TABS: 4.0000 mg | ORAL_TABLET | Freq: Two times a day (BID) | ORAL | Status: DC

## 2013-02-24 MED ORDER — ATORVASTATIN CALCIUM 10 MG PO TABS: ORAL_TABLET | ORAL | Status: DC

## 2013-02-24 MED ORDER — CYCLOBENZAPRINE HCL 10 MG PO TABS: 10.0000 mg | ORAL_TABLET | Freq: Three times a day (TID) | ORAL | Status: DC | PRN

## 2013-02-24 MED ORDER — METFORMIN HCL 1000 MG PO TABS: ORAL_TABLET | ORAL | Status: DC

## 2013-02-24 NOTE — Telephone Encounter (Signed)
Patient is calling to request that new prescriptions are sent to [facility] on [address]. She states that since she has a new insurance she needs new prescriptions. She would like a 90-day supply on her 4 rx's below:  glimepiride (AMARYL) 4 MG tablet  cyclobenzaprine (FLEXERIL) 10 MG atorvastatin (LIPITOR) 10 MG tablet  metFORMIN (GLUCOPHAGE) 1000 MG

## 2013-02-27 ENCOUNTER — Telehealth: Payer: Self-pay | Admitting: Internal Medicine

## 2013-02-27 NOTE — Telephone Encounter (Signed)
Spoke with pt. She reports she has new insurance and some of her medications are too expensive. She reports she is needing our fax # so when she gets her formulary she will send this to [place] for review. Gave her the fax #. Nothing further needed

## 2013-02-28 ENCOUNTER — Telehealth: Payer: Self-pay | Admitting: Family Medicine

## 2013-02-28 NOTE — Telephone Encounter (Signed)
Patient is calling to speak with Mary Flynn in regards to her medications. States that some of them that were sent to her pharmacy are too expensive and she wants to discuss this. Please advise.

## 2013-03-01 NOTE — Telephone Encounter (Signed)
Gabapentin, Amaryl and Atorvastatin are too expensive and she wanted [place] to complete a form advising that she has to take these medications, I gave the patient the fax number to have the paperwork fax.    KP

## 2013-03-07 ENCOUNTER — Other Ambulatory Visit (INDEPENDENT_AMBULATORY_CARE_PROVIDER_SITE_OTHER): Payer: Medicare Other

## 2013-03-07 DIAGNOSIS — E1149 Type 2 diabetes mellitus with other diabetic neurological complication: Secondary | ICD-10-CM

## 2013-03-07 LAB — BASIC METABOLIC PANEL
BUN: 10 mg/dL (ref 6–23)
CALCIUM: 9.4 mg/dL (ref 8.4–10.5)
CO2: 29 mEq/L (ref 19–32)
CREATININE: 0.7 mg/dL (ref 0.4–1.2)
Chloride: 100 mEq/L (ref 96–112)
GFR: 94.09 mL/min (ref >60.00)
Glucose, Bld: 128 mg/dL — ABNORMAL HIGH (ref 70–99)
Potassium: 3.8 mEq/L (ref 3.5–5.1)
Sodium: 137 mEq/L (ref 135–145)

## 2013-03-07 LAB — HEMOGLOBIN A1C: Hgb A1c MFr Bld: 7.9 % — ABNORMAL HIGH (ref 4.6–6.5)

## 2013-03-08 ENCOUNTER — Ambulatory Visit: Payer: Medicare Other | Admitting: Endocrinology

## 2013-03-10 ENCOUNTER — Encounter: Payer: Self-pay | Admitting: Adult Health

## 2013-03-10 ENCOUNTER — Ambulatory Visit (INDEPENDENT_AMBULATORY_CARE_PROVIDER_SITE_OTHER): Payer: Medicare Other | Admitting: Adult Health

## 2013-03-10 VITALS — BP 116/72 | HR 100 | Temp 97.2°F | Ht 59.0 in | Wt 138.2 lb

## 2013-03-10 DIAGNOSIS — R05 Cough: Secondary | ICD-10-CM | POA: Diagnosis not present

## 2013-03-10 DIAGNOSIS — R059 Cough, unspecified: Secondary | ICD-10-CM

## 2013-03-10 MED ORDER — TRAMADOL HCL 50 MG PO TABS: ORAL_TABLET | ORAL | Status: DC

## 2013-03-10 NOTE — Progress Notes (Signed)
Subjective:     Patient ID: Mary Flynn, female   DOB: 20-Jul-1934 MRN: [id number]    Brief patient profile:  79 yo [place] female no childhood resp problems including pregancy then around 2004 began a pattern of recurrent cyclical pattern lasting from sev  months up to a year and then gone up to 4-5 months self referred to pulmonary clinic 01/11/2013 for re-eval of recurrent persistent cough   History of Present Illness  01/11/2013 1st [facility] office visit/ [facility]ert in EMR era with recurrent cough Chief Complaint  Patient presents with  . Acute Visit    Pt c/o increased cough x 2 wks- prod with large amounts of clear sputum.  She states that she coughs until she gags and vomits.   cough tends to be worse with any thing in her mouth and with talking.  Actual mucus production is min and "nothing ever works" to include abx, prednisone, inhalers, gerd rx. eval at [facility] with conclusion "this isn't reflux" Already eval 01/02/13 by Dr Larose Kells rx with pack with sorethroat that has resolved  >>Stop cozar (losartan), stop calcium carbonate, and the fosfamax, Benicar 20 mg one daily  Protonix 40 mg Take 30- 60 min before your first and last meals of the day  Neurontin (gabapentin) 100 mg take 2 with every meal Chlortrimeton 4 mg at bedtime    01/25/2013 Follow up and med review  Patient returns for a two-week followup and medication review.  We reviewed all her medications organized them and updated MAR (med calendar database was down)  with patient education. Appears that she is taking her medications correctly. Patient reports that her cough is much improved. Patient denies any hemoptysis, chest pain, orthopnea, PND, or leg splint. Last visit. Patient was taken off olmesartan, calcium, and Fosamax. She was started on Benicar 20 mg daily. Treated for reflux, prevention regimen with Protonix twice daily. Her Neurontin was increased to 200 mg 3 times daily. As you start on Chlor-Trimeton 4 mg at  bedtime. Chest x-ray last visit showed a clearing of the left lower lobe pneumonia. rec Protonix 40 mg Take 30- 60 min before your first and last meals of the day  Neurontin (gabapentin) 100 mg take 2 with every meal Chlortrimeton 4 mg every 4hr as needed.  Take delsym two tsp every 12 hours and supplement if needed with  tramadol 50 mg up to 2 every 4 hours as needed to suppress the urge to cough.   GERD diet    02/22/2013 f/u ov/Wert re: chronic cough/ no med calendar Chief Complaint  Patient presents with  . Follow-up    Pt states her symptoms have improved greatly.  Still some nonprod cough and SOB with exertion, excessive talking.     >>neurontin 300 Three times a day    03/10/13 Follow up and  Med review  est med calendar - pt brought all meds with her today.  reports cough is improved except for when she talks/sings or eats.  white mucus production in the mornings. Is starting to feel better but cough is not totally gone.  Does have sinus drainage often  No fever, chest pain or orthopnea.  We reviewed her meds and organized them into a med calendar with pt education .    Current Medications, Allergies, Complete Past Medical History, Past Surgical History, Family History, and Social History were reviewed in [employer] record.  ROS  The following are not active complaints unless bolded sore throat, dysphagia, dental  problems, itching, sneezing,  nasal congestion or excess/ purulent secretions, ear ache,   fever, chills, sweats, unintended wt loss, pleuritic or exertional cp, hemoptysis,  orthopnea pnd or leg swelling, presyncope, palpitations, heartburn, abdominal pain, anorexia, nausea, vomiting, diarrhea  or change in bowel or urinary habits, change in stools or urine, dysuria,hematuria,  rash, arthralgias, visual complaints, headache, numbness weakness or ataxia or problems with walking or coordination,  change in mood/affect or memory.              Objective:   Physical Exam  amb [place] female nad with occ throat clearing     HEENT: nl dentition, turbinates, and orophanx. Nl external ear canals without cough reflex   NECK :  without JVD/Nodes/TM/ nl carotid upstrokes bilaterally   LUNGS: no acc muscle use, clear to A and P bilaterally without cough on insp or exp maneuvers   CV:  RRR  no s3 or murmur or increase in P2, no edema   ABD:  soft and nontender with nl excursion in the supine position. No bruits or organomegaly, bowel sounds nl  MS:  warm without deformities, calf tenderness, cyanosis or clubbing        CXR  01/11/2013 : Interval clearing of the left lower lobe process. Persistent platelike atelectasis.      Assessment:

## 2013-03-10 NOTE — Patient Instructions (Signed)
Continue on Benicar 40 mg one daily  Change to Omeprazole 20mg  daily  From Protonix (due to Insurance ) Continue on Gabapentin 300mg  Three times a day  .   Use Chlortrimeton 4mg  every 4hr As needed  Drippy nose/drainage.  Take delsym two tsp every 12 hours and supplement if needed with  tramadol 50 mg up to 2 every 4 hours as needed to suppress the urge to cough. Swallowing water or using ice chips/non mint and menthol containing candies (such as lifesavers or sugarless jolly ranchers) are also effective.  You should rest your voice and avoid activities that you know make you cough.  GOAL IS TO NOT COUGH OR CLEAR THROAT.  If not better then will need to do CT scan of sinus .  follow up in 2 weeks and As needed   Follow med calendar closely and bring to each visit.

## 2013-03-13 ENCOUNTER — Encounter: Payer: Self-pay | Admitting: Endocrinology

## 2013-03-13 ENCOUNTER — Ambulatory Visit: Payer: Medicare Other | Admitting: Endocrinology

## 2013-03-13 ENCOUNTER — Ambulatory Visit (INDEPENDENT_AMBULATORY_CARE_PROVIDER_SITE_OTHER): Payer: Medicare Other | Admitting: Endocrinology

## 2013-03-13 VITALS — BP 128/80 | HR 103 | Temp 97.6°F | Resp 12 | Wt 137.0 lb

## 2013-03-13 DIAGNOSIS — E1165 Type 2 diabetes mellitus with hyperglycemia: Principal | ICD-10-CM

## 2013-03-13 DIAGNOSIS — R413 Other amnesia: Secondary | ICD-10-CM

## 2013-03-13 DIAGNOSIS — IMO0001 Reserved for inherently not codable concepts without codable children: Secondary | ICD-10-CM

## 2013-03-13 DIAGNOSIS — I10 Essential (primary) hypertension: Secondary | ICD-10-CM

## 2013-03-13 NOTE — Patient Instructions (Addendum)
Stay on 14 Levemir unless am sugar stays < 90 or >140  Take 3-4 Novolog before dinner if eating more than a small amount of Carbohydrate Have significant amount of protein with each meal especially supper such as low-fat dairy products or lentils  More sugar testing after lunch  Take NOVOLOG 5-10 min before eating

## 2013-03-13 NOTE — Progress Notes (Signed)
Patient ID: Mary Flynn, female   DOB: 1934/05/02, 79 y.o.   MRN: [id number]   Reason for Appointment: Diabetes follow-up   History of Present Illness   Diagnosis: Type 2 DIABETES MELITUS, date of diagnosis:   1995    Previous history: She has been on various regimens of hypoglycemic drugs over the last several years including Actos, Amaryl and Januvia. Because of cost her Januvia was stopped and Actos was stopped because of potential weight gain She was initially started on Levemir insulin because of fasting hyperglycemia in 2009 and the dose has been adjusted periodically In the last year she is also quite mealtime coverage because of postprandial hyperglycemia despite usually eating a low carbohydrate diet The HbgA1c was reported as 7.4 in 1/14   Recent history: Blood glucose control is overall fairly good but blood sugars are significantly variable especially after meals She has been checking her blood sugar only occasionally after meals and mostly in the morning.  On her last visit she was instructed on how to adjust her Levemir every 3 days based on fasting reading but she is probably doing this on a daily basis However is taking relatively lower dose of Levemir and the last 2 weeks fasting readings are excellent She will have sporadic high readings of over 200 after meals including lunch even though she is eating relatively small portions Does not always get enough protein with her meals especially supper Again complaining of difficulty losing weight Asking about getting lower cost drugs including generics Hypoglycemia :  this is infrequent. Lowest glucose is 57 about 3 weeks ago fasting and also had a 60 glucose around supper time last month However she thinks sometimes she will feel very tired but her blood sugars only about 90; she feels better with eating something     Oral hypoglycemic drugs:Amaryl, metformin  Side effects from medications: None Insulin regimen: Levemir 14 units ;   Novolog 8 ac lunch usually, none at suppertime usually         Proper timing of medications in relation to meals: She is taking her NovoLog right after eating       Monitors blood glucose: Once a day.    Glucometer:Accucheck        Blood Glucose readings from meter download:  PREMEAL Breakfast  Afternoon   evening     Glucose range:  79-169   124-204   79-228     Average:  121   175   175      Meals: 3 meals per day. she is a vegetarian. Trying to get some meds during proteins including dairy products and tofu  Has lunch around 2 PM      Physical activity: exercise: walking 6/7 days a week or going to the gym           Last visit with dietitian: 2009   Wt Readings from Last 3 Encounters:  03/13/13 137 lb (62.143 kg)  03/10/13 138 lb 3.2 oz (62.687 kg)  02/22/13 137 lb (62.143 kg)    LABS:  Lab Results  Component Value Date   HGBA1C 7.9* 03/07/2013   HGBA1C 9.2* 11/21/2012   HGBA1C 7.7* 09/04/2011   Lab Results  Component Value Date   MICROALBUR 3.5* 09/04/2011   LDLCALC 45 11/21/2012   CREATININE 0.7 03/07/2013       Medication List       This list is accurate as of: 03/13/13  1:48 PM.  Always use your most recent med  list.               aspirin EC 81 MG tablet  Take 81 mg by mouth daily.     atorvastatin 10 MG tablet  Commonly known as:  LIPITOR  TAKE 1 TABLET BY MOUTH EVERY DAY     chlorpheniramine 4 MG tablet  Commonly known as:  CHLOR-TRIMETON  Take 1 tablet (4 mg total) by mouth every 4 (four) hours as needed for allergies.     cyclobenzaprine 10 MG tablet  Commonly known as:  FLEXERIL  Take 1 tablet (10 mg total) by mouth 3 (three) times daily as needed for muscle spasms.     gabapentin 300 MG capsule  Commonly known as:  NEURONTIN  Take 1 capsule (300 mg total) by mouth 3 (three) times daily.     glimepiride 4 MG tablet  Commonly known as:  AMARYL  Take 1 tablet (4 mg total) by mouth 2 (two) times daily.     insulin aspart 100 UNIT/ML injection   Commonly known as:  novoLOG  8-10 Units daily. Sliding scale     LEVEMIR FLEXPEN [place]  Inject 18 Units into the skin at bedtime.     metFORMIN 1000 MG tablet  Commonly known as:  GLUCOPHAGE  TAKE 1 TABLET BY MOUTH TWICE DAILY WITH MEALS     multivitamin with minerals Tabs tablet  Take 1 tablet by mouth daily.     Niacin CR 1000 MG Tbcr  1 po bid     nystatin ointment  Commonly known as:  MYCOSTATIN  Apply 1 application topically 2 (two) times daily.     olmesartan 40 MG tablet  Commonly known as:  BENICAR  Take 1 tablet (40 mg total) by mouth daily.     pantoprazole 40 MG tablet  Commonly known as:  PROTONIX  Take 30- 60 min before your first and last meals of the day     traMADol 50 MG tablet  Commonly known as:  ULTRAM  1-2 every 4 hours as needed for cough or pain     VITAMIN E PO  Take 1 tablet by mouth daily.        Allergies:  Allergies  Allergen Reactions  . Penicillins Hives  . Sulfa Antibiotics Hives  . Sulfonamide Derivatives     Past Medical History  Diagnosis Date  . Diabetes mellitus   . Hyperlipidemia   . Hypertension     Past Surgical History  Procedure Laterality Date  . Appendectomy    . Surgical sterilization  40 years ago  . Refractive surgery      Left Eye    Family History  Problem Relation Age of Onset  . Depression    . Cancer      laryngeal  . Diabetes      Social History:  reports that she has never smoked. She has never used smokeless tobacco. She reports that she does not drink alcohol or use illicit drugs.  Review of Systems:  She is asking about forgetfulness and decreased memory, she has not discussed this with her PCP  Hypertension:   She had a good blood pressure today, sometimes will have  whitecoat syndrome Only on Benicar 20 mg. Has not checked blood pressure at home recently   Lipids: Has had diabetic dyslipidemia, usually well controlled  Lab Results  Component Value Date   CHOL 112 11/21/2012   HDL  53.00 11/21/2012   LDLCALC 45 11/21/2012   TRIG 70.0  11/21/2012   CHOLHDL 2 11/21/2012    Neuropathy: She has had some sharp pains in her lower legs but no numbness. These  were controlled with gabapentin, now  taking increased dose on recommendation of pulmonologist  She has been treated with Fosamax by PCP for osteopenia/osteoporosis, off now as recommended by pulmonologist       Examination:   BP 128/80  Pulse 103  Temp(Src) 97.6 F (36.4 C) (Oral)  Resp 12  Wt 137 lb (62.143 kg)  SpO2 98%  Body mass index is 27.66 kg/(m^2).    ASSESSMENT/ PLAN::   Diabetes type 2   Blood glucose control is looking fairly good as judged by her home readings especially in the morning Her A1c is relatively higher than ideal probably because of postprandial hyperglycemia She is doing fairly well with about 14 units of Levemir and advised her to continue the same dose unless blood sugars are significantly out of the desired range of 90-130 fasting She probably needs to take 3-4 units NovoLog  at suppertime if not eating much protein Because of her relatively high carbohydrate intake she needs to take her NovoLog BEFORE eating other than after She needs to check more readings after meals to help adjust mealtime coverage She can try getting her oral generic diabetic medications from the local grocery store pharmacy   Hypertension: Well controlled  Mild obesity: She has difficulty losing weight and this may be difficult while taking insulin, encouraged her to continue exercise regimen and moderate on carbohydrates  Memory loss: She needs to followup with PCP about this  Counseling time over 50% of today's 25 minute visit  Maygen Sirico 03/13/2013, 1:48 PM

## 2013-03-17 NOTE — Assessment & Plan Note (Signed)
Cyclical cough  Cont with trigger prevention  Patient's medications were reviewed today and patient education was given. Computerized medication calendar was adjusted/completed    Plan  Continue on Benicar 40 mg one daily  Change to Omeprazole 20mg  daily  From Protonix (due to Insurance ) Continue on Gabapentin 300mg  Three times a day  .   Use Chlortrimeton 4mg  every 4hr As needed  Drippy nose/drainage.  Take delsym two tsp every 12 hours and supplement if needed with  tramadol 50 mg up to 2 every 4 hours as needed to suppress the urge to cough. Swallowing water or using ice chips/non mint and menthol containing candies (such as lifesavers or sugarless jolly ranchers) are also effective.  You should rest your voice and avoid activities that you know make you cough.  GOAL IS TO NOT COUGH OR CLEAR THROAT.  If not better then will need to do CT scan of sinus .  follow up in 2 weeks and As needed   Follow med calendar closely and bring to each visit.

## 2013-03-20 ENCOUNTER — Encounter: Payer: Self-pay | Admitting: Family Medicine

## 2013-03-20 ENCOUNTER — Telehealth: Payer: Self-pay | Admitting: Family Medicine

## 2013-03-20 ENCOUNTER — Ambulatory Visit (INDEPENDENT_AMBULATORY_CARE_PROVIDER_SITE_OTHER): Payer: Medicare Other | Admitting: Family Medicine

## 2013-03-20 VITALS — BP 142/74 | HR 108 | Temp 98.7°F | Wt 138.0 lb

## 2013-03-20 DIAGNOSIS — E1165 Type 2 diabetes mellitus with hyperglycemia: Secondary | ICD-10-CM

## 2013-03-20 DIAGNOSIS — R413 Other amnesia: Secondary | ICD-10-CM | POA: Diagnosis not present

## 2013-03-20 DIAGNOSIS — E785 Hyperlipidemia, unspecified: Secondary | ICD-10-CM

## 2013-03-20 DIAGNOSIS — I10 Essential (primary) hypertension: Secondary | ICD-10-CM

## 2013-03-20 DIAGNOSIS — E118 Type 2 diabetes mellitus with unspecified complications: Secondary | ICD-10-CM

## 2013-03-20 DIAGNOSIS — M62838 Other muscle spasm: Secondary | ICD-10-CM | POA: Diagnosis not present

## 2013-03-20 DIAGNOSIS — IMO0002 Reserved for concepts with insufficient information to code with codable children: Secondary | ICD-10-CM

## 2013-03-20 DIAGNOSIS — J019 Acute sinusitis, unspecified: Secondary | ICD-10-CM

## 2013-03-20 DIAGNOSIS — E1149 Type 2 diabetes mellitus with other diabetic neurological complication: Secondary | ICD-10-CM

## 2013-03-20 DIAGNOSIS — Z1239 Encounter for other screening for malignant neoplasm of breast: Secondary | ICD-10-CM

## 2013-03-20 DIAGNOSIS — E2839 Other primary ovarian failure: Secondary | ICD-10-CM

## 2013-03-20 DIAGNOSIS — J329 Chronic sinusitis, unspecified: Secondary | ICD-10-CM

## 2013-03-20 MED ORDER — FLUTICASONE PROPIONATE 50 MCG/ACT NA SUSP: 2.0000 | Freq: Every day | NASAL | Status: DC

## 2013-03-20 MED ORDER — LEVOFLOXACIN 500 MG PO TABS: 500.0000 mg | ORAL_TABLET | Freq: Every day | ORAL | Status: DC

## 2013-03-20 MED ORDER — BACLOFEN 20 MG PO TABS: 20.0000 mg | ORAL_TABLET | Freq: Three times a day (TID) | ORAL | Status: DC

## 2013-03-20 MED ORDER — METFORMIN HCL 1000 MG PO TABS: ORAL_TABLET | ORAL | Status: DC

## 2013-03-20 MED ORDER — NIACIN 500 MG PO TABS: 1000.0000 mg | ORAL_TABLET | Freq: Every day | ORAL | Status: DC

## 2013-03-20 MED ORDER — GLIMEPIRIDE 4 MG PO TABS: 4.0000 mg | ORAL_TABLET | Freq: Two times a day (BID) | ORAL | Status: DC

## 2013-03-20 NOTE — Progress Notes (Signed)
Pre visit review using our clinic review tool, if applicable. No additional management support is needed unless otherwise documented below in the visit note. 

## 2013-03-20 NOTE — Progress Notes (Signed)
   Subjective:    Patient ID: Mary Flynn, female    DOB: 10-Jun-1934, 79 y.o.   MRN: [id number]  HPI Pt here for med refill and c/o memory loss that is worsening.  Pt is forgetting names and is requesting neuro referral. Pt    Review of Systems    as above Objective:   Physical Exam  BP 142/74  Pulse 108  Temp(Src) 98.7 F (37.1 C) (Oral)  Wt 138 lb (62.596 kg)  SpO2 97% General appearance: alert, cooperative, appears stated age and no distress Throat: lips, mucosa, and tongue normal; teeth and gums normal Lungs: clear to auscultation bilaterally Heart: S1, S2 normal Extremities: extremities normal, atraumatic, no cyanosis or edema Neurologic: Alert and oriented X 3, normal strength and tone. Normal symmetric reflexes. Normal coordination and gait                    MMSE 24/30     Assessment & Plan:

## 2013-03-20 NOTE — Assessment & Plan Note (Signed)
con't meds  Check labs 

## 2013-03-20 NOTE — Telephone Encounter (Signed)
ok 

## 2013-03-20 NOTE — Telephone Encounter (Signed)
Fine with me

## 2013-03-20 NOTE — Patient Instructions (Signed)
Dementia Dementia is a general term for problems with brain function. A person with dementia has memory loss and a hard time with at least one other brain function such as thinking, speaking, or problem solving. Dementia can affect social functioning, how you do your job, your mood, or your personality. The changes may be hidden for a long time. The earliest forms of this disease are usually not detected by family or friends. Dementia can be:  Irreversible.  Potentially reversible.  Partially reversible.  Progressive. This means it can get worse over time. CAUSES  Irreversible dementia causes may include:  Degeneration of brain cells (Alzheimer's disease or lewy body dementia).  Multiple small strokes (vascular dementia).  Infection (chronic meningitis or Creutzfelt-Jakob disease).  Frontotemporal dementia. This affects younger people, age 25 to 55, compared to those who have Alzheimer's disease.  Dementia associated with other disorders like Parkinson's disease, Huntington's disease, or HIV-associated dementia. Potentially or partially reversible dementia causes may include:  Medicines.  Metabolic causes such as excessive alcohol intake, vitamin B12 deficiency, or thyroid disease.  Masses or pressure in the brain such as a tumor, blood clot, or hydrocephalus. SYMPTOMS  Symptoms are often hard to detect. Family members or coworkers may not notice them early in the disease process. Different people with dementia may have different symptoms. Symptoms can include:  A hard time with memory, especially recent memory. Long-term memory may not be impaired.  Asking the same question multiple times or forgetting something someone just said.  A hard time speaking your thoughts or finding certain words.  A hard time solving problems or performing familiar tasks (such as how to use a telephone).  Sudden changes in mood.  Changes in personality, especially increasing moodiness or  mistrust.  Depression.  A hard time understanding complex ideas that were never a problem in the past. DIAGNOSIS  There are no specific tests for dementia.   Your caregiver may recommend a thorough evaluation. This is because some forms of dementia can be reversible. The evaluation will likely include a physical exam and getting a detailed history from you and a family member. The history often gives the best clues and suggestions for a diagnosis.  Memory testing may be done. A detailed brain function evaluation called neuropsychologic testing may be helpful.  Lab tests and brain imaging (such as a CT scan or MRI scan) are sometimes important.  Sometimes observation and re-evaluation over time is very helpful. TREATMENT  Treatment depends on the cause.   If the problem is a vitamin deficiency, it may be helped or cured with supplements.  For dementias such as Alzheimer's disease, medicines are available to stabilize or slow the course of the disease. There are no cures for this type of dementia.  Your caregiver can help direct you to groups, organizations, and other caregivers to help with decisions in the care of you or your loved one. HOME CARE INSTRUCTIONS The care of individuals with dementia is varied and dependent upon the progression of the dementia. The following suggestions are intended for the person living with, or caring for, the person with dementia.  Create a safe environment.  Remove the locks on bathroom doors to prevent the person from accidentally locking himself or herself in.  Use childproof latches on kitchen cabinets and any place where cleaning supplies, chemicals, or alcohol are kept.  Use childproof covers in unused electrical outlets.  Install childproof devices to keep doors and windows secured.  Remove stove knobs or install safety  knobs and an automatic shut-off on the stove.  Lower the temperature on water heaters.  Label medicines and keep them  locked up.  Secure knives, lighters, matches, power tools, and guns, and keep these items out of reach.  Keep the house free from clutter. Remove rugs or anything that might contribute to a fall.  Remove objects that might break and hurt the person.  Make sure lighting is good, both inside and outside.  Install grab rails as needed.  Use a monitoring device to alert you to falls or other needs for help.  Reduce confusion.  Keep familiar objects and people around.  Use night lights or dim lights at night.  Label items or areas.  Use reminders, notes, or directions for daily activities or tasks.  Keep a simple, consistent routine for waking, meals, bathing, dressing, and bedtime.  Create a calm, quiet environment.  Place large clocks and calendars prominently.  Display emergency numbers and home address near all telephones.  Use cues to establish different times of the day. An example is to open curtains to let the natural light in during the day.   Use effective communication.  Choose simple words and short sentences.  Use a gentle, calm tone of voice.  Be careful not to interrupt.  If the person is struggling to find a word or communicate a thought, try to provide the word or thought.  Ask one question at a time. Allow the person ample time to answer questions. Repeat the question again if the person does not respond.  Reduce nighttime restlessness.  Provide a comfortable bed.  Have a consistent nighttime routine.  Ensure a regular walking or physical activity schedule. Involve the person in daily activities as much as possible.  Limit napping during the day.  Limit caffeine.  Attend social events that stimulate rather than overwhelm the senses.  Encourage good nutrition and hydration.  Reduce distractions during meal times and snacks.  Avoid foods that are too hot or too cold.  Monitor chewing and swallowing ability.  Continue with routine vision,  hearing, dental, and medical screenings.  Only give over-the-counter or prescription medicines as directed by the caregiver.  Monitor driving abilities. Do not allow the person to drive when safe driving is no longer possible.  Register with an identification program which could provide location assistance in the event of a missing person situation. SEEK MEDICAL CARE IF:   New behavioral problems start such as moodiness, aggressiveness, or seeing things that are not there (hallucinations).  Any new problem with brain function happens. This includes problems with balance, speech, or falling a lot.  Problems with swallowing develop.  Any symptoms of other illness happen. Small changes or worsening in any aspect of brain function can be a sign that the illness is getting worse. It can also be a sign of another medical illness such as infection. Seeing a caregiver right away is important. SEEK IMMEDIATE MEDICAL CARE IF:   A fever develops.  New or worsened confusion develops.  New or worsened sleepiness develops.  Staying awake becomes hard to do. Document Released: 08/05/2000 Document Revised: 05/04/2011 Document Reviewed: 07/07/2010 [facility] Patient Information 2014 [place], [place]. Diabetes and Standards of Medical Care  Diabetes is complicated. You may find that your diabetes team includes a dietitian, nurse, diabetes educator, eye doctor, and more. To help everyone know what is going on and to help you get the care you deserve, the following schedule of care was developed to help keep  you on track. Below are the tests, exams, vaccines, medicines, education, and plans you will need. HbA1c test This test shows how well you have controlled your glucose over the past 2 3 months. It is used to see if your diabetes management plan needs to be adjusted.   It is performed at least 2 times a year if you are meeting treatment goals.  It is performed 4 times a year if therapy has changed or  if you are not meeting treatment goals. Blood pressure test  This test is performed at every routine medical visit. The goal is less than 140/90 mmHg for most people, but 130/80 mmHg in some cases. Ask your health care provider about your goal. Dental exam  Follow up with the dentist regularly. Eye exam  If you are diagnosed with type 1 diabetes as a child, get an exam upon reaching the age of 84 years or older and have had diabetes for 3 5 years. Yearly eye exams are recommended after that initial eye exam.  If you are diagnosed with type 1 diabetes as an adult, get an exam within 5 years of diagnosis and then yearly.  If you are diagnosed with type 2 diabetes, get an exam as soon as possible after the diagnosis and then yearly. Foot care exam  Visual foot exams are performed at every routine medical visit. The exams check for cuts, injuries, or other problems with the feet.  A comprehensive foot exam should be done yearly. This includes visual inspection as well as assessing foot pulses and testing for loss of sensation.  Check your feet nightly for cuts, injuries, or other problems with your feet. Tell your health care provider if anything is not healing. Kidney function test (urine microalbumin)  This test is performed once a year.  Type 1 diabetes: The first test is performed 5 years after diagnosis.  Type 2 diabetes: The first test is performed at the time of diagnosis.  A serum creatinine and estimated glomerular filtration rate (eGFR) test is done once a year to assess the level of chronic kidney disease (CKD), if present. Lipid profile (cholesterol, HDL, LDL, triglycerides)  Performed every 5 years for most people.  The goal for LDL is less than 100 mg/dL. If you are at high risk, the goal is less than 70 mg/dL.  The goal for HDL is 40 mg/dL 50 mg/dL for men and 50 mg/dL 60 mg/dL for women. An HDL cholesterol of 60 mg/dL or higher gives some protection against heart  disease.  The goal for triglycerides is less than 150 mg/dL. Influenza vaccine, pneumococcal vaccine, and hepatitis B vaccine  The influenza vaccine is recommended yearly.  The pneumococcal vaccine is generally given once in a lifetime. However, there are some instances when another vaccination is recommended. Check with your health care provider.  The hepatitis B vaccine is also recommended for adults with diabetes. Diabetes self-management education  Education is recommended at diagnosis and ongoing as needed. Treatment plan  Your treatment plan is reviewed at every medical visit. Document Released: 12/07/2008 Document Revised: 10/12/2012 Document Reviewed: 07/12/2012 [facility] Patient Information 2014 [place].

## 2013-03-20 NOTE — Assessment & Plan Note (Signed)
Per endo °

## 2013-03-20 NOTE — Telephone Encounter (Signed)
Patient states she would like to switch from Dr. Etter Sjogren to Dr. Birdie Riddle. Please advise.

## 2013-03-20 NOTE — Assessment & Plan Note (Signed)
Refer to neuro Check labs   

## 2013-03-20 NOTE — Assessment & Plan Note (Signed)
Pt finished biaxin and states symptoms are no better Ct being arranged by another provider per pt See meds and orders

## 2013-03-20 NOTE — Assessment & Plan Note (Signed)
con't meds 

## 2013-03-23 ENCOUNTER — Telehealth: Payer: Self-pay

## 2013-03-23 ENCOUNTER — Other Ambulatory Visit (INDEPENDENT_AMBULATORY_CARE_PROVIDER_SITE_OTHER): Payer: Medicare Other

## 2013-03-23 DIAGNOSIS — N390 Urinary tract infection, site not specified: Secondary | ICD-10-CM

## 2013-03-23 DIAGNOSIS — E785 Hyperlipidemia, unspecified: Secondary | ICD-10-CM

## 2013-03-23 DIAGNOSIS — R413 Other amnesia: Secondary | ICD-10-CM | POA: Diagnosis not present

## 2013-03-23 LAB — HEPATIC FUNCTION PANEL
ALBUMIN: 4.1 g/dL (ref 3.5–5.2)
ALT: 16 U/L (ref 0–35)
AST: 20 U/L (ref 0–37)
Alkaline Phosphatase: 60 U/L (ref 39–117)
Bilirubin, Direct: 0.2 mg/dL (ref 0.0–0.3)
Total Bilirubin: 1.4 mg/dL — ABNORMAL HIGH (ref 0.3–1.2)
Total Protein: 7.1 g/dL (ref 6.0–8.3)

## 2013-03-23 LAB — MICROALBUMIN / CREATININE URINE RATIO
CREATININE, U: 22.6 mg/dL
MICROALB UR: 1.9 mg/dL (ref 0.0–1.9)
Microalb Creat Ratio: 8.4 mg/g (ref 0.0–30.0)

## 2013-03-23 LAB — LIPID PANEL
CHOLESTEROL: 120 mg/dL (ref 0–200)
HDL: 66.7 mg/dL (ref >39.00)
LDL CALC: 43 mg/dL (ref 0–99)
Total CHOL/HDL Ratio: 2
Triglycerides: 52 mg/dL (ref 0.0–149.0)
VLDL: 10.4 mg/dL (ref 0.0–40.0)

## 2013-03-23 LAB — POCT URINALYSIS DIPSTICK
Bilirubin, UA: NEGATIVE
Blood, UA: NEGATIVE
GLUCOSE UA: NEGATIVE
Ketones, UA: NEGATIVE
Nitrite, UA: NEGATIVE
Protein, UA: NEGATIVE
SPEC GRAV UA: 1.01
Urobilinogen, UA: 0.2
pH, UA: 8

## 2013-03-23 LAB — TSH: TSH: 3.99 u[IU]/mL (ref 0.35–5.50)

## 2013-03-23 NOTE — Telephone Encounter (Signed)
Relevant patient education assigned to patient using Emmi. ° °

## 2013-03-24 ENCOUNTER — Ambulatory Visit (INDEPENDENT_AMBULATORY_CARE_PROVIDER_SITE_OTHER): Payer: Medicare Other | Admitting: Adult Health

## 2013-03-24 ENCOUNTER — Encounter: Payer: Self-pay | Admitting: Adult Health

## 2013-03-24 VITALS — BP 128/80 | HR 95 | Ht 60.0 in | Wt 138.0 lb

## 2013-03-24 DIAGNOSIS — R05 Cough: Secondary | ICD-10-CM | POA: Diagnosis not present

## 2013-03-24 DIAGNOSIS — R059 Cough, unspecified: Secondary | ICD-10-CM

## 2013-03-24 LAB — URINE CULTURE
Colony Count: NO GROWTH
Organism ID, Bacteria: NO GROWTH

## 2013-03-24 LAB — RPR

## 2013-03-24 NOTE — Assessment & Plan Note (Signed)
Cyclical cough , not improve despite maximal aggressive trigger prevent her and cough suppression regimen. We'll check CT sinus and chest to rule out other etiology.  Plan Continue on Benicar 40 mg one daily  Continue on  Omeprazole 20mg  daily   Continue on Gabapentin 300mg  Three times a day  .   Use Chlortrimeton 4mg  every 4hr As needed  Drippy nose/drainage.  Take delsym two tsp every 12 hours and supplement if needed with  tramadol 50 mg up to 2 every 4 hours as needed to suppress the urge to cough. Swallowing water or using ice chips/non mint and menthol containing candies (such as lifesavers or sugarless jolly ranchers) are also effective.  You should rest your voice and avoid activities that you know make you cough.  GOAL IS TO NOT COUGH OR CLEAR THROAT.  We are setting you up for a CT scan of sinus  And lungs.  Follow up in 4  Weeks and As needed   Follow med calendar closely

## 2013-03-24 NOTE — Progress Notes (Signed)
Subjective:     Patient ID: Mary Flynn, female   DOB: [birth year] MRN: [id number]    Brief patient profile:  79 yo [place] female no childhood resp problems including pregancy then around 2004 began a pattern of recurrent cyclical pattern lasting from sev  months up to a year and then gone up to 4-5 months self referred to pulmonary clinic 01/11/2013 for re-eval of recurrent persistent cough   History of Present Illness  01/11/2013 1st [facility] office visit/ Wert in EMR era with recurrent cough Chief Complaint  Patient presents with  . Acute Visit    Pt c/o increased cough x 2 wks- prod with large amounts of clear sputum.  She states that she coughs until she gags and vomits.   cough tends to be worse with any thing in her mouth and with talking.  Actual mucus production is min and "nothing ever works" to include abx, prednisone, inhalers, gerd rx. eval at [facility] with conclusion "this isn't reflux" Already eval 01/02/13 by Dr Larose Kells rx with pack with sorethroat that has resolved  >>Stop cozar (losartan), stop calcium carbonate, and the fosfamax, Benicar 20 mg one daily  Protonix 40 mg Take 30- 60 min before your first and last meals of the day  Neurontin (gabapentin) 100 mg take 2 with every meal Chlortrimeton 4 mg at bedtime    01/25/2013 Follow up and med review  Patient returns for a two-week followup and medication review.  We reviewed all her medications organized them and updated MAR (med calendar database was down)  with patient education. Appears that she is taking her medications correctly. Patient reports that her cough is much improved. Patient denies any hemoptysis, chest pain, orthopnea, PND, or leg splint. Last visit. Patient was taken off olmesartan, calcium, and Fosamax. She was started on Benicar 20 mg daily. Treated for reflux, prevention regimen with Protonix twice daily. Her Neurontin was increased to 200 mg 3 times daily. As you start on Chlor-Trimeton 4 mg at  bedtime. Chest x-ray last visit showed a clearing of the left lower lobe pneumonia. rec Protonix 40 mg Take 30- 60 min before your first and last meals of the day  Neurontin (gabapentin) 100 mg take 2 with every meal Chlortrimeton 4 mg every 4hr as needed.  Take delsym two tsp every 12 hours and supplement if needed with  tramadol 50 mg up to 2 every 4 hours as needed to suppress the urge to cough.   GERD diet    02/22/2013 f/u ov/[facility] re: chronic cough/ no med calendar Chief Complaint  Patient presents with  . Follow-up    Pt states her symptoms have improved greatly.  Still some nonprod cough and SOB with exertion, excessive talking.     >>neurontin 300 Three times a day    03/10/13 Follow up and  Med review  est med calendar - pt brought all meds with her today.  reports cough is improved except for when she talks/sings or eats.  white mucus production in the mornings. Is starting to feel better but cough is not totally gone.  Does have sinus drainage often  No fever, chest pain or orthopnea.  We reviewed her meds and organized them into a med calendar with pt education .  >>aggressive cough control   03/24/2013 Follow up  Patient returns for a two-week followup. Last visit. She was treated with aggressive cough suppression regimen, along with trigger control with reflux, rhinitis prevention. Patient reports that she did have significant  improvement in her cough. However, she has nail, developed some nasal congestion, sinus pressure and cough. Has returned despite using gabapentin 3 times daily. Delsym and tramadol for cough control. She denies any hemoptysis, orthopnea, PND, or leg swelling. Last chest x-ray in November showed platelike atelectasis in the left and right lung base.   Current Medications, Allergies, Complete Past Medical History, Past Surgical History, Family History, and Social History were reviewed in [employer] record.  ROS  The following  are not active complaints unless bolded sore throat, dysphagia, dental problems, itching, sneezing,   ear ache,   fever, chills, sweats, unintended wt loss, pleuritic or exertional cp, hemoptysis,  orthopnea pnd or leg swelling, presyncope, palpitations, heartburn, abdominal pain, anorexia, nausea, vomiting, diarrhea  or change in bowel or urinary habits, change in stools or urine, dysuria,hematuria,  rash, arthralgias, visual complaints, headache, numbness weakness or ataxia or problems with walking or coordination,  change in mood/affect or memory.             Objective:   Physical Exam  amb [place] female nad with occ throat clearing     HEENT: nl dentition, turbinates, and orophanx. Nl external ear canals without cough reflex   NECK :  without JVD/Nodes/TM/ nl carotid upstrokes bilaterally   LUNGS: no acc muscle use, clear to A and P bilaterally without cough on insp or exp maneuvers   CV:  RRR  no s3 or murmur or increase in P2, no edema   ABD:  soft and nontender with nl excursion in the supine position. No bruits or organomegaly, bowel sounds nl  MS:  warm without deformities, calf tenderness, cyanosis or clubbing        CXR  01/11/2013 : Interval clearing of the left lower lobe process. Persistent platelike atelectasis.      Assessment:

## 2013-03-24 NOTE — Patient Instructions (Addendum)
Continue on Benicar 40 mg one daily  Continue on  Omeprazole 20mg  daily   Continue on Gabapentin 300mg  Three times a day  .   Use Chlortrimeton 4mg  every 4hr As needed  Drippy nose/drainage.  Take delsym two tsp every 12 hours and supplement if needed with  tramadol 50 mg up to 2 every 4 hours as needed to suppress the urge to cough. Swallowing water or using ice chips/non mint and menthol containing candies (such as lifesavers or sugarless jolly ranchers) are also effective.  You should rest your voice and avoid activities that you know make you cough.  GOAL IS TO NOT COUGH OR CLEAR THROAT.  We are setting you up for a CT scan of sinus  And lungs.  Follow up in 4  Weeks and As needed   Follow med calendar closely

## 2013-03-28 ENCOUNTER — Encounter: Payer: Self-pay | Admitting: Diagnostic Neuroimaging

## 2013-03-28 ENCOUNTER — Telehealth: Payer: Self-pay | Admitting: Internal Medicine

## 2013-03-28 ENCOUNTER — Ambulatory Visit (INDEPENDENT_AMBULATORY_CARE_PROVIDER_SITE_OTHER): Payer: Medicare Other | Admitting: Diagnostic Neuroimaging

## 2013-03-28 VITALS — BP 167/84 | HR 94 | Temp 97.8°F | Ht 59.0 in | Wt 137.0 lb

## 2013-03-28 DIAGNOSIS — G3184 Mild cognitive impairment, so stated: Secondary | ICD-10-CM

## 2013-03-28 MED ORDER — AZITHROMYCIN 250 MG PO TABS: ORAL_TABLET | ORAL | Status: DC

## 2013-03-28 NOTE — Telephone Encounter (Signed)
Pt saw TP 1.30.15 for follow up: Patient Instructions     Continue on Benicar 40 mg one daily  Continue on Omeprazole 20mg  daily  Continue on Gabapentin 300mg  Three times a day .  Use Chlortrimeton 4mg  every 4hr As needed Drippy nose/drainage.  Take delsym two tsp every 12 hours and supplement if needed with tramadol 50 mg up to 2 every 4 hours as needed to suppress the urge to cough. Swallowing water or using ice chips/non mint and menthol containing candies (such as lifesavers or sugarless jolly ranchers) are also effective. You should rest your voice and avoid activities that you know make you cough.  GOAL IS TO NOT COUGH OR CLEAR THROAT.  We are setting you up for a CT scan of sinus And lungs.  Follow up in 4 Weeks and As needed  Follow med calendar closely    Allergies  Allergen Reactions  . Penicillins Hives  . Sulfa Antibiotics Hives  . Sulfonamide Derivatives

## 2013-03-28 NOTE — Telephone Encounter (Signed)
Spoke with pt. Reports coughing with production of green mucus x3 days. Saw TP 2 days ago. Wants something called in.  TP - please advise. Thanks.

## 2013-03-28 NOTE — Telephone Encounter (Signed)
Called spoke with patient, advised of TP's recs as stated below Pt verbalized her understanding, and denied any further questions/concerns Nothing further needed at this time; will sign off

## 2013-03-28 NOTE — Telephone Encounter (Signed)
They are calling back again

## 2013-03-28 NOTE — Telephone Encounter (Signed)
zpack #1 take as directed.  Cont w/ ov recs  Please contact office for sooner follow up if symptoms do not improve or worsen or seek emergency care

## 2013-03-28 NOTE — Progress Notes (Signed)
[facility]  PATIENT: Mary Flynn DOB: 06-Jul-1934  REFERRING CLINICIAN: Lowne HISTORY FROM: patient  REASON FOR VISIT: new consult   HISTORICAL  CHIEF COMPLAINT:  Chief Complaint  Patient presents with  . Memory Loss    HISTORY OF PRESENT ILLNESS:   79 year old right-handed female with history of diabetes, fibromyalgia, here for evaluation of memory loss.  2 years ago, patient's 74 year old son passed away unexpectedly. Patient had significant grieving, depression and psychological trauma from this. Patient has gradually come to terms with this although she still struggles.  Over the past 3 months patient has noted increasing short-term memory problems, particularly with her memory people's names. Patient's husband has noted some difficulty although he thinks this is not a major problem. He has noted that over their 8 years of marriage, she has had tendency for attention and focus difficulties. Overall patient is the one most concerned with her memory/cognitive deficits.  Patient still has excellent long-term memory and recall. She is able to maintain all of her activities of daily living.  REVIEW OF SYSTEMS: Full 14 system review of systems performed and notable only for trouble swallowing cough constipation joint pain aching muscles runny nose allergy memory loss.  ALLERGIES: Allergies  Allergen Reactions  . Penicillins Hives  . Sulfa Antibiotics Hives  . Sulfonamide Derivatives     HOME MEDICATIONS: Outpatient Prescriptions Prior to Visit  Medication Sig Dispense Refill  . aspirin EC 81 MG tablet Take 81 mg by mouth daily.      Marland Kitchen atorvastatin (LIPITOR) 10 MG tablet TAKE 1 TABLET BY MOUTH EVERY DAY  90 tablet  1  . b complex vitamins tablet Take 1 tablet by mouth daily.      . chlorpheniramine (CHLOR-TRIMETON) 4 MG tablet Take 1 tablet (4 mg total) by mouth every 4 (four) hours as needed for allergies.  14 tablet  0  . dextromethorphan (DELSYM) 30  MG/5ML liquid 2 tsp twice daily as needed for cough      . gabapentin (NEURONTIN) 300 MG capsule Take 1 capsule (300 mg total) by mouth 3 (three) times daily.  270 capsule  1  . glimepiride (AMARYL) 4 MG tablet Take 1 tablet (4 mg total) by mouth 2 (two) times daily.  180 tablet  1  . insulin aspart (NOVOLOG) 100 UNIT/ML injection Inject 6-8 Units into the skin daily.       . Insulin Detemir (LEVEMIR FLEXPEN [place]) Inject 14 Units into the skin at bedtime.       . metFORMIN (GLUCOPHAGE) 1000 MG tablet TAKE 1 TABLET BY MOUTH TWICE DAILY WITH MEALS  180 tablet  1  . Niacin CR 1000 MG TBCR 1 po bid  60 each  5  . olmesartan (BENICAR) 40 MG tablet Take 1 tablet (40 mg total) by mouth daily.      . baclofen (LIORESAL) 20 MG tablet Take 1 tablet (20 mg total) by mouth 3 (three) times daily.  30 each  0  . fluticasone (FLONASE) 50 MCG/ACT nasal spray Place 2 sprays into both nostrils daily.  16 g  6  . levofloxacin (LEVAQUIN) 500 MG tablet Take 1 tablet (500 mg total) by mouth daily.  10 tablet  0  . nystatin ointment (MYCOSTATIN) Apply 1 application topically 2 (two) times daily as needed.      Marland Kitchen omeprazole (PRILOSEC) 20 MG capsule Take 20 mg by mouth daily before breakfast.      . traMADol (ULTRAM) 50 MG tablet 1-2 every 4  hours as needed for cough or pain  40 tablet  0  . niacin 500 MG tablet Take 2 tablets (1,000 mg total) by mouth at bedtime.  180 tablet  3   No facility-administered medications prior to visit.    PAST MEDICAL HISTORY: Past Medical History  Diagnosis Date  . Diabetes mellitus   . Hyperlipidemia   . Hypertension     PAST SURGICAL HISTORY: Past Surgical History  Procedure Laterality Date  . Appendectomy    . Surgical sterilization  40 years ago  . Refractive surgery      Left Eye    FAMILY HISTORY: Family History  Problem Relation Age of Onset  . Depression    . Cancer      laryngeal  . Diabetes    . Diabetes Mother     SOCIAL HISTORY:  History   Social  History  . Marital Status: Married    Spouse Name: Virgil Slinger    Number of Children: 2  . Years of Education: College   Occupational History  . housewife    Social History Main Topics  . Smoking status: Never Smoker   . Smokeless tobacco: Never Used  . Alcohol Use: No  . Drug Use: No  . Sexual Activity: Not Currently   Other Topics Concern  . Not on file   Social History Narrative   Patient lives at home with spouse.   Caffeine Use: rarely     PHYSICAL EXAM  Filed Vitals:   03/28/13 1025  BP: 167/84  Pulse: 94  Temp: 97.8 F (36.6 C)  TempSrc: Oral  Height: 4\' 11"  (1.499 m)  Weight: 137 lb (62.143 kg)    Not recorded    Body mass index is 27.66 kg/(m^2).  GENERAL EXAM: Patient is in no distress; well developed, nourished and groomed; neck is supple  CARDIOVASCULAR: Regular rate and rhythm, no murmurs, no carotid bruits  NEUROLOGIC: MENTAL STATUS: awake, alert, oriented to person, place and time, recent and remote memory intact, normal attention and concentration, language fluent, comprehension intact, naming intact, fund of knowledge appropriate; MMSE 29/30 (MISSES 1 FOR INSTRUCTIONS). MOCA 22/30. NO FRONTAL RELEASE SIGNS. CRANIAL NERVE: no papilledema on fundoscopic exam, pupils equal and reactive to light, visual fields full to confrontation, extraocular muscles intact, no nystagmus, facial sensation and strength symmetric, hearing intact, palate elevates symmetrically, uvula midline, shoulder shrug symmetric, tongue midline. MOTOR: normal bulk and tone, full strength in the BUE, BLE SENSORY: normal and symmetric to light touch, pinprick, temperature; DECR VIB AT TOES. COORDINATION: finger-nose-finger, fine finger movements normal REFLEXES: deep tendon reflexes present and symmetric; TRACE AT ANKLES. GAIT/STATION: narrow based gait; DIFF WITH TANDEM, TOE AND HEEL GAIT. Romberg is negative    DIAGNOSTIC DATA (LABS, IMAGING, TESTING) - I reviewed patient  records, labs, notes, testing and imaging myself where available.  Lab Results  Component Value Date   WBC 7.0 11/21/2012   HGB 12.5 11/21/2012   HCT 37.8 11/21/2012   MCV 82.7 11/21/2012   PLT 275.0 11/21/2012      Component Value Date/Time   NA 137 03/07/2013 0814   K 3.8 03/07/2013 0814   CL 100 03/07/2013 0814   CO2 29 03/07/2013 0814   GLUCOSE 128* 03/07/2013 0814   GLUCOSE 114* 01/19/2006 0832   BUN 10 03/07/2013 0814   CREATININE 0.7 03/07/2013 0814   CALCIUM 9.4 03/07/2013 0814   PROT 7.1 03/23/2013 0815   ALBUMIN 4.1 03/23/2013 0815   AST 20 03/23/2013 0815  ALT 16 03/23/2013 0815   ALKPHOS 60 03/23/2013 0815   BILITOT 1.4* 03/23/2013 0815   GFRNONAA >90 06/18/2011 0650   GFRAA >90 06/18/2011 0650   Lab Results  Component Value Date   CHOL 120 03/23/2013   HDL 66.70 03/23/2013   LDLCALC 43 03/23/2013   TRIG 52.0 03/23/2013   CHOLHDL 2 03/23/2013   Lab Results  Component Value Date   HGBA1C 7.9* 03/07/2013   Lab Results  Component Value Date   VITAMINB12 429 12/23/2009   Lab Results  Component Value Date   TSH 3.99 03/23/2013    I reviewed images myself and agree with interpretation. -VRP  12/16/04 MRI BRAIN 1. No acute abnormality. Chronic white matter ischemic change in the parietal white matter bilaterally.  2. Incidental venous angioma in the right occipital lobe.    ASSESSMENT AND PLAN  79 y.o. year old female here with mild short-term memory problems, focus and attention difficulty over the past 3-6 months. Neurologic examination unremarkable except for MMSE 29/30 and MOCA 22/30. No frontal release signs. This likely represents mild cognitive impairment versus more long-standing attention deficit disorder. Anxiety, bereavement, depression and chronic pain may be contributing factors.   PLAN: - reassured patient; will observe over next 6 months with repeat testing - no medications recommended at this time - reviewed health habits and physical activities to help  preserve cognitive abilities   Return in about 6 months (around 09/25/2013).    Penni Bombard, MD 04/24/9516, 84:16 AM Certified in Neurology, Neurophysiology and Neuroimaging  [facility] [address] [place], [place] [address] [phone]

## 2013-03-29 ENCOUNTER — Telehealth: Payer: Self-pay | Admitting: Family Medicine

## 2013-03-29 ENCOUNTER — Other Ambulatory Visit: Payer: Medicare Other

## 2013-03-29 ENCOUNTER — Inpatient Hospital Stay: Admission: RE | Admit: 2013-03-29 | Payer: Medicare Other | Source: Ambulatory Visit

## 2013-03-29 NOTE — Telephone Encounter (Signed)
Relevant patient education assigned to patient using Emmi. ° °

## 2013-03-31 ENCOUNTER — Ambulatory Visit (INDEPENDENT_AMBULATORY_CARE_PROVIDER_SITE_OTHER)
Admission: RE | Admit: 2013-03-31 | Discharge: 2013-03-31 | Disposition: A | Discharge status: Home / Self Care | Payer: Medicare Other | Source: Ambulatory Visit | Attending: Family Medicine | Admitting: Family Medicine

## 2013-03-31 DIAGNOSIS — E2839 Other primary ovarian failure: Secondary | ICD-10-CM | POA: Diagnosis not present

## 2013-04-03 ENCOUNTER — Telehealth: Payer: Self-pay | Admitting: Internal Medicine

## 2013-04-03 NOTE — Telephone Encounter (Signed)
Cont w/ ov recs  Wait until after ct sinus/chest then will decide on further  rx  Please contact office for sooner follow up if symptoms do not improve or worsen or seek emergency care

## 2013-04-03 NOTE — Telephone Encounter (Signed)
Spoke with pt. Still reports coughing with production of clear mucus and a runny nose. Has finished taking Zpack from last week. Wants to know how to proceed.  TP - please advise. Thanks.

## 2013-04-03 NOTE — Telephone Encounter (Signed)
Called back and advised pt of TP's rec's. Pt verbalized understanding and has no further questions, will have CT done

## 2013-04-05 ENCOUNTER — Ambulatory Visit (INDEPENDENT_AMBULATORY_CARE_PROVIDER_SITE_OTHER)
Admission: RE | Admit: 2013-04-05 | Discharge: 2013-04-05 | Disposition: A | Discharge status: Home / Self Care | Payer: Medicare Other | Source: Ambulatory Visit | Attending: Adult Health | Admitting: Adult Health

## 2013-04-05 DIAGNOSIS — R05 Cough: Secondary | ICD-10-CM

## 2013-04-05 DIAGNOSIS — J323 Chronic sphenoidal sinusitis: Secondary | ICD-10-CM | POA: Diagnosis not present

## 2013-04-05 DIAGNOSIS — R059 Cough, unspecified: Secondary | ICD-10-CM

## 2013-04-05 DIAGNOSIS — J438 Other emphysema: Secondary | ICD-10-CM | POA: Diagnosis not present

## 2013-04-05 MED ORDER — IOHEXOL 300 MG/ML  SOLN
80.0000 mL | Freq: Once | INTRAMUSCULAR | Status: AC | PRN
  Administered 2013-04-05: 80 mL via INTRAVENOUS

## 2013-04-06 ENCOUNTER — Encounter: Payer: Self-pay | Admitting: Adult Health

## 2013-04-07 ENCOUNTER — Other Ambulatory Visit: Payer: Self-pay | Admitting: Family Medicine

## 2013-04-11 ENCOUNTER — Ambulatory Visit: Payer: Medicare Other | Admitting: Internal Medicine

## 2013-04-13 ENCOUNTER — Telehealth: Payer: Self-pay | Admitting: General Practice

## 2013-04-13 NOTE — Telephone Encounter (Signed)
Received a Prior Authorization form for cyclobenzaprine on 04/01/13. Per last OV note this medication was D/C due to a formulary change. Please advise what that new medication was and if I can disregard the PA.

## 2013-04-13 NOTE — Telephone Encounter (Signed)
Talk to pt-- explain situation I believe they will pay for zanaflex 4 mg # 30  1po q8h prn

## 2013-04-14 ENCOUNTER — Encounter: Payer: Self-pay | Admitting: Internal Medicine

## 2013-04-14 ENCOUNTER — Ambulatory Visit (INDEPENDENT_AMBULATORY_CARE_PROVIDER_SITE_OTHER): Payer: Medicare Other | Admitting: Internal Medicine

## 2013-04-14 VITALS — BP 164/88 | HR 103 | Temp 97.6°F | Ht 59.0 in | Wt 136.0 lb

## 2013-04-14 DIAGNOSIS — R05 Cough: Secondary | ICD-10-CM

## 2013-04-14 DIAGNOSIS — R059 Cough, unspecified: Secondary | ICD-10-CM

## 2013-04-14 MED ORDER — OMEPRAZOLE 40 MG PO CPDR: 40.0000 mg | DELAYED_RELEASE_CAPSULE | Freq: Every day | ORAL | Status: DC

## 2013-04-14 MED ORDER — FAMOTIDINE 20 MG PO TABS: ORAL_TABLET | ORAL | Status: DC

## 2013-04-14 NOTE — Telephone Encounter (Addendum)
Msg left to call the office, she said she has discussed this with Dr.Lowne and she was given something else and it is working ok.     KP

## 2013-04-14 NOTE — Progress Notes (Signed)
Subjective:     Patient ID: Mary Flynn, female   DOB: [birth year] MRN: [id number]    Brief patient profile:  79 yo [place] female no childhood resp problems including pregancy then around 2004 began a pattern of recurrent cyclical pattern lasting from sev  months up to a year and then gone up to 4-5 months self referred to pulmonary clinic 01/11/2013 for re-eval of recurrent persistent cough - Workup including allergy testing,barium swallow, 24hour pH probe nonrevealing previously     History of Present Illness  01/11/2013 1st [facility] office visit/ Mary Flynn in EMR era with recurrent cough Chief Complaint  Patient presents with  . Acute Visit    Pt c/o increased cough x 2 wks- prod with large amounts of clear sputum.  She states that she coughs until she gags and vomits.   cough tends to be worse with any thing in her mouth and with talking.  Actual mucus production is min and "nothing ever works" to include abx, prednisone, inhalers, gerd rx. eval at [facility] with conclusion "this isn't reflux" Already eval 01/02/13 by Dr Larose Kells rx with pack with sorethroat that has resolved  >>Stop cozar (losartan), stop calcium carbonate, and the fosfamax, Benicar 20 mg one daily  Protonix 40 mg Take 30- 60 min before your first and last meals of the day  Neurontin (gabapentin) 100 mg take 2 with every meal Chlortrimeton 4 mg at bedtime    01/25/2013 Follow up and med review  Patient returns for a two-week followup and medication review.  We reviewed all her medications organized them and updated MAR (med calendar database was down)  with patient education. Appears that she is taking her medications correctly. Patient reports that her cough is much improved. Patient denies any hemoptysis, chest pain, orthopnea, PND, or leg splint. Last visit. Patient was taken off olmesartan, calcium, and Fosamax. She was started on Benicar 20 mg daily. Treated for reflux, prevention regimen with Protonix twice daily. Her  Neurontin was increased to 200 mg 3 times daily. As you start on Chlor-Trimeton 4 mg at bedtime. Chest x-ray last visit showed a clearing of the left lower lobe pneumonia. rec Protonix 40 mg Take 30- 60 min before your first and last meals of the day  Neurontin (gabapentin) 100 mg take 2 with every meal Chlortrimeton 4 mg every 4hr as needed.  Take delsym two tsp every 12 hours and supplement if needed with  tramadol 50 mg up to 2 every 4 hours as needed to suppress the urge to cough.   GERD diet    02/22/2013 f/u ov/Mary Flynn re: chronic cough/ no med calendar Chief Complaint  Patient presents with  . Follow-up    Pt states her symptoms have improved greatly.  Still some nonprod cough and SOB with exertion, excessive talking.     >>neurontin 300 Three times a day    03/10/13 Follow up and  Med review  est med calendar - pt brought all meds with her today.  reports cough is improved except for when she talks/sings or eats.  white mucus production in the mornings. Is starting to feel better but cough is not totally gone.  Does have sinus drainage often  No fever, chest pain or orthopnea.  We reviewed her meds and organized them into a med calendar with pt education .  >>aggressive cough control   03/24/2013 Follow up  Patient returns for a two-week followup. Last visit. She was treated with aggressive cough suppression regimen, along with  trigger control with reflux, rhinitis prevention. Patient reports that she did have significant improvement in her cough. However, she has now developed some nasal congestion, sinus pressure and cough. Has returned despite using gabapentin 3 times daily. Delsym and tramadol for cough control. She denies any hemoptysis, orthopnea, PND, or leg swelling. Last chest x-ray in November showed platelike atelectasis in the left and right lung base rec Continue on Benicar 40 mg one daily  Continue on  Omeprazole 20mg  daily   Continue on Gabapentin 300mg  Three times  a day  .   Use Chlortrimeton 4mg  every 4hr As needed  Drippy nose/drainage.  Take delsym two tsp every 12 hours and supplement if needed with  tramadol 50 mg up to 2 every 4 hours  04/14/2013 acute  ov/Mary Flynn [id number] x 10 y cough worse since the last week in Jan 2014 / abn CT chst  Chief Complaint  Patient presents with  . Follow-up    Increased cough since last visit. She c/o HA, sore throat and ear pain x 2 days. Cough is prod with clear sputum.    Never purulent sputum, cough worse day than night. No sob. Not using ppi ac and using home remedies  No obvious day to day or daytime variabilty or assoc   cp or chest tightness, subjective wheeze overt sinus or hb symptoms. No unusual exp hx or h/o childhood pna/ asthma or knowledge of premature birth.  Sleeping ok without nocturnal  or early am exacerbation  of respiratory  c/o's or need for noct saba. Also denies any obvious fluctuation of symptoms with weather or environmental changes or other aggravating or alleviating factors except as outlined above   Current Medications, Allergies, Complete Past Medical History, Past Surgical History, Family History, and Social History were reviewed in [employer] record.  ROS  The following are not active complaints unless bolded sore throat, dysphagia, dental problems, itching, sneezing,  nasal congestion or excess/ purulent secretions, ear ache,   fever, chills, sweats, unintended wt loss, pleuritic or exertional cp, hemoptysis,  orthopnea pnd or leg swelling, presyncope, palpitations, heartburn, abdominal pain, anorexia, nausea, vomiting, diarrhea  or change in bowel or urinary habits, change in stools or urine, dysuria,hematuria,  rash, arthralgias, visual complaints, headache, numbness weakness or ataxia or problems with walking or coordination,  change in mood/affect or memory.                   Objective:   Physical Exam  amb indian female nad  No throat clearing  Wt  Readings from Last 3 Encounters:  04/14/13 136 lb (61.689 kg)  03/28/13 137 lb (62.143 kg)  03/24/13 138 lb (62.596 kg)       HEENT: nl dentition, turbinates, and orophanx. Nl external ear canals without cough reflex   NECK :  without JVD/Nodes/TM/ nl carotid upstrokes bilaterally   LUNGS: no acc muscle use, clear to A and P bilaterally without cough on insp or exp maneuvers   CV:  RRR  no s3 or murmur or increase in P2, no edema   ABD:  soft and nontender with nl excursion in the supine position. No bruits or organomegaly, bowel sounds nl  MS:  warm without deformities, calf tenderness, cyanosis or clubbing        CXR  01/11/2013 : Interval clearing of the left lower lobe process. Persistent platelike atelectasis.  CT Chest  04/05/13 1. Patchy areas of paraseptal and central lobar emphysema.  2. Patchy ground-glass  opacities suggest a partial airspace filling  process and could reflect mild edema or inflammation.  3. Linear scarring changes in both lungs  CT sinus 04/05/13 Chronic sphenoid sinus changes/ no a/f levels      Assessment:

## 2013-04-14 NOTE — Patient Instructions (Addendum)
Take omeprazole 40 mg Take 30-60 min before first meal of the day and pepcid 20 mg at bedtime  GERD (REFLUX)  is an extremely common cause of respiratory symptoms, many times with no significant heartburn at all.    It can be treated with medication, but also with lifestyle changes including avoidance of late meals, excessive alcohol, smoking cessation, and avoid fatty foods, chocolate, peppermint, colas, red wine, and acidic juices such as orange juice.  NO MINT OR MENTHOL PRODUCTS SO NO COUGH DROPS  USE SUGARLESS CANDY INSTEAD (jolley ranchers or Stover's)  NO OIL BASED VITAMINS - use powdered substitutes.    See med calendar  (reviewed)  Please schedule a follow up office visit in 2 weeks, sooner if needed with all active meds in two separate bags to match up with your calendar  Late add  w/u with HSP profile, esr/bnp ratio  allergy profile at that point if not better

## 2013-04-15 NOTE — Assessment & Plan Note (Addendum)
-  PFT 2012 normal -no airflow obstruction, no restriction, moderately reduced diffusing capacity - increased neurontin to 200 tid 01/11/2013 > 300 mg tid as of 02/22/2013  -med calendar 03/10/13 and confirmed adherence -CT sinus 04/05/13 >Chronic sphenoid sinusitis. Slight nasal septal deviation right to left. -CT chest 04/05/13 >Patchy areas of paraseptal and central lobar emphysema. . Patchy ground-glass opacities suggest a partial airspace filling process and could reflect mild edema or inflammation. . Linear scarring changes in both lungs.  Despite very wispy changes on ct chest (which are completely non-specific and extremely mild) I strongly still favor  Classic Upper airway cough syndrome, so named because it's frequently impossible to sort out how much is  CR/sinusitis with freq throat clearing (which can be related to primary GERD)   vs  causing  secondary (" extra esophageal")  GERD from wide swings in gastric pressure that occur with throat clearing, often  promoting self use of mint and menthol lozenges that reduce the lower esophageal sphincter tone and exacerbate the problem further in a cyclical fashion.   These are the same pts (now being labeled as having "irritable larynx syndrome" by some cough centers) who not infrequently have a history of having failed to tolerate ace inhibitors,  dry powder inhalers or biphosphonates or report having atypical reflux symptoms that don't respond to standard doses of PPI , and are easily confused as having aecopd or asthma flares by even experienced allergists/ pulmonologists.  rec max rx directed at acid and non acid GERD, cyclical cough and regroup in 2 weeks with all meds in hand and w/u with HSP profile, esr/bnp ratio  allergy profile at that point if not better  Discussed with pt and husband The standardized cough guidelines published in Chest by Lissa Morales in 2006 are still the best available and consist of a multiple step process (up to 12!) ,  not a single office visit,  and are intended  to address this problem logically,  with an alogrithm dependent on response to empiric treatment at  each progressive step  to determine a specific diagnosis with  minimal addtional testing needed. Therefore if adherence is an issue or can't be accurately verified,  it's very unlikely the standard evaluation and treatment will be successful here.    Furthermore, response to therapy (other than acute cough suppression, which should only be used short term with avoidance of narcotic containing cough syrups if possible), can be a gradual process for which the patient may perceive immediate benefit.  Unlike going to an eye doctor where the best perscription is almost always the first one and is immediately effective, this is almost never the case in the management of chronic cough syndromes. Therefore the patient needs to commit up front to consistently adhere to recommendations  for up to 6 weeks of therapy directed at the likely underlying problem(s) before the response can be reasonably evaluated.    Each maintenance medication was reviewed in detail including most importantly the difference between maintenance and as needed and under what circumstances the prns are to be used. This was done in the context of a medication calendar review which provided the patient with a user-friendly unambiguous mechanism for medication administration and reconciliation and provides an action plan for all active problems. It is critical that this be shown to every doctor  for modification during the office visit if necessary so the patient can use it as a working document.

## 2013-04-17 ENCOUNTER — Telehealth: Payer: Self-pay | Admitting: Internal Medicine

## 2013-04-17 NOTE — Telephone Encounter (Signed)
I spoke with patient about results and she verbalized understanding and had no questions 

## 2013-04-17 NOTE — Telephone Encounter (Signed)
Dr. Melvyn Novas do we need to advise anything further for pt regarding CT results other than what as told to her. Please advise thanks

## 2013-04-17 NOTE — Telephone Encounter (Signed)
I have nothing to add about the CT chest which is probably just scarring from previous infections and is extremely mild and not at all likely  related to cough x 10 years - however, I do plan f/u to make sure I'm right about this and will discuss in more detail at next ov

## 2013-04-18 ENCOUNTER — Telehealth: Payer: Self-pay | Admitting: Internal Medicine

## 2013-04-18 MED ORDER — TRAMADOL HCL 50 MG PO TABS: ORAL_TABLET | ORAL | Status: DC

## 2013-04-18 NOTE — Telephone Encounter (Signed)
Pt needs refill on Tramadol. This was last filled on 03/10/13.  MW - please advise on refill. Thanks.

## 2013-04-18 NOTE — Telephone Encounter (Signed)
Rx has been called in per MW. 

## 2013-04-18 NOTE — Telephone Encounter (Signed)
Ok to refill 

## 2013-04-18 NOTE — Telephone Encounter (Signed)
Pt answered the phone and could not hear me after I continued to raise my voice for her to hear me. Will get someone else to try to call her.

## 2013-04-19 ENCOUNTER — Encounter: Payer: Self-pay | Admitting: Internal Medicine

## 2013-04-24 ENCOUNTER — Ambulatory Visit: Payer: Medicare Other | Admitting: Internal Medicine

## 2013-04-28 ENCOUNTER — Ambulatory Visit: Payer: Medicare Other | Admitting: Internal Medicine

## 2013-05-01 ENCOUNTER — Ambulatory Visit: Payer: Medicare Other | Admitting: Physician Assistant

## 2013-05-01 ENCOUNTER — Other Ambulatory Visit (INDEPENDENT_AMBULATORY_CARE_PROVIDER_SITE_OTHER): Payer: Medicare Other

## 2013-05-01 DIAGNOSIS — N390 Urinary tract infection, site not specified: Secondary | ICD-10-CM

## 2013-05-01 NOTE — Telephone Encounter (Signed)
Patient already aware.   KP

## 2013-05-02 DIAGNOSIS — H04129 Dry eye syndrome of unspecified lacrimal gland: Secondary | ICD-10-CM | POA: Diagnosis not present

## 2013-05-02 DIAGNOSIS — N390 Urinary tract infection, site not specified: Secondary | ICD-10-CM | POA: Diagnosis not present

## 2013-05-02 DIAGNOSIS — R51 Headache: Secondary | ICD-10-CM | POA: Diagnosis not present

## 2013-05-02 LAB — POCT URINALYSIS DIPSTICK
Bilirubin, UA: NEGATIVE
Blood, UA: NEGATIVE
Glucose, UA: NEGATIVE
Ketones, UA: NEGATIVE
NITRITE UA: NEGATIVE
PH UA: 6
PROTEIN UA: NEGATIVE
Spec Grav, UA: 1.01
UROBILINOGEN UA: 0.2

## 2013-05-03 ENCOUNTER — Ambulatory Visit (INDEPENDENT_AMBULATORY_CARE_PROVIDER_SITE_OTHER)
Admission: RE | Admit: 2013-05-03 | Discharge: 2013-05-03 | Disposition: A | Discharge status: Home / Self Care | Payer: Medicare Other | Source: Ambulatory Visit | Attending: Internal Medicine | Admitting: Internal Medicine

## 2013-05-03 ENCOUNTER — Ambulatory Visit (INDEPENDENT_AMBULATORY_CARE_PROVIDER_SITE_OTHER): Payer: Medicare Other | Admitting: Internal Medicine

## 2013-05-03 ENCOUNTER — Encounter: Payer: Self-pay | Admitting: Internal Medicine

## 2013-05-03 VITALS — BP 136/88 | HR 100 | Temp 97.9°F | Ht 59.0 in | Wt 136.6 lb

## 2013-05-03 DIAGNOSIS — R05 Cough: Secondary | ICD-10-CM | POA: Diagnosis not present

## 2013-05-03 DIAGNOSIS — R059 Cough, unspecified: Secondary | ICD-10-CM | POA: Diagnosis not present

## 2013-05-03 DIAGNOSIS — I10 Essential (primary) hypertension: Secondary | ICD-10-CM

## 2013-05-03 DIAGNOSIS — J984 Other disorders of lung: Secondary | ICD-10-CM | POA: Diagnosis not present

## 2013-05-03 MED ORDER — FAMOTIDINE 20 MG PO TABS: ORAL_TABLET | ORAL | Status: DC

## 2013-05-03 MED ORDER — VALSARTAN 320 MG PO TABS: 320.0000 mg | ORAL_TABLET | Freq: Every day | ORAL | Status: DC

## 2013-05-03 NOTE — Patient Instructions (Addendum)
Add chlortrimeton 4 mg one at bedtime as an "automatic" medication - ok to take also as needed during the day for drainage  Try stopping the tramadol and only taking as needed if cough comes back and if so return for further work up   Valsartan 320= benicar 40   See calendar for specific medication instructions and bring it back for each and every office visit for every healthcare provider you see.  Without it,  you may not receive the best quality medical care that we feel you deserve.  You will note that the calendar groups together  your maintenance  medications that are timed at particular times of the day.  Think of this as your checklist for what your doctor has instructed you to do until your next evaluation to see what benefit  there is  to staying on a consistent group of medications intended to keep you well.  The other group at the bottom is entirely up to you to use as you see fit  for specific symptoms that may arise between visits that require you to treat them on an as needed basis.  Think of this as your action plan or "what if" list.   Separating the top medications from the bottom group is fundamental to providing you adequate care going forward.    Please remember to go to the x-ray department downstairs for your tests - we will call you with the results when they are available.     Please schedule a follow up visit in 3 months but call sooner if needed  Late add If cough resumes check quatiferon, esr, hsp profile, bnp, allergy profile

## 2013-05-03 NOTE — Progress Notes (Signed)
Subjective:     Patient ID: Mary Flynn, female   DOB: [birth year] MRN: [id number]    Brief patient profile:  79 yo [place] female no childhood resp problems including pregancy then around 2004 began a pattern of recurrent cyclical pattern lasting from sev  months up to a year and then gone up to 4-5 months self referred to pulmonary clinic 01/11/2013 for re-eval of recurrent persistent cough - Workup including allergy testing,barium swallow, 24hour pH probe nonrevealing previously     History of Present Illness  01/11/2013 1st [facility] office visit/ Christopher Glasscock in EMR era with recurrent cough Chief Complaint  Patient presents with  . Acute Visit    Pt c/o increased cough x 2 wks- prod with large amounts of clear sputum.  She states that she coughs until she gags and vomits.   cough tends to be worse with any thing in her mouth and with talking.  Actual mucus production is min and "nothing ever works" to include abx, prednisone, inhalers, gerd rx. eval at [facility] with conclusion "this isn't reflux" Already eval 01/02/13 by Dr Larose Kells rx with pack with sorethroat that has resolved  >>Stop cozar (losartan), stop calcium carbonate, and the fosfamax, Benicar 20 mg one daily  Protonix 40 mg Take 30- 60 min before your first and last meals of the day  Neurontin (gabapentin) 100 mg take 2 with every meal Chlortrimeton 4 mg at bedtime    01/25/2013 Follow up and med review  Patient returns for a two-week followup and medication review.  We reviewed all her medications organized them and updated MAR (med calendar database was down)  with patient education. Appears that she is taking her medications correctly. Patient reports that her cough is much improved. Patient denies any hemoptysis, chest pain, orthopnea, PND, or leg splint. Last visit. Patient was taken off olmesartan, calcium, and Fosamax. She was started on Benicar 20 mg daily. Treated for reflux, prevention regimen with Protonix twice daily. Her  Neurontin was increased to 200 mg 3 times daily. As you start on Chlor-Trimeton 4 mg at bedtime. Chest x-ray last visit showed a clearing of the left lower lobe pneumonia. rec Protonix 40 mg Take 30- 60 min before your first and last meals of the day  Neurontin (gabapentin) 100 mg take 2 with every meal Chlortrimeton 4 mg every 4hr as needed.  Take delsym two tsp every 12 hours and supplement if needed with  tramadol 50 mg up to 2 every 4 hours as needed to suppress the urge to cough.   GERD diet    02/22/2013 f/u ov/Mary Flynn re: chronic cough/ no med calendar Chief Complaint  Patient presents with  . Follow-up    Pt states her symptoms have improved greatly.  Still some nonprod cough and SOB with exertion, excessive talking.     >>neurontin 300 Three times a day    03/10/13 Follow up and  Med review  est med calendar - pt brought all meds with her today.  reports cough is improved except for when she talks/sings or eats.  white mucus production in the mornings. Is starting to feel better but cough is not totally gone.  Does have sinus drainage often  No fever, chest pain or orthopnea.  We reviewed her meds and organized them into a med calendar with pt education .  >>aggressive cough control   03/24/2013 Follow up  Patient returns for a two-week followup. Last visit. She was treated with aggressive cough suppression regimen, along with  trigger control with reflux, rhinitis prevention. Patient reports that she did have significant improvement in her cough. However, she has now developed some nasal congestion, sinus pressure and cough. Has returned despite using gabapentin 3 times daily. Delsym and tramadol for cough control. She denies any hemoptysis, orthopnea, PND, or leg swelling. Last chest x-ray in November showed platelike atelectasis in the left and right lung base rec Continue on Benicar 40 mg one daily  Continue on  Omeprazole 20mg  daily   Continue on Gabapentin 300mg  Three times  a day  .   Use Chlortrimeton 4mg  every 4hr As needed  Drippy nose/drainage.  Take delsym two tsp every 12 hours and supplement if needed with  tramadol 50 mg up to 2 every 4 hours  04/14/2013 acute  ov/Mary Flynn RC:VELFYBO x 10 y cough worse since the last week in Jan 2014 / abn CT chst  Chief Complaint  Patient presents with  . Follow-up    Increased cough since last visit. She c/o HA, sore throat and ear pain x 2 days. Cough is prod with clear sputum.   Never purulent sputum, cough worse day than night. No sob. Not using ppi ac and using home remedies rec Take omeprazole 40 mg Take 30-60 min before first meal of the day and pepcid 20 mg at bedtime GERD   05/03/2013 f/u ov/Mary Flynn re: cough x 10 y Chief Complaint  Patient presents with  . Follow-up    Pt states that her cough is much improved. No new co's today.     Still taking tramadol about twice daily typically in afternoon at at hs  but no cough x 2 weeks   No obvious day to day or daytime variabilty or assoc sob   cp or chest tightness, subjective wheeze overt sinus or hb symptoms. No unusual exp hx or h/o childhood pna/ asthma or knowledge of premature birth.  Sleeping ok without nocturnal  or early am exacerbation  of respiratory  c/o's or need for noct saba. Also denies any obvious fluctuation of symptoms with weather or environmental changes or other aggravating or alleviating factors except as outlined above   Current Medications, Allergies, Complete Past Medical History, Past Surgical History, Family History, and Social History were reviewed in [employer] record.  ROS  The following are not active complaints unless bolded sore throat, dysphagia, dental problems, itching, sneezing,  nasal congestion or excess/ purulent secretions, ear ache,   fever, chills, sweats, unintended wt loss, pleuritic or exertional cp, hemoptysis,  orthopnea pnd or leg swelling, presyncope, palpitations, heartburn, abdominal pain,  anorexia, nausea, vomiting, diarrhea  or change in bowel or urinary habits, change in stools or urine, dysuria,hematuria,  rash, arthralgias, visual complaints, headache, numbness weakness or ataxia or problems with walking or coordination,  change in mood/affect or memory.                   Objective:   Physical Exam  amb [place] female nad  No throat clearing  05/03/2013      136  Wt Readings from Last 3 Encounters:  04/14/13 136 lb (61.689 kg)  03/28/13 137 lb (62.143 kg)  03/24/13 138 lb (62.596 kg)       HEENT: nl dentition, turbinates, and orophanx. Nl external ear canals without cough reflex   NECK :  without JVD/Nodes/TM/ nl carotid upstrokes bilaterally   LUNGS: no acc muscle use, clear to A and P bilaterally without cough on insp or exp maneuvers  CV:  RRR  no s3 or murmur or increase in P2, no edema   ABD:  soft and nontender with nl excursion in the supine position. No bruits or organomegaly, bowel sounds nl  MS:  warm without deformities, calf tenderness, cyanosis or clubbing        CXR  01/11/2013 : Interval clearing of the left lower lobe process. Persistent platelike atelectasis.  CT Chest  04/05/13 1. Patchy areas of paraseptal and central lobar emphysema.  2. Patchy ground-glass opacities suggest a partial airspace filling  process and could reflect mild edema or inflammation.  3. Linear scarring changes in both lungs  CT sinus 04/05/13 Chronic sphenoid sinus changes/ no a/f levels   CXR  05/03/2013 :  Bilateral pleural parenchymal thickening noted consistent with scarring. No focal infiltrate noted. Heart size normal. No pleural effusion or pneumothorax. Degenerative changes thoracic spine      Assessment:

## 2013-05-05 ENCOUNTER — Telehealth: Payer: Self-pay

## 2013-05-05 LAB — URINE CULTURE: Colony Count: >=100000

## 2013-05-05 MED ORDER — CIPROFLOXACIN HCL 500 MG PO TABS: 500.0000 mg | ORAL_TABLET | Freq: Two times a day (BID) | ORAL | Status: DC

## 2013-05-05 NOTE — Assessment & Plan Note (Signed)
Changed cozar and norvasc to benicar 01/11/2013 due to concern contributing to cough - changed benicar to valsartan due to insurance

## 2013-05-05 NOTE — Telephone Encounter (Signed)
discussed with patient and she voiced understanding, Rx printed and left at check for the patient to pick up.      KP

## 2013-05-05 NOTE — Telephone Encounter (Signed)
Message copied by Ewing Schlein on Fri May 05, 2013 10:59 AM ------      Message from: Rosalita Chessman      Created: Fri May 05, 2013  8:34 AM       + cipro ---500 mg 1 po bid for 5 days ------

## 2013-05-05 NOTE — Assessment & Plan Note (Addendum)
-   GI w/u 2004:  Mod BS nl, nl 24 h pH off all GI meds > rec d/c GERD rx  Per Dr Henrene Pastor  -PFT 2012 normal -no airflow obstruction, no restriction, moderately reduced diffusing capacity - increased neurontin to 200 tid 01/11/2013 > 300 mg tid as of 02/22/2013  -med calendar 03/10/13 and confirmed adherence -CT sinus 04/05/13 >Chronic sphenoid sinusitis. Slight nasal septal deviation right to left. -CT chest 04/05/13 >Patchy areas of paraseptal and central lobar emphysema. . Patchy ground-glass opacities suggest a partial airspace filling process and could reflect mild edema or inflammation. . Linear scarring changes in both lungs.  Have not excluded cough variant asthma here but stronlgy favor uacs unrelated to the ct findings above.   I had an extended discussion with the patient today lasting 15 to 20 minutes of a 25 minute visit on the following issues:  For now max rx for uacs and follow with cxr - if symptoms resolve and nothing macroscopic on cxr would not pursue CT changes at this point    Each maintenance medication was reviewed in detail including most importantly the difference between maintenance and as needed and under what circumstances the prns are to be used. This was done in the context of a medication calendar review which provided the patient with a user-friendly unambiguous mechanism for medication administration and reconciliation and provides an action plan for all active problems. It is critical that this be shown to every doctor  for modification during the office visit if necessary so the patient can use it as a working document.

## 2013-05-10 ENCOUNTER — Telehealth: Payer: Self-pay | Admitting: Internal Medicine

## 2013-05-10 MED ORDER — TRAMADOL HCL 50 MG PO TABS: ORAL_TABLET | ORAL | Status: DC

## 2013-05-10 NOTE — Telephone Encounter (Signed)
Tramadol is good for sore throat too but so are warm salt water gargles and non-mint and menthol hard rock candies  Will need to see her primary doctor or be worked in [employer]ural [employer] for me or Tammy NP for specific rx as can't treat this s looking to see what the problem is  Ok to use calcium citrate, not carbonate

## 2013-05-10 NOTE — Telephone Encounter (Signed)
Pt returned call

## 2013-05-10 NOTE — Telephone Encounter (Signed)
Spoke to pt and advised of Dr wert's recommendations.  PT verbalized understanding stating she will try taking Tramadol for throat and see if this helps.  Pt had dropped off letter from insurance regarding not wanting t approve Tramadol.  Spoke with [facility] and [facility] and they ran rx through a different way and is now approved.  Pt aware that rx is ready for pick up.

## 2013-05-10 NOTE — Telephone Encounter (Signed)
Pt was seen on 05/03/13.  States that cough is better but now has constant sorethroat.  Denies fever or pnd.  Pt is using lozenges and truing to keep throat moist but nothing is helping.  Please advise.  Also MW stopped pt's Calcium with vit d but pt wants to restart and needs to know if she should take Calcium carbonate or citrate .  Please advise on this also.  Pt can be reached at [phone].

## 2013-05-10 NOTE — Telephone Encounter (Signed)
lmomtcb x1 

## 2013-05-10 NOTE — Telephone Encounter (Signed)
ATC line busy x 3 wcb 

## 2013-05-16 ENCOUNTER — Ambulatory Visit: Payer: Medicare Other | Admitting: Podiatry

## 2013-05-16 ENCOUNTER — Ambulatory Visit (INDEPENDENT_AMBULATORY_CARE_PROVIDER_SITE_OTHER): Payer: Medicare Other | Admitting: Podiatry

## 2013-05-16 ENCOUNTER — Encounter: Payer: Self-pay | Admitting: Podiatry

## 2013-05-16 ENCOUNTER — Ambulatory Visit (INDEPENDENT_AMBULATORY_CARE_PROVIDER_SITE_OTHER): Payer: Medicare Other

## 2013-05-16 VITALS — BP 151/84 | HR 106 | Resp 17 | Ht 59.0 in | Wt 130.0 lb

## 2013-05-16 DIAGNOSIS — M79609 Pain in unspecified limb: Secondary | ICD-10-CM

## 2013-05-16 DIAGNOSIS — S9030XA Contusion of unspecified foot, initial encounter: Secondary | ICD-10-CM

## 2013-05-16 NOTE — Progress Notes (Signed)
   Subjective:    Patient ID: Mary Flynn, female    DOB: 25-Apr-1934, 79 y.o.   MRN: [id number] Pt states kicked a box of books on Friday, complains of pain in the base of left 2, and 3 toes, but no swelling. HPI    Review of Systems  All other systems reviewed and are negative.       Objective:   Physical Exam: I have reviewed her past medical history medications allergies surgeries and social history. Pulses are palpable bilateral. She has edema to the third digit of the left foot. With pain on in range of motion of the third metatarsophalangeal joint. Radiographic evaluation does not demonstrate any type of osseous abnormalities at this point.        Assessment & Plan:  Assessment: Contusion foot left third toe.  Plan: Discussed etiology pathology conservative versus surgical therapies she is to use her Darco shoe and I will followup with her as needed.

## 2013-05-17 DIAGNOSIS — Z1231 Encounter for screening mammogram for malignant neoplasm of breast: Secondary | ICD-10-CM | POA: Diagnosis not present

## 2013-05-24 ENCOUNTER — Telehealth: Payer: Self-pay | Admitting: Family Medicine

## 2013-05-24 NOTE — Telephone Encounter (Signed)
Patient is aware and voiced understanding.     KP

## 2013-05-24 NOTE — Telephone Encounter (Signed)
05/24/13  Pt called in regarding recent mammogram.  Pt wants to know if Dr.  Etter Sjogren received information regarding tests.

## 2013-05-24 NOTE — Telephone Encounter (Signed)
No-- con't calcium and vita D

## 2013-05-24 NOTE — Telephone Encounter (Signed)
Osteopenia---- If taking ca and vita , we should add fosamax 70 mg weekly #4 11 refills Recheck 2 years    Spoke with patient and she wanted to know if her mammogram results were in, after searching, I advised not at this time. I made her aware of the BMD results and she stated she had been on Forteo in the past and she was switched to Fosamax, however Dr.wert asked her to discontinue when she went to see him for her recurrent cough. Would you like for her to re-start. Please advise     KP

## 2013-05-30 ENCOUNTER — Telehealth: Payer: Self-pay

## 2013-05-30 NOTE — Telephone Encounter (Signed)
Ok to order US breasts b/l per [facility]

## 2013-05-30 NOTE — Telephone Encounter (Signed)
Spoke with patient and advised we were no longer going to have the sample closet and Dr.Lowne said it was ok to pick up Levemir. The husband voiced understanding.  Samples left in the fridge for pick up and medication logged in the book.    89   Husband was concerned about Mammogram. I called [facility] to get a copy of the report, they also made me aware that the patient has dense breast and the recommend that she has a dense breast US, the mammogram report does not reflect that information. I will forward to MD for review.      KP

## 2013-05-31 NOTE — Telephone Encounter (Signed)
Spoke with patient and she said she does not think it is necessary to get the US done. She will discuss with her and call back if he thinks she should have it done.      KP

## 2013-05-31 NOTE — Telephone Encounter (Signed)
noted 

## 2013-06-05 ENCOUNTER — Telehealth: Payer: Self-pay | Admitting: Internal Medicine

## 2013-06-05 MED ORDER — TRAMADOL HCL 50 MG PO TABS: ORAL_TABLET | ORAL | Status: DC

## 2013-06-05 NOTE — Telephone Encounter (Signed)
Ok to give #40 and go back and read my instructions again > needs ov to continue w/u if still coughing p stop tramadol

## 2013-06-05 NOTE — Telephone Encounter (Signed)
Spoke w/ pt. She reports she will call for OV when she comes back in town. Nothing further needed

## 2013-06-05 NOTE — Telephone Encounter (Signed)
Pt last refilled tramadol 50 mg was 05/10/13 #40 x 0 refills. Pt wants 3 month supply Please advise MW thanks

## 2013-06-07 ENCOUNTER — Encounter: Payer: Self-pay | Admitting: Family Medicine

## 2013-06-08 ENCOUNTER — Other Ambulatory Visit (INDEPENDENT_AMBULATORY_CARE_PROVIDER_SITE_OTHER): Payer: Medicare Other

## 2013-06-08 DIAGNOSIS — E1165 Type 2 diabetes mellitus with hyperglycemia: Principal | ICD-10-CM

## 2013-06-08 DIAGNOSIS — IMO0001 Reserved for inherently not codable concepts without codable children: Secondary | ICD-10-CM

## 2013-06-08 LAB — COMPREHENSIVE METABOLIC PANEL
ALK PHOS: 57 U/L (ref 39–117)
ALT: 20 U/L (ref 0–35)
AST: 23 U/L (ref 0–37)
Albumin: 4.2 g/dL (ref 3.5–5.2)
BUN: 11 mg/dL (ref 6–23)
CO2: 30 meq/L (ref 19–32)
Calcium: 9.2 mg/dL (ref 8.4–10.5)
Chloride: 99 mEq/L (ref 96–112)
Creatinine, Ser: 0.6 mg/dL (ref 0.4–1.2)
GFR: 109.41 mL/min (ref >60.00)
Glucose, Bld: 118 mg/dL — ABNORMAL HIGH (ref 70–99)
Potassium: 3.6 mEq/L (ref 3.5–5.1)
Sodium: 137 mEq/L (ref 135–145)
Total Bilirubin: 1.1 mg/dL (ref 0.3–1.2)
Total Protein: 7.2 g/dL (ref 6.0–8.3)

## 2013-06-08 LAB — LIPID PANEL
Cholesterol: 114 mg/dL (ref 0–200)
HDL: 56.6 mg/dL (ref >39.00)
LDL Cholesterol: 46 mg/dL (ref 0–99)
TRIGLYCERIDES: 58 mg/dL (ref 0.0–149.0)
Total CHOL/HDL Ratio: 2
VLDL: 11.6 mg/dL (ref 0.0–40.0)

## 2013-06-08 LAB — MICROALBUMIN / CREATININE URINE RATIO
Creatinine,U: 38.7 mg/dL
MICROALB/CREAT RATIO: 12.7 mg/g (ref 0.0–30.0)
Microalb, Ur: 4.9 mg/dL — ABNORMAL HIGH (ref 0.0–1.9)

## 2013-06-08 LAB — HEMOGLOBIN A1C: Hgb A1c MFr Bld: 7.8 % — ABNORMAL HIGH (ref 4.6–6.5)

## 2013-06-12 ENCOUNTER — Ambulatory Visit (INDEPENDENT_AMBULATORY_CARE_PROVIDER_SITE_OTHER): Payer: Medicare Other | Admitting: Endocrinology

## 2013-06-12 ENCOUNTER — Encounter: Payer: Self-pay | Admitting: Endocrinology

## 2013-06-12 VITALS — BP 160/86 | HR 96 | Temp 98.1°F | Resp 14 | Ht 58.5 in | Wt 133.6 lb

## 2013-06-12 DIAGNOSIS — I10 Essential (primary) hypertension: Secondary | ICD-10-CM

## 2013-06-12 DIAGNOSIS — E785 Hyperlipidemia, unspecified: Secondary | ICD-10-CM | POA: Diagnosis not present

## 2013-06-12 DIAGNOSIS — E1149 Type 2 diabetes mellitus with other diabetic neurological complication: Secondary | ICD-10-CM | POA: Diagnosis not present

## 2013-06-12 NOTE — Patient Instructions (Addendum)
Please check blood sugars at least half the time about 2 hours after any meal and as directed on waking up. Please bring blood sugar monitor to each visit  Get pill box for meds  Write down Novolog dose each time  Check BP weekly

## 2013-06-12 NOTE — Progress Notes (Signed)
Patient ID: Mary Flynn, female   DOB: [birth year], 79 y.o.   MRN: [id number]   Reason for Appointment: Diabetes follow-up   History of Present Illness   Diagnosis: Type 2 DIABETES MELITUS, date of diagnosis:   1995    Previous history: She has been on various regimens of hypoglycemic drugs over the last several years including Actos, Amaryl and Januvia. Because of cost her Januvia was stopped and Actos was stopped because of potential weight gain She was initially started on Levemir insulin because of fasting hyperglycemia in 2009 and the dose has been adjusted periodically In the last year she is also quite mealtime coverage because of postprandial hyperglycemia despite usually eating a low carbohydrate diet The HbgA1c was reported as 7.4 in 1/14   Recent history: Blood glucose control is overall fairly good but blood sugars are being checked only in the morning lately Her A1c tends to be relatively high probably reflecting high postprandial readings She has had relatively stable glucose levels in the morning but is taking slightly more Levemir currently However taking 2 units less of NovoLog at her lunch Not clear if she has had any high readings after her other meals; does have one high reading after breakfast She has been trying to be active and has lost weight  Hypoglycemia :  this is infrequent. Has had only one low glucose of 67 but may feel tired when blood sugars around 80 or below     Oral hypoglycemic drugs:Amaryl, metformin  Side effects from medications: None Insulin regimen: Levemir 16 units ;  Novolog 6 ac lunch usually, none at suppertime         Proper timing of medications in relation to meals: She is taking her NovoLog right after eating       Monitors blood glucose: Once a day.    Glucometer:Accucheck        Blood Glucose readings from meter download:  PREMEAL Breakfast Lunch  PC lunch   ACS  Overall  Glucose range:  75-156   196   76-198   67/94    Mean/median:  116     148    122    Meals: 3 meals per day. she is a vegetarian. Trying to get some protein at all meals including dairy products and tofu  Has lunch around 2 PM      Physical activity: exercise: walking 6/7 days a week or going to the gym           Last visit with dietitian: 2009   Wt Readings from Last 3 Encounters:  06/12/13 133 lb 9.6 oz (60.601 kg)  05/16/13 130 lb (58.968 kg)  05/03/13 136 lb 9.6 oz (61.961 kg)    LABS:  Lab Results  Component Value Date   HGBA1C 7.8* 06/08/2013   HGBA1C 7.9* 03/07/2013   HGBA1C 9.2* 11/21/2012   Lab Results  Component Value Date   MICROALBUR 4.9* 06/08/2013   LDLCALC 46 06/08/2013   CREATININE 0.6 06/08/2013       Medication List       This list is accurate as of: 06/12/13 11:07 AM.  Always use your most recent med list.               aspirin EC 81 MG tablet  Take 81 mg by mouth daily.     atorvastatin 10 MG tablet  Commonly known as:  LIPITOR  TAKE 1 TABLET BY MOUTH EVERY DAY     b complex vitamins  tablet  Take 1 tablet by mouth daily.     chlorpheniramine 4 MG tablet  Commonly known as:  CHLOR-TRIMETON  Take 1 tablet (4 mg total) by mouth every 4 (four) hours as needed for allergies.     ciprofloxacin 500 MG tablet  Commonly known as:  CIPRO  Take 1 tablet (500 mg total) by mouth 2 (two) times daily.     cyclobenzaprine 10 MG tablet  Commonly known as:  FLEXERIL  Take 10 mg by mouth 3 (three) times daily as needed for muscle spasms.     dextromethorphan 30 MG/5ML liquid  Commonly known as:  DELSYM  2 tsp twice daily as needed for cough     famotidine 20 MG tablet  Commonly known as:  PEPCID  One at bedtime     gabapentin 300 MG capsule  Commonly known as:  NEURONTIN  Take 1 capsule (300 mg total) by mouth 3 (three) times daily.     glimepiride 4 MG tablet  Commonly known as:  AMARYL  Take 1 tablet (4 mg total) by mouth 2 (two) times daily.     insulin aspart 100 UNIT/ML injection  Commonly known as:  novoLOG   Inject 6-8 Units into the skin daily.     LEVEMIR FLEXPEN [place]  Inject 14 Units into the skin at bedtime.     metFORMIN 1000 MG tablet  Commonly known as:  GLUCOPHAGE  TAKE 1 TABLET BY MOUTH TWICE DAILY WITH MEALS     mometasone 50 MCG/ACT nasal spray  Commonly known as:  NASONEX  Place 2 sprays into the nose daily as needed.     Niacin CR 1000 MG Tbcr  1 po bid     nystatin ointment  Commonly known as:  MYCOSTATIN  Apply 1 application topically 2 (two) times daily as needed.     pantoprazole 40 MG tablet  Commonly known as:  PROTONIX  Take 40 mg by mouth daily.     traMADol 50 MG tablet  Commonly known as:  ULTRAM  1-2 every 4 hours as needed for cough or pain     valsartan 320 MG tablet  Commonly known as:  DIOVAN  Take 1 tablet (320 mg total) by mouth daily.        Allergies:  Allergies  Allergen Reactions  . Penicillins Hives  . Sulfa Antibiotics Hives  . Sulfonamide Derivatives     Past Medical History  Diagnosis Date  . Diabetes mellitus   . Hyperlipidemia   . Hypertension     Past Surgical History  Procedure Laterality Date  . Appendectomy    . Surgical sterilization  40 years ago  . Refractive surgery      Left Eye    Family History  Problem Relation Age of Onset  . Depression    . Cancer      laryngeal  . Diabetes    . Diabetes Mother     Social History:  reports that she has never smoked. She has never used smokeless tobacco. She reports that she does not drink alcohol or use illicit drugs.  Review of Systems:  She is asking about forgetfulness and decreased memory, she apparently was evaluated by neurologist  Hypertension:   She had a high blood pressure today and she thinks this is from being stressed today. She thinks her blood pressure has been better elsewhere Only on Benicar 20 mg. Has not checked blood pressure at home    Lipids: Has had diabetic  dyslipidemia, usually well controlled  Lab Results  Component Value Date    CHOL 114 06/08/2013   HDL 56.60 06/08/2013   LDLCALC 46 06/08/2013   TRIG 58.0 06/08/2013   CHOLHDL 2 06/08/2013    Neuropathy: She has had some sharp pains in her lower legs but no numbness. These  were controlled with gabapentin  She has been treated with Fosamax by PCP for osteopenia/osteoporosis, off now as recommended by pulmonologist       Examination:   BP 152/92  Pulse 96  Temp(Src) 98.1 F (36.7 C)  Resp 14  Ht 4' 10.5" (1.486 m)  Wt 133 lb 9.6 oz (60.601 kg)  BMI 27.44 kg/m2  SpO2 97%  Body mass index is 27.44 kg/(m^2).   Repeat blood pressure 160/86  ASSESSMENT/ PLAN:   Diabetes type 2   Blood glucose control is looking fairly good as judged by her home readings in the morning Her A1c is relatively higher again probably because of postprandial hyperglycemia She tends to forget to check her sugars after meals especially supper Also has had minimal hypoglycemia She is doing fairly well with 16 units of Levemir in controlling her fasting readings Also most of her readings after lunch are fairly good   Hypertension: Blood pressure is unusually high today. She needs to monitor at home and call her PCP if readings are consistently high  Hyperlipidemia: Excellent control  Elayne Snare 06/12/2013, 11:07 AM

## 2013-07-12 ENCOUNTER — Telehealth: Payer: Self-pay | Admitting: Internal Medicine

## 2013-07-12 NOTE — Telephone Encounter (Signed)
Spoke with the pt  She states forgot the name of the med that took benicars place  I advised it's valsartan  She verbalized understanding  Nothing further needed

## 2013-08-22 ENCOUNTER — Telehealth: Payer: Self-pay | Admitting: Internal Medicine

## 2013-08-22 DIAGNOSIS — R059 Cough, unspecified: Secondary | ICD-10-CM

## 2013-08-22 DIAGNOSIS — R05 Cough: Secondary | ICD-10-CM

## 2013-08-22 NOTE — Telephone Encounter (Signed)
Please advise regarding gabapentin? thanks

## 2013-08-22 NOTE — Telephone Encounter (Signed)
Ok to stop famotidine   Ok to refill tramadol 50 mg one half at hs enough till she returns but should be w/in 6 weeks

## 2013-08-22 NOTE — Telephone Encounter (Signed)
Called spoke with pt. She reports her cough is completely gone now. She takes gabapentin 300 mg TID. She wants to know if she can reduce this? She has not taken famotidine in a while and wants to know if this is okay to stay off? She is also asking for tramadol to have on hand? She usually takes 1/2 tablet of tramadol at bedtime and it helps her sleep through the night w/o cough. Also pt is aware she is due for appt but her spouse is going to have surgery. She will make appt once he recovers. Please advise MW thanks   --pt fine with a call back tomorrow

## 2013-08-23 NOTE — Telephone Encounter (Signed)
Ok to try it bid to see if cough flares and after 2 weeks just once a day for 2 weeks and stop

## 2013-08-23 NOTE — Telephone Encounter (Signed)
LMOM x 1 

## 2013-08-24 MED ORDER — TRAMADOL HCL 50 MG PO TABS: ORAL_TABLET | ORAL | Status: DC

## 2013-08-24 NOTE — Telephone Encounter (Signed)
Spoke with [facility] phoned in for Tramadol Nothing further needed.

## 2013-08-24 NOTE — Telephone Encounter (Signed)
I advised the pt of all recs. She states understanding. I tried to call in refill for tramadol but pharmacy does not open until 9am. I will send message to triage to call in medicaiton later this morning. [place], CMA

## 2013-09-04 ENCOUNTER — Telehealth: Payer: Self-pay | Admitting: Internal Medicine

## 2013-09-04 MED ORDER — HYDROCHLOROTHIAZIDE 25 MG PO TABS: 25.0000 mg | ORAL_TABLET | Freq: Every day | ORAL | Status: DC

## 2013-09-04 NOTE — Telephone Encounter (Signed)
Spoke with pt C/o HBP readings for the last 1-2 days Pt states that she is still taking Benicar 40mg  1 qd Has Rx for Valsartan 320mg , has not filled this yet, wants to complete the Benicar first  BP readings have been 200/100 and 171/97  ----- taking Benicar 40mg  HR 100 Advised by pharmacist at local pharmacy to f/u with prescriber.    Please advise Dr Melvyn Novas. Thanks.

## 2013-09-04 NOTE — Telephone Encounter (Signed)
rec add hczt 25 mg daily (to either benicar or diovan) and f/u either here or with primary in 2 weeks Tammy (prefer primary as they need to assume care of her bp)

## 2013-09-04 NOTE — Telephone Encounter (Signed)
I called made pt aware of recs below. She will see her PCP. RX sent in. Nothing further needed

## 2013-09-06 ENCOUNTER — Ambulatory Visit: Payer: Medicare Other | Attending: Family Medicine | Admitting: Physical Therapy

## 2013-09-06 DIAGNOSIS — M542 Cervicalgia: Secondary | ICD-10-CM | POA: Diagnosis not present

## 2013-09-06 DIAGNOSIS — M545 Low back pain, unspecified: Secondary | ICD-10-CM | POA: Diagnosis not present

## 2013-09-08 ENCOUNTER — Other Ambulatory Visit: Payer: Self-pay | Admitting: Family Medicine

## 2013-09-11 ENCOUNTER — Ambulatory Visit: Payer: Medicare Other | Admitting: Physical Therapy

## 2013-09-11 DIAGNOSIS — M542 Cervicalgia: Secondary | ICD-10-CM | POA: Diagnosis not present

## 2013-09-11 DIAGNOSIS — M545 Low back pain, unspecified: Secondary | ICD-10-CM | POA: Diagnosis not present

## 2013-09-11 NOTE — Telephone Encounter (Signed)
Requesting Baclofen 20mg -Take 1 tablet by mouth three times daily. Last refill:04/07/13;#270,0 Last OV:03/20/13 Please advise.//AB/CMA

## 2013-09-12 NOTE — Telephone Encounter (Signed)
Rx sent to the pharmacy by e-script by Dr. Cherre Blanc

## 2013-09-13 ENCOUNTER — Ambulatory Visit: Payer: Medicare Other | Admitting: Physical Therapy

## 2013-09-14 ENCOUNTER — Telehealth: Payer: Self-pay | Admitting: *Deleted

## 2013-09-14 NOTE — Telephone Encounter (Signed)
Received Assessment and Plan for Treatment form from [facility].  Placed in folder for Dr. Etter Sjogren to review and sign.//AB/CMA

## 2013-09-18 NOTE — Telephone Encounter (Signed)
Forms reviewed and signed by Dr. Etter Sjogren. Forms faxed.  Confirmation received.  Forms labeled and placed in basket for scanning.

## 2013-09-19 ENCOUNTER — Ambulatory Visit: Payer: Medicare Other | Admitting: Physical Therapy

## 2013-09-19 DIAGNOSIS — M545 Low back pain, unspecified: Secondary | ICD-10-CM | POA: Diagnosis not present

## 2013-09-19 DIAGNOSIS — M542 Cervicalgia: Secondary | ICD-10-CM | POA: Diagnosis not present

## 2013-09-21 ENCOUNTER — Ambulatory Visit: Payer: Medicare Other | Admitting: Physical Therapy

## 2013-09-21 DIAGNOSIS — M545 Low back pain, unspecified: Secondary | ICD-10-CM | POA: Diagnosis not present

## 2013-09-21 DIAGNOSIS — M542 Cervicalgia: Secondary | ICD-10-CM | POA: Diagnosis not present

## 2013-09-25 ENCOUNTER — Ambulatory Visit: Payer: Medicare Other | Attending: Family Medicine | Admitting: Physical Therapy

## 2013-09-25 DIAGNOSIS — M545 Low back pain, unspecified: Secondary | ICD-10-CM | POA: Diagnosis not present

## 2013-09-25 DIAGNOSIS — M542 Cervicalgia: Secondary | ICD-10-CM | POA: Diagnosis not present

## 2013-09-27 ENCOUNTER — Ambulatory Visit: Payer: Medicare Other | Admitting: Physical Therapy

## 2013-09-27 DIAGNOSIS — M542 Cervicalgia: Secondary | ICD-10-CM | POA: Diagnosis not present

## 2013-09-28 ENCOUNTER — Encounter: Payer: Self-pay | Admitting: Medical

## 2013-09-28 ENCOUNTER — Ambulatory Visit (INDEPENDENT_AMBULATORY_CARE_PROVIDER_SITE_OTHER): Payer: Medicare Other | Admitting: Medical

## 2013-09-28 VITALS — BP 143/79 | HR 100 | Temp 97.9°F | Wt 133.0 lb

## 2013-09-28 DIAGNOSIS — L089 Local infection of the skin and subcutaneous tissue, unspecified: Secondary | ICD-10-CM | POA: Diagnosis not present

## 2013-09-28 DIAGNOSIS — H60399 Other infective otitis externa, unspecified ear: Secondary | ICD-10-CM | POA: Diagnosis not present

## 2013-09-28 DIAGNOSIS — H60392 Other infective otitis externa, left ear: Secondary | ICD-10-CM

## 2013-09-28 LAB — CBC WITH DIFFERENTIAL/PLATELET
BASOS PCT: 0.2 % (ref 0.0–3.0)
Basophils Absolute: 0 10*3/uL (ref 0.0–0.1)
Eosinophils Absolute: 0.1 10*3/uL (ref 0.0–0.7)
Eosinophils Relative: 0.9 % (ref 0.0–5.0)
HEMATOCRIT: 37.9 % (ref 36.0–46.0)
HEMOGLOBIN: 12.1 g/dL (ref 12.0–15.0)
LYMPHS PCT: 20 % (ref 12.0–46.0)
Lymphs Abs: 1.9 10*3/uL (ref 0.7–4.0)
MCHC: 32 g/dL (ref 30.0–36.0)
MCV: 84.4 fl (ref 78.0–100.0)
MONOS PCT: 8.8 % (ref 3.0–12.0)
Monocytes Absolute: 0.8 10*3/uL (ref 0.1–1.0)
NEUTROS ABS: 6.7 10*3/uL (ref 1.4–7.7)
Neutrophils Relative %: 70.1 % (ref 43.0–77.0)
Platelets: 237 10*3/uL (ref 150.0–400.0)
RBC: 4.48 Mil/uL (ref 3.87–5.11)
RDW: 14.3 % (ref 11.5–15.5)
WBC: 9.6 10*3/uL (ref 4.0–10.5)

## 2013-09-28 MED ORDER — DOXYCYCLINE HYCLATE 100 MG PO TABS: 100.0000 mg | ORAL_TABLET | Freq: Two times a day (BID) | ORAL | Status: DC

## 2013-09-28 MED ORDER — NEOMYCIN-POLYMYXIN-HC 3.5-10000-1 OT SUSP: 3.0000 [drp] | Freq: Three times a day (TID) | OTIC | Status: DC

## 2013-09-28 NOTE — Patient Instructions (Signed)
  I am sending in doxycycline to your pharmacy for possible skin infection. I want you to apply warm compresses to area bid. I also want you to use lozenges to promote salivation since you may have parotitis. Please get lab today. If area worsens or changes toward weekend then UC or ED evaluation. Follow up on Monday. If area remains same would consider ENT referral  for parotitis or other diagnosis.

## 2013-09-28 NOTE — Assessment & Plan Note (Addendum)
Pt has swelling in preauricular region over parotid gland region. Faint warmth. Color normal but tender. Will treat with doxycycline. Due to location of infection wanted better coverage of gram positive. Will recheck on coming Monday or as needed over weekend(UC or ED). Parotitis also in differential so advised lozenge to increase salivation. Warm compress to area bid if not painful.

## 2013-09-28 NOTE — Progress Notes (Signed)
   Subjective:    Patient ID: Mary Flynn, female    DOB: 1934/12/02, 79 y.o.   MRN: [id number]  HPI  Pt states she has swelling in front of her lt ear. This has raised up over past 2 days. Area is worsening. No fever, no chills.  Pt does admit some pain on eating on outside of jaw but not definite  tooth pain. Inside her lt  ear she feels pain. Pt has no fatigue. No vomiting. Pt notes recent her blood sugars have been in mid 100's.    Review of Systems  Constitutional: Negative for fever, chills and fatigue.  HENT: Negative for congestion, ear discharge, ear pain, mouth sores, postnasal drip, rhinorrhea, sinus pressure, sneezing, sore throat, tinnitus and trouble swallowing.        Lt jaw moderate swollen and tender last 2 days with some radiating pain toward her ear. Jaw area hurts worse.  Respiratory: Negative for cough, chest tightness and wheezing.   Cardiovascular: Negative for chest pain and palpitations.  Gastrointestinal: Negative.   Musculoskeletal: Positive for neck pain. Negative for neck stiffness.       Pain left side of jaw and over submandibular region when she turns head.  Neurological: Negative for dizziness, tremors, seizures, facial asymmetry, speech difficulty, light-headedness, numbness and headaches.  Hematological: Negative for adenopathy. Does not bruise/bleed easily.       Maybe swollen lt submandibular node.       Objective:   Physical Exam  General  Mental Status - Alert. General Appearance - Well groomed. Not in acute distress.  Skin Rashes- No Rashes.  HEENT Head- Normal. Ear Auditory Canal - Left- faint narrowed at best. Tragal tenderness mild. Right - Normal.Tympanic Membrane- Left- normal. Right- Normal. Eye Sclera/Conjunctiva- Left- Normal. Right- Normal. Nose & Sinuses Nasal Mucosa- Left- Not Boggy or Congested. Right- Not Boggy or Congested. Mouth & Throat Lips: Upper Lip- Normal: no dryness, cracking, pallor, cyanosis, or vesicular eruption.  Lower Lip-Normal: no dryness, cracking, pallor, cyanosis or vesicular eruption. Buccal Mucosa- Bilateral- No Aphthous ulcers. Oropharynx- No Discharge or Erythema. Tonsils: Characteristics- Bilateral- No Erythema or Congestion. Size/Enlargement- Bilateral- No enlargement. Discharge- bilateral-None. Lt side of mandible. Over parotid area moderate diffuse swollen and tender.(faint warmth) Inside mouth lt side teeth obvious dental work but no caries. Neck Neck- Supple. No Masses. Left submandibular node faintly swollen and faint tender.   Chest and Lung Exam Auscultation: Breath Sounds:-Normal.  Cardiovascular Auscultation:Rythm- Regular.  Murmurs & Other Heart Sounds:Ausculatation of the heart reveal- No Murmurs.  Lymphatic Head & Neck General Head & Neck Lymphatics: Bilateral: Description- No Localized lymphadenopathy.         Assessment & Plan:

## 2013-09-28 NOTE — Progress Notes (Signed)
Pre visit review using our clinic review tool, if applicable. No additional management support is needed unless otherwise documented below in the visit note. 

## 2013-09-28 NOTE — Assessment & Plan Note (Signed)
Very minimal by exam. Canal does not look swollen though mild tender tragal tenderness. Note tm was normal and no mastoid bone tenderness. Will follow closely presently I do not think malignant otitis externa presentation.

## 2013-10-02 ENCOUNTER — Telehealth: Payer: Self-pay | Admitting: Internal Medicine

## 2013-10-02 ENCOUNTER — Ambulatory Visit: Payer: Medicare Other | Admitting: Physical Therapy

## 2013-10-02 DIAGNOSIS — M542 Cervicalgia: Secondary | ICD-10-CM | POA: Diagnosis not present

## 2013-10-02 NOTE — Telephone Encounter (Signed)
Follow instructions as written, the only confusion is that valsartan = diovan so takes hctz with valsartan or benicar but not both - really needs to see her primary with all meds soon to reduce likelihood she's still confusing meds

## 2013-10-02 NOTE — Telephone Encounter (Signed)
Spoke with patient-- Pt wanting to know if she is supposed to take her HCTZ and Valsartan together or the Valsartan only. Per recent telephone note, she was advised to add HCTZ to either the Benicar or Valsartan dose Tanda Rockers, MD at 09/04/2013 3:38 PM     Status: Signed        rec add hczt 25 mg daily (to either benicar or diovan) and f/u either here or with primary in 2 weeks Tammy (prefer primary as they need to assume care of her bp)  Pt states that she has not been taking as directed. Pt seems very confused on what BP meds she is actually taking; patient mentioned that she thinks she is just now starting the [place]. Please advise Dr Melvyn Novas. Pt okay with a call back in the morning.

## 2013-10-03 ENCOUNTER — Ambulatory Visit: Payer: Medicare Other | Admitting: Physical Therapy

## 2013-10-03 DIAGNOSIS — M542 Cervicalgia: Secondary | ICD-10-CM | POA: Diagnosis not present

## 2013-10-03 NOTE — Telephone Encounter (Signed)
Called made pt aware of recs. Nothing further needed 

## 2013-10-09 ENCOUNTER — Ambulatory Visit: Payer: Medicare Other | Admitting: Physical Therapy

## 2013-10-11 ENCOUNTER — Ambulatory Visit: Payer: Medicare Other | Admitting: Physical Therapy

## 2013-10-25 ENCOUNTER — Telehealth: Payer: Self-pay | Admitting: Internal Medicine

## 2013-10-25 MED ORDER — TRAMADOL HCL 50 MG PO TABS: ORAL_TABLET | ORAL | Status: DC

## 2013-10-25 NOTE — Telephone Encounter (Signed)
Called and spoke with pt and she requested a refill of the tramadol.  This has been sent to her pharmacy and pt is aware. Nothing further is needed.

## 2013-11-06 ENCOUNTER — Other Ambulatory Visit: Payer: Self-pay

## 2013-11-06 DIAGNOSIS — E118 Type 2 diabetes mellitus with unspecified complications: Principal | ICD-10-CM

## 2013-11-06 DIAGNOSIS — IMO0002 Reserved for concepts with insufficient information to code with codable children: Secondary | ICD-10-CM

## 2013-11-06 DIAGNOSIS — E1165 Type 2 diabetes mellitus with hyperglycemia: Secondary | ICD-10-CM

## 2013-11-06 MED ORDER — METFORMIN HCL 1000 MG PO TABS: ORAL_TABLET | ORAL | Status: DC

## 2013-11-10 ENCOUNTER — Ambulatory Visit: Payer: Medicare Other | Admitting: Family Medicine

## 2013-11-16 ENCOUNTER — Telehealth: Payer: Self-pay | Admitting: Internal Medicine

## 2013-11-16 ENCOUNTER — Ambulatory Visit: Payer: Medicare Other

## 2013-11-16 MED ORDER — CHLORPHENIRAMINE MALEATE 4 MG PO TABS: ORAL_TABLET | ORAL | Status: DC

## 2013-11-16 NOTE — Telephone Encounter (Signed)
lmomtcb x1 

## 2013-11-16 NOTE — Telephone Encounter (Signed)
Spoke with pt.  She wants to know if MW wants her to still take Chlor trimeton.  Per MW instructions at last ov pt should be taking Chlortrimeton 4mg  1 at bedtime and prn daytime for drainage.  Pt verbalized understanding and rx was sent to pharmacy.  Pt will call to schedule f/u with MW after she gets back from out of town.Marland Kitchen

## 2013-11-17 ENCOUNTER — Ambulatory Visit (INDEPENDENT_AMBULATORY_CARE_PROVIDER_SITE_OTHER): Payer: Medicare Other

## 2013-11-17 ENCOUNTER — Ambulatory Visit: Payer: Medicare Other

## 2013-11-17 DIAGNOSIS — Z23 Encounter for immunization: Secondary | ICD-10-CM | POA: Diagnosis not present

## 2013-11-24 ENCOUNTER — Encounter: Payer: Self-pay | Admitting: Internal Medicine

## 2013-12-05 ENCOUNTER — Other Ambulatory Visit: Payer: Self-pay | Admitting: *Deleted

## 2013-12-05 ENCOUNTER — Telehealth: Payer: Self-pay | Admitting: Endocrinology

## 2013-12-05 ENCOUNTER — Other Ambulatory Visit: Payer: Self-pay

## 2013-12-05 DIAGNOSIS — E1149 Type 2 diabetes mellitus with other diabetic neurological complication: Secondary | ICD-10-CM

## 2013-12-05 DIAGNOSIS — E119 Type 2 diabetes mellitus without complications: Secondary | ICD-10-CM

## 2013-12-05 DIAGNOSIS — E1165 Type 2 diabetes mellitus with hyperglycemia: Principal | ICD-10-CM

## 2013-12-05 DIAGNOSIS — IMO0002 Reserved for concepts with insufficient information to code with codable children: Secondary | ICD-10-CM

## 2013-12-05 MED ORDER — GLIMEPIRIDE 4 MG PO TABS: 4.0000 mg | ORAL_TABLET | Freq: Two times a day (BID) | ORAL | Status: DC

## 2013-12-05 MED ORDER — METFORMIN HCL 1000 MG PO TABS: ORAL_TABLET | ORAL | Status: DC

## 2013-12-05 NOTE — Telephone Encounter (Signed)
For now only 30 day prescription

## 2013-12-05 NOTE — Telephone Encounter (Signed)
Please see below, patient last seen here in April, has appointment schedule for the end of this month, wants refills for one year.

## 2013-12-05 NOTE — Telephone Encounter (Signed)
Glimepiride and Metformin 1000 mg twice dailyneeds to be rx for the pt please [place] for one year supply

## 2013-12-05 NOTE — Telephone Encounter (Signed)
Per Dr. Dwyane Dee, one month refill only until patient is seen in office

## 2013-12-08 ENCOUNTER — Telehealth: Payer: Self-pay | Admitting: *Deleted

## 2013-12-08 NOTE — Telephone Encounter (Signed)
Patient dropped off form for a written order for lumbar orthosis. Form forwarded to Dr. Birdie Riddle (in Dr. Nonda Lou absence) for review/signature. JG//CMA

## 2013-12-11 ENCOUNTER — Ambulatory Visit: Payer: Medicare Other | Admitting: Physician Assistant

## 2013-12-13 ENCOUNTER — Other Ambulatory Visit (INDEPENDENT_AMBULATORY_CARE_PROVIDER_SITE_OTHER): Payer: Medicare Other

## 2013-12-13 DIAGNOSIS — E1149 Type 2 diabetes mellitus with other diabetic neurological complication: Secondary | ICD-10-CM

## 2013-12-13 DIAGNOSIS — IMO0002 Reserved for concepts with insufficient information to code with codable children: Secondary | ICD-10-CM

## 2013-12-13 DIAGNOSIS — E1165 Type 2 diabetes mellitus with hyperglycemia: Principal | ICD-10-CM

## 2013-12-13 LAB — COMPREHENSIVE METABOLIC PANEL
ALT: 19 U/L (ref 0–35)
AST: 26 U/L (ref 0–37)
Albumin: 3.9 g/dL (ref 3.5–5.2)
Alkaline Phosphatase: 56 U/L (ref 39–117)
BUN: 11 mg/dL (ref 6–23)
CALCIUM: 9.4 mg/dL (ref 8.4–10.5)
CHLORIDE: 98 meq/L (ref 96–112)
CO2: 30 meq/L (ref 19–32)
CREATININE: 0.7 mg/dL (ref 0.4–1.2)
GFR: 90.67 mL/min (ref >60.00)
GLUCOSE: 145 mg/dL — AB (ref 70–99)
Potassium: 3.9 mEq/L (ref 3.5–5.1)
Sodium: 137 mEq/L (ref 135–145)
Total Bilirubin: 1.4 mg/dL — ABNORMAL HIGH (ref 0.2–1.2)
Total Protein: 7.4 g/dL (ref 6.0–8.3)

## 2013-12-13 LAB — HEMOGLOBIN A1C: HEMOGLOBIN A1C: 8.7 % — AB (ref 4.6–6.5)

## 2013-12-19 ENCOUNTER — Telehealth: Payer: Self-pay | Admitting: Internal Medicine

## 2013-12-19 NOTE — Telephone Encounter (Signed)
Yes but needs to agree to come in to office with all meds to regroup as this is not definitive rx so make her appt and give her enough to get o appt (up to 6 per day in interim) either me or Tammy NP

## 2013-12-19 NOTE — Telephone Encounter (Signed)
Called and spoke with pt and she stated that she has to go out of town on a family emergency to [place] and she is not sure when she will be back, but she will make an appt as soon as she comes back.  Pt is asking to have #40 tablets called to the pharmacy. MW are you ok with this?

## 2013-12-19 NOTE — Telephone Encounter (Signed)
MW pt was last seen by you in march 2015.  Pt is requesting a refill of the tramadol.  May we call this in for her?  Please advise. Thanks.  She has no pending appts with you.    Allergies  Allergen Reactions  . Penicillins Hives  . Sulfa Antibiotics Hives  . Sulfonamide Derivatives     Current Outpatient Prescriptions on File Prior to Visit  Medication Sig Dispense Refill  . aspirin EC 81 MG tablet Take 81 mg by mouth daily.      Marland Kitchen atorvastatin (LIPITOR) 10 MG tablet TAKE 1 TABLET BY MOUTH EVERY DAY  90 tablet  1  . b complex vitamins tablet Take 1 tablet by mouth daily.      . baclofen (LIORESAL) 20 MG tablet TAKE 1 TABLET BY MOUTH THREE TIMES DAILY  270 tablet  0  . chlorpheniramine (CHLOR-TRIMETON) 4 MG tablet Take one tablet daily at bedtime  And then every 4 hrs as needed during the day for drainage  30 tablet  0  . ciprofloxacin (CIPRO) 500 MG tablet Take 1 tablet (500 mg total) by mouth 2 (two) times daily.  10 tablet  0  . cyclobenzaprine (FLEXERIL) 10 MG tablet Take 10 mg by mouth 3 (three) times daily as needed for muscle spasms.      Marland Kitchen dextromethorphan (DELSYM) 30 MG/5ML liquid 2 tsp twice daily as needed for cough      . doxycycline (VIBRA-TABS) 100 MG tablet Take 1 tablet (100 mg total) by mouth 2 (two) times daily.  20 tablet  0  . famotidine (PEPCID) 20 MG tablet One at bedtime  90 tablet  4  . gabapentin (NEURONTIN) 300 MG capsule Take 1 capsule (300 mg total) by mouth 3 (three) times daily.  270 capsule  1  . glimepiride (AMARYL) 4 MG tablet Take 1 tablet (4 mg total) by mouth 2 (two) times daily.  60 tablet  0  . hydrochlorothiazide (HYDRODIURIL) 25 MG tablet Take 1 tablet (25 mg total) by mouth daily.  30 tablet  6  . insulin aspart (NOVOLOG) 100 UNIT/ML injection Inject 6-8 Units into the skin daily.       . Insulin Detemir (LEVEMIR FLEXPEN [place]) Inject 14 Units into the skin at bedtime.       . metFORMIN (GLUCOPHAGE) 1000 MG tablet TAKE 1 TABLET BY MOUTH TWICE DAILY WITH  MEALS-- LABS ARE DUE NOW  60 tablet  0  . mometasone (NASONEX) 50 MCG/ACT nasal spray Place 2 sprays into the nose daily as needed.      . neomycin-polymyxin-hydrocortisone (CORTISPORIN) 3.5-10000-1 otic suspension Place 3 drops into the left ear 3 (three) times daily.  10 mL  0  . Niacin CR 1000 MG TBCR 1 po bid  60 each  5  . nystatin ointment (MYCOSTATIN) Apply 1 application topically 2 (two) times daily as needed.      . pantoprazole (PROTONIX) 40 MG tablet TAKE 1 TABLET BY MOUTH ONCE DAILY  90 tablet  1  . traMADol (ULTRAM) 50 MG tablet Take 1/2 tablet at bedtime as needed  30 tablet  0  . valsartan (DIOVAN) 320 MG tablet Take 1 tablet (320 mg total) by mouth daily.  90 tablet  3   No current facility-administered medications on file prior to visit.

## 2013-12-20 NOTE — Telephone Encounter (Signed)
No it is not appropriate.  We can work her in today if needed but no tramadol s ov

## 2013-12-20 NOTE — Telephone Encounter (Signed)
Pt refused OV with MW--states that it is rainy and she does not have a ride to come here. Offered appt tomorrow or Friday with MW, refused stating that she leaves tomorrow for [place]. Offered appt again for today pt refused stating that she cannot come in and to forget the refills.  Advised the patient to call if she changes her mind about app today.   Will send to MW as Juluis Rainier. Nothing further needed.

## 2013-12-21 ENCOUNTER — Other Ambulatory Visit: Payer: Self-pay | Admitting: *Deleted

## 2013-12-21 ENCOUNTER — Ambulatory Visit (INDEPENDENT_AMBULATORY_CARE_PROVIDER_SITE_OTHER): Payer: Medicare Other | Admitting: Endocrinology

## 2013-12-21 ENCOUNTER — Encounter: Payer: Self-pay | Admitting: Endocrinology

## 2013-12-21 ENCOUNTER — Other Ambulatory Visit: Payer: Self-pay | Admitting: Family Medicine

## 2013-12-21 VITALS — BP 123/76 | HR 99 | Temp 98.2°F | Resp 14 | Ht 58.5 in | Wt 133.8 lb

## 2013-12-21 DIAGNOSIS — IMO0002 Reserved for concepts with insufficient information to code with codable children: Secondary | ICD-10-CM

## 2013-12-21 DIAGNOSIS — I10 Essential (primary) hypertension: Secondary | ICD-10-CM | POA: Diagnosis not present

## 2013-12-21 DIAGNOSIS — E1149 Type 2 diabetes mellitus with other diabetic neurological complication: Secondary | ICD-10-CM

## 2013-12-21 DIAGNOSIS — E1165 Type 2 diabetes mellitus with hyperglycemia: Principal | ICD-10-CM

## 2013-12-21 MED ORDER — GLIMEPIRIDE 4 MG PO TABS: 4.0000 mg | ORAL_TABLET | Freq: Two times a day (BID) | ORAL | Status: DC

## 2013-12-21 MED ORDER — TRAMADOL HCL 50 MG PO TABS: ORAL_TABLET | ORAL | Status: DC

## 2013-12-21 MED ORDER — METFORMIN HCL 1000 MG PO TABS: ORAL_TABLET | ORAL | Status: DC

## 2013-12-21 NOTE — Telephone Encounter (Signed)
Last seen 03/20/13 and filled 09/11/13 #120 Please advise     KP

## 2013-12-21 NOTE — Progress Notes (Signed)
Patient ID: Mary Flynn, female   DOB: 04-26-1934, 79 y.o.   MRN: [id number]   Reason for Appointment: Diabetes follow-up   History of Present Illness   Diagnosis: Type 2 DIABETES MELITUS, date of diagnosis:   1995    Previous history: She has been on various regimens of hypoglycemic drugs over the last several years including Actos, Amaryl and Januvia. Because of cost her Januvia was stopped and Actos was stopped because of potential weight gain She was initially started on Levemir insulin because of fasting hyperglycemia in 2009 and the dose has been adjusted periodically In the last year she is also quite mealtime coverage because of postprandial hyperglycemia despite usually eating a low carbohydrate diet  Recent history:  She is again not regular with her follow-up and was last seen about 6 months ago Blood glucose control is overall significantly worse with A1c nearly 9% She thinks that this is partly related to her stress level, intercurrent illnesses and difficulty getting her insulin dose adjusted correctly However she has started using a new glucose monitor recently and is checking readings only fasting This is despite her being reminded regularly to check consistently after meals She arbitrarily takes extra insulin when she is feeling her sugars going up but not adjusting it based on her meals is Occasionally will forget her insulin when she is eating out  Hypoglycemia :  none recently except for one reading of 65 fasting. Has occasional symptoms of low sugars especially if she takes extra NovoLog     Oral hypoglycemic drugs:Amaryl, metformin  Side effects from medications: None Insulin regimen: Levemir 14-16 units ;  Novolog 4-10 ac lunch usually, sometimes also with suppertime         Proper timing of medications in relation to meals: She is taking her NovoLog right after eating       Monitors blood glucose: Once a day.    Glucometer:Accucheck        Blood Glucose readings from  meter download:  PREMEAL Breakfast Lunch Dinner Bedtime Overall  Glucose range: 65-168      Mean/median:     127    Meals: 3 meals per day. she is a vegetarian. Trying to get some protein at all meals including dairy products and tofu  Has lunch around 2 PM      Physical activity: exercise: walking 2 miles 6/7 days a week or going to the gym           Last visit with dietitian: 2009   Wt Readings from Last 3 Encounters:  12/21/13 133 lb 12.8 oz (60.691 kg)  09/28/13 133 lb (60.328 kg)  06/12/13 133 lb 9.6 oz (60.601 kg)    LABS:  Lab Results  Component Value Date   HGBA1C 8.7* 12/13/2013   HGBA1C 7.8* 06/08/2013   HGBA1C 7.9* 03/07/2013   Lab Results  Component Value Date   MICROALBUR 4.9* 06/08/2013   LDLCALC 46 06/08/2013   CREATININE 0.7 12/13/2013       Medication List       This list is accurate as of: 12/21/13  1:56 PM.  Always use your most recent med list.               aspirin EC 81 MG tablet  Take 81 mg by mouth daily.     atorvastatin 10 MG tablet  Commonly known as:  LIPITOR  TAKE 1 TABLET BY MOUTH EVERY DAY     b complex vitamins tablet  Take  1 tablet by mouth daily.     baclofen 20 MG tablet  Commonly known as:  LIORESAL  TAKE 1 TABLET BY MOUTH THREE TIMES DAILY     chlorpheniramine 4 MG tablet  Commonly known as:  CHLOR-TRIMETON  Take one tablet daily at bedtime  And then every 4 hrs as needed during the day for drainage     cyclobenzaprine 10 MG tablet  Commonly known as:  FLEXERIL  Take 10 mg by mouth 3 (three) times daily as needed for muscle spasms.     gabapentin 300 MG capsule  Commonly known as:  NEURONTIN  Take 1 capsule (300 mg total) by mouth 3 (three) times daily.     glimepiride 4 MG tablet  Commonly known as:  AMARYL  Take 1 tablet (4 mg total) by mouth 2 (two) times daily.     hydrochlorothiazide 25 MG tablet  Commonly known as:  HYDRODIURIL  Take 1 tablet (25 mg total) by mouth daily.     insulin aspart 100 UNIT/ML  injection  Commonly known as:  novoLOG  Inject 6-8 Units into the skin daily.     LEVEMIR FLEXPEN [place]  Inject 14 Units into the skin at bedtime.     metFORMIN 1000 MG tablet  Commonly known as:  GLUCOPHAGE  TAKE 1 TABLET BY MOUTH TWICE DAILY WITH MEALS-- LABS ARE DUE NOW     mometasone 50 MCG/ACT nasal spray  Commonly known as:  NASONEX  Place 2 sprays into the nose daily as needed.     neomycin-polymyxin-hydrocortisone 3.5-10000-1 otic suspension  Commonly known as:  CORTISPORIN  Place 3 drops into the left ear 3 (three) times daily.     Niacin CR 1000 MG Tbcr  1 po bid     nystatin ointment  Commonly known as:  MYCOSTATIN  Apply 1 application topically 2 (two) times daily as needed.     pantoprazole 40 MG tablet  Commonly known as:  PROTONIX  TAKE 1 TABLET BY MOUTH ONCE DAILY     traMADol 50 MG tablet  Commonly known as:  ULTRAM  Take 1/2 tablet at bedtime as needed     valsartan 320 MG tablet  Commonly known as:  DIOVAN  Take 1 tablet (320 mg total) by mouth daily.        Allergies:  Allergies  Allergen Reactions  . Penicillins Hives  . Sulfa Antibiotics Hives  . Sulfonamide Derivatives     Past Medical History  Diagnosis Date  . Diabetes mellitus   . Hyperlipidemia   . Hypertension     Past Surgical History  Procedure Laterality Date  . Appendectomy    . Surgical sterilization  40 years ago  . Refractive surgery      Left Eye    Family History  Problem Relation Age of Onset  . Depression    . Cancer      laryngeal  . Diabetes    . Diabetes Mother     Social History:  reports that she has never smoked. She has never used smokeless tobacco. She reports that she does not drink alcohol or use illicit drugs.  Review of Systems:  Hypertension:    Only on Diovan  320 mg. Has not checked blood pressure at home    Lipids: Has had diabetic dyslipidemia, usually well controlled  Lab Results  Component Value Date   CHOL 114 06/08/2013   HDL  56.60 06/08/2013   LDLCALC 46 06/08/2013   TRIG 58.0  06/08/2013   CHOLHDL 2 06/08/2013    Neuropathy: She has had some sharp pains in her lower legs but no numbness. These  were controlled with gabapentin  She has been treated with Fosamax by PCP for osteopenia/osteoporosis, off this medication as recommended by pulmonologist who was treating her for cough       Examination:   BP 123/76  Pulse 99  Temp(Src) 98.2 F (36.8 C)  Resp 14  Ht 4' 10.5" (1.486 m)  Wt 133 lb 12.8 oz (60.691 kg)  BMI 27.48 kg/m2  SpO2 97%  Body mass index is 27.48 kg/(m^2).   No apparent edema  ASSESSMENT/ PLAN:   Diabetes type 2   Blood glucose control is overall worse with A1c 8.7 Difficult to adjust her insulin as she does not appear to have high fasting readings Most likely she has had significant postprandial hyperglycemia but is not monitoring glucose after meals Discussed that she will need to bring the readings after meals to help adjust her NovoLog dose Also may consider Levemir twice a day if sugars are consistently high before dinner Although she is exercising she has had variability in her diet and she thinks stress in taking care of her husband when he was having surgery is causing high sugars at times Also has had occasional hypoglycemia but not documented. She is doing fairly well with 14-16 units of Levemir in controlling her fasting readings; however she is adjusting the insulin daily based on the previous day's reading  Hypertension: Blood pressure is well controlled as of today. She needs to monitor at home and call her PCP if readings are consistently high  Counseling time over 50% of today's 25 minute visit  Patient Instructions  Please check blood sugars at least half the time about 2 hours after any meal and 4-5  times per week on waking up.  Please bring blood sugar monitor to each visit  Levemir 15 and adjust weekly        Mace Weinberg 12/21/2013, 1:56 PM

## 2013-12-21 NOTE — Patient Instructions (Addendum)
Please check blood sugars at least half the time about 2 hours after any meal and 4-5  times per week on waking up.  Please bring blood sugar monitor to each visit  Levemir 15 and adjust weekly

## 2013-12-27 DIAGNOSIS — H60542 Acute eczematoid otitis externa, left ear: Secondary | ICD-10-CM | POA: Diagnosis not present

## 2013-12-27 DIAGNOSIS — H6122 Impacted cerumen, left ear: Secondary | ICD-10-CM | POA: Diagnosis not present

## 2013-12-27 DIAGNOSIS — J310 Chronic rhinitis: Secondary | ICD-10-CM | POA: Diagnosis not present

## 2014-02-02 ENCOUNTER — Ambulatory Visit (INDEPENDENT_AMBULATORY_CARE_PROVIDER_SITE_OTHER): Payer: Medicare Other | Admitting: Family Medicine

## 2014-02-02 ENCOUNTER — Encounter: Payer: Self-pay | Admitting: Family Medicine

## 2014-02-02 VITALS — BP 142/84 | HR 95 | Temp 98.0°F | Ht 59.0 in | Wt 130.4 lb

## 2014-02-02 DIAGNOSIS — Z0000 Encounter for general adult medical examination without abnormal findings: Secondary | ICD-10-CM | POA: Diagnosis not present

## 2014-02-02 DIAGNOSIS — Z23 Encounter for immunization: Secondary | ICD-10-CM | POA: Diagnosis not present

## 2014-02-02 DIAGNOSIS — E1165 Type 2 diabetes mellitus with hyperglycemia: Secondary | ICD-10-CM

## 2014-02-02 DIAGNOSIS — M159 Polyosteoarthritis, unspecified: Secondary | ICD-10-CM

## 2014-02-02 DIAGNOSIS — E785 Hyperlipidemia, unspecified: Secondary | ICD-10-CM

## 2014-02-02 DIAGNOSIS — M150 Primary generalized (osteo)arthritis: Secondary | ICD-10-CM | POA: Diagnosis not present

## 2014-02-02 DIAGNOSIS — I10 Essential (primary) hypertension: Secondary | ICD-10-CM

## 2014-02-02 DIAGNOSIS — R8299 Other abnormal findings in urine: Secondary | ICD-10-CM | POA: Diagnosis not present

## 2014-02-02 DIAGNOSIS — R8290 Unspecified abnormal findings in urine: Secondary | ICD-10-CM

## 2014-02-02 DIAGNOSIS — IMO0002 Reserved for concepts with insufficient information to code with codable children: Secondary | ICD-10-CM

## 2014-02-02 LAB — POCT URINALYSIS DIPSTICK
Bilirubin, UA: NEGATIVE
Blood, UA: NEGATIVE
GLUCOSE UA: NEGATIVE
Ketones, UA: NEGATIVE
NITRITE UA: NEGATIVE
Spec Grav, UA: 1.015
Urobilinogen, UA: 2
pH, UA: 7

## 2014-02-02 MED ORDER — TRAMADOL HCL 50 MG PO TABS: ORAL_TABLET | ORAL | Status: DC

## 2014-02-02 NOTE — Progress Notes (Signed)
Pre visit review using our clinic review tool, if applicable. No additional management support is needed unless otherwise documented below in the visit note. 

## 2014-02-02 NOTE — Progress Notes (Signed)
Subjective:    Mary Flynn is a 79 y.o. female who presents for Medicare Annual/Subsequent preventive examination.  Preventive Screening-Counseling & Management  Tobacco History  Smoking status  . Never Smoker   Smokeless tobacco  . Never Used     Problems Prior to Visit 1. No problems  Current Problems (verified) Patient Active Problem List   Diagnosis Date Noted  . Skin infection 09/28/2013  . Otitis, externa, infective 09/28/2013  . Cough 01/11/2013  . Tinea cruris 07/27/2012  . Urinary frequency 07/27/2012  . Eczema 03/08/2012  . Headache 03/08/2012  . Arm pain, right 08/24/2011  . Depression 07/16/2011  . Compression fracture of L2 lumbar vertebra 06/08/2011  . Chronic insomnia 04/26/2011  . PPD positive 04/17/2011  . URINARY INCONTINENCE, STRESS, FEMALE 04/29/2010  . MEMORY LOSS 12/23/2009  . Chronic bronchitis NEC 12/23/2009  . DIABETIC PERIPHERAL NEUROPATHY 06/28/2009  . Acute low back pain due to trauma 12/20/2008  . FIBROIDS, UTERUS 12/18/2008  . ABDOMINAL BLOATING 12/17/2008  . CONSTIPATION 01/24/2008  . GERD 01/23/2008  . SPRAIN&STRAIN OTHER SPECIFIED SITES KNEE&LEG 07/20/2007  . UTI 05/16/2007  . LACERATION OF FINGER 10/12/2006  . THYROID NODULE, RIGHT 08/05/2006  . Type II or unspecified type diabetes mellitus with neurological manifestations, uncontrolled(250.62) 08/05/2006  . HYPERLIPIDEMIA 08/05/2006  . HYPERTENSION 08/05/2006  . OSTEOPOROSIS NOS 08/05/2006    Medications Prior to Visit Current Outpatient Prescriptions on File Prior to Visit  Medication Sig Dispense Refill  . aspirin EC 81 MG tablet Take 81 mg by mouth daily.    Marland Kitchen atorvastatin (LIPITOR) 10 MG tablet TAKE 1 TABLET BY MOUTH EVERY DAY 90 tablet 1  . b complex vitamins tablet Take 1 tablet by mouth daily.    . baclofen (LIORESAL) 20 MG tablet TAKE 1 TABLET BY MOUTH THREE TIMES DAILY 270 tablet 0  . chlorpheniramine (CHLOR-TRIMETON) 4 MG tablet Take one tablet daily at bedtime   And then every 4 hrs as needed during the day for drainage 30 tablet 0  . cyclobenzaprine (FLEXERIL) 10 MG tablet Take 10 mg by mouth 3 (three) times daily as needed for muscle spasms.    Marland Kitchen gabapentin (NEURONTIN) 300 MG capsule Take 1 capsule (300 mg total) by mouth 3 (three) times daily. 270 capsule 1  . glimepiride (AMARYL) 4 MG tablet Take 1 tablet (4 mg total) by mouth 2 (two) times daily. 60 tablet 3  . hydrochlorothiazide (HYDRODIURIL) 25 MG tablet Take 1 tablet (25 mg total) by mouth daily. 30 tablet 6  . insulin aspart (NOVOLOG) 100 UNIT/ML injection Inject 6-8 Units into the skin daily.     . Insulin Detemir (LEVEMIR FLEXPEN [place]) Inject 14 Units into the skin at bedtime.     . metFORMIN (GLUCOPHAGE) 1000 MG tablet TAKE 1 TABLET BY MOUTH TWICE DAILY WITH MEALS-- LABS ARE DUE NOW 60 tablet 3  . mometasone (NASONEX) 50 MCG/ACT nasal spray Place 2 sprays into the nose daily as needed.    . neomycin-polymyxin-hydrocortisone (CORTISPORIN) 3.5-10000-1 otic suspension Place 3 drops into the left ear 3 (three) times daily. 10 mL 0  . Niacin CR 1000 MG TBCR 1 po bid 60 each 5  . nystatin ointment (MYCOSTATIN) Apply 1 application topically 2 (two) times daily as needed.    . pantoprazole (PROTONIX) 40 MG tablet TAKE 1 TABLET BY MOUTH ONCE DAILY 90 tablet 1  . traMADol (ULTRAM) 50 MG tablet Take 1/2 tablet at bedtime as needed 15 tablet 0  . valsartan (DIOVAN) 320 MG tablet  Take 1 tablet (320 mg total) by mouth daily. 90 tablet 3   No current facility-administered medications on file prior to visit.    Current Medications (verified) Current Outpatient Prescriptions  Medication Sig Dispense Refill  . aspirin EC 81 MG tablet Take 81 mg by mouth daily.    Marland Kitchen atorvastatin (LIPITOR) 10 MG tablet TAKE 1 TABLET BY MOUTH EVERY DAY 90 tablet 1  . b complex vitamins tablet Take 1 tablet by mouth daily.    . baclofen (LIORESAL) 20 MG tablet TAKE 1 TABLET BY MOUTH THREE TIMES DAILY 270 tablet 0  .  chlorpheniramine (CHLOR-TRIMETON) 4 MG tablet Take one tablet daily at bedtime  And then every 4 hrs as needed during the day for drainage 30 tablet 0  . cyclobenzaprine (FLEXERIL) 10 MG tablet Take 10 mg by mouth 3 (three) times daily as needed for muscle spasms.    Marland Kitchen gabapentin (NEURONTIN) 300 MG capsule Take 1 capsule (300 mg total) by mouth 3 (three) times daily. 270 capsule 1  . glimepiride (AMARYL) 4 MG tablet Take 1 tablet (4 mg total) by mouth 2 (two) times daily. 60 tablet 3  . hydrochlorothiazide (HYDRODIURIL) 25 MG tablet Take 1 tablet (25 mg total) by mouth daily. 30 tablet 6  . insulin aspart (NOVOLOG) 100 UNIT/ML injection Inject 6-8 Units into the skin daily.     . Insulin Detemir (LEVEMIR FLEXPEN [place]) Inject 14 Units into the skin at bedtime.     . metFORMIN (GLUCOPHAGE) 1000 MG tablet TAKE 1 TABLET BY MOUTH TWICE DAILY WITH MEALS-- LABS ARE DUE NOW 60 tablet 3  . mometasone (NASONEX) 50 MCG/ACT nasal spray Place 2 sprays into the nose daily as needed.    . neomycin-polymyxin-hydrocortisone (CORTISPORIN) 3.5-10000-1 otic suspension Place 3 drops into the left ear 3 (three) times daily. 10 mL 0  . Niacin CR 1000 MG TBCR 1 po bid 60 each 5  . nystatin ointment (MYCOSTATIN) Apply 1 application topically 2 (two) times daily as needed.    . pantoprazole (PROTONIX) 40 MG tablet TAKE 1 TABLET BY MOUTH ONCE DAILY 90 tablet 1  . traMADol (ULTRAM) 50 MG tablet Take 1/2 tablet at bedtime as needed 15 tablet 0  . valsartan (DIOVAN) 320 MG tablet Take 1 tablet (320 mg total) by mouth daily. 90 tablet 3   No current facility-administered medications for this visit.     Allergies (verified) Penicillins; Sulfa antibiotics; and Sulfonamide derivatives   PAST HISTORY  Family History Family History  Problem Relation Age of Onset  . Depression    . Cancer      laryngeal  . Diabetes    . Diabetes Mother     Social History History  Substance Use Topics  . Smoking status: Never Smoker    . Smokeless tobacco: Never Used  . Alcohol Use: No     Are there smokers in your home (other than you)? No  Risk Factors Current exercise habits: walking  Dietary issues discussed: na   Cardiac risk factors: advanced age (older than 29 for men, 84 for women), diabetes mellitus, dyslipidemia and hypertension.  Depression Screen (Note: if answer to either of the following is "Yes", a more complete depression screening is indicated)   Over the past two weeks, have you felt down, depressed or hopeless? No  Over the past two weeks, have you felt little interest or pleasure in doing things? No  Have you lost interest or pleasure in daily life? No  Do you  often feel hopeless? No  Do you cry easily over simple problems? No  Activities of Daily Living In your present state of health, do you have any difficulty performing the following activities?:  Driving? No Managing money?  No Feeding yourself? No Getting from bed to chair? No Climbing a flight of stairs? No Preparing food and eating?: No Bathing or showering? No Getting dressed: No Getting to the toilet? No Using the toilet:No Moving around from place to place: No In the past year have you fallen or had a near fall?:No   Are you sexually active?  Yes  Do you have more than one partner?  No  Hearing Difficulties: No Do you often ask people to speak up or repeat themselves? No Do you experience ringing or noises in your ears? No Do you have difficulty understanding soft or whispered voices? No   Do you feel that you have a problem with memory? No  Do you often misplace items? No  Do you feel safe at home?  No  Cognitive Testing  Alert? Yes  Normal Appearance?Yes  Oriented to person? Yes  Place? Yes   Time? Yes  Recall of three objects?  Yes  Can perform simple calculations? Yes  Displays appropriate judgment?Yes  Can read the correct time from a watch face?Yes   Advanced Directives have been discussed with the  patient? Yes  List the Names of Other Physician/Practitioners you currently use: 1.  Endo-- Kumar 2.  Eye-- [facility]burner, [place] 3.  Foot-- Hiatt  Indicate any recent Medical Services you may have received from other than [facility] providers in the past year (date may be approximate).  Immunization History  Administered Date(s) Administered  . Influenza Split 11/24/2010  . Influenza Whole 11/30/2007, 12/05/2008, 12/04/2009  . Influenza, High Dose Seasonal PF 11/17/2013  . Influenza,inj,Quad PF,36+ Mos 11/18/2012  . PPD Test 02/16/2011, 04/14/2011  . Pneumococcal Polysaccharide-23 12/09/2007  . Td 10/12/2006  . Zoster 06/04/2010    Screening Tests Health Maintenance  Topic Date Due  . OPHTHALMOLOGY EXAM  11/03/2012  . FOOT EXAM  11/18/2013  . MAMMOGRAM  05/18/2014  . URINE MICROALBUMIN  06/09/2014  . HEMOGLOBIN A1C  06/14/2014  . INFLUENZA VACCINE  09/24/2014  . TETANUS/TDAP  10/11/2016  . COLONOSCOPY  01/31/2018  . PNEUMOCOCCAL POLYSACCHARIDE VACCINE AGE 14 AND OVER  Completed  . ZOSTAVAX  Completed    All answers were reviewed with the patient and necessary referrals were made:  Garnet Koyanagi, DO   02/02/2014   History reviewed:  She  has a past medical history of Diabetes mellitus; Hyperlipidemia; and Hypertension. She  does not have any pertinent problems on file. She  has past surgical history that includes Appendectomy; surgical sterilization (40 years ago); and Refractive surgery. Her family history includes Cancer in an other family member; Depression in an other family member; Diabetes in her mother and another family member. She  reports that she has never smoked. She has never used smokeless tobacco. She reports that she does not drink alcohol or use illicit drugs. She has a current medication list which includes the following prescription(s): aspirin ec, atorvastatin, b complex vitamins, baclofen, chlorpheniramine, cyclobenzaprine, gabapentin, glimepiride,  hydrochlorothiazide, insulin aspart, insulin detemir, metformin, mometasone, neomycin-polymyxin-hydrocortisone, niacin cr, nystatin ointment, pantoprazole, tramadol, and valsartan. Current Outpatient Prescriptions on File Prior to Visit  Medication Sig Dispense Refill  . aspirin EC 81 MG tablet Take 81 mg by mouth daily.    Marland Kitchen atorvastatin (LIPITOR) 10 MG tablet TAKE 1  TABLET BY MOUTH EVERY DAY 90 tablet 1  . b complex vitamins tablet Take 1 tablet by mouth daily.    . baclofen (LIORESAL) 20 MG tablet TAKE 1 TABLET BY MOUTH THREE TIMES DAILY 270 tablet 0  . chlorpheniramine (CHLOR-TRIMETON) 4 MG tablet Take one tablet daily at bedtime  And then every 4 hrs as needed during the day for drainage 30 tablet 0  . cyclobenzaprine (FLEXERIL) 10 MG tablet Take 10 mg by mouth 3 (three) times daily as needed for muscle spasms.    Marland Kitchen gabapentin (NEURONTIN) 300 MG capsule Take 1 capsule (300 mg total) by mouth 3 (three) times daily. 270 capsule 1  . glimepiride (AMARYL) 4 MG tablet Take 1 tablet (4 mg total) by mouth 2 (two) times daily. 60 tablet 3  . hydrochlorothiazide (HYDRODIURIL) 25 MG tablet Take 1 tablet (25 mg total) by mouth daily. 30 tablet 6  . insulin aspart (NOVOLOG) 100 UNIT/ML injection Inject 6-8 Units into the skin daily.     . Insulin Detemir (LEVEMIR FLEXPEN [place]) Inject 14 Units into the skin at bedtime.     . metFORMIN (GLUCOPHAGE) 1000 MG tablet TAKE 1 TABLET BY MOUTH TWICE DAILY WITH MEALS-- LABS ARE DUE NOW 60 tablet 3  . mometasone (NASONEX) 50 MCG/ACT nasal spray Place 2 sprays into the nose daily as needed.    . neomycin-polymyxin-hydrocortisone (CORTISPORIN) 3.5-10000-1 otic suspension Place 3 drops into the left ear 3 (three) times daily. 10 mL 0  . Niacin CR 1000 MG TBCR 1 po bid 60 each 5  . nystatin ointment (MYCOSTATIN) Apply 1 application topically 2 (two) times daily as needed.    . pantoprazole (PROTONIX) 40 MG tablet TAKE 1 TABLET BY MOUTH ONCE DAILY 90 tablet 1  . traMADol  (ULTRAM) 50 MG tablet Take 1/2 tablet at bedtime as needed 15 tablet 0  . valsartan (DIOVAN) 320 MG tablet Take 1 tablet (320 mg total) by mouth daily. 90 tablet 3   No current facility-administered medications on file prior to visit.   She is allergic to penicillins; sulfa antibiotics; and sulfonamide derivatives.  Review of Systems  Review of Systems  Constitutional: Negative for activity change, appetite change and fatigue.  HENT: Negative for hearing loss, congestion, tinnitus and ear discharge.   Eyes: Negative for visual disturbance (see optho q1y -)  Respiratory: Negative for cough, chest tightness and shortness of breath.   Cardiovascular: Negative for chest pain, palpitations and leg swelling.  Gastrointestinal: Negative for abdominal pain, diarrhea, constipation and abdominal distention.  Genitourinary: Negative for urgency, frequency, decreased urine volume and difficulty urinating.  Musculoskeletal: Negative for back pain, arthralgias and gait problem.  Skin: Negative for color change, pallor and rash.  Neurological: Negative for dizziness, light-headedness, numbness and headaches.  Hematological: Negative for adenopathy. Does not bruise/bleed easily.  Psychiatric/Behavioral: Negative for suicidal ideas, confusion, sleep disturbance, self-injury, dysphoric mood, decreased concentration and agitation.  Pt is able to read and write and can do all ADLs No risk for falling No abuse/ violence in home      Objective:     Vision by Snellen chart: opth  Body mass index is 26.32 kg/(m^2). BP 140/90 mmHg  Pulse 95  Temp(Src) 98 F (36.7 C) (Oral)  Ht 4\' 11"  (1.499 m)  Wt 130 lb 6.4 oz (59.149 kg)  BMI 26.32 kg/m2  SpO2 98%  BP 140/90 mmHg  Pulse 95  Temp(Src) 98 F (36.7 C) (Oral)  Ht 4\' 11"  (1.499 m)  Wt 130  lb 6.4 oz (59.149 kg)  BMI 26.32 kg/m2  SpO2 98% General appearance: alert, cooperative, appears stated age and no distress Head: Normocephalic, without  obvious abnormality, atraumatic Eyes: negative findings: lids and lashes normal and pupils equal, round, reactive to light and accomodation Ears: normal TM's and external ear canals both ears Nose: Nares normal. Septum midline. Mucosa normal. No drainage or sinus tenderness. Throat: lips, mucosa, and tongue normal; teeth and gums normal Neck: no adenopathy, no carotid bruit, no JVD, supple, symmetrical, trachea midline and thyroid not enlarged, symmetric, no tenderness/mass/nodules Back: symmetric, no curvature. ROM normal. No CVA tenderness. Lungs: clear to auscultation bilaterally Breasts: normal appearance, no masses or tenderness Heart: S1, S2 normal Abdomen: soft, non-tender; bowel sounds normal; no masses,  no organomegaly Pelvic: not indicated; post-menopausal, no abnormal Pap smears in past Extremities: extremities normal, atraumatic, no cyanosis or edema Pulses: 2+ and symmetric Skin: Skin color, texture, turgor normal. No rashes or lesions Lymph nodes: Cervical, supraclavicular, and axillary nodes normal. Neurologic: Alert and oriented X 3, normal strength and tone. Normal symmetric reflexes. Normal coordination and gait Psych- no depression, no anxiety      Assessment:     cpe     Plan:    ghm tud  Check labs During the course of the visit the patient was educated and counseled about appropriate screening and preventive services including:    Pneumococcal vaccine   Screening mammography  Diabetes screening  Glaucoma screening  Advanced directives: has NO advanced directive - not interested in additional information  Diet review for nutrition referral? Yes ____  Not Indicated __x__   Patient Instructions (the written plan) was given to the patient.  Medicare Attestation I have personally reviewed: The patient's medical and social history Their use of alcohol, tobacco or illicit drugs Their current medications and supplements The patient's functional  ability including ADLs,fall risks, home safety risks, cognitive, and hearing and visual impairment Diet and physical activities Evidence for depression or mood disorders  The patient's weight, height, BMI, and visual acuity have been recorded in the chart.  I have made referrals, counseling, and provided education to the patient based on review of the above and I have provided the patient with a written personalized care plan for preventive services.    1. Primary osteoarthritis involving multiple joints Refill meds - traMADol (ULTRAM) 50 MG tablet; Take 1 tablet at bedtime as needed  Dispense: 40 tablet; Refill: 0  2. Medicare annual wellness visit, subsequent    3. Diabetes mellitus type II, uncontrolled Per endo - Basic metabolic panel - POCT urinalysis dipstick  4. Hyperlipidemia LDL goal <70 Check labs - POCT urinalysis dipstick - Hepatic function panel - Lipid panel  5. Essential hypertension Stable, con't meds - Basic metabolic panel - CBC with Differential - POCT urinalysis dipstick   Garnet Koyanagi, DO   02/02/2014

## 2014-02-02 NOTE — Addendum Note (Signed)
Addended by: Modena Morrow D on: 02/02/2014 04:32 PM   Modules accepted: Orders

## 2014-02-02 NOTE — Patient Instructions (Signed)
Preventive Care for Adults A healthy lifestyle and preventive care can promote health and wellness. Preventive health guidelines for women include the following key practices.  A routine yearly physical is a good way to check with your health care provider about your health and preventive screening. It is a chance to share any concerns and updates on your health and to receive a thorough exam.  Visit your dentist for a routine exam and preventive care every 6 months. Brush your teeth twice a day and floss once a day. Good oral hygiene prevents tooth decay and gum disease.  The frequency of eye exams is based on your age, health, family medical history, use of contact lenses, and other factors. Follow your health care provider's recommendations for frequency of eye exams.  Eat a healthy diet. Foods like vegetables, fruits, whole grains, low-fat dairy products, and lean protein foods contain the nutrients you need without too many calories. Decrease your intake of foods high in solid fats, added sugars, and salt. Eat the right amount of calories for you.Get information about a proper diet from your health care provider, if necessary.  Regular physical exercise is one of the most important things you can do for your health. Most adults should get at least 150 minutes of moderate-intensity exercise (any activity that increases your heart rate and causes you to sweat) each week. In addition, most adults need muscle-strengthening exercises on 2 or more days a week.  Maintain a healthy weight. The body mass index (BMI) is a screening tool to identify possible weight problems. It provides an estimate of body fat based on height and weight. Your health care provider can find your BMI and can help you achieve or maintain a healthy weight.For adults 20 years and older:  A BMI below 18.5 is considered underweight.  A BMI of 18.5 to 24.9 is normal.  A BMI of 25 to 29.9 is considered overweight.  A BMI of  30 and above is considered obese.  Maintain normal blood lipids and cholesterol levels by exercising and minimizing your intake of saturated fat. Eat a balanced diet with plenty of fruit and vegetables. Blood tests for lipids and cholesterol should begin at age 76 and be repeated every 5 years. If your lipid or cholesterol levels are high, you are over 50, or you are at high risk for heart disease, you may need your cholesterol levels checked more frequently.Ongoing high lipid and cholesterol levels should be treated with medicines if diet and exercise are not working.  If you smoke, find out from your health care provider how to quit. If you do not use tobacco, do not start.  Lung cancer screening is recommended for adults aged 22-80 years who are at high risk for developing lung cancer because of a history of smoking. A yearly low-dose CT scan of the lungs is recommended for people who have at least a 30-pack-year history of smoking and are a current smoker or have quit within the past 15 years. A pack year of smoking is smoking an average of 1 pack of cigarettes a day for 1 year (for example: 1 pack a day for 30 years or 2 packs a day for 15 years). Yearly screening should continue until the smoker has stopped smoking for at least 15 years. Yearly screening should be stopped for people who develop a health problem that would prevent them from having lung cancer treatment.  If you are pregnant, do not drink alcohol. If you are breastfeeding,  be very cautious about drinking alcohol. If you are not pregnant and choose to drink alcohol, do not have more than 1 drink per day. One drink is considered to be 12 ounces (355 mL) of beer, 5 ounces (148 mL) of wine, or 1.5 ounces (44 mL) of liquor.  Avoid use of street drugs. Do not share needles with anyone. Ask for help if you need support or instructions about stopping the use of drugs.  High blood pressure causes heart disease and increases the risk of  stroke. Your blood pressure should be checked at least every 1 to 2 years. Ongoing high blood pressure should be treated with medicines if weight loss and exercise do not work.  If you are 3-86 years old, ask your health care provider if you should take aspirin to prevent strokes.  Diabetes screening involves taking a blood sample to check your fasting blood sugar level. This should be done once every 3 years, after age 67, if you are within normal weight and without risk factors for diabetes. Testing should be considered at a younger age or be carried out more frequently if you are overweight and have at least 1 risk factor for diabetes.  Breast cancer screening is essential preventive care for women. You should practice "breast self-awareness." This means understanding the normal appearance and feel of your breasts and may include breast self-examination. Any changes detected, no matter how small, should be reported to a health care provider. Women in their 8s and 30s should have a clinical breast exam (CBE) by a health care provider as part of a regular health exam every 1 to 3 years. After age 70, women should have a CBE every year. Starting at age 25, women should consider having a mammogram (breast X-ray test) every year. Women who have a family history of breast cancer should talk to their health care provider about genetic screening. Women at a high risk of breast cancer should talk to their health care providers about having an MRI and a mammogram every year.  Breast cancer gene (BRCA)-related cancer risk assessment is recommended for women who have family members with BRCA-related cancers. BRCA-related cancers include breast, ovarian, tubal, and peritoneal cancers. Having family members with these cancers may be associated with an increased risk for harmful changes (mutations) in the breast cancer genes BRCA1 and BRCA2. Results of the assessment will determine the need for genetic counseling and  BRCA1 and BRCA2 testing.  Routine pelvic exams to screen for cancer are no longer recommended for nonpregnant women who are considered low risk for cancer of the pelvic organs (ovaries, uterus, and vagina) and who do not have symptoms. Ask your health care provider if a screening pelvic exam is right for you.  If you have had past treatment for cervical cancer or a condition that could lead to cancer, you need Pap tests and screening for cancer for at least 20 years after your treatment. If Pap tests have been discontinued, your risk factors (such as having a new sexual partner) need to be reassessed to determine if screening should be resumed. Some women have medical problems that increase the chance of getting cervical cancer. In these cases, your health care provider may recommend more frequent screening and Pap tests.  The HPV test is an additional test that may be used for cervical cancer screening. The HPV test looks for the virus that can cause the cell changes on the cervix. The cells collected during the Pap test can be  tested for HPV. The HPV test could be used to screen women aged 30 years and older, and should be used in women of any age who have unclear Pap test results. After the age of 30, women should have HPV testing at the same frequency as a Pap test.  Colorectal cancer can be detected and often prevented. Most routine colorectal cancer screening begins at the age of 50 years and continues through age 75 years. However, your health care provider may recommend screening at an earlier age if you have risk factors for colon cancer. On a yearly basis, your health care provider may provide home test kits to check for hidden blood in the stool. Use of a small camera at the end of a tube, to directly examine the colon (sigmoidoscopy or colonoscopy), can detect the earliest forms of colorectal cancer. Talk to your health care provider about this at age 50, when routine screening begins. Direct  exam of the colon should be repeated every 5-10 years through age 75 years, unless early forms of pre-cancerous polyps or small growths are found.  People who are at an increased risk for hepatitis B should be screened for this virus. You are considered at high risk for hepatitis B if:  You were born in a country where hepatitis B occurs often. Talk with your health care provider about which countries are considered high risk.  Your parents were born in a high-risk country and you have not received a shot to protect against hepatitis B (hepatitis B vaccine).  You have HIV or AIDS.  You use needles to inject street drugs.  You live with, or have sex with, someone who has hepatitis B.  You get hemodialysis treatment.  You take certain medicines for conditions like cancer, organ transplantation, and autoimmune conditions.  Hepatitis C blood testing is recommended for all people born from 1945 through 1965 and any individual with known risks for hepatitis C.  Practice safe sex. Use condoms and avoid high-risk sexual practices to reduce the spread of sexually transmitted infections (STIs). STIs include gonorrhea, chlamydia, syphilis, trichomonas, herpes, HPV, and human immunodeficiency virus (HIV). Herpes, HIV, and HPV are viral illnesses that have no cure. They can result in disability, cancer, and death.  You should be screened for sexually transmitted illnesses (STIs) including gonorrhea and chlamydia if:  You are sexually active and are younger than 24 years.  You are older than 24 years and your health care provider tells you that you are at risk for this type of infection.  Your sexual activity has changed since you were last screened and you are at an increased risk for chlamydia or gonorrhea. Ask your health care provider if you are at risk.  If you are at risk of being infected with HIV, it is recommended that you take a prescription medicine daily to prevent HIV infection. This is  called preexposure prophylaxis (PrEP). You are considered at risk if:  You are a heterosexual woman, are sexually active, and are at increased risk for HIV infection.  You take drugs by injection.  You are sexually active with a partner who has HIV.  Talk with your health care provider about whether you are at high risk of being infected with HIV. If you choose to begin PrEP, you should first be tested for HIV. You should then be tested every 3 months for as long as you are taking PrEP.  Osteoporosis is a disease in which the bones lose minerals and strength   with aging. This can result in serious bone fractures or breaks. The risk of osteoporosis can be identified using a bone density scan. Women ages 65 years and over and women at risk for fractures or osteoporosis should discuss screening with their health care providers. Ask your health care provider whether you should take a calcium supplement or vitamin D to reduce the rate of osteoporosis.  Menopause can be associated with physical symptoms and risks. Hormone replacement therapy is available to decrease symptoms and risks. You should talk to your health care provider about whether hormone replacement therapy is right for you.  Use sunscreen. Apply sunscreen liberally and repeatedly throughout the day. You should seek shade when your shadow is shorter than you. Protect yourself by wearing long sleeves, pants, a wide-brimmed hat, and sunglasses year round, whenever you are outdoors.  Once a month, do a whole body skin exam, using a mirror to look at the skin on your back. Tell your health care provider of new moles, moles that have irregular borders, moles that are larger than a pencil eraser, or moles that have changed in shape or color.  Stay current with required vaccines (immunizations).  Influenza vaccine. All adults should be immunized every year.  Tetanus, diphtheria, and acellular pertussis (Td, Tdap) vaccine. Pregnant women should  receive 1 dose of Tdap vaccine during each pregnancy. The dose should be obtained regardless of the length of time since the last dose. Immunization is preferred during the 27th-36th week of gestation. An adult who has not previously received Tdap or who does not know her vaccine status should receive 1 dose of Tdap. This initial dose should be followed by tetanus and diphtheria toxoids (Td) booster doses every 10 years. Adults with an unknown or incomplete history of completing a 3-dose immunization series with Td-containing vaccines should begin or complete a primary immunization series including a Tdap dose. Adults should receive a Td booster every 10 years.  Varicella vaccine. An adult without evidence of immunity to varicella should receive 2 doses or a second dose if she has previously received 1 dose. Pregnant females who do not have evidence of immunity should receive the first dose after pregnancy. This first dose should be obtained before leaving the health care facility. The second dose should be obtained 4-8 weeks after the first dose.  Human papillomavirus (HPV) vaccine. Females aged 13-26 years who have not received the vaccine previously should obtain the 3-dose series. The vaccine is not recommended for use in pregnant females. However, pregnancy testing is not needed before receiving a dose. If a female is found to be pregnant after receiving a dose, no treatment is needed. In that case, the remaining doses should be delayed until after the pregnancy. Immunization is recommended for any person with an immunocompromised condition through the age of 26 years if she did not get any or all doses earlier. During the 3-dose series, the second dose should be obtained 4-8 weeks after the first dose. The third dose should be obtained 24 weeks after the first dose and 16 weeks after the second dose.  Zoster vaccine. One dose is recommended for adults aged 60 years or older unless certain conditions are  present.  Measles, mumps, and rubella (MMR) vaccine. Adults born before 1957 generally are considered immune to measles and mumps. Adults born in 1957 or later should have 1 or more doses of MMR vaccine unless there is a contraindication to the vaccine or there is laboratory evidence of immunity to   each of the three diseases. A routine second dose of MMR vaccine should be obtained at least 28 days after the first dose for students attending postsecondary schools, health care workers, or international travelers. People who received inactivated measles vaccine or an unknown type of measles vaccine during 1963-1967 should receive 2 doses of MMR vaccine. People who received inactivated mumps vaccine or an unknown type of mumps vaccine before 1979 and are at high risk for mumps infection should consider immunization with 2 doses of MMR vaccine. For females of childbearing age, rubella immunity should be determined. If there is no evidence of immunity, females who are not pregnant should be vaccinated. If there is no evidence of immunity, females who are pregnant should delay immunization until after pregnancy. Unvaccinated health care workers born before 1957 who lack laboratory evidence of measles, mumps, or rubella immunity or laboratory confirmation of disease should consider measles and mumps immunization with 2 doses of MMR vaccine or rubella immunization with 1 dose of MMR vaccine.  Pneumococcal 13-valent conjugate (PCV13) vaccine. When indicated, a person who is uncertain of her immunization history and has no record of immunization should receive the PCV13 vaccine. An adult aged 19 years or older who has certain medical conditions and has not been previously immunized should receive 1 dose of PCV13 vaccine. This PCV13 should be followed with a dose of pneumococcal polysaccharide (PPSV23) vaccine. The PPSV23 vaccine dose should be obtained at least 8 weeks after the dose of PCV13 vaccine. An adult aged 19  years or older who has certain medical conditions and previously received 1 or more doses of PPSV23 vaccine should receive 1 dose of PCV13. The PCV13 vaccine dose should be obtained 1 or more years after the last PPSV23 vaccine dose.  Pneumococcal polysaccharide (PPSV23) vaccine. When PCV13 is also indicated, PCV13 should be obtained first. All adults aged 65 years and older should be immunized. An adult younger than age 65 years who has certain medical conditions should be immunized. Any person who resides in a nursing home or long-term care facility should be immunized. An adult smoker should be immunized. People with an immunocompromised condition and certain other conditions should receive both PCV13 and PPSV23 vaccines. People with human immunodeficiency virus (HIV) infection should be immunized as soon as possible after diagnosis. Immunization during chemotherapy or radiation therapy should be avoided. Routine use of PPSV23 vaccine is not recommended for American Indians, [place] Natives, or people younger than 65 years unless there are medical conditions that require PPSV23 vaccine. When indicated, people who have unknown immunization and have no record of immunization should receive PPSV23 vaccine. One-time revaccination 5 years after the first dose of PPSV23 is recommended for people aged 19-64 years who have chronic kidney failure, nephrotic syndrome, asplenia, or immunocompromised conditions. People who received 1-2 doses of PPSV23 before age 65 years should receive another dose of PPSV23 vaccine at age 65 years or later if at least 5 years have passed since the previous dose. Doses of PPSV23 are not needed for people immunized with PPSV23 at or after age 65 years.  Meningococcal vaccine. Adults with asplenia or persistent complement component deficiencies should receive 2 doses of quadrivalent meningococcal conjugate (MenACWY-D) vaccine. The doses should be obtained at least 2 months apart.  Microbiologists working with certain meningococcal bacteria, military recruits, people at risk during an outbreak, and people who travel to or live in countries with a high rate of meningitis should be immunized. A first-year college student up through age   21 years who is living in a residence hall should receive a dose if she did not receive a dose on or after her 16th birthday. Adults who have certain high-risk conditions should receive one or more doses of vaccine.  Hepatitis A vaccine. Adults who wish to be protected from this disease, have certain high-risk conditions, work with hepatitis A-infected animals, work in hepatitis A research labs, or travel to or work in countries with a high rate of hepatitis A should be immunized. Adults who were previously unvaccinated and who anticipate close contact with an international adoptee during the first 60 days after arrival in the [place] from a country with a high rate of hepatitis A should be immunized.  Hepatitis B vaccine. Adults who wish to be protected from this disease, have certain high-risk conditions, may be exposed to blood or other infectious body fluids, are household contacts or sex partners of hepatitis B positive people, are clients or workers in certain care facilities, or travel to or work in countries with a high rate of hepatitis B should be immunized.  Haemophilus influenzae type b (Hib) vaccine. A previously unvaccinated person with asplenia or sickle cell disease or having a scheduled splenectomy should receive 1 dose of Hib vaccine. Regardless of previous immunization, a recipient of a hematopoietic stem cell transplant should receive a 3-dose series 6-12 months after her successful transplant. Hib vaccine is not recommended for adults with HIV infection. Preventive Services / Frequency Ages 64 to 68 years  Blood pressure check.** / Every 1 to 2 years.  Lipid and cholesterol check.** / Every 5 years beginning at age  22.  Clinical breast exam.** / Every 3 years for women in their 88s and 53s.  BRCA-related cancer risk assessment.** / For women who have family members with a BRCA-related cancer (breast, ovarian, tubal, or peritoneal cancers).  Pap test.** / Every 2 years from ages 90 through 51. Every 3 years starting at age 21 through age 56 or 3 with a history of 3 consecutive normal Pap tests.  HPV screening.** / Every 3 years from ages 24 through ages 1 to 46 with a history of 3 consecutive normal Pap tests.  Hepatitis C blood test.** / For any individual with known risks for hepatitis C.  Skin self-exam. / Monthly.  Influenza vaccine. / Every year.  Tetanus, diphtheria, and acellular pertussis (Tdap, Td) vaccine.** / Consult your health care provider. Pregnant women should receive 1 dose of Tdap vaccine during each pregnancy. 1 dose of Td every 10 years.  Varicella vaccine.** / Consult your health care provider. Pregnant females who do not have evidence of immunity should receive the first dose after pregnancy.  HPV vaccine. / 3 doses over 6 months, if 72 and younger. The vaccine is not recommended for use in pregnant females. However, pregnancy testing is not needed before receiving a dose.  Measles, mumps, rubella (MMR) vaccine.** / You need at least 1 dose of MMR if you were born in 1957 or later. You may also need a 2nd dose. For females of childbearing age, rubella immunity should be determined. If there is no evidence of immunity, females who are not pregnant should be vaccinated. If there is no evidence of immunity, females who are pregnant should delay immunization until after pregnancy.  Pneumococcal 13-valent conjugate (PCV13) vaccine.** / Consult your health care provider.  Pneumococcal polysaccharide (PPSV23) vaccine.** / 1 to 2 doses if you smoke cigarettes or if you have certain conditions.  Meningococcal vaccine.** /  1 dose if you are age 19 to 21 years and a first-year college  student living in a residence hall, or have one of several medical conditions, you need to get vaccinated against meningococcal disease. You may also need additional booster doses.  Hepatitis A vaccine.** / Consult your health care provider.  Hepatitis B vaccine.** / Consult your health care provider.  Haemophilus influenzae type b (Hib) vaccine.** / Consult your health care provider. Ages 40 to 64 years  Blood pressure check.** / Every 1 to 2 years.  Lipid and cholesterol check.** / Every 5 years beginning at age 20 years.  Lung cancer screening. / Every year if you are aged 55-80 years and have a 30-pack-year history of smoking and currently smoke or have quit within the past 15 years. Yearly screening is stopped once you have quit smoking for at least 15 years or develop a health problem that would prevent you from having lung cancer treatment.  Clinical breast exam.** / Every year after age 40 years.  BRCA-related cancer risk assessment.** / For women who have family members with a BRCA-related cancer (breast, ovarian, tubal, or peritoneal cancers).  Mammogram.** / Every year beginning at age 40 years and continuing for as long as you are in good health. Consult with your health care provider.  Pap test.** / Every 3 years starting at age 30 years through age 65 or 70 years with a history of 3 consecutive normal Pap tests.  HPV screening.** / Every 3 years from ages 30 years through ages 65 to 70 years with a history of 3 consecutive normal Pap tests.  Fecal occult blood test (FOBT) of stool. / Every year beginning at age 50 years and continuing until age 75 years. You may not need to do this test if you get a colonoscopy every 10 years.  Flexible sigmoidoscopy or colonoscopy.** / Every 5 years for a flexible sigmoidoscopy or every 10 years for a colonoscopy beginning at age 50 years and continuing until age 75 years.  Hepatitis C blood test.** / For all people born from 1945 through  1965 and any individual with known risks for hepatitis C.  Skin self-exam. / Monthly.  Influenza vaccine. / Every year.  Tetanus, diphtheria, and acellular pertussis (Tdap/Td) vaccine.** / Consult your health care provider. Pregnant women should receive 1 dose of Tdap vaccine during each pregnancy. 1 dose of Td every 10 years.  Varicella vaccine.** / Consult your health care provider. Pregnant females who do not have evidence of immunity should receive the first dose after pregnancy.  Zoster vaccine.** / 1 dose for adults aged 60 years or older.  Measles, mumps, rubella (MMR) vaccine.** / You need at least 1 dose of MMR if you were born in 1957 or later. You may also need a 2nd dose. For females of childbearing age, rubella immunity should be determined. If there is no evidence of immunity, females who are not pregnant should be vaccinated. If there is no evidence of immunity, females who are pregnant should delay immunization until after pregnancy.  Pneumococcal 13-valent conjugate (PCV13) vaccine.** / Consult your health care provider.  Pneumococcal polysaccharide (PPSV23) vaccine.** / 1 to 2 doses if you smoke cigarettes or if you have certain conditions.  Meningococcal vaccine.** / Consult your health care provider.  Hepatitis A vaccine.** / Consult your health care provider.  Hepatitis B vaccine.** / Consult your health care provider.  Haemophilus influenzae type b (Hib) vaccine.** / Consult your health care provider. Ages 65   years and over  Blood pressure check.** / Every 1 to 2 years.  Lipid and cholesterol check.** / Every 5 years beginning at age 22 years.  Lung cancer screening. / Every year if you are aged 73-80 years and have a 30-pack-year history of smoking and currently smoke or have quit within the past 15 years. Yearly screening is stopped once you have quit smoking for at least 15 years or develop a health problem that would prevent you from having lung cancer  treatment.  Clinical breast exam.** / Every year after age 4 years.  BRCA-related cancer risk assessment.** / For women who have family members with a BRCA-related cancer (breast, ovarian, tubal, or peritoneal cancers).  Mammogram.** / Every year beginning at age 40 years and continuing for as long as you are in good health. Consult with your health care provider.  Pap test.** / Every 3 years starting at age 9 years through age 34 or 91 years with 3 consecutive normal Pap tests. Testing can be stopped between 65 and 70 years with 3 consecutive normal Pap tests and no abnormal Pap or HPV tests in the past 10 years.  HPV screening.** / Every 3 years from ages 57 years through ages 64 or 45 years with a history of 3 consecutive normal Pap tests. Testing can be stopped between 65 and 70 years with 3 consecutive normal Pap tests and no abnormal Pap or HPV tests in the past 10 years.  Fecal occult blood test (FOBT) of stool. / Every year beginning at age 15 years and continuing until age 17 years. You may not need to do this test if you get a colonoscopy every 10 years.  Flexible sigmoidoscopy or colonoscopy.** / Every 5 years for a flexible sigmoidoscopy or every 10 years for a colonoscopy beginning at age 86 years and continuing until age 71 years.  Hepatitis C blood test.** / For all people born from [birth year] through 1963 and any individual with known risks for hepatitis C.  Osteoporosis screening.** / A one-time screening for women ages 83 years and over and women at risk for fractures or osteoporosis.  Skin self-exam. / Monthly.  Influenza vaccine. / Every year.  Tetanus, diphtheria, and acellular pertussis (Tdap/Td) vaccine.** / 1 dose of Td every 10 years.  Varicella vaccine.** / Consult your health care provider.  Zoster vaccine.** / 1 dose for adults aged 61 years or older.  Pneumococcal 13-valent conjugate (PCV13) vaccine.** / Consult your health care provider.  Pneumococcal  polysaccharide (PPSV23) vaccine.** / 1 dose for all adults aged 28 years and older.  Meningococcal vaccine.** / Consult your health care provider.  Hepatitis A vaccine.** / Consult your health care provider.  Hepatitis B vaccine.** / Consult your health care provider.  Haemophilus influenzae type b (Hib) vaccine.** / Consult your health care provider. ** Family history and personal history of risk and conditions may change your health care provider's recommendations. Document Released: 04/07/2001 Document Revised: 06/26/2013 Document Reviewed: 07/07/2010 [facility] Patient Information 2015 [place], [place]. This information is not intended to replace advice given to you by your health care provider. Make sure you discuss any questions you have with your health care provider.

## 2014-02-02 NOTE — Addendum Note (Signed)
Addended by: Modena Morrow D on: 02/02/2014 04:30 PM   Modules accepted: Orders

## 2014-02-05 LAB — URINE CULTURE

## 2014-02-12 ENCOUNTER — Telehealth: Payer: Self-pay | Admitting: Family Medicine

## 2014-02-12 MED ORDER — CIPROFLOXACIN HCL 250 MG PO TABS: 250.0000 mg | ORAL_TABLET | Freq: Two times a day (BID) | ORAL | Status: DC

## 2014-02-12 NOTE — Telephone Encounter (Signed)
Urine culture neg because col < 100000 but if symptomatic---- cipro 250 mg 1 po bidx 3 days

## 2014-02-12 NOTE — Telephone Encounter (Signed)
Discussed with patient and she voiced understanding. Rx faxed    KP 

## 2014-02-12 NOTE — Telephone Encounter (Signed)
Caller name: Jahaira, Earnhart Relation to pt: self  Call back number: [phone]   Reason for call: pt inquiring about urine results

## 2014-02-15 ENCOUNTER — Ambulatory Visit (INDEPENDENT_AMBULATORY_CARE_PROVIDER_SITE_OTHER): Payer: Medicare Other | Admitting: Family Medicine

## 2014-02-15 ENCOUNTER — Ambulatory Visit (HOSPITAL_BASED_OUTPATIENT_CLINIC_OR_DEPARTMENT_OTHER)
Admission: RE | Admit: 2014-02-15 | Discharge: 2014-02-15 | Disposition: A | Discharge status: Home / Self Care | Payer: Medicare Other | Source: Ambulatory Visit | Attending: Family Medicine | Admitting: Family Medicine

## 2014-02-15 ENCOUNTER — Encounter: Payer: Self-pay | Admitting: Family Medicine

## 2014-02-15 VITALS — BP 142/96 | HR 95 | Temp 98.3°F | Ht 59.0 in | Wt 133.4 lb

## 2014-02-15 DIAGNOSIS — M150 Primary generalized (osteo)arthritis: Secondary | ICD-10-CM | POA: Diagnosis not present

## 2014-02-15 DIAGNOSIS — K219 Gastro-esophageal reflux disease without esophagitis: Secondary | ICD-10-CM

## 2014-02-15 DIAGNOSIS — S2242XA Multiple fractures of ribs, left side, initial encounter for closed fracture: Secondary | ICD-10-CM | POA: Insufficient documentation

## 2014-02-15 DIAGNOSIS — S2232XA Fracture of one rib, left side, initial encounter for closed fracture: Secondary | ICD-10-CM | POA: Diagnosis not present

## 2014-02-15 DIAGNOSIS — S299XXA Unspecified injury of thorax, initial encounter: Secondary | ICD-10-CM | POA: Diagnosis not present

## 2014-02-15 DIAGNOSIS — W19XXXA Unspecified fall, initial encounter: Secondary | ICD-10-CM | POA: Diagnosis not present

## 2014-02-15 DIAGNOSIS — R0789 Other chest pain: Secondary | ICD-10-CM

## 2014-02-15 DIAGNOSIS — I10 Essential (primary) hypertension: Secondary | ICD-10-CM

## 2014-02-15 DIAGNOSIS — R0781 Pleurodynia: Secondary | ICD-10-CM | POA: Diagnosis present

## 2014-02-15 DIAGNOSIS — M159 Polyosteoarthritis, unspecified: Secondary | ICD-10-CM

## 2014-02-15 MED ORDER — TRAMADOL HCL 50 MG PO TABS: 50.0000 mg | ORAL_TABLET | Freq: Four times a day (QID) | ORAL | Status: DC | PRN

## 2014-02-15 NOTE — Progress Notes (Signed)
Pre visit review using our clinic review tool, if applicable. No additional management support is needed unless otherwise documented below in the visit note. 

## 2014-02-15 NOTE — Patient Instructions (Addendum)
Apply Salon Pas patches twice daily Tylenol/ACetaminophen 500 mg tabs, 1-2 tbs up to 3 x a day  Ice 15 minutes  3 x a day    Costochondritis Costochondritis, sometimes called Tietze syndrome, is a swelling and irritation (inflammation) of the tissue (cartilage) that connects your ribs with your breastbone (sternum). It causes pain in the chest and rib area. Costochondritis usually goes away on its own over time. It can take up to 6 weeks or longer to get better, especially if you are unable to limit your activities. CAUSES  Some cases of costochondritis have no known cause. Possible causes include:  Injury (trauma).  Exercise or activity such as lifting.  Severe coughing. SIGNS AND SYMPTOMS  Pain and tenderness in the chest and rib area.  Pain that gets worse when coughing or taking deep breaths.  Pain that gets worse with specific movements. DIAGNOSIS  Your health care provider will do a physical exam and ask about your symptoms. Chest X-rays or other tests may be done to rule out other problems. TREATMENT  Costochondritis usually goes away on its own over time. Your health care provider may prescribe medicine to help relieve pain. HOME CARE INSTRUCTIONS   Avoid exhausting physical activity. Try not to strain your ribs during normal activity. This would include any activities using chest, abdominal, and side muscles, especially if heavy weights are used.  Apply ice to the affected area for the first 2 days after the pain begins.  Put ice in a plastic bag.  Place a towel between your skin and the bag.  Leave the ice on for 20 minutes, 2-3 times a day.  Only take over-the-counter or prescription medicines as directed by your health care provider. SEEK MEDICAL CARE IF:  You have redness or swelling at the rib joints. These are signs of infection.  Your pain does not go away despite rest or medicine. SEEK IMMEDIATE MEDICAL CARE IF:   Your pain increases or you are very  uncomfortable.  You have shortness of breath or difficulty breathing.  You cough up blood.  You have worse chest pains, sweating, or vomiting.  You have a fever or persistent symptoms for more than 2-3 days.  You have a fever and your symptoms suddenly get worse. MAKE SURE YOU:   Understand these instructions.  Will watch your condition.  Will get help right away if you are not doing well or get worse. Document Released: 11/19/2004 Document Revised: 11/30/2012 Document Reviewed: 09/13/2012 [facility] Patient Information 2015 [place], [place]. This information is not intended to replace advice given to you by your health care provider. Make sure you discuss any questions you have with your health care provider.

## 2014-02-15 NOTE — Progress Notes (Signed)
Mary Flynn  [id number] 12/30/1934 02/15/2014      Progress Note-Follow Up  Subjective  Chief Complaint  Chief Complaint  Patient presents with  . Fall    pt fell this am at 3:00am and hit (L) side on the tub    HPI  Patient is a 79 y.o. female in today for routine medical care. She is here today to follow-up on a fall. She fell overnight. Slipped in her bathroom and hit her left chest wall on the edge of her bathtub. She denies shortness of breath or palpitations. She denies syncope or head trauma but she does note persistent pain on the left side of her chest. Worse with movement and deep breathing.  Past Medical History  Diagnosis Date  . Diabetes mellitus   . Hyperlipidemia   . Hypertension     Past Surgical History  Procedure Laterality Date  . Appendectomy    . Surgical sterilization  40 years ago  . Refractive surgery      Left Eye    Family History  Problem Relation Age of Onset  . Depression    . Cancer      laryngeal  . Diabetes    . Diabetes Mother     History   Social History  . Marital Status: Married    Spouse Name: Cire Deyarmin    Number of Children: 2  . Years of Education: College   Occupational History  . housewife    Social History Main Topics  . Smoking status: Never Smoker   . Smokeless tobacco: Never Used  . Alcohol Use: No  . Drug Use: No  . Sexual Activity: Not Currently   Other Topics Concern  . Not on file   Social History Narrative   Patient lives at home with spouse.   Caffeine Use: rarely    Current Outpatient Prescriptions on File Prior to Visit  Medication Sig Dispense Refill  . aspirin EC 81 MG tablet Take 81 mg by mouth daily.    Marland Kitchen atorvastatin (LIPITOR) 10 MG tablet TAKE 1 TABLET BY MOUTH EVERY DAY 90 tablet 1  . b complex vitamins tablet Take 1 tablet by mouth daily.    . baclofen (LIORESAL) 20 MG tablet TAKE 1 TABLET BY MOUTH THREE TIMES DAILY 270 tablet 0  . cyclobenzaprine (FLEXERIL) 10 MG tablet Take  10 mg by mouth 3 (three) times daily as needed for muscle spasms.    Marland Kitchen gabapentin (NEURONTIN) 300 MG capsule Take 1 capsule (300 mg total) by mouth 3 (three) times daily. 270 capsule 1  . glimepiride (AMARYL) 4 MG tablet Take 1 tablet (4 mg total) by mouth 2 (two) times daily. 60 tablet 3  . hydrochlorothiazide (HYDRODIURIL) 25 MG tablet Take 1 tablet (25 mg total) by mouth daily. 30 tablet 6  . insulin aspart (NOVOLOG) 100 UNIT/ML injection Inject 6-8 Units into the skin daily.     . Insulin Detemir (LEVEMIR FLEXPEN [place]) Inject 14 Units into the skin at bedtime.     . metFORMIN (GLUCOPHAGE) 1000 MG tablet TAKE 1 TABLET BY MOUTH TWICE DAILY WITH MEALS-- LABS ARE DUE NOW 60 tablet 3  . Niacin CR 1000 MG TBCR 1 po bid 60 each 5  . pantoprazole (PROTONIX) 40 MG tablet TAKE 1 TABLET BY MOUTH ONCE DAILY 90 tablet 1  . traMADol (ULTRAM) 50 MG tablet Take 1 tablet at bedtime as needed 40 tablet 0  . valsartan (DIOVAN) 320 MG tablet Take 1 tablet (320  mg total) by mouth daily. 90 tablet 3   No current facility-administered medications on file prior to visit.    Allergies  Allergen Reactions  . Penicillins Hives  . Sulfa Antibiotics Hives  . Sulfonamide Derivatives     Review of Systems  Review of Systems  Constitutional: Negative for fever and malaise/fatigue.  HENT: Negative for congestion.   Eyes: Negative for discharge.  Respiratory: Negative for shortness of breath.   Cardiovascular: Positive for chest pain. Negative for palpitations and leg swelling.       Left lateral chest wall, pain with palp and movement.   Gastrointestinal: Negative for nausea, abdominal pain and diarrhea.  Genitourinary: Negative for dysuria.  Musculoskeletal: Negative for falls.  Skin: Negative for rash.  Neurological: Negative for loss of consciousness and headaches.  Endo/Heme/Allergies: Negative for polydipsia.  Psychiatric/Behavioral: Negative for depression and suicidal ideas. The patient is not  nervous/anxious and does not have insomnia.     Objective  BP 142/96 mmHg  Pulse 95  Temp(Src) 98.3 F (36.8 C) (Oral)  Ht 4\' 11"  (1.499 m)  Wt 133 lb 6.4 oz (60.51 kg)  BMI 26.93 kg/m2  SpO2 100%  Physical Exam  Physical Exam  Constitutional: She is oriented to person, place, and time and well-developed, well-nourished, and in no distress. No distress.  HENT:  Head: Normocephalic and atraumatic.  Eyes: Conjunctivae are normal.  Neck: Neck supple. No thyromegaly present.  Cardiovascular: Normal rate, regular rhythm and normal heart sounds.   No murmur heard. Pulmonary/Chest: Effort normal and breath sounds normal. She has no wheezes. She exhibits tenderness.  Left lateral chest wall pain with palp along 7th and 8th rib.   Abdominal: She exhibits no distension and no mass.  Musculoskeletal: She exhibits no edema.  Lymphadenopathy:    She has no cervical adenopathy.  Neurological: She is alert and oriented to person, place, and time.  Skin: Skin is warm and dry. No rash noted. She is not diaphoretic.  Psychiatric: Memory, affect and judgment normal.    Lab Results  Component Value Date   TSH 3.99 03/23/2013   Lab Results  Component Value Date   WBC 9.6 09/28/2013   HGB 12.1 09/28/2013   HCT 37.9 09/28/2013   MCV 84.4 09/28/2013   PLT 237.0 09/28/2013   Lab Results  Component Value Date   CREATININE 0.7 12/13/2013   BUN 11 12/13/2013   NA 137 12/13/2013   K 3.9 12/13/2013   CL 98 12/13/2013   CO2 30 12/13/2013   Lab Results  Component Value Date   ALT 19 12/13/2013   AST 26 12/13/2013   ALKPHOS 56 12/13/2013   BILITOT 1.4* 12/13/2013   Lab Results  Component Value Date   CHOL 114 06/08/2013   Lab Results  Component Value Date   HDL 56.60 06/08/2013   Lab Results  Component Value Date   LDLCALC 46 06/08/2013   Lab Results  Component Value Date   TRIG 58.0 06/08/2013   Lab Results  Component Value Date   CHOLHDL 2 06/08/2013      Assessment & Plan  Essential hypertension Mild elevation with acute pain from fall and rib fracture.   GERD Avoid offending foods, start probiotics. Do not eat large meals in late evening and consider raising head of bed. Will need to minimize NSAID use.   Left rib fracture Very subtle per xray but pain c/w fracture. Encouraged to take it easy, maintain deep breathing. Try Salon Pas and Tylenol given  Tramadol for severe pain

## 2014-02-18 ENCOUNTER — Encounter: Payer: Self-pay | Admitting: Family Medicine

## 2014-02-18 DIAGNOSIS — S2232XA Fracture of one rib, left side, initial encounter for closed fracture: Secondary | ICD-10-CM | POA: Insufficient documentation

## 2014-02-18 HISTORY — DX: Fracture of one rib, left side, initial encounter for closed fracture: S22.32XA

## 2014-02-18 NOTE — Assessment & Plan Note (Signed)
Very subtle per xray but pain c/w fracture. Encouraged to take it easy, maintain deep breathing. Try Salon Pas and Tylenol given Tramadol for severe pain

## 2014-02-18 NOTE — Assessment & Plan Note (Addendum)
Avoid offending foods, start probiotics. Do not eat large meals in late evening and consider raising head of bed. Will need to minimize NSAID use.

## 2014-02-18 NOTE — Assessment & Plan Note (Signed)
Mild elevation with acute pain from fall and rib fracture.

## 2014-02-19 ENCOUNTER — Ambulatory Visit (INDEPENDENT_AMBULATORY_CARE_PROVIDER_SITE_OTHER): Payer: Medicare Other | Admitting: Endocrinology

## 2014-02-19 ENCOUNTER — Encounter: Payer: Self-pay | Admitting: Endocrinology

## 2014-02-19 VITALS — BP 128/72 | HR 97 | Temp 98.0°F | Resp 14 | Ht 59.0 in | Wt 132.4 lb

## 2014-02-19 DIAGNOSIS — R269 Unspecified abnormalities of gait and mobility: Secondary | ICD-10-CM | POA: Diagnosis not present

## 2014-02-19 DIAGNOSIS — E1165 Type 2 diabetes mellitus with hyperglycemia: Secondary | ICD-10-CM | POA: Diagnosis not present

## 2014-02-19 DIAGNOSIS — IMO0002 Reserved for concepts with insufficient information to code with codable children: Secondary | ICD-10-CM

## 2014-02-19 NOTE — Progress Notes (Signed)
Patient ID: Mary Flynn, female   DOB: 11/09/1934, 79 y.o.   MRN: [id number]   Reason for Appointment: Diabetes follow-up   History of Present Illness   Diagnosis: Type 2 DIABETES MELITUS, date of diagnosis:   1995    Previous history: She has been on various regimens of hypoglycemic drugs over the last several years including Actos, Amaryl and Januvia. Because of cost her Januvia was stopped and Actos was stopped because of potential weight gain She was initially started on Levemir insulin because of fasting hyperglycemia in 2009 and the dose has been adjusted periodically In the last year she is also quite mealtime coverage because of postprandial hyperglycemia despite usually eating a low carbohydrate diet  Recent history:  She is here for two-month follow-up because of poor control on her last visit. She was advised to check blood sugars more consistently after meals and she has done a little better but checking blood sugars mostly after supper and sporadically after lunch Although she has fairly good blood sugars overall in the morning she has mostly high readings at night More recently has not been getting any protein at suppertime She claims that she gets low sugars during the night around midnight but her symptoms are nonspecific such as feeling hungry and nauseated but not shaky or sweaty. Also she is not checking blood sugars after breakfast even though she made cereal in the morning Blood glucose control is overall significantly worse with A1c nearly 9% She thinks that this is partly related to her stress level, intercurrent illnesses and difficulty getting her insulin dose adjusted correctly However she has started using a new glucose monitor recently and is checking readings only fasting This is despite her being reminded regularly to check consistently after meals She arbitrarily takes extra insulin when she is feeling her sugars going up but not adjusting it based on her meals  is Occasionally will forget her insulin when she is eating out  Hypoglycemia :  none recently except Has occasional symptoms of low sugars especially if she takes extra NovoLog     Oral hypoglycemic drugs:Amaryl, metformin  Side effects from medications: None Insulin regimen: Levemir 14 units ;  Novolog 6 ac lunch usually       Proper timing of medications in relation to meals: She is taking her NovoLog right after eating       Monitors blood glucose: Once a day.    Glucometer:Accucheck        Blood Glucose readings from meter download:  PRE-MEAL Breakfast  PC  Lunch Dinner  PCS  Overall  Glucose range:  88-159  102-157   155-258    Mean:  120     196  148   Meals: 3 meals per day. she is a vegetarian. Trying to get some protein at most meals including dairy products and tofu  Has lunch around 2 PM, dinner at 8 PM      Physical activity: exercise: walking 2 miles or going to the gym when she can           Last visit with dietitian: 2009   Wt Readings from Last 3 Encounters:  02/19/14 132 lb 6.4 oz (60.056 kg)  02/15/14 133 lb 6.4 oz (60.51 kg)  02/02/14 130 lb 6.4 oz (59.149 kg)    LABS:  Lab Results  Component Value Date   HGBA1C 8.7* 12/13/2013   HGBA1C 7.8* 06/08/2013   HGBA1C 7.9* 03/07/2013   Lab Results  Component Value Date  MICROALBUR 4.9* 06/08/2013   LDLCALC 46 06/08/2013   CREATININE 0.7 12/13/2013       Medication List       This list is accurate as of: 02/19/14  1:46 PM.  Always use your most recent med list.               aspirin EC 81 MG tablet  Take 81 mg by mouth daily.     atorvastatin 10 MG tablet  Commonly known as:  LIPITOR  TAKE 1 TABLET BY MOUTH EVERY DAY     b complex vitamins tablet  Take 1 tablet by mouth daily.     baclofen 20 MG tablet  Commonly known as:  LIORESAL  TAKE 1 TABLET BY MOUTH THREE TIMES DAILY     cyclobenzaprine 10 MG tablet  Commonly known as:  FLEXERIL  Take 10 mg by mouth 3 (three) times daily as needed  for muscle spasms.     gabapentin 300 MG capsule  Commonly known as:  NEURONTIN  Take 1 capsule (300 mg total) by mouth 3 (three) times daily.     glimepiride 4 MG tablet  Commonly known as:  AMARYL  Take 1 tablet (4 mg total) by mouth 2 (two) times daily.     hydrochlorothiazide 25 MG tablet  Commonly known as:  HYDRODIURIL  Take 1 tablet (25 mg total) by mouth daily.     insulin aspart 100 UNIT/ML injection  Commonly known as:  novoLOG  Inject 6 Units into the skin daily.     LEVEMIR FLEXPEN [place]  Inject 14 Units into the skin at bedtime.     metFORMIN 1000 MG tablet  Commonly known as:  GLUCOPHAGE  TAKE 1 TABLET BY MOUTH TWICE DAILY WITH MEALS-- LABS ARE DUE NOW     Niacin CR 1000 MG Tbcr  1 po bid     pantoprazole 40 MG tablet  Commonly known as:  PROTONIX  TAKE 1 TABLET BY MOUTH ONCE DAILY     traMADol 50 MG tablet  Commonly known as:  ULTRAM  Take 1 tablet (50 mg total) by mouth every 6 (six) hours as needed. Take 1 tablet at bedtime as needed     valsartan 320 MG tablet  Commonly known as:  DIOVAN  Take 1 tablet (320 mg total) by mouth daily.        Allergies:  Allergies  Allergen Reactions  . Penicillins Hives  . Sulfa Antibiotics Hives  . Sulfonamide Derivatives     Past Medical History  Diagnosis Date  . Diabetes mellitus   . Hyperlipidemia   . Hypertension   . Left rib fracture 02/18/2014    Past Surgical History  Procedure Laterality Date  . Appendectomy    . Surgical sterilization  40 years ago  . Refractive surgery      Left Eye    Family History  Problem Relation Age of Onset  . Depression    . Cancer      laryngeal  . Diabetes    . Diabetes Mother     Social History:  reports that she has never smoked. She has never used smokeless tobacco. She reports that she does not drink alcohol or use illicit drugs.  Review of Systems:  Hypertension:    Only on Diovan 320 mg. with good control  Lipids: Has had diabetic dyslipidemia,  usually well controlled  Lab Results  Component Value Date   CHOL 114 06/08/2013   HDL 56.60 06/08/2013  LDLCALC 46 06/08/2013   TRIG 58.0 06/08/2013   CHOLHDL 2 06/08/2013    Neuropathy: She previously had sharp pains in her lower legs but no numbness. These  were controlled with gabapentin Occasionally may have balance difficulties Diabetic foot exam done in 12/15  She has been regular with her eye exams     Examination:   BP 128/72 mmHg  Pulse 97  Temp(Src) 98 F (36.7 C)  Resp 14  Ht 4\' 11"  (1.499 m)  Wt 132 lb 6.4 oz (60.056 kg)  BMI 26.73 kg/m2  SpO2 96%  Body mass index is 26.73 kg/(m^2).   Diabetic foot exam shows normal monofilament sensation in the toes and plantar surfaces, no skin lesions or ulcers on the feet and normal pedal pulses   ASSESSMENT/ PLAN:   Diabetes type 2   Blood glucose control is fair with some tendency to high postprandial readings especially after supper Although this may be mostly related to lack of protein at suppertime she may need mealtime coverage at this time also She is reluctant to do this at this time because of fear of hypoglycemia She will start adding protein at suppertime and discussed blood sugar targets in the evening after supper  Most likely will do better with taking Levemir in the morning so that she can take suppertime coverage also Discussed adjustment of Levemir based on fasting blood sugar and would consider increasing the doses or changing to Lantus if fasting readings are higher She will try to consistently take 14 units for now  Will need urine microalbumin on the next visit  Hypertension: Blood pressure is well controlled   Balance difficulties: She will discuss this with PCP, does not appear to be from neuropathy  Patient Instructions  Check some sugars after breakfast also and if over 180 start 3 Novolog in am also  3 Novolog before dinner but first try adding protein to dinner meal  Change Levemir  to breakfast time    Counseling time over 50% of today's 25 minute visit  Marrianne Sica 02/19/2014, 1:46 PM

## 2014-02-19 NOTE — Patient Instructions (Addendum)
Check some sugars after breakfast also and if over 180 start 3 Novolog in am also  3 Novolog before dinner but first try adding protein to dinner meal  Change Levemir to breakfast time

## 2014-02-20 ENCOUNTER — Telehealth: Payer: Self-pay | Admitting: Endocrinology

## 2014-02-20 NOTE — Telephone Encounter (Signed)
Pt calling to ask some questions about what was discussed yesterday. She is not clear on somethings

## 2014-02-20 NOTE — Telephone Encounter (Signed)
Patient is concerned since you told her to take her levemir at breakfast, she said her sugar was higher when she did it that way.  She said for lunch she took 3 units of novolog. She's not sure she understood your directions.  She's concerned that if she doesn't take the levemir at night her sugars will be higher. She wants to talk to you about her sugars and insulin.

## 2014-02-21 NOTE — Telephone Encounter (Signed)
Am sugar 69 but she took 6 units of NovoLog at supper instead of 3 units and none at lunchtime The day before it was 158 at breakfast but did not check after dinner She will try to continue Levemir 14 in the morning but take 4 units of NovoLog at supper along with 6 at lunchtime

## 2014-02-26 ENCOUNTER — Telehealth: Payer: Self-pay

## 2014-02-26 NOTE — Telephone Encounter (Signed)
Patient presents to the lobby stating that she is not completely better from her non-displaced rib fracture. States that the pain is better than it was but that she is still having some pain. Patient states that she has not been using the [facility] because she had a hard time removing it from her skin. She also is not taken Tylenol as directed but did take some Aleve yesterday. When ask if she was resting and taken the tramadol she stated that she was trying to rest. States that she has not been able to exercise, etc. Advised patient that she should be resting so that the bone would have time to rejuvenate. Reviewed instructions given at her office visit. Advised that if the pain has not decreased by using the recommendations given to let our office know. Patient verbalizes understanding.

## 2014-02-27 ENCOUNTER — Telehealth: Payer: Self-pay | Admitting: *Deleted

## 2014-02-27 DIAGNOSIS — M150 Primary generalized (osteo)arthritis: Secondary | ICD-10-CM

## 2014-02-27 DIAGNOSIS — E1165 Type 2 diabetes mellitus with hyperglycemia: Principal | ICD-10-CM

## 2014-02-27 DIAGNOSIS — IMO0002 Reserved for concepts with insufficient information to code with codable children: Secondary | ICD-10-CM

## 2014-02-27 DIAGNOSIS — E1149 Type 2 diabetes mellitus with other diabetic neurological complication: Secondary | ICD-10-CM

## 2014-02-27 DIAGNOSIS — M159 Polyosteoarthritis, unspecified: Secondary | ICD-10-CM

## 2014-02-27 NOTE — Telephone Encounter (Signed)
Caller name: Emiah Relation to pt: self Call back number: [phone] Pharmacy: [facility]  Reason for call: Pt requesting refills for 90 day supply be sent to mail order for the following:  atorvastatin (LIPITOR) 10 MG tablet  gabapentin (NEURONTIN) 300 MG capsule glimepiride (AMARYL) 4 MG tablet  hydrochlorothiazide (HYDRODIURIL) 25 MG tablet metFORMIN (GLUCOPHAGE) 1000 MG tablet valsartan (DIOVAN) 320 MG tablet   Pt request Kim to call, they have new insurance and mail order. Insurance in pt chart.

## 2014-02-27 NOTE — Telephone Encounter (Signed)
Spoke with patient and she stated she dd not need any med's sent at this time, she is trying to figure out the cost before she wants me to send it to [facility], if it is too expensive she will just have it sent down stairs.     KP

## 2014-02-28 ENCOUNTER — Telehealth: Payer: Self-pay | Admitting: *Deleted

## 2014-02-28 MED ORDER — METFORMIN HCL 1000 MG PO TABS: ORAL_TABLET | ORAL | Status: DC

## 2014-02-28 MED ORDER — ONETOUCH ULTRA BLUE VI STRP: ORAL_STRIP | Status: DC

## 2014-02-28 MED ORDER — VALSARTAN 320 MG PO TABS: 320.0000 mg | ORAL_TABLET | Freq: Every day | ORAL | Status: DC

## 2014-02-28 MED ORDER — GABAPENTIN 300 MG PO CAPS: 300.0000 mg | ORAL_CAPSULE | Freq: Three times a day (TID) | ORAL | Status: DC

## 2014-02-28 MED ORDER — TRAMADOL HCL 50 MG PO TABS: 50.0000 mg | ORAL_TABLET | Freq: Four times a day (QID) | ORAL | Status: DC | PRN

## 2014-02-28 MED ORDER — GLIMEPIRIDE 4 MG PO TABS
4.0000 mg | ORAL_TABLET | Freq: Two times a day (BID) | ORAL | Status: DC
Start: 2014-02-28 — End: 2014-07-26

## 2014-02-28 MED ORDER — HYDROCHLOROTHIAZIDE 25 MG PO TABS: 25.0000 mg | ORAL_TABLET | Freq: Every day | ORAL | Status: DC

## 2014-02-28 MED ORDER — ATORVASTATIN CALCIUM 10 MG PO TABS: ORAL_TABLET | ORAL | Status: DC

## 2014-02-28 NOTE — Telephone Encounter (Signed)
Medications have been faxed to [facility] per patient request.      KP

## 2014-02-28 NOTE — Telephone Encounter (Signed)
ERROR

## 2014-02-28 NOTE — Addendum Note (Signed)
Addended by: Ewing Schlein on: 02/28/2014 11:06 AM   Modules accepted: Orders

## 2014-02-28 NOTE — Telephone Encounter (Signed)
Pt came by this morning. The information for [facility] is:  [phone], [phone] [place] [address]  She is requesting to get the following refills now (for 90 day supply) in addition to the ones requested below in phone note:  traMADol (ULTRAM) 50 MG tablet  One Touch Ultra 2 strips (comes in #50 per box, request 4 boxes)  Pt also request all medications to have refills.

## 2014-03-01 ENCOUNTER — Telehealth: Payer: Self-pay | Admitting: Endocrinology

## 2014-03-01 ENCOUNTER — Telehealth: Payer: Self-pay | Admitting: Family Medicine

## 2014-03-01 NOTE — Telephone Encounter (Signed)
Patient would like to speak with Rhonda  ° °Thank you  °

## 2014-03-01 NOTE — Telephone Encounter (Addendum)
Pt would like to speak with CMA "Maudie Mercury" specifically regarding all medication and mail order.

## 2014-03-01 NOTE — Telephone Encounter (Signed)
She should wait 2 hours after eating to check her blood sugar

## 2014-03-01 NOTE — Telephone Encounter (Signed)
Tried calling the patient twice and the number is temporarily unavailable at this time.     KP

## 2014-03-01 NOTE — Telephone Encounter (Signed)
Patient called today, she said when she woke up this morning her fasting blood sugar was 95, she said she ate a piece of toast with a little cheese about 4 grams of protein and took 14 units of Levemir when she checked her sugar about an hour after it went up to 191, she said she thought maybe her finger was dirty and that caused it to me high, she said she washed her finger immediately and rechecked it and it was 184.    Patient is confused about what's going on.  Please advise.

## 2014-03-05 NOTE — Telephone Encounter (Signed)
Patient stated she got confirmation and her medication should be there tomorrow.       KP

## 2014-03-06 ENCOUNTER — Telehealth: Payer: Self-pay | Admitting: *Deleted

## 2014-03-06 NOTE — Telephone Encounter (Signed)
She can continue Levemir at bedtime

## 2014-03-06 NOTE — Telephone Encounter (Signed)
Patient called, she said her fasting sugars in the morning have been 137. 130, she said she ate a piece of toast with a little bit of peanut butter, checked her sugar 2 hours later and it was 162, she said when she took her levemir at night and novolog in the morning her sugars were better, she wants to know if she can go back to taking it that way.  Please advise.

## 2014-03-06 NOTE — Telephone Encounter (Signed)
Noted, patient is aware. 

## 2014-03-08 ENCOUNTER — Telehealth: Payer: Self-pay | Admitting: *Deleted

## 2014-03-08 NOTE — Telephone Encounter (Signed)
Caller name: Anella Relation to pt: self Call back number: [phone] Pharmacy: [facility]  Reason for call: Pt states [facility] has tried to contact our office by phone and fax regarding the traMADol (ULTRAM) 50 MG tablet dosage, but has not heard back from [place].  Pt also wants to make sure the prescription is for 90 day supply.

## 2014-03-08 NOTE — Telephone Encounter (Signed)
Mary Flynn-from [facility]  Fax: [phone]  Needs DEA, name, and NPI needs to be legible. Mary Flynn states that nothing was legible on tramadol refill.  Please resend.

## 2014-03-08 NOTE — Telephone Encounter (Signed)
Rx re-sent   KP 

## 2014-03-13 ENCOUNTER — Telehealth: Payer: Self-pay | Admitting: Family Medicine

## 2014-03-13 DIAGNOSIS — M150 Primary generalized (osteo)arthritis: Principal | ICD-10-CM

## 2014-03-13 DIAGNOSIS — M159 Polyosteoarthritis, unspecified: Secondary | ICD-10-CM

## 2014-03-13 MED ORDER — TRAMADOL HCL 50 MG PO TABS
50.0000 mg | ORAL_TABLET | Freq: Four times a day (QID) | ORAL | Status: DC | PRN
Start: 2014-03-13 — End: 2014-04-09

## 2014-03-13 NOTE — Telephone Encounter (Signed)
Rx faxed to [facility].      KP

## 2014-03-13 NOTE — Telephone Encounter (Signed)
I called the pharmacy and cancelled the Tramadol. Is it ok to send to he [facility] pharmacy. Please advise     KP

## 2014-03-13 NOTE — Telephone Encounter (Signed)
[facility]

## 2014-03-13 NOTE — Telephone Encounter (Signed)
Caller name: Adonna Relation to pt: Call back number: [phone] Pharmacy: [facility]  Reason for call: Pt came in office requesting rx traMADol (ULTRAM) 50 MG tablet it was sent to [facility] but pt states canceled it yesterday and wants this med sent to the pharmacy here at [facility]. Please advise, thank you.

## 2014-03-15 ENCOUNTER — Telehealth: Payer: Self-pay | Admitting: Family Medicine

## 2014-03-15 MED ORDER — ONETOUCH ULTRA BLUE VI STRP: ORAL_STRIP | Status: DC

## 2014-03-15 NOTE — Telephone Encounter (Signed)
Caller name: Rosalba Relation to pt: self Call back number: [phone] Pharmacy:  Reason for call:   [facility] states that [facility] told her that they only sent her 100 test strips. Patient thought that 200 test strips were refilled

## 2014-03-15 NOTE — Telephone Encounter (Signed)
Patient stated she has a broken rib and wanted to know if she could get an increase on the quantity of her Tramadol, she she said Dr.Blyth advised to take 4 per day, but she has been taking 2 to so she will not run out. She just wanted to make sure you where aware.      KP

## 2014-03-20 ENCOUNTER — Telehealth: Payer: Self-pay | Admitting: Family Medicine

## 2014-03-20 MED ORDER — ONETOUCH ULTRA BLUE VI STRP: ORAL_STRIP | Status: DC

## 2014-03-20 NOTE — Telephone Encounter (Signed)
Rx faxed.    KP 

## 2014-03-20 NOTE — Telephone Encounter (Signed)
Caller name:[facility] Ref # [id number] Relationship to patient:Pharmacy Can be reached:[phone] Fax [phone] Pharmacy:[facility]  Reason for call:Need to change directions on one touch ultra test strip/pt states that they test 2 times daily and order only states once daily. Please advise.

## 2014-03-26 ENCOUNTER — Other Ambulatory Visit: Payer: Self-pay | Admitting: Endocrinology

## 2014-04-04 ENCOUNTER — Telehealth: Payer: Self-pay | Admitting: Endocrinology

## 2014-04-04 NOTE — Telephone Encounter (Signed)
Patient need a prescription for a One Touch ultra 2 fax to  [facility] [phone]

## 2014-04-05 ENCOUNTER — Other Ambulatory Visit: Payer: Self-pay | Admitting: *Deleted

## 2014-04-05 MED ORDER — ONETOUCH ULTRA BLUE VI STRP: ORAL_STRIP | Status: DC

## 2014-04-09 ENCOUNTER — Other Ambulatory Visit: Payer: Self-pay | Admitting: Family Medicine

## 2014-04-09 NOTE — Telephone Encounter (Signed)
Last seen 02/02/14 and filled 03/13/14 #60   Please advise    KP

## 2014-04-12 MED ORDER — GLUCOSE BLOOD VI STRP: ORAL_STRIP | Status: DC

## 2014-04-12 NOTE — Telephone Encounter (Signed)
Pt needs rx for the one touch meter ultra 2

## 2014-04-13 ENCOUNTER — Telehealth: Payer: Self-pay | Admitting: Family Medicine

## 2014-04-13 NOTE — Telephone Encounter (Signed)
Caller name: Federica Relation to pt: Call back number:  Pharmacy:  Reason for call:  Pt is here stating that saw Dr Charlett Blake in Dec (fell in the bathroom) and had x-rays done, pt states that Dr Charlett Blake recommended to let us know how she is doing. Pt mention is feeling much better and is wanting to have an x-ray just to see if everything is okay. Pt is wanting to have x-ray done for today, since already here. Please advise

## 2014-04-15 NOTE — Telephone Encounter (Signed)
If she is feeling better she really does not need an xray, it is just radiation that will not benefit . If she still has any pain would could repeat xray. If she is having new issues we should probably see her.

## 2014-04-16 NOTE — Telephone Encounter (Signed)
She can start exercising just start out slow and stop if any pain recurs

## 2014-04-16 NOTE — Telephone Encounter (Signed)
Called the patient did inform of PCP instructions.  She is having no pain and would like to resume exercising and rolling on a ball on her stomach... Please advise if ok to do.

## 2014-04-17 ENCOUNTER — Telehealth: Payer: Self-pay | Admitting: Endocrinology

## 2014-04-17 MED ORDER — ONETOUCH ULTRA 2 W/DEVICE KIT: PACK | Status: DC

## 2014-04-17 NOTE — Telephone Encounter (Signed)
Done

## 2014-04-17 NOTE — Telephone Encounter (Signed)
It is likely to be fine for her to sleep on left side, if it causes any pain she should stop and let us know

## 2014-04-17 NOTE — Telephone Encounter (Signed)
Dr. Frederik Pear recommendation discussed with patient. Pt stated understanding.  She also had a question about sleeping on her left side.  She said she was told by a provider (she did not say who) that she was not to sleep on her left side.  She says that now she's fine and wants to know if it's okay to sleep on her left side now?    Please advise.

## 2014-04-17 NOTE — Telephone Encounter (Signed)
Left a message for call back.  

## 2014-04-17 NOTE — Telephone Encounter (Signed)
Pt needs the rx sent to [facility] for a new meter. The ultra one touch 2 F# [phone]

## 2014-04-18 NOTE — Telephone Encounter (Signed)
Called the patient informed of PCP instructions.  The patient did verbalize understanding of instructions.

## 2014-04-30 ENCOUNTER — Telehealth: Payer: Self-pay | Admitting: Endocrinology

## 2014-04-30 NOTE — Telephone Encounter (Signed)
Patient would like for you to please call her  ° ° °Thank You  °

## 2014-05-07 ENCOUNTER — Ambulatory Visit: Payer: Medicare Other | Admitting: Endocrinology

## 2014-05-11 ENCOUNTER — Ambulatory Visit (INDEPENDENT_AMBULATORY_CARE_PROVIDER_SITE_OTHER): Payer: Medicare Other | Admitting: Family Medicine

## 2014-05-11 ENCOUNTER — Encounter: Payer: Self-pay | Admitting: Family Medicine

## 2014-05-11 VITALS — BP 120/70 | HR 108 | Temp 97.7°F | Wt 125.0 lb

## 2014-05-11 DIAGNOSIS — E119 Type 2 diabetes mellitus without complications: Secondary | ICD-10-CM

## 2014-05-11 DIAGNOSIS — M179 Osteoarthritis of knee, unspecified: Secondary | ICD-10-CM | POA: Diagnosis not present

## 2014-05-11 DIAGNOSIS — M1710 Unilateral primary osteoarthritis, unspecified knee: Secondary | ICD-10-CM

## 2014-05-11 DIAGNOSIS — IMO0002 Reserved for concepts with insufficient information to code with codable children: Secondary | ICD-10-CM

## 2014-05-11 DIAGNOSIS — N390 Urinary tract infection, site not specified: Secondary | ICD-10-CM | POA: Diagnosis not present

## 2014-05-11 DIAGNOSIS — E785 Hyperlipidemia, unspecified: Secondary | ICD-10-CM

## 2014-05-11 LAB — POCT URINALYSIS DIPSTICK
Bilirubin, UA: NEGATIVE
Blood, UA: NEGATIVE
KETONES UA: NEGATIVE
Nitrite, UA: NEGATIVE
PH UA: 6
Protein, UA: NEGATIVE
SPEC GRAV UA: 1.02
Urobilinogen, UA: 2

## 2014-05-11 MED ORDER — TRAMADOL HCL 50 MG PO TABS: 50.0000 mg | ORAL_TABLET | Freq: Four times a day (QID) | ORAL | Status: DC | PRN

## 2014-05-11 MED ORDER — INSULIN ASPART 100 UNIT/ML ~~LOC~~ SOLN: 6.0000 [IU] | Freq: Every day | SUBCUTANEOUS | Status: DC

## 2014-05-11 MED ORDER — CIPROFLOXACIN HCL 250 MG PO TABS: 250.0000 mg | ORAL_TABLET | Freq: Two times a day (BID) | ORAL | Status: DC

## 2014-05-11 MED ORDER — INSULIN DETEMIR 100 UNIT/ML FLEXPEN: 14.0000 [IU] | PEN_INJECTOR | Freq: Every day | SUBCUTANEOUS | Status: DC

## 2014-05-11 NOTE — Progress Notes (Signed)
Pre visit review using our clinic review tool, if applicable. No additional management support is needed unless otherwise documented below in the visit note. 

## 2014-05-11 NOTE — Patient Instructions (Signed)

## 2014-05-11 NOTE — Progress Notes (Signed)
  Subjective:    Mary Flynn is a 80 y.o. female who complains of abnormal smelling urine and frequency. She has had symptoms for 2 days. Patient also complains of nothing else. Patient denies back pain, congestion, cough, fever, headache, rhinitis, sorethroat, stomach ache and vaginal discharge. Patient does have a history of recurrent UTI. Patient does not have a history of pyelonephritis.  She is also f/u with dm, hyperlipidemia, htn.    The following portions of the patient's history were reviewed and updated as appropriate: allergies, current medications, past family history, past medical history, past social history, past surgical history and problem list.  Review of Systems Pertinent items are noted in HPI.    Objective:    BP 120/70 mmHg  Pulse 108  Temp(Src) 97.7 F (36.5 C) (Oral)  Wt 125 lb (56.7 kg)  SpO2 97% General appearance: alert, cooperative, appears stated age and no distress Lungs: clear to auscultation bilaterally Heart: S1, S2 normal Abdomen: soft, non-tender; bowel sounds normal; no masses,  no organomegaly  Laboratory:  Urine dipstick: negative for glucose, negative for hemoglobin, negative for ketones, 2+ for leukocyte esterase, negative for nitrites, negative for protein and negative for urobilinogen.   Micro exam: not done.    Assessment:    dysuria     Plan:    Maintain adequate hydration.    1. Urinary tract infection without hematuria, site unspecified  - POCT Urinalysis Dipstick - Urine Culture - ciprofloxacin (CIPRO) 250 MG tablet; Take 1 tablet (250 mg total) by mouth 2 (two) times daily.  Dispense: 20 tablet; Refill: 0  2. Osteoarthrosis, unspecified whether generalized or localized, involving lower leg  - traMADol (ULTRAM) 50 MG tablet; Take 1 tablet (50 mg total) by mouth every 6 (six) hours as needed.  Dispense: 180 tablet; Refill: 1  3. Diabetes mellitus type II, controlled Per endo - insulin aspart (NOVOLOG) 100 UNIT/ML injection;  Inject 6 Units into the skin daily.  Dispense: 10 mL; Refill: 2 - Insulin Detemir (LEVEMIR FLEXPEN) 100 UNIT/ML Pen; Inject 14 Units into the skin at bedtime.  Dispense: 15 mL; Refill: 11 - Basic metabolic panel; Future - Microalbumin / creatinine urine ratio; Future - POCT urinalysis dipstick; Future  4. Hyperlipidemia LDL goal <70  - Hemoglobin A1c; Future - Hepatic function panel; Future - Lipid panel; Future - Microalbumin / creatinine urine ratio; Future - POCT urinalysis dipstick; Future

## 2014-05-14 ENCOUNTER — Other Ambulatory Visit (INDEPENDENT_AMBULATORY_CARE_PROVIDER_SITE_OTHER): Payer: Medicare Other

## 2014-05-14 DIAGNOSIS — E785 Hyperlipidemia, unspecified: Secondary | ICD-10-CM | POA: Diagnosis not present

## 2014-05-14 DIAGNOSIS — E119 Type 2 diabetes mellitus without complications: Secondary | ICD-10-CM

## 2014-05-14 DIAGNOSIS — E1140 Type 2 diabetes mellitus with diabetic neuropathy, unspecified: Secondary | ICD-10-CM | POA: Diagnosis not present

## 2014-05-14 LAB — HEPATIC FUNCTION PANEL
ALBUMIN: 4.1 g/dL (ref 3.5–5.2)
ALK PHOS: 58 U/L (ref 39–117)
ALT: 17 U/L (ref 0–35)
AST: 21 U/L (ref 0–37)
Bilirubin, Direct: 0.2 mg/dL (ref 0.0–0.3)
TOTAL PROTEIN: 6.6 g/dL (ref 6.0–8.3)
Total Bilirubin: 1 mg/dL (ref 0.2–1.2)

## 2014-05-14 LAB — BASIC METABOLIC PANEL
BUN: 11 mg/dL (ref 6–23)
CALCIUM: 9.3 mg/dL (ref 8.4–10.5)
CHLORIDE: 97 meq/L (ref 96–112)
CO2: 31 meq/L (ref 19–32)
CREATININE: 0.58 mg/dL (ref 0.40–1.20)
GFR: 106.98 mL/min (ref >60.00)
GLUCOSE: 131 mg/dL — AB (ref 70–99)
Potassium: 3.8 mEq/L (ref 3.5–5.1)
Sodium: 135 mEq/L (ref 135–145)

## 2014-05-14 LAB — LIPID PANEL
CHOL/HDL RATIO: 2
CHOLESTEROL: 108 mg/dL (ref 0–200)
HDL: 47.6 mg/dL (ref >39.00)
LDL CALC: 43 mg/dL (ref 0–99)
NonHDL: 60.4
Triglycerides: 89 mg/dL (ref 0.0–149.0)
VLDL: 17.8 mg/dL (ref 0.0–40.0)

## 2014-05-14 LAB — MICROALBUMIN / CREATININE URINE RATIO
Creatinine,U: 102.7 mg/dL
Microalb Creat Ratio: 3.5 mg/g (ref 0.0–30.0)
Microalb, Ur: 3.6 mg/dL — ABNORMAL HIGH (ref 0.0–1.9)

## 2014-05-14 LAB — HEMOGLOBIN A1C: HEMOGLOBIN A1C: 8.4 % — AB (ref 4.6–6.5)

## 2014-05-14 LAB — URINE CULTURE: Colony Count: >=100000

## 2014-05-15 ENCOUNTER — Other Ambulatory Visit: Payer: Self-pay | Admitting: *Deleted

## 2014-05-15 ENCOUNTER — Other Ambulatory Visit: Payer: Medicare Other

## 2014-05-15 DIAGNOSIS — E119 Type 2 diabetes mellitus without complications: Secondary | ICD-10-CM

## 2014-05-15 MED ORDER — INSULIN LISPRO 100 UNIT/ML ~~LOC~~ SOLN: SUBCUTANEOUS | Status: DC

## 2014-05-15 MED ORDER — INSULIN DETEMIR 100 UNIT/ML FLEXPEN: 14.0000 [IU] | PEN_INJECTOR | Freq: Every day | SUBCUTANEOUS | Status: DC

## 2014-05-22 ENCOUNTER — Other Ambulatory Visit (INDEPENDENT_AMBULATORY_CARE_PROVIDER_SITE_OTHER): Payer: Medicare Other

## 2014-05-22 DIAGNOSIS — IMO0002 Reserved for concepts with insufficient information to code with codable children: Secondary | ICD-10-CM

## 2014-05-22 DIAGNOSIS — E1165 Type 2 diabetes mellitus with hyperglycemia: Secondary | ICD-10-CM

## 2014-05-22 DIAGNOSIS — Z1231 Encounter for screening mammogram for malignant neoplasm of breast: Secondary | ICD-10-CM | POA: Diagnosis not present

## 2014-05-22 LAB — URINALYSIS, ROUTINE W REFLEX MICROSCOPIC
Bilirubin Urine: NEGATIVE
Hgb urine dipstick: NEGATIVE
Ketones, ur: NEGATIVE
LEUKOCYTES UA: NEGATIVE
NITRITE: NEGATIVE
PH: 7.5 (ref 5.0–8.0)
Specific Gravity, Urine: 1.015 (ref 1.000–1.030)
Total Protein, Urine: NEGATIVE
Urine Glucose: NEGATIVE
Urobilinogen, UA: 0.2 (ref 0.0–1.0)

## 2014-05-22 LAB — HEMOGLOBIN A1C: Hgb A1c MFr Bld: 8.4 % — ABNORMAL HIGH (ref 4.6–6.5)

## 2014-05-22 LAB — COMPREHENSIVE METABOLIC PANEL
ALBUMIN: 4.3 g/dL (ref 3.5–5.2)
ALT: 20 U/L (ref 0–35)
AST: 19 U/L (ref 0–37)
Alkaline Phosphatase: 61 U/L (ref 39–117)
BUN: 12 mg/dL (ref 6–23)
CALCIUM: 9.7 mg/dL (ref 8.4–10.5)
CHLORIDE: 95 meq/L — AB (ref 96–112)
CO2: 30 meq/L (ref 19–32)
Creatinine, Ser: 0.65 mg/dL (ref 0.40–1.20)
GFR: 93.79 mL/min (ref >60.00)
GLUCOSE: 132 mg/dL — AB (ref 70–99)
Potassium: 3.7 mEq/L (ref 3.5–5.1)
SODIUM: 134 meq/L — AB (ref 135–145)
Total Bilirubin: 1.1 mg/dL (ref 0.2–1.2)
Total Protein: 7.1 g/dL (ref 6.0–8.3)

## 2014-05-22 LAB — MICROALBUMIN / CREATININE URINE RATIO
CREATININE, U: 102.9 mg/dL
MICROALB/CREAT RATIO: 7 mg/g (ref 0.0–30.0)
Microalb, Ur: 7.2 mg/dL — ABNORMAL HIGH (ref 0.0–1.9)

## 2014-05-25 ENCOUNTER — Telehealth: Payer: Self-pay | Admitting: Endocrinology

## 2014-05-25 ENCOUNTER — Ambulatory Visit (INDEPENDENT_AMBULATORY_CARE_PROVIDER_SITE_OTHER): Payer: Medicare Other | Admitting: Endocrinology

## 2014-05-25 ENCOUNTER — Encounter: Payer: Self-pay | Admitting: Endocrinology

## 2014-05-25 VITALS — BP 144/92 | HR 101 | Temp 98.1°F | Resp 14 | Ht 59.0 in | Wt 132.0 lb

## 2014-05-25 DIAGNOSIS — I10 Essential (primary) hypertension: Secondary | ICD-10-CM | POA: Diagnosis not present

## 2014-05-25 DIAGNOSIS — E1165 Type 2 diabetes mellitus with hyperglycemia: Secondary | ICD-10-CM | POA: Diagnosis not present

## 2014-05-25 DIAGNOSIS — IMO0002 Reserved for concepts with insufficient information to code with codable children: Secondary | ICD-10-CM

## 2014-05-25 NOTE — Patient Instructions (Addendum)
Novolog 3 units before breakfast but 6 for cereal; Take 6 at LUNCH before lunch and 3-4 at dinner  Novolog should be adjusted to keep sugar <180 after meals  Please check blood sugars at least half the time about 2 hours after any meal and 3 times per week on waking up.  Please bring blood sugar monitor to each visit.  Recommended blood sugar levels about 2 hours after meal is 140-180 and on waking up 90-130  Take Gabapentin only if have nerve pain

## 2014-05-25 NOTE — Telephone Encounter (Signed)
Pt is asking for Suanne Marker to call her regarding meters

## 2014-05-25 NOTE — Progress Notes (Signed)
Patient ID: Mary Flynn, female   DOB: 28-Jan-1935, 80 y.o.   MRN: [id number]   Reason for Appointment: Diabetes follow-up   History of Present Illness   Diagnosis: Type 2 DIABETES MELITUS, date of diagnosis:   1995    Previous history: She has been on various regimens of hypoglycemic drugs over the last several years including Actos, Amaryl and Januvia. Because of cost her Januvia was stopped and Actos was stopped because of potential weight gain She was initially started on Levemir insulin because of fasting hyperglycemia in 2009 and the dose has been adjusted periodically In the last year she is also quite mealtime coverage because of postprandial hyperglycemia despite usually eating a low carbohydrate diet  Recent history:  Her A1c continues to be persistently over 8% She is consistently not following instructions to check blood sugars after meals to help adjust her mealtime coverage and is only checking fasting readings except on a couple of days. She was also told to try Levemir in the morning instead of bedtime to help daytime hyperglycemia but she prefers to take it at night Despite her trying to be good with her diet and keeping small portions she is still having high postprandial readings, usually around 200 or more after breakfast or lunch She will not be getting consistent amount of protein at each meal especially breakfast when she will eat bran cereal FASTING blood sugars are reasonably good with average about 114 but she has a couple of readings in the 60s also She is generally trying to be active Not clear if she is benefiting much from Amaryl at this time, previously would get hypoglycemia with this also Hypoglycemia :  none recently mildly low sugars waking up     Oral hypoglycemic drugs: Amaryl, metformin  Side effects from medications: None Insulin regimen: Levemir 14 units hs;  Novolog 6 ac lunch usually       Proper timing of medications in relation to meals: She is taking  her NovoLog right after eating       Monitors blood glucose: Once a day.    Glucometer:Accucheck        Blood Glucose readings from meter download:  PRE-MEAL Breakfast Lunch  5 PM  Bedtime Overall  Glucose range:  62-203   190, 289   226     Average:  116      135     Meals: 3 meals per day. she is a vegetarian. Trying to get some protein at most meals including dairy products and tofu  Has Bfst 10 am,  lunch around 2 PM, dinner at 8 PM      Physical activity: exercise: walking 2 miles or going to the gym when she can           Last visit with dietitian: 2009   Wt Readings from Last 3 Encounters:  05/25/14 132 lb (59.875 kg)  05/11/14 125 lb (56.7 kg)  02/19/14 132 lb 6.4 oz (60.056 kg)    LABS:  Lab Results  Component Value Date   HGBA1C 8.4* 05/22/2014   HGBA1C 8.4* 05/14/2014   HGBA1C 8.7* 12/13/2013   Lab Results  Component Value Date   MICROALBUR 7.2* 05/22/2014   LDLCALC 43 05/14/2014   CREATININE 0.65 05/22/2014       Medication List       This list is accurate as of: 05/25/14 11:59 PM.  Always use your most recent med list.  aspirin EC 81 MG tablet  Take 81 mg by mouth daily.     atorvastatin 10 MG tablet  Commonly known as:  LIPITOR  TAKE 1 TABLET BY MOUTH EVERY DAY     b complex vitamins tablet  Take 1 tablet by mouth daily.     baclofen 20 MG tablet  Commonly known as:  LIORESAL  TAKE 1 TABLET BY MOUTH THREE TIMES DAILY     ciprofloxacin 250 MG tablet  Commonly known as:  CIPRO  Take 1 tablet (250 mg total) by mouth 2 (two) times daily.     cyclobenzaprine 10 MG tablet  Commonly known as:  FLEXERIL  Take 10 mg by mouth 3 (three) times daily as needed for muscle spasms.     gabapentin 300 MG capsule  Commonly known as:  NEURONTIN  Take 1 capsule (300 mg total) by mouth 3 (three) times daily.     glimepiride 4 MG tablet  Commonly known as:  AMARYL  Take 1 tablet (4 mg total) by mouth 2 (two) times daily.      hydrochlorothiazide 25 MG tablet  Commonly known as:  HYDRODIURIL  Take 1 tablet (25 mg total) by mouth daily.     insulin aspart 100 UNIT/ML injection  Commonly known as:  novoLOG  Inject 6 Units into the skin daily.     Insulin Detemir 100 UNIT/ML Pen  Commonly known as:  LEVEMIR FLEXPEN  Inject 14 Units into the skin at bedtime.     insulin lispro 100 UNIT/ML injection  Commonly known as:  HUMALOG  Inject 6 units daily     metFORMIN 1000 MG tablet  Commonly known as:  GLUCOPHAGE  TAKE 1 TABLET BY MOUTH TWICE DAILY WITH MEALS     metFORMIN 1000 MG tablet  Commonly known as:  GLUCOPHAGE  TAKE 1 TABLET BY MOUTH TWICE DAILY WITH MEALS-- LABS ARE DUE NOW     Niacin CR 1000 MG Tbcr  1 po bid     ONE TOUCH ULTRA 2 W/DEVICE Kit  Use to test blood sugar 2 times daily as instructed. Dx code: E11.49     ONE TOUCH ULTRA TEST test strip  Generic drug:  glucose blood  Check blood sugar twice daily     glucose blood test strip  Commonly known as:  ONE TOUCH ULTRA TEST  Use to test blood sugar 2 times daily as instructed. Dx code: E11.49     pantoprazole 40 MG tablet  Commonly known as:  PROTONIX  TAKE 1 TABLET BY MOUTH ONCE DAILY     traMADol 50 MG tablet  Commonly known as:  ULTRAM  Take 1 tablet (50 mg total) by mouth every 6 (six) hours as needed.     valsartan 320 MG tablet  Commonly known as:  DIOVAN  Take 1 tablet (320 mg total) by mouth daily.        Allergies:  Allergies  Allergen Reactions  . Penicillins Hives  . Sulfa Antibiotics Hives  . Sulfonamide Derivatives     Past Medical History  Diagnosis Date  . Diabetes mellitus   . Hyperlipidemia   . Hypertension   . Left rib fracture 02/18/2014    Past Surgical History  Procedure Laterality Date  . Appendectomy    . Surgical sterilization  40 years ago  . Refractive surgery      Left Eye    Family History  Problem Relation Age of Onset  . Depression    . Cancer  laryngeal  . Diabetes     . Diabetes Mother     Social History:  reports that she has never smoked. She has never used smokeless tobacco. She reports that she does not drink alcohol or use illicit drugs.  Review of Systems:  Hypertension:    Only on Diovan 320 mg. with relatively high blood pressure is in the office He does not monitor at home regularly and blood pressure is followed by PCP  Lipids: Has had diabetic dyslipidemia,  well controlled and HDL is also fairly good  Lab Results  Component Value Date   CHOL 108 05/14/2014   HDL 47.60 05/14/2014   LDLCALC 43 05/14/2014   TRIG 89.0 05/14/2014   CHOLHDL 2 05/14/2014    Neuropathy: She previously had sharp pains in her lower legs but no numbness.  She does not have any symptoms and not clear why she continues to take gabapentin; she did not understand the need for using this only as needed at this time  Occasionally may have balance difficulties Diabetic foot exam done in 12/15 Diabetic foot exam showed normal monofilament sensation in the toes and plantar surfaces, no skin lesions or ulcers on the feet and normal pedal pulses  She has been regular with her eye exams    Examination:   BP 144/92 mmHg  Pulse 101  Temp(Src) 98.1 F (36.7 C)  Resp 14  Ht $R'4\' 11"'Za$  (1.499 m)  Wt 132 lb (59.875 kg)  BMI 26.65 kg/m2  SpO2 97%  Body mass index is 26.65 kg/(m^2).   No pedal edema  ASSESSMENT/ PLAN:   Diabetes type 2   Blood glucose control is relatively poor with A1c consistently over 8% and postprandial hyperglycemia More recently has not checked readings after meals as discussed previously and not clear how often she is having hyperglycemia However considering that her A1c is significantly high along with nearly normal fasting reading she will need to consider mealtime coverage with all meals Also not having a tendency to hypoglycemia during the day Breakfast: She is still eating bran cereal and advised her to modify this and add protein  along with less carbohydrate She also needs to be able to adjust her mealtime dose based on portion size but also titrate the dose to keep postprandial readings below at least 180 This was discussed in detail today  For now she can start taking at least 3-4 units for breakfast and supper and continue her 6 units for lunch; may need more at lunch if glucose still is going up after her 2 PM meal Will avoid overnight hypoglycemia will reduce her Levemir to 12 units for now  Hypertension: Blood pressure is followed by PCP, relatively high reading today  History of neuropathy: Reassured her that gabapentin is not necessary to be taken at this time and to take only having specific neuropathic type pain in her lower legs   Patient Instructions  Novolog 3 units before breakfast but 6 for cereal; Take 6 at LUNCH before lunch and 3-4 at dinner  Novolog should be adjusted to keep sugar <180 after meals  Please check blood sugars at least half the time about 2 hours after any meal and 3 times per week on waking up.  Please bring blood sugar monitor to each visit.  Recommended blood sugar levels about 2 hours after meal is 140-180 and on waking up 90-130  Take Gabapentin only if have nerve pain   Counseling time over 50% of today's 25 minute  visit  Mary Flynn 05/27/2014, 1:57 PM

## 2014-07-03 ENCOUNTER — Encounter: Payer: Self-pay | Admitting: Family Medicine

## 2014-07-24 ENCOUNTER — Telehealth: Payer: Self-pay | Admitting: Internal Medicine

## 2014-07-24 NOTE — Telephone Encounter (Signed)
Patient notified.  No questions or concerns at this time. Nothing further needed.   

## 2014-07-24 NOTE — Telephone Encounter (Signed)
Unless having side effects should stay on gabentin (for cough, not nerve pain in the usual dm indication) until returns for ov - could reduce dose to bid in meantime to see if lower dose works as well

## 2014-07-24 NOTE — Telephone Encounter (Signed)
Patient is taking Gabapentin.  Patient says that she no longer has cough, patient thinks she is good now.  Endocrinologist wants to know why patient is taking Gabapentin and wants her to stop Gabapentin.  Endocrinologist told patient that she does not have neuro-pain and doesn't need the Gabapentin. Patient says that she wants Dr. Melvyn Novas to decide if she should stop the Gabapentin. She wants to know if it is okay to stop Gabapentin?  Patient is currently in [place] tending to sick daughter and cannot come in to be seen for several months.    To Dr. Melvyn Novas, please advise.

## 2014-07-26 ENCOUNTER — Other Ambulatory Visit: Payer: Self-pay | Admitting: Family Medicine

## 2014-09-05 ENCOUNTER — Other Ambulatory Visit: Payer: Self-pay | Admitting: Endocrinology

## 2014-09-25 ENCOUNTER — Other Ambulatory Visit (INDEPENDENT_AMBULATORY_CARE_PROVIDER_SITE_OTHER): Payer: Medicare Other

## 2014-09-25 DIAGNOSIS — E1165 Type 2 diabetes mellitus with hyperglycemia: Secondary | ICD-10-CM

## 2014-09-25 DIAGNOSIS — IMO0002 Reserved for concepts with insufficient information to code with codable children: Secondary | ICD-10-CM

## 2014-09-25 LAB — COMPREHENSIVE METABOLIC PANEL
ALT: 21 U/L (ref 0–35)
AST: 22 U/L (ref 0–37)
Albumin: 4.3 g/dL (ref 3.5–5.2)
Alkaline Phosphatase: 59 U/L (ref 39–117)
BUN: 12 mg/dL (ref 6–23)
CO2: 30 meq/L (ref 19–32)
Calcium: 9.5 mg/dL (ref 8.4–10.5)
Chloride: 89 mEq/L — ABNORMAL LOW (ref 96–112)
Creatinine, Ser: 0.66 mg/dL (ref 0.40–1.20)
GFR: 92.07 mL/min (ref >60.00)
Glucose, Bld: 177 mg/dL — ABNORMAL HIGH (ref 70–99)
Potassium: 3.7 mEq/L (ref 3.5–5.1)
SODIUM: 129 meq/L — AB (ref 135–145)
Total Bilirubin: 1.2 mg/dL (ref 0.2–1.2)
Total Protein: 6.9 g/dL (ref 6.0–8.3)

## 2014-09-25 LAB — HEMOGLOBIN A1C: Hgb A1c MFr Bld: 9.1 % — ABNORMAL HIGH (ref 4.6–6.5)

## 2014-09-27 ENCOUNTER — Telehealth: Payer: Self-pay | Admitting: Endocrinology

## 2014-09-27 ENCOUNTER — Ambulatory Visit: Payer: Medicare Other | Admitting: Endocrinology

## 2014-09-27 NOTE — Telephone Encounter (Signed)
Patient no showed today's appt. Please advise on how to follow up. °A. No follow up necessary. °B. Follow up urgent. Contact patient immediately. °C. Follow up necessary. Contact patient and schedule visit in ___ days. °D. Follow up advised. Contact patient and schedule visit in ____weeks. ° °

## 2014-09-28 ENCOUNTER — Ambulatory Visit: Payer: Medicare Other | Admitting: Endocrinology

## 2014-09-28 ENCOUNTER — Encounter: Payer: Self-pay | Admitting: Endocrinology

## 2014-09-28 ENCOUNTER — Ambulatory Visit (INDEPENDENT_AMBULATORY_CARE_PROVIDER_SITE_OTHER): Payer: Medicare Other | Admitting: Endocrinology

## 2014-09-28 ENCOUNTER — Telehealth: Payer: Self-pay | Admitting: Family Medicine

## 2014-09-28 VITALS — BP 126/84 | HR 100 | Temp 98.2°F | Resp 14 | Ht 59.0 in | Wt 135.0 lb

## 2014-09-28 DIAGNOSIS — IMO0002 Reserved for concepts with insufficient information to code with codable children: Secondary | ICD-10-CM

## 2014-09-28 DIAGNOSIS — E1165 Type 2 diabetes mellitus with hyperglycemia: Secondary | ICD-10-CM

## 2014-09-28 DIAGNOSIS — E871 Hypo-osmolality and hyponatremia: Secondary | ICD-10-CM | POA: Diagnosis not present

## 2014-09-28 NOTE — Patient Instructions (Signed)
Take HCTZ 1/2 tab daily  Morning NOVOLOG 3-4 units depending on diet and sugar level  Lunch 6 units for usual meals Novolog 4 at dinner daily  TAKE NOVOLOG JUST BEFORE EATING  Must check sugars 2 hrs after meals

## 2014-09-28 NOTE — Telephone Encounter (Signed)
Spoke with patient and she verbalized understanding and has agreed to discontinue the medication. The apt is scheduled for 10/16/14 at 2:45.    KP

## 2014-09-28 NOTE — Telephone Encounter (Signed)
Ok to stop hctz--- bp check in 2-3 weeks

## 2014-09-28 NOTE — Telephone Encounter (Signed)
Relation to pt: self  Call back number: [phone]  Pharmacy:  Reason for call:  Patient stated her sodium level went down and would like to discuss medication

## 2014-09-28 NOTE — Telephone Encounter (Signed)
Dr.Kumar and the patient is concerned because the patient's sodium is dropping low, she was told it was due to the HCTZ and Dr.Kumar advised her to take half, but she does not know what she is taking the medication and she wanted to know if she can stop it. Please advise     KP

## 2014-09-28 NOTE — Progress Notes (Signed)
Patient ID: Mary Flynn, female   DOB: [birth year], 80 y.o.   MRN: [id number]   Reason for Appointment: Diabetes follow-up   History of Present Illness   Diagnosis: Type 2 DIABETES MELITUS, date of diagnosis:   1995    Previous history: She has been on various regimens of hypoglycemic drugs over the last several years including Actos, Amaryl and Januvia. Because of cost her Januvia was stopped and Actos was stopped because of potential weight gain She was initially started on Levemir insulin because of fasting hyperglycemia in 2009 and the dose has been adjusted periodically In the last year she is also quite mealtime coverage because of postprandial hyperglycemia despite usually eating a low carbohydrate diet  Recent history:  Her A1c continues to be persistently over 8% and now over 9% She is consistently not following instructions to check blood sugars after meals to help adjust her mealtime coverage and is only checking fasting readings except on a couple of days.  Current blood sugar patterns and problems identified:  FASTING blood sugars are overall higher than the last time although not consistently and not clear why some days it is much higher  She has some high readings midday and afternoon but only once was over 200 which she thinks is from what she ate  No readings after meals usually  Despite instructions to take insulin for every meal she is taking mealtime coverage only at lunchtime  She has only one good reading of 77 in the early afternoon  She may take 4 units of Novolog in the morning if her blood sugar is significantly high but not otherwise  She has not taken any insulin for her evening meal partly for fear of hypoglycemia at night She is generally trying to be active Not clear if she is benefiting much from Amaryl at this time, previously would get hypoglycemia with this also Hypoglycemia :  none recently     Oral hypoglycemic drugs: Amaryl, metformin   Side effects from medications: None Insulin regimen: Levemir 16 units hs;  Novolog 6 ac lunch usually       Proper timing of medications in relation to meals: She is taking her NovoLog right after eating       Monitors blood glucose: Once a day.    Glucometer:         Blood Glucose readings from meter download:   Mean values apply above for all meters except median for One Touch  PRE-MEAL Fasting Lunch Dinner Bedtime Overall  Glucose range: 101-203  77, 173   135, 261     Mean/median:  158      163    Meals: 3 meals per day. she is a vegetarian. Trying to get some protein at most meals including dairy products and tofu  Has Bfst 10 am,  lunch around 2 PM, dinner at 8 PM      Physical activity: exercise: walking 2 miles for 2 weeks          Last visit with dietitian: 2009   Wt Readings from Last 3 Encounters:  09/28/14 135 lb (61.236 kg)  05/25/14 132 lb (59.875 kg)  05/11/14 125 lb (56.7 kg)    LABS:  Lab Results  Component Value Date   HGBA1C 9.1* 09/25/2014   HGBA1C 8.4* 05/22/2014   HGBA1C 8.4* 05/14/2014   Lab Results  Component Value Date   MICROALBUR 7.2* 05/22/2014   LDLCALC 43 05/14/2014   CREATININE 0.66 09/25/2014  Medication List       This list is accurate as of: 09/28/14  1:10 PM.  Always use your most recent med list.               aspirin EC 81 MG tablet  Take 81 mg by mouth daily.     atorvastatin 10 MG tablet  Commonly known as:  LIPITOR  Take 1 tablet by mouth  every day     b complex vitamins tablet  Take 1 tablet by mouth daily.     baclofen 20 MG tablet  Commonly known as:  LIORESAL  TAKE 1 TABLET BY MOUTH THREE TIMES DAILY     ciprofloxacin 250 MG tablet  Commonly known as:  CIPRO  Take 1 tablet (250 mg total) by mouth 2 (two) times daily.     cyclobenzaprine 10 MG tablet  Commonly known as:  FLEXERIL  Take 10 mg by mouth 3 (three) times daily as needed for muscle spasms.     gabapentin 300 MG capsule  Commonly known  as:  NEURONTIN  Take 1 capsule by mouth 3  times daily     glimepiride 4 MG tablet  Commonly known as:  AMARYL  Take 1 tablet by mouth two  times daily     hydrochlorothiazide 25 MG tablet  Commonly known as:  HYDRODIURIL  Take 1 tablet by mouth  daily     insulin aspart 100 UNIT/ML injection  Commonly known as:  novoLOG  Inject 6 Units into the skin daily.     Insulin Detemir 100 UNIT/ML Pen  Commonly known as:  LEVEMIR FLEXPEN  Inject 14 Units into the skin at bedtime.     insulin lispro 100 UNIT/ML injection  Commonly known as:  HUMALOG  Inject 6 units daily     metFORMIN 1000 MG tablet  Commonly known as:  GLUCOPHAGE  Take 1 tablet by mouth  twice a day with meals     Niacin CR 1000 MG Tbcr  1 po bid     ONE TOUCH ULTRA 2 W/DEVICE Kit  Use to test blood sugar 2 times daily as instructed. Dx code: E11.49     ONE TOUCH ULTRA TEST test strip  Generic drug:  glucose blood  Check blood sugar twice daily     glucose blood test strip  Commonly known as:  ONE TOUCH ULTRA TEST  Use to test blood sugar 2 times daily as instructed. Dx code: E11.49     pantoprazole 40 MG tablet  Commonly known as:  PROTONIX  TAKE 1 TABLET BY MOUTH ONCE DAILY     traMADol 50 MG tablet  Commonly known as:  ULTRAM  Take 1 tablet (50 mg total) by mouth every 6 (six) hours as needed.     valsartan 320 MG tablet  Commonly known as:  DIOVAN  Take 1 tablet (320 mg total) by mouth daily.        Allergies:  Allergies  Allergen Reactions  . Penicillins Hives  . Sulfa Antibiotics Hives  . Sulfonamide Derivatives     Past Medical History  Diagnosis Date  . Diabetes mellitus   . Hyperlipidemia   . Hypertension   . Left rib fracture 02/18/2014    Past Surgical History  Procedure Laterality Date  . Appendectomy    . Surgical sterilization  40 years ago  . Refractive surgery      Left Eye    Family History  Problem Relation Age of Onset  .  Depression    . Cancer       laryngeal  . Diabetes    . Diabetes Mother     Social History:  reports that she has never smoked. She has never used smokeless tobacco. She reports that she does not drink alcohol or use illicit drugs.  Review of Systems:  Hypertension:    On HCTZ and Diovan 320 mg. with relatively high blood pressure  in the office She does not monitor at home regularly and blood pressure is followed by PCP  HYPONATREMIA: Sodium is much slower than usual at 129.  She tends to drink water periodically during the day because of dry mouth  Lipids: Has had diabetic dyslipidemia,  well controlled and HDL is also fairly good  Lab Results  Component Value Date   CHOL 108 05/14/2014   HDL 47.60 05/14/2014   LDLCALC 43 05/14/2014   TRIG 89.0 05/14/2014   CHOLHDL 2 05/14/2014    Neuropathy: She previously had sharp pains in her lower legs but no numbness.  She does not have any symptoms and not clear why she continues to take gabapentin; she apparently was told that this is for cough  Diabetic foot exam done in 12/15 Diabetic foot exam showed normal monofilament sensation in the toes and plantar surfaces, no skin lesions or ulcers on the feet and normal pedal pulses  She has been regular with her eye exams    Examination:   BP 126/84 mmHg  Pulse 100  Temp(Src) 98.2 F (36.8 C)  Resp 14  Ht _0  (1.499 m)  Wt 135 lb (61.236 kg)  BMI 27.25 kg/m2  SpO2 95%  Body mass index is 27.25 kg/(m^2).   No pedal edema  ASSESSMENT/ PLAN:   Diabetes type 2   Blood glucose control is relatively poor with A1c now over 9% Despite repeated instructions on managing her insulin and glucose monitoring she has not changed her daily management See history of present illness for detailed discussion of his current management, blood sugar patterns and problems identified She has fluctuating readings in the mornings and this may be dependent on level of hyperglycemia after her evening meal at night Difficult  to identify postprandial blood sugar patterns that she is apparently taking are blood sugars nonfasting and only sporadically in the afternoon and evening She thinks some of her high readings are related to increasing carbohydrates such as noodles and fruit but she does not understand need for covering all meals with insulin Currently taking Novolog mostly at lunchtime and only in the morning if blood sugar is high  Again discussed in detail the need for basal bolus insulin regimen to cover both fasting and  postprandial hyperglycemia She will need to take some amount of insulin with every meal based on her carbohydrate intake and some increase with eating foods like food as well as for high readings before the meal Doses as outlined in instructions below, she will continue the higher dose of Humalog at lunchtime Consider changing her Levemir to twice a day or morning only if suppertime readings are higher  Hypertension: Blood pressure is followed by PCP, relatively high reading today  HYPONATREMIA: This is likely to be from mild SIADH related to taking HCTZ; she is also not able to restrict water intake because of her dry mouth She will take only half a tablet of HCTZ for now and consider switching to Aldactone if needed   Patient Instructions  Take HCTZ 1/2 tab daily  Morning  NOVOLOG 3-4 units depending on diet and sugar level  Lunch 6 units for usual meals Novolog 4 at dinner daily  TAKE NOVOLOG JUST BEFORE EATING  Must check sugars 2 hrs after meals   Counseling time on subjects discussed above is over 50% of today's 25 minute visit   Mary Flynn 09/28/2014, 1:10 PM

## 2014-10-01 ENCOUNTER — Telehealth: Payer: Self-pay | Admitting: Endocrinology

## 2014-10-01 ENCOUNTER — Other Ambulatory Visit: Payer: Self-pay | Admitting: *Deleted

## 2014-10-01 NOTE — Telephone Encounter (Signed)
Please call pt back about Niastin

## 2014-10-01 NOTE — Telephone Encounter (Signed)
Patient called about her Niacin her insurance will no longer cover this. She said she called [facility] who said they could get it OTC but it would be 500 mg cr.  She wants to know how she should take it as she was on the 100 mg cr before.  Please advise

## 2014-10-01 NOTE — Telephone Encounter (Signed)
Needs to be 500mg  CR

## 2014-10-02 ENCOUNTER — Ambulatory Visit: Payer: Medicare Other | Admitting: Endocrinology

## 2014-10-02 NOTE — Telephone Encounter (Signed)
Noted, patient is aware. 

## 2014-10-16 ENCOUNTER — Encounter: Payer: Self-pay | Admitting: Family Medicine

## 2014-10-16 ENCOUNTER — Ambulatory Visit (INDEPENDENT_AMBULATORY_CARE_PROVIDER_SITE_OTHER): Payer: Medicare Other | Admitting: Family Medicine

## 2014-10-16 ENCOUNTER — Telehealth: Payer: Self-pay | Admitting: Family Medicine

## 2014-10-16 VITALS — BP 140/78 | HR 100 | Temp 98.2°F | Ht 59.0 in | Wt 136.0 lb

## 2014-10-16 DIAGNOSIS — E039 Hypothyroidism, unspecified: Secondary | ICD-10-CM

## 2014-10-16 DIAGNOSIS — E1151 Type 2 diabetes mellitus with diabetic peripheral angiopathy without gangrene: Secondary | ICD-10-CM

## 2014-10-16 DIAGNOSIS — E1159 Type 2 diabetes mellitus with other circulatory complications: Secondary | ICD-10-CM

## 2014-10-16 DIAGNOSIS — IMO0002 Reserved for concepts with insufficient information to code with codable children: Secondary | ICD-10-CM

## 2014-10-16 DIAGNOSIS — E1165 Type 2 diabetes mellitus with hyperglycemia: Secondary | ICD-10-CM | POA: Diagnosis not present

## 2014-10-16 DIAGNOSIS — I10 Essential (primary) hypertension: Secondary | ICD-10-CM | POA: Diagnosis not present

## 2014-10-16 DIAGNOSIS — E871 Hypo-osmolality and hyponatremia: Secondary | ICD-10-CM | POA: Diagnosis not present

## 2014-10-16 DIAGNOSIS — G629 Polyneuropathy, unspecified: Secondary | ICD-10-CM

## 2014-10-16 DIAGNOSIS — E1142 Type 2 diabetes mellitus with diabetic polyneuropathy: Secondary | ICD-10-CM

## 2014-10-16 NOTE — Telephone Encounter (Signed)
TSH has not been done since 02/2013. Please advise    KP

## 2014-10-16 NOTE — Telephone Encounter (Signed)
We have not checked it this year-----dx hypothyroid----tsh, free t3, free t4

## 2014-10-16 NOTE — Assessment & Plan Note (Signed)
Per endo °

## 2014-10-16 NOTE — Telephone Encounter (Signed)
°  Relation to PT:YYPE Call back [place]:  Reason for call: pt would like for you to give her a call, states her sister is a doctor and she called her and wants to know if dr. Etter Sjogren has checked her thyroid. Pt wants to know if not should she come in and do the test

## 2014-10-16 NOTE — Assessment & Plan Note (Signed)
Stable off hctz Recheck bmp

## 2014-10-16 NOTE — Patient Instructions (Signed)

## 2014-10-16 NOTE — Progress Notes (Signed)
Pre visit review using our clinic review tool, if applicable. No additional management support is needed unless otherwise documented below in the visit note. 

## 2014-10-16 NOTE — Progress Notes (Signed)
Patient ID: Mary Flynn, female   DOB: 1935/01/05, 80 y.o.   MRN: [id number]   Subjective:    Patient ID: Mary Flynn, female    DOB: [birth year], 80 y.o.   MRN: [id number]  Chief Complaint  Patient presents with  . Hypertension    BP check since stopping HCTZ  . Toe Pain    c/o burning sensation in the toes x's 3 days    HPI Patient is in today for f/u bp since stopping hctz.  She is also c/o numbness in toes-- she is taking gabepentin.  She is very frustrated because her glucose is becoming harder to control.    Past Medical History  Diagnosis Date  . Diabetes mellitus   . Hyperlipidemia   . Hypertension   . Left rib fracture 02/18/2014    Past Surgical History  Procedure Laterality Date  . Appendectomy    . Surgical sterilization  40 years ago  . Refractive surgery      Left Eye    Family History  Problem Relation Age of Onset  . Depression    . Cancer      laryngeal  . Diabetes    . Diabetes Mother     Social History   Social History  . Marital Status: Married    Spouse Name: Melea Prezioso  . Number of Children: 2  . Years of Education: College   Occupational History  . housewife    Social History Main Topics  . Smoking status: Never Smoker   . Smokeless tobacco: Never Used  . Alcohol Use: No  . Drug Use: No  . Sexual Activity: Not Currently   Other Topics Concern  . Not on file   Social History Narrative   Patient lives at home with spouse.   Caffeine Use: rarely    Outpatient Prescriptions Prior to Visit  Medication Sig Dispense Refill  . aspirin EC 81 MG tablet Take 81 mg by mouth daily.    Marland Kitchen atorvastatin (LIPITOR) 10 MG tablet Take 1 tablet by mouth  every day 90 tablet 1  . b complex vitamins tablet Take 1 tablet by mouth daily.    . baclofen (LIORESAL) 20 MG tablet TAKE 1 TABLET BY MOUTH THREE TIMES DAILY 270 tablet 0  . Blood Glucose Monitoring Suppl (ONE TOUCH ULTRA 2) W/DEVICE KIT Use to test blood sugar 2 times daily as instructed. Dx  code: E11.49 1 each 0  . cyclobenzaprine (FLEXERIL) 10 MG tablet Take 10 mg by mouth 3 (three) times daily as needed for muscle spasms.    Marland Kitchen gabapentin (NEURONTIN) 300 MG capsule Take 1 capsule by mouth 3  times daily 270 capsule 1  . glimepiride (AMARYL) 4 MG tablet Take 1 tablet by mouth two  times daily 180 tablet 1  . glucose blood (ONE TOUCH ULTRA TEST) test strip Use to test blood sugar 2 times daily as instructed. Dx code: E11.49 200 each 3  . insulin aspart (NOVOLOG) 100 UNIT/ML injection Inject 6 Units into the skin daily. 10 mL 2  . Insulin Detemir (LEVEMIR FLEXPEN) 100 UNIT/ML Pen Inject 14 Units into the skin at bedtime. 15 mL 3  . insulin lispro (HUMALOG) 100 UNIT/ML injection Inject 6 units daily 10 mL 3  . metFORMIN (GLUCOPHAGE) 1000 MG tablet Take 1 tablet by mouth  twice a day with meals 180 tablet 1  . Niacin CR 1000 MG TBCR 1 po bid 60 each 5  . ONE TOUCH ULTRA TEST test strip  Check blood sugar twice daily 200 each 3  . pantoprazole (PROTONIX) 40 MG tablet TAKE 1 TABLET BY MOUTH ONCE DAILY 90 tablet 1  . traMADol (ULTRAM) 50 MG tablet Take 1 tablet (50 mg total) by mouth every 6 (six) hours as needed. 180 tablet 1  . valsartan (DIOVAN) 320 MG tablet Take 1 tablet (320 mg total) by mouth daily. 90 tablet 3  . ciprofloxacin (CIPRO) 250 MG tablet Take 1 tablet (250 mg total) by mouth 2 (two) times daily. 20 tablet 0  . hydrochlorothiazide (HYDRODIURIL) 25 MG tablet Take 1 tablet by mouth  daily (Patient not taking: Reported on 10/16/2014) 90 tablet 1   No facility-administered medications prior to visit.    Allergies  Allergen Reactions  . Penicillins Hives  . Sulfa Antibiotics Hives  . Sulfonamide Derivatives     Review of Systems  Constitutional: Negative for fever and malaise/fatigue.  HENT: Negative for congestion.   Eyes: Negative for discharge.  Respiratory: Negative for shortness of breath.   Cardiovascular: Negative for chest pain, palpitations and leg swelling.   Gastrointestinal: Negative for nausea and abdominal pain.  Genitourinary: Negative for dysuria.  Musculoskeletal: Negative for falls.  Skin: Negative for rash.  Neurological: Negative for loss of consciousness and headaches.  Endo/Heme/Allergies: Negative for environmental allergies.  Psychiatric/Behavioral: Negative for depression. The patient is not nervous/anxious.        Objective:    Physical Exam  Constitutional: She is oriented to person, place, and time. She appears well-developed and well-nourished.  HENT:  Head: Normocephalic and atraumatic.  Eyes: Conjunctivae and EOM are normal.  Neck: Normal range of motion. Neck supple. No JVD present. Carotid bruit is not present. No thyromegaly present.  Cardiovascular: Normal rate, regular rhythm and normal heart sounds.   No murmur heard. Pulmonary/Chest: Effort normal and breath sounds normal. No respiratory distress. She has no wheezes. She has no rales. She exhibits no tenderness.  Musculoskeletal: She exhibits no edema.  Neurological: She is alert and oriented to person, place, and time.  Psychiatric: She has a normal mood and affect. Her behavior is normal. Thought content normal.    BP 140/78 mmHg  Pulse 100  Temp(Src) 98.2 F (36.8 C) (Oral)  Ht $R'4\' 11"'Wb$  (1.499 m)  Wt 136 lb (61.689 kg)  BMI 27.45 kg/m2  SpO2 97% Wt Readings from Last 3 Encounters:  10/16/14 136 lb (61.689 kg)  09/28/14 135 lb (61.236 kg)  05/25/14 132 lb (59.875 kg)     Lab Results  Component Value Date   WBC 9.6 09/28/2013   HGB 12.1 09/28/2013   HCT 37.9 09/28/2013   PLT 237.0 09/28/2013   GLUCOSE 177* 09/25/2014   CHOL 108 05/14/2014   TRIG 89.0 05/14/2014   HDL 47.60 05/14/2014   LDLCALC 43 05/14/2014   ALT 21 09/25/2014   AST 22 09/25/2014   NA 129* 09/25/2014   K 3.7 09/25/2014   CL 89* 09/25/2014   CREATININE 0.66 09/25/2014   BUN 12 09/25/2014   CO2 30 09/25/2014   TSH 3.99 03/23/2013   HGBA1C 9.1* 09/25/2014   MICROALBUR  7.2* 05/22/2014    Lab Results  Component Value Date   TSH 3.99 03/23/2013   Lab Results  Component Value Date   WBC 9.6 09/28/2013   HGB 12.1 09/28/2013   HCT 37.9 09/28/2013   MCV 84.4 09/28/2013   PLT 237.0 09/28/2013   Lab Results  Component Value Date   NA 129* 09/25/2014   K 3.7 09/25/2014  CO2 30 09/25/2014   GLUCOSE 177* 09/25/2014   BUN 12 09/25/2014   CREATININE 0.66 09/25/2014   BILITOT 1.2 09/25/2014   ALKPHOS 59 09/25/2014   AST 22 09/25/2014   ALT 21 09/25/2014   PROT 6.9 09/25/2014   ALBUMIN 4.3 09/25/2014   CALCIUM 9.5 09/25/2014   ANIONGAP 8 06/02/2011   GFR 92.07 09/25/2014   Lab Results  Component Value Date   CHOL 108 05/14/2014   Lab Results  Component Value Date   HDL 47.60 05/14/2014   Lab Results  Component Value Date   LDLCALC 43 05/14/2014   Lab Results  Component Value Date   TRIG 89.0 05/14/2014   Lab Results  Component Value Date   CHOLHDL 2 05/14/2014   Lab Results  Component Value Date   HGBA1C 9.1* 09/25/2014       Assessment & Plan:   Problem List Items Addressed This Visit    Essential hypertension    Other Visit Diagnoses    Hyponatremia    -  Primary    Relevant Orders    Basic Metabolic Panel (BMET)    Diabetes mellitus type II, uncontrolled           I have discontinued Ms. Holleman's ciprofloxacin. I am also having her maintain her aspirin EC, Niacin CR, b complex vitamins, cyclobenzaprine, pantoprazole, baclofen, valsartan, ONE TOUCH ULTRA TEST, glucose blood, ONE TOUCH ULTRA 2, traMADol, insulin aspart, insulin lispro, Insulin Detemir, gabapentin, metFORMIN, atorvastatin, hydrochlorothiazide, and glimepiride.  No orders of the defined types were placed in this encounter.     Garnet Koyanagi, DO

## 2014-10-16 NOTE — Assessment & Plan Note (Signed)
con't neurontin

## 2014-10-16 NOTE — Telephone Encounter (Signed)
Patient aware and she will come in tomorrow.     KP

## 2014-10-17 ENCOUNTER — Other Ambulatory Visit (INDEPENDENT_AMBULATORY_CARE_PROVIDER_SITE_OTHER): Payer: Medicare Other

## 2014-10-17 ENCOUNTER — Telehealth: Payer: Self-pay | Admitting: *Deleted

## 2014-10-17 ENCOUNTER — Other Ambulatory Visit: Payer: Medicare Other

## 2014-10-17 DIAGNOSIS — E039 Hypothyroidism, unspecified: Secondary | ICD-10-CM | POA: Diagnosis not present

## 2014-10-17 LAB — T3, FREE: T3, Free: 3.3 pg/mL (ref 2.3–4.2)

## 2014-10-17 LAB — BASIC METABOLIC PANEL
BUN: 11 mg/dL (ref 6–23)
CALCIUM: 9.5 mg/dL (ref 8.4–10.5)
CO2: 29 mEq/L (ref 19–32)
Chloride: 97 mEq/L (ref 96–112)
Creatinine, Ser: 0.61 mg/dL (ref 0.40–1.20)
GFR: 100.82 mL/min (ref >60.00)
GLUCOSE: 202 mg/dL — AB (ref 70–99)
Potassium: 4.2 mEq/L (ref 3.5–5.1)
SODIUM: 135 meq/L (ref 135–145)

## 2014-10-17 LAB — TSH: TSH: 4.43 u[IU]/mL (ref 0.35–4.50)

## 2014-10-17 LAB — T4, FREE: FREE T4: 0.93 ng/dL (ref 0.60–1.60)

## 2014-10-17 NOTE — Addendum Note (Signed)
Addended by: Kelle Darting A on: 10/17/2014 10:16 AM   Modules accepted: Orders

## 2014-10-17 NOTE — Telephone Encounter (Signed)
Pt came in and was told that she couldn't get labs because the order wasn't in. Gilmore Laroche assisted me with getting orders in for the patient. Then patient says that these labs are still not what she was concerned about. Please follow up with patient. Thanks.

## 2014-10-18 NOTE — Telephone Encounter (Signed)
The patient and I discussed her thyroid and these are the test that Dr.Lowne ordered. I will call the patient with the results.     KP

## 2014-10-31 ENCOUNTER — Other Ambulatory Visit (INDEPENDENT_AMBULATORY_CARE_PROVIDER_SITE_OTHER): Payer: Medicare Other

## 2014-10-31 DIAGNOSIS — E1165 Type 2 diabetes mellitus with hyperglycemia: Secondary | ICD-10-CM

## 2014-10-31 DIAGNOSIS — IMO0002 Reserved for concepts with insufficient information to code with codable children: Secondary | ICD-10-CM

## 2014-10-31 LAB — BASIC METABOLIC PANEL
BUN: 11 mg/dL (ref 6–23)
CALCIUM: 9.8 mg/dL (ref 8.4–10.5)
CO2: 30 meq/L (ref 19–32)
CREATININE: 0.62 mg/dL (ref 0.40–1.20)
Chloride: 99 mEq/L (ref 96–112)
GFR: 98.93 mL/min (ref >60.00)
Glucose, Bld: 143 mg/dL — ABNORMAL HIGH (ref 70–99)
Potassium: 4.6 mEq/L (ref 3.5–5.1)
Sodium: 138 mEq/L (ref 135–145)

## 2014-11-01 ENCOUNTER — Other Ambulatory Visit: Payer: Medicare Other

## 2014-11-01 LAB — FRUCTOSAMINE: FRUCTOSAMINE: 378 umol/L — AB (ref 0–285)

## 2014-11-08 ENCOUNTER — Ambulatory Visit (INDEPENDENT_AMBULATORY_CARE_PROVIDER_SITE_OTHER): Payer: Medicare Other | Admitting: Endocrinology

## 2014-11-08 ENCOUNTER — Encounter: Payer: Self-pay | Admitting: Endocrinology

## 2014-11-08 VITALS — BP 144/76 | HR 103 | Temp 98.0°F | Resp 16 | Ht 59.0 in | Wt 136.6 lb

## 2014-11-08 DIAGNOSIS — E1165 Type 2 diabetes mellitus with hyperglycemia: Secondary | ICD-10-CM | POA: Diagnosis not present

## 2014-11-08 DIAGNOSIS — Z23 Encounter for immunization: Secondary | ICD-10-CM

## 2014-11-08 DIAGNOSIS — I10 Essential (primary) hypertension: Secondary | ICD-10-CM | POA: Diagnosis not present

## 2014-11-08 DIAGNOSIS — IMO0002 Reserved for concepts with insufficient information to code with codable children: Secondary | ICD-10-CM

## 2014-11-08 MED ORDER — CANAGLIFLOZIN 100 MG PO TABS: ORAL_TABLET | ORAL | Status: DC

## 2014-11-08 NOTE — Progress Notes (Signed)
Patient ID: Mary Flynn, female   DOB: May 18, 1934, 80 y.o.   MRN: [id number]   Reason for Appointment: Diabetes follow-up   History of Present Illness   Diagnosis: Type 2 DIABETES MELITUS, date of diagnosis:   1995    Previous history: She has been on various regimens of hypoglycemic drugs over the last several years including Actos, Amaryl and Januvia. Because of cost her Januvia was stopped and Actos was stopped because of potential weight gain She was initially started on Levemir insulin because of fasting hyperglycemia in 2009 and the dose has been adjusted periodically In the last year she is also quite mealtime coverage because of postprandial hyperglycemia despite usually eating a low carbohydrate diet  Recent history:  Her A1c continues to be persistently over 8% and now over 9% She is consistently not following instructions to check blood sugars after meals to help adjust her mealtime coverage and is only checking fasting readings except on a couple of days.  Current blood sugar patterns and problems identified:  FASTING blood sugars are overall relatively better than before and she has been taking consistent amount of Levemir at night  She is still not getting enough monitoring after meals  Most of her readings after breakfast are high and she does not always take Novolog in the morning  Has a few readings after lunch or fairly good  No readings after supper  Has one blood sugar before supper which was high and another one high from eating sweets  She still has difficulty losing weight She is generally trying to be active Not clear if she is benefiting much from Amaryl at this time, previously would get hypoglycemia with this also Hypoglycemia :  none recently     Oral hypoglycemic drugs: Amaryl, metformin  Side effects from medications: None Insulin regimen: Levemir 16 units hs;  Novolog 6 ac lunch usually, occasionally 4 units at breakfast or dinner         Proper timing of medications in relation to meals: She is taking her NovoLog right after eating       Monitors blood glucose: Once a day.    Glucometer:  One Touch       Blood Glucose readings from meter download:   Mean values apply above for all meters except median for One Touch  PRE-MEAL Fasting Lunch  PCL  Bedtime Overall  Glucose range: 81-167  81-191  87-199 ?   Mean/median: 130    142    Meals: 3 meals per day. she is a vegetarian. Trying to get some protein at most meals including dairy products and tofu  Has Bfst 10 am,  lunch around 2 PM, dinner at 8 PM      Physical activity: exercise: walking 2 miles and Gym      Last visit with dietitian: 2009   Wt Readings from Last 3 Encounters:  11/08/14 136 lb 9.6 oz (61.961 kg)  10/16/14 136 lb (61.689 kg)  09/28/14 135 lb (61.236 kg)    LABS:  Lab Results  Component Value Date   HGBA1C 9.1* 09/25/2014   HGBA1C 8.4* 05/22/2014   HGBA1C 8.4* 05/14/2014   Lab Results  Component Value Date   MICROALBUR 7.2* 05/22/2014   LDLCALC 43 05/14/2014   CREATININE 0.62 10/31/2014       Medication List       This list is accurate as of: 11/08/14 11:59 PM.  Always use your most recent med list.  aspirin EC 81 MG tablet  Take 81 mg by mouth daily.     atorvastatin 10 MG tablet  Commonly known as:  LIPITOR  Take 1 tablet by mouth  every day     b complex vitamins tablet  Take 1 tablet by mouth daily.     baclofen 20 MG tablet  Commonly known as:  LIORESAL  TAKE 1 TABLET BY MOUTH THREE TIMES DAILY     canagliflozin 100 MG Tabs tablet  Commonly known as:  INVOKANA  1 tablet before breakfast     cyclobenzaprine 10 MG tablet  Commonly known as:  FLEXERIL  Take 10 mg by mouth 3 (three) times daily as needed for muscle spasms.     gabapentin 300 MG capsule  Commonly known as:  NEURONTIN  Take 1 capsule by mouth 3  times daily     glimepiride 4 MG tablet  Commonly known as:  AMARYL  Take 1 tablet  by mouth two  times daily     insulin aspart 100 UNIT/ML injection  Commonly known as:  novoLOG  Inject 6 Units into the skin daily.     Insulin Detemir 100 UNIT/ML Pen  Commonly known as:  LEVEMIR FLEXPEN  Inject 14 Units into the skin at bedtime.     insulin lispro 100 UNIT/ML injection  Commonly known as:  HUMALOG  Inject 6 units daily     metFORMIN 1000 MG tablet  Commonly known as:  GLUCOPHAGE  Take 1 tablet by mouth  twice a day with meals     Niacin CR 1000 MG Tbcr  1 po bid     ONE TOUCH ULTRA 2 W/DEVICE Kit  Use to test blood sugar 2 times daily as instructed. Dx code: E11.49     ONE TOUCH ULTRA TEST test strip  Generic drug:  glucose blood  Check blood sugar twice daily     glucose blood test strip  Commonly known as:  ONE TOUCH ULTRA TEST  Use to test blood sugar 2 times daily as instructed. Dx code: E11.49     pantoprazole 40 MG tablet  Commonly known as:  PROTONIX  TAKE 1 TABLET BY MOUTH ONCE DAILY     traMADol 50 MG tablet  Commonly known as:  ULTRAM  Take 1 tablet (50 mg total) by mouth every 6 (six) hours as needed.     valsartan 320 MG tablet  Commonly known as:  DIOVAN  Take 1 tablet (320 mg total) by mouth daily.        Allergies:  Allergies  Allergen Reactions  . Penicillins Hives  . Sulfa Antibiotics Hives  . Sulfonamide Derivatives     Past Medical History  Diagnosis Date  . Diabetes mellitus   . Hyperlipidemia   . Hypertension   . Left rib fracture 02/18/2014    Past Surgical History  Procedure Laterality Date  . Appendectomy    . Surgical sterilization  40 years ago  . Refractive surgery      Left Eye    Family History  Problem Relation Age of Onset  . Depression    . Cancer      laryngeal  . Diabetes    . Diabetes Mother     Social History:  reports that she has never smoked. She has never used smokeless tobacco. She reports that she does not drink alcohol or use illicit drugs.  Review of  Systems:  Hypertension:    On  Diovan 320 mg. with  relatively high blood pressure  in the office She does not monitor at home regularly and blood pressure is followed by PCP  HYPONATREMIA: Sodium is to normal with stopping HCTZ She was told to take only half tablet but she stopped it completely  Lab Results  Component Value Date   CREATININE 0.62 10/31/2014   BUN 11 10/31/2014   NA 138 10/31/2014   K 4.6 10/31/2014   CL 99 10/31/2014   CO2 30 10/31/2014    Lipids: Has had diabetic dyslipidemia,  well controlled and HDL is also fairly good  Lab Results  Component Value Date   CHOL 108 05/14/2014   HDL 47.60 05/14/2014   LDLCALC 43 05/14/2014   TRIG 89.0 05/14/2014   CHOLHDL 2 05/14/2014    Neuropathy: She previously had sharp pains in her lower legs but no numbness.still on gabapentin, no symptoms   Diabetic foot exam done in 12/15 Diabetic foot exam showed normal monofilament sensation in the toes and plantar surfaces, no skin lesions or ulcers on the feet and normal pedal pulses  She has been regular with her eye exams annually    Examination:   BP 144/76 mmHg  Pulse 103  Temp(Src) 98 F (36.7 C)  Resp 16  Ht _0  (1.499 m)  Wt 136 lb 9.6 oz (61.961 kg)  BMI 27.57 kg/m2  SpO2 96%  Body mass index is 27.57 kg/(m^2).   No pedal edema Diabetic foot exam shows normal monofilament sensation in the toes and plantar surfaces, no skin lesions or ulcers on the feet and normal pedal pulses   ASSESSMENT/ PLAN:   Diabetes type 2   Blood glucose control is relatively poor with A1c now over 9% See history of present illness for detailed discussion of his current management, blood sugar patterns and problems identified She does have mostly high postprandial rather than fasting readings and possibly her sugars are higher before supper because of inadequate duration of action of Levemir; she has been suggested taking Levemir in the morning before but she does not do  so Most likely not getting enough for no insulin for breakfast and suppertime Still concerned about her difficulty with losing weight despite being fairly active on a daily basis  Discussed that additional medications are needed to help her control.Discussed action of SGLT 2 drugs on lowering glucose by decreasing kidney absorption of glucose, benefits of weight loss and lower blood pressure, possible side effects including candidiasis and dosage regimen  She will start with Invokana 100 mg Discussed needing to check readings after meals more consistently and less in the morning She will try Levemir in the morning instead and most likely will need to reduce the dose when starting Invokana  Hypertension: Blood pressure is followed by PCP, high normal  HYPONATREMIA: This is better from stopping HCTZ, consider Aldactone if blood pressure high   Patient Instructions  Change Levemir to mornings, 14 units daily  Stop Glimeperide at end of month  Invokana in ams  Check BP weekly  Check blood sugars on waking up ..4  .. times a week Also check blood sugars about 2 hours after a meal and do this after different meals by rotation Recommended blood sugar levels on waking up is 90-130 and about 2 hours after meal is 140-180 Please bring blood sugar monitor to each visit.    Counseling time on subjects discussed above is over 50% of today's 25 minute visit   Ardys Hataway 11/11/2014, 9:30 AM

## 2014-11-08 NOTE — Patient Instructions (Addendum)
Change Levemir to mornings, 14 units daily  Stop Glimeperide at end of month  Invokana in ams  Check BP weekly  Check blood sugars on waking up ..4  .. times a week Also check blood sugars about 2 hours after a meal and do this after different meals by rotation Recommended blood sugar levels on waking up is 90-130 and about 2 hours after meal is 140-180 Please bring blood sugar monitor to each visit.

## 2014-11-09 ENCOUNTER — Telehealth: Payer: Self-pay | Admitting: Endocrinology

## 2014-11-09 NOTE — Telephone Encounter (Signed)
Pt wants a call back - regarding the levemir to be taken in the morning,

## 2014-11-09 NOTE — Telephone Encounter (Signed)
She needs to check the cost of Jardiance or Farxiga

## 2014-11-09 NOTE — Telephone Encounter (Signed)
Pt is asking we change the invokana because it is too expensive

## 2014-11-09 NOTE — Telephone Encounter (Signed)
Patient did not take her levemir yesterday, she said at bedtime her sugar was 109, when she woke up this morning it was 196.  She took 14 units this morning and wants to know if that is okay?

## 2014-11-09 NOTE — Telephone Encounter (Signed)
It will take 2 or 3 days for the morning sugar to improve, need to give time for the Invokana to work

## 2014-11-09 NOTE — Telephone Encounter (Signed)
Patient said she called you and ask if you would call her on this number [phone]

## 2014-11-09 NOTE — Telephone Encounter (Signed)
Please see below and advise.

## 2014-11-12 ENCOUNTER — Telehealth: Payer: Self-pay | Admitting: Endocrinology

## 2014-11-12 NOTE — Telephone Encounter (Addendum)
Patient stated that the Vania Rea was to expensive, and the [place] her insurance will not cover. Please advise

## 2014-11-12 NOTE — Telephone Encounter (Signed)
I called the pt to check and see which medication was cheaper and neither one will work.Could you please advise this pt on a medication that she may be able to take.

## 2014-11-12 NOTE — Telephone Encounter (Signed)
There is no alternative.  She can try taking the Invokana with the free trial and ask company if they can help with the cost

## 2014-11-12 NOTE — Telephone Encounter (Signed)
Pt agreed to call insurance company to fin dout how much this two medications would cost her.She stated she would call back with answer

## 2014-11-13 ENCOUNTER — Telehealth: Payer: Self-pay | Admitting: Endocrinology

## 2014-11-13 NOTE — Telephone Encounter (Signed)
Pt has agreed to come in on 11/14/2014 and pick up the free trial for Invokana

## 2014-11-13 NOTE — Telephone Encounter (Signed)
Left pt a Vm to return call

## 2014-11-13 NOTE — Telephone Encounter (Signed)
Patient is returning your call.  

## 2014-11-13 NOTE — Telephone Encounter (Signed)
Left pt a VM to return call for updated information from Dr.Kumar

## 2014-11-26 ENCOUNTER — Other Ambulatory Visit: Payer: Self-pay | Admitting: Family Medicine

## 2014-11-29 ENCOUNTER — Other Ambulatory Visit (INDEPENDENT_AMBULATORY_CARE_PROVIDER_SITE_OTHER): Payer: Medicare Other

## 2014-11-29 DIAGNOSIS — E1165 Type 2 diabetes mellitus with hyperglycemia: Secondary | ICD-10-CM | POA: Diagnosis not present

## 2014-11-29 DIAGNOSIS — IMO0002 Reserved for concepts with insufficient information to code with codable children: Secondary | ICD-10-CM

## 2014-11-29 LAB — BASIC METABOLIC PANEL
BUN: 10 mg/dL (ref 6–23)
CALCIUM: 9.5 mg/dL (ref 8.4–10.5)
CO2: 27 mEq/L (ref 19–32)
Chloride: 100 mEq/L (ref 96–112)
Creatinine, Ser: 0.6 mg/dL (ref 0.40–1.20)
GFR: 102.73 mL/min (ref >60.00)
GLUCOSE: 139 mg/dL — AB (ref 70–99)
Potassium: 4.1 mEq/L (ref 3.5–5.1)
SODIUM: 138 meq/L (ref 135–145)

## 2014-11-30 LAB — FRUCTOSAMINE: FRUCTOSAMINE: 325 umol/L — AB (ref 0–285)

## 2014-12-05 ENCOUNTER — Encounter: Payer: Self-pay | Admitting: Endocrinology

## 2014-12-05 ENCOUNTER — Ambulatory Visit (INDEPENDENT_AMBULATORY_CARE_PROVIDER_SITE_OTHER): Payer: Medicare Other | Admitting: Endocrinology

## 2014-12-05 VITALS — BP 154/92 | HR 101 | Temp 98.4°F | Resp 14 | Ht 59.0 in | Wt 135.2 lb

## 2014-12-05 DIAGNOSIS — E1165 Type 2 diabetes mellitus with hyperglycemia: Secondary | ICD-10-CM | POA: Diagnosis not present

## 2014-12-05 DIAGNOSIS — Z794 Long term (current) use of insulin: Secondary | ICD-10-CM | POA: Diagnosis not present

## 2014-12-05 NOTE — Progress Notes (Signed)
Patient ID: Mary Flynn, female   DOB: December 26, 1934, 80 y.o.   MRN: [id number]   Reason for Appointment: Diabetes follow-up   History of Present Illness   Diagnosis: Type 2 DIABETES MELITUS, date of diagnosis:   1995    Previous history: She has been on various regimens of hypoglycemic drugs over the last several years including Actos, Amaryl and Januvia. Because of cost her Januvia was stopped and Actos was stopped because of potential weight gain She was initially started on Levemir insulin because of fasting hyperglycemia in 2009 and the dose has been adjusted periodically In the last year she is also quite mealtime coverage because of postprandial hyperglycemia despite usually eating a low carbohydrate diet  Recent history:  Her A1c continues to be persistently over 8%  Because of her A1c over 9% last month she was recommended Invokana this is too expensive for her and she will not take it Also she was told to try taking LEVEMIR in the morning instead of evening to help with daytime hyperglycemia but she still has not changed  Current blood sugar patterns and problems identified:  FASTING blood sugars are overall resumed controlled with some variability, has only one low reading  POST prandial readings are mostly high after lunch and also periodically after evening meal  Checking blood sugars mostly in the morning and after lunch and blood sugars appear to be gradually increasing during the day  She says she tried to cut out all the carbohydrates in her diet but she started having abdominal discomfort and stopped doing so.  Fructosamine indicates relatively better control  She still has difficulty losing weight She is generally trying to be very active with walking or other exercise  Hypoglycemia :  as above  Oral hypoglycemic drugs: Amaryl, metformin  Side effects from medications: None.  Hypoglycemia when taking the pills a  Insulin regimen: Levemir 16 units hs;   Novolog 6 ac lunch, occasionally 4 units at breakfast or dinner       Proper timing of medications in relation to meals: She is taking her NovoLog right after eating       Monitors blood glucose: Once a day.    Glucometer:  One Touch       Blood Glucose readings from meter download:   Mean values apply above for all meters except median for One Touch  PRE-MEAL Fasting Lunch Dinner Bedtime Overall  Glucose range: 65-175    91-242    Mean/median:  133   153    170   147    POST-MEAL PC Breakfast PC Lunch PC Dinner  Glucose range:   134-208    Mean/median:   169     Meals: 3 meals per day. she is a vegetarian. Trying to get some protein at most meals including dairy products and tofu  Has Bfst 10 am,  lunch around 2 PM, dinner at 8 PM      Physical activity: exercise: walking 2 miles and Gym      Last visit with dietitian: 2009   Wt Readings from Last 3 Encounters:  12/05/14 135 lb 3.2 oz (61.326 kg)  11/08/14 136 lb 9.6 oz (61.961 kg)  10/16/14 136 lb (61.689 kg)    LABS:  Lab Results  Component Value Date   HGBA1C 9.1* 09/25/2014   HGBA1C 8.4* 05/22/2014   HGBA1C 8.4* 05/14/2014   Lab Results  Component Value Date   MICROALBUR 7.2* 05/22/2014   LDLCALC 43 05/14/2014  CREATININE 0.60 11/29/2014       Medication List       This list is accurate as of: 12/05/14  4:30 PM.  Always use your most recent med list.               aspirin EC 81 MG tablet  Take 81 mg by mouth daily.     atorvastatin 10 MG tablet  Commonly known as:  LIPITOR  Take 1 tablet by mouth  every day     b complex vitamins tablet  Take 1 tablet by mouth daily.     baclofen 20 MG tablet  Commonly known as:  LIORESAL  TAKE 1 TABLET BY MOUTH THREE TIMES DAILY     canagliflozin 100 MG Tabs tablet  Commonly known as:  INVOKANA  1 tablet before breakfast     cyclobenzaprine 10 MG tablet  Commonly known as:  FLEXERIL  Take 10 mg by mouth 3 (three) times daily as needed for muscle spasms.       gabapentin 300 MG capsule  Commonly known as:  NEURONTIN  Take 1 capsule by mouth 3  times daily     glimepiride 4 MG tablet  Commonly known as:  AMARYL  Take 1 tablet by mouth two  times daily     insulin aspart 100 UNIT/ML injection  Commonly known as:  novoLOG  Inject 6 Units into the skin daily.     Insulin Detemir 100 UNIT/ML Pen  Commonly known as:  LEVEMIR FLEXPEN  Inject 14 Units into the skin at bedtime.     insulin lispro 100 UNIT/ML injection  Commonly known as:  HUMALOG  Inject 6 units daily     metFORMIN 1000 MG tablet  Commonly known as:  GLUCOPHAGE  Take 1 tablet by mouth  twice a day with meals     Niacin CR 1000 MG Tbcr  1 po bid     ONE TOUCH ULTRA 2 W/DEVICE Kit  Use to test blood sugar 2 times daily as instructed. Dx code: E11.49     ONE TOUCH ULTRA TEST test strip  Generic drug:  glucose blood  Check blood sugar twice daily     glucose blood test strip  Commonly known as:  ONE TOUCH ULTRA TEST  Use to test blood sugar 2 times daily as instructed. Dx code: E11.49     pantoprazole 40 MG tablet  Commonly known as:  PROTONIX  TAKE 1 TABLET BY MOUTH ONCE DAILY     traMADol 50 MG tablet  Commonly known as:  ULTRAM  Take 1 tablet (50 mg total) by mouth every 6 (six) hours as needed.     valsartan 320 MG tablet  Commonly known as:  DIOVAN  Take 1 tablet by mouth  daily        Allergies:  Allergies  Allergen Reactions  . Penicillins Hives  . Sulfa Antibiotics Hives  . Sulfonamide Derivatives     Past Medical History  Diagnosis Date  . Diabetes mellitus   . Hyperlipidemia   . Hypertension   . Left rib fracture 02/18/2014    Past Surgical History  Procedure Laterality Date  . Appendectomy    . Surgical sterilization  40 years ago  . Refractive surgery      Left Eye    Family History  Problem Relation Age of Onset  . Depression    . Cancer      laryngeal  . Diabetes    . Diabetes  Mother     Social History:  reports  that she has never smoked. She has never used smokeless tobacco. She reports that she does not drink alcohol or use illicit drugs.  Review of Systems:  Hypertension:    On  Diovan 320 mg. has high blood pressure  in the office here.  Blood pressure has been better with her PCP previously She does not monitor at home regularly and blood pressure is followed by PCP  Lipids: Has had diabetic dyslipidemia,  well controlled and HDL is also fairly good  Lab Results  Component Value Date   CHOL 108 05/14/2014   HDL 47.60 05/14/2014   LDLCALC 43 05/14/2014   TRIG 89.0 05/14/2014   CHOLHDL 2 05/14/2014    Neuropathy: She previously had sharp pains in her lower legs but no numbness.still on gabapentin, no symptoms currently   Diabetic foot exam done in 9/16  Diabetic foot exam showed normal monofilament sensation in the toes and plantar surfaces, no skin lesions or ulcers on the feet and normal pedal pulses  She has been regular with her eye exams annually    Examination:   BP 154/92 mmHg  Pulse 101  Temp(Src) 98.4 F (36.9 C)  Resp 14  Ht _0  (1.499 m)  Wt 135 lb 3.2 oz (61.326 kg)  BMI 27.29 kg/m2  SpO2 97%  Body mass index is 27.29 kg/(m^2).     ASSESSMENT/ PLAN:   Diabetes type 2   Blood glucose control is somewhat better with her trying to be consistent with her diet and mealtime insulin  See history of present illness for detailed discussion of his current management, blood sugar patterns and problems identified  She is insisting on using generic medications even though she may benefit from other medications such as Invokana or Victoza; she has brought her formulary for review today  Although her fructosamine is better it is still high indicating likely postprandial hyperglycemia She probably will need at least 1-2 units more to cover her lunch and dinner Advisor that she can be having carbohydrates with her meals and if need be increased her Novolog for  this  She will try Levemir in the morning instead and most likely will need to adjust the dose of fasting readings are getting higher She will call if she has any further questions on her insulin adjustment  Hypertension: Blood pressure is followed by PCP, usually high normal when checked here    Patient Instructions   Levemir 16 units in am  Novolog 7 ac lunch  4 units at breakfast and 5 dinner  and can go up 1-2 more for more carbs     Counseling time on subjects discussed above is over 50% of today's 25 minute visit   Laurali Goddard 12/05/2014, 4:30 PM

## 2014-12-05 NOTE — Patient Instructions (Signed)
Levemir 16 units in am  Novolog 7 ac lunch  4 units at breakfast and 5 dinner  and can go up 1-2 more for more carbs

## 2014-12-10 NOTE — Telephone Encounter (Signed)
Error.DJR

## 2014-12-13 ENCOUNTER — Telehealth: Payer: Self-pay | Admitting: Endocrinology

## 2014-12-13 NOTE — Telephone Encounter (Signed)
Patient called stating that she would like to speak with Dr. Dwyane Dee regarding her blood sugars still high  Medication is not working    Please advise patient    Thank you

## 2014-12-14 ENCOUNTER — Telehealth: Payer: Self-pay | Admitting: Endocrinology

## 2014-12-14 NOTE — Telephone Encounter (Signed)
Patient had already left.

## 2014-12-14 NOTE — Telephone Encounter (Signed)
Mary Flynn is coming in today to bring her meter for download, she said she is taking her levemir at night, and her short acting insulin three times a day, but her blood sugars are still very high. She says she is exercising and doing what she is suppose to do.

## 2014-12-14 NOTE — Telephone Encounter (Signed)
Please schedule her to see me  

## 2014-12-14 NOTE — Telephone Encounter (Signed)
Noted, detailed message left on patients voice mail.

## 2014-12-14 NOTE — Telephone Encounter (Signed)
Patient concerned about high blood sugars: Fasting blood sugars recently 144-188, lunchtime over 200, bedtime 175, 46  Patient will be needing to increase her insulin with LEVEMIR 18 units at night, HUMALOG 6 units at breakfast, 7 at lunch and 4 at supper

## 2014-12-20 ENCOUNTER — Other Ambulatory Visit: Payer: Self-pay | Admitting: *Deleted

## 2014-12-20 MED ORDER — GLIMEPIRIDE 4 MG PO TABS: ORAL_TABLET | ORAL | Status: DC

## 2014-12-21 ENCOUNTER — Other Ambulatory Visit: Payer: Self-pay | Admitting: Endocrinology

## 2014-12-31 ENCOUNTER — Telehealth: Payer: Self-pay | Admitting: *Deleted

## 2014-12-31 NOTE — Telephone Encounter (Signed)
"  Is there any way possible you can get her in tomorrow to see Dr. Milinda Pointer?  They told her they don't have anything until 01/22/2015.  She is having a lot of pain her leg and foot.  She's Diabetic?"  Dr. Milinda Pointer doesn't have anything tomorrow or Thursday. I can call you if there is a cancellation.  "Can you get her in tomorrow with another doctor?"  You'll have to talk to a scheduler.  I am not sure.  "Okay, let me get home and see if she will see another doctor on tomorrow.  I will call back to schedule that once I talk to her.  You will call me if there is a cancellation correct?"  Yes, I will call you if there is a cancellation.

## 2015-01-01 ENCOUNTER — Encounter: Payer: Self-pay | Admitting: Podiatry

## 2015-01-01 ENCOUNTER — Ambulatory Visit (INDEPENDENT_AMBULATORY_CARE_PROVIDER_SITE_OTHER): Payer: Medicare Other | Admitting: Podiatry

## 2015-01-01 ENCOUNTER — Ambulatory Visit (INDEPENDENT_AMBULATORY_CARE_PROVIDER_SITE_OTHER): Payer: Medicare Other

## 2015-01-01 VITALS — BP 171/99 | HR 102 | Resp 16 | Ht 60.0 in | Wt 130.0 lb

## 2015-01-01 DIAGNOSIS — M7750 Other enthesopathy of unspecified foot: Secondary | ICD-10-CM

## 2015-01-01 DIAGNOSIS — M778 Other enthesopathies, not elsewhere classified: Secondary | ICD-10-CM

## 2015-01-01 DIAGNOSIS — M79672 Pain in left foot: Secondary | ICD-10-CM

## 2015-01-01 DIAGNOSIS — M79671 Pain in right foot: Secondary | ICD-10-CM | POA: Diagnosis not present

## 2015-01-01 DIAGNOSIS — M779 Enthesopathy, unspecified: Secondary | ICD-10-CM

## 2015-01-01 NOTE — Progress Notes (Signed)
She presents today with a chief complaint of pain to the forefoot bilaterally. She states that she thinks her neuropathy is worsening but she has severe pain to the second metatarsophalangeal joint as she points to this area. She states that she tries to exercise and wear her shoes on a regular basis. Though she does present today with flip-flops. She denies trauma. She denies significant changes in her past medical history medications allergies and surgery. She states that her diabetes and her peripheral neuropathy seems to be worsening and that her fibromyalgia keeps her from exercising properly.  Objective: Vital signs are stable she is alert and oriented 3 mildly hypertensive. Pulses are strongly palpable. Neurologic sensorium is slightly diminished per Semmes-Weinstein monofilament. Deep tendon reflexes are not elicitable muscle strength +5 over 5 dorsiflexion plantar flexors and inverters and everters all intrinsic musculature is intact. Orthopedic evaluation demonstrates all joints distal to the ankle have a full range of motion without crepitation. She has pain on palpation and in the range of motion of the second metatarsophalangeal joint bilaterally. Mild hallux valgus deformity and hammertoe deformities are noted bilateral. Radiographs taken today do demonstrate an elongated second metatarsal with early dislocation syndrome second metatarsophalangeal joint.  Assessment: Capsulitis second metatarsophalangeal joint bilateral. Diabetic neuropathy bilateral.  Plan: Discussed etiology pathology conservative versus surgical therapies. I injected Kenalog and local anesthetic about the second metatarsophalangeal joints bilaterally. I will follow-up with her in 1 month.

## 2015-01-02 ENCOUNTER — Telehealth: Payer: Self-pay | Admitting: *Deleted

## 2015-01-02 NOTE — Telephone Encounter (Addendum)
Pt states yesterday Dr. Milinda Pointer gave injection in left and right leg, and last night had shooting pain in the left.  I asked pt if it was her left foot or leg, pt states the foot and it hurts on the top and bottom, and has taken one Aleve and it helped a little, but would like to know what else to do. Pt called states she has not received a response from her call.  I told pt to go into a firm bottom athletic shoe with an arch support, ice 3-4 times a day for 10-15 mins/episode and not go bare footed as she has described, and I would call again with the directions from Dr. Milinda Pointer.  Pt agreed.  Dr. Milinda Pointer ordered Meloxicam 15mg  #30 one tablet q day.  Orders to pt and pharmacy.

## 2015-01-03 NOTE — Telephone Encounter (Signed)
We discussed stiff soled shoes.  You may prescribe Meloxicam 15mg  one po qd (morning).

## 2015-01-04 ENCOUNTER — Telehealth: Payer: Self-pay | Admitting: *Deleted

## 2015-01-04 MED ORDER — MELOXICAM 15 MG PO TABS: 15.0000 mg | ORAL_TABLET | Freq: Every day | ORAL | Status: DC

## 2015-01-04 NOTE — Telephone Encounter (Signed)
Pt called after Meloxicam was ordered and requested an appt.  Transferred to schedulers.

## 2015-01-08 ENCOUNTER — Encounter: Payer: Self-pay | Admitting: Podiatry

## 2015-01-08 ENCOUNTER — Ambulatory Visit (INDEPENDENT_AMBULATORY_CARE_PROVIDER_SITE_OTHER): Payer: Medicare Other | Admitting: Podiatry

## 2015-01-08 VITALS — BP 89/48 | HR 96 | Resp 16

## 2015-01-08 DIAGNOSIS — M7750 Other enthesopathy of unspecified foot: Secondary | ICD-10-CM | POA: Diagnosis not present

## 2015-01-08 DIAGNOSIS — M779 Enthesopathy, unspecified: Principal | ICD-10-CM

## 2015-01-08 DIAGNOSIS — M778 Other enthesopathies, not elsewhere classified: Secondary | ICD-10-CM

## 2015-01-08 NOTE — Progress Notes (Signed)
She presents today to follow up with her capsulitis left foot. He states that the right foot is doing much better after the injections last time at the left that is just killing her.  Objective: Vital signs are stable she is alert and oriented 3. Pulses are palpable. She is pain on end range of motion of the second metatarsophalangeal joint left foot.  Assessment: Capsulitis second metatarsophalangeal joint left foot.  Plan: Discussed etiology pathology conservative versus surgical therapies. I injected the medial aspect of the first metatarsophalangeal joint left foot. As well as the second metatarsophalangeal joint intra-articularly. Follow up with her in 1 month. Remember to thank her for the food she left as last time.

## 2015-01-11 ENCOUNTER — Encounter: Payer: Self-pay | Admitting: Family

## 2015-01-11 ENCOUNTER — Telehealth: Payer: Self-pay | Admitting: Family

## 2015-01-11 ENCOUNTER — Ambulatory Visit (INDEPENDENT_AMBULATORY_CARE_PROVIDER_SITE_OTHER): Payer: Medicare Other | Admitting: Family

## 2015-01-11 VITALS — BP 160/90 | HR 86 | Temp 98.3°F | Resp 16 | Ht 59.0 in | Wt 133.4 lb

## 2015-01-11 DIAGNOSIS — R6889 Other general symptoms and signs: Secondary | ICD-10-CM

## 2015-01-11 DIAGNOSIS — K5900 Constipation, unspecified: Secondary | ICD-10-CM

## 2015-01-11 DIAGNOSIS — I10 Essential (primary) hypertension: Secondary | ICD-10-CM | POA: Insufficient documentation

## 2015-01-11 MED ORDER — PANTOPRAZOLE SODIUM 40 MG PO TBEC: 40.0000 mg | DELAYED_RELEASE_TABLET | Freq: Every day | ORAL | Status: DC

## 2015-01-11 MED ORDER — POLYETHYLENE GLYCOL 3350 17 GM/SCOOP PO POWD: 17.0000 g | Freq: Every day | ORAL | Status: DC

## 2015-01-11 MED ORDER — METOPROLOL SUCCINATE ER 25 MG PO TB24: 25.0000 mg | ORAL_TABLET | Freq: Every day | ORAL | Status: DC

## 2015-01-11 NOTE — Telephone Encounter (Signed)
Please let pt know that I reviewed her BP and it is elevated. I would recommend that she add toprol xl once daily and follow up in 2 weeks for nurse visit BP check and HR.

## 2015-01-11 NOTE — Assessment & Plan Note (Signed)
Uncontrolled, resume miralax.

## 2015-01-11 NOTE — Assessment & Plan Note (Signed)
BP is elevated.  Was also elevated at her endo apt. Will add toprol xl , continue diovan.

## 2015-01-11 NOTE — Progress Notes (Signed)
Pre visit review using our clinic review tool, if applicable. No additional management support is needed unless otherwise documented below in the visit note. 

## 2015-01-11 NOTE — Progress Notes (Signed)
Subjective:    Patient ID: Mary Flynn, female    DOB: 03-Feb-1935, 80 y.o.   MRN: [id number]  HPI  Mary Flynn is a 80 yr old female who presents today to discuss constipation. Pt has seen GI in the past.  She reports that for many years she took miralax every day and this helped her constipation. She stopped due to cost and reports that she would like [place] to send rx to her mail order for miralax as this is more affordable. Also requesting refill on protonix. Reports last BM was 2 weeks ago.   Reports + mucous in her throat. Using tumeric in milk and this seems to have worsened her constipation and so she stopped the tumeric.    HTN- currently maintained on diovan.  Review of Systems    see HPI  Past Medical History  Diagnosis Date  . Diabetes mellitus   . Hyperlipidemia   . Hypertension   . Left rib fracture 02/18/2014    Social History   Social History  . Marital Status: Married    Spouse Name: Mary Flynn  . Number of Children: 2  . Years of Education: College   Occupational History  . housewife    Social History Main Topics  . Smoking status: Never Smoker   . Smokeless tobacco: Never Used  . Alcohol Use: No  . Drug Use: No  . Sexual Activity: Not Currently   Other Topics Concern  . Not on file   Social History Narrative   Patient lives at home with spouse.   Caffeine Use: rarely    Past Surgical History  Procedure Laterality Date  . Appendectomy    . Surgical sterilization  40 years ago  . Refractive surgery      Left Eye    Family History  Problem Relation Age of Onset  . Depression    . Cancer      laryngeal  . Diabetes    . Diabetes Mother     Allergies  Allergen Reactions  . Penicillins Hives  . Sulfa Antibiotics Hives  . Sulfonamide Derivatives     Current Outpatient Prescriptions on File Prior to Visit  Medication Sig Dispense Refill  . aspirin EC 81 MG tablet Take 81 mg by mouth daily.    Marland Kitchen atorvastatin (LIPITOR) 10 MG tablet Take  1 tablet by mouth  every day 90 tablet 1  . b complex vitamins tablet Take 1 tablet by mouth daily.    . baclofen (LIORESAL) 20 MG tablet TAKE 1 TABLET BY MOUTH THREE TIMES DAILY 270 tablet 0  . Blood Glucose Monitoring Suppl (ONE TOUCH ULTRA 2) W/DEVICE KIT Use to test blood sugar 2 times daily as instructed. Dx code: E11.49 1 each 0  . cyclobenzaprine (FLEXERIL) 10 MG tablet Take 10 mg by mouth 3 (three) times daily as needed for muscle spasms.    Marland Kitchen gabapentin (NEURONTIN) 300 MG capsule Take 1 capsule by mouth 3  times daily 270 capsule 1  . glimepiride (AMARYL) 4 MG tablet Take 1 tablet by mouth two  times daily 180 tablet 1  . glucose blood (ONE TOUCH ULTRA TEST) test strip Use to test blood sugar 2 times daily as instructed. Dx code: E11.49 200 each 3  . Insulin Detemir (LEVEMIR FLEXPEN) 100 UNIT/ML Pen Inject 14 Units into the skin at bedtime. (Patient taking differently: Inject 18 Units into the skin at bedtime. ) 15 mL 3  . insulin lispro (HUMALOG) 100 UNIT/ML injection  Inject 6 units daily (Patient taking differently: Inject 4 units in am, 6 units at lunch and 4 units at supper.) 10 mL 3  . meloxicam (MOBIC) 15 MG tablet Take 1 tablet (15 mg total) by mouth daily. 30 tablet 2  . metFORMIN (GLUCOPHAGE) 1000 MG tablet TAKE 1 TABLET BY MOUTH TWICE DAILY WITH MEALS 60 tablet 3  . Niacin CR 1000 MG TBCR 1 po bid (Patient taking differently: 1 po daily) 60 each 5  . ONE TOUCH ULTRA TEST test strip Check blood sugar twice daily 200 each 3  . traMADol (ULTRAM) 50 MG tablet Take 1 tablet (50 mg total) by mouth every 6 (six) hours as needed. 180 tablet 1  . valsartan (DIOVAN) 320 MG tablet Take 1 tablet by mouth  daily 90 tablet 1  . canagliflozin (INVOKANA) 100 MG TABS tablet 1 tablet before breakfast (Patient not taking: Reported on 01/11/2015) 30 tablet 3   No current facility-administered medications on file prior to visit.    BP 160/90 mmHg  Pulse 86  Temp(Src) 98.3 F (36.8 C) (Oral)   Resp 16  Ht _0  (1.499 m)  Wt 133 lb 6.4 oz (60.51 kg)  BMI 26.93 kg/m2  SpO2 100%    Objective:   Physical Exam  Constitutional: She is oriented to person, place, and time. She appears well-developed and well-nourished.  HENT:  Head: Normocephalic and atraumatic.  Cardiovascular: Normal rate, regular rhythm and normal heart sounds.   No murmur heard. Pulmonary/Chest: Effort normal and breath sounds normal. No respiratory distress. She has no wheezes.  Neurological: She is alert and oriented to person, place, and time.  Psychiatric: She has a normal mood and affect. Her behavior is normal. Judgment and thought content normal.          Assessment & Plan:  Congestion- trial of claritin.

## 2015-01-11 NOTE — Telephone Encounter (Signed)
Notified pt and she voices understanding. 30 day supply sent to [place] as pt will want to use mail order if she continues medication after nurse visit in 2 weeks. Appt scheduled for 01/25/15 at 10:30am and Rx that went to [facility] was placed on hold be Danielle.

## 2015-01-11 NOTE — Patient Instructions (Addendum)
Try claritin 10mg  once daily as needed for nasal congestion.  Start miralax today. Call if you do not have BM in next 1-2 days.

## 2015-01-16 DIAGNOSIS — H04322 Acute dacryocystitis of left lacrimal passage: Secondary | ICD-10-CM | POA: Diagnosis not present

## 2015-01-16 DIAGNOSIS — B0239 Other herpes zoster eye disease: Secondary | ICD-10-CM | POA: Diagnosis not present

## 2015-01-16 DIAGNOSIS — H40013 Open angle with borderline findings, low risk, bilateral: Secondary | ICD-10-CM | POA: Diagnosis not present

## 2015-01-16 DIAGNOSIS — B029 Zoster without complications: Secondary | ICD-10-CM | POA: Diagnosis not present

## 2015-01-16 DIAGNOSIS — H04202 Unspecified epiphora, left lacrimal gland: Secondary | ICD-10-CM | POA: Diagnosis not present

## 2015-01-16 DIAGNOSIS — H538 Other visual disturbances: Secondary | ICD-10-CM | POA: Diagnosis not present

## 2015-01-22 ENCOUNTER — Ambulatory Visit: Payer: Self-pay | Admitting: Podiatry

## 2015-01-22 ENCOUNTER — Other Ambulatory Visit: Payer: Self-pay | Admitting: *Deleted

## 2015-01-22 ENCOUNTER — Telehealth: Payer: Self-pay | Admitting: Endocrinology

## 2015-01-22 MED ORDER — INSULIN LISPRO 100 UNIT/ML (KWIKPEN): PEN_INJECTOR | SUBCUTANEOUS | Status: DC

## 2015-01-22 MED ORDER — INSULIN PEN NEEDLE 32G X 4 MM MISC: Status: AC

## 2015-01-22 MED ORDER — INSULIN LISPRO 100 UNIT/ML ~~LOC~~ SOLN: SUBCUTANEOUS | Status: DC

## 2015-01-22 NOTE — Telephone Encounter (Signed)
Rx sent to [facility] per patient request.

## 2015-01-22 NOTE — Telephone Encounter (Signed)
The humalog pens need to be called in 90 day supply [facility]

## 2015-01-22 NOTE — Telephone Encounter (Signed)
Pt is wanting humalog qwik pens not the pen needles

## 2015-01-24 ENCOUNTER — Other Ambulatory Visit: Payer: Self-pay | Admitting: Endocrinology

## 2015-01-25 ENCOUNTER — Ambulatory Visit (INDEPENDENT_AMBULATORY_CARE_PROVIDER_SITE_OTHER): Payer: Medicare Other | Admitting: Family

## 2015-01-25 VITALS — BP 177/99 | HR 108

## 2015-01-25 DIAGNOSIS — I10 Essential (primary) hypertension: Secondary | ICD-10-CM

## 2015-01-25 MED ORDER — METOPROLOL SUCCINATE ER 50 MG PO TB24: 50.0000 mg | ORAL_TABLET | Freq: Every day | ORAL | Status: DC

## 2015-01-25 NOTE — Progress Notes (Signed)
   Subjective:    Patient ID: Mary Flynn, female    DOB: Aug 26, 1934, 80 y.o.   MRN: [id number]  HPI    Review of Systems     Objective:   Physical Exam        Assessment & Plan:   BP Readings from Last 3 Encounters:  01/25/15 177/99  01/11/15 160/90  01/08/15 89/48   Uncontrolled.  Increase metoprolol xl from 25mg  to 50mg

## 2015-01-25 NOTE — Patient Instructions (Signed)
Increase Metoprolol to 50 mg daily and return for BP check in 2 weeks.

## 2015-01-25 NOTE — Progress Notes (Signed)
Pre visit review using our clinic review tool, if applicable. No additional management support is needed unless otherwise documented below in the visit note.  Patient in for BP check  Medications changed via telephone conversation due to HBP.

## 2015-01-29 ENCOUNTER — Ambulatory Visit: Payer: Medicare Other | Admitting: Podiatry

## 2015-01-30 DIAGNOSIS — H40011 Open angle with borderline findings, low risk, right eye: Secondary | ICD-10-CM | POA: Diagnosis not present

## 2015-01-30 DIAGNOSIS — H04412 Chronic dacryocystitis of left lacrimal passage: Secondary | ICD-10-CM | POA: Diagnosis not present

## 2015-01-30 DIAGNOSIS — H401123 Primary open-angle glaucoma, left eye, severe stage: Secondary | ICD-10-CM | POA: Diagnosis not present

## 2015-01-30 DIAGNOSIS — Z961 Presence of intraocular lens: Secondary | ICD-10-CM | POA: Diagnosis not present

## 2015-01-31 ENCOUNTER — Other Ambulatory Visit: Payer: Self-pay

## 2015-01-31 MED ORDER — METOPROLOL SUCCINATE ER 50 MG PO TB24: 50.0000 mg | ORAL_TABLET | Freq: Every day | ORAL | Status: DC

## 2015-02-08 ENCOUNTER — Ambulatory Visit (INDEPENDENT_AMBULATORY_CARE_PROVIDER_SITE_OTHER): Payer: Medicare Other | Admitting: Family

## 2015-02-08 VITALS — BP 177/90 | HR 81

## 2015-02-08 DIAGNOSIS — I10 Essential (primary) hypertension: Secondary | ICD-10-CM

## 2015-02-08 NOTE — Progress Notes (Signed)
reviewed

## 2015-02-08 NOTE — Progress Notes (Signed)
Pre visit review using our clinic review tool, if applicable. No additional management support is needed unless otherwise documented below in the visit note.  Patient in for Blood Pressure check.   Blood Pressure = 177/90  Patient did not take BP medication this am. Per M. Osullivan Have patient to return in 1 week for BP check. Reminder patient to take medication prior to appointment.

## 2015-02-14 ENCOUNTER — Ambulatory Visit (INDEPENDENT_AMBULATORY_CARE_PROVIDER_SITE_OTHER): Payer: Medicare Other | Admitting: Family Medicine

## 2015-02-14 ENCOUNTER — Encounter: Payer: Self-pay | Admitting: Family Medicine

## 2015-02-14 ENCOUNTER — Emergency Department (HOSPITAL_BASED_OUTPATIENT_CLINIC_OR_DEPARTMENT_OTHER)
Admission: EM | Admit: 2015-02-14 | Discharge: 2015-02-14 | Discharge status: Against Medical Advice | Payer: Medicare Other | Attending: Emergency Medicine | Admitting: Emergency Medicine

## 2015-02-14 ENCOUNTER — Ambulatory Visit: Payer: Medicare Other

## 2015-02-14 VITALS — BP 195/95 | HR 118

## 2015-02-14 VITALS — BP 188/86 | HR 81 | Temp 98.1°F | Wt 136.0 lb

## 2015-02-14 DIAGNOSIS — I10 Essential (primary) hypertension: Secondary | ICD-10-CM | POA: Diagnosis not present

## 2015-02-14 DIAGNOSIS — E119 Type 2 diabetes mellitus without complications: Secondary | ICD-10-CM | POA: Diagnosis not present

## 2015-02-14 MED ORDER — METOPROLOL SUCCINATE ER 100 MG PO TB24: 100.0000 mg | ORAL_TABLET | Freq: Every day | ORAL | Status: DC

## 2015-02-14 NOTE — Assessment & Plan Note (Signed)
con't diovan  Inc toprol 100 mg daily rto 2 week s or sooner prn

## 2015-02-14 NOTE — Progress Notes (Signed)
Pre visit review using our clinic review tool, if applicable. No additional management support is needed unless otherwise documented below in the visit note.  Patient in for BP check. BP =189/96 Pulse 87, Recheck 195/95 Pulse 118  Per Lendon Collar patient placed in room and had her lie down for a while then rechecked her BP. BP = 192/82 pulse = 80. Reported to E.Saguier who is Doc of the day covering for M. O'sullivan who patient states she has changed to.  Advised to see if patient could be placed on another providers schedule. One  provider had opening but not until after lunch. Did not feel that patient needed to wait that long due to elevated BP.  After sharing this with E.Saguier  Advised to take patient to ED. Discussed with patient who currently has no other symptoms.  Patient taken to ED advised check in why patient needed to be seen.  Documented BP's and given to patient.

## 2015-02-14 NOTE — Progress Notes (Signed)
Pre visit review using our clinic review tool, if applicable. No additional management support is needed unless otherwise documented below in the visit note. 

## 2015-02-14 NOTE — Patient Instructions (Signed)
Hypertension Hypertension, commonly called high blood pressure, is when the force of blood pumping through your arteries is too strong. Your arteries are the blood vessels that carry blood from your heart throughout your body. A blood pressure reading consists of a higher number over a lower number, such as 110/72. The higher number (systolic) is the pressure inside your arteries when your heart pumps. The lower number (diastolic) is the pressure inside your arteries when your heart relaxes. Ideally you want your blood pressure below 120/80. Hypertension forces your heart to work harder to pump blood. Your arteries may become narrow or stiff. Having untreated or uncontrolled hypertension can cause heart attack, stroke, kidney disease, and other problems. RISK FACTORS Some risk factors for high blood pressure are controllable. Others are not.  Risk factors you cannot control include:   Race. You may be at higher risk if you are African American.  Age. Risk increases with age.  Gender. Men are at higher risk than women before age 45 years. After age 65, women are at higher risk than men. Risk factors you can control include:  Not getting enough exercise or physical activity.  Being overweight.  Getting too much fat, sugar, calories, or salt in your diet.  Drinking too much alcohol. SIGNS AND SYMPTOMS Hypertension does not usually cause signs or symptoms. Extremely high blood pressure (hypertensive crisis) may cause headache, anxiety, shortness of breath, and nosebleed. DIAGNOSIS To check if you have hypertension, your health care provider will measure your blood pressure while you are seated, with your arm held at the level of your heart. It should be measured at least twice using the same arm. Certain conditions can cause a difference in blood pressure between your right and left arms. A blood pressure reading that is higher than normal on one occasion does not mean that you need treatment. If  it is not clear whether you have high blood pressure, you may be asked to return on a different day to have your blood pressure checked again. Or, you may be asked to monitor your blood pressure at home for 1 or more weeks. TREATMENT Treating high blood pressure includes making lifestyle changes and possibly taking medicine. Living a healthy lifestyle can help lower high blood pressure. You may need to change some of your habits. Lifestyle changes may include:  Following the DASH diet. This diet is high in fruits, vegetables, and whole grains. It is low in salt, red meat, and added sugars.  Keep your sodium intake below 2,300 mg per day.  Getting at least 30-45 minutes of aerobic exercise at least 4 times per week.  Losing weight if necessary.  Not smoking.  Limiting alcoholic beverages.  Learning ways to reduce stress. Your health care provider may prescribe medicine if lifestyle changes are not enough to get your blood pressure under control, and if one of the following is true:  You are 18-59 years of age and your systolic blood pressure is above 140.  You are 60 years of age or older, and your systolic blood pressure is above 150.  Your diastolic blood pressure is above 90.  You have diabetes, and your systolic blood pressure is over 140 or your diastolic blood pressure is over 90.  You have kidney disease and your blood pressure is above 140/90.  You have heart disease and your blood pressure is above 140/90. Your personal target blood pressure may vary depending on your medical conditions, your age, and other factors. HOME CARE INSTRUCTIONS    Have your blood pressure rechecked as directed by your health care provider.   Take medicines only as directed by your health care provider. Follow the directions carefully. Blood pressure medicines must be taken as prescribed. The medicine does not work as well when you skip doses. Skipping doses also puts you at risk for  problems.  Do not smoke.   Monitor your blood pressure at home as directed by your health care provider. SEEK MEDICAL CARE IF:   You think you are having a reaction to medicines taken.  You have recurrent headaches or feel dizzy.  You have swelling in your ankles.  You have trouble with your vision. SEEK IMMEDIATE MEDICAL CARE IF:  You develop a severe headache or confusion.  You have unusual weakness, numbness, or feel faint.  You have severe chest or abdominal pain.  You vomit repeatedly.  You have trouble breathing. MAKE SURE YOU:   Understand these instructions.  Will watch your condition.  Will get help right away if you are not doing well or get worse.   This information is not intended to replace advice given to you by your health care provider. Make sure you discuss any questions you have with your health care provider.   Document Released: 02/09/2005 Document Revised: 06/26/2014 Document Reviewed: 12/02/2012 Elsevier Interactive Patient Education 2016 [employer]  

## 2015-02-14 NOTE — Progress Notes (Signed)
Patient ID: Mary Flynn, female    DOB: April 09, 1934  Age: 80 y.o. MRN: [id number]    Subjective:  Subjective HPI Mary Flynn presents for f/u bp.  bp has been running high.  No headaches , no cp or visual changes.   Pt came in for bp check and it was found to be high.   Review of Systems  Constitutional: Negative for diaphoresis, appetite change, fatigue and unexpected weight change.  Eyes: Negative for pain, redness and visual disturbance.  Respiratory: Negative for cough, chest tightness, shortness of breath and wheezing.   Cardiovascular: Negative for chest pain, palpitations and leg swelling.  Endocrine: Negative for cold intolerance, heat intolerance, polydipsia, polyphagia and polyuria.  Genitourinary: Negative for dysuria, frequency and difficulty urinating.  Neurological: Negative for dizziness, light-headedness, numbness and headaches.    History Past Medical History  Diagnosis Date  . Diabetes mellitus   . Hyperlipidemia   . Hypertension   . Left rib fracture 02/18/2014    She has past surgical history that includes Appendectomy; surgical sterilization (40 years ago); and Refractive surgery.   Her family history includes Diabetes in her mother.She reports that she has never smoked. She has never used smokeless tobacco. She reports that she does not drink alcohol or use illicit drugs.  Current Outpatient Prescriptions on File Prior to Visit  Medication Sig Dispense Refill  . aspirin EC 81 MG tablet Take 81 mg by mouth daily.    Marland Kitchen atorvastatin (LIPITOR) 10 MG tablet Take 1 tablet by mouth  every day 90 tablet 1  . b complex vitamins tablet Take 1 tablet by mouth daily.    . Blood Glucose Monitoring Suppl (ONE TOUCH ULTRA 2) W/DEVICE KIT Use to test blood sugar 2 times daily as instructed. Dx code: E11.49 1 each 0  . gabapentin (NEURONTIN) 300 MG capsule Take 1 capsule by mouth 3  times daily 270 capsule 1  . glimepiride (AMARYL) 4 MG tablet Take 1 tablet by mouth two  times  daily 180 tablet 1  . glucose blood (ONE TOUCH ULTRA TEST) test strip Use to test blood sugar 2 times daily as instructed. Dx code: E11.49 200 each 3  . Insulin Detemir (LEVEMIR FLEXPEN) 100 UNIT/ML Pen Inject 14 Units into the skin at bedtime. (Patient taking differently: Inject 18 Units into the skin at bedtime. ) 15 mL 3  . insulin lispro (HUMALOG KWIKPEN) 100 UNIT/ML KiwkPen Inject 4 units at breakfast, 6 units at lunch and 4 units at dinner. 15 mL 1  . Insulin Pen Needle 32G X 4 MM MISC Use 3 per day 100 each 3  . meloxicam (MOBIC) 15 MG tablet Take 1 tablet (15 mg total) by mouth daily. 30 tablet 2  . metFORMIN (GLUCOPHAGE) 1000 MG tablet TAKE 1 TABLET BY MOUTH TWICE DAILY WITH MEALS 60 tablet 3  . Niacin CR 1000 MG TBCR 1 po bid (Patient taking differently: 1 po daily) 60 each 5  . ONE TOUCH ULTRA TEST test strip Test two times daily 200 each 1  . pantoprazole (PROTONIX) 40 MG tablet Take 1 tablet (40 mg total) by mouth daily. 90 tablet 1  . polyethylene glycol powder (GLYCOLAX/MIRALAX) powder Take 17 g by mouth daily. 1530 g 1  . traMADol (ULTRAM) 50 MG tablet Take 1 tablet (50 mg total) by mouth every 6 (six) hours as needed. 180 tablet 1  . valsartan (DIOVAN) 320 MG tablet Take 1 tablet by mouth  daily 90 tablet 1  . baclofen (  LIORESAL) 20 MG tablet TAKE 1 TABLET BY MOUTH THREE TIMES DAILY (Patient not taking: Reported on 02/14/2015) 270 tablet 0  . cyclobenzaprine (FLEXERIL) 10 MG tablet Take 10 mg by mouth 3 (three) times daily as needed for muscle spasms. Reported on 02/14/2015     No current facility-administered medications on file prior to visit.     Objective:  Objective Physical Exam  Constitutional: She is oriented to person, place, and time. She appears well-developed and well-nourished.  HENT:  Head: Normocephalic and atraumatic.  Eyes: Conjunctivae and EOM are normal.  Neck: Normal range of motion. Neck supple. No JVD present. Carotid bruit is not present. No  thyromegaly present.  Cardiovascular: Normal rate, regular rhythm and normal heart sounds.   No murmur heard. Pulmonary/Chest: Effort normal and breath sounds normal. No respiratory distress. She has no wheezes. She has no rales. She exhibits no tenderness.  Musculoskeletal: She exhibits no edema.  Neurological: She is alert and oriented to person, place, and time.  Psychiatric: She has a normal mood and affect. Her behavior is normal. Judgment and thought content normal.   BP 188/86 mmHg  Pulse 81  Temp(Src) 98.1 F (36.7 C) (Oral)  Wt 136 lb (61.689 kg)  SpO2 98% Wt Readings from Last 3 Encounters:  02/14/15 136 lb (61.689 kg)  02/14/15 132 lb (59.875 kg)  01/11/15 133 lb 6.4 oz (60.51 kg)     Lab Results  Component Value Date   WBC 9.6 09/28/2013   HGB 12.1 09/28/2013   HCT 37.9 09/28/2013   PLT 237.0 09/28/2013   GLUCOSE 139* 11/29/2014   CHOL 108 05/14/2014   TRIG 89.0 05/14/2014   HDL 47.60 05/14/2014   LDLCALC 43 05/14/2014   ALT 21 09/25/2014   AST 22 09/25/2014   NA 138 11/29/2014   K 4.1 11/29/2014   CL 100 11/29/2014   CREATININE 0.60 11/29/2014   BUN 10 11/29/2014   CO2 27 11/29/2014   TSH 4.43 10/17/2014   HGBA1C 9.1* 09/25/2014   MICROALBUR 7.2* 05/22/2014    No results found.   Assessment & Plan:  Plan I have discontinued Ms. Porzio's metoprolol succinate. I am also having her start on metoprolol succinate. Additionally, I am having her maintain her aspirin EC, Niacin CR, b complex vitamins, cyclobenzaprine, baclofen, glucose blood, ONE TOUCH ULTRA 2, traMADol, Insulin Detemir, gabapentin, valsartan, atorvastatin, glimepiride, metFORMIN, meloxicam, polyethylene glycol powder, pantoprazole, Insulin Pen Needle, insulin lispro, and ONE TOUCH ULTRA TEST.  Meds ordered this encounter  Medications  . metoprolol succinate (TOPROL-XL) 100 MG 24 hr tablet    Sig: Take 1 tablet (100 mg total) by mouth daily. Take with or immediately following a meal.     Dispense:  90 tablet    Refill:  3    Problem List Items Addressed This Visit      Unprioritized   HTN (hypertension) - Primary    con't diovan  Inc toprol 100 mg daily rto 2 week s or sooner prn      Relevant Medications   metoprolol succinate (TOPROL-XL) 100 MG 24 hr tablet      Follow-up: Return in about 4 weeks (around 03/14/2015), or if symptoms worsen or fail to improve, for hypertension.  Garnet Koyanagi, DO

## 2015-02-14 NOTE — ED Notes (Signed)
With start of triage,pt states she was brought to ED by nurse from PCP office in the building-states she was here for BP check then brought to ED-pt unsure why she was brought to ED-states PCP has been adjusted her BP meds x 3-4 weeks-no report was given to ED nurse or EDP-I called [facility] with LPN who stated pt was brought to ED per PA instruction due to elevated BP-then spoke with Percell Miller PA who stated pt was brought to ED due to no available appts for pt to be seen in the office and pt was sent to ED for elevated BP and tachycardia-I advised him no one had called EDP to give report, pt unsure why she was brought to ED and does not want to be seen in ED-stated he would put me on hold to talk with Dr Lawson Radar I was on hold Dr Etter Sjogren called in and spoke with EDP [facility]-EDP repors that pt was offered a 1pm appt and refused-discussed with pt again PCP appt vs ED visit-she does not want to be seen in ED-I called PCP office and was given 1:15pm appt for pt-pt notified-states she and husband will leave and return for 1:15p appt with Dr Shella Spearing pleasant-NAD-steady gait

## 2015-03-12 ENCOUNTER — Ambulatory Visit (INDEPENDENT_AMBULATORY_CARE_PROVIDER_SITE_OTHER): Payer: Medicare Other | Admitting: Family Medicine

## 2015-03-12 ENCOUNTER — Encounter: Payer: Self-pay | Admitting: Family Medicine

## 2015-03-12 ENCOUNTER — Telehealth: Payer: Self-pay | Admitting: Family Medicine

## 2015-03-12 VITALS — BP 152/90 | HR 80 | Temp 98.7°F | Ht 59.0 in | Wt 134.0 lb

## 2015-03-12 DIAGNOSIS — M62838 Other muscle spasm: Secondary | ICD-10-CM

## 2015-03-12 DIAGNOSIS — IMO0002 Reserved for concepts with insufficient information to code with codable children: Secondary | ICD-10-CM

## 2015-03-12 DIAGNOSIS — I10 Essential (primary) hypertension: Secondary | ICD-10-CM

## 2015-03-12 DIAGNOSIS — L97509 Non-pressure chronic ulcer of other part of unspecified foot with unspecified severity: Secondary | ICD-10-CM

## 2015-03-12 DIAGNOSIS — L98491 Non-pressure chronic ulcer of skin of other sites limited to breakdown of skin: Secondary | ICD-10-CM | POA: Diagnosis not present

## 2015-03-12 DIAGNOSIS — E1151 Type 2 diabetes mellitus with diabetic peripheral angiopathy without gangrene: Secondary | ICD-10-CM

## 2015-03-12 DIAGNOSIS — N393 Stress incontinence (female) (male): Secondary | ICD-10-CM | POA: Diagnosis not present

## 2015-03-12 DIAGNOSIS — E1165 Type 2 diabetes mellitus with hyperglycemia: Secondary | ICD-10-CM

## 2015-03-12 DIAGNOSIS — E11621 Type 2 diabetes mellitus with foot ulcer: Secondary | ICD-10-CM | POA: Diagnosis not present

## 2015-03-12 DIAGNOSIS — L84 Corns and callosities: Secondary | ICD-10-CM

## 2015-03-12 LAB — LIPID PANEL
CHOL/HDL RATIO: 3
CHOLESTEROL: 124 mg/dL (ref 0–200)
HDL: 49.5 mg/dL (ref >39.00)
LDL CALC: 35 mg/dL (ref 0–99)
NonHDL: 74.69
Triglycerides: 197 mg/dL — ABNORMAL HIGH (ref 0.0–149.0)
VLDL: 39.4 mg/dL (ref 0.0–40.0)

## 2015-03-12 LAB — POCT URINALYSIS DIPSTICK
Bilirubin, UA: NEGATIVE
KETONES UA: NEGATIVE
Leukocytes, UA: NEGATIVE
Nitrite, UA: NEGATIVE
PROTEIN UA: NEGATIVE
RBC UA: NEGATIVE
SPEC GRAV UA: 1.015
UROBILINOGEN UA: 0.2
pH, UA: 6

## 2015-03-12 LAB — COMPREHENSIVE METABOLIC PANEL
ALT: 17 U/L (ref 0–35)
AST: 16 U/L (ref 0–37)
Albumin: 4.2 g/dL (ref 3.5–5.2)
Alkaline Phosphatase: 66 U/L (ref 39–117)
BILIRUBIN TOTAL: 0.9 mg/dL (ref 0.2–1.2)
BUN: 11 mg/dL (ref 6–23)
CHLORIDE: 96 meq/L (ref 96–112)
CO2: 31 meq/L (ref 19–32)
CREATININE: 0.6 mg/dL (ref 0.40–1.20)
Calcium: 9.4 mg/dL (ref 8.4–10.5)
GFR: 102.65 mL/min (ref >60.00)
GLUCOSE: 193 mg/dL — AB (ref 70–99)
Potassium: 4.1 mEq/L (ref 3.5–5.1)
SODIUM: 135 meq/L (ref 135–145)
Total Protein: 6.8 g/dL (ref 6.0–8.3)

## 2015-03-12 LAB — HEMOGLOBIN A1C: Hgb A1c MFr Bld: 8.3 % — ABNORMAL HIGH (ref 4.6–6.5)

## 2015-03-12 MED ORDER — TIZANIDINE HCL 4 MG PO TABS: 4.0000 mg | ORAL_TABLET | Freq: Four times a day (QID) | ORAL | Status: DC | PRN

## 2015-03-12 NOTE — Telephone Encounter (Signed)
Noted  KP 

## 2015-03-12 NOTE — Telephone Encounter (Signed)
Caller name: Veyda   Relationship to patient: Self   Can be reached: [phone]  Reason for call: pt says that she would like to get her lab results mailed to her at home.

## 2015-03-12 NOTE — Progress Notes (Signed)
Pre visit review using our clinic review tool, if applicable. No additional management support is needed unless otherwise documented below in the visit note. 

## 2015-03-12 NOTE — Patient Instructions (Addendum)

## 2015-03-13 ENCOUNTER — Telehealth: Payer: Self-pay | Admitting: Family Medicine

## 2015-03-13 DIAGNOSIS — L84 Corns and callosities: Secondary | ICD-10-CM | POA: Insufficient documentation

## 2015-03-13 NOTE — Assessment & Plan Note (Addendum)
con't diovan Slightly high today-- she missed a few doses of med Recheck

## 2015-03-13 NOTE — Assessment & Plan Note (Signed)
F/u endo  

## 2015-03-13 NOTE — Assessment & Plan Note (Signed)
F/u podiatry

## 2015-03-13 NOTE — Progress Notes (Signed)
Patient ID: Mary Flynn, female    DOB: May 03, 1934  Age: 81 y.o. MRN: [id number]    Subjective:  Subjective HPI Mary Flynn presents for f/u bp .  No cp., sob or palp.  She is also struggling with urinary incontinence and extremely dry skin on feet especially but she is seeing podiatry.   Review of Systems  Constitutional: Negative for diaphoresis, appetite change, fatigue and unexpected weight change.  Eyes: Negative for pain, redness and visual disturbance.  Respiratory: Negative for cough, chest tightness, shortness of breath and wheezing.   Cardiovascular: Negative for chest pain, palpitations and leg swelling.  Endocrine: Negative for cold intolerance, heat intolerance, polydipsia, polyphagia and polyuria.  Genitourinary: Positive for frequency and difficulty urinating. Negative for dysuria.  Neurological: Negative for dizziness, light-headedness, numbness and headaches.    History Past Medical History  Diagnosis Date  . Diabetes mellitus   . Hyperlipidemia   . Hypertension   . Left rib fracture 02/18/2014    She has past surgical history that includes Appendectomy; surgical sterilization (40 years ago); and Refractive surgery.   Her family history includes Diabetes in her mother.She reports that she has never smoked. She has never used smokeless tobacco. She reports that she does not drink alcohol or use illicit drugs.  Current Outpatient Prescriptions on File Prior to Visit  Medication Sig Dispense Refill  . aspirin EC 81 MG tablet Take 81 mg by mouth daily.    Marland Kitchen atorvastatin (LIPITOR) 10 MG tablet Take 1 tablet by mouth  every day 90 tablet 1  . b complex vitamins tablet Take 1 tablet by mouth daily.    . Blood Glucose Monitoring Suppl (ONE TOUCH ULTRA 2) W/DEVICE KIT Use to test blood sugar 2 times daily as instructed. Dx code: E11.49 1 each 0  . cyclobenzaprine (FLEXERIL) 10 MG tablet Take 10 mg by mouth 3 (three) times daily as needed for muscle spasms. Reported on  02/14/2015    . gabapentin (NEURONTIN) 300 MG capsule Take 1 capsule by mouth 3  times daily 270 capsule 1  . glimepiride (AMARYL) 4 MG tablet Take 1 tablet by mouth two  times daily 180 tablet 1  . glucose blood (ONE TOUCH ULTRA TEST) test strip Use to test blood sugar 2 times daily as instructed. Dx code: E11.49 200 each 3  . Insulin Detemir (LEVEMIR FLEXPEN) 100 UNIT/ML Pen Inject 14 Units into the skin at bedtime. (Patient taking differently: Inject 18 Units into the skin at bedtime. ) 15 mL 3  . insulin lispro (HUMALOG KWIKPEN) 100 UNIT/ML KiwkPen Inject 4 units at breakfast, 6 units at lunch and 4 units at dinner. 15 mL 1  . Insulin Pen Needle 32G X 4 MM MISC Use 3 per day 100 each 3  . meloxicam (MOBIC) 15 MG tablet Take 1 tablet (15 mg total) by mouth daily. 30 tablet 2  . metFORMIN (GLUCOPHAGE) 1000 MG tablet TAKE 1 TABLET BY MOUTH TWICE DAILY WITH MEALS 60 tablet 3  . metoprolol succinate (TOPROL-XL) 100 MG 24 hr tablet Take 1 tablet (100 mg total) by mouth daily. Take with or immediately following a meal. 90 tablet 3  . Niacin CR 1000 MG TBCR 1 po bid (Patient taking differently: 1 po daily) 60 each 5  . ONE TOUCH ULTRA TEST test strip Test two times daily 200 each 1  . pantoprazole (PROTONIX) 40 MG tablet Take 1 tablet (40 mg total) by mouth daily. 90 tablet 1  . polyethylene glycol powder (GLYCOLAX/MIRALAX)  powder Take 17 g by mouth daily. 1530 g 1  . traMADol (ULTRAM) 50 MG tablet Take 1 tablet (50 mg total) by mouth every 6 (six) hours as needed. 180 tablet 1  . valsartan (DIOVAN) 320 MG tablet Take 1 tablet by mouth  daily 90 tablet 1  . baclofen (LIORESAL) 20 MG tablet TAKE 1 TABLET BY MOUTH THREE TIMES DAILY (Patient not taking: Reported on 03/12/2015) 270 tablet 0   No current facility-administered medications on file prior to visit.     Objective:  Objective Physical Exam  Constitutional: She is oriented to person, place, and time. She appears well-developed and  well-nourished.  HENT:  Head: Normocephalic and atraumatic.  Eyes: Conjunctivae and EOM are normal.  Neck: Normal range of motion. Neck supple. No JVD present. Carotid bruit is not present. No thyromegaly present.  Cardiovascular: Normal rate, regular rhythm and normal heart sounds.   No murmur heard. Pulmonary/Chest: Effort normal and breath sounds normal. No respiratory distress. She has no wheezes. She has no rales. She exhibits no tenderness.  Musculoskeletal: She exhibits no edema.  Neurological: She is alert and oriented to person, place, and time.  Skin: Skin is dry.  Psychiatric: She has a normal mood and affect.  Nursing note and vitals reviewed.  BP 152/90 mmHg  Pulse 80  Temp(Src) 98.7 F (37.1 C) (Oral)  Ht '4\' 11"'$  (1.499 m)  Wt 134 lb (60.782 kg)  BMI 27.05 kg/m2  SpO2 98% Wt Readings from Last 3 Encounters:  03/12/15 134 lb (60.782 kg)  02/14/15 136 lb (61.689 kg)  02/14/15 132 lb (59.875 kg)     Lab Results  Component Value Date   WBC 9.6 09/28/2013   HGB 12.1 09/28/2013   HCT 37.9 09/28/2013   PLT 237.0 09/28/2013   GLUCOSE 193* 03/12/2015   CHOL 124 03/12/2015   TRIG 197.0* 03/12/2015   HDL 49.50 03/12/2015   LDLCALC 35 03/12/2015   ALT 17 03/12/2015   AST 16 03/12/2015   NA 135 03/12/2015   K 4.1 03/12/2015   CL 96 03/12/2015   CREATININE 0.60 03/12/2015   BUN 11 03/12/2015   CO2 31 03/12/2015   TSH 4.43 10/17/2014   HGBA1C 8.3* 03/12/2015   MICROALBUR 7.2* 05/22/2014   -- UA-- + glucose----  Glucose running high0 she is overdue to see endo No results found.   Assessment & Plan:  Plan I am having Ms. Bahe start on tiZANidine. I am also having her maintain her aspirin EC, Niacin CR, b complex vitamins, cyclobenzaprine, baclofen, glucose blood, ONE TOUCH ULTRA 2, traMADol, Insulin Detemir, gabapentin, valsartan, atorvastatin, glimepiride, metFORMIN, meloxicam, polyethylene glycol powder, pantoprazole, Insulin Pen Needle, insulin lispro, ONE  TOUCH ULTRA TEST, metoprolol succinate, and oxybutynin.  Meds ordered this encounter  Medications  . oxybutynin (OXYTROL) 3.9 MG/24HR    Sig: Place 1 patch onto the skin every 3 (three) days.  Marland Kitchen tiZANidine (ZANAFLEX) 4 MG tablet    Sig: Take 1 tablet (4 mg total) by mouth every 6 (six) hours as needed for muscle spasms.    Dispense:  90 tablet    Refill:  0    Problem List Items Addressed This Visit      Unprioritized   URINARY INCONTINENCE, STRESS, FEMALE    Frequency of urine-- probably due to elevated glucose and glucosuria although pt had incontinence prior to this Check labs Control blood glucose      Relevant Medications   oxybutynin (OXYTROL) 3.9 MG/24HR   Pre-ulcerative corn  or callous    F/u podiatry       HTN (hypertension)    con't diovan Slightly high today-- she missed a few doses of med Recheck       Relevant Orders   Comp Met (CMET) (Completed)   Hemoglobin A1c (Completed)   Lipid panel (Completed)   POCT urinalysis dipstick (Completed)   DM (diabetes mellitus) type II uncontrolled, periph vascular disorder (HCC)    F/u endo       Other Visit Diagnoses    Callous ulcer, limited to breakdown of skin ([place])    -  Primary    Relevant Orders    Ambulatory referral to Podiatry    Comp Met (CMET) (Completed)    Hemoglobin A1c (Completed)    Lipid panel (Completed)    POCT urinalysis dipstick (Completed)    Type 2 diabetes mellitus with foot ulcer, without long-term current use of insulin (HCC)        Relevant Orders    Comp Met (CMET) (Completed)    Hemoglobin A1c (Completed)    Lipid panel (Completed)    POCT urinalysis dipstick (Completed)    Fructosamine    Ambulatory referral to diabetic education    Muscle spasm        Relevant Medications    tiZANidine (ZANAFLEX) 4 MG tablet       Follow-up: Return in about 3 months (around 06/10/2015), or if symptoms worsen or fail to improve, for hypertension, hyperlipidemia.  Garnet Koyanagi, DO

## 2015-03-13 NOTE — Assessment & Plan Note (Signed)
Frequency of urine-- probably due to elevated glucose and glucosuria although pt had incontinence prior to this Check labs Control blood glucose

## 2015-03-13 NOTE — Telephone Encounter (Signed)
Caller name: Roshni Relation to pt: Self  Call back [place]:  Reason for call: Pt came in office wanting to speak with Maudie Mercury about her lab results, pt was worried and had a few questions about it. Pt also wanted to know about her last rx giving to her (pt did not remember name of rx -states it is for her to sleep -had rx for 23 days) would like to know if she can get 3 month of the med that was given to her for that issue. Pt would like to be called in the afternoon on 03-14-15.  Please advise.

## 2015-03-13 NOTE — Assessment & Plan Note (Signed)
con't atorvastatin Check labs 

## 2015-03-14 ENCOUNTER — Other Ambulatory Visit: Payer: Self-pay | Admitting: Family Medicine

## 2015-03-14 ENCOUNTER — Ambulatory Visit: Payer: Self-pay

## 2015-03-14 LAB — FRUCTOSAMINE: FRUCTOSAMINE: 325 umol/L — AB (ref 190–270)

## 2015-03-15 ENCOUNTER — Other Ambulatory Visit: Payer: Self-pay | Admitting: Family Medicine

## 2015-03-15 ENCOUNTER — Other Ambulatory Visit: Payer: Self-pay | Admitting: Family

## 2015-03-15 NOTE — Telephone Encounter (Signed)
See labs 

## 2015-03-15 NOTE — Telephone Encounter (Signed)
Patient is requesting lab results. Please advise    KP

## 2015-03-16 ENCOUNTER — Other Ambulatory Visit: Payer: Self-pay | Admitting: Family Medicine

## 2015-03-16 DIAGNOSIS — R32 Unspecified urinary incontinence: Secondary | ICD-10-CM

## 2015-03-16 DIAGNOSIS — N3941 Urge incontinence: Secondary | ICD-10-CM

## 2015-03-16 MED ORDER — OXYBUTYNIN 3.9 MG/24HR TD PTTW: 1.0000 | MEDICATED_PATCH | TRANSDERMAL | Status: DC

## 2015-03-18 ENCOUNTER — Telehealth: Payer: Self-pay | Admitting: *Deleted

## 2015-03-18 ENCOUNTER — Other Ambulatory Visit: Payer: Self-pay

## 2015-03-18 ENCOUNTER — Other Ambulatory Visit: Payer: Self-pay | Admitting: Family Medicine

## 2015-03-18 ENCOUNTER — Other Ambulatory Visit: Payer: Self-pay | Admitting: Endocrinology

## 2015-03-18 ENCOUNTER — Ambulatory Visit: Payer: Self-pay | Admitting: Endocrinology

## 2015-03-18 DIAGNOSIS — Z794 Long term (current) use of insulin: Principal | ICD-10-CM

## 2015-03-18 DIAGNOSIS — N393 Stress incontinence (female) (male): Secondary | ICD-10-CM

## 2015-03-18 DIAGNOSIS — E1165 Type 2 diabetes mellitus with hyperglycemia: Secondary | ICD-10-CM

## 2015-03-18 DIAGNOSIS — R29898 Other symptoms and signs involving the musculoskeletal system: Secondary | ICD-10-CM

## 2015-03-18 DIAGNOSIS — M629 Disorder of muscle, unspecified: Secondary | ICD-10-CM

## 2015-03-18 MED ORDER — CYCLOBENZAPRINE HCL 10 MG PO TABS: 10.0000 mg | ORAL_TABLET | Freq: Three times a day (TID) | ORAL | Status: DC | PRN

## 2015-03-18 MED ORDER — MIRABEGRON ER 50 MG PO TB24: 50.0000 mg | ORAL_TABLET | Freq: Every day | ORAL | Status: DC

## 2015-03-18 MED ORDER — MELOXICAM 15 MG PO TABS: 15.0000 mg | ORAL_TABLET | Freq: Every day | ORAL | Status: DC

## 2015-03-18 NOTE — Telephone Encounter (Signed)
Mary Flynn is requesting a referral to the [facility] to help her with her diet and teach her what she should be eating. Please advise.

## 2015-03-18 NOTE — Telephone Encounter (Signed)
Faxed refill request for Meloxicam.  Dr. Milinda Pointer states refill to complete 1 year.  Orders to [facility].

## 2015-03-18 NOTE — Telephone Encounter (Signed)
This has been done but she still needs appointment to see me

## 2015-03-19 ENCOUNTER — Other Ambulatory Visit: Payer: Self-pay

## 2015-03-19 ENCOUNTER — Telehealth: Payer: Self-pay

## 2015-03-19 MED ORDER — OXYBUTYNIN CHLORIDE ER 5 MG PO TB24: 5.0000 mg | ORAL_TABLET | Freq: Every day | ORAL | Status: DC

## 2015-03-19 MED ORDER — TIZANIDINE HCL 4 MG PO TABS: ORAL_TABLET | ORAL | Status: DC

## 2015-03-19 NOTE — Telephone Encounter (Signed)
Patient walked in and stated Dr.Lowne sent the Mybertrigue and the medication is too expensive and the insurance company will not cover the Cyclobenzaprine. Discussed with Dr.Lowne and she said to send Oxybutynin ER 5 qd and Zanaflex 4 mg and send them both for an 90 day supply to [facility].    KP

## 2015-03-20 ENCOUNTER — Other Ambulatory Visit: Payer: Self-pay

## 2015-03-20 MED ORDER — OXYBUTYNIN CHLORIDE 5 MG PO TABS: 5.0000 mg | ORAL_TABLET | Freq: Every day | ORAL | Status: DC

## 2015-03-20 MED ORDER — OXYBUTYNIN CHLORIDE 5 MG PO TABS: 5.0000 mg | ORAL_TABLET | Freq: Two times a day (BID) | ORAL | Status: DC

## 2015-03-20 NOTE — Telephone Encounter (Signed)
Patient walked in again and stated that the Oxybutynin was incorrect and she needed the plain 5 mg, the ER will be $300, cancel the ER and the Rx faxed, I confirmed with the pharmcya nd this one will be free.   KP

## 2015-03-22 ENCOUNTER — Encounter: Payer: Self-pay | Admitting: Medical

## 2015-03-22 ENCOUNTER — Ambulatory Visit: Payer: Self-pay | Admitting: Skilled Nursing Facility1

## 2015-03-22 ENCOUNTER — Ambulatory Visit (INDEPENDENT_AMBULATORY_CARE_PROVIDER_SITE_OTHER): Payer: Medicare Other | Admitting: Medical

## 2015-03-22 ENCOUNTER — Other Ambulatory Visit (INDEPENDENT_AMBULATORY_CARE_PROVIDER_SITE_OTHER): Payer: Medicare Other

## 2015-03-22 VITALS — BP 150/80 | HR 81 | Temp 98.2°F | Ht 59.0 in | Wt 134.5 lb

## 2015-03-22 DIAGNOSIS — R05 Cough: Secondary | ICD-10-CM

## 2015-03-22 DIAGNOSIS — J0100 Acute maxillary sinusitis, unspecified: Secondary | ICD-10-CM | POA: Diagnosis not present

## 2015-03-22 DIAGNOSIS — R059 Cough, unspecified: Secondary | ICD-10-CM

## 2015-03-22 DIAGNOSIS — Z794 Long term (current) use of insulin: Secondary | ICD-10-CM

## 2015-03-22 DIAGNOSIS — M791 Myalgia, unspecified site: Secondary | ICD-10-CM

## 2015-03-22 DIAGNOSIS — I10 Essential (primary) hypertension: Secondary | ICD-10-CM | POA: Diagnosis not present

## 2015-03-22 DIAGNOSIS — E1165 Type 2 diabetes mellitus with hyperglycemia: Secondary | ICD-10-CM | POA: Diagnosis not present

## 2015-03-22 DIAGNOSIS — J029 Acute pharyngitis, unspecified: Secondary | ICD-10-CM | POA: Diagnosis not present

## 2015-03-22 LAB — COMPREHENSIVE METABOLIC PANEL
ALK PHOS: 51 U/L (ref 39–117)
ALT: 17 U/L (ref 0–35)
AST: 18 U/L (ref 0–37)
Albumin: 4.2 g/dL (ref 3.5–5.2)
BILIRUBIN TOTAL: 0.9 mg/dL (ref 0.2–1.2)
BUN: 13 mg/dL (ref 6–23)
CO2: 29 mEq/L (ref 19–32)
CREATININE: 0.63 mg/dL (ref 0.40–1.20)
Calcium: 9.3 mg/dL (ref 8.4–10.5)
Chloride: 98 mEq/L (ref 96–112)
GFR: 97.03 mL/min (ref >60.00)
GLUCOSE: 191 mg/dL — AB (ref 70–99)
Potassium: 4.1 mEq/L (ref 3.5–5.1)
SODIUM: 135 meq/L (ref 135–145)
TOTAL PROTEIN: 6.5 g/dL (ref 6.0–8.3)

## 2015-03-22 LAB — HEMOGLOBIN A1C: HEMOGLOBIN A1C: 8.3 % — AB (ref 4.6–6.5)

## 2015-03-22 LAB — POCT INFLUENZA A/B
Influenza A, POC: NEGATIVE
Influenza B, POC: NEGATIVE

## 2015-03-22 LAB — POCT RAPID STREP A (OFFICE): RAPID STREP A SCREEN: NEGATIVE

## 2015-03-22 MED ORDER — AZITHROMYCIN 250 MG PO TABS: ORAL_TABLET | ORAL | Status: DC

## 2015-03-22 MED ORDER — FLUTICASONE PROPIONATE 50 MCG/ACT NA SUSP: 2.0000 | Freq: Every day | NASAL | Status: DC

## 2015-03-22 MED ORDER — BENZONATATE 100 MG PO CAPS: 100.0000 mg | ORAL_CAPSULE | Freq: Three times a day (TID) | ORAL | Status: DC | PRN

## 2015-03-22 MED FILL — FLUTICASONE PROP 50 MCG SPR: 50 | 30 days supply | Qty: 16 | Fill #0

## 2015-03-22 MED FILL — AZITHROMYCIN 250 MG TABLET: 250 | 5 days supply | Qty: 6 | Fill #0

## 2015-03-22 MED FILL — BENZONATATE 100 MG CAPSULE: 100 | 7 days supply | Qty: 21 | Fill #0

## 2015-03-22 NOTE — Patient Instructions (Addendum)
You appear to have a sinus infection. I am prescribing azithromycin antibiotic for the infection. To help with the nasal congestion I prescribed flonase nasal steroid. For your associated cough, I prescribed cough medicine benzonatate.  For sore throat. Antibiotic should help as well.  For htn check bp daily and document reading. Call us in one week with bp reading. You may need increase on current dose or additional htn med.   Rest, hydrate, tylenol for fever.  Follow up in 7 days or as needed.

## 2015-03-22 NOTE — Progress Notes (Signed)
Pre visit review using our clinic review tool, if applicable. No additional management support is needed unless otherwise documented below in the visit note. 

## 2015-03-22 NOTE — Progress Notes (Signed)
Subjective:    Patient ID: Mary Flynn, female    DOB: Oct 23, 1934, 81 y.o.   MRN: [id number]  HPI   Pt in with some cough,  nasal congestion and body aches x 1 wk. Pt has some fibromyalgia.  Pt has some mucous when she coughs.    Pt states cough is still present. Pt has sore throat. Recently and in the past. Pt has seen ENT has seen her and did  Find problem with her throat.  Pt does discuss her fibromyalgia pain and she states in past massages helped.  Pt bp elevated today. But no cardiac or neurologic signs or symptoms. Pt on diovan and toprol. HCTZ per pt in past had caused her to have low NA.  My check was 150/80. About 20 minutes after MA checked. No cardiac or neurologic signs or symptoms.        Review of Systems  Constitutional: Negative for fever, chills and fatigue.  HENT: Positive for congestion, sinus pressure and sneezing. Negative for ear discharge and ear pain.   Respiratory: Positive for cough. Negative for chest tightness, shortness of breath and wheezing.   Cardiovascular: Negative for chest pain and palpitations.  Gastrointestinal: Negative for nausea, abdominal pain, diarrhea, constipation, blood in stool and abdominal distention.  Genitourinary: Negative for dysuria and difficulty urinating.  Musculoskeletal: Positive for myalgias. Negative for back pain.  Skin: Negative for rash.  Neurological: Negative for dizziness and headaches.  Hematological: Negative for adenopathy. Does not bruise/bleed easily.  Psychiatric/Behavioral: Negative for behavioral problems and confusion.    Past Medical History  Diagnosis Date  . Diabetes mellitus   . Hyperlipidemia   . Hypertension   . Left rib fracture 02/18/2014    Social History   Social History  . Marital Status: Married    Spouse Name: Aaron Boeh  . Number of Children: 2  . Years of Education: College   Occupational History  . housewife    Social History Main Topics  . Smoking status: Never  Smoker   . Smokeless tobacco: Never Used  . Alcohol Use: No  . Drug Use: No  . Sexual Activity: Not Currently   Other Topics Concern  . Not on file   Social History Narrative   Patient lives at home with spouse.   Caffeine Use: rarely    Past Surgical History  Procedure Laterality Date  . Appendectomy    . Surgical sterilization  40 years ago  . Refractive surgery      Left Eye    Family History  Problem Relation Age of Onset  . Depression    . Cancer      laryngeal  . Diabetes    . Diabetes Mother     Allergies  Allergen Reactions  . Penicillins Hives  . Sulfa Antibiotics Hives  . Sulfonamide Derivatives     Current Outpatient Prescriptions on File Prior to Visit  Medication Sig Dispense Refill  . aspirin EC 81 MG tablet Take 81 mg by mouth daily.    Marland Kitchen atorvastatin (LIPITOR) 10 MG tablet Take 1 tablet by mouth  every day 90 tablet 1  . b complex vitamins tablet Take 1 tablet by mouth daily.    . baclofen (LIORESAL) 20 MG tablet TAKE 1 TABLET BY MOUTH THREE TIMES DAILY 270 tablet 0  . Blood Glucose Monitoring Suppl (ONE TOUCH ULTRA 2) W/DEVICE KIT Use to test blood sugar 2 times daily as instructed. Dx code: E11.49 1 each 0  . gabapentin (  NEURONTIN) 300 MG capsule Take 1 capsule by mouth 3  times daily 270 capsule 1  . glimepiride (AMARYL) 4 MG tablet Take 1 tablet by mouth two  times daily 180 tablet 1  . glucose blood (ONE TOUCH ULTRA TEST) test strip Use to test blood sugar 2 times daily as instructed. Dx code: E11.49 200 each 3  . Insulin Detemir (LEVEMIR FLEXPEN) 100 UNIT/ML Pen Inject 14 Units into the skin at bedtime. (Patient taking differently: Inject 18 Units into the skin at bedtime. ) 15 mL 3  . insulin lispro (HUMALOG KWIKPEN) 100 UNIT/ML KiwkPen Inject 4 units at breakfast, 6 units at lunch and 4 units at dinner. 15 mL 1  . Insulin Pen Needle 32G X 4 MM MISC Use 3 per day 100 each 3  . meloxicam (MOBIC) 15 MG tablet Take 1 tablet (15 mg total) by mouth  daily. 30 tablet 8  . metFORMIN (GLUCOPHAGE) 1000 MG tablet Take 1 tablet by mouth  twice a day with meals 180 tablet 1  . metoprolol succinate (TOPROL-XL) 100 MG 24 hr tablet Take 1 tablet (100 mg total) by mouth daily. Take with or immediately following a meal. 90 tablet 3  . Niacin CR 1000 MG TBCR 1 po bid (Patient taking differently: 1 po daily) 60 each 5  . ONE TOUCH ULTRA TEST test strip Test two times daily 200 each 1  . oxybutynin (DITROPAN) 5 MG tablet Take 1 tablet (5 mg total) by mouth 2 (two) times daily. 180 tablet 3  . pantoprazole (PROTONIX) 40 MG tablet Take 1 tablet (40 mg total) by mouth daily. 90 tablet 1  . polyethylene glycol powder (GLYCOLAX/MIRALAX) powder Using measuring cap mix  17gm in water and drink  daily 1530 g 1  . tiZANidine (ZANAFLEX) 4 MG tablet Take 1 tablet by mouth  every 6 hours as needed for muscle spasm(s) 180 tablet 0  . traMADol (ULTRAM) 50 MG tablet Take 1 tablet (50 mg total) by mouth every 6 (six) hours as needed. 180 tablet 1  . valsartan (DIOVAN) 320 MG tablet Take 1 tablet by mouth  daily 90 tablet 1   No current facility-administered medications on file prior to visit.    BP 173/88 mmHg  Pulse 81  Temp(Src) 98.2 F (36.8 C) (Oral)  Ht '4\' 11"'$  (1.499 m)  Wt 134 lb 8 oz (61.009 kg)  BMI 27.15 kg/m2  SpO2 100%       Objective:   Physical Exam   General  Mental Status - Alert. General Appearance - Well groomed. Not in acute distress.  Skin Rashes- No Rashes.  HEENT Head- Normal. Ear Auditory Canal - Left- Normal. Right - Normal.Tympanic Membrane- Left- Normal. Right- Normal. Eye Sclera/Conjunctiva- Left- Normal. Right- Normal. Nose & Sinuses Nasal Mucosa- Left-  Boggy and Congested. Right-  Boggy and  Congested.Bilateral  No maxillary pressure but no  frontal sinus pressure. Mouth & Throat Lips: Upper Lip- Normal: no dryness, cracking, pallor, cyanosis, or vesicular eruption. Lower Lip-Normal: no dryness, cracking, pallor,  cyanosis or vesicular eruption. Buccal Mucosa- Bilateral- No Aphthous ulcers. Oropharynx- No Discharge or Erythema. Tonsils: Characteristics- Bilateral- No Erythema or Congestion. Size/Enlargement- Bilateral- No enlargement. Discharge- bilateral-None.  Neck Neck- Supple. No Masses.   Chest and Lung Exam Auscultation: Breath Sounds:-Clear even and unlabored.  Cardiovascular Auscultation:Rythm- Regular, rate and rhythm. Murmurs & Other Heart Sounds:Ausculatation of the heart reveal- No Murmurs.  Lymphatic Head & Neck General Head & Neck Lymphatics: Bilateral: Description- No Localized lymphadenopathy.  Neurologic Cranial Nerve exam:- CN III-XII intact(No nystagmus), symmetric smile. Strength:- 5/5 equal and symmetric strength both upper and lower extremities.     Assessment & Plan:   Flu and rapid strep were negative.  You appear to have a sinus infection. I am prescribing azithromycin antibiotic for the infection. To help with the nasal congestion I prescribed flonase nasal steroid. For your associated cough, I prescribed cough medicine benzonatate.  For sore throat. Antibiotic should help as well.  For htn check bp daily and document reading. Call us in one week with bp reading. You may need increase on current dose or additional htn med.   Rest, hydrate, tylenol for fever.  Follow up in 7 days or as needed.

## 2015-03-26 ENCOUNTER — Ambulatory Visit (INDEPENDENT_AMBULATORY_CARE_PROVIDER_SITE_OTHER): Payer: Medicare Other | Admitting: Endocrinology

## 2015-03-26 ENCOUNTER — Telehealth: Payer: Self-pay

## 2015-03-26 ENCOUNTER — Encounter: Payer: Self-pay | Admitting: Endocrinology

## 2015-03-26 ENCOUNTER — Other Ambulatory Visit: Payer: Self-pay | Admitting: Family Medicine

## 2015-03-26 VITALS — BP 142/92 | HR 74 | Temp 97.8°F | Resp 14 | Ht 59.0 in | Wt 132.8 lb

## 2015-03-26 DIAGNOSIS — Z794 Long term (current) use of insulin: Secondary | ICD-10-CM | POA: Diagnosis not present

## 2015-03-26 DIAGNOSIS — I10 Essential (primary) hypertension: Secondary | ICD-10-CM

## 2015-03-26 DIAGNOSIS — E871 Hypo-osmolality and hyponatremia: Secondary | ICD-10-CM | POA: Diagnosis not present

## 2015-03-26 DIAGNOSIS — E1165 Type 2 diabetes mellitus with hyperglycemia: Secondary | ICD-10-CM

## 2015-03-26 MED ORDER — LOSARTAN POTASSIUM 50 MG PO TABS: 50.0000 mg | ORAL_TABLET | Freq: Every day | ORAL | Status: DC

## 2015-03-26 MED ORDER — AMLODIPINE BESYLATE 5 MG PO TABS: 5.0000 mg | ORAL_TABLET | Freq: Every day | ORAL | Status: DC

## 2015-03-26 MED FILL — AMLODIPINE BESYLATE 5 MG TA: 5 | 30 days supply | Qty: 30 | Fill #0

## 2015-03-26 NOTE — Telephone Encounter (Signed)
Patient walked in and stated that she seen Percell Miller last week and her BP was elevated, she stated that he told her to continue to check it and if it was still high that she should come in and inform Dr.Lowne. When seh checked her BP at home it was 195/101 and pulse was 80, she wanted to know if you wanted to change or prescribe another medication, she dropped off her formulary ad her BP in the office was 180/92 pulse 89, she is asymptomatic, No SOB, No CP, No dizziness and no blurred vision. She said she feels fine.   She also stated Tizanidine is not working she would to switch back to Baclofen.  Please advise    KP

## 2015-03-26 NOTE — Telephone Encounter (Signed)
Pt returned your call.    CB: [phone]

## 2015-03-26 NOTE — Telephone Encounter (Signed)
Pharmacy aware to cancel Rx for Losartan, the patient has already called them. Norvasc 5 mg has been faxed.    KP

## 2015-03-26 NOTE — Telephone Encounter (Signed)
Losartan, hctz, fosamax, calcium were all stopped by Dr.Wert,  she can not take the Losartan due to her cough. Please advise    KP

## 2015-03-26 NOTE — Telephone Encounter (Signed)
norvasc 5 mg #30  1 po qd, 2 refills Ov 2-3 weeks

## 2015-03-26 NOTE — Telephone Encounter (Signed)
Add losartan 50 mg 1 po qd #30  --- ov 2 weeks

## 2015-03-26 NOTE — Telephone Encounter (Signed)
Mary Flynn mention if you can send a few pills of her prescription to the pharmacy down stairs ([place]), she mentioned that the rest she would want it at [facility] but since she is needing some now and [facility] takes like 8 to 10 days to receive, she would like a few down  Stairs.

## 2015-03-26 NOTE — Patient Instructions (Signed)
Levemir 17 units  More sugar tests at dinner and bedtime

## 2015-03-26 NOTE — Progress Notes (Signed)
Patient ID: Mary Flynn, female   DOB: 17-Dec-1934, 81 y.o.   MRN: [id number]   Reason for Appointment: Diabetes follow-up   History of Present Illness   Diagnosis: Type 2 DIABETES MELITUS, date of diagnosis:   1995    Previous history: She has been on various regimens of hypoglycemic drugs over the last several years including Actos, Amaryl and Januvia. Because of cost her Januvia was stopped and Actos was stopped because of potential weight gain She was initially started on Levemir insulin because of fasting hyperglycemia in 2009 and the dose has been adjusted periodically In the last year she is also quite mealtime coverage because of postprandial hyperglycemia despite usually eating a low carbohydrate diet  Insulin regimen: Levemir 16 units hs;  Novolog 4-7 before meals based on meal size    Recent history:  Her A1c continues to be persistently over 8%  Previously she was recommended Invokana but this is too expensive for her and she will not take it Also she was told to try taking LEVEMIR in the morning instead of evening to help with daytime hyperglycemia but she thinks her readings were overall higher and she went back to the bedtime dose  Current blood sugar patterns and problems identified:  FASTING blood sugars are quite variable recently but in the last few days somewhat high  She is still taking 16 units of Levemir even though she was told to try to 18 units previously  She appears to have somewhat better readings in the afternoons but is checking apparently  She did have readings over 200 at night before and after supper about 3 weeks ago but has not checked evening readings except once now  She says she is trying to cut back on portions and carbohydrates, trying to eat more salads and vegetables  She also things that when she is feeling depressed she will not eat much  She still has difficulty losing weight She is generally trying to be very active with walking  or other exercise  Hypoglycemia :  none  Oral hypoglycemic drugs: Amaryl, metformin  Side effects from medications: None.          Proper timing of medications in relation to meals: She is taking her NovoLog right after eating       Monitors blood glucose: Once a day.    Glucometer:  One Touch       Blood Glucose readings from meter download:   Mean values apply above for all meters except median for One Touch  PRE-MEAL Fasting Lunch Dinner Bedtime Overall  Glucose range: 83-208  81, 92   148, 206   173-261    Mean/median:  142     151   Meals: 3 meals per day. she is a vegetarian. Trying to get some protein at most meals including dairy products and tofu  Has Bfst 10 am,  lunch around 2 PM, dinner at 8 PM      Physical activity: exercise: walking 2 miles and Gym periodically       Last visit with dietitian: 2009   Wt Readings from Last 3 Encounters:  03/26/15 132 lb 12.8 oz (60.238 kg)  03/22/15 134 lb 8 oz (61.009 kg)  03/12/15 134 lb (60.782 kg)    LABS:  Lab Results  Component Value Date   HGBA1C 8.3* 03/22/2015   HGBA1C 8.3* 03/12/2015   HGBA1C 9.1* 09/25/2014   Lab Results  Component Value Date   MICROALBUR 7.2* 05/22/2014  LDLCALC 35 03/12/2015   CREATININE 0.63 03/22/2015       Medication List       This list is accurate as of: 03/26/15  9:11 PM.  Always use your most recent med list.               amLODipine 5 MG tablet  Commonly known as:  NORVASC  Take 1 tablet (5 mg total) by mouth daily.     aspirin EC 81 MG tablet  Take 81 mg by mouth daily.     atorvastatin 10 MG tablet  Commonly known as:  LIPITOR  Take 1 tablet by mouth  every day     azithromycin 250 MG tablet  Commonly known as:  ZITHROMAX  Take 2 tablets by mouth on day 1, followed by 1 tablet by mouth daily for 4 days.     b complex vitamins tablet  Take 1 tablet by mouth daily.     baclofen 20 MG tablet  Commonly known as:  LIORESAL  TAKE 1 TABLET BY MOUTH THREE TIMES  DAILY     benzonatate 100 MG capsule  Commonly known as:  TESSALON  Take 1 capsule (100 mg total) by mouth 3 (three) times daily as needed.     fluticasone 50 MCG/ACT nasal spray  Commonly known as:  FLONASE  Place 2 sprays into both nostrils daily.     gabapentin 300 MG capsule  Commonly known as:  NEURONTIN  Take 1 capsule by mouth 3  times daily     glimepiride 4 MG tablet  Commonly known as:  AMARYL  Take 1 tablet by mouth two  times daily     glucose blood test strip  Commonly known as:  ONE TOUCH ULTRA TEST  Use to test blood sugar 2 times daily as instructed. Dx code: E11.49     ONE TOUCH ULTRA TEST test strip  Generic drug:  glucose blood  Test two times daily     Insulin Detemir 100 UNIT/ML Pen  Commonly known as:  LEVEMIR FLEXPEN  Inject 14 Units into the skin at bedtime.     insulin lispro 100 UNIT/ML KiwkPen  Commonly known as:  HUMALOG KWIKPEN  Inject 4 units at breakfast, 6 units at lunch and 4 units at dinner.     Insulin Pen Needle 32G X 4 MM Misc  Use 3 per day     meloxicam 15 MG tablet  Commonly known as:  MOBIC  Take 1 tablet (15 mg total) by mouth daily.     metFORMIN 1000 MG tablet  Commonly known as:  GLUCOPHAGE  Take 1 tablet by mouth  twice a day with meals     metoprolol succinate 100 MG 24 hr tablet  Commonly known as:  TOPROL-XL  Take 1 tablet (100 mg total) by mouth daily. Take with or immediately following a meal.     Niacin CR 1000 MG Tbcr  1 po bid     ONE TOUCH ULTRA 2 w/Device Kit  Use to test blood sugar 2 times daily as instructed. Dx code: E11.49     oxybutynin 5 MG tablet  Commonly known as:  DITROPAN  Take 1 tablet (5 mg total) by mouth 2 (two) times daily.     pantoprazole 40 MG tablet  Commonly known as:  PROTONIX  Take 1 tablet (40 mg total) by mouth daily.     polyethylene glycol powder powder  Commonly known as:  GLYCOLAX/MIRALAX  Using measuring cap  mix  17gm in water and drink  daily     tiZANidine 4 MG  tablet  Commonly known as:  ZANAFLEX  Take 1 tablet by mouth  every 6 hours as needed for muscle spasm(s)     traMADol 50 MG tablet  Commonly known as:  ULTRAM  Take 1 tablet (50 mg total) by mouth every 6 (six) hours as needed.     valsartan 320 MG tablet  Commonly known as:  DIOVAN  Take 1 tablet by mouth  daily        Allergies:  Allergies  Allergen Reactions  . Ace Inhibitors Cough  . Losartan Cough  . Penicillins Hives  . Sulfa Antibiotics Hives  . Sulfonamide Derivatives     Past Medical History  Diagnosis Date  . Diabetes mellitus   . Hyperlipidemia   . Hypertension   . Left rib fracture 02/18/2014    Past Surgical History  Procedure Laterality Date  . Appendectomy    . Surgical sterilization  40 years ago  . Refractive surgery      Left Eye    Family History  Problem Relation Age of Onset  . Depression    . Cancer      laryngeal  . Diabetes    . Diabetes Mother     Social History:  reports that she has never smoked. She has never used smokeless tobacco. She reports that she does not drink alcohol or use illicit drugs.  Review of Systems:  She tends to give her rambling history  Hypertension:    On  Diovan 320 mg, metoprolol and amlodipine, followed by PCP Blood pressure appears to be significantly high again but she always gets anxious in the office She does not monitor at home regularly and blood pressure is followed by PCP  Hyponatremia: This is variable and unrelated to medications  Lab Results  Component Value Date   CREATININE 0.63 03/22/2015   BUN 13 03/22/2015   NA 135 03/22/2015   K 4.1 03/22/2015   CL 98 03/22/2015   CO2 29 03/22/2015     Lipids: Has had diabetic dyslipidemia,  well controlled and HDL is also fairly good  Lab Results  Component Value Date   CHOL 124 03/12/2015   HDL 49.50 03/12/2015   LDLCALC 35 03/12/2015   TRIG 197.0* 03/12/2015   CHOLHDL 3 03/12/2015    Neuropathy: She previously had sharp pains  in her lower legs but no numbness.still on gabapentin, no symptoms currently   Diabetic foot exam done in 9/16  Diabetic foot exam showed normal monofilament sensation in the toes and plantar surfaces, no skin lesions or ulcers on the feet and normal pedal pulses  She complains of feeling depressed but has not been given any treatment for this   She is also complaining about urinary incontinence and frequent urination at night despite trying oxybutynin  She has been regular with her eye exams annually    Examination:   BP 142/92 mmHg  Pulse 74  Temp(Src) 97.8 F (36.6 C)  Resp 14  Ht '4\' 11"'$  (1.499 m)  Wt 132 lb 12.8 oz (60.238 kg)  BMI 26.81 kg/m2  SpO2 96%  Body mass index is 26.81 kg/(m^2).     ASSESSMENT/ PLAN:   Diabetes type 2    See history of present illness for detailed discussion of  current management, blood sugar patterns and problems identified Her A1c continues to be higher and likelihood to be from postprandial hyperglycemia Also  of fasting readings are been variable and not clear why She now says that she is eating variable quantities of food based on her depression status May not be eating balanced meals at times Also has had periodically high readings especially 3 weeks ago including after supper Difficult to get a blood sugar pattern because of her not monitoring readings after her meals recently Generally she is compliant with diet and exercise regimen  Recommended checking her blood sugar more consistently after meals rather than just in the morning She will need to go up at least 1 unit on her Levemir to get fasting readings at least below 130 and this was discussed If blood sugars are high before supper consistently she will need Levemir twice a day or a different preparation Will discuss mealtime doses based on her blood sugar patterns on the next visit  Hypertension: Blood pressure is followed by PCP, usually high normal when checked here May  benefit from home blood pressure monitoring  Other active medical problems: She will review with PCP  Mild chronic hyponatremia: Will need fluid restriction of sodium is low again  Most likely she will need pharmacological treatment for depression and possibly urology referral for her incontinence; she does not want to take brand-name medications and this makes her treatment more difficult  Patient Instructions  Levemir 17 units  More sugar tests at dinner and bedtime     Counseling time on subjects discussed above is over 50% of today's 25 minute visit   Marinus Eicher 03/26/2015, 9:11 PM

## 2015-03-27 ENCOUNTER — Other Ambulatory Visit: Payer: Self-pay | Admitting: Family Medicine

## 2015-03-27 DIAGNOSIS — M545 Low back pain: Secondary | ICD-10-CM

## 2015-04-01 ENCOUNTER — Telehealth: Payer: Self-pay | Admitting: Family Medicine

## 2015-04-01 MED ORDER — AMLODIPINE BESYLATE 5 MG PO TABS: 5.0000 mg | ORAL_TABLET | Freq: Every day | ORAL | Status: DC

## 2015-04-01 MED ORDER — BACLOFEN 20 MG PO TABS: 20.0000 mg | ORAL_TABLET | Freq: Three times a day (TID) | ORAL | Status: DC

## 2015-04-01 NOTE — Telephone Encounter (Signed)
Caller name:Jaiya Relation to pt: self  Call back [place]: [facility]   Reason for call: Pt came in office requesting 90 days supply for Amlodipine Besylate 5 mg TA and for Baclofen 20 mg tablet for back pain. Please advise.

## 2015-04-01 NOTE — Telephone Encounter (Signed)
Both Med's have been faxed to [facility] per patient request.    KP

## 2015-04-03 ENCOUNTER — Encounter: Payer: Self-pay | Admitting: Physical Therapy

## 2015-04-03 ENCOUNTER — Ambulatory Visit: Payer: Medicare Other | Attending: Family Medicine | Admitting: Physical Therapy

## 2015-04-03 DIAGNOSIS — M797 Fibromyalgia: Secondary | ICD-10-CM | POA: Diagnosis not present

## 2015-04-03 DIAGNOSIS — M545 Low back pain, unspecified: Secondary | ICD-10-CM

## 2015-04-03 DIAGNOSIS — M542 Cervicalgia: Secondary | ICD-10-CM | POA: Diagnosis not present

## 2015-04-03 NOTE — Therapy (Signed)
[place] [address] [place], [place], [address] Phone: [phone]   Fax:  [phone]  Physical Therapy Evaluation  Patient Details  Name: Mary Flynn MRN: [id number] Date of Birth: 09-25-1934 Referring Provider: Etter Sjogren  Encounter Date: 04/03/2015      PT End of Session - 04/03/15 1629    Visit Number 1   Date for PT Re-Evaluation 06/01/15   PT Start Time 1513   PT Stop Time 1625   PT Time Calculation (min) 72 min   Activity Tolerance Patient tolerated treatment well   Behavior During Therapy [facility] for tasks assessed/performed      Past Medical History  Diagnosis Date  . Diabetes mellitus   . Hyperlipidemia   . Hypertension   . Left rib fracture 02/18/2014    Past Surgical History  Procedure Laterality Date  . Appendectomy    . Surgical sterilization  40 years ago  . Refractive surgery      Left Eye    There were no vitals filed for this visit.  Visit Diagnosis:  Neck pain - Plan: PT plan of care cert/re-cert  Bilateral low back pain without sciatica - Plan: PT plan of care cert/re-cert  Fibromyalgia - Plan: PT plan of care cert/re-cert      Subjective Assessment - 04/03/15 1614    Subjective Patient with a report of two falls in the past 6 months with some increase of pain in the neck and the back   Limitations Lifting;House hold activities   Patient Stated Goals less pain   Currently in Pain? Yes   Pain Score 7    Pain Location Back  c/o pain in the legs, arm and neck as well   Pain Orientation Lower   Pain Descriptors / Indicators Aching;Sore;Tender;Tightness   Pain Type Chronic pain   Pain Onset More than a month ago   Pain Frequency Constant   Aggravating Factors  activity   Pain Relieving Factors rest   Effect of Pain on Daily Activities limited in all ADL's due to pain            [facility] PT Assessment - 04/03/15 0001    Assessment   Medical Diagnosis fibromyalgia, neck and  back pain   Referring Provider Low[place]   Onset Date/Surgical Date 03/27/15   Prior Therapy about 2 years ago for the same   Precautions   Precautions None   Balance Screen   Has the patient fallen in the past 6 months Yes   How many times? 2   Has the patient had a decrease in activity level because of a fear of falling?  No   Is the patient reluctant to leave their home because of a fear of falling?  No   Home Environment   Additional Comments she does light housework, reports that over the past year she has hired someone to help her   Prior Function   Level of Independence Independent   Leisure reports that she tries to walk daily   Posture/Postural Control   Posture Comments fwd head, rounded shoulders, decreased lordosis   ROM / Strength   AROM / PROM / Strength AROM;Strength   AROM   Overall AROM Comments AROM of the lumbar spine is decreased 50% with c/o pain in the low back, cervical ROM was decreased 50% with pain in the neck, her shoulder flexion was 120 and IR was 40 degrees with "tightness in the shoulders"  Strength   Overall Strength Comments UE and LE's are 3+/5    Flexibility   Soft Tissue Assessment /Muscle Length --  very tight calves, HS and piriformis mms   Palpation   Palpation comment she has spasms in the lumbar and cervical area, she has tenderness int he upper arms and ITB and calves   Ambulation/Gait   Gait Comments no assistive device, slow pace                   [facility] Adult PT Treatment/Exercise - 2015/04/21 0001    Manual Therapy   Manual Therapy Soft tissue mobilization;Passive ROM   Soft tissue mobilization STM of the calves   Passive ROM PROM of the LE's                  PT Short Term Goals - 04/21/15 1632    PT SHORT TERM GOAL #1   Title independent with a stretching routine   Time 2   Period Weeks   Status New           PT Long Term Goals - 04-21-2015 1632    PT LONG TERM GOAL #1   Title decrease pain 50%   Time  12   Period Weeks   Status New   PT LONG TERM GOAL #2   Title increase lumbar ROM 25%   Time 12   Period Weeks   Status New   PT LONG TERM GOAL #3   Title increase cervical ROM 25%   Time 12   Period Weeks   Status New   PT LONG TERM GOAL #4   Title be able to clean her house without increase of pain > 4/10   Time 12   Period Weeks   Status New               Plan - 04/21/2015 1629    Clinical Impression Statement Patient has had lumbar surgery in the past, has fibromyalgia, reports 2 falls in the past 6 months, reports increased pain and difficulty walking and with ADL's due to pain, has tight LE musculature and is very tender throughout   Pt will benefit from skilled therapeutic intervention in order to improve on the following deficits Decreased activity tolerance;Decreased mobility;Decreased range of motion;Decreased strength;Difficulty walking;Increased muscle spasms;Impaired flexibility;Postural dysfunction;Pain   Rehab Potential Good   PT Frequency 1x / week   PT Duration 8 weeks   PT Treatment/Interventions ADLs/Self Care Home Management;Electrical Stimulation;Moist Heat;Therapeutic exercise;Therapeutic activities;Ultrasound;Patient/family education;Manual techniques   PT Next Visit Plan She has responded well in the past to manual therapy and stretches   Consulted and Agree with Plan of Care Patient          G-Codes - 04-21-15 1634    Functional Assessment Tool Used foto 75% limitation   Functional Limitation Other PT primary   Other PT Primary Current Status [id number]) At least 60 percent but less than 80 percent impaired, limited or restricted   Other PT Primary Goal Status [id number]) At least 40 percent but less than 60 percent impaired, limited or restricted       Problem List Patient Active Problem List   Diagnosis Date Noted  . Pre-ulcerative corn or callous 03/13/2015  . HTN (hypertension) 01/11/2015  . DM type 2 with diabetic peripheral neuropathy ([place])  10/16/2014  . Left rib fracture 02/18/2014  . Skin infection 09/28/2013  . Otitis, externa, infective 09/28/2013  . Cough 01/11/2013  . Tinea cruris 07/27/2012  .  Urinary frequency 07/27/2012  . Eczema 03/08/2012  . Headache(784.0) 03/08/2012  . Arm pain, right 08/24/2011  . Depression 07/16/2011  . Compression fracture of L2 lumbar vertebra ([place]) 06/08/2011  . Chronic insomnia 04/26/2011  . PPD positive 04/17/2011  . URINARY INCONTINENCE, STRESS, FEMALE 04/29/2010  . MEMORY LOSS 12/23/2009  . Chronic bronchitis NEC 12/23/2009  . DIABETIC PERIPHERAL NEUROPATHY 06/28/2009  . Acute low back pain due to trauma 12/20/2008  . FIBROIDS, UTERUS 12/18/2008  . ABDOMINAL BLOATING 12/17/2008  . Constipation 01/24/2008  . GERD 01/23/2008  . SPRAIN&STRAIN OTHER SPECIFIED SITES KNEE&LEG 07/20/2007  . UTI 05/16/2007  . LACERATION OF FINGER 10/12/2006  . THYROID NODULE, RIGHT 08/05/2006  . DM (diabetes mellitus) type II uncontrolled, periph vascular disorder ([place]) 08/05/2006  . HYPERLIPIDEMIA 08/05/2006  . Essential hypertension 08/05/2006  . OSTEOPOROSIS NOS 08/05/2006    Sumner Boast., PT 04/03/2015, 4:41 PM  [place] [address] [place], [place], [address] Phone: [phone]   Fax:  [phone]  Name: Mary Flynn MRN: [id number] Date of Birth: 04-Jul-1934

## 2015-04-05 ENCOUNTER — Telehealth: Payer: Self-pay | Admitting: Endocrinology

## 2015-04-05 NOTE — Telephone Encounter (Signed)
Pt is going to [place] and if you agree can you write her a new Invokana rx so she can get it there. Also, can she get a copay card for the Invokana for one month before she gets it from [place].

## 2015-04-05 NOTE — Telephone Encounter (Signed)
Left message on voicemail to call office.  

## 2015-04-05 NOTE — Telephone Encounter (Signed)
Pt would like the Invokana for the 1st 30 days to be thru the copay card she would like the copay card and the invokana rx to be mailed to her

## 2015-04-05 NOTE — Telephone Encounter (Signed)
Please read message below and advise.  

## 2015-04-05 NOTE — Telephone Encounter (Signed)
Invokana 100 mg daily.  Not clear if she is going to pick this up or she wants it mailed

## 2015-04-05 NOTE — Telephone Encounter (Signed)
Mary Flynn pt.

## 2015-04-09 ENCOUNTER — Encounter: Payer: Self-pay | Admitting: Podiatry

## 2015-04-09 ENCOUNTER — Ambulatory Visit (INDEPENDENT_AMBULATORY_CARE_PROVIDER_SITE_OTHER): Payer: Medicare Other | Admitting: Podiatry

## 2015-04-09 DIAGNOSIS — R234 Changes in skin texture: Secondary | ICD-10-CM | POA: Diagnosis not present

## 2015-04-09 NOTE — Progress Notes (Signed)
She presents with her husband today concerned that she has a diabetic and has developed dry cracking skin. She states when the skin becomes dry and cracks open to the plantar aspect of the right second metatarsophalangeal joint she applies urea cream as well as Covan and a dressing. She states that seems to heal up. She's concerned about her heels and Y they are cracking. She states that they are not painful.  Objective: Vital signs are stable she is alert and oriented 3. Pulses are strongly palpable bilateral. Neurologic sensorium is intact. Deep tendon reflexes are intact. Cutaneous evaluation demonstrates supple well-hydrated cutis plantarly with exception of the heels which do demonstrate some skin fissures. The skin fissures however are not deep and do not demonstrate any type of infection.  Assessment: Diabetic dermopathy with skin fissures bilateral.  Plan: recommended that she use the urea cream on a regular basis and I will follow-up with her as needed on an emergent basis if necessary for the wound on the plantar aspect of her foot.

## 2015-04-11 ENCOUNTER — Ambulatory Visit: Payer: Medicare Other | Admitting: Physical Therapy

## 2015-04-11 ENCOUNTER — Encounter: Payer: Self-pay | Admitting: Physical Therapy

## 2015-04-11 DIAGNOSIS — M542 Cervicalgia: Secondary | ICD-10-CM | POA: Diagnosis not present

## 2015-04-11 DIAGNOSIS — M797 Fibromyalgia: Secondary | ICD-10-CM | POA: Diagnosis not present

## 2015-04-11 DIAGNOSIS — M545 Low back pain, unspecified: Secondary | ICD-10-CM

## 2015-04-11 NOTE — Therapy (Signed)
[facility], [place], [address] Phone: [phone]   Fax:  [phone]  Physical Therapy Treatment  Patient Details  Name: Mary Flynn MRN: [id number] Date of Birth: 05-21-1934 Referring Provider: Etter Sjogren  Encounter Date: 04/11/2015      PT End of Session - 04/11/15 1552    Visit Number 2   Date for PT Re-Evaluation 06/01/15   PT Start Time 1448   PT Stop Time 1556   PT Time Calculation (min) 68 min   Activity Tolerance Patient tolerated treatment well   Behavior During Therapy [facility] for tasks assessed/performed      Past Medical History  Diagnosis Date  . Diabetes mellitus   . Hyperlipidemia   . Hypertension   . Left rib fracture 02/18/2014    Past Surgical History  Procedure Laterality Date  . Appendectomy    . Surgical sterilization  40 years ago  . Refractive surgery      Left Eye    There were no vitals filed for this visit.  Visit Diagnosis:  Neck pain  Bilateral low back pain without sciatica  Fibromyalgia      Subjective Assessment - 04/11/15 1547    Subjective Patient reports that she has been having increased back pain, she is unsure of a cause, reports relief after being seen here but reports that the next day or so the pain will return   Currently in Pain? Yes   Pain Score 7    Pain Location Back   Pain Orientation Lower;Mid   Pain Descriptors / Indicators Aching;Sore;Spasm   Pain Type Chronic pain                         [facility] Adult PT Treatment/Exercise - 04/11/15 0001    Manual Therapy   Manual Therapy Soft tissue mobilization;Passive ROM   Soft tissue mobilization STM of the calves, thighs, back and neck   Passive ROM PROM of the LE's and trunk                  PT Short Term Goals - 04/11/15 1554    PT SHORT TERM GOAL #1   Title independent with a stretching routine   Status On-going           PT Long Term Goals -  04/03/15 1632    PT LONG TERM GOAL #1   Title decrease pain 50%   Time 12   Period Weeks   Status New   PT LONG TERM GOAL #2   Title increase lumbar ROM 25%   Time 12   Period Weeks   Status New   PT LONG TERM GOAL #3   Title increase cervical ROM 25%   Time 12   Period Weeks   Status New   PT LONG TERM GOAL #4   Title be able to clean her house without increase of pain > 4/10   Time 12   Period Weeks   Status New               Plan - 04/11/15 1553    Clinical Impression Statement Patient with significant tightness and spasms in the LE's and the back mms.  She is very tender.  She has tightness of the calves and HS as well as quads   PT Next Visit Plan continue with current plan   Consulted and Agree with Plan of Care Patient  Problem List Patient Active Problem List   Diagnosis Date Noted  . Pre-ulcerative corn or callous 03/13/2015  . HTN (hypertension) 01/11/2015  . DM type 2 with diabetic peripheral neuropathy ([place]) 10/16/2014  . Left rib fracture 02/18/2014  . Skin infection 09/28/2013  . Otitis, externa, infective 09/28/2013  . Cough 01/11/2013  . Tinea cruris 07/27/2012  . Urinary frequency 07/27/2012  . Eczema 03/08/2012  . Headache(784.0) 03/08/2012  . Arm pain, right 08/24/2011  . Depression 07/16/2011  . Compression fracture of L2 lumbar vertebra ([place]) 06/08/2011  . Chronic insomnia 04/26/2011  . PPD positive 04/17/2011  . URINARY INCONTINENCE, STRESS, FEMALE 04/29/2010  . MEMORY LOSS 12/23/2009  . Chronic bronchitis NEC 12/23/2009  . DIABETIC PERIPHERAL NEUROPATHY 06/28/2009  . Acute low back pain due to trauma 12/20/2008  . FIBROIDS, UTERUS 12/18/2008  . ABDOMINAL BLOATING 12/17/2008  . Constipation 01/24/2008  . GERD 01/23/2008  . SPRAIN&STRAIN OTHER SPECIFIED SITES KNEE&LEG 07/20/2007  . UTI 05/16/2007  . LACERATION OF FINGER 10/12/2006  . THYROID NODULE, RIGHT 08/05/2006  . DM (diabetes mellitus) type II uncontrolled,  periph vascular disorder ([place]) 08/05/2006  . HYPERLIPIDEMIA 08/05/2006  . Essential hypertension 08/05/2006  . OSTEOPOROSIS NOS 08/05/2006    Sumner Boast., PT 04/11/2015, 3:56 PM  [place] [address] [place], [place], [address] Phone: [phone]   Fax:  [phone]  Name: DEMECIA STROSNIDER MRN: [id number] Date of Birth: [birth year]

## 2015-04-12 ENCOUNTER — Ambulatory Visit (HOSPITAL_BASED_OUTPATIENT_CLINIC_OR_DEPARTMENT_OTHER)
Admission: RE | Admit: 2015-04-12 | Discharge: 2015-04-12 | Disposition: A | Discharge status: Home / Self Care | Payer: Medicare Other | Source: Ambulatory Visit | Attending: Family Medicine | Admitting: Family Medicine

## 2015-04-12 ENCOUNTER — Encounter: Payer: Self-pay | Admitting: Family Medicine

## 2015-04-12 ENCOUNTER — Ambulatory Visit (INDEPENDENT_AMBULATORY_CARE_PROVIDER_SITE_OTHER): Payer: Medicare Other | Admitting: Family Medicine

## 2015-04-12 VITALS — BP 135/79 | HR 79 | Temp 97.7°F | Resp 16 | Ht 59.0 in | Wt 131.8 lb

## 2015-04-12 DIAGNOSIS — R05 Cough: Secondary | ICD-10-CM | POA: Diagnosis not present

## 2015-04-12 DIAGNOSIS — R0789 Other chest pain: Secondary | ICD-10-CM | POA: Diagnosis not present

## 2015-04-12 DIAGNOSIS — R0781 Pleurodynia: Secondary | ICD-10-CM

## 2015-04-12 DIAGNOSIS — M5442 Lumbago with sciatica, left side: Secondary | ICD-10-CM | POA: Diagnosis not present

## 2015-04-12 NOTE — Progress Notes (Signed)
Pre visit review using our clinic review tool, if applicable. No additional management support is needed unless otherwise documented below in the visit note. 

## 2015-04-12 NOTE — Patient Instructions (Signed)

## 2015-04-14 NOTE — Progress Notes (Signed)
Patient ID: Mary Flynn, female    DOB: 01-07-1935  Age: 81 y.o. MRN: [id number]    Subjective:  Subjective HPI Mary Flynn presents for back pain.  No known injury.  Pt has hx back surgery with Dr Mary Flynn and she has contacted his office and they asked for an MRI before they see her.    Review of Systems  Constitutional: Negative for diaphoresis, appetite change, fatigue and unexpected weight change.  Eyes: Negative for pain, redness and visual disturbance.  Respiratory: Negative for cough, chest tightness, shortness of breath and wheezing.   Cardiovascular: Negative for chest pain, palpitations and leg swelling.  Endocrine: Negative for cold intolerance, heat intolerance, polydipsia, polyphagia and polyuria.  Genitourinary: Negative for dysuria, frequency and difficulty urinating.  Musculoskeletal: Positive for back pain.  Neurological: Negative for dizziness, light-headedness, numbness and headaches.    History Past Medical History  Diagnosis Date  . Diabetes mellitus   . Hyperlipidemia   . Hypertension   . Left rib fracture 02/18/2014    She has past surgical history that includes Appendectomy; surgical sterilization (40 years ago); and Refractive surgery.   Her family history includes Diabetes in her mother.She reports that she has never smoked. She has never used smokeless tobacco. She reports that she does not drink alcohol or use illicit drugs.  Current Outpatient Prescriptions on File Prior to Visit  Medication Sig Dispense Refill  . amLODipine (NORVASC) 5 MG tablet Take 1 tablet (5 mg total) by mouth daily. 90 tablet 1  . aspirin EC 81 MG tablet Take 81 mg by mouth daily.    Marland Kitchen atorvastatin (LIPITOR) 10 MG tablet Take 1 tablet by mouth  every day 90 tablet 1  . b complex vitamins tablet Take 1 tablet by mouth daily.    . baclofen (LIORESAL) 20 MG tablet Take 1 tablet (20 mg total) by mouth 3 (three) times daily. 270 tablet 0  . Blood Glucose Monitoring Suppl (ONE TOUCH  ULTRA 2) W/DEVICE KIT Use to test blood sugar 2 times daily as instructed. Dx code: E11.49 1 each 0  . fluticasone (FLONASE) 50 MCG/ACT nasal spray Place 2 sprays into both nostrils daily. 16 g 1  . gabapentin (NEURONTIN) 300 MG capsule Take 1 capsule by mouth 3  times daily 270 capsule 1  . glimepiride (AMARYL) 4 MG tablet Take 1 tablet by mouth two  times daily 180 tablet 1  . glucose blood (ONE TOUCH ULTRA TEST) test strip Use to test blood sugar 2 times daily as instructed. Dx code: E11.49 200 each 3  . Insulin Detemir (LEVEMIR FLEXPEN) 100 UNIT/ML Pen Inject 14 Units into the skin at bedtime. (Patient taking differently: Inject 16 Units into the skin at bedtime. ) 15 mL 3  . insulin lispro (HUMALOG KWIKPEN) 100 UNIT/ML KiwkPen Inject 4 units at breakfast, 6 units at lunch and 4 units at dinner. 15 mL 1  . Insulin Pen Needle 32G X 4 MM MISC Use 3 per day 100 each 3  . meloxicam (MOBIC) 15 MG tablet Take 1 tablet (15 mg total) by mouth daily. 30 tablet 8  . metFORMIN (GLUCOPHAGE) 1000 MG tablet Take 1 tablet by mouth  twice a day with meals 180 tablet 1  . metoprolol succinate (TOPROL-XL) 100 MG 24 hr tablet Take 1 tablet (100 mg total) by mouth daily. Take with or immediately following a meal. 90 tablet 3  . Niacin CR 1000 MG TBCR 1 po bid (Patient taking differently: 1 po  daily) 60 each 5  . ONE TOUCH ULTRA TEST test strip Test two times daily 200 each 1  . oxybutynin (DITROPAN) 5 MG tablet Take 1 tablet (5 mg total) by mouth 2 (two) times daily. 180 tablet 3  . pantoprazole (PROTONIX) 40 MG tablet Take 1 tablet (40 mg total) by mouth daily. 90 tablet 1  . polyethylene glycol powder (GLYCOLAX/MIRALAX) powder Using measuring cap mix  17gm in water and drink  daily 1530 g 1  . traMADol (ULTRAM) 50 MG tablet Take 1 tablet (50 mg total) by mouth every 6 (six) hours as needed. 180 tablet 1  . valsartan (DIOVAN) 320 MG tablet Take 1 tablet by mouth  daily 90 tablet 1  . benzonatate (TESSALON) 100 MG  capsule Take 1 capsule (100 mg total) by mouth 3 (three) times daily as needed. (Patient not taking: Reported on 04/12/2015) 21 capsule 0   No current facility-administered medications on file prior to visit.     Objective:  Objective Physical Exam  Constitutional: She is oriented to person, place, and time. She appears well-developed and well-nourished.  HENT:  Head: Normocephalic and atraumatic.  Eyes: Conjunctivae and EOM are normal.  Neck: Normal range of motion. Neck supple. No JVD present. Carotid bruit is not present. No thyromegaly present.  Cardiovascular: Normal rate, regular rhythm and normal heart sounds.   No murmur heard. Pulmonary/Chest: Effort normal and breath sounds normal. No respiratory distress. She has no wheezes. She has no rales. She exhibits no tenderness.  Musculoskeletal: She exhibits tenderness. She exhibits no edema.       Lumbar back: She exhibits decreased range of motion and tenderness.  Neurological: She is alert and oriented to person, place, and time.  Weakness in both legs L > r   Psychiatric: She has a normal mood and affect.  Nursing note and vitals reviewed.  BP 135/79 mmHg  Pulse 79  Temp(Src) 97.7 F (36.5 C) (Oral)  Resp 16  Ht '4\' 11"'$  (1.499 m)  Wt 131 lb 12.8 oz (59.784 kg)  BMI 26.61 kg/m2  SpO2 96% Wt Readings from Last 3 Encounters:  04/12/15 131 lb 12.8 oz (59.784 kg)  03/26/15 132 lb 12.8 oz (60.238 kg)  03/22/15 134 lb 8 oz (61.009 kg)     Lab Results  Component Value Date   WBC 9.6 09/28/2013   HGB 12.1 09/28/2013   HCT 37.9 09/28/2013   PLT 237.0 09/28/2013   GLUCOSE 191* 03/22/2015   CHOL 124 03/12/2015   TRIG 197.0* 03/12/2015   HDL 49.50 03/12/2015   LDLCALC 35 03/12/2015   ALT 17 03/22/2015   AST 18 03/22/2015   NA 135 03/22/2015   K 4.1 03/22/2015   CL 98 03/22/2015   CREATININE 0.63 03/22/2015   BUN 13 03/22/2015   CO2 29 03/22/2015   TSH 4.43 10/17/2014   HGBA1C 8.3* 03/22/2015   MICROALBUR 7.2*  05/22/2014    Dg Ribs Unilateral W/chest Left  04/12/2015  CLINICAL DATA:  Status post fall at home 7 months ago, persistent left chest wall discomfort with increased intensity over the past 3 weeks; no cardiopulmonary symptoms. EXAM: LEFT RIBS AND CHEST - 3+ VIEW COMPARISON:  PA and lateral chest x-ray of May 03, 2013 and rib detail series of February 15, 2014 FINDINGS: The lungs are well-expanded. There is scarring in the mid and lower lung zones which allowing for differences in positioning and radiographic technique is stable. There is no alveolar infiltrate, pleural effusion, or pneumothorax. The  mediastinum is normal in width. The heart and pulmonary vascularity are normal. Left rib detail views reveal no definite acute fractures. Specific attention to the left sixth and seventh ribs anteriorly reveals no abnormality today. IMPRESSION: 1. Stable bilateral pleuro parenchymal scarring. No alveolar pneumonia nor CHF. 2. No definite left rib fracture is observed. Given the worsening of the patient's symptoms, chest CT scanning is recommended. Electronically Signed   By: David  [place] M.D.   On: 04/12/2015 15:51     Assessment & Plan:  Plan I have discontinued Mary Flynn's tiZANidine and azithromycin. I am also having her maintain her aspirin EC, Niacin CR, b complex vitamins, glucose blood, ONE TOUCH ULTRA 2, traMADol, Insulin Detemir, pantoprazole, Insulin Pen Needle, insulin lispro, ONE TOUCH ULTRA TEST, metoprolol succinate, polyethylene glycol powder, gabapentin, metFORMIN, glimepiride, atorvastatin, valsartan, meloxicam, oxybutynin, fluticasone, benzonatate, baclofen, and amLODipine.  No orders of the defined types were placed in this encounter.    Problem List Items Addressed This Visit    None    Visit Diagnoses    Left-sided low back pain with left-sided sciatica    -  Primary    Relevant Orders    MR Lumbar Spine W Wo Contrast    DG Ribs Unilateral W/Chest Left (Completed)    Rib  pain on left side        Relevant Orders    DG Ribs Unilateral W/Chest Left (Completed)       Follow-up: Return if symptoms worsen or fail to improve.  Garnet Koyanagi, DO

## 2015-04-15 ENCOUNTER — Other Ambulatory Visit: Payer: Self-pay | Admitting: Family Medicine

## 2015-04-15 DIAGNOSIS — R0781 Pleurodynia: Secondary | ICD-10-CM

## 2015-04-16 ENCOUNTER — Telehealth: Payer: Self-pay | Admitting: Endocrinology

## 2015-04-16 ENCOUNTER — Ambulatory Visit: Payer: Self-pay | Admitting: Family Medicine

## 2015-04-16 ENCOUNTER — Other Ambulatory Visit: Payer: Self-pay | Admitting: *Deleted

## 2015-04-16 MED ORDER — CANAGLIFLOZIN 100 MG PO TABS: 100.0000 mg | ORAL_TABLET | Freq: Every day | ORAL | Status: DC

## 2015-04-16 NOTE — Telephone Encounter (Signed)
Co-pay card and prescription has been mailed to her, she has so many pharmacies listed that I had no idea which one to send it to.

## 2015-04-16 NOTE — Telephone Encounter (Signed)
Please see below, I do not see anything about her starting Invokana in your last note?

## 2015-04-16 NOTE — Telephone Encounter (Signed)
Pt needs [place] to call in invokana please and somehow get her the copay card so she can try it for 30 days before she purchases it from [place],

## 2015-04-16 NOTE — Telephone Encounter (Signed)
100 mg daily

## 2015-04-17 ENCOUNTER — Ambulatory Visit: Payer: Medicare Other | Admitting: Physical Therapy

## 2015-04-17 ENCOUNTER — Encounter: Payer: Self-pay | Admitting: Physical Therapy

## 2015-04-17 DIAGNOSIS — M545 Low back pain, unspecified: Secondary | ICD-10-CM

## 2015-04-17 DIAGNOSIS — M542 Cervicalgia: Secondary | ICD-10-CM

## 2015-04-17 DIAGNOSIS — M797 Fibromyalgia: Secondary | ICD-10-CM

## 2015-04-17 NOTE — Therapy (Signed)
[address] [place], [place], [address] Phone: [phone]   Fax:  [phone]  Physical Therapy Treatment  Patient Details  Name: Mary Flynn MRN: [id number] Date of Birth: [birth year] Referring Provider: Etter Sjogren  Encounter Date: 04/17/2015      PT End of Session - 04/17/15 1609    Visit Number 3   Date for PT Re-Evaluation 06/01/15   PT Start Time 1518   PT Stop Time 1620   PT Time Calculation (min) 62 min   Activity Tolerance Patient tolerated treatment well   Behavior During Therapy [facility] for tasks assessed/performed      Past Medical History  Diagnosis Date  . Diabetes mellitus   . Hyperlipidemia   . Hypertension   . Left rib fracture 02/18/2014    Past Surgical History  Procedure Laterality Date  . Appendectomy    . Surgical sterilization  40 years ago  . Refractive surgery      Left Eye    There were no vitals filed for this visit.  Visit Diagnosis:  Neck pain  Bilateral low back pain without sciatica  Fibromyalgia      Subjective Assessment - 04/17/15 1608    Subjective Patient reports that she is feeling a little better overall, less pain but still sore and reports that she has not been trying to walk due to nor feeling well   Currently in Pain? Yes   Pain Score 6    Pain Location Back   Pain Orientation Lower                         [facility] Adult PT Treatment/Exercise - 04/17/15 0001    Manual Therapy   Manual Therapy Soft tissue mobilization;Passive ROM   Soft tissue mobilization STM of the calves, thighs, back and neck   Passive ROM PROM of the LE's and trunk                  PT Short Term Goals - 04/17/15 1610    PT SHORT TERM GOAL #1   Title independent with a stretching routine   Status On-going           PT Long Term Goals - 04/03/15 1632    PT LONG TERM GOAL #1   Title decrease pain 50%   Time 12   Period Weeks   Status New    PT LONG TERM GOAL #2   Title increase lumbar ROM 25%   Time 12   Period Weeks   Status New   PT LONG TERM GOAL #3   Title increase cervical ROM 25%   Time 12   Period Weeks   Status New   PT LONG TERM GOAL #4   Title be able to clean her house without increase of pain > 4/10   Time 12   Period Weeks   Status New               Plan - 04/17/15 1609    Clinical Impression Statement Patient with very tight HS and calves. Also has some neural tension signs in the UE's   PT Next Visit Plan continue with current plan   Consulted and Agree with Plan of Care Patient        Problem List Patient Active Problem List   Diagnosis Date Noted  . Pre-ulcerative corn or callous 03/13/2015  . HTN (hypertension) 01/11/2015  .  DM type 2 with diabetic peripheral neuropathy ([place]) 10/16/2014  . Left rib fracture 02/18/2014  . Skin infection 09/28/2013  . Otitis, externa, infective 09/28/2013  . Cough 01/11/2013  . Tinea cruris 07/27/2012  . Urinary frequency 07/27/2012  . Eczema 03/08/2012  . Headache(784.0) 03/08/2012  . Arm pain, right 08/24/2011  . Depression 07/16/2011  . Compression fracture of L2 lumbar vertebra ([place]) 06/08/2011  . Chronic insomnia 04/26/2011  . PPD positive 04/17/2011  . URINARY INCONTINENCE, STRESS, FEMALE 04/29/2010  . MEMORY LOSS 12/23/2009  . Chronic bronchitis NEC 12/23/2009  . DIABETIC PERIPHERAL NEUROPATHY 06/28/2009  . Acute low back pain due to trauma 12/20/2008  . FIBROIDS, UTERUS 12/18/2008  . ABDOMINAL BLOATING 12/17/2008  . Constipation 01/24/2008  . GERD 01/23/2008  . SPRAIN&STRAIN OTHER SPECIFIED SITES KNEE&LEG 07/20/2007  . UTI 05/16/2007  . LACERATION OF FINGER 10/12/2006  . THYROID NODULE, RIGHT 08/05/2006  . DM (diabetes mellitus) type II uncontrolled, periph vascular disorder ([place]) 08/05/2006  . HYPERLIPIDEMIA 08/05/2006  . Essential hypertension 08/05/2006  . OSTEOPOROSIS NOS 08/05/2006    Sumner Boast.,  PT 04/17/2015, 4:11 PM  [address] [place], [place], [address] Phone: [phone]   Fax:  [phone]  Name: Mary Flynn MRN: [id number] Date of Birth: 1935-02-09

## 2015-04-21 ENCOUNTER — Ambulatory Visit (HOSPITAL_BASED_OUTPATIENT_CLINIC_OR_DEPARTMENT_OTHER)
Admission: RE | Admit: 2015-04-21 | Discharge: 2015-04-21 | Disposition: A | Discharge status: Home / Self Care | Payer: Medicare Other | Source: Ambulatory Visit | Attending: Family Medicine | Admitting: Family Medicine

## 2015-04-21 DIAGNOSIS — J984 Other disorders of lung: Secondary | ICD-10-CM | POA: Diagnosis not present

## 2015-04-21 DIAGNOSIS — M5126 Other intervertebral disc displacement, lumbar region: Secondary | ICD-10-CM | POA: Diagnosis not present

## 2015-04-21 DIAGNOSIS — M5442 Lumbago with sciatica, left side: Secondary | ICD-10-CM

## 2015-04-21 DIAGNOSIS — R0781 Pleurodynia: Secondary | ICD-10-CM | POA: Insufficient documentation

## 2015-04-21 DIAGNOSIS — I2510 Atherosclerotic heart disease of native coronary artery without angina pectoris: Secondary | ICD-10-CM | POA: Diagnosis not present

## 2015-04-21 DIAGNOSIS — I517 Cardiomegaly: Secondary | ICD-10-CM | POA: Insufficient documentation

## 2015-04-21 DIAGNOSIS — R918 Other nonspecific abnormal finding of lung field: Secondary | ICD-10-CM | POA: Insufficient documentation

## 2015-04-21 MED ORDER — GADOBENATE DIMEGLUMINE 529 MG/ML IV SOLN: 11.0000 mL | Freq: Once | INTRAVENOUS | Status: DC | PRN

## 2015-04-23 ENCOUNTER — Other Ambulatory Visit: Payer: Self-pay

## 2015-04-23 DIAGNOSIS — M5136 Other intervertebral disc degeneration, lumbar region: Secondary | ICD-10-CM

## 2015-04-23 DIAGNOSIS — M5126 Other intervertebral disc displacement, lumbar region: Secondary | ICD-10-CM

## 2015-04-24 ENCOUNTER — Ambulatory Visit: Payer: Medicare Other | Attending: Family Medicine | Admitting: Physical Therapy

## 2015-04-24 ENCOUNTER — Encounter: Payer: Self-pay | Admitting: Physical Therapy

## 2015-04-24 DIAGNOSIS — M797 Fibromyalgia: Secondary | ICD-10-CM

## 2015-04-24 DIAGNOSIS — M545 Low back pain, unspecified: Secondary | ICD-10-CM

## 2015-04-24 DIAGNOSIS — M542 Cervicalgia: Secondary | ICD-10-CM | POA: Insufficient documentation

## 2015-04-24 NOTE — Therapy (Signed)
[address] [place], [place], [address] Phone: [phone]   Fax:  [phone]  Physical Therapy Treatment  Patient Details  Name: Mary Flynn MRN: [id number] Date of Birth: 31-Mar-1934 Referring Provider: Etter Sjogren  Encounter Date: 04/24/2015      PT End of Session - 04/24/15 1534    Visit Number 4   Date for PT Re-Evaluation 06/01/15   PT Start Time 1430   PT Stop Time 1535   PT Time Calculation (min) 65 min   Activity Tolerance Patient tolerated treatment well   Behavior During Therapy [facility] for tasks assessed/performed      Past Medical History  Diagnosis Date  . Diabetes mellitus   . Hyperlipidemia   . Hypertension   . Left rib fracture 02/18/2014    Past Surgical History  Procedure Laterality Date  . Appendectomy    . Surgical sterilization  40 years ago  . Refractive surgery      Left Eye    There were no vitals filed for this visit.  Visit Diagnosis:  Neck pain  Bilateral low back pain without sciatica  Fibromyalgia      Subjective Assessment - 04/24/15 1428    Subjective (p) Had a recent MRI that shows dic bulges in the lumbar area, healed compression fractures of the T12 and L2 vertebrae with a left foraminal protrusion at L2-3   Currently in Pain? (p) Yes   Pain Score (p) 5                          [facility] Adult PT Treatment/Exercise - 04/24/15 0001    Manual Therapy   Manual Therapy Soft tissue mobilization;Passive ROM   Soft tissue mobilization STM of the calves, thighs, back and neck   Passive ROM PROM of the LE's and trunk                  PT Short Term Goals - 04/24/15 1538    PT SHORT TERM GOAL #1   Title independent with a stretching routine   Status Achieved           PT Long Term Goals - 04/03/15 1632    PT LONG TERM GOAL #1   Title decrease pain 50%   Time 12   Period Weeks   Status New   PT LONG TERM GOAL #2   Title  increase lumbar ROM 25%   Time 12   Period Weeks   Status New   PT LONG TERM GOAL #3   Title increase cervical ROM 25%   Time 12   Period Weeks   Status New   PT LONG TERM GOAL #4   Title be able to clean her house without increase of pain > 4/10   Time 12   Period Weeks   Status New               Plan - 04/24/15 1537    Clinical Impression Statement Patient with recent MRI, she will see neursurgeon in the next week.  We may try some traction.  The MRI showed bulges in the disc   PT Next Visit Plan continue with current plan  may try manual traction   Consulted and Agree with Plan of Care Patient        Problem List Patient Active Problem List   Diagnosis Date Noted  . Pre-ulcerative corn or callous 03/13/2015  .  HTN (hypertension) 01/11/2015  . DM type 2 with diabetic peripheral neuropathy ([place]) 10/16/2014  . Left rib fracture 02/18/2014  . Skin infection 09/28/2013  . Otitis, externa, infective 09/28/2013  . Cough 01/11/2013  . Tinea cruris 07/27/2012  . Urinary frequency 07/27/2012  . Eczema 03/08/2012  . Headache(784.0) 03/08/2012  . Arm pain, right 08/24/2011  . Depression 07/16/2011  . Compression fracture of L2 lumbar vertebra ([place]) 06/08/2011  . Chronic insomnia 04/26/2011  . PPD positive 04/17/2011  . URINARY INCONTINENCE, STRESS, FEMALE 04/29/2010  . MEMORY LOSS 12/23/2009  . Chronic bronchitis NEC 12/23/2009  . DIABETIC PERIPHERAL NEUROPATHY 06/28/2009  . Acute low back pain due to trauma 12/20/2008  . FIBROIDS, UTERUS 12/18/2008  . ABDOMINAL BLOATING 12/17/2008  . Constipation 01/24/2008  . GERD 01/23/2008  . SPRAIN&STRAIN OTHER SPECIFIED SITES KNEE&LEG 07/20/2007  . UTI 05/16/2007  . LACERATION OF FINGER 10/12/2006  . THYROID NODULE, RIGHT 08/05/2006  . DM (diabetes mellitus) type II uncontrolled, periph vascular disorder ([place]) 08/05/2006  . HYPERLIPIDEMIA 08/05/2006  . Essential hypertension 08/05/2006  . OSTEOPOROSIS NOS 08/05/2006     Sumner Boast., PT 04/24/2015, 3:39 PM  [address] [place], [place], [address] Phone: [phone]   Fax:  [phone]  Name: Mary Flynn MRN: [id number] Date of Birth: July 29, 1934

## 2015-04-25 ENCOUNTER — Telehealth: Payer: Self-pay

## 2015-04-25 ENCOUNTER — Encounter: Payer: Self-pay | Admitting: Dietician

## 2015-04-25 ENCOUNTER — Encounter: Payer: Medicare Other | Attending: Endocrinology | Admitting: Dietician

## 2015-04-25 VITALS — Ht 60.0 in | Wt 135.0 lb

## 2015-04-25 DIAGNOSIS — E1165 Type 2 diabetes mellitus with hyperglycemia: Secondary | ICD-10-CM | POA: Diagnosis not present

## 2015-04-25 DIAGNOSIS — Z794 Long term (current) use of insulin: Secondary | ICD-10-CM | POA: Diagnosis not present

## 2015-04-25 DIAGNOSIS — E118 Type 2 diabetes mellitus with unspecified complications: Secondary | ICD-10-CM

## 2015-04-25 NOTE — Telephone Encounter (Signed)
Patient stated that she has too much pain in her back and her apt with Neuro is on 05/16/15. She said the pain has been keeping her up the last few nights and she would like something different for the pain because the Tramadol is not strong enough.    KP

## 2015-04-25 NOTE — Progress Notes (Signed)
Medical Nutrition Therapy:  Appt start time: 0830 end time:  1000. Appointment delay due to inability to access citrix.  Assessment:  Primary concerns today: Patient is here alone.  She would like to learn to eat to reduce her blood sugar.  She has not started the [facility] and is concerned about incontinence at night which she has experienced before.  Hx includes Type 2 Diabetes for the past 20 years not on insulin, HTN, and hyperlipidemia.  She has followed an [place] her whole life and does not eat eggs.  She used to walk daily but now is too tired and states that she cannot do other exercise because of increased back problems.  Weight today 135 lbs.  She has not been taking her blood sugar recently.  HgbA1C 8.3% 03/22/15 which is decreased from 9.1% 09/25/14. She reports frequent "blacking out" which she describes as low blood sugar.  She drinks OJ and fees "bad" for about an hour.  She reports no depression but has had increased sadness with death of son and other deaths.  She complaints of recent sinus problems and increased pain.  She has had steroid injections in her back in the past but reports that she will not do this again due to the increase of her blood sugar.  Patient lives with her husband.  She has a daughter who lives in [place] and is considering moving there since son died.  She has a college degree which included Nutrition and has lived in the [place] 35 years.  She is from [place].  Preferred Learning Style:   Auditory  Visual  Hands on  No preference indicated   Learning Readiness:   Ready  Change in progress   MEDICATIONS: Includes: Glimepiride, Levemir 18 units q HS, Humalog 5 units with breakfast, 7 units with lunch and dinner.   DIETARY INTAKE:  Avoided foods include not much oil.  Uses small amount of ghee. Avoids peas, potatoes.  24-hr recall:  B (10 AM): 1 slice [employer] bread, peanut butter, 1 slice cheese OR cream of wheat with yogurt and  vegetables Snk (AM): [place] and coffee L ( 2PM): lentils, vegetables, spices, vegeburger, yogurt, rice OR taco bell bean and rice burrito Snk ( PM): indian tea, cheese crackers D ( PM): 1/2 pack ramen noodles OR [employer] tortilla with vegetables, milk sometimes salad Snk ( PM): none Beverages: decaf coffee with splenda and creamer, indian tea, gatorade, OJ for low blood sugar only, diet coke  Usual physical activity: none, used to bike and walk but states that she is too tired.  Estimated energy needs: 1400 calories 158 g carbohydrates 88 g protein 47 g fat  Progress Towards Goal(s):  In progress.   Nutritional Diagnosis:  NB-1.1 Food and nutrition-related knowledge deficit As related to balance of carbohydrate, protein, and fat.  As evidenced by patient report.    Intervention:  Nutrition education/counseling regarding a vegetarian diabetic [place] diet.  Discussed portion size.  Discussed the effect of pain, stress, and illness on blood sugar.  Discussed insulin resistance.  Aim for 45 grams of carbohydrates at each meal. Aim for 1 serving of protein at each meal (beans, lentils, milk, yogurt, tofu).  Avoid gatorade, avoid raman noodles.  Breakfast, lunch, dinner every day.  Do not skip meals.  If you are not feeling well at least eat yogurt and fruit.  If your blood sugar is low drink 1/2 cup juice or regular soda, check your blood sugar in 15 minutes,  if it is above 80 then eat a meal or snack with protein and carbohydrate.  Check your blood sugar twice a day.  Teaching Method Utilized:  Visual Auditory Hands on  Handouts given during visit include:  Meal plan card  Carb counting [place] and [place] foods  AAPI guide to nutrition Health, and Diabetes-chapter on [place] foods  Barriers to learning/adherence to lifestyle change: none  Demonstrated degree of understanding via:  Teach Back   Monitoring/Evaluation:  Dietary intake, exercise, and body weight prn.

## 2015-04-26 ENCOUNTER — Telehealth: Payer: Self-pay | Admitting: Family Medicine

## 2015-04-26 MED ORDER — HYDROCODONE-ACETAMINOPHEN 5-325 MG PO TABS: 1.0000 | ORAL_TABLET | Freq: Three times a day (TID) | ORAL | Status: DC | PRN

## 2015-04-26 MED FILL — HYDROCODON-APAP 5-325: 5-325 | 10 days supply | Qty: 30 | Fill #0

## 2015-04-26 NOTE — Telephone Encounter (Signed)
Patient has been made aware and verbalized understanding, she will continue to take OTC cough suppressants.    KP

## 2015-04-26 NOTE — Telephone Encounter (Signed)
vicodin 5 /325  #30  1 po q8h prn

## 2015-04-26 NOTE — Telephone Encounter (Signed)
Patient has been made aware she can come by and pick up Hydrocodone.     KP

## 2015-04-26 NOTE — Telephone Encounter (Signed)
°  Relation to [id number] Call back number:[phone] Pharmacy:[facility]  Reason for call: pt states that her insurance will not cover the rx benzonatate, pt need something else sent in with 90 day supply. Pt left the rx book for Dr. Etter Sjogren to look at

## 2015-04-26 NOTE — Telephone Encounter (Signed)
Formulary on the ledge, Please advise    KP

## 2015-04-26 NOTE — Telephone Encounter (Signed)
There are no cough meds on formulary

## 2015-04-29 ENCOUNTER — Ambulatory Visit: Payer: Self-pay | Admitting: Medical

## 2015-04-30 ENCOUNTER — Encounter: Payer: Self-pay | Admitting: Medical

## 2015-04-30 ENCOUNTER — Ambulatory Visit (INDEPENDENT_AMBULATORY_CARE_PROVIDER_SITE_OTHER): Payer: Medicare Other | Admitting: Medical

## 2015-04-30 ENCOUNTER — Ambulatory Visit (HOSPITAL_BASED_OUTPATIENT_CLINIC_OR_DEPARTMENT_OTHER)
Admission: RE | Admit: 2015-04-30 | Discharge: 2015-04-30 | Disposition: A | Discharge status: Home / Self Care | Payer: Medicare Other | Source: Ambulatory Visit | Attending: Medical | Admitting: Medical

## 2015-04-30 VITALS — BP 120/80 | HR 73 | Temp 98.7°F | Ht 59.0 in | Wt 127.8 lb

## 2015-04-30 DIAGNOSIS — R05 Cough: Secondary | ICD-10-CM | POA: Insufficient documentation

## 2015-04-30 DIAGNOSIS — J209 Acute bronchitis, unspecified: Secondary | ICD-10-CM

## 2015-04-30 DIAGNOSIS — R509 Fever, unspecified: Secondary | ICD-10-CM | POA: Diagnosis not present

## 2015-04-30 DIAGNOSIS — H8110 Benign paroxysmal vertigo, unspecified ear: Secondary | ICD-10-CM

## 2015-04-30 DIAGNOSIS — J309 Allergic rhinitis, unspecified: Secondary | ICD-10-CM

## 2015-04-30 DIAGNOSIS — J984 Other disorders of lung: Secondary | ICD-10-CM | POA: Insufficient documentation

## 2015-04-30 LAB — CBC WITH DIFFERENTIAL/PLATELET
BASOS ABS: 0 10*3/uL (ref 0.0–0.1)
Basophils Relative: 0.5 % (ref 0.0–3.0)
EOS PCT: 2.6 % (ref 0.0–5.0)
Eosinophils Absolute: 0.2 10*3/uL (ref 0.0–0.7)
HCT: 38.9 % (ref 36.0–46.0)
Hemoglobin: 12.8 g/dL (ref 12.0–15.0)
Lymphocytes Relative: 18.6 % (ref 12.0–46.0)
Lymphs Abs: 1.4 10*3/uL (ref 0.7–4.0)
MCHC: 32.9 g/dL (ref 30.0–36.0)
MCV: 82.2 fl (ref 78.0–100.0)
MONO ABS: 0.8 10*3/uL (ref 0.1–1.0)
Monocytes Relative: 11 % (ref 3.0–12.0)
NEUTROS PCT: 67.3 % (ref 43.0–77.0)
Neutro Abs: 5 10*3/uL (ref 1.4–7.7)
Platelets: 244 10*3/uL (ref 150.0–400.0)
RBC: 4.73 Mil/uL (ref 3.87–5.11)
RDW: 15.4 % (ref 11.5–15.5)
WBC: 7.5 10*3/uL (ref 4.0–10.5)

## 2015-04-30 MED ORDER — DOXYCYCLINE HYCLATE 100 MG PO TABS: 100.0000 mg | ORAL_TABLET | Freq: Two times a day (BID) | ORAL | Status: DC

## 2015-04-30 MED ORDER — HYDROCODONE-HOMATROPINE 5-1.5 MG/5ML PO SYRP: 5.0000 mL | ORAL_SOLUTION | Freq: Three times a day (TID) | ORAL | Status: DC | PRN

## 2015-04-30 MED ORDER — AZELASTINE HCL 0.1 % NA SOLN: 2.0000 | Freq: Two times a day (BID) | NASAL | Status: DC

## 2015-04-30 MED ORDER — FLUTICASONE PROPIONATE 50 MCG/ACT NA SUSP: 2.0000 | Freq: Every day | NASAL | Status: DC

## 2015-04-30 MED FILL — AZELASTINE 0.1% (137 MCG) S: 0.1 | 30 days supply | Qty: 30 | Fill #0

## 2015-04-30 MED FILL — DOXYCYCLINE 100 MG TABLET: 100 | 7 days supply | Qty: 14 | Fill #0

## 2015-04-30 MED FILL — HYDROCODONE-HOMATROPINE SYR: 5-1.5 | 8 days supply | Qty: 120 | Fill #0

## 2015-04-30 NOTE — Progress Notes (Signed)
Subjective:    Patient ID: Mary Flynn, female    DOB: [birth year], 81 y.o.   MRN: [id number]  HPI   Pt in with 8 wks of cough and nasal congestion(but worse over past 3 weeks). Pt states she is sneezing a lot. Runny nose. Now some thick mucus.(for one week). Pt has concern for bronchitis. She feels some back pain.(left lower rib region when she coughs)  Pt hs been using otc vics inhaler.  Pt is using flonase but intermittent.   Pt feels fever at times.   Some left lower rib area pain when she cough     Review of Systems  Constitutional: Positive for fatigue. Negative for fever and chills.  HENT: Positive for congestion and rhinorrhea. Negative for dental problem, postnasal drip and sneezing.   Respiratory: Positive for cough. Negative for chest tightness, shortness of breath and wheezing.   Cardiovascular: Negative for chest pain and palpitations.  Gastrointestinal: Negative for abdominal pain.  Skin: Negative for rash.  Neurological: Negative for dizziness and headaches.       Transient  Dizziness at times lying supine. Mentioned at end of the exam. Not now.  Hematological: Negative for adenopathy. Does not bruise/bleed easily.  Psychiatric/Behavioral: Negative for behavioral problems, confusion and dysphoric mood.   Past Medical History  Diagnosis Date  . Diabetes mellitus   . Hyperlipidemia   . Hypertension   . Left rib fracture 02/18/2014    Social History   Social History  . Marital Status: Married    Spouse Name: Telina Kleckley  . Number of Children: 2  . Years of Education: College   Occupational History  . housewife    Social History Main Topics  . Smoking status: Never Smoker   . Smokeless tobacco: Never Used  . Alcohol Use: No  . Drug Use: No  . Sexual Activity: Not Currently   Other Topics Concern  . Not on file   Social History Narrative   Patient lives at home with spouse.   Caffeine Use: rarely    Past Surgical History  Procedure  Laterality Date  . Appendectomy    . Surgical sterilization  40 years ago  . Refractive surgery      Left Eye    Family History  Problem Relation Age of Onset  . Depression    . Cancer      laryngeal  . Diabetes    . Diabetes Mother     Allergies  Allergen Reactions  . Ace Inhibitors Cough  . Losartan Cough  . Penicillins Hives  . Sulfa Antibiotics Hives  . Sulfonamide Derivatives     Current Outpatient Prescriptions on File Prior to Visit  Medication Sig Dispense Refill  . amLODipine (NORVASC) 5 MG tablet Take 1 tablet (5 mg total) by mouth daily. 90 tablet 1  . aspirin EC 81 MG tablet Take 81 mg by mouth daily.    Marland Kitchen atorvastatin (LIPITOR) 10 MG tablet Take 1 tablet by mouth  every day 90 tablet 1  . b complex vitamins tablet Take 1 tablet by mouth daily.    . baclofen (LIORESAL) 20 MG tablet Take 1 tablet (20 mg total) by mouth 3 (three) times daily. 270 tablet 0  . Blood Glucose Monitoring Suppl (ONE TOUCH ULTRA 2) W/DEVICE KIT Use to test blood sugar 2 times daily as instructed. Dx code: E11.49 1 each 0  . canagliflozin (INVOKANA) 100 MG TABS tablet Take 1 tablet (100 mg total) by mouth daily before  breakfast. 30 tablet 0  . fluticasone (FLONASE) 50 MCG/ACT nasal spray Place 2 sprays into both nostrils daily. 16 g 1  . gabapentin (NEURONTIN) 300 MG capsule Take 1 capsule by mouth 3  times daily 270 capsule 1  . glimepiride (AMARYL) 4 MG tablet Take 1 tablet by mouth two  times daily 180 tablet 1  . glucose blood (ONE TOUCH ULTRA TEST) test strip Use to test blood sugar 2 times daily as instructed. Dx code: E11.49 200 each 3  . HYDROcodone-acetaminophen (NORCO/VICODIN) 5-325 MG tablet Take 1 tablet by mouth every 8 (eight) hours as needed for moderate pain. 30 tablet 0  . Insulin Detemir (LEVEMIR FLEXPEN) 100 UNIT/ML Pen Inject 14 Units into the skin at bedtime. (Patient taking differently: Inject 16 Units into the skin at bedtime. ) 15 mL 3  . insulin lispro (HUMALOG  KWIKPEN) 100 UNIT/ML KiwkPen Inject 4 units at breakfast, 6 units at lunch and 4 units at dinner. 15 mL 1  . Insulin Pen Needle 32G X 4 MM MISC Use 3 per day 100 each 3  . meloxicam (MOBIC) 15 MG tablet Take 1 tablet (15 mg total) by mouth daily. 30 tablet 8  . metFORMIN (GLUCOPHAGE) 1000 MG tablet Take 1 tablet by mouth  twice a day with meals 180 tablet 1  . metoprolol succinate (TOPROL-XL) 100 MG 24 hr tablet Take 1 tablet (100 mg total) by mouth daily. Take with or immediately following a meal. 90 tablet 3  . Niacin CR 1000 MG TBCR 1 po bid (Patient taking differently: 1 po daily) 60 each 5  . ONE TOUCH ULTRA TEST test strip Test two times daily 200 each 1  . oxybutynin (DITROPAN) 5 MG tablet Take 1 tablet (5 mg total) by mouth 2 (two) times daily. 180 tablet 3  . pantoprazole (PROTONIX) 40 MG tablet Take 1 tablet (40 mg total) by mouth daily. 90 tablet 1  . polyethylene glycol powder (GLYCOLAX/MIRALAX) powder Using measuring cap mix  17gm in water and drink  daily 1530 g 1  . traMADol (ULTRAM) 50 MG tablet Take 1 tablet (50 mg total) by mouth every 6 (six) hours as needed. 180 tablet 1  . valsartan (DIOVAN) 320 MG tablet Take 1 tablet by mouth  daily 90 tablet 1   No current facility-administered medications on file prior to visit.    BP 120/80 mmHg  Pulse 73  Temp(Src) 98.7 F (37.1 C) (Oral)  Ht _0  (1.499 m)  Wt 127 lb 12.8 oz (57.97 kg)  BMI 25.80 kg/m2  SpO2 98%       Objective:   Physical Exam  General  Mental Status - Alert. General Appearance - Well groomed. Not in acute distress.  Skin Rashes- No Rashes.  HEENT Head- Normal. Ear Auditory Canal - Left- Normal. Right - Normal.Tympanic Membrane- Left- Normal. Right- Normal. Eye Sclera/Conjunctiva- Left- Normal. Right- Normal. Nose & Sinuses Nasal Mucosa- Left-  Boggy and Congested. Right-  Boggy and  Congested.Bilateral maxillary and frontal sinus pressure. Mouth & Throat Lips: Upper Lip- Normal: no dryness,  cracking, pallor, cyanosis, or vesicular eruption. Lower Lip-Normal: no dryness, cracking, pallor, cyanosis or vesicular eruption. Buccal Mucosa- Bilateral- No Aphthous ulcers. Oropharynx- No Discharge or Erythema. Tonsils: Characteristics- Bilateral- No Erythema or Congestion. Size/Enlargement- Bilateral- No enlargement. Discharge- bilateral-None.  Neck Neck- Supple. No Masses.   Chest and Lung Exam Auscultation: Breath Sounds:-even and unlabored.  Cardiovascular Auscultation:Rythm- Regular, rate and rhythm. Murmurs & Other Heart Sounds:Ausculatation of the heart  reveal- No Murmurs.  Lymphatic Head & Neck General Head & Neck Lymphatics: Bilateral: Description- No Localized lymphadenopathy  Neurologic- CN III- XII intact. .       Assessment & Plan:  For probable underlying allergic rhinitis use flonase. Can also add on astelin nasal spray.  For cough rx hycodan. But stop all pain medication(with hydrocodone or tramadol).  For subjective fever, rib pain and coughing for months will get cbc and cxr. Then decide which antibiotic to give after xray report back. After xray review decided to send in doxycycline.  Follow up in 7-10 days or as needed

## 2015-04-30 NOTE — Patient Instructions (Signed)
For probable underlying allergic rhinitis use flonase. Can also add on astelin nasal spray.  For cough rx hycodan. But stop all pain medication(with hydrocodone or tramadol).  For subjective fever, rib pain and coughing for months will get cbc and cxr. Then decide which antibiotic to give after xray report back.  Follow up in 7-10 days or as needed

## 2015-04-30 NOTE — Progress Notes (Signed)
Pre visit review using our clinic review tool, if applicable. No additional management support is needed unless otherwise documented below in the visit note. 

## 2015-05-01 ENCOUNTER — Ambulatory Visit: Payer: Medicare Other | Admitting: Physical Therapy

## 2015-05-01 ENCOUNTER — Encounter: Payer: Self-pay | Admitting: Physical Therapy

## 2015-05-01 DIAGNOSIS — M542 Cervicalgia: Secondary | ICD-10-CM | POA: Diagnosis not present

## 2015-05-01 DIAGNOSIS — M545 Low back pain, unspecified: Secondary | ICD-10-CM

## 2015-05-01 DIAGNOSIS — M797 Fibromyalgia: Secondary | ICD-10-CM

## 2015-05-01 NOTE — Therapy (Signed)
[address] [place], [place], [address] Phone: [phone]   Fax:  [phone]  Physical Therapy Treatment  Patient Details  Name: Mary Flynn MRN: [id number] Date of Birth: 1934/10/18 Referring Provider: Etter Sjogren  Encounter Date: 05/01/2015      PT End of Session - 05/01/15 1624    Visit Number 5   Date for PT Re-Evaluation 06/01/15   PT Start Time 1520   PT Stop Time 1630   PT Time Calculation (min) 70 min   Activity Tolerance Patient tolerated treatment well   Behavior During Therapy [facility] for tasks assessed/performed      Past Medical History  Diagnosis Date  . Diabetes mellitus   . Hyperlipidemia   . Hypertension   . Left rib fracture 02/18/2014    Past Surgical History  Procedure Laterality Date  . Appendectomy    . Surgical sterilization  40 years ago  . Refractive surgery      Left Eye    There were no vitals filed for this visit.  Visit Diagnosis:  Neck pain  Bilateral low back pain without sciatica  Fibromyalgia      Subjective Assessment - 05/01/15 1621    Subjective Patient reports tha she saw MD yesterday, some worries about dizziness and vertigo over the last month, she is very congested today with a lot of coughing and phlegm.   Currently in Pain? Yes   Pain Score 3    Pain Location Back   Pain Orientation Lower                         [facility] Adult PT Treatment/Exercise - 05/01/15 0001    Manual Therapy   Manual Therapy Soft tissue mobilization;Passive ROM   Soft tissue mobilization STM of the calves, thighs, back and neck   Passive ROM PROM of the LE's and trunk                PT Education - 05/01/15 1623    Education provided Yes   Education Details gave gaze exercises to patient went over and performed   Person(s) Educated Patient   Methods Explanation;Demonstration   Comprehension Verbalized understanding          PT Short Term  Goals - 04/24/15 1538    PT SHORT TERM GOAL #1   Title independent with a stretching routine   Status Achieved           PT Long Term Goals - 05/01/15 1627    PT LONG TERM GOAL #1   Title decrease pain 50%   Status On-going   PT LONG TERM GOAL #2   Title increase lumbar ROM 25%   Status On-going               Plan - 05/01/15 1624    Clinical Impression Statement Patient with what appears to be increased head and chest congestion, she does report that she has had some dizziness/vertigo over the pst month, saw MD yesterday.  The dizziness is exacerbated by head turns and getting up from lying down, the BP is WNL's and no significan drops.   PT Next Visit Plan may try traction in the future, today patient was not feeling well and wanted to know what to do for the dizziness   Consulted and Agree with Plan of Care Patient        Problem List Patient Active Problem List  Diagnosis Date Noted  . Pre-ulcerative corn or callous 03/13/2015  . HTN (hypertension) 01/11/2015  . DM type 2 with diabetic peripheral neuropathy ([place]) 10/16/2014  . Left rib fracture 02/18/2014  . Skin infection 09/28/2013  . Otitis, externa, infective 09/28/2013  . Cough 01/11/2013  . Tinea cruris 07/27/2012  . Urinary frequency 07/27/2012  . Eczema 03/08/2012  . Headache(784.0) 03/08/2012  . Arm pain, right 08/24/2011  . Depression 07/16/2011  . Compression fracture of L2 lumbar vertebra ([place]) 06/08/2011  . Chronic insomnia 04/26/2011  . PPD positive 04/17/2011  . URINARY INCONTINENCE, STRESS, FEMALE 04/29/2010  . MEMORY LOSS 12/23/2009  . Chronic bronchitis NEC 12/23/2009  . DIABETIC PERIPHERAL NEUROPATHY 06/28/2009  . Acute low back pain due to trauma 12/20/2008  . FIBROIDS, UTERUS 12/18/2008  . ABDOMINAL BLOATING 12/17/2008  . Constipation 01/24/2008  . GERD 01/23/2008  . SPRAIN&STRAIN OTHER SPECIFIED SITES KNEE&LEG 07/20/2007  . UTI 05/16/2007  . LACERATION OF FINGER 10/12/2006   . THYROID NODULE, RIGHT 08/05/2006  . DM (diabetes mellitus) type II uncontrolled, periph vascular disorder ([place]) 08/05/2006  . HYPERLIPIDEMIA 08/05/2006  . Essential hypertension 08/05/2006  . OSTEOPOROSIS NOS 08/05/2006    Mary Flynn., PT 05/01/2015, 4:28 PM  [address], [place], [address] Phone: [phone]   Fax:  [phone]  Name: Mary Flynn MRN: [id number] Date of Birth: 04/04/1934

## 2015-05-06 ENCOUNTER — Telehealth: Payer: Self-pay | Admitting: Endocrinology

## 2015-05-06 NOTE — Telephone Encounter (Signed)
noted 

## 2015-05-06 NOTE — Telephone Encounter (Signed)
FYI  Please see below

## 2015-05-06 NOTE — Telephone Encounter (Signed)
Team health note dated 05/05/15 10:25 pm Caller says she is diabetic and takes humalog and levemir. Took AM meds. Usually takes 16 u of levemir at night but tonight got distracted and took humalog instead.  Dr. Lorelei Pont called back and gave further instructions for this caller. Dr. Lorelei Pont states for caller to check bs every 15 min for the 1st hour then once an hour times 3. If BS below 100 to eat a snack or drink some OJ and if it continue to stay low or is she has any symptoms go to the ed. Caller called back and was on phone with Shirlean Mylar when MD called back, this nurse conference with call and Daniel Nones, RN to give instructions. Caller states most recent BS was 241. Caller verbalized understanding.

## 2015-05-07 ENCOUNTER — Encounter: Payer: Self-pay | Admitting: Medical

## 2015-05-07 ENCOUNTER — Ambulatory Visit (INDEPENDENT_AMBULATORY_CARE_PROVIDER_SITE_OTHER): Payer: Medicare Other | Admitting: Medical

## 2015-05-07 VITALS — BP 124/78 | HR 88 | Temp 98.1°F | Ht 59.0 in | Wt 129.0 lb

## 2015-05-07 DIAGNOSIS — R05 Cough: Secondary | ICD-10-CM

## 2015-05-07 DIAGNOSIS — R059 Cough, unspecified: Secondary | ICD-10-CM

## 2015-05-07 DIAGNOSIS — J309 Allergic rhinitis, unspecified: Secondary | ICD-10-CM | POA: Diagnosis not present

## 2015-05-07 MED ORDER — LEVOCETIRIZINE DIHYDROCHLORIDE 5 MG PO TABS: 5.0000 mg | ORAL_TABLET | Freq: Every evening | ORAL | Status: DC

## 2015-05-07 MED FILL — LEVOCETIRIZINE 5 MG TABLET: 5 | 30 days supply | Qty: 30 | Fill #0

## 2015-05-07 NOTE — Patient Instructions (Addendum)
Pt has persiting minimal faint cough. I have offered referal to pulmonlogist but declined today.   I will rx xyzal antihistamine. Continue your 2 nasal sprays. (I think possible allergic cause)  Cough may be related to pnd allergies.   Recent cxr negative so doubt infection.  You don't describe reflux today but are on protonix.(and you state prior egd was negative)  Asthma and copd can cause cough  And  Dr. Melvyn Novas did give you advair on last evaluation. But you state never took and your cough did get better.  Follow up in 3 weeks or as needed

## 2015-05-07 NOTE — Progress Notes (Signed)
Subjective:    Patient ID: Mary Flynn, female    DOB: 1934-07-03, 81 y.o.   MRN: [id number]  HPI  Pt in for persisting mucous production when she cough. She states this is persistent for some time. Pt states this is going on for 9 weeks now.  At times feels mild faint wheeze type sound. But on discussion further doses not sound like asthma or copd wheeze. She refused to be on advair in past when Dr, Melvyn Novas saw patient and recommened.  Pt feels like she has hoarse voice.   Pt describes nose running every day and watery nose. Some sneezing.  Pt had some scarring on last cxr but no pneumonia.  Pt symptoms persist despite flonase, astelin, hycodan and doxycycline.  Pt notes some heartburn last 2-3 days. Pt is on protonix.  But typically she denies any reflux symptoms.    Pt did see Dr. Melvyn Novas in the past. Dr. Melvyn Novas gave her Advair in the past. Then he considered Spiriva. On review it appears she has had cough long duration in the past as well.     Review of Systems  Constitutional: Negative for fever, chills and fatigue.  HENT: Negative for congestion, ear pain, postnasal drip, sinus pressure, sneezing, sore throat and trouble swallowing.   Respiratory: Positive for cough. Negative for chest tightness, shortness of breath and wheezing.   Cardiovascular: Negative for chest pain and palpitations.  Musculoskeletal: Negative for back pain.  Skin: Negative for pallor and rash.  Neurological: Negative for dizziness, light-headedness and headaches.  Hematological: Negative for adenopathy. Does not bruise/bleed easily.  Psychiatric/Behavioral: Negative for behavioral problems, confusion and dysphoric mood.   Past Medical History  Diagnosis Date  . Diabetes mellitus   . Hyperlipidemia   . Hypertension   . Left rib fracture 02/18/2014    Social History   Social History  . Marital Status: Married    Spouse Name: Varvara Legault  . Number of Children: 2  . Years of Education: College     Occupational History  . housewife    Social History Main Topics  . Smoking status: Never Smoker   . Smokeless tobacco: Never Used  . Alcohol Use: No  . Drug Use: No  . Sexual Activity: Not Currently   Other Topics Concern  . Not on file   Social History Narrative   Patient lives at home with spouse.   Caffeine Use: rarely    Past Surgical History  Procedure Laterality Date  . Appendectomy    . Surgical sterilization  40 years ago  . Refractive surgery      Left Eye    Family History  Problem Relation Age of Onset  . Depression    . Cancer      laryngeal  . Diabetes    . Diabetes Mother     Allergies  Allergen Reactions  . Ace Inhibitors Cough  . Losartan Cough  . Penicillins Hives  . Sulfa Antibiotics Hives  . Sulfonamide Derivatives     Current Outpatient Prescriptions on File Prior to Visit  Medication Sig Dispense Refill  . amLODipine (NORVASC) 5 MG tablet Take 1 tablet (5 mg total) by mouth daily. 90 tablet 1  . aspirin EC 81 MG tablet Take 81 mg by mouth daily.    Marland Kitchen atorvastatin (LIPITOR) 10 MG tablet Take 1 tablet by mouth  every day 90 tablet 1  . azelastine (ASTELIN) 0.1 % nasal spray Place 2 sprays into both nostrils 2 (two)  times daily. Use in each nostril as directed 30 mL 3  . b complex vitamins tablet Take 1 tablet by mouth daily.    . baclofen (LIORESAL) 20 MG tablet Take 1 tablet (20 mg total) by mouth 3 (three) times daily. 270 tablet 0  . Blood Glucose Monitoring Suppl (ONE TOUCH ULTRA 2) W/DEVICE KIT Use to test blood sugar 2 times daily as instructed. Dx code: E11.49 1 each 0  . canagliflozin (INVOKANA) 100 MG TABS tablet Take 1 tablet (100 mg total) by mouth daily before breakfast. 30 tablet 0  . doxycycline (VIBRA-TABS) 100 MG tablet Take 1 tablet (100 mg total) by mouth 2 (two) times daily. 14 tablet 0  . fluticasone (FLONASE) 50 MCG/ACT nasal spray Place 2 sprays into both nostrils daily. 16 g 1  . gabapentin (NEURONTIN) 300 MG  capsule Take 1 capsule by mouth 3  times daily 270 capsule 1  . glimepiride (AMARYL) 4 MG tablet Take 1 tablet by mouth two  times daily 180 tablet 1  . glucose blood (ONE TOUCH ULTRA TEST) test strip Use to test blood sugar 2 times daily as instructed. Dx code: E11.49 200 each 3  . HYDROcodone-acetaminophen (NORCO/VICODIN) 5-325 MG tablet Take 1 tablet by mouth every 8 (eight) hours as needed for moderate pain. 30 tablet 0  . HYDROcodone-homatropine (HYCODAN) 5-1.5 MG/5ML syrup Take 5 mLs by mouth every 8 (eight) hours as needed for cough. 120 mL 0  . Insulin Detemir (LEVEMIR FLEXPEN) 100 UNIT/ML Pen Inject 14 Units into the skin at bedtime. (Patient taking differently: Inject 16 Units into the skin at bedtime. ) 15 mL 3  . insulin lispro (HUMALOG KWIKPEN) 100 UNIT/ML KiwkPen Inject 4 units at breakfast, 6 units at lunch and 4 units at dinner. 15 mL 1  . Insulin Pen Needle 32G X 4 MM MISC Use 3 per day 100 each 3  . meloxicam (MOBIC) 15 MG tablet Take 1 tablet (15 mg total) by mouth daily. 30 tablet 8  . metFORMIN (GLUCOPHAGE) 1000 MG tablet Take 1 tablet by mouth  twice a day with meals 180 tablet 1  . metoprolol succinate (TOPROL-XL) 100 MG 24 hr tablet Take 1 tablet (100 mg total) by mouth daily. Take with or immediately following a meal. 90 tablet 3  . Niacin CR 1000 MG TBCR 1 po bid (Patient taking differently: 1 po daily) 60 each 5  . ONE TOUCH ULTRA TEST test strip Test two times daily 200 each 1  . oxybutynin (DITROPAN) 5 MG tablet Take 1 tablet (5 mg total) by mouth 2 (two) times daily. 180 tablet 3  . pantoprazole (PROTONIX) 40 MG tablet Take 1 tablet (40 mg total) by mouth daily. 90 tablet 1  . polyethylene glycol powder (GLYCOLAX/MIRALAX) powder Using measuring cap mix  17gm in water and drink  daily 1530 g 1  . traMADol (ULTRAM) 50 MG tablet Take 1 tablet (50 mg total) by mouth every 6 (six) hours as needed. 180 tablet 1  . valsartan (DIOVAN) 320 MG tablet Take 1 tablet by mouth  daily  90 tablet 1   No current facility-administered medications on file prior to visit.    BP 124/78 mmHg  Pulse 88  Temp(Src) 98.1 F (36.7 C) (Oral)  Ht 4' 11" (1.499 m)  Wt 129 lb (58.514 kg)  BMI 26.04 kg/m2  SpO2 98%       Objective:   Physical Exam  General  Mental Status - Alert. General Appearance - Well  groomed. Not in acute distress.  Skin Rashes- No Rashes.  HEENT Head- Normal. Ear Auditory Canal - Left- Normal. Right - Normal.Tympanic Membrane- Left- Normal. Right- Normal. Eye Sclera/Conjunctiva- Left- Normal. Right- Normal. Nose & Sinuses Nasal Mucosa- Left-  Boggy and Congested. Right-  Boggy and  Congested.Bilateral maxillary and frontal sinus pressure. Mouth & Throat Lips: Upper Lip- Normal: no dryness, cracking, pallor, cyanosis, or vesicular eruption. Lower Lip-Normal: no dryness, cracking, pallor, cyanosis or vesicular eruption. Buccal Mucosa- Bilateral- No Aphthous ulcers. Oropharynx- No Discharge or Erythema. +pnd. Tonsils: Characteristics- Bilateral- No Erythema or Congestion. Size/Enlargement- Bilateral- No enlargement. Discharge- bilateral-None.  Neck Neck- Supple. No Masses.   Chest and Lung Exam Auscultation: Breath Sounds:-Clear even and unlabored.  Cardiovascular Auscultation:Rythm- Regular, rate and rhythm. Murmurs & Other Heart Sounds:Ausculatation of the heart reveal- No Murmurs.  Lymphatic Head & Neck General Head & Neck Lymphatics: Bilateral: Description- No Localized lymphadenopathy.       Assessment & Plan:  Pt has persisting minimal faint cough. I have offered referal to pulmonlogist but declined today. Also considered ENT referral but she declines any referral today. When I offered referral she minimized degree of cough.  I will rx xyzal antihistamine. Continue your 2 nasal sprays. (I think possible allergic cause)  Cough may be related to pnd allergies.   Recent cxr negative so doubt infection.  You don't describe  reflux today but are on protonix.(and you state prior egd was negative)  Asthma and copd can cause cough  And  Dr. Melvyn Novas did give you advair on last evaluation. But you state never took and your cough did get better.  Follow up in 3 weeks or as needed.  If her cough persists I did advise pt would want to refer her back to Dr. Melvyn Novas. May even consider Ent as well.

## 2015-05-07 NOTE — Progress Notes (Signed)
Pre visit review using our clinic review tool, if applicable. No additional management support is needed unless otherwise documented below in the visit note. 

## 2015-05-08 ENCOUNTER — Ambulatory Visit: Payer: Medicare Other | Admitting: Physical Therapy

## 2015-05-08 ENCOUNTER — Ambulatory Visit: Payer: Self-pay | Admitting: Endocrinology

## 2015-05-08 ENCOUNTER — Encounter: Payer: Self-pay | Admitting: Physical Therapy

## 2015-05-08 DIAGNOSIS — M545 Low back pain, unspecified: Secondary | ICD-10-CM

## 2015-05-08 DIAGNOSIS — M797 Fibromyalgia: Secondary | ICD-10-CM | POA: Diagnosis not present

## 2015-05-08 DIAGNOSIS — M542 Cervicalgia: Secondary | ICD-10-CM

## 2015-05-08 NOTE — Therapy (Signed)
[place] [address] [place], [place], [address] Phone: [phone]   Fax:  [phone]  Physical Therapy Treatment  Patient Details  Name: Mary Flynn MRN: [id number] Date of Birth: [birth year] Referring Provider: Etter Sjogren  Encounter Date: 05/08/2015      PT End of Session - 05/08/15 1630    Visit Number 6   Date for PT Re-Evaluation 06/01/15   PT Start Time 1525   PT Stop Time [id number]   PT Time Calculation (min) 50 min   Behavior During Therapy [facility] for tasks assessed/performed      Past Medical History  Diagnosis Date  . Diabetes mellitus   . Hyperlipidemia   . Hypertension   . Left rib fracture 02/18/2014    Past Surgical History  Procedure Laterality Date  . Appendectomy    . Surgical sterilization  40 years ago  . Refractive surgery      Left Eye    There were no vitals filed for this visit.  Visit Diagnosis:  Neck pain  Bilateral low back pain without sciatica  Fibromyalgia      Subjective Assessment - 05/08/15 1628    Subjective Patient reports that she feels much better for a few days after therapy, reports that she has not returned to her walking yet.   Currently in Pain? Yes   Pain Score 3    Pain Location Back   Aggravating Factors  walking and standing   Pain Relieving Factors rest, therapy                         [facility] Adult PT Treatment/Exercise - 05/08/15 0001    Manual Therapy   Manual Therapy Soft tissue mobilization;Passive ROM   Soft tissue mobilization STM of the calves, thighs, back and neck   Passive ROM PROM of the LE's and trunk                  PT Short Term Goals - 04/24/15 1538    PT SHORT TERM GOAL #1   Title independent with a stretching routine   Status Achieved           PT Long Term Goals - 05/08/15 1648    PT LONG TERM GOAL #1   Title decrease pain 50%   Status On-going   PT LONG TERM GOAL #2   Title increase lumbar  ROM 25%   Status Achieved               Plan - 05/08/15 1630    Clinical Impression Statement Patient with tightness of the piriformis mms.  She had increased pain and tenderness in the ITB and upper traps today.   PT Next Visit Plan again think about traction   Consulted and Agree with Plan of Care Patient        Problem List Patient Active Problem List   Diagnosis Date Noted  . Pre-ulcerative corn or callous 03/13/2015  . HTN (hypertension) 01/11/2015  . DM type 2 with diabetic peripheral neuropathy ([place]) 10/16/2014  . Left rib fracture 02/18/2014  . Skin infection 09/28/2013  . Otitis, externa, infective 09/28/2013  . Cough 01/11/2013  . Tinea cruris 07/27/2012  . Urinary frequency 07/27/2012  . Eczema 03/08/2012  . Headache(784.0) 03/08/2012  . Arm pain, right 08/24/2011  . Depression 07/16/2011  . Compression fracture of L2 lumbar vertebra ([place]) 06/08/2011  . Chronic insomnia 04/26/2011  . PPD  positive 04/17/2011  . URINARY INCONTINENCE, STRESS, FEMALE 04/29/2010  . MEMORY LOSS 12/23/2009  . Chronic bronchitis NEC 12/23/2009  . DIABETIC PERIPHERAL NEUROPATHY 06/28/2009  . Acute low back pain due to trauma 12/20/2008  . FIBROIDS, UTERUS 12/18/2008  . ABDOMINAL BLOATING 12/17/2008  . Constipation 01/24/2008  . GERD 01/23/2008  . SPRAIN&STRAIN OTHER SPECIFIED SITES KNEE&LEG 07/20/2007  . UTI 05/16/2007  . LACERATION OF FINGER 10/12/2006  . THYROID NODULE, RIGHT 08/05/2006  . DM (diabetes mellitus) type II uncontrolled, periph vascular disorder ([place]) 08/05/2006  . HYPERLIPIDEMIA 08/05/2006  . Essential hypertension 08/05/2006  . OSTEOPOROSIS NOS 08/05/2006    Sumner Boast., PT 05/08/2015, 4:49 PM  [place] [address] [place], [place], [address] Phone: [phone]   Fax:  [phone]  Name: Mary Flynn MRN: [id number] Date of Birth: [birth year]

## 2015-05-13 ENCOUNTER — Other Ambulatory Visit: Payer: Self-pay | Admitting: Family

## 2015-05-15 ENCOUNTER — Ambulatory Visit: Payer: Medicare Other | Admitting: Physical Therapy

## 2015-05-15 ENCOUNTER — Encounter: Payer: Self-pay | Admitting: Physical Therapy

## 2015-05-15 DIAGNOSIS — M797 Fibromyalgia: Secondary | ICD-10-CM

## 2015-05-15 DIAGNOSIS — M545 Low back pain, unspecified: Secondary | ICD-10-CM

## 2015-05-15 DIAGNOSIS — M542 Cervicalgia: Secondary | ICD-10-CM | POA: Diagnosis not present

## 2015-05-15 NOTE — Therapy (Signed)
[place], [place], [address] Phone: [phone]   Fax:  [phone]  Physical Therapy Treatment  Patient Details  Name: Mary Flynn MRN: [id number] Date of Birth: January 08, 1935 Referring Provider: Etter Sjogren  Encounter Date: 05/15/2015      PT End of Session - 05/15/15 1625    Visit Number 7   Date for PT Re-Evaluation 06/01/15   PT Start Time 1520   PT Stop Time [id number]   PT Time Calculation (min) 55 min   Activity Tolerance Patient tolerated treatment well   Behavior During Therapy [facility] for tasks assessed/performed      Past Medical History  Diagnosis Date  . Diabetes mellitus   . Hyperlipidemia   . Hypertension   . Left rib fracture 02/18/2014    Past Surgical History  Procedure Laterality Date  . Appendectomy    . Surgical sterilization  40 years ago  . Refractive surgery      Left Eye    There were no vitals filed for this visit.  Visit Diagnosis:  Neck pain  Bilateral low back pain without sciatica  Fibromyalgia      Subjective Assessment - 05/15/15 1623    Subjective Reporting that she has slept much better the past week, still has not walked and reports that she has done some of the HEP that was issued.   Currently in Pain? Yes   Pain Score 3    Pain Location Back   Aggravating Factors  houseowrk   Pain Relieving Factors treatment                         [facility] Adult PT Treatment/Exercise - 05/15/15 0001    Manual Therapy   Manual Therapy Soft tissue mobilization;Passive ROM   Soft tissue mobilization STM of the calves, thighs, back and neck   Passive ROM PROM of the LE's and trunk                  PT Short Term Goals - 04/24/15 1538    PT SHORT TERM GOAL #1   Title independent with a stretching routine   Status Achieved           PT Long Term Goals - 05/08/15 1648    PT LONG TERM GOAL #1   Title decrease pain 50%   Status  On-going   PT LONG TERM GOAL #2   Title increase lumbar ROM 25%   Status Achieved               Plan - 05/15/15 1625    Clinical Impression Statement Patient with some increased c/o pain in the shoulders and back, reports a "fibromyalgia flare".  She is tight in the mms with tenderness throughout   PT Next Visit Plan slowly try to have her walk or exercise   Consulted and Agree with Plan of Care Patient        Problem List Patient Active Problem List   Diagnosis Date Noted  . Pre-ulcerative corn or callous 03/13/2015  . HTN (hypertension) 01/11/2015  . DM type 2 with diabetic peripheral neuropathy ([place]) 10/16/2014  . Left rib fracture 02/18/2014  . Skin infection 09/28/2013  . Otitis, externa, infective 09/28/2013  . Cough 01/11/2013  . Tinea cruris 07/27/2012  . Urinary frequency 07/27/2012  . Eczema 03/08/2012  . Headache(784.0) 03/08/2012  . Arm pain, right 08/24/2011  . Depression 07/16/2011  .  Compression fracture of L2 lumbar vertebra ([place]) 06/08/2011  . Chronic insomnia 04/26/2011  . PPD positive 04/17/2011  . URINARY INCONTINENCE, STRESS, FEMALE 04/29/2010  . MEMORY LOSS 12/23/2009  . Chronic bronchitis NEC 12/23/2009  . DIABETIC PERIPHERAL NEUROPATHY 06/28/2009  . Acute low back pain due to trauma 12/20/2008  . FIBROIDS, UTERUS 12/18/2008  . ABDOMINAL BLOATING 12/17/2008  . Constipation 01/24/2008  . GERD 01/23/2008  . SPRAIN&STRAIN OTHER SPECIFIED SITES KNEE&LEG 07/20/2007  . UTI 05/16/2007  . LACERATION OF FINGER 10/12/2006  . THYROID NODULE, RIGHT 08/05/2006  . DM (diabetes mellitus) type II uncontrolled, periph vascular disorder ([place]) 08/05/2006  . HYPERLIPIDEMIA 08/05/2006  . Essential hypertension 08/05/2006  . OSTEOPOROSIS NOS 08/05/2006    Sumner Boast., PT 05/15/2015, 4:28 PM  [place] [address] [place], [place], [address] Phone: [phone]   Fax:   [phone]  Name: Mary Flynn MRN: [id number] Date of Birth: [birth year]

## 2015-05-16 DIAGNOSIS — I10 Essential (primary) hypertension: Secondary | ICD-10-CM | POA: Diagnosis not present

## 2015-05-16 DIAGNOSIS — M5126 Other intervertebral disc displacement, lumbar region: Secondary | ICD-10-CM | POA: Diagnosis not present

## 2015-05-16 DIAGNOSIS — M549 Dorsalgia, unspecified: Secondary | ICD-10-CM | POA: Diagnosis not present

## 2015-05-19 ENCOUNTER — Other Ambulatory Visit (HOSPITAL_BASED_OUTPATIENT_CLINIC_OR_DEPARTMENT_OTHER): Payer: Self-pay

## 2015-05-22 ENCOUNTER — Encounter: Payer: Self-pay | Admitting: Physical Therapy

## 2015-05-22 ENCOUNTER — Ambulatory Visit: Payer: Medicare Other | Admitting: Physical Therapy

## 2015-05-22 DIAGNOSIS — M545 Low back pain, unspecified: Secondary | ICD-10-CM

## 2015-05-22 DIAGNOSIS — M797 Fibromyalgia: Secondary | ICD-10-CM

## 2015-05-22 DIAGNOSIS — M542 Cervicalgia: Secondary | ICD-10-CM

## 2015-05-22 NOTE — Therapy (Signed)
[place] [address] [place], [place], [address] Phone: [phone]   Fax:  [phone]  Physical Therapy Treatment  Patient Details  Name: Mary Flynn MRN: [id number] Date of Birth: [birth year] Referring Provider: Etter Sjogren  Encounter Date: 05/22/2015      PT End of Session - 05/22/15 1744    Visit Number 8   Date for PT Re-Evaluation 06/01/15   PT Start Time 1520   PT Stop Time 1608   PT Time Calculation (min) 48 min   Activity Tolerance Patient limited by pain   Behavior During Therapy [facility] for tasks assessed/performed      Past Medical History  Diagnosis Date  . Diabetes mellitus   . Hyperlipidemia   . Hypertension   . Left rib fracture 02/18/2014    Past Surgical History  Procedure Laterality Date  . Appendectomy    . Surgical sterilization  40 years ago  . Refractive surgery      Left Eye    There were no vitals filed for this visit.  Visit Diagnosis:  Neck pain  Bilateral low back pain without sciatica  Fibromyalgia      Subjective Assessment - 05/22/15 1742    Subjective Patient met with neurosurgeon yesterday, recommended PT, feels that she is not a good candidate for surgery   Currently in Pain? Yes   Pain Score 3    Pain Location Back   Pain Orientation Lower   Aggravating Factors  standing and walking   Pain Relieving Factors PT                         [facility] Adult PT Treatment/Exercise - 05/22/15 0001    Exercises   Exercises Lumbar   Lumbar Exercises: Machines for Strengthening   Cybex Knee Extension 5# 2x10   Cybex Knee Flexion 20# 2x10   Other Lumbar Machine Exercise seated row 10# 2x10   Manual Therapy   Manual Therapy Soft tissue mobilization;Passive ROM   Soft tissue mobilization STM of the calves, thighs, back and neck   Passive ROM PROM of the LE's and trunk                  PT Short Term Goals - 04/24/15 1538    PT SHORT TERM GOAL #1    Title independent with a stretching routine   Status Achieved           PT Long Term Goals - 05/22/15 1745    PT LONG TERM GOAL #1   Title decrease pain 50%   Status On-going   PT LONG TERM GOAL #2   Title increase lumbar ROM 25%   Status Achieved   PT LONG TERM GOAL #3   Title increase cervical ROM 25%   Status On-going   PT LONG TERM GOAL #4   Title be able to clean her house without increase of pain > 4/10   Status On-going               Plan - 05/22/15 1745    Clinical Impression Statement Tried some exercises today, dod well, no increase of pain.  she report sthat she "need to walk more"   PT Next Visit Plan slowly try to have her walk or exercise   Consulted and Agree with Plan of Care Patient        Problem List Patient Active Problem List   Diagnosis Date Noted  .  Pre-ulcerative corn or callous 03/13/2015  . HTN (hypertension) 01/11/2015  . DM type 2 with diabetic peripheral neuropathy ([place]) 10/16/2014  . Left rib fracture 02/18/2014  . Skin infection 09/28/2013  . Otitis, externa, infective 09/28/2013  . Cough 01/11/2013  . Tinea cruris 07/27/2012  . Urinary frequency 07/27/2012  . Eczema 03/08/2012  . Headache(784.0) 03/08/2012  . Arm pain, right 08/24/2011  . Depression 07/16/2011  . Compression fracture of L2 lumbar vertebra ([place]) 06/08/2011  . Chronic insomnia 04/26/2011  . PPD positive 04/17/2011  . URINARY INCONTINENCE, STRESS, FEMALE 04/29/2010  . MEMORY LOSS 12/23/2009  . Chronic bronchitis NEC 12/23/2009  . DIABETIC PERIPHERAL NEUROPATHY 06/28/2009  . Acute low back pain due to trauma 12/20/2008  . FIBROIDS, UTERUS 12/18/2008  . ABDOMINAL BLOATING 12/17/2008  . Constipation 01/24/2008  . GERD 01/23/2008  . SPRAIN&STRAIN OTHER SPECIFIED SITES KNEE&LEG 07/20/2007  . UTI 05/16/2007  . LACERATION OF FINGER 10/12/2006  . THYROID NODULE, RIGHT 08/05/2006  . DM (diabetes mellitus) type II uncontrolled, periph vascular disorder  ([place]) 08/05/2006  . HYPERLIPIDEMIA 08/05/2006  . Essential hypertension 08/05/2006  . OSTEOPOROSIS NOS 08/05/2006    Sumner Boast., PT 05/22/2015, 5:46 PM  [place] [address] [place], [place], [address] Phone: [phone]   Fax:  [phone]  Name: Mary Flynn MRN: [id number] Date of Birth: Apr 19, 1934

## 2015-05-23 DIAGNOSIS — Z1231 Encounter for screening mammogram for malignant neoplasm of breast: Secondary | ICD-10-CM | POA: Diagnosis not present

## 2015-05-23 LAB — HM MAMMOGRAPHY

## 2015-05-29 ENCOUNTER — Ambulatory Visit: Payer: Medicare Other | Admitting: Physical Therapy

## 2015-05-31 ENCOUNTER — Ambulatory Visit: Payer: Self-pay

## 2015-06-05 ENCOUNTER — Encounter: Payer: Self-pay | Admitting: Physical Therapy

## 2015-06-05 ENCOUNTER — Telehealth: Payer: Self-pay | Admitting: Endocrinology

## 2015-06-05 ENCOUNTER — Ambulatory Visit: Payer: Medicare Other | Attending: Medical | Admitting: Physical Therapy

## 2015-06-05 ENCOUNTER — Other Ambulatory Visit: Payer: Self-pay | Admitting: Family Medicine

## 2015-06-05 ENCOUNTER — Encounter: Payer: Self-pay | Admitting: Family Medicine

## 2015-06-05 DIAGNOSIS — M545 Low back pain, unspecified: Secondary | ICD-10-CM

## 2015-06-05 DIAGNOSIS — M542 Cervicalgia: Secondary | ICD-10-CM | POA: Insufficient documentation

## 2015-06-05 NOTE — Therapy (Signed)
[place], [place], [address] Phone: [phone]   Fax:  [phone]  Physical Therapy Treatment  Patient Details  Name: Mary Flynn MRN: [id number] Date of Birth: 07-19-1934 Referring Provider: Etter Sjogren  Encounter Date: 06/05/2015      PT End of Session - 06/05/15 1706    Visit Number 9   Date for PT Re-Evaluation 06/01/15   PT Start Time 1515   PT Stop Time 1600   PT Time Calculation (min) 45 min   Activity Tolerance Patient limited by pain   Behavior During Therapy [facility] for tasks assessed/performed      Past Medical History  Diagnosis Date  . Diabetes mellitus   . Hyperlipidemia   . Hypertension   . Left rib fracture 02/18/2014    Past Surgical History  Procedure Laterality Date  . Appendectomy    . Surgical sterilization  40 years ago  . Refractive surgery      Left Eye    There were no vitals filed for this visit.      Subjective Assessment - 06/05/15 1704    Subjective Patient had to miss the last week or so due to her husband being in the hospital, reports increased stress and worry with increased back pain   Currently in Pain? Yes   Pain Score 6    Pain Location Back   Pain Orientation Lower   Aggravating Factors  standing, worry, stress   Pain Relieving Factors treatment has helped                         [facility] Adult PT Treatment/Exercise - 06/05/15 0001    Manual Therapy   Manual Therapy Soft tissue mobilization;Passive ROM   Soft tissue mobilization STM of the calves, thighs, back and neck   Passive ROM PROM of the LE's and trunk                  PT Short Term Goals - 04/24/15 1538    PT SHORT TERM GOAL #1   Title independent with a stretching routine   Status Achieved           PT Long Term Goals - 06/05/15 1711    PT LONG TERM GOAL #1   Title decrease pain 50%   Status On-going   PT LONG TERM GOAL #2   Title increase  lumbar ROM 25%   Status Achieved   PT LONG TERM GOAL #3   Title increase cervical ROM 25%   Status Partially Met   PT LONG TERM GOAL #4   Title be able to clean her house without increase of pain > 4/10   Status Partially Met               Plan - 06/05/15 1707    Clinical Impression Statement Patient with increased stress with husband being in the hospital, now with increased pain and increased spasms throught   PT Next Visit Plan slowly try to have her walk or exercise   Consulted and Agree with Plan of Care Patient      Patient will benefit from skilled therapeutic intervention in order to improve the following deficits and impairments:     Visit Diagnosis: Neck pain - Plan: PT plan of care cert/re-cert  Bilateral low back pain without sciatica - Plan: PT plan of care cert/re-cert     Problem List Patient Active Problem  List   Diagnosis Date Noted  . Pre-ulcerative corn or callous 03/13/2015  . HTN (hypertension) 01/11/2015  . DM type 2 with diabetic peripheral neuropathy ([place]) 10/16/2014  . Left rib fracture 02/18/2014  . Skin infection 09/28/2013  . Otitis, externa, infective 09/28/2013  . Cough 01/11/2013  . Tinea cruris 07/27/2012  . Urinary frequency 07/27/2012  . Eczema 03/08/2012  . Headache(784.0) 03/08/2012  . Arm pain, right 08/24/2011  . Depression 07/16/2011  . Compression fracture of L2 lumbar vertebra ([place]) 06/08/2011  . Chronic insomnia 04/26/2011  . PPD positive 04/17/2011  . URINARY INCONTINENCE, STRESS, FEMALE 04/29/2010  . MEMORY LOSS 12/23/2009  . Chronic bronchitis NEC 12/23/2009  . DIABETIC PERIPHERAL NEUROPATHY 06/28/2009  . Acute low back pain due to trauma 12/20/2008  . FIBROIDS, UTERUS 12/18/2008  . ABDOMINAL BLOATING 12/17/2008  . Constipation 01/24/2008  . GERD 01/23/2008  . SPRAIN&STRAIN OTHER SPECIFIED SITES KNEE&LEG 07/20/2007  . UTI 05/16/2007  . LACERATION OF FINGER 10/12/2006  . THYROID NODULE, RIGHT 08/05/2006  .  DM (diabetes mellitus) type II uncontrolled, periph vascular disorder ([place]) 08/05/2006  . HYPERLIPIDEMIA 08/05/2006  . Essential hypertension 08/05/2006  . OSTEOPOROSIS NOS 08/05/2006    Sumner Boast., PT 06/05/2015, 5:14 PM  [place] [address] [place], [place], [address] Phone: [phone]   Fax:  [phone]  Name: TAMEYA KUZNIA MRN: [id number] Date of Birth: [birth year]

## 2015-06-10 ENCOUNTER — Telehealth: Payer: Self-pay | Admitting: Family Medicine

## 2015-06-10 MED ORDER — LEVOCETIRIZINE DIHYDROCHLORIDE 5 MG PO TABS: 5.0000 mg | ORAL_TABLET | Freq: Every evening | ORAL | Status: DC

## 2015-06-10 NOTE — Telephone Encounter (Signed)
Relation to [id number] Call back [phone]   Reason for call:  Patient requesting a new prescription stating 3 month supply of levocetirizine (XYZAL) 5 MG tablet please send to  [facility], [place] [phone] (Phone) [phone] (Fax)       Patient states Percell Miller prescribed.

## 2015-06-10 NOTE — Telephone Encounter (Signed)
Spoke with pt and she voices understanding on note below.

## 2015-06-11 ENCOUNTER — Ambulatory Visit: Payer: Self-pay

## 2015-06-11 ENCOUNTER — Encounter: Payer: Self-pay | Admitting: Endocrinology

## 2015-06-11 ENCOUNTER — Ambulatory Visit (INDEPENDENT_AMBULATORY_CARE_PROVIDER_SITE_OTHER): Payer: Medicare Other | Admitting: Endocrinology

## 2015-06-11 VITALS — BP 128/78 | HR 72 | Temp 97.8°F | Resp 14 | Ht 59.0 in | Wt 127.6 lb

## 2015-06-11 DIAGNOSIS — Z794 Long term (current) use of insulin: Secondary | ICD-10-CM | POA: Diagnosis not present

## 2015-06-11 DIAGNOSIS — E1165 Type 2 diabetes mellitus with hyperglycemia: Secondary | ICD-10-CM | POA: Diagnosis not present

## 2015-06-11 NOTE — Patient Instructions (Signed)
Check sugar 2 hours after dinner daily/bedtime and more after lunch  Levemir 17 units and keep am  Sugar under 120   More protein at each meals  Take Invokana before Bfst and reduce insulin 2 units at least when sugar <100

## 2015-06-11 NOTE — Progress Notes (Signed)
Patient ID: Mary Flynn, female   DOB: Feb 22, 1935, 81 y.o.   MRN: [id number]   Reason for Appointment: Diabetes follow-up   History of Present Illness   Diagnosis: Type 2 DIABETES MELITUS, date of diagnosis:   1995    Previous history: She has been on various regimens of hypoglycemic drugs over the last several years including Actos, Amaryl and Januvia. Because of cost her Januvia was stopped and Actos was stopped because of potential weight gain She was initially started on Levemir insulin because of fasting hyperglycemia in 2009 and the dose has been adjusted periodically In the last year she is also quite mealtime coverage because of postprandial hyperglycemia despite usually eating a low carbohydrate diet  Insulin regimen: Levemir 16 units hs;  Novolog 5-7 before meals based on meal size    Recent history:   She has not been regular with her follow-up and was scheduled as a working because of variable sugars  Her A1c has been persistently over 8%  Previously she was recommended Invokana but this is too expensive for her and she is still hesitating about going to the pharmacy to get it  Current blood sugar patterns and problems identified:  FASTING blood sugars are inconsistent and mostly high, averaging about 160  She has marked variability in her blood sugars in the afternoons and evenings , these are checked randomly and not after meals , occasionally sugars are check before supper but not AFTER   more recently she says she is not eating any significant amount of breakfast except a small piece of toast  She thinks she feels weak and groggy/lightheaded when her blood sugars are in the 70s and 80s and she will treat this with food.  She does not do why her blood sugars were over 200 this past weekend in the afternoons  Despite reminders she does not check her blood sugars after supper  Not always getting protein with every meal, still has a vegetarian diet and  sometimes may get some tofu or beans   She has not been able to exercise because of fibromyalgia and other problems with neck pain.  She thinks her sugars are higher with the pain   she does not mark her postprandial readings on the monitor  Hypoglycemia :  none documented but she feels some weakness when blood sugars are in the 70s and 80s  Oral hypoglycemic drugs: Amaryl, metformin  Side effects from medications: None.          Proper timing of medications in relation to meals: She is taking her NovoLog right after eating       Monitors blood glucose: Once or twice a day.    Glucometer:  One Touch       Blood Glucose readings from meter download:   Mean values apply above for all meters except median for One Touch  PRE-MEAL Fasting Lunch Dinner  overnight Overall  Glucose range:  116- 202  183- 265  77- 220  60-126   Mean/median:  160     159 +/-52   POST-MEAL PC Breakfast PC Lunch PC Dinner  Glucose range:  85, 88   ?  245  Mean/median:      Meals: 3 meals per day. she is a vegetarian. Trying to get some protein at most meals including dairy products and tofu  Has Bfst 10 am,  lunch around 2 PM, dinner at 8 PM      Physical activity: exercise: walking  2 miles and Gym periodically , less recently       Last visit with dietitian: 2009   Wt Readings from Last 3 Encounters:  06/11/15 127 lb 9.6 oz (57.879 kg)  05/07/15 129 lb (58.514 kg)  04/30/15 127 lb 12.8 oz (57.97 kg)    LABS:  Lab Results  Component Value Date   HGBA1C 8.3* 03/22/2015   HGBA1C 8.3* 03/12/2015   HGBA1C 9.1* 09/25/2014   Lab Results  Component Value Date   MICROALBUR 7.2* 05/22/2014   LDLCALC 35 03/12/2015   CREATININE 0.63 03/22/2015       Medication List       This list is accurate as of: 06/11/15 11:39 AM.  Always use your most recent med list.               amLODipine 5 MG tablet  Commonly known as:  NORVASC  Take 1 tablet (5 mg total) by mouth daily.     aspirin EC 81 MG tablet   Take 81 mg by mouth daily.     atorvastatin 10 MG tablet  Commonly known as:  LIPITOR  Take 1 tablet by mouth  every day     azelastine 0.1 % nasal spray  Commonly known as:  ASTELIN  Place 2 sprays into both nostrils 2 (two) times daily. Use in each nostril as directed     b complex vitamins tablet  Take 1 tablet by mouth daily.     baclofen 20 MG tablet  Commonly known as:  LIORESAL  Take 1 tablet by mouth 3  times daily     canagliflozin 100 MG Tabs tablet  Commonly known as:  INVOKANA  Take 1 tablet (100 mg total) by mouth daily before breakfast.     doxycycline 100 MG tablet  Commonly known as:  VIBRA-TABS  Take 1 tablet (100 mg total) by mouth 2 (two) times daily.     fluticasone 50 MCG/ACT nasal spray  Commonly known as:  FLONASE  Place 2 sprays into both nostrils daily.     gabapentin 300 MG capsule  Commonly known as:  NEURONTIN  Take 1 capsule by mouth 3  times daily     glimepiride 4 MG tablet  Commonly known as:  AMARYL  Take 1 tablet by mouth two  times daily     glucose blood test strip  Commonly known as:  ONE TOUCH ULTRA TEST  Use to test blood sugar 2 times daily as instructed. Dx code: E11.49     ONE TOUCH ULTRA TEST test strip  Generic drug:  glucose blood  Test two times daily     HYDROcodone-acetaminophen 5-325 MG tablet  Commonly known as:  NORCO/VICODIN  Take 1 tablet by mouth every 8 (eight) hours as needed for moderate pain.     HYDROcodone-homatropine 5-1.5 MG/5ML syrup  Commonly known as:  HYCODAN  Take 5 mLs by mouth every 8 (eight) hours as needed for cough.     Insulin Detemir 100 UNIT/ML Pen  Commonly known as:  LEVEMIR FLEXPEN  Inject 14 Units into the skin at bedtime.     insulin lispro 100 UNIT/ML KiwkPen  Commonly known as:  HUMALOG KWIKPEN  Inject 4 units at breakfast, 6 units at lunch and 4 units at dinner.     Insulin Pen Needle 32G X 4 MM Misc  Use 3 per day     levocetirizine 5 MG tablet  Commonly known as:   XYZAL  Take 1 tablet (  5 mg total) by mouth every evening.     meloxicam 15 MG tablet  Commonly known as:  MOBIC  Take 1 tablet (15 mg total) by mouth daily.     metFORMIN 1000 MG tablet  Commonly known as:  GLUCOPHAGE  Take 1 tablet by mouth  twice a day with meals     metoprolol succinate 100 MG 24 hr tablet  Commonly known as:  TOPROL-XL  Take 1 tablet (100 mg total) by mouth daily. Take with or immediately following a meal.     Niacin CR 1000 MG Tbcr  1 po bid     ONE TOUCH ULTRA 2 w/Device Kit  Use to test blood sugar 2 times daily as instructed. Dx code: E11.49     oxybutynin 5 MG tablet  Commonly known as:  DITROPAN  Take 1 tablet (5 mg total) by mouth 2 (two) times daily.     pantoprazole 40 MG tablet  Commonly known as:  PROTONIX  Take 1 tablet by mouth  daily     polyethylene glycol powder powder  Commonly known as:  GLYCOLAX/MIRALAX  Using measuring cap mix  17gm in water and drink  daily     traMADol 50 MG tablet  Commonly known as:  ULTRAM  Take 1 tablet (50 mg total) by mouth every 6 (six) hours as needed.     valsartan 320 MG tablet  Commonly known as:  DIOVAN  Take 1 tablet by mouth  daily        Allergies:  Allergies  Allergen Reactions  . Ace Inhibitors Cough  . Losartan Cough  . Penicillins Hives  . Sulfa Antibiotics Hives  . Sulfonamide Derivatives     Past Medical History  Diagnosis Date  . Diabetes mellitus   . Hyperlipidemia   . Hypertension   . Left rib fracture 02/18/2014    Past Surgical History  Procedure Laterality Date  . Appendectomy    . Surgical sterilization  40 years ago  . Refractive surgery      Left Eye    Family History  Problem Relation Age of Onset  . Depression    . Cancer      laryngeal  . Diabetes    . Diabetes Mother     Social History:  reports that she has never smoked. She has never used smokeless tobacco. She reports that she does not drink alcohol or use illicit drugs.  Review of  Systems:  Hypertension:    On  Diovan 320 mg, metoprolol and amlodipine, followed by PCP Blood pressure appears to be better today She does not monitor at home regularly and blood pressure is followed by PCP  Hyponatremia: This is variable and unrelated to medications , improved now  Lab Results  Component Value Date   CREATININE 0.63 03/22/2015   BUN 13 03/22/2015   NA 135 03/22/2015   K 4.1 03/22/2015   CL 98 03/22/2015   CO2 29 03/22/2015     Lipids: Has had diabetic dyslipidemia,  well controlled and HDL is also fairly good  Lab Results  Component Value Date   CHOL 124 03/12/2015   HDL 49.50 03/12/2015   LDLCALC 35 03/12/2015   TRIG 197.0* 03/12/2015   CHOLHDL 3 03/12/2015    Neuropathy: She previously had sharp pains in her lower legs but no numbness.still on gabapentin, no symptoms currently   Diabetic foot exam done in 9/16  Diabetic foot exam showed normal monofilament sensation in the toes and  plantar surfaces, no skin lesions or ulcers on the feet and normal pedal pulses  She has been regular with her eye exams annually    Examination:   BP 128/78 mmHg  Pulse 72  Temp(Src) 97.8 F (36.6 C)  Resp 14  Ht _0  (1.499 m)  Wt 127 lb 9.6 oz (57.879 kg)  BMI 25.76 kg/m2  SpO2 97%  Body mass index is 25.76 kg/(m^2).     ASSESSMENT/ PLAN:   Diabetes type 2  With BMI 26  See history of present illness for detailed discussion of  current management, blood sugar patterns and problems identified   She continues to have variable control of her blood sugars.  With checking her blood sugars mostly in the mornings difficult to know how much hyperglycemia she has after meals but her A1c is typically over 8%  She has periodically higher readings which she thinks is from various kinds of pains and fibromyalgia  Blood sugars May tend to be low normal around 6 PM and occasionally during the night and not clear why   More recently has not been consistent with her  diet and may not always get protein with every meal    She does need to check more readings consistently after meals   Although she is a good candidate for assessing her blood sugar patterns with the continuous glucose monitoring she is planning to travel very soon and will lead to do this after she comes back.  Since her recent fasting readings are consistently over 140 she can increase her Levemir by at least 1 unit   Consider changing to [place]    she must check sugars 2 hours after her meals especially at dinnertime to assess her Novolog dosage    Resume exercise when able to   Discussed that if she is able to afford Invokana she can start taking this in the morning and watch her blood sugars closely as she will need to reduce her Levemir by 2-3 units at least and mealtime insulin by about 2 units   Other active medical problems: She will review with PCP   Patient Instructions  Check sugar 2 hours after dinner daily/bedtime and more after lunch  Levemir 17 units and keep am  Sugar under 120   More protein at each meals  Take Invokana before Bfst and reduce insulin 2 units at least when sugar <100         Counseling time on subjects discussed above is over 50% of today's 25 minute visit   Easter [place] 06/11/2015, 11:39 AM

## 2015-06-12 ENCOUNTER — Telehealth: Payer: Self-pay | Admitting: Endocrinology

## 2015-06-12 ENCOUNTER — Ambulatory Visit: Payer: Medicare Other | Admitting: Physical Therapy

## 2015-06-12 ENCOUNTER — Encounter: Payer: Self-pay | Admitting: Physical Therapy

## 2015-06-12 ENCOUNTER — Telehealth: Payer: Self-pay | Admitting: Family Medicine

## 2015-06-12 DIAGNOSIS — M545 Low back pain: Secondary | ICD-10-CM | POA: Diagnosis not present

## 2015-06-12 DIAGNOSIS — M542 Cervicalgia: Secondary | ICD-10-CM | POA: Diagnosis not present

## 2015-06-12 DIAGNOSIS — M1710 Unilateral primary osteoarthritis, unspecified knee: Secondary | ICD-10-CM

## 2015-06-12 DIAGNOSIS — IMO0002 Reserved for concepts with insufficient information to code with codable children: Secondary | ICD-10-CM

## 2015-06-12 NOTE — Telephone Encounter (Signed)
Last OV: 05/07/15 w/ Edward, 04/12/15 w/ Dr. Carollee Herter Last filled: 05/11/14, #180, 1 RF Sig: Take 1 tablet (50 mg total) by mouth every 6 (six) hours as needed. UDS: Not on file

## 2015-06-12 NOTE — Therapy (Signed)
[place] [address] [place], [place], [address] Phone: [phone]   Fax:  [phone]  Physical Therapy Treatment  Patient Details  Name: Mary Flynn MRN: [id number] Date of Birth: Jul 15, 1934 Referring Provider: Etter Sjogren  Encounter Date: 06/12/2015      PT End of Session - 06/12/15 1640    Visit Number 10   Date for PT Re-Evaluation 07/05/15   PT Start Time 1525   PT Stop Time 1615   PT Time Calculation (min) 50 min   Activity Tolerance Patient limited by pain   Behavior During Therapy [facility] for tasks assessed/performed      Past Medical History  Diagnosis Date  . Diabetes mellitus   . Hyperlipidemia   . Hypertension   . Left rib fracture 02/18/2014    Past Surgical History  Procedure Laterality Date  . Appendectomy    . Surgical sterilization  40 years ago  . Refractive surgery      Left Eye    There were no vitals filed for this visit.      Subjective Assessment - 06/12/15 1612    Subjective Patient reports still with a lot more pain in the back and legs since her husband was in the hospital, reports increased dificulty walking due to pain   Currently in Pain? Yes   Pain Score 7    Pain Location Back            [facility] PT Assessment - 06/12/15 0001    AROM   Overall AROM Comments AROM of the lumbar spine is decreased 25% with c/o pain in the low back, cervical ROM was decreased 25%                     [facility] Adult PT Treatment/Exercise - 06/12/15 0001    Manual Therapy   Manual Therapy Soft tissue mobilization;Passive ROM   Soft tissue mobilization STM of the calves, thighs, back and neck   Passive ROM PROM of the LE's and trunk                  PT Short Term Goals - 04/24/15 1538    PT SHORT TERM GOAL #1   Title independent with a stretching routine   Status Achieved           PT Long Term Goals - 06/12/15 1646    PT LONG TERM GOAL #1   Title decrease  pain 50%   Status On-going   PT LONG TERM GOAL #2   Title increase lumbar ROM 25%   Status Achieved   PT LONG TERM GOAL #3   Title increase cervical ROM 25%   Status Achieved               Plan - 06/12/15 1640    Clinical Impression Statement Continues to have increased pain with tenderness throughout the back, neck and leg mms.  Her ROM is overall improved and had been trying to progress to gym and walking exercises but has not been able to the last two visits   PT Next Visit Plan again will try to get her to exercise   Consulted and Agree with Plan of Care Patient      Patient will benefit from skilled therapeutic intervention in order to improve the following deficits and impairments:  Decreased activity tolerance, Decreased mobility, Decreased range of motion, Decreased strength, Difficulty walking, Increased muscle spasms, Impaired flexibility, Postural dysfunction,  Pain  Visit Diagnosis: Cervicalgia       G-Codes - 07/05/15 1647    Functional Assessment Tool Used  foto 59% limitation   Functional Limitation Other PT primary   Other PT Primary Current Status [id number]) At least 40 percent but less than 60 percent impaired, limited or restricted   Other PT Primary Goal Status [id number]) At least 40 percent but less than 60 percent impaired, limited or restricted      Problem List Patient Active Problem List   Diagnosis Date Noted  . Pre-ulcerative corn or callous 03/13/2015  . HTN (hypertension) 01/11/2015  . DM type 2 with diabetic peripheral neuropathy ([place]) 10/16/2014  . Left rib fracture 02/18/2014  . Skin infection 09/28/2013  . Otitis, externa, infective 09/28/2013  . Cough 01/11/2013  . Tinea cruris 07/27/2012  . Urinary frequency 07/27/2012  . Eczema 03/08/2012  . Headache(784.0) 03/08/2012  . Arm pain, right 08/24/2011  . Depression 07/16/2011  . Compression fracture of L2 lumbar vertebra ([place]) 06/08/2011  . Chronic insomnia 04/26/2011  . PPD positive  04/17/2011  . URINARY INCONTINENCE, STRESS, FEMALE 04/29/2010  . MEMORY LOSS 12/23/2009  . Chronic bronchitis NEC 12/23/2009  . DIABETIC PERIPHERAL NEUROPATHY 06/28/2009  . Acute low back pain due to trauma 12/20/2008  . FIBROIDS, UTERUS 12/18/2008  . ABDOMINAL BLOATING 12/17/2008  . Constipation 01/24/2008  . GERD 01/23/2008  . SPRAIN&STRAIN OTHER SPECIFIED SITES KNEE&LEG 07/20/2007  . UTI 05/16/2007  . LACERATION OF FINGER 10/12/2006  . THYROID NODULE, RIGHT 08/05/2006  . DM (diabetes mellitus) type II uncontrolled, periph vascular disorder ([place]) 08/05/2006  . HYPERLIPIDEMIA 08/05/2006  . Essential hypertension 08/05/2006  . OSTEOPOROSIS NOS 08/05/2006    Sumner Boast., PT 07/05/15, 4:58 PM  [place] [address] [place], [place], [address] Phone: [phone]   Fax:  [phone]  Name: Mary Flynn MRN: [id number] Date of Birth: 01-09-1935

## 2015-06-12 NOTE — Telephone Encounter (Signed)
°  Relation to [id number] Call back [place]: [place], [place] [phone] (Phone) [phone] (Fax)         Reason for call:  Patient requesting a 3 month supply traMADol (ULTRAM) 50 MG tablet due to patient going out of town.

## 2015-06-12 NOTE — Telephone Encounter (Signed)
Pt requests call back from you

## 2015-06-13 ENCOUNTER — Ambulatory Visit (INDEPENDENT_AMBULATORY_CARE_PROVIDER_SITE_OTHER): Payer: Medicare Other

## 2015-06-13 VITALS — BP 130/70 | HR 77 | Ht 59.0 in | Wt 129.0 lb

## 2015-06-13 DIAGNOSIS — Z79891 Long term (current) use of opiate analgesic: Secondary | ICD-10-CM | POA: Diagnosis not present

## 2015-06-13 DIAGNOSIS — Z0000 Encounter for general adult medical examination without abnormal findings: Secondary | ICD-10-CM

## 2015-06-13 MED ORDER — TRAMADOL HCL 50 MG PO TABS: 50.0000 mg | ORAL_TABLET | Freq: Four times a day (QID) | ORAL | Status: DC | PRN

## 2015-06-13 NOTE — Patient Instructions (Addendum)
Continue to monitor blood sugars as advised by provider.  Continue doing brain stimulating activities (puzzles, reading, sewing, adult coloring books, staying active) to keep memory sharp.   Watch simple carb intake and drink plenty of water.    Continue to walk every day.    Schedule follow up appointment with Dr. Etter Sjogren.       Bone Densitometry Bone densitometry is an imaging test that uses a special X-ray to measure the amount of calcium and other minerals in your bones (bone density). This test is also known as a bone mineral density test or dual-energy X-ray absorptiometry (DXA). The test can measure bone density at your hip and your spine. It is similar to having a regular X-ray. You may have this test to:  Diagnose a condition that causes weak or thin bones (osteoporosis).  Predict your risk of a broken bone (fracture).  Determine how well osteoporosis treatment is working. LET [facility] CARE PROVIDER KNOW ABOUT:  Any allergies you have.  All medicines you are taking, including vitamins, herbs, eye drops, creams, and over-the-counter medicines.  Previous problems you or members of your family have had with the use of anesthetics.  Any blood disorders you have.  Previous surgeries you have had.  Medical conditions you have.  Possibility of pregnancy.  Any other medical test you had within the previous 14 days that used contrast material. RISKS AND COMPLICATIONS Generally, this is a safe procedure. However, problems can occur and may include the following:  This test exposes you to a very small amount of radiation.  The risks of radiation exposure may be greater to unborn children. BEFORE THE PROCEDURE  Do not take any calcium supplements for 24 hours before having the test. You can otherwise eat and drink what you usually do.  Take off all metal jewelry, eyeglasses, dental appliances, and any other metal objects. PROCEDURE  You may lie on an exam table.  There will be an X-ray generator below you and an imaging device above you.  Other devices, such as boxes or braces, may be used to position your body properly for the scan.  You will need to lie still while the machine slowly scans your body.  The images will show up on a computer monitor. AFTER THE PROCEDURE You may need more testing at a later time.   This information is not intended to replace advice given to you by your health care provider. Make sure you discuss any questions you have with your health care provider.   Document Released: 03/03/2004 Document Revised: 03/02/2014 Document Reviewed: 07/20/2013 Elsevier Interactive Patient Education 2016 [place].  Diabetes and Foot Care Diabetes may cause you to have problems because of poor blood supply (circulation) to your feet and legs. This may cause the skin on your feet to become thinner, break easier, and heal more slowly. Your skin may become dry, and the skin may peel and crack. You may also have nerve damage in your legs and feet causing decreased feeling in them. You may not notice minor injuries to your feet that could lead to infections or more serious problems. Taking care of your feet is one of the most important things you can do for yourself.  HOME CARE INSTRUCTIONS  Wear shoes at all times, even in the house. Do not go barefoot. Bare feet are easily injured.  Check your feet daily for blisters, cuts, and redness. If you cannot see the bottom of your feet, use a mirror or ask  someone for help.  Wash your feet with warm water (do not use hot water) and mild soap. Then pat your feet and the areas between your toes until they are completely dry. Do not soak your feet as this can dry your skin.  Apply a moisturizing lotion or petroleum jelly (that does not contain alcohol and is unscented) to the skin on your feet and to dry, brittle toenails. Do not apply lotion between your toes.  Trim your toenails straight across.  Do not dig under them or around the cuticle. File the edges of your nails with an emery board or nail file.  Do not cut corns or calluses or try to remove them with medicine.  Wear clean socks or stockings every day. Make sure they are not too tight. Do not wear knee-high stockings since they may decrease blood flow to your legs.  Wear shoes that fit properly and have enough cushioning. To break in new shoes, wear them for just a few hours a day. This prevents you from injuring your feet. Always look in your shoes before you put them on to be sure there are no objects inside.  Do not cross your legs. This may decrease the blood flow to your feet.  If you find a minor scrape, cut, or break in the skin on your feet, keep it and the skin around it clean and dry. These areas may be cleansed with mild soap and water. Do not cleanse the area with peroxide, alcohol, or iodine.  When you remove an adhesive bandage, be sure not to damage the skin around it.  If you have a wound, look at it several times a day to make sure it is healing.  Do not use heating pads or hot water bottles. They may burn your skin. If you have lost feeling in your feet or legs, you may not know it is happening until it is too late.  Make sure your health care provider performs a complete foot exam at least annually or more often if you have foot problems. Report any cuts, sores, or bruises to your health care provider immediately. SEEK MEDICAL CARE IF:   You have an injury that is not healing.  You have cuts or breaks in the skin.  You have an ingrown nail.  You notice redness on your legs or feet.  You feel burning or tingling in your legs or feet.  You have pain or cramps in your legs and feet.  Your legs or feet are numb.  Your feet always feel cold. SEEK IMMEDIATE MEDICAL CARE IF:   There is increasing redness, swelling, or pain in or around a wound.  There is a red line that goes up your leg.  Pus is  coming from a wound.  You develop a fever or as directed by your health care provider.  You notice a bad smell coming from an ulcer or wound.   This information is not intended to replace advice given to you by your health care provider. Make sure you discuss any questions you have with your health care provider.   Document Released: 02/07/2000 Document Revised: 10/12/2012 Document Reviewed: 07/19/2012 Elsevier Interactive Patient Education 2016 [place] in the Home  Falls can cause injuries. They can happen to people of all ages. There are many things you can do to make your home safe and to help prevent falls.  WHAT CAN I DO ON THE OUTSIDE OF MY HOME?  Regularly fix the edges of walkways and driveways and fix any cracks.  Remove anything that might make you trip as you walk through a door, such as a raised step or threshold.  Trim any bushes or trees on the path to your home.  Use bright outdoor lighting.  Clear any walking paths of anything that might make someone trip, such as rocks or tools.  Regularly check to see if handrails are loose or broken. Make sure that both sides of any steps have handrails.  Any raised decks and porches should have guardrails on the edges.  Have any leaves, snow, or ice cleared regularly.  Use sand or salt on walking paths during winter.  Clean up any spills in your garage right away. This includes oil or grease spills. WHAT CAN I DO IN THE BATHROOM?   Use night lights.  Install grab bars by the toilet and in the tub and shower. Do not use towel bars as grab bars.  Use non-skid mats or decals in the tub or shower.  If you need to sit down in the shower, use a plastic, non-slip stool.  Keep the floor dry. Clean up any water that spills on the floor as soon as it happens.  Remove soap buildup in the tub or shower regularly.  Attach bath mats securely with double-sided non-slip rug tape.  Do not have throw rugs  and other things on the floor that can make you trip. WHAT CAN I DO IN THE BEDROOM?  Use night lights.  Make sure that you have a light by your bed that is easy to reach.  Do not use any sheets or blankets that are too big for your bed. They should not hang down onto the floor.  Have a firm chair that has side arms. You can use this for support while you get dressed.  Do not have throw rugs and other things on the floor that can make you trip. WHAT CAN I DO IN THE KITCHEN?  Clean up any spills right away.  Avoid walking on wet floors.  Keep items that you use a lot in easy-to-reach places.  If you need to reach something above you, use a strong step stool that has a grab bar.  Keep electrical cords out of the way.  Do not use floor polish or wax that makes floors slippery. If you must use wax, use non-skid floor wax.  Do not have throw rugs and other things on the floor that can make you trip. WHAT CAN I DO WITH MY STAIRS?  Do not leave any items on the stairs.  Make sure that there are handrails on both sides of the stairs and use them. Fix handrails that are broken or loose. Make sure that handrails are as long as the stairways.  Check any carpeting to make sure that it is firmly attached to the stairs. Fix any carpet that is loose or worn.  Avoid having throw rugs at the top or bottom of the stairs. If you do have throw rugs, attach them to the floor with carpet tape.  Make sure that you have a light switch at the top of the stairs and the bottom of the stairs. If you do not have them, ask someone to add them for you. WHAT ELSE CAN I DO TO HELP PREVENT FALLS?  Wear shoes that:  Do not have high heels.  Have rubber bottoms.  Are comfortable and fit you well.  Are closed at  the toe. Do not wear sandals.  If you use a stepladder:  Make sure that it is fully opened. Do not climb a closed stepladder.  Make sure that both sides of the stepladder are locked into  place.  Ask someone to hold it for you, if possible.  Clearly mark and make sure that you can see:  Any grab bars or handrails.  First and last steps.  Where the edge of each step is.  Use tools that help you move around (mobility aids) if they are needed. These include:  Canes.  Walkers.  Scooters.  Crutches.  Turn on the lights when you go into a dark area. Replace any light bulbs as soon as they burn out.  Set up your furniture so you have a clear path. Avoid moving your furniture around.  If any of your floors are uneven, fix them.  If there are any pets around you, be aware of where they are.  Review your medicines with your doctor. Some medicines can make you feel dizzy. This can increase your chance of falling. Ask your doctor what other things that you can do to help prevent falls.   This information is not intended to replace advice given to you by your health care provider. Make sure you discuss any questions you have with your health care provider.   Document Released: 12/06/2008 Document Revised: 06/26/2014 Document Reviewed: 03/16/2014 Elsevier Interactive Patient Education [employer].

## 2015-06-13 NOTE — Telephone Encounter (Signed)
Refill x1  With 1 refill Will need contract and uds

## 2015-06-13 NOTE — Progress Notes (Signed)
Pre visit review using our clinic review tool, if applicable. No additional management support is needed unless otherwise documented below in the visit note. 

## 2015-06-13 NOTE — Telephone Encounter (Signed)
Urine sample provided and control substance contract signed.

## 2015-06-13 NOTE — Telephone Encounter (Signed)
Rx printed along with the contract, the patient is here for her wellness exam with Ashlee.     KP

## 2015-06-13 NOTE — Progress Notes (Addendum)
Subjective:   Mary Flynn is a 81 y.o. female who presents for Medicare Annual (Subsequent) preventive examination.  Review of Systems: No ROS Cardiac Risk Factors include: diabetes mellitus;advanced age (>74mn, >>13women);hypertension   Sleep patterns:  "Disturbed sleep all the time."  Home Safety/Smoke Alarms:  Feels safe at home/Lives with husband in one level home/Smoke alarms present/Night lights present.   Firearm Safety: No firearms Seat Belt Safety/Bike Helmet:  Always wears seat belt. Sun exposure:  Limited sun exposure   Counseling:   Eye Exam- 6 months ago.   Dental-Wears dentures Female:  Mammo- 05/23/15    Dexa scan-05/23/13-osteopenia    CCS-02/01/08-no polyps-Dr. PHenrene Pastor repeat in 10 years.     Objective:     Vitals: BP 130/70 mmHg  Pulse 77  Ht _0  (1.499 m)  Wt 129 lb (58.514 kg)  BMI 26.04 kg/m2  SpO2 97%  Body mass index is 26.04 kg/(m^2).   Tobacco History  Smoking status  . Never Smoker   Smokeless tobacco  . Never Used     Counseling given: No   Past Medical History  Diagnosis Date  . Diabetes mellitus   . Hyperlipidemia   . Hypertension   . Left rib fracture 02/18/2014   Past Surgical History  Procedure Laterality Date  . Appendectomy    . Surgical sterilization  40 years ago  . Refractive surgery      Left Eye   Family History  Problem Relation Age of Onset  . Depression    . Cancer      laryngeal  . Diabetes    . Diabetes Mother    History  Sexual Activity  . Sexual Activity: Not Currently    Outpatient Encounter Prescriptions as of 06/13/2015  Medication Sig  . amLODipine (NORVASC) 5 MG tablet Take 1 tablet (5 mg total) by mouth daily.  .Marland Kitchenaspirin EC 81 MG tablet Take 81 mg by mouth daily.  .Marland Kitchenatorvastatin (LIPITOR) 10 MG tablet Take 1 tablet by mouth  every day  . azelastine (ASTELIN) 0.1 % nasal spray Place 2 sprays into both nostrils 2 (two) times daily. Use in each nostril as directed  . b complex vitamins tablet  Take 1 tablet by mouth daily.  . baclofen (LIORESAL) 20 MG tablet Take 1 tablet by mouth 3  times daily  . Blood Glucose Monitoring Suppl (ONE TOUCH ULTRA 2) W/DEVICE KIT Use to test blood sugar 2 times daily as instructed. Dx code: E11.49  . canagliflozin (INVOKANA) 100 MG TABS tablet Take 1 tablet (100 mg total) by mouth daily before breakfast.  . doxycycline (VIBRA-TABS) 100 MG tablet Take 1 tablet (100 mg total) by mouth 2 (two) times daily.  . fluticasone (FLONASE) 50 MCG/ACT nasal spray Place 2 sprays into both nostrils daily.  .Marland Kitchengabapentin (NEURONTIN) 300 MG capsule Take 1 capsule by mouth 3  times daily  . glimepiride (AMARYL) 4 MG tablet Take 1 tablet by mouth two  times daily  . glucose blood (ONE TOUCH ULTRA TEST) test strip Use to test blood sugar 2 times daily as instructed. Dx code: E11.49  . HYDROcodone-acetaminophen (NORCO/VICODIN) 5-325 MG tablet Take 1 tablet by mouth every 8 (eight) hours as needed for moderate pain.  .Marland KitchenHYDROcodone-homatropine (HYCODAN) 5-1.5 MG/5ML syrup Take 5 mLs by mouth every 8 (eight) hours as needed for cough.  . Insulin Detemir (LEVEMIR FLEXPEN) 100 UNIT/ML Pen Inject 14 Units into the skin at bedtime. (Patient taking differently: Inject 16 Units into  the skin at bedtime. )  . insulin lispro (HUMALOG KWIKPEN) 100 UNIT/ML KiwkPen Inject 4 units at breakfast, 6 units at lunch and 4 units at dinner.  . Insulin Pen Needle 32G X 4 MM MISC Use 3 per day  . levocetirizine (XYZAL) 5 MG tablet Take 1 tablet (5 mg total) by mouth every evening.  . meloxicam (MOBIC) 15 MG tablet Take 1 tablet (15 mg total) by mouth daily.  . metFORMIN (GLUCOPHAGE) 1000 MG tablet Take 1 tablet by mouth  twice a day with meals  . metoprolol succinate (TOPROL-XL) 100 MG 24 hr tablet Take 1 tablet (100 mg total) by mouth daily. Take with or immediately following a meal.  . Niacin CR 1000 MG TBCR 1 po bid (Patient taking differently: 1 po daily)  . ONE TOUCH ULTRA TEST test strip Test  two times daily  . oxybutynin (DITROPAN) 5 MG tablet Take 1 tablet (5 mg total) by mouth 2 (two) times daily.  . pantoprazole (PROTONIX) 40 MG tablet Take 1 tablet by mouth  daily  . polyethylene glycol powder (GLYCOLAX/MIRALAX) powder Using measuring cap mix  17gm in water and drink  daily  . valsartan (DIOVAN) 320 MG tablet Take 1 tablet by mouth  daily  . [DISCONTINUED] traMADol (ULTRAM) 50 MG tablet Take 1 tablet (50 mg total) by mouth every 6 (six) hours as needed.   No facility-administered encounter medications on file as of 06/13/2015.    Activities of Daily Living In your present state of health, do you have any difficulty performing the following activities: 06/13/2015 02/14/2015  Hearing? N N  Vision? Y N  Difficulty concentrating or making decisions? N N  Walking or climbing stairs? Y Y  Dressing or bathing? N N  Doing errands, shopping? Tempie Donning  Preparing Food and eating ? N -  Using the Toilet? N -  In the past six months, have you accidently leaked urine? N -  Do you have problems with loss of bowel control? N -  Managing your Medications? N -  Managing your Finances? N -  Housekeeping or managing your Housekeeping? N -    Patient Care Team: Ann Held, DO as PCP - General Elayne Snare, MD as Consulting Physician (Endocrinology) Kristeen Miss, MD as Consulting Physician (Neurosurgery)    Assessment:  Deferred to PCP.    Exercise Activities and Dietary recommendations Current Exercise Habits: Home exercise routine, Type of exercise: walking, Time (Minutes): 60, Frequency (Times/Week): 7, Weekly Exercise (Minutes/Week): 420, Intensity: Mild   Diet:  Carb modified diet.  Eats 2-3 meals per day.    Goals    . Blood Pressure < 140/90    . HEMOGLOBIN A1C < 8.0      Fall Risk Fall Risk  06/13/2015 02/14/2015 02/02/2014 11/18/2012  Falls in the past year? Yes No No No  Number falls in past yr: 1 - - -  Injury with Fall? Yes - - -  Risk Factor Category  High  Fall Risk - - -  Risk for fall due to : Impaired balance/gait - - -  Follow up Education provided;Falls prevention discussed - - -   Depression Screen PHQ 2/9 Scores 06/13/2015 04/25/2015 02/14/2015 02/02/2014  PHQ - 2 Score 0 0 0 0     Cognitive Testing MMSE - Mini Mental State Exam 06/13/2015  Orientation to time 5  Orientation to Place 5  Registration 3  Attention/ Calculation 5  Recall 2  Language- name 2 objects 2  Language- repeat 1  Language- follow 3 step command 3  Language- read & follow direction 1  Write a sentence 1  Copy design 1  Total score 29    Immunization History  Administered Date(s) Administered  . Influenza Split 11/24/2010  . Influenza Whole 11/30/2007, 12/05/2008, 12/04/2009  . Influenza, High Dose Seasonal PF 11/17/2013, 11/08/2014  . Influenza,inj,Quad PF,36+ Mos 11/18/2012  . PPD Test 02/16/2011, 04/14/2011  . Pneumococcal Conjugate-13 02/02/2014  . Pneumococcal Polysaccharide-23 12/09/2007  . Td 10/12/2006  . Zoster 06/04/2010   Screening Tests Health Maintenance  Topic Date Due  . HEMOGLOBIN A1C  09/19/2015  . INFLUENZA VACCINE  09/24/2015  . FOOT EXAM  11/08/2015  . OPHTHALMOLOGY EXAM  01/30/2016  . MAMMOGRAM  05/22/2016  . TETANUS/TDAP  10/11/2016  . DEXA SCAN  Completed  . ZOSTAVAX  Completed  . PNA vac Low Risk Adult  Completed      Plan:  Continue to monitor blood sugars as advised by provider.  Continue doing brain stimulating activities (puzzles, reading, sewing, adult coloring books, staying active) to keep memory sharp.   Watch simple carb intake and drink plenty of water.    Continue to walk every day.    Schedule follow up appointment with Dr. Etter Sjogren.    During the course of the visit the patient was educated and counseled about the following appropriate screening and preventive services:   Vaccines to include Pneumoccal, Influenza, Hepatitis B, Td, Zostavax, HCV  Electrocardiogram  Cardiovascular  Disease  Colorectal cancer screening  Bone density screening  Diabetes screening  Glaucoma screening  Mammography/PAP  Nutrition counseling   Patient Instructions (the written plan) was given to the patient.   Rudene Anda, RN  06/13/2015     Reviewed-- - Y lowne-chase

## 2015-06-13 NOTE — Telephone Encounter (Signed)
Unable to reach patient on call back

## 2015-06-14 ENCOUNTER — Other Ambulatory Visit: Payer: Self-pay

## 2015-06-17 ENCOUNTER — Other Ambulatory Visit (INDEPENDENT_AMBULATORY_CARE_PROVIDER_SITE_OTHER): Payer: Medicare Other

## 2015-06-17 ENCOUNTER — Ambulatory Visit: Payer: Self-pay | Admitting: Family Medicine

## 2015-06-17 DIAGNOSIS — E785 Hyperlipidemia, unspecified: Secondary | ICD-10-CM | POA: Diagnosis not present

## 2015-06-17 LAB — LIPID PANEL
CHOL/HDL RATIO: 3
CHOLESTEROL: 119 mg/dL (ref 0–200)
HDL: 47.4 mg/dL (ref >39.00)
LDL CALC: 52 mg/dL (ref 0–99)
NonHDL: 71.68
Triglycerides: 98 mg/dL (ref 0.0–149.0)
VLDL: 19.6 mg/dL (ref 0.0–40.0)

## 2015-06-17 LAB — COMPREHENSIVE METABOLIC PANEL
ALT: 33 U/L (ref 0–35)
AST: 20 U/L (ref 0–37)
Albumin: 4.1 g/dL (ref 3.5–5.2)
Alkaline Phosphatase: 59 U/L (ref 39–117)
BUN: 12 mg/dL (ref 6–23)
CHLORIDE: 101 meq/L (ref 96–112)
CO2: 28 meq/L (ref 19–32)
CREATININE: 0.61 mg/dL (ref 0.40–1.20)
Calcium: 9.7 mg/dL (ref 8.4–10.5)
GFR: 100.64 mL/min (ref >60.00)
Glucose, Bld: 209 mg/dL — ABNORMAL HIGH (ref 70–99)
Potassium: 4.5 mEq/L (ref 3.5–5.1)
SODIUM: 138 meq/L (ref 135–145)
Total Bilirubin: 1.3 mg/dL — ABNORMAL HIGH (ref 0.2–1.2)
Total Protein: 6.8 g/dL (ref 6.0–8.3)

## 2015-06-17 NOTE — Addendum Note (Signed)
Addended by: Harl Bowie on: 06/17/2015 08:25 AM   Modules accepted: Orders

## 2015-06-18 ENCOUNTER — Encounter: Payer: Self-pay | Admitting: Family Medicine

## 2015-06-18 ENCOUNTER — Ambulatory Visit (HOSPITAL_BASED_OUTPATIENT_CLINIC_OR_DEPARTMENT_OTHER)
Admission: RE | Admit: 2015-06-18 | Discharge: 2015-06-18 | Disposition: A | Discharge status: Home / Self Care | Payer: Medicare Other | Source: Ambulatory Visit | Attending: Family Medicine | Admitting: Family Medicine

## 2015-06-18 ENCOUNTER — Ambulatory Visit: Payer: Self-pay

## 2015-06-18 ENCOUNTER — Ambulatory Visit (INDEPENDENT_AMBULATORY_CARE_PROVIDER_SITE_OTHER): Payer: Medicare Other | Admitting: Family Medicine

## 2015-06-18 VITALS — BP 150/72 | HR 88 | Temp 98.0°F | Ht 59.0 in | Wt 132.0 lb

## 2015-06-18 DIAGNOSIS — R05 Cough: Secondary | ICD-10-CM

## 2015-06-18 DIAGNOSIS — R059 Cough, unspecified: Secondary | ICD-10-CM

## 2015-06-18 DIAGNOSIS — J209 Acute bronchitis, unspecified: Secondary | ICD-10-CM | POA: Diagnosis not present

## 2015-06-18 MED ORDER — AZITHROMYCIN 250 MG PO TABS: ORAL_TABLET | ORAL | Status: DC

## 2015-06-18 MED FILL — AZITHROMYCIN 250 MG TABLET: 250 | 5 days supply | Qty: 6 | Fill #0

## 2015-06-18 NOTE — Progress Notes (Signed)
Patient ID: Mary Flynn, female    DOB: 03/14/1934  Age: 81 y.o. MRN: [id number]    Subjective:  Subjective HPI Mary Flynn presents for c/o body pain and shooting pain in L left.  She has had sympotms fr 2-3 months  Review of Systems  Constitutional: Positive for fatigue. Negative for diaphoresis, appetite change and unexpected weight change.  Eyes: Negative for pain, redness and visual disturbance.  Respiratory: Negative for cough, chest tightness, shortness of breath and wheezing.   Cardiovascular: Negative for chest pain, palpitations and leg swelling.  Endocrine: Negative for cold intolerance, heat intolerance, polydipsia, polyphagia and polyuria.  Genitourinary: Negative for dysuria, frequency and difficulty urinating.  Neurological: Positive for dizziness. Negative for light-headedness, numbness and headaches.    History Past Medical History  Diagnosis Date  . Diabetes mellitus   . Hyperlipidemia   . Hypertension   . Left rib fracture 02/18/2014    She has past surgical history that includes Appendectomy; surgical sterilization (40 years ago); and Refractive surgery.   Her family history includes Diabetes in her mother.She reports that she has never smoked. She has never used smokeless tobacco. She reports that she does not drink alcohol or use illicit drugs.  Current Outpatient Prescriptions on File Prior to Visit  Medication Sig Dispense Refill  . amLODipine (NORVASC) 5 MG tablet Take 1 tablet (5 mg total) by mouth daily. 90 tablet 1  . aspirin EC 81 MG tablet Take 81 mg by mouth daily.    Marland Kitchen atorvastatin (LIPITOR) 10 MG tablet Take 1 tablet by mouth  every day 90 tablet 1  . azelastine (ASTELIN) 0.1 % nasal spray Place 2 sprays into both nostrils 2 (two) times daily. Use in each nostril as directed 30 mL 3  . b complex vitamins tablet Take 1 tablet by mouth daily.    . baclofen (LIORESAL) 20 MG tablet Take 1 tablet by mouth 3  times daily 270 tablet 1  . fluticasone  (FLONASE) 50 MCG/ACT nasal spray Place 2 sprays into both nostrils daily. 16 g 1  . gabapentin (NEURONTIN) 300 MG capsule Take 1 capsule by mouth 3  times daily 270 capsule 1  . glimepiride (AMARYL) 4 MG tablet Take 1 tablet by mouth two  times daily 180 tablet 1  . glucose blood (ONE TOUCH ULTRA TEST) test strip Use to test blood sugar 2 times daily as instructed. Dx code: E11.49 200 each 3  . Insulin Detemir (LEVEMIR FLEXPEN) 100 UNIT/ML Pen Inject 14 Units into the skin at bedtime. (Patient taking differently: Inject 16 Units into the skin at bedtime. ) 15 mL 3  . insulin lispro (HUMALOG KWIKPEN) 100 UNIT/ML KiwkPen Inject 4 units at breakfast, 6 units at lunch and 4 units at dinner. 15 mL 1  . Insulin Pen Needle 32G X 4 MM MISC Use 3 per day 100 each 3  . levocetirizine (XYZAL) 5 MG tablet Take 1 tablet (5 mg total) by mouth every evening. 90 tablet 1  . meloxicam (MOBIC) 15 MG tablet Take 1 tablet (15 mg total) by mouth daily. 30 tablet 8  . metFORMIN (GLUCOPHAGE) 1000 MG tablet Take 1 tablet by mouth  twice a day with meals 180 tablet 1  . metoprolol succinate (TOPROL-XL) 100 MG 24 hr tablet Take 1 tablet (100 mg total) by mouth daily. Take with or immediately following a meal. 90 tablet 3  . ONE TOUCH ULTRA TEST test strip Test two times daily 200 each 1  .  oxybutynin (DITROPAN) 5 MG tablet Take 1 tablet (5 mg total) by mouth 2 (two) times daily. 180 tablet 3  . pantoprazole (PROTONIX) 40 MG tablet Take 1 tablet by mouth  daily 90 tablet 1  . polyethylene glycol powder (GLYCOLAX/MIRALAX) powder Using measuring cap mix  17gm in water and drink  daily 1530 g 1  . traMADol (ULTRAM) 50 MG tablet Take 1 tablet (50 mg total) by mouth every 6 (six) hours as needed. 180 tablet 1  . valsartan (DIOVAN) 320 MG tablet Take 1 tablet by mouth  daily 90 tablet 1  . canagliflozin (INVOKANA) 100 MG TABS tablet Take 1 tablet (100 mg total) by mouth daily before breakfast. (Patient not taking: Reported on  06/18/2015) 30 tablet 0   No current facility-administered medications on file prior to visit.     Objective:  Objective Physical Exam  Constitutional: She is oriented to person, place, and time. She appears well-developed and well-nourished.  HENT:  Head: Normocephalic and atraumatic.  Eyes: Conjunctivae and EOM are normal.  Neck: Normal range of motion. Neck supple. No JVD present. Carotid bruit is not present. No thyromegaly present.  Cardiovascular: Normal rate, regular rhythm and normal heart sounds.   No murmur heard. Pulmonary/Chest: Effort normal and breath sounds normal. No respiratory distress. She has no wheezes. She has no rales. She exhibits no tenderness.  Musculoskeletal: She exhibits no edema.  Neurological: She is alert and oriented to person, place, and time.  Psychiatric: She has a normal mood and affect. Her behavior is normal. Judgment and thought content normal.  Nursing note and vitals reviewed.  BP 150/72 mmHg  Pulse 88  Temp(Src) 98 F (36.7 C) (Oral)  Ht 4\' 11"  (1.499 m)  Wt 132 lb (59.875 kg)  BMI 26.65 kg/m2  SpO2 95% Wt Readings from Last 3 Encounters:  06/18/15 132 lb (59.875 kg)  06/13/15 129 lb (58.514 kg)  06/11/15 127 lb 9.6 oz (57.879 kg)     Lab Results  Component Value Date   WBC 7.5 04/30/2015   HGB 12.8 04/30/2015   HCT 38.9 04/30/2015   PLT 244.0 04/30/2015   GLUCOSE 209* 06/17/2015   CHOL 119 06/17/2015   TRIG 98.0 06/17/2015   HDL 47.40 06/17/2015   LDLCALC 52 06/17/2015   ALT 33 06/17/2015   AST 20 06/17/2015   NA 138 06/17/2015   K 4.5 06/17/2015   CL 101 06/17/2015   CREATININE 0.61 06/17/2015   BUN 12 06/17/2015   CO2 28 06/17/2015   TSH 4.43 10/17/2014   HGBA1C 8.3* 03/22/2015   MICROALBUR 7.2* 05/22/2014    Dg Chest 2 View  04/30/2015  CLINICAL DATA:  Cough. EXAM: CHEST  2 VIEW COMPARISON:  CT 04/21/2015.  04/12/2015, 05/03/2013. FINDINGS: Mediastinum and hilar structures normal. Bilateral pleural-parenchymal  thickening again noted consistent with scarring. Heart size normal. Prior lumbar spine vertebroplasty . IMPRESSION: Bilateral pleural-parenchymal scarring.  No acute abnormality. Electronically Signed   By: Marcello Moores  Register   On: 04/30/2015 11:05     Assessment & Plan:  Plan I have discontinued Ms. Clawson's Niacin CR, HYDROcodone-acetaminophen, HYDROcodone-homatropine, and doxycycline. I am also having her start on azithromycin. Additionally, I am having her maintain her aspirin EC, b complex vitamins, glucose blood, Insulin Detemir, Insulin Pen Needle, insulin lispro, metoprolol succinate, polyethylene glycol powder, gabapentin, metFORMIN, glimepiride, atorvastatin, valsartan, meloxicam, oxybutynin, amLODipine, canagliflozin, azelastine, fluticasone, pantoprazole, baclofen, ONE TOUCH ULTRA TEST, levocetirizine, traMADol, and niacin.  Meds ordered this encounter  Medications  . niacin (  NIASPAN) 500 MG CR tablet    Sig: Take 1,000 mg by mouth at bedtime.  Marland Kitchen azithromycin (ZITHROMAX Z-PAK) 250 MG tablet    Sig: As directed    Dispense:  6 each    Refill:  0    Problem List Items Addressed This Visit      Unprioritized   Cough - Primary   Relevant Orders   DG Chest 2 View (Completed)    Other Visit Diagnoses    Acute bronchitis, unspecified organism        Relevant Medications    azithromycin (ZITHROMAX Z-PAK) 250 MG tablet     cont' otc meds  Follow-up: Return if symptoms worsen or fail to improve.  Ann Held, DO

## 2015-06-18 NOTE — Patient Instructions (Signed)

## 2015-06-18 NOTE — Progress Notes (Signed)
Pre visit review using our clinic review tool, if applicable. No additional management support is needed unless otherwise documented below in the visit note. 

## 2015-06-19 ENCOUNTER — Encounter: Payer: Self-pay | Admitting: Physical Therapy

## 2015-06-19 ENCOUNTER — Ambulatory Visit: Payer: Medicare Other | Admitting: Physical Therapy

## 2015-06-19 ENCOUNTER — Other Ambulatory Visit: Payer: Self-pay | Admitting: *Deleted

## 2015-06-19 DIAGNOSIS — M542 Cervicalgia: Secondary | ICD-10-CM | POA: Diagnosis not present

## 2015-06-19 DIAGNOSIS — M545 Low back pain: Secondary | ICD-10-CM | POA: Diagnosis not present

## 2015-06-19 MED ORDER — ONETOUCH ULTRA 2 W/DEVICE KIT: PACK | Status: DC

## 2015-06-19 NOTE — Therapy (Signed)
[facility] [place], [place], [address] Phone: [phone]   Fax:  [phone]  Physical Therapy Treatment  Patient Details  Name: NADINE RYLE MRN: [id number] Date of Birth: [birth year] Referring Provider: Etter Sjogren  Encounter Date: 06/19/2015      PT End of Session - 06/19/15 1653    Visit Number 11   Date for PT Re-Evaluation 07/05/15   PT Start Time 1517   PT Stop Time 1605   PT Time Calculation (min) 48 min   Activity Tolerance Patient tolerated treatment well   Behavior During Therapy [facility] for tasks assessed/performed      Past Medical History  Diagnosis Date  . Diabetes mellitus   . Hyperlipidemia   . Hypertension   . Left rib fracture 02/18/2014    Past Surgical History  Procedure Laterality Date  . Appendectomy    . Surgical sterilization  40 years ago  . Refractive surgery      Left Eye    There were no vitals filed for this visit.      Subjective Assessment - 06/19/15 1652    Subjective Patient reports that she is feeling better now that she has been coming consistently   Currently in Pain? Yes   Pain Score 4    Pain Location Back   Aggravating Factors  standing and activity   Pain Relieving Factors treatment                         [facility] Adult PT Treatment/Exercise - 06/19/15 0001    Ambulation/Gait   Gait Comments walk with patient around the building with cues for posture and to keep speed up   Lumbar Exercises: Machines for Strengthening   Other Lumbar Machine Exercise seated row 10# 2x10, lat pull 10# 2x10   Manual Therapy   Manual Therapy Soft tissue mobilization;Passive ROM   Soft tissue mobilization STM of the calves, thighs, back and neck   Passive ROM PROM of the LE's and trunk                  PT Short Term Goals - 04/24/15 1538    PT SHORT TERM GOAL #1   Title independent with a stretching routine   Status Achieved            PT Long Term Goals - 06/19/15 1655    PT LONG TERM GOAL #1   Title decrease pain 50%   Status On-going   PT LONG TERM GOAL #2   Title increase lumbar ROM 25%   Status Achieved   PT LONG TERM GOAL #4   Title be able to clean her house without increase of pain > 4/10   Status Partially Met               Plan - 06/19/15 1654    Clinical Impression Statement Able to tolerate some exercises today, with less c/o pain overall.  She does have knots and tenderness in the back and in the legs.   PT Next Visit Plan see if walking was okay and increase if tolerated   Consulted and Agree with Plan of Care Patient      Patient will benefit from skilled therapeutic intervention in order to improve the following deficits and impairments:  Decreased activity tolerance, Decreased mobility, Decreased range of motion, Decreased strength, Difficulty walking, Increased muscle spasms, Impaired flexibility, Postural dysfunction, Pain  Visit Diagnosis:  Cervicalgia     Problem List Patient Active Problem List   Diagnosis Date Noted  . Pre-ulcerative corn or callous 03/13/2015  . HTN (hypertension) 01/11/2015  . DM type 2 with diabetic peripheral neuropathy ([place]) 10/16/2014  . Left rib fracture 02/18/2014  . Skin infection 09/28/2013  . Otitis, externa, infective 09/28/2013  . Cough 01/11/2013  . Tinea cruris 07/27/2012  . Urinary frequency 07/27/2012  . Eczema 03/08/2012  . Headache(784.0) 03/08/2012  . Arm pain, right 08/24/2011  . Depression 07/16/2011  . Compression fracture of L2 lumbar vertebra ([place]) 06/08/2011  . Chronic insomnia 04/26/2011  . PPD positive 04/17/2011  . URINARY INCONTINENCE, STRESS, FEMALE 04/29/2010  . MEMORY LOSS 12/23/2009  . Chronic bronchitis NEC 12/23/2009  . DIABETIC PERIPHERAL NEUROPATHY 06/28/2009  . Acute low back pain due to trauma 12/20/2008  . FIBROIDS, UTERUS 12/18/2008  . ABDOMINAL BLOATING 12/17/2008  . Constipation 01/24/2008  . GERD  01/23/2008  . SPRAIN&STRAIN OTHER SPECIFIED SITES KNEE&LEG 07/20/2007  . UTI 05/16/2007  . LACERATION OF FINGER 10/12/2006  . THYROID NODULE, RIGHT 08/05/2006  . DM (diabetes mellitus) type II uncontrolled, periph vascular disorder ([place]) 08/05/2006  . HYPERLIPIDEMIA 08/05/2006  . Essential hypertension 08/05/2006  . OSTEOPOROSIS NOS 08/05/2006    Sumner Boast., PT 06/19/2015, 4:56 PM  [facility] [address] [place], [place], [address] Phone: [phone]   Fax:  [phone]  Name: JOPLIN CANTY MRN: [id number] Date of Birth: 06/25/1934

## 2015-06-20 ENCOUNTER — Other Ambulatory Visit: Payer: Self-pay | Admitting: *Deleted

## 2015-06-20 NOTE — Telephone Encounter (Signed)
Yesterday she told me to send it to [facility], which I did, today she wants it sent somewhere else. I sent it to [facility] She is suppose to take 2 500 mg tablets of Niacin

## 2015-06-20 NOTE — Telephone Encounter (Signed)
Patient stated that she need her meter sent to  [place], [place].  [phone] (Phone) [phone] (Fax)       instead of [place] And also want to know if she is supposed to take 500 or 1000 of medication Niacin Patient ask for a call back

## 2015-06-24 ENCOUNTER — Encounter: Payer: Self-pay | Admitting: Physical Therapy

## 2015-06-24 ENCOUNTER — Ambulatory Visit: Payer: Medicare Other | Attending: Medical | Admitting: Physical Therapy

## 2015-06-24 DIAGNOSIS — M545 Low back pain, unspecified: Secondary | ICD-10-CM

## 2015-06-24 DIAGNOSIS — M542 Cervicalgia: Secondary | ICD-10-CM | POA: Diagnosis not present

## 2015-06-24 NOTE — Therapy (Signed)
[facility] [place], [place], [address] Phone: [phone]   Fax:  [phone]  Physical Therapy Treatment  Patient Details  Name: Mary Flynn MRN: [id number] Date of Birth: [birth year] Referring Provider: Etter Sjogren  Encounter Date: 06/24/2015      PT End of Session - 06/24/15 1545    Visit Number 12   Date for PT Re-Evaluation 07/05/15   PT Start Time 1359   PT Stop Time 1449   PT Time Calculation (min) 50 min   Activity Tolerance Patient tolerated treatment well   Behavior During Therapy [facility] for tasks assessed/performed      Past Medical History  Diagnosis Date  . Diabetes mellitus   . Hyperlipidemia   . Hypertension   . Left rib fracture 02/18/2014    Past Surgical History  Procedure Laterality Date  . Appendectomy    . Surgical sterilization  40 years ago  . Refractive surgery      Left Eye    There were no vitals filed for this visit.      Subjective Assessment - 06/24/15 1539    Subjective Patient has started walking for exercise at home some, reports that the "stretching feels good"   Currently in Pain? Yes   Pain Score 3    Pain Location Back   Aggravating Factors  standing, bending   Pain Relieving Factors treatment                         [facility] Adult PT Treatment/Exercise - 06/24/15 0001    Ambulation/Gait   Gait Comments walk with patient around the building with cues for posture and to keep speed up   Lumbar Exercises: Machines for Strengthening   Other Lumbar Machine Exercise seated row 10# 2x10, lat pull 10# 2x10   Manual Therapy   Manual Therapy Soft tissue mobilization;Passive ROM   Soft tissue mobilization STM of the calves, thighs, back and neck   Passive ROM PROM of the LE's and trunk                  PT Short Term Goals - 04/24/15 1538    PT SHORT TERM GOAL #1   Title independent with a stretching routine   Status Achieved           PT Long Term Goals - 06/24/15 1546    PT LONG TERM GOAL #4   Title be able to clean her house without increase of pain > 4/10   Status Partially Met             Patient will benefit from skilled therapeutic intervention in order to improve the following deficits and impairments:     Visit Diagnosis: Cervicalgia  Bilateral low back pain without sciatica     Problem List Patient Active Problem List   Diagnosis Date Noted  . Pre-ulcerative corn or callous 03/13/2015  . HTN (hypertension) 01/11/2015  . DM type 2 with diabetic peripheral neuropathy ([place]) 10/16/2014  . Left rib fracture 02/18/2014  . Skin infection 09/28/2013  . Otitis, externa, infective 09/28/2013  . Cough 01/11/2013  . Tinea cruris 07/27/2012  . Urinary frequency 07/27/2012  . Eczema 03/08/2012  . Headache(784.0) 03/08/2012  . Arm pain, right 08/24/2011  . Depression 07/16/2011  . Compression fracture of L2 lumbar vertebra ([place]) 06/08/2011  . Chronic insomnia 04/26/2011  . PPD positive 04/17/2011  . URINARY INCONTINENCE, STRESS, FEMALE 04/29/2010  .  MEMORY LOSS 12/23/2009  . Chronic bronchitis NEC 12/23/2009  . DIABETIC PERIPHERAL NEUROPATHY 06/28/2009  . Acute low back pain due to trauma 12/20/2008  . FIBROIDS, UTERUS 12/18/2008  . ABDOMINAL BLOATING 12/17/2008  . Constipation 01/24/2008  . GERD 01/23/2008  . SPRAIN&STRAIN OTHER SPECIFIED SITES KNEE&LEG 07/20/2007  . UTI 05/16/2007  . LACERATION OF FINGER 10/12/2006  . THYROID NODULE, RIGHT 08/05/2006  . DM (diabetes mellitus) type II uncontrolled, periph vascular disorder ([place]) 08/05/2006  . HYPERLIPIDEMIA 08/05/2006  . Essential hypertension 08/05/2006  . OSTEOPOROSIS NOS 08/05/2006    Sumner Boast., PT 06/24/2015, 3:47 PM  [place] [address] [place], [place], [address] Phone: [phone]   Fax:  [phone]  Name: MADDYX VALLIE MRN: [id number] Date of  Birth: 10/22/1934

## 2015-06-25 ENCOUNTER — Ambulatory Visit: Payer: Self-pay | Admitting: Endocrinology

## 2015-06-25 ENCOUNTER — Ambulatory Visit: Payer: Self-pay

## 2015-06-25 MED FILL — INVOKANA 100 MG TABLET: 100 | 30 days supply | Qty: 30 | Fill #0

## 2015-06-26 ENCOUNTER — Ambulatory Visit: Payer: Medicare Other | Admitting: Physical Therapy

## 2015-06-27 ENCOUNTER — Telehealth: Payer: Self-pay | Admitting: Family Medicine

## 2015-06-27 NOTE — Telephone Encounter (Signed)
Caller name:Galilea  Relation to pt: self Call back number: [phone] Pharmacy:  Reason for call: Pt came in office stating that her prescription is going to be sent from her pharmacist to have it authorized again since pt still has not received by mail her prescription.Pt states needs rx authorized by PCP ASAP. Pt is leaving town next wk (Wednesday 07-03-15) and is needing rx., pt also mentioned still with dizziness and would like to know what can she do (would like to be called by CMA). Please advise ASAP.

## 2015-06-27 NOTE — Telephone Encounter (Signed)
Caller name: Andrean  Relation to pt: [facility]  Call back number: [phone] reference # [id number]    Reason for call:  Requesting a verbal order for traMADol (ULTRAM) 50 MG tablet due to the concern below. As per [facility] by receiving a verbal well expedite request. Please advise

## 2015-06-28 ENCOUNTER — Telehealth: Payer: Self-pay | Admitting: Family Medicine

## 2015-06-28 ENCOUNTER — Other Ambulatory Visit: Payer: Self-pay

## 2015-06-28 ENCOUNTER — Other Ambulatory Visit: Payer: Self-pay | Admitting: Family Medicine

## 2015-06-28 MED ORDER — MECLIZINE HCL 32 MG PO TABS: 32.0000 mg | ORAL_TABLET | Freq: Four times a day (QID) | ORAL | Status: DC | PRN

## 2015-06-28 MED ORDER — LORATADINE 10 MG PO TABS
10.0000 mg | ORAL_TABLET | Freq: Every day | ORAL | Status: DC | PRN
Start: 2015-06-28 — End: 2015-11-15

## 2015-06-28 MED ORDER — FLUTICASONE PROPIONATE 50 MCG/ACT NA SUSP: 2.0000 | Freq: Every day | NASAL | Status: DC

## 2015-06-28 MED ORDER — MECLIZINE HCL 25 MG PO TABS: 25.0000 mg | ORAL_TABLET | Freq: Four times a day (QID) | ORAL | Status: DC | PRN

## 2015-06-28 MED FILL — MECLIZINE 25 MG TABLET: 25 | 8 days supply | Qty: 30 | Fill #0

## 2015-06-28 NOTE — Telephone Encounter (Signed)
Please Triage this patient.     KP 

## 2015-06-28 NOTE — Telephone Encounter (Signed)
Verbal given to [facility] to re-send the Rx.    KP

## 2015-06-28 NOTE — Telephone Encounter (Signed)
Called patient states for the past 2 weeks she has been experiencing dizziness. States when she bends over and raises up her head swims. Says anything she goes to do she has dizziness. States she is not having any nausea,shortness of breath or pain only the dizziness. Patient states she has to leave for to [place] on next Wednesday and she would like to have it cleared up by then if possible Offered to schedule appointment..Patient states she doesn't feel she needs an appointment because she has already spoken to Dr. Etter Sjogren about this. Please advise.

## 2015-06-28 NOTE — Telephone Encounter (Signed)
meclinzine 25 mg qid prn #30   flonase 2 sprays each nostril qd  claritin 5 mg 1 po qd prn  If no improvement consider neuro vs ent

## 2015-06-28 NOTE — Telephone Encounter (Signed)
Caller name: Mary Flynn Relationship to patient: self Can be reached: [phone]  Reason for call: pt states that she is still have dizziness the same as she was 06/18/15. Pt asking what recommendations are?

## 2015-06-28 NOTE — Telephone Encounter (Signed)
Medications sent to [facility] per patient request. Patient notified.

## 2015-07-01 ENCOUNTER — Encounter: Payer: Self-pay | Admitting: Physical Therapy

## 2015-07-01 ENCOUNTER — Ambulatory Visit: Payer: Medicare Other | Admitting: Physical Therapy

## 2015-07-01 DIAGNOSIS — M542 Cervicalgia: Secondary | ICD-10-CM

## 2015-07-01 DIAGNOSIS — M545 Low back pain, unspecified: Secondary | ICD-10-CM

## 2015-07-01 NOTE — Therapy (Signed)
[facility] [address] [place], [place], [address] Phone: [phone]   Fax:  [phone]  Physical Therapy Treatment  Patient Details  Name: Mary Flynn MRN: [id number] Date of Birth: 04/11/1934 Referring Provider: Lowne  Encounter Date: 07/01/2015      PT End of Session - 07/01/15 1614    Visit Number 13   Date for PT Re-Evaluation 07/05/15   PT Start Time 1403   PT Stop Time 1449   PT Time Calculation (min) 46 min   Activity Tolerance Patient tolerated treatment well   Behavior During Therapy [facility] for tasks assessed/performed      Past Medical History  Diagnosis Date  . Diabetes mellitus   . Hyperlipidemia   . Hypertension   . Left rib fracture 02/18/2014    Past Surgical History  Procedure Laterality Date  . Appendectomy    . Surgical sterilization  40 years ago  . Refractive surgery      Left Eye    There were no vitals filed for this visit.      Subjective Assessment - 07/01/15 1612    Subjective Patient will be heading out of town after today, she reports that the treatments have really helped her but also reports that the stress of traveling to [place] for 2 months has increased some pain and tightness   Currently in Pain? Yes   Pain Score 4    Pain Location Back                         [facility] Adult PT Treatment/Exercise - 07/01/15 0001    Manual Therapy   Manual Therapy Soft tissue mobilization;Passive ROM   Soft tissue mobilization STM of the calves, thighs, back and neck   Passive ROM PROM of the LE's and trunk                  PT Short Term Goals - 04/24/15 1538    PT SHORT TERM GOAL #1   Title independent with a stretching routine   Status Achieved           PT Long Term Goals - 07/01/15 1618    PT LONG TERM GOAL #1   Title decrease pain 50%   Status Partially Met   PT LONG TERM GOAL #2   Title increase lumbar ROM 25%   Status Achieved   PT  LONG TERM GOAL #3   Title increase cervical ROM 25%   Status Achieved   PT LONG TERM GOAL #4   Title be able to clean her house without increase of pain > 4/10   Status Partially Met               Plan - 07/01/15 1614    Clinical Impression Statement Better flexibility, some knots and spasms in the ITB, upper traps and in the T/L area, she was tender today   PT Next Visit Plan Will D/C as she is leaving town   Consulted and Agree with Plan of Care Patient      Patient will benefit from skilled therapeutic intervention in order to improve the following deficits and impairments:  Decreased activity tolerance, Decreased mobility, Decreased range of motion, Decreased strength, Difficulty walking, Increased muscle spasms, Impaired flexibility, Postural dysfunction, Pain  Visit Diagnosis: Cervicalgia  Bilateral low back pain without sciatica       G-Codes - 07/01/15 1618    Functional   Limitation Other PT primary   Other PT Primary Current Status (H8850) At least 40 percent but less than 60 percent impaired, limited or restricted   Other PT Primary Goal Status (Y7741) At least 40 percent but less than 60 percent impaired, limited or restricted   Other PT Primary Discharge Status [phone]) At least 40 percent but less than 60 percent impaired, limited or restricted      Problem List Patient Active Problem List   Diagnosis Date Noted  . Pre-ulcerative corn or callous 03/13/2015  . HTN (hypertension) 01/11/2015  . DM type 2 with diabetic peripheral neuropathy ([place]) 10/16/2014  . Left rib fracture 02/18/2014  . Skin infection 09/28/2013  . Otitis, externa, infective 09/28/2013  . Cough 01/11/2013  . Tinea cruris 07/27/2012  . Urinary frequency 07/27/2012  . Eczema 03/08/2012  . Headache(784.0) 03/08/2012  . Arm pain, right 08/24/2011  . Depression 07/16/2011  . Compression fracture of L2 lumbar vertebra ([place]) 06/08/2011  . Chronic insomnia 04/26/2011  . PPD positive  04/17/2011  . URINARY INCONTINENCE, STRESS, FEMALE 04/29/2010  . MEMORY LOSS 12/23/2009  . Chronic bronchitis NEC 12/23/2009  . DIABETIC PERIPHERAL NEUROPATHY 06/28/2009  . Acute low back pain due to trauma 12/20/2008  . FIBROIDS, UTERUS 12/18/2008  . ABDOMINAL BLOATING 12/17/2008  . Constipation 01/24/2008  . GERD 01/23/2008  . SPRAIN&STRAIN OTHER SPECIFIED SITES KNEE&LEG 07/20/2007  . UTI 05/16/2007  . LACERATION OF FINGER 10/12/2006  . THYROID NODULE, RIGHT 08/05/2006  . DM (diabetes mellitus) type II uncontrolled, periph vascular disorder ([place]) 08/05/2006  . HYPERLIPIDEMIA 08/05/2006  . Essential hypertension 08/05/2006  . OSTEOPOROSIS NOS 08/05/2006    Sumner Boast., PT 07/01/2015, 4:19 PM  [place] [address] [place], [place], [address] Phone: [phone]   Fax:  [phone]  Name: Mary Flynn MRN: [id number] Date of Birth: 1934/08/15

## 2015-07-10 ENCOUNTER — Telehealth: Payer: Self-pay | Admitting: Endocrinology

## 2015-07-10 NOTE — Telephone Encounter (Signed)
PT calling and said that she has questions about how much insulin she needs to be taking. Requests call back at your earliest convenience.

## 2015-07-10 NOTE — Telephone Encounter (Signed)
Patient has gotten her invokana and wanted to know how much she needed to decrease her insulin by, per Dr. Ronnie Derby last OV note I gave her the instructions.

## 2015-07-11 ENCOUNTER — Telehealth: Payer: Self-pay | Admitting: Family Medicine

## 2015-07-11 NOTE — Telephone Encounter (Signed)
She should only need them temporarily-- if she is still dizzy she may need neuro referral

## 2015-07-11 NOTE — Telephone Encounter (Signed)
Please advise if okay for pt to continue taking meclizine.  Sig: Take 1 tablet (25 mg total) by mouth 4 (four) times daily as needed for dizziness., Starting 06/28/2015.  #30, 0 RF.

## 2015-07-11 NOTE — Telephone Encounter (Signed)
Pt would like to know the name of the medication that she takes for dizziness. ALSO pt says that PCP only called in a 30 day supply. She would like to know if she would like for her to continue taking them ?   Pt says that she is currently out of the country so if she could be advised and she will have her provider there fill.    CB: [phone]

## 2015-07-12 NOTE — Telephone Encounter (Signed)
Spoke w/ Mary Flynn, most of her concerns are not related to meclizine, however she did want to know the name of the medication, which was provided. She is still experiencing dizziness, and I advised her that Dr. Carollee Herter would consider referring her to neurology given continued symptoms. She said "ok" but wanted to talk about below.  She is in [place] staying with her daughter. She has scheduled dental surgery there. The surgery was supposed to be today, however they did not have all of the necessary pre-procedure information completed, so surgery has been pushed out to next week, exact date TBD. The Mary Flynn needed to know the following information from PCP:   1) How long should she hold ASA prior to procedure and when should she resume?  2) Blood pressure control. Mary Flynn given most recent BP value from our records (150/72 on 06/18/15) 3) Diabetes control. Mary Flynn given most recent Hemoglobin A1c value (8.3 on 03/22/15) 4) Mary Flynn states she has been having painless bilateral LEE since traveling to [place]. States her dental surgeon needed to know if was okay per Dr. Carollee Herter to proceed with surgery given her edema. Advised Mary Flynn that she would need to see a local provider to evaluate her swelling, as this is a new symptom and Dr. Carollee Herter cannot evaluate remotely. Mary Flynn expressed concerns about seeing local provider because they will not have her medical history. Advised Mary Flynn that we could send medical records to a local provider if she let us know where to send them.  5) "You must ask Dr. Etter Sjogren if there are any other precautionary measures I need to take for surgery."  Also offered to send lab work to Mary Flynn's Buyer, [employer], but she declined, and reported all she needed at the time were her BP reading and last A1c. She does not know the name of her Buyer, [employer]. Please advise. I advised Mary Flynn that given her multiple concerns and it being afternoon on Friday, she should not expect to hear back from Dr. Carollee Herter until Monday at the  earliest, at which time the call was disconnected. Attempted to call back, and LMOM to return call if any further questions.

## 2015-07-12 NOTE — Telephone Encounter (Signed)
LMOM to return call.

## 2015-07-12 NOTE — Telephone Encounter (Signed)
Stop asa 5-7 days before-- can restart day after bp good

## 2015-07-12 NOTE — Telephone Encounter (Signed)
She sees endo for her DM They would need to check her bp there to see how it is now

## 2015-07-12 NOTE — Telephone Encounter (Signed)
She checked her BP today and it was 149/70. Any advice re ASA or other precautions she needs to take?

## 2015-07-15 NOTE — Telephone Encounter (Signed)
Pt called and notified of below. She verbalized understanding and stated, "that's all I need for now." Stated she wasn't sure if she was going to have surgery or not.

## 2015-07-25 ENCOUNTER — Other Ambulatory Visit: Payer: Self-pay | Admitting: Family Medicine

## 2015-07-25 NOTE — Telephone Encounter (Signed)
Duplicate request.    KP 

## 2015-07-29 ENCOUNTER — Encounter: Payer: Self-pay | Admitting: Family Medicine

## 2015-08-01 ENCOUNTER — Other Ambulatory Visit: Payer: Self-pay | Admitting: Family Medicine

## 2015-08-03 ENCOUNTER — Other Ambulatory Visit: Payer: Self-pay | Admitting: Family Medicine

## 2015-08-21 ENCOUNTER — Encounter: Payer: Self-pay | Admitting: Family Medicine

## 2015-09-24 ENCOUNTER — Telehealth: Payer: Self-pay

## 2015-09-24 ENCOUNTER — Other Ambulatory Visit: Payer: Self-pay | Admitting: Family Medicine

## 2015-09-24 DIAGNOSIS — I10 Essential (primary) hypertension: Secondary | ICD-10-CM

## 2015-09-24 DIAGNOSIS — E785 Hyperlipidemia, unspecified: Secondary | ICD-10-CM

## 2015-09-24 DIAGNOSIS — E1151 Type 2 diabetes mellitus with diabetic peripheral angiopathy without gangrene: Secondary | ICD-10-CM

## 2015-09-24 DIAGNOSIS — E041 Nontoxic single thyroid nodule: Secondary | ICD-10-CM

## 2015-09-24 NOTE — Telephone Encounter (Signed)
Order in.

## 2015-09-24 NOTE — Telephone Encounter (Signed)
Please advise      KP 

## 2015-09-24 NOTE — Telephone Encounter (Signed)
Patient is asking to get her labs before her CPE can orders be places

## 2015-09-25 NOTE — Telephone Encounter (Signed)
Called patient to schedule her for labs for her CPE no answer

## 2015-10-04 ENCOUNTER — Encounter: Payer: Self-pay | Admitting: Family Medicine

## 2015-10-07 ENCOUNTER — Other Ambulatory Visit: Payer: Self-pay | Admitting: Family Medicine

## 2015-10-18 ENCOUNTER — Encounter: Payer: Self-pay | Admitting: Family Medicine

## 2015-10-18 ENCOUNTER — Telehealth: Payer: Self-pay | Admitting: Family Medicine

## 2015-10-18 NOTE — Telephone Encounter (Signed)
Mailed letter to patient to call the office and reschedule December 03, 2015 appointment with Dr. Carollee Herter @ 3:30 PM.

## 2015-10-25 ENCOUNTER — Other Ambulatory Visit: Payer: Self-pay | Admitting: Endocrinology

## 2015-10-25 ENCOUNTER — Other Ambulatory Visit: Payer: Self-pay | Admitting: Medical

## 2015-10-25 ENCOUNTER — Other Ambulatory Visit: Payer: Self-pay | Admitting: Family Medicine

## 2015-10-29 ENCOUNTER — Telehealth: Payer: Self-pay | Admitting: Family Medicine

## 2015-10-29 DIAGNOSIS — N390 Urinary tract infection, site not specified: Secondary | ICD-10-CM

## 2015-10-29 NOTE — Telephone Encounter (Signed)
Ok to check ua and c and s if needed

## 2015-10-29 NOTE — Telephone Encounter (Signed)
The order is in, please schedule.     KP

## 2015-10-29 NOTE — Telephone Encounter (Signed)
Caller name: Sheenah  Relation to pt: self  Call back number: Pharmacy:  Reason for call: Pt came in office stating is feeling irritation in her Urine since she was in [place] and does not know if change of anything made her have urine changing color. Pt would like to know if she can get Urine test done at lab ( would like to have lab order if possible). Please advise.

## 2015-10-29 NOTE — Telephone Encounter (Signed)
Pt left the office but ok to call pt at  [phone] to let her know if orders is put in so she can have labs done.

## 2015-10-29 NOTE — Telephone Encounter (Signed)
Please advise if it is ok to check her urine.     Mary Flynn

## 2015-10-30 ENCOUNTER — Other Ambulatory Visit (INDEPENDENT_AMBULATORY_CARE_PROVIDER_SITE_OTHER): Payer: Medicare Other

## 2015-10-30 DIAGNOSIS — N390 Urinary tract infection, site not specified: Secondary | ICD-10-CM

## 2015-10-30 DIAGNOSIS — I10 Essential (primary) hypertension: Secondary | ICD-10-CM

## 2015-10-30 DIAGNOSIS — E1151 Type 2 diabetes mellitus with diabetic peripheral angiopathy without gangrene: Secondary | ICD-10-CM

## 2015-10-30 DIAGNOSIS — E785 Hyperlipidemia, unspecified: Secondary | ICD-10-CM

## 2015-10-30 LAB — POC URINALSYSI DIPSTICK (AUTOMATED)
BILIRUBIN UA: NEGATIVE
Blood, UA: NEGATIVE
GLUCOSE UA: 1000
KETONES UA: NEGATIVE
Nitrite, UA: NEGATIVE
Protein, UA: NEGATIVE
Spec Grav, UA: 1.015
Urobilinogen, UA: 0.2
pH, UA: 6

## 2015-11-01 ENCOUNTER — Other Ambulatory Visit: Payer: Self-pay | Admitting: Family Medicine

## 2015-11-01 LAB — URINE CULTURE

## 2015-11-04 ENCOUNTER — Other Ambulatory Visit (INDEPENDENT_AMBULATORY_CARE_PROVIDER_SITE_OTHER): Payer: Medicare Other

## 2015-11-04 ENCOUNTER — Telehealth: Payer: Self-pay | Admitting: Family Medicine

## 2015-11-04 DIAGNOSIS — N390 Urinary tract infection, site not specified: Secondary | ICD-10-CM

## 2015-11-04 DIAGNOSIS — E785 Hyperlipidemia, unspecified: Secondary | ICD-10-CM

## 2015-11-04 DIAGNOSIS — I10 Essential (primary) hypertension: Secondary | ICD-10-CM | POA: Diagnosis not present

## 2015-11-04 DIAGNOSIS — E1151 Type 2 diabetes mellitus with diabetic peripheral angiopathy without gangrene: Secondary | ICD-10-CM | POA: Diagnosis not present

## 2015-11-04 LAB — COMPREHENSIVE METABOLIC PANEL
ALT: 20 U/L (ref 0–35)
AST: 20 U/L (ref 0–37)
Albumin: 4.3 g/dL (ref 3.5–5.2)
Alkaline Phosphatase: 49 U/L (ref 39–117)
BILIRUBIN TOTAL: 1.2 mg/dL (ref 0.2–1.2)
BUN: 15 mg/dL (ref 6–23)
CALCIUM: 9.1 mg/dL (ref 8.4–10.5)
CO2: 28 mEq/L (ref 19–32)
CREATININE: 0.76 mg/dL (ref 0.40–1.20)
Chloride: 101 mEq/L (ref 96–112)
GFR: 78.01 mL/min (ref >60.00)
GLUCOSE: 149 mg/dL — AB (ref 70–99)
Potassium: 3.8 mEq/L (ref 3.5–5.1)
Sodium: 137 mEq/L (ref 135–145)
TOTAL PROTEIN: 6.7 g/dL (ref 6.0–8.3)

## 2015-11-04 MED ORDER — VALSARTAN 160 MG PO TABS: 320.0000 mg | ORAL_TABLET | Freq: Every day | ORAL | 3 refills | Status: DC

## 2015-11-04 MED ORDER — CIPROFLOXACIN HCL 250 MG PO TABS
250.0000 mg | ORAL_TABLET | Freq: Two times a day (BID) | ORAL | 0 refills | Status: DC
Start: 2015-11-04 — End: 2015-11-15

## 2015-11-04 MED ORDER — AMLODIPINE BESYLATE 5 MG PO TABS: 5.0000 mg | ORAL_TABLET | Freq: Every day | ORAL | 1 refills | Status: DC

## 2015-11-04 MED FILL — CIPROFLOXACIN HCL 250 MG TA: 250 | 3 days supply | Qty: 6 | Fill #0

## 2015-11-04 NOTE — Telephone Encounter (Signed)
Relation to [id number] Call back [place]: [place] [place] [phone] (Phone) [phone] (Fax)     Reason for call:  Patient states insurance will not cover valsartan (DIOVAN) 320 MG tablet but will cover 120 MG  therefore patient states she will take 2 a day. As per patient requesting to new Rx to mail order. Please advise

## 2015-11-04 NOTE — Telephone Encounter (Signed)
Rx has been faxed and the patient is aware.     KP

## 2015-11-04 NOTE — Telephone Encounter (Signed)
Patient stated the 320 Diovan is no longer covered but the 160 mg is still covered, please advise if it is ok to change and advise on directions.     KP

## 2015-11-04 NOTE — Telephone Encounter (Signed)
Ok to change to 160 mg  2po qd

## 2015-11-15 ENCOUNTER — Other Ambulatory Visit: Payer: Self-pay

## 2015-11-15 ENCOUNTER — Telehealth: Payer: Self-pay | Admitting: Family Medicine

## 2015-11-15 ENCOUNTER — Ambulatory Visit (HOSPITAL_BASED_OUTPATIENT_CLINIC_OR_DEPARTMENT_OTHER)
Admission: RE | Admit: 2015-11-15 | Discharge: 2015-11-15 | Disposition: A | Discharge status: Home / Self Care | Payer: Medicare Other | Source: Ambulatory Visit | Attending: Family Medicine | Admitting: Family Medicine

## 2015-11-15 ENCOUNTER — Encounter: Payer: Self-pay | Admitting: Family Medicine

## 2015-11-15 ENCOUNTER — Ambulatory Visit (INDEPENDENT_AMBULATORY_CARE_PROVIDER_SITE_OTHER): Payer: Medicare Other | Admitting: Family Medicine

## 2015-11-15 VITALS — BP 119/73 | HR 78 | Temp 98.3°F | Ht 59.0 in | Wt 135.0 lb

## 2015-11-15 DIAGNOSIS — R0602 Shortness of breath: Secondary | ICD-10-CM

## 2015-11-15 DIAGNOSIS — K219 Gastro-esophageal reflux disease without esophagitis: Secondary | ICD-10-CM

## 2015-11-15 DIAGNOSIS — M545 Low back pain: Secondary | ICD-10-CM

## 2015-11-15 DIAGNOSIS — R079 Chest pain, unspecified: Secondary | ICD-10-CM

## 2015-11-15 DIAGNOSIS — G4700 Insomnia, unspecified: Secondary | ICD-10-CM

## 2015-11-15 DIAGNOSIS — E785 Hyperlipidemia, unspecified: Secondary | ICD-10-CM | POA: Diagnosis not present

## 2015-11-15 DIAGNOSIS — Z23 Encounter for immunization: Secondary | ICD-10-CM

## 2015-11-15 DIAGNOSIS — I10 Essential (primary) hypertension: Secondary | ICD-10-CM | POA: Diagnosis not present

## 2015-11-15 LAB — LIPID PANEL
CHOL/HDL RATIO: 2
Cholesterol: 98 mg/dL (ref 0–200)
HDL: 50 mg/dL (ref >39.00)
LDL CALC: 23 mg/dL (ref 0–99)
NonHDL: 47.7
TRIGLYCERIDES: 122 mg/dL (ref 0.0–149.0)
VLDL: 24.4 mg/dL (ref 0.0–40.0)

## 2015-11-15 LAB — COMPREHENSIVE METABOLIC PANEL
ALT: 23 U/L (ref 0–35)
AST: 24 U/L (ref 0–37)
Albumin: 4.2 g/dL (ref 3.5–5.2)
Alkaline Phosphatase: 49 U/L (ref 39–117)
BILIRUBIN TOTAL: 1.1 mg/dL (ref 0.2–1.2)
BUN: 13 mg/dL (ref 6–23)
CHLORIDE: 100 meq/L (ref 96–112)
CO2: 28 meq/L (ref 19–32)
Calcium: 9 mg/dL (ref 8.4–10.5)
Creatinine, Ser: 0.77 mg/dL (ref 0.40–1.20)
GFR: 76.84 mL/min (ref >60.00)
GLUCOSE: 127 mg/dL — AB (ref 70–99)
POTASSIUM: 3.5 meq/L (ref 3.5–5.1)
Sodium: 136 mEq/L (ref 135–145)
Total Protein: 6.9 g/dL (ref 6.0–8.3)

## 2015-11-15 LAB — CBC WITH DIFFERENTIAL/PLATELET
BASOS ABS: 0 10*3/uL (ref 0.0–0.1)
Basophils Relative: 0.2 % (ref 0.0–3.0)
Eosinophils Absolute: 0.1 10*3/uL (ref 0.0–0.7)
Eosinophils Relative: 1.7 % (ref 0.0–5.0)
HCT: 36.1 % (ref 36.0–46.0)
Hemoglobin: 11.8 g/dL — ABNORMAL LOW (ref 12.0–15.0)
LYMPHS ABS: 2.3 10*3/uL (ref 0.7–4.0)
Lymphocytes Relative: 33.6 % (ref 12.0–46.0)
MCHC: 32.6 g/dL (ref 30.0–36.0)
MCV: 74.9 fl — AB (ref 78.0–100.0)
MONOS PCT: 10.8 % (ref 3.0–12.0)
Monocytes Absolute: 0.7 10*3/uL (ref 0.1–1.0)
NEUTROS ABS: 3.7 10*3/uL (ref 1.4–7.7)
NEUTROS PCT: 53.7 % (ref 43.0–77.0)
PLATELETS: 291 10*3/uL (ref 150.0–400.0)
RBC: 4.82 Mil/uL (ref 3.87–5.11)
RDW: 17.5 % — AB (ref 11.5–15.5)
WBC: 7 10*3/uL (ref 4.0–10.5)

## 2015-11-15 LAB — POCT URINALYSIS DIPSTICK
BILIRUBIN UA: NEGATIVE
Blood, UA: NEGATIVE
Ketones, UA: NEGATIVE
LEUKOCYTES UA: NEGATIVE
NITRITE UA: NEGATIVE
Protein, UA: NEGATIVE
Spec Grav, UA: 1.02
Urobilinogen, UA: 0.2
pH, UA: 6

## 2015-11-15 MED ORDER — MIRTAZAPINE 15 MG PO TABS: 15.0000 mg | ORAL_TABLET | Freq: Every day | ORAL | 1 refills | Status: DC

## 2015-11-15 MED ORDER — VALSARTAN 160 MG PO TABS: 320.0000 mg | ORAL_TABLET | Freq: Every day | ORAL | 3 refills | Status: DC

## 2015-11-15 MED ORDER — NONFORMULARY OR COMPOUNDED ITEM: 0 refills | Status: DC

## 2015-11-15 NOTE — Telephone Encounter (Signed)
Rx faxed.    KP 

## 2015-11-15 NOTE — Progress Notes (Unsigned)
Pre visit review using our clinic review tool, if applicable. No additional management support is needed unless otherwise documented below in the visit note. 

## 2015-11-15 NOTE — Telephone Encounter (Signed)
April - pt's insurance says that they will only cover the generic valsartan . Provider ok'd change. They state that pcp would need to send a new Rx that has generic Rx.

## 2015-11-15 NOTE — Progress Notes (Signed)
Pre visit review using our clinic review tool, if applicable. No additional management support is needed unless otherwise documented below in the visit note. 

## 2015-11-15 NOTE — Progress Notes (Addendum)
Patient ID: Mary Flynn, female    DOB: 12/09/1934  Age: 81 y.o. MRN: [id number]    Subjective:  Subjective  HPI Mary Flynn presents for shortness of breath with exertion x 2 months.  She states it is getting worse-- now it occurs with any activity.  Chest pain occurs on occasion.  No cough or congestion.     Review of Systems  Constitutional: Negative for appetite change, diaphoresis, fatigue and unexpected weight change.  Eyes: Negative for pain, redness and visual disturbance.  Respiratory: Positive for shortness of breath. Negative for cough, chest tightness and wheezing.   Cardiovascular: Positive for chest pain. Negative for palpitations and leg swelling.  Endocrine: Negative for cold intolerance, heat intolerance, polydipsia, polyphagia and polyuria.  Genitourinary: Negative for difficulty urinating, dysuria and frequency.  Neurological: Negative for dizziness, light-headedness, numbness and headaches.    History Past Medical History:  Diagnosis Date  . Diabetes mellitus   . Hyperlipidemia   . Hypertension   . Left rib fracture 02/18/2014    She has a past surgical history that includes Appendectomy; surgical sterilization (40 years ago); and Refractive surgery.   Her family history includes Diabetes in her mother.She reports that she has never smoked. She has never used smokeless tobacco. She reports that she does not drink alcohol or use drugs.  Current Outpatient Prescriptions on File Prior to Visit  Medication Sig Dispense Refill  . aspirin EC 81 MG tablet Take 81 mg by mouth daily.    Marland Kitchen b complex vitamins tablet Take 1 tablet by mouth daily.    . Blood Glucose Monitoring Suppl (ONE TOUCH ULTRA 2) w/Device KIT Use as directed to test  blood sugar two times daily 1 each 1  . canagliflozin (INVOKANA) 100 MG TABS tablet Take 1 tablet (100 mg total) by mouth daily before breakfast. 30 tablet 0  . fluticasone (FLONASE) 50 MCG/ACT nasal spray Place 2 sprays into both  nostrils daily. 16 g 1  . gabapentin (NEURONTIN) 300 MG capsule Take 1 capsule by mouth 3  times daily 270 capsule 1  . glimepiride (AMARYL) 4 MG tablet Take 1 tablet by mouth  twice a day 180 tablet 0  . glucose blood (ONE TOUCH ULTRA TEST) test strip Use to test blood sugar 2 times daily as instructed. Dx code: E11.49 200 each 3  . Insulin Detemir (LEVEMIR FLEXPEN) 100 UNIT/ML Pen Inject 14 Units into the skin at bedtime. (Patient taking differently: Inject 16 Units into the skin at bedtime. ) 15 mL 3  . insulin lispro (HUMALOG KWIKPEN) 100 UNIT/ML KiwkPen Inject 4 units at breakfast, 6 units at lunch and 4 units at dinner. 15 mL 1  . Insulin Pen Needle 32G X 4 MM MISC Use 3 per day 100 each 3  . meclizine (ANTIVERT) 25 MG tablet Take 1 tablet (25 mg total) by mouth 4 (four) times daily as needed for dizziness. 30 tablet 0  . meloxicam (MOBIC) 15 MG tablet Take 1 tablet (15 mg total) by mouth daily. 30 tablet 8  . metFORMIN (GLUCOPHAGE) 1000 MG tablet Take 1 tablet by mouth  twice a day with meals 180 tablet 1  . niacin (NIASPAN) 500 MG CR tablet Take 1,000 mg by mouth at bedtime.    . ONE TOUCH ULTRA TEST test strip Test two times daily 200 each 1  . traMADol (ULTRAM) 50 MG tablet Take 1 tablet (50 mg total) by mouth every 6 (six) hours as needed. 180 tablet 1  No current facility-administered medications on file prior to visit.      Objective:  Objective  Physical Exam  Constitutional: She is oriented to person, place, and time. She appears well-developed and well-nourished.  HENT:  Head: Normocephalic and atraumatic.  Eyes: Conjunctivae and EOM are normal.  Neck: Normal range of motion. Neck supple. No JVD present. Carotid bruit is not present. No thyromegaly present.  Cardiovascular: Normal rate, regular rhythm and normal heart sounds.   No murmur heard. Pulmonary/Chest: Effort normal and breath sounds normal. No respiratory distress. She has no wheezes. She has no rales. She  exhibits no tenderness.  Musculoskeletal: She exhibits no edema.  Neurological: She is alert and oriented to person, place, and time.  Psychiatric: She has a normal mood and affect. Her behavior is normal. Judgment and thought content normal.  Nursing note and vitals reviewed.  BP 119/73 (BP Location: Right Arm, Patient Position: Sitting, Cuff Size: Small)   Pulse 78   Temp 98.3 F (36.8 C) (Oral)   Ht _0  (1.499 m)   Wt 135 lb (61.2 kg)   SpO2 95%   BMI 27.27 kg/m  Wt Readings from Last 3 Encounters:  11/15/15 135 lb (61.2 kg)  06/18/15 132 lb (59.9 kg)  06/13/15 129 lb (58.5 kg)     Lab Results  Component Value Date   WBC 7.0 11/15/2015   HGB 11.8 (L) 11/15/2015   HCT 36.1 11/15/2015   PLT 291.0 11/15/2015   GLUCOSE 127 (H) 11/15/2015   CHOL 98 11/15/2015   TRIG 122.0 11/15/2015   HDL 50.00 11/15/2015   LDLCALC 23 11/15/2015   ALT 23 11/15/2015   AST 24 11/15/2015   NA 136 11/15/2015   K 3.5 11/15/2015   CL 100 11/15/2015   CREATININE 0.77 11/15/2015   BUN 13 11/15/2015   CO2 28 11/15/2015   TSH 4.43 10/17/2014   HGBA1C 8.3 (H) 03/22/2015   MICROALBUR 7.2 (H) 05/22/2014    Dg Chest 2 View  Result Date: 06/18/2015 CLINICAL DATA:  Patient with cough and congestion for 3 months. EXAM: CHEST  2 VIEW COMPARISON:  Chest radiograph 04/30/2015. FINDINGS: Stable cardiac and mediastinal contours. Unchanged bilateral linear opacities compatible with scarring. No large area of pulmonary consolidation. No pleural effusion or pneumothorax. Kyphoplasty material lumbar spine. IMPRESSION: No acute cardiopulmonary process. Electronically Signed   By: Lovey Newcomer M.D.   On: 06/18/2015 15:59     Assessment & Plan:  Plan  I have discontinued [name]'s mirtazapine. I have also changed her pantoprazole. Additionally, I am having her start on mirtazapine and NONFORMULARY OR COMPOUNDED ITEM. Lastly, I am having her maintain her aspirin EC, b complex vitamins, glucose blood, Insulin  Detemir, Insulin Pen Needle, insulin lispro, meloxicam, canagliflozin, ONE TOUCH ULTRA TEST, traMADol, niacin, fluticasone, meclizine, metFORMIN, glimepiride, ONE TOUCH ULTRA 2, gabapentin, amLODipine, atorvastatin, metoprolol succinate, and valsartan.  Meds ordered this encounter  Medications  . DISCONTD: mirtazapine (REMERON) 15 MG tablet    Sig: Take 15 mg by mouth at bedtime.  . mirtazapine (REMERON) 15 MG tablet    Sig: Take 1 tablet (15 mg total) by mouth at bedtime.    Dispense:  90 tablet    Refill:  1  . NONFORMULARY OR COMPOUNDED ITEM    Sig: Lumbar support belt  #1  As directed    Dispense:  1 each    Refill:  0  . DISCONTD: valsartan (DIOVAN) 160 MG tablet    Sig: Take 2 tablets (  320 mg total) by mouth daily.    Dispense:  180 tablet    Refill:  3    Generic is ok  . pantoprazole (PROTONIX) 40 MG tablet    Sig: Take 1 tablet (40 mg total) by mouth daily.    Dispense:  90 tablet    Refill:  1  . DISCONTD: metoprolol succinate (TOPROL-XL) 100 MG 24 hr tablet    Sig: Take 1 tablet (100 mg total) by mouth daily. Take with or immediately following a meal.    Dispense:  90 tablet    Refill:  3  . DISCONTD: atorvastatin (LIPITOR) 10 MG tablet    Sig: Take 1 tablet (10 mg total) by mouth daily.    Dispense:  90 tablet    Refill:  1  . amLODipine (NORVASC) 5 MG tablet    Sig: Take 1 tablet (5 mg total) by mouth daily.    Dispense:  90 tablet    Refill:  1  . atorvastatin (LIPITOR) 10 MG tablet    Sig: Take 1 tablet (10 mg total) by mouth daily.    Dispense:  90 tablet    Refill:  1  . metoprolol succinate (TOPROL-XL) 100 MG 24 hr tablet    Sig: Take 1 tablet (100 mg total) by mouth daily. Take with or immediately following a meal.    Dispense:  90 tablet    Refill:  3  . valsartan (DIOVAN) 160 MG tablet    Sig: Take 2 tablets (320 mg total) by mouth daily.    Dispense:  180 tablet    Refill:  3    Generic is ok    Problem List Items Addressed This Visit       Unprioritized   GERD   Relevant Medications   pantoprazole (PROTONIX) 40 MG tablet   Essential hypertension   Relevant Medications   amLODipine (NORVASC) 5 MG tablet   atorvastatin (LIPITOR) 10 MG tablet   metoprolol succinate (TOPROL-XL) 100 MG 24 hr tablet   valsartan (DIOVAN) 160 MG tablet   Other Relevant Orders   Lipid panel (Completed)   CBC with Differential/Platelet (Completed)   Comprehensive metabolic panel (Completed)   POCT urinalysis dipstick (Completed)    Other Visit Diagnoses    SOB (shortness of breath) on exertion    -  Primary   Relevant Orders   EKG 12-Lead (Completed)   ECHOCARDIOGRAM COMPLETE   DG Chest 2 View (Completed)   Lipid panel (Completed)   CBC with Differential/Platelet (Completed)   Comprehensive metabolic panel (Completed)   POCT urinalysis dipstick (Completed)   Chest pain, unspecified chest pain type       Relevant Orders   EKG 12-Lead (Completed)   ECHOCARDIOGRAM COMPLETE   DG Chest 2 View (Completed)   Lipid panel (Completed)   CBC with Differential/Platelet (Completed)   Comprehensive metabolic panel (Completed)   POCT urinalysis dipstick (Completed)   Need for prophylactic vaccination and inoculation against influenza       Relevant Orders   Flu vaccine HIGH DOSE PF (Fluzone High dose) (Completed)   Insomnia       Relevant Medications   mirtazapine (REMERON) 15 MG tablet   Hyperlipidemia       Relevant Medications   amLODipine (NORVASC) 5 MG tablet   atorvastatin (LIPITOR) 10 MG tablet   metoprolol succinate (TOPROL-XL) 100 MG 24 hr tablet   valsartan (DIOVAN) 160 MG tablet   Other Relevant Orders   Lipid panel (Completed)  CBC with Differential/Platelet (Completed)   Comprehensive metabolic panel (Completed)   POCT urinalysis dipstick (Completed)   Low back pain without sciatica, unspecified back pain laterality       Relevant Medications   NONFORMULARY OR COMPOUNDED ITEM    check labs and tests as ordered-- if symptoms  worsen go to ER  Follow-up: Return if symptoms worsen or fail to improve, for as scheduled.  Ann Held, DO

## 2015-11-15 NOTE — Patient Instructions (Signed)
Nonspecific Chest Pain  °Chest pain can be caused by many different conditions. There is always a chance that your pain could be related to something serious, such as a heart attack or a blood clot in your lungs. Chest pain can also be caused by conditions that are not life-threatening. If you have chest pain, it is very important to follow up with your health care provider. °CAUSES  °Chest pain can be caused by: °· Heartburn. °· Pneumonia or bronchitis. °· Anxiety or stress. °· Inflammation around your heart (pericarditis) or lung (pleuritis or pleurisy). °· A blood clot in your lung. °· A collapsed lung (pneumothorax). It can develop suddenly on its own (spontaneous pneumothorax) or from trauma to the chest. °· Shingles infection (varicella-zoster virus). °· Heart attack. °· Damage to the bones, muscles, and cartilage that make up your chest wall. This can include: °¨ Bruised bones due to injury. °¨ Strained muscles or cartilage due to frequent or repeated coughing or overwork. °¨ Fracture to one or more ribs. °¨ Sore cartilage due to inflammation (costochondritis). °RISK FACTORS  °Risk factors for chest pain may include: °· Activities that increase your risk for trauma or injury to your chest. °· Respiratory infections or conditions that cause frequent coughing. °· Medical conditions or overeating that can cause heartburn. °· Heart disease or family history of heart disease. °· Conditions or health behaviors that increase your risk of developing a blood clot. °· Having had chicken pox (varicella zoster). °SIGNS AND SYMPTOMS °Chest pain can feel like: °· Burning or tingling on the surface of your chest or deep in your chest. °· Crushing, pressure, aching, or squeezing pain. °· Dull or sharp pain that is worse when you move, cough, or take a deep breath. °· Pain that is also felt in your back, neck, shoulder, or arm, or pain that spreads to any of these areas. °Your chest pain may come and go, or it may stay  constant. °DIAGNOSIS °Lab tests or other studies may be needed to find the cause of your pain. Your health care provider may have you take a test called an ambulatory ECG (electrocardiogram). An ECG records your heartbeat patterns at the time the test is performed. You may also have other tests, such as: °· Transthoracic echocardiogram (TTE). During echocardiography, sound waves are used to create a picture of all of the heart structures and to look at how blood flows through your heart. °· Transesophageal echocardiogram (TEE). This is a more advanced imaging test that obtains images from inside your body. It allows your health care provider to see your heart in finer detail. °· Cardiac monitoring. This allows your health care provider to monitor your heart rate and rhythm in real time. °· Holter monitor. This is a portable device that records your heartbeat and can help to diagnose abnormal heartbeats. It allows your health care provider to track your heart activity for several days, if needed. °· Stress tests. These can be done through exercise or by taking medicine that makes your heart beat more quickly. °· Blood tests. °· Imaging tests. °TREATMENT  °Your treatment depends on what is causing your chest pain. Treatment may include: °· Medicines. These may include: °¨ Acid blockers for heartburn. °¨ Anti-inflammatory medicine. °¨ Pain medicine for inflammatory conditions. °¨ Antibiotic medicine, if an infection is present. °¨ Medicines to dissolve blood clots. °¨ Medicines to treat coronary artery disease. °· Supportive care for conditions that do not require medicines. This may include: °¨ Resting. °¨ Applying heat   or cold packs to injured areas. °¨ Limiting activities until pain decreases. °HOME CARE INSTRUCTIONS °· If you were prescribed an antibiotic medicine, finish it all even if you start to feel better. °· Avoid any activities that bring on chest pain. °· Do not use any tobacco products, including  cigarettes, chewing tobacco, or electronic cigarettes. If you need help quitting, ask your health care provider. °· Do not drink alcohol. °· Take medicines only as directed by your health care provider. °· Keep all follow-up visits as directed by your health care provider. This is important. This includes any further testing if your chest pain does not go away. °· If heartburn is the cause for your chest pain, you may be told to keep your head raised (elevated) while sleeping. This reduces the chance that acid will go from your stomach into your esophagus. °· Make lifestyle changes as directed by your health care provider. These may include: °¨ Getting regular exercise. Ask your health care provider to suggest some activities that are safe for you. °¨ Eating a heart-healthy diet. A registered dietitian can help you to learn healthy eating options. °¨ Maintaining a healthy weight. °¨ Managing diabetes, if necessary. °¨ Reducing stress. °SEEK MEDICAL CARE IF: °· Your chest pain does not go away after treatment. °· You have a rash with blisters on your chest. °· You have a fever. °SEEK IMMEDIATE MEDICAL CARE IF:  °· Your chest pain is worse. °· You have an increasing cough, or you cough up blood. °· You have severe abdominal pain. °· You have severe weakness. °· You faint. °· You have chills. °· You have sudden, unexplained chest discomfort. °· You have sudden, unexplained discomfort in your arms, back, neck, or jaw. °· You have shortness of breath at any time. °· You suddenly start to sweat, or your skin gets clammy. °· You feel nauseous or you vomit. °· You suddenly feel light-headed or dizzy. °· Your heart begins to beat quickly, or it feels like it is skipping beats. °These symptoms may represent a serious problem that is an emergency. Do not wait to see if the symptoms will go away. Get medical help right away. Call your local emergency services (911 in the [place] Do not drive yourself to the hospital. °  °This  information is not intended to replace advice given to you by your health care provider. Make sure you discuss any questions you have with your health care provider. °  °Document Released: 11/19/2004 Document Revised: 03/02/2014 Document Reviewed: 09/15/2013 °Elsevier Interactive Patient Education ©2016 [employer] ° °

## 2015-11-16 MED ORDER — VALSARTAN 160 MG PO TABS: 320.0000 mg | ORAL_TABLET | Freq: Every day | ORAL | 3 refills | Status: DC

## 2015-11-16 MED ORDER — METOPROLOL SUCCINATE ER 100 MG PO TB24: 100.0000 mg | ORAL_TABLET | Freq: Every day | ORAL | 3 refills | Status: DC

## 2015-11-16 MED ORDER — ATORVASTATIN CALCIUM 10 MG PO TABS: 10.0000 mg | ORAL_TABLET | Freq: Every day | ORAL | 1 refills | Status: DC

## 2015-11-16 MED ORDER — PANTOPRAZOLE SODIUM 40 MG PO TBEC: 40.0000 mg | DELAYED_RELEASE_TABLET | Freq: Every day | ORAL | 1 refills | Status: DC

## 2015-11-16 MED ORDER — AMLODIPINE BESYLATE 5 MG PO TABS: 5.0000 mg | ORAL_TABLET | Freq: Every day | ORAL | 1 refills | Status: DC

## 2015-11-16 NOTE — Addendum Note (Signed)
Addended by: Roma Schanz R on: 11/16/2015 02:55 PM   Modules accepted: Orders

## 2015-11-25 ENCOUNTER — Telehealth: Payer: Self-pay | Admitting: Family Medicine

## 2015-11-25 NOTE — Telephone Encounter (Signed)
I never got info for it.  She was going to find out where she got her last one .... I'm pretty sure I gave her rx for one.

## 2015-11-25 NOTE — Telephone Encounter (Signed)
Caller name: Geanine Relation to pt: self Call back number: [phone] Pharmacy:  Reason for call: Pt came in office to verify if she had a request from PCP a belt that can help her out with back pain, if done please let pt know where it was ordered or with whom so that she can be aware of receiving belt. Please advise.

## 2015-11-25 NOTE — Telephone Encounter (Signed)
Dr.Lowne where did you send the script for the lumbar support belt, or was it given to the patient?     KP

## 2015-11-25 NOTE — Telephone Encounter (Signed)
You did , she said she got a call and they said they were faxing information, however we have not received anything at this time.    KP

## 2015-11-27 ENCOUNTER — Ambulatory Visit (HOSPITAL_BASED_OUTPATIENT_CLINIC_OR_DEPARTMENT_OTHER)
Admission: RE | Admit: 2015-11-27 | Discharge: 2015-11-27 | Disposition: A | Discharge status: Home / Self Care | Payer: Medicare Other | Source: Ambulatory Visit | Attending: Family Medicine | Admitting: Family Medicine

## 2015-11-27 DIAGNOSIS — R0602 Shortness of breath: Secondary | ICD-10-CM

## 2015-11-27 DIAGNOSIS — R079 Chest pain, unspecified: Secondary | ICD-10-CM | POA: Diagnosis not present

## 2015-11-27 DIAGNOSIS — I071 Rheumatic tricuspid insufficiency: Secondary | ICD-10-CM | POA: Diagnosis not present

## 2015-11-27 NOTE — Progress Notes (Signed)
  Echocardiogram 2D Echocardiogram has been performed.  Mary Flynn 11/27/2015, 10:17 AM

## 2015-12-03 ENCOUNTER — Telehealth: Payer: Self-pay | Admitting: Family Medicine

## 2015-12-03 ENCOUNTER — Encounter: Payer: Self-pay | Admitting: Family Medicine

## 2015-12-03 NOTE — Telephone Encounter (Signed)
Pt came in office stating that she called insurance [facility]) and asked about the lumbar back belt, at the insurance she was told that she needed to have provider to call at [phone] for the insurance to sent [place] a form by fax to our office so provider can fill out for pt, pt insisted that it had to be done with the Provider. Please advise.

## 2015-12-10 NOTE — Telephone Encounter (Signed)
[employer] does not know what the patient is talking about, I advised the patient and suggested that she call [facility] at [phone] and she verbalized understanding. The do not file insurance but the patient can purchase the equipment and get the insurance company to reimburse her. She verbalized understanding and has agreed to call.    KP

## 2015-12-10 NOTE — Telephone Encounter (Signed)
[employer] does not know what the patient is talking about, I advised the patient and suggested that she call [facility] at [phone] and she verbalized understanding. The do not file insurance but the patient can purchase the equipment and get the insurance company to reimburse her. She verbalized understanding and has agreed to call.    KP

## 2015-12-11 ENCOUNTER — Telehealth: Payer: Self-pay | Admitting: Family Medicine

## 2015-12-11 DIAGNOSIS — I519 Heart disease, unspecified: Secondary | ICD-10-CM

## 2015-12-11 DIAGNOSIS — R0602 Shortness of breath: Secondary | ICD-10-CM

## 2015-12-11 DIAGNOSIS — R079 Chest pain, unspecified: Secondary | ICD-10-CM

## 2015-12-11 NOTE — Telephone Encounter (Signed)
Caller name:Jalin [name] Relationship to patient: Can be reached:[phone] Pharmacy:  Reason for call:Wants to move forward with Cardiology appt, please advise

## 2015-12-12 ENCOUNTER — Other Ambulatory Visit (INDEPENDENT_AMBULATORY_CARE_PROVIDER_SITE_OTHER): Payer: Medicare Other

## 2015-12-12 DIAGNOSIS — E1165 Type 2 diabetes mellitus with hyperglycemia: Secondary | ICD-10-CM

## 2015-12-12 DIAGNOSIS — Z794 Long term (current) use of insulin: Secondary | ICD-10-CM | POA: Diagnosis not present

## 2015-12-12 LAB — COMPREHENSIVE METABOLIC PANEL
ALBUMIN: 4.4 g/dL (ref 3.5–5.2)
ALK PHOS: 52 U/L (ref 39–117)
ALT: 28 U/L (ref 0–35)
AST: 28 U/L (ref 0–37)
BUN: 11 mg/dL (ref 6–23)
CALCIUM: 9.8 mg/dL (ref 8.4–10.5)
CHLORIDE: 102 meq/L (ref 96–112)
CO2: 29 mEq/L (ref 19–32)
CREATININE: 0.78 mg/dL (ref 0.40–1.20)
GFR: 75.69 mL/min (ref >60.00)
Glucose, Bld: 111 mg/dL — ABNORMAL HIGH (ref 70–99)
POTASSIUM: 4.5 meq/L (ref 3.5–5.1)
Sodium: 140 mEq/L (ref 135–145)
TOTAL PROTEIN: 7 g/dL (ref 6.0–8.3)
Total Bilirubin: 1.1 mg/dL (ref 0.2–1.2)

## 2015-12-12 LAB — MICROALBUMIN / CREATININE URINE RATIO
CREATININE, U: 47.1 mg/dL
MICROALB UR: 1.4 mg/dL (ref 0.0–1.9)
Microalb Creat Ratio: 3 mg/g (ref 0.0–30.0)

## 2015-12-12 LAB — HEMOGLOBIN A1C: Hgb A1c MFr Bld: 7.6 % — ABNORMAL HIGH (ref 4.6–6.5)

## 2015-12-12 NOTE — Telephone Encounter (Signed)
Referral has been placed.     KP 

## 2015-12-13 ENCOUNTER — Other Ambulatory Visit: Payer: Self-pay | Admitting: Endocrinology

## 2015-12-13 ENCOUNTER — Other Ambulatory Visit: Payer: Self-pay | Admitting: Family Medicine

## 2015-12-13 DIAGNOSIS — E785 Hyperlipidemia, unspecified: Secondary | ICD-10-CM

## 2015-12-13 DIAGNOSIS — K219 Gastro-esophageal reflux disease without esophagitis: Secondary | ICD-10-CM

## 2015-12-17 ENCOUNTER — Ambulatory Visit (INDEPENDENT_AMBULATORY_CARE_PROVIDER_SITE_OTHER): Payer: Medicare Other | Admitting: Endocrinology

## 2015-12-17 VITALS — BP 126/68 | HR 91 | Temp 97.8°F | Resp 14 | Ht 59.0 in | Wt 130.6 lb

## 2015-12-17 DIAGNOSIS — E1165 Type 2 diabetes mellitus with hyperglycemia: Secondary | ICD-10-CM | POA: Diagnosis not present

## 2015-12-17 DIAGNOSIS — Z794 Long term (current) use of insulin: Secondary | ICD-10-CM

## 2015-12-17 NOTE — Progress Notes (Signed)
Patient ID: Mary Flynn, female   DOB: Jun 25, 1934, 81 y.o.   MRN: [id number]   Reason for Appointment: Diabetes follow-up   History of Present Illness   Diagnosis: Type 2 DIABETES MELITUS, date of diagnosis:   1995    Previous history: She has been on various regimens of hypoglycemic drugs over the last several years including Actos, Amaryl and Januvia. Because of cost her Januvia was stopped and Actos was stopped because of potential weight gain She was initially started on Levemir insulin because of fasting hyperglycemia in 2009 and the dose has been adjusted periodically In the last year she is also quite mealtime coverage because of postprandial hyperglycemia despite usually eating a low carbohydrate diet   Recent history:   Insulin regimen: Levemir 14 units hs;  Novolog4-6 before meals based on meal size   Oral hypoglycemic drugs: Amaryl 4 mg twice a day, metformin, Invokana 100 mg daily  She has not been regular with her follow-up again  Her A1c has been persistently over 8% and now with starting Invokana she has improvement with A1c down to 7.6   Current blood sugar patterns and problems identified:  FASTING blood sugars are very consistent recently and averaging about 120  With starting Invokana she has cut back her Levemir by 4 units on her own  She will eat erratically and not always balanced meals, eating cereal in the morning  She had only one low blood sugar at about 2 PM and she does not know whether this was after lunch and how much insulin she took good  Difficult to get an accurate history from her  No readings after supper to review  Blood sugars are somewhat higher at suppertime when she checks them but not consistently   she does not mark her postprandial readings on the monitor  Hypoglycemia :  none documented but she feels some weakness when blood sugars are in the 70s and 80s    Side effects from medications: None.          Proper timing  of medications in relation to meals: She is taking her NovoLog right after eating at times       Monitors blood glucose: Once or twice a day.    Glucometer:  One Touch       Blood Glucose readings from meter download:   Mean values apply above for all meters except median for One Touch  PRE-MEAL 7-9 AM  Lunch Dinner Bedtime Overall  Glucose range: 81-158   73-191   54-205   Mean/median: 116   165   119     Meals: 3 meals per day. she is a vegetarian. Trying to get some protein at most meals including dairy products and tofu  Has Bfst 10 am,  lunch around 2 PM, dinner at 8 PM       Physical activity: exercise: was walking 2 miles and Gym periodically     Last visit with dietitian: 2009   Wt Readings from Last 3 Encounters:  12/17/15 130 lb 9.6 oz (59.2 kg)  11/15/15 135 lb (61.2 kg)  06/18/15 132 lb (59.9 kg)    LABS:  Lab Results  Component Value Date   HGBA1C 7.6 (H) 12/12/2015   HGBA1C 8.3 (H) 03/22/2015   HGBA1C 8.3 (H) 03/12/2015   Lab Results  Component Value Date   MICROALBUR 1.4 12/12/2015   LDLCALC 23 11/15/2015   CREATININE 0.78 12/12/2015       Medication  List       Accurate as of 12/17/15  1:27 PM. Always use your most recent med list.          amLODipine 5 MG tablet Commonly known as:  NORVASC Take 1 tablet (5 mg total) by mouth daily.   aspirin EC 81 MG tablet Take 81 mg by mouth daily.   atorvastatin 10 MG tablet Commonly known as:  LIPITOR TAKE 1 TABLET BY MOUTH  EVERY DAY   b complex vitamins tablet Take 1 tablet by mouth daily.   canagliflozin 100 MG Tabs tablet Commonly known as:  INVOKANA Take 1 tablet (100 mg total) by mouth daily before breakfast.   fluticasone 50 MCG/ACT nasal spray Commonly known as:  FLONASE Place 2 sprays into both nostrils daily.   gabapentin 300 MG capsule Commonly known as:  NEURONTIN Take 1 capsule by mouth 3  times daily   glimepiride 4 MG tablet Commonly known as:  AMARYL TAKE 1 TABLET BY  MOUTH  TWICE A DAY   glucose blood test strip Commonly known as:  ONE TOUCH ULTRA TEST Use to test blood sugar 2 times daily as instructed. Dx code: E11.49   ONE TOUCH ULTRA TEST test strip Generic drug:  glucose blood TEST TWO TIMES DAILY   Insulin Detemir 100 UNIT/ML Pen Commonly known as:  LEVEMIR FLEXPEN Inject 14 Units into the skin at bedtime.   insulin lispro 100 UNIT/ML KiwkPen Commonly known as:  HUMALOG KWIKPEN Inject 4 units at breakfast, 6 units at lunch and 4 units at dinner.   Insulin Pen Needle 32G X 4 MM Misc Use 3 per day   meclizine 25 MG tablet Commonly known as:  ANTIVERT Take 1 tablet (25 mg total) by mouth 4 (four) times daily as needed for dizziness.   meloxicam 15 MG tablet Commonly known as:  MOBIC Take 1 tablet (15 mg total) by mouth daily.   metFORMIN 1000 MG tablet Commonly known as:  GLUCOPHAGE Take 1 tablet by mouth  twice a day with meals   metoprolol succinate 100 MG 24 hr tablet Commonly known as:  TOPROL-XL Take 1 tablet (100 mg total) by mouth daily. Take with or immediately following a meal.   mirtazapine 15 MG tablet Commonly known as:  REMERON Take 1 tablet (15 mg total) by mouth at bedtime.   niacin 500 MG CR tablet Commonly known as:  NIASPAN Take 1,000 mg by mouth at bedtime.   NONFORMULARY OR COMPOUNDED ITEM Lumbar support belt  #1  As directed   ONE TOUCH ULTRA 2 w/Device Kit Use as directed to test  blood sugar two times daily   pantoprazole 40 MG tablet Commonly known as:  PROTONIX TAKE 1 TABLET BY MOUTH  DAILY   traMADol 50 MG tablet Commonly known as:  ULTRAM Take 1 tablet (50 mg total) by mouth every 6 (six) hours as needed.   valsartan 160 MG tablet Commonly known as:  DIOVAN Take 2 tablets (320 mg total) by mouth daily.       Allergies:  Allergies  Allergen Reactions  . Ace Inhibitors Cough  . Losartan Cough  . Penicillins Hives  . Sulfa Antibiotics Hives  . Sulfonamide Derivatives     Past  Medical History:  Diagnosis Date  . Diabetes mellitus   . Hyperlipidemia   . Hypertension   . Left rib fracture 02/18/2014    Past Surgical History:  Procedure Laterality Date  . APPENDECTOMY    . REFRACTIVE SURGERY  Left Eye  . surgical sterilization  40 years ago    Family History  Problem Relation Age of Onset  . Depression    . Cancer      laryngeal  . Diabetes    . Diabetes Mother     Social History:  reports that she has never smoked. She has never used smokeless tobacco. She reports that she does not drink alcohol or use drugs.  Review of Systems:  Hypertension:    On  Diovan 160 mg, metoprolol and amlodipine, followed by PCP Blood pressure appears to be better Now She does not monitor at home regularly and blood pressure is followed by PCP  No further problems with hyponatremia  Lab Results  Component Value Date   CREATININE 0.78 12/12/2015   BUN 11 12/12/2015   NA 140 12/12/2015   K 4.5 12/12/2015   CL 102 12/12/2015   CO2 29 12/12/2015     Lipids: Has had diabetic dyslipidemia,  well controlled and HDL is also fairly good  Lab Results  Component Value Date   CHOL 98 11/15/2015   HDL 50.00 11/15/2015   LDLCALC 23 11/15/2015   TRIG 122.0 11/15/2015   CHOLHDL 2 11/15/2015    Neuropathy: She previously had sharp pains in her lower legs but no numbness She is.still on gabapentin and she does not know why, has no pain or burning in her feet  Diabetic foot exam done in 10/17  Diabetic foot exam showed normal monofilament sensation in the toes and plantar surfaces, no skin lesions or ulcers on the feet and normal pedal pulses  She has been regular with her eye exams annually    Examination:   BP 126/68   Pulse 91   Temp 97.8 F (36.6 C)   Resp 14   Ht '4\' 11"'$  (1.499 m)   Wt 130 lb 9.6 oz (59.2 kg)   SpO2 95%   BMI 26.38 kg/m   Body mass index is 26.38 kg/m.   Diabetic Foot Exam - Simple   Simple Foot Form Diabetic Foot exam was  performed with the following findings:  Yes 12/17/2015  1:18 PM  Visual Inspection No deformities, no ulcerations, no other skin breakdown bilaterally:  Yes Sensation Testing Intact to touch and monofilament testing bilaterally:  Yes Pulse Check Posterior Tibialis and Dorsalis pulse intact bilaterally:  Yes Comments      ASSESSMENT/ PLAN:   Diabetes type 2  With BMI 26  See history of present illness for detailed discussion of  current management, blood sugar patterns and problems identified  With starting Invokana she has overall better controlled and A1c is finally below 8% Again however she is not checking postprandial readings enough Does have sporadic high readings at suppertime and not clear why  Recommendations:   Reminded her to check more readings consistently after meals especially after supper to help adjust her mealtime doses.  She needs to try and take Humalog consistently before eating instead of after  May reduce Amaryl to half tablet in evening to prevent overnight hypoglycemia   Consider changing Levemir to [place] for better 24 control when she runs out   She needs to consistently have protein at every meal which she may not be doing especially in the mornings.  Increase walking as tolerated.  Consider stopping Amaryl if tending to get low sugars during the day  No change in Levemir at present  HYPERTENSION: This is well controlled without orthostatic symptoms, may be improved with adding  Invokana   Dyspnea: She says this is occurring only when she is bending down but not on walking and.if she is symptomatic from her mild diastolic dysfunction.  She will discuss further with her PCP or cardiologist   Patient Instructions  3 Humalog for small meals  Must take insulin just before meals  Must check at 10 pm also  Glimeperide 1/2 only at dinner   May stop Gabapentin if not having pain    Counseling time on subjects discussed above is over 50% of  today's 25 minute visit   Khup Sapia 12/17/2015, 1:27 PM

## 2015-12-17 NOTE — Patient Instructions (Addendum)
3 Humalog for small meals  Must take insulin just before meals  Must check at 10 pm also  Glimeperide 1/2 only at dinner   May stop Gabapentin if not having pain

## 2015-12-19 ENCOUNTER — Ambulatory Visit (INDEPENDENT_AMBULATORY_CARE_PROVIDER_SITE_OTHER): Payer: Medicare Other | Admitting: Family Medicine

## 2015-12-19 ENCOUNTER — Encounter: Payer: Self-pay | Admitting: Family Medicine

## 2015-12-19 VITALS — BP 118/60 | HR 79 | Temp 98.9°F | Resp 16 | Ht <= 58 in | Wt 132.0 lb

## 2015-12-19 DIAGNOSIS — E785 Hyperlipidemia, unspecified: Secondary | ICD-10-CM

## 2015-12-19 DIAGNOSIS — E2839 Other primary ovarian failure: Secondary | ICD-10-CM

## 2015-12-19 DIAGNOSIS — Z0000 Encounter for general adult medical examination without abnormal findings: Secondary | ICD-10-CM

## 2015-12-19 DIAGNOSIS — G4700 Insomnia, unspecified: Secondary | ICD-10-CM

## 2015-12-19 DIAGNOSIS — R296 Repeated falls: Secondary | ICD-10-CM

## 2015-12-19 DIAGNOSIS — E1140 Type 2 diabetes mellitus with diabetic neuropathy, unspecified: Secondary | ICD-10-CM

## 2015-12-19 DIAGNOSIS — I10 Essential (primary) hypertension: Secondary | ICD-10-CM | POA: Diagnosis not present

## 2015-12-19 DIAGNOSIS — M545 Low back pain: Secondary | ICD-10-CM

## 2015-12-19 DIAGNOSIS — K219 Gastro-esophageal reflux disease without esophagitis: Secondary | ICD-10-CM

## 2015-12-19 MED ORDER — NONFORMULARY OR COMPOUNDED ITEM: 0 refills | Status: AC

## 2015-12-19 NOTE — Progress Notes (Signed)
Subjective:   Mary Flynn is a 81 y.o. female who presents for Medicare Annual (Subsequent) preventive examination.  Review of Systems:   Review of Systems  Constitutional: Negative for activity change, appetite change and fatigue.  HENT: Negative for hearing loss, congestion, tinnitus and ear discharge.   Eyes: Negative for visual disturbance (see optho q1y -- vision corrected to 20/20 with glasses).  Respiratory: Negative for cough, chest tightness  + doe Cardiovascular: Negative for chest pain, palpitations and leg swelling.  Gastrointestinal: Negative for abdominal pain, diarrhea, constipation and abdominal distention.  Genitourinary: Negative for urgency, frequency, decreased urine volume and difficulty urinating.  Musculoskeletal: + back pain Skin: Negative for color change, pallor and rash.  Neurological: Negative for dizziness, light-headedness, numbness and headaches.  Hematological: Negative for adenopathy. Does not bruise/bleed easily.  Psychiatric/Behavioral: Negative for suicidal ideas, confusion, sleep disturbance, self-injury, dysphoric mood, decreased concentration and agitation.  Pt is able to read and write and can do all ADLs + frequent falling  No abuse/ violence in home         Objective:     Vitals: BP 118/60 (BP Location: Left Arm, Patient Position: Sitting, Cuff Size: Normal)   Pulse 79   Temp 98.9 F (37.2 C) (Oral)   Resp 16   Ht 4' 9" (1.448 m)   Wt 132 lb (59.9 kg)   SpO2 98%   BMI 28.56 kg/m   Body mass index is 28.56 kg/m. BP 118/60 (BP Location: Left Arm, Patient Position: Sitting, Cuff Size: Normal)   Pulse 79   Temp 98.9 F (37.2 C) (Oral)   Resp 16   Ht 4' 9" (1.448 m)   Wt 132 lb (59.9 kg)   SpO2 98%   BMI 28.56 kg/m  General appearance: alert, cooperative, appears stated age and no distress Head: Normocephalic, without obvious abnormality, atraumatic Eyes: conjunctivae/corneas clear. PERRL, EOM's intact. Fundi benign. Ears:  normal TM's and external ear canals both ears Nose: Nares normal. Septum midline. Mucosa normal. No drainage or sinus tenderness. Throat: lips, mucosa, and tongue normal; teeth and gums normal Neck: no adenopathy, no carotid bruit, no JVD, supple, symmetrical, trachea midline and thyroid not enlarged, symmetric, no tenderness/mass/nodules Back: symmetric, no curvature. ROM normal. No CVA tenderness. Lungs: clear to auscultation bilaterally Breasts: normal appearance, no masses or tenderness Heart: regular rate and rhythm, S1, S2 normal, no murmur, click, rub or gallop Abdomen: soft, non-tender; bowel sounds normal; no masses,  no organomegaly Pelvic: not indicated; post-menopausal, no abnormal Pap smears in past Extremities: extremities normal, atraumatic, no cyanosis or edema Pulses: 2+ and symmetric Skin: Skin color, texture, turgor normal. No rashes or lesions Lymph nodes: Cervical, supraclavicular, and axillary nodes normal. Neurologic: Alert and oriented X 3, normal strength and tone. Normal symmetric reflexes. Normal coordination and gait Tobacco History  Smoking Status  . Never Smoker  Smokeless Tobacco  . Never Used     Counseling given: Not Answered   Past Medical History:  Diagnosis Date  . Diabetes mellitus   . Hyperlipidemia   . Hypertension   . Left rib fracture 02/18/2014   Past Surgical History:  Procedure Laterality Date  . APPENDECTOMY    . REFRACTIVE SURGERY     Left Eye  . surgical sterilization  40 years ago   Family History  Problem Relation Age of Onset  . Diabetes Mother   . Depression    . Cancer      laryngeal  . Diabetes  History  Sexual Activity  . Sexual activity: Not Currently    Outpatient Encounter Prescriptions as of 12/19/2015  Medication Sig  . amLODipine (NORVASC) 5 MG tablet Take 1 tablet (5 mg total) by mouth daily.  Marland Kitchen aspirin EC 81 MG tablet Take 81 mg by mouth daily.  Marland Kitchen atorvastatin (LIPITOR) 10 MG tablet Take 1 tablet  (10 mg total) by mouth daily.  Marland Kitchen b complex vitamins tablet Take 1 tablet by mouth daily.  . Blood Glucose Monitoring Suppl (ONE TOUCH ULTRA 2) w/Device KIT Use as directed to test  blood sugar two times daily  . canagliflozin (INVOKANA) 100 MG TABS tablet Take 1 tablet (100 mg total) by mouth daily before breakfast.  . fluticasone (FLONASE) 50 MCG/ACT nasal spray Place 2 sprays into both nostrils daily.  Marland Kitchen gabapentin (NEURONTIN) 300 MG capsule Take 1 capsule (300 mg total) by mouth 3 (three) times daily.  Marland Kitchen glimepiride (AMARYL) 4 MG tablet Take 1 tablet (4 mg total) by mouth 2 (two) times daily.  Marland Kitchen glucose blood (ONE TOUCH ULTRA TEST) test strip Use to test blood sugar 2 times daily as instructed. Dx code: E11.49  . Insulin Detemir (LEVEMIR FLEXPEN) 100 UNIT/ML Pen Inject 14 Units into the skin at bedtime.  . insulin lispro (HUMALOG KWIKPEN) 100 UNIT/ML KiwkPen Inject into the skin as needed. Inject 4 units at breakfast, 6 units at lunch and 4 units at dinner.  . Insulin Pen Needle 32G X 4 MM MISC Use 3 per day  . meloxicam (MOBIC) 15 MG tablet Take 1 tablet (15 mg total) by mouth daily.  . metFORMIN (GLUCOPHAGE) 1000 MG tablet Take 1 tablet (1,000 mg total) by mouth 2 (two) times daily with a meal.  . metoprolol succinate (TOPROL-XL) 100 MG 24 hr tablet Take 1 tablet (100 mg total) by mouth daily. Take with or immediately following a meal.  . mirtazapine (REMERON) 15 MG tablet Take 1 tablet (15 mg total) by mouth at bedtime.  . niacin (NIASPAN) 500 MG CR tablet Take 1,000 mg by mouth at bedtime.  . NONFORMULARY OR COMPOUNDED ITEM Lumbar support belt  #1  As directed  . ONE TOUCH ULTRA TEST test strip TEST TWO TIMES DAILY  . pantoprazole (PROTONIX) 40 MG tablet Take 1 tablet (40 mg total) by mouth daily.  . traMADol (ULTRAM) 50 MG tablet Take 1 tablet (50 mg total) by mouth every 6 (six) hours as needed.  . valsartan (DIOVAN) 160 MG tablet Take 2 tablets (320 mg total) by mouth daily.  .  [DISCONTINUED] amLODipine (NORVASC) 5 MG tablet Take 1 tablet (5 mg total) by mouth daily.  . [DISCONTINUED] atorvastatin (LIPITOR) 10 MG tablet TAKE 1 TABLET BY MOUTH  EVERY DAY  . [DISCONTINUED] canagliflozin (INVOKANA) 100 MG TABS tablet Take 1 tablet (100 mg total) by mouth daily before breakfast.  . [DISCONTINUED] gabapentin (NEURONTIN) 300 MG capsule Take 1 capsule by mouth 3  times daily  . [DISCONTINUED] glimepiride (AMARYL) 4 MG tablet TAKE 1 TABLET BY MOUTH  TWICE A DAY  . [DISCONTINUED] Insulin Detemir (LEVEMIR FLEXPEN) 100 UNIT/ML Pen Inject 14 Units into the skin at bedtime.  . [DISCONTINUED] insulin lispro (HUMALOG KWIKPEN) 100 UNIT/ML KiwkPen Inject 4 units at breakfast, 6 units at lunch and 4 units at dinner. (Patient taking differently: as needed. Inject 4 units at breakfast, 6 units at lunch and 4 units at dinner.)  . [DISCONTINUED] metFORMIN (GLUCOPHAGE) 1000 MG tablet Take 1 tablet by mouth  twice a day with  meals  . [DISCONTINUED] metoprolol succinate (TOPROL-XL) 100 MG 24 hr tablet Take 1 tablet (100 mg total) by mouth daily. Take with or immediately following a meal.  . [DISCONTINUED] mirtazapine (REMERON) 15 MG tablet Take 1 tablet (15 mg total) by mouth at bedtime.  . [DISCONTINUED] NONFORMULARY OR COMPOUNDED ITEM Lumbar support belt  #1  As directed  . [DISCONTINUED] pantoprazole (PROTONIX) 40 MG tablet TAKE 1 TABLET BY MOUTH  DAILY  . [DISCONTINUED] traMADol (ULTRAM) 50 MG tablet Take 1 tablet (50 mg total) by mouth every 6 (six) hours as needed.  . [DISCONTINUED] valsartan (DIOVAN) 160 MG tablet Take 2 tablets (320 mg total) by mouth daily.  . [DISCONTINUED] meclizine (ANTIVERT) 25 MG tablet Take 1 tablet (25 mg total) by mouth 4 (four) times daily as needed for dizziness. (Patient not taking: Reported on 12/19/2015)   No facility-administered encounter medications on file as of 12/19/2015.     Activities of Daily Living In your present state of health, do you have any  difficulty performing the following activities: 12/19/2015 06/13/2015  Hearing? N N  Vision? Y Y  Difficulty concentrating or making decisions? N N  Walking or climbing stairs? Y Y  Dressing or bathing? N N  Doing errands, shopping? Y Y  Conservation officer, nature and eating ? - N  Using the Toilet? - N  In the past six months, have you accidently leaked urine? - N  Do you have problems with loss of bowel control? - N  Managing your Medications? - N  Managing your Finances? - N  Housekeeping or managing your Housekeeping? - N  Some recent data might be hidden    Patient Care Team: Ann Held, DO as PCP - General Elayne Snare, MD as Consulting Physician (Endocrinology) Kristeen Miss, MD as Consulting Physician (Neurosurgery) Nelva Bush, MD as Consulting Physician (Cardiology) Warden Fillers, MD as Consulting Physician (Ophthalmology)    Assessment:   CPE Exercise Activities and Dietary recommendations Current Exercise Habits: The patient does not participate in regular exercise at present, Exercise limited by: cardiac condition(s);orthopedic condition(s)  Goals    . Blood Pressure < 140/90    . HEMOGLOBIN A1C < 8.0      Fall Risk Fall Risk  12/19/2015 06/13/2015 02/14/2015 02/02/2014 11/18/2012  Falls in the past year? Yes Yes No No No  Number falls in past yr: 2 or more 1 - - -  Injury with Fall? No Yes - - -  Risk Factor Category  - High Fall Risk - - -  Risk for fall due to : Impaired mobility Impaired balance/gait - - -  Follow up Education provided Education provided;Falls prevention discussed - - -   Depression Screen PHQ 2/9 Scores 12/19/2015 06/13/2015 04/25/2015 02/14/2015  PHQ - 2 Score 0 0 0 0     Cognitive Function MMSE - Mini Mental State Exam 12/19/2015 06/13/2015  Orientation to time 5 5  Orientation to Place 5 5  Registration 3 3  Attention/ Calculation 5 5  Recall 3 2  Language- name 2 objects 2 2  Language- repeat 1 1  Language- follow 3 step  command 3 3  Language- read & follow direction 1 1  Write a sentence 1 1  Copy design 1 1  Total score 30 29        Immunization History  Administered Date(s) Administered  . Influenza Split 11/24/2010  . Influenza Whole 11/30/2007, 12/05/2008, 12/04/2009  . Influenza, High Dose Seasonal PF 11/17/2013, 11/08/2014, 11/15/2015  .  Influenza,inj,Quad PF,36+ Mos 11/18/2012  . PPD Test 02/16/2011, 04/14/2011  . Pneumococcal Conjugate-13 02/02/2014  . Pneumococcal Polysaccharide-23 12/09/2007  . Td 10/12/2006  . Zoster 06/04/2010   Screening Tests Health Maintenance  Topic Date Due  . OPHTHALMOLOGY EXAM  01/30/2016  . MAMMOGRAM  05/22/2016  . HEMOGLOBIN A1C  06/11/2016  . TETANUS/TDAP  10/11/2016  . FOOT EXAM  12/16/2016  . INFLUENZA VACCINE  Completed  . DEXA SCAN  Completed  . ZOSTAVAX  Completed  . PNA vac Low Risk Adult  Completed      Plan:   see AVS During the course of the visit the patient was educated and counseled about the following appropriate screening and preventive services:   Vaccines to include Pneumoccal, Influenza, Hepatitis B, Td, Zostavax, HCV  Electrocardiogram  Cardiovascular Disease  Colorectal cancer screening  Bone density screening  Diabetes screening  Glaucoma screening  Mammography/PAP  Nutrition counseling   Patient Instructions (the written plan) was given to the patient.  1. Low back pain, unspecified back pain laterality, unspecified chronicity, with sciatica presence unspecified  - NONFORMULARY OR COMPOUNDED ITEM; Lumbar support belt  #1  As directed  Dispense: 1 each; Refill: 0  2. Hyperlipidemia LDL goal <70  - Lipid panel - Comprehensive metabolic panel - CBC with Differential/Platelet - POCT urinalysis dipstick  3. Essential hypertension stable - CBC with Differential/Platelet - POCT urinalysis dipstick - valsartan (DIOVAN) 160 MG tablet; Take 2 tablets (320 mg total) by mouth daily.  Dispense: 180 tablet; Refill:  3 - metoprolol succinate (TOPROL-XL) 100 MG 24 hr tablet; Take 1 tablet (100 mg total) by mouth daily. Take with or immediately following a meal.  Dispense: 90 tablet; Refill: 3 - amLODipine (NORVASC) 5 MG tablet; Take 1 tablet (5 mg total) by mouth daily.  Dispense: 90 tablet; Refill: 1  4. Estrogen deficiency   - DG Bone Density; Future  5. Frequent falls Unsure why---  Refer home health for evaluation - Ambulatory referral to [facility]  6. Encounter for Medicare annual wellness exam See above  7. Gastroesophageal reflux disease, esophagitis presence not specified   - pantoprazole (PROTONIX) 40 MG tablet; Take 1 tablet (40 mg total) by mouth daily.  Dispense: 90 tablet; Refill: 1  8. Insomnia, unspecified type   - mirtazapine (REMERON) 15 MG tablet; Take 1 tablet (15 mg total) by mouth at bedtime.  Dispense: 90 tablet; Refill: 1  9. Hyperlipidemia, unspecified hyperlipidemia type Check labs  - atorvastatin (LIPITOR) 10 MG tablet; Take 1 tablet (10 mg total) by mouth daily.  Dispense: 90 tablet; Refill: 1  10. Controlled type 2 diabetes mellitus with diabetic neuropathy, without long-term current use of insulin (HCC) Per endo - traMADol (ULTRAM) 50 MG tablet; Take 1 tablet (50 mg total) by mouth every 6 (six) hours as needed.  Dispense: 180 tablet; Refill: 1 - metFORMIN (GLUCOPHAGE) 1000 MG tablet; Take 1 tablet (1,000 mg total) by mouth 2 (two) times daily with a meal.  Dispense: 180 tablet; Refill: 1 - insulin lispro (HUMALOG KWIKPEN) 100 UNIT/ML KiwkPen; Inject into the skin as needed. Inject 4 units at breakfast, 6 units at lunch and 4 units at dinner. - Insulin Detemir (LEVEMIR FLEXPEN) 100 UNIT/ML Pen; Inject 14 Units into the skin at bedtime.  Dispense: 15 mL; Refill: 3 - glimepiride (AMARYL) 4 MG tablet; Take 1 tablet (4 mg total) by mouth 2 (two) times daily.  Dispense: 180 tablet; Refill: 0 - gabapentin (NEURONTIN) 300 MG capsule; Take 1 capsule (300  mg total) by  mouth 3 (three) times daily.  Dispense: 270 capsule; Refill: 1 - canagliflozin (INVOKANA) 100 MG TABS tablet; Take 1 tablet (100 mg total) by mouth daily before breakfast.  Dispense: 30 tablet; Refill: 0  Ann Held, DO  12/23/2015

## 2015-12-19 NOTE — Progress Notes (Signed)
Pre visit review using our clinic review tool, if applicable. No additional management support is needed unless otherwise documented below in the visit note. 

## 2015-12-19 NOTE — Patient Instructions (Signed)
Preventive Care for Adults, Female A healthy lifestyle and preventive care can promote health and wellness. Preventive health guidelines for women include the following key practices.  A routine yearly physical is a good way to check with your health care provider about your health and preventive screening. It is a chance to share any concerns and updates on your health and to receive a thorough exam.  Visit your dentist for a routine exam and preventive care every 6 months. Brush your teeth twice a day and floss once a day. Good oral hygiene prevents tooth decay and gum disease.  The frequency of eye exams is based on your age, health, family medical history, use of contact lenses, and other factors. Follow your health care provider's recommendations for frequency of eye exams.  Eat a healthy diet. Foods like vegetables, fruits, whole grains, low-fat dairy products, and lean protein foods contain the nutrients you need without too many calories. Decrease your intake of foods high in solid fats, added sugars, and salt. Eat the right amount of calories for you.Get information about a proper diet from your health care provider, if necessary.  Regular physical exercise is one of the most important things you can do for your health. Most adults should get at least 150 minutes of moderate-intensity exercise (any activity that increases your heart rate and causes you to sweat) each week. In addition, most adults need muscle-strengthening exercises on 2 or more days a week.  Maintain a healthy weight. The body mass index (BMI) is a screening tool to identify possible weight problems. It provides an estimate of body fat based on height and weight. Your health care provider can find your BMI and can help you achieve or maintain a healthy weight.For adults 20 years and older:  A BMI below 18.5 is considered underweight.  A BMI of 18.5 to 24.9 is normal.  A BMI of 25 to 29.9 is considered overweight.  A  BMI of 30 and above is considered obese.  Maintain normal blood lipids and cholesterol levels by exercising and minimizing your intake of saturated fat. Eat a balanced diet with plenty of fruit and vegetables. Blood tests for lipids and cholesterol should begin at age 45 and be repeated every 5 years. If your lipid or cholesterol levels are high, you are over 50, or you are at high risk for heart disease, you may need your cholesterol levels checked more frequently.Ongoing high lipid and cholesterol levels should be treated with medicines if diet and exercise are not working.  If you smoke, find out from your health care provider how to quit. If you do not use tobacco, do not start.  Lung cancer screening is recommended for adults aged 45-80 years who are at high risk for developing lung cancer because of a history of smoking. A yearly low-dose CT scan of the lungs is recommended for people who have at least a 30-pack-year history of smoking and are a current smoker or have quit within the past 15 years. A pack year of smoking is smoking an average of 1 pack of cigarettes a day for 1 year (for example: 1 pack a day for 30 years or 2 packs a day for 15 years). Yearly screening should continue until the smoker has stopped smoking for at least 15 years. Yearly screening should be stopped for people who develop a health problem that would prevent them from having lung cancer treatment.  If you are pregnant, do not drink alcohol. If you are  breastfeeding, be very cautious about drinking alcohol. If you are not pregnant and choose to drink alcohol, do not have more than 1 drink per day. One drink is considered to be 12 ounces (355 mL) of beer, 5 ounces (148 mL) of wine, or 1.5 ounces (44 mL) of liquor.  Avoid use of street drugs. Do not share needles with anyone. Ask for help if you need support or instructions about stopping the use of drugs.  High blood pressure causes heart disease and increases the risk  of stroke. Your blood pressure should be checked at least every 1 to 2 years. Ongoing high blood pressure should be treated with medicines if weight loss and exercise do not work.  If you are 55-79 years old, ask your health care provider if you should take aspirin to prevent strokes.  Diabetes screening is done by taking a blood sample to check your blood glucose level after you have not eaten for a certain period of time (fasting). If you are not overweight and you do not have risk factors for diabetes, you should be screened once every 3 years starting at age 45. If you are overweight or obese and you are 40-70 years of age, you should be screened for diabetes every year as part of your cardiovascular risk assessment.  Breast cancer screening is essential preventive care for women. You should practice "breast self-awareness." This means understanding the normal appearance and feel of your breasts and may include breast self-examination. Any changes detected, no matter how small, should be reported to a health care provider. Women in their 20s and 30s should have a clinical breast exam (CBE) by a health care provider as part of a regular health exam every 1 to 3 years. After age 40, women should have a CBE every year. Starting at age 40, women should consider having a mammogram (breast X-ray test) every year. Women who have a family history of breast cancer should talk to their health care provider about genetic screening. Women at a high risk of breast cancer should talk to their health care providers about having an MRI and a mammogram every year.  Breast cancer gene (BRCA)-related cancer risk assessment is recommended for women who have family members with BRCA-related cancers. BRCA-related cancers include breast, ovarian, tubal, and peritoneal cancers. Having family members with these cancers may be associated with an increased risk for harmful changes (mutations) in the breast cancer genes BRCA1 and  BRCA2. Results of the assessment will determine the need for genetic counseling and BRCA1 and BRCA2 testing.  Your health care provider may recommend that you be screened regularly for cancer of the pelvic organs (ovaries, uterus, and vagina). This screening involves a pelvic examination, including checking for microscopic changes to the surface of your cervix (Pap test). You may be encouraged to have this screening done every 3 years, beginning at age 21.  For women ages 30-65, health care providers may recommend pelvic exams and Pap testing every 3 years, or they may recommend the Pap and pelvic exam, combined with testing for human papilloma virus (HPV), every 5 years. Some types of HPV increase your risk of cervical cancer. Testing for HPV may also be done on women of any age with unclear Pap test results.  Other health care providers may not recommend any screening for nonpregnant women who are considered low risk for pelvic cancer and who do not have symptoms. Ask your health care provider if a screening pelvic exam is right for   you.  If you have had past treatment for cervical cancer or a condition that could lead to cancer, you need Pap tests and screening for cancer for at least 20 years after your treatment. If Pap tests have been discontinued, your risk factors (such as having a new sexual partner) need to be reassessed to determine if screening should resume. Some women have medical problems that increase the chance of getting cervical cancer. In these cases, your health care provider may recommend more frequent screening and Pap tests.  Colorectal cancer can be detected and often prevented. Most routine colorectal cancer screening begins at the age of 50 years and continues through age 75 years. However, your health care provider may recommend screening at an earlier age if you have risk factors for colon cancer. On a yearly basis, your health care provider may provide home test kits to check  for hidden blood in the stool. Use of a small camera at the end of a tube, to directly examine the colon (sigmoidoscopy or colonoscopy), can detect the earliest forms of colorectal cancer. Talk to your health care provider about this at age 50, when routine screening begins. Direct exam of the colon should be repeated every 5-10 years through age 75 years, unless early forms of precancerous polyps or small growths are found.  People who are at an increased risk for hepatitis B should be screened for this virus. You are considered at high risk for hepatitis B if:  You were born in a country where hepatitis B occurs often. Talk with your health care provider about which countries are considered high risk.  Your parents were born in a high-risk country and you have not received a shot to protect against hepatitis B (hepatitis B vaccine).  You have HIV or AIDS.  You use needles to inject street drugs.  You live with, or have sex with, someone who has hepatitis B.  You get hemodialysis treatment.  You take certain medicines for conditions like cancer, organ transplantation, and autoimmune conditions.  Hepatitis C blood testing is recommended for all people born from 1945 through 1965 and any individual with known risks for hepatitis C.  Practice safe sex. Use condoms and avoid high-risk sexual practices to reduce the spread of sexually transmitted infections (STIs). STIs include gonorrhea, chlamydia, syphilis, trichomonas, herpes, HPV, and human immunodeficiency virus (HIV). Herpes, HIV, and HPV are viral illnesses that have no cure. They can result in disability, cancer, and death.  You should be screened for sexually transmitted illnesses (STIs) including gonorrhea and chlamydia if:  You are sexually active and are younger than 24 years.  You are older than 24 years and your health care provider tells you that you are at risk for this type of infection.  Your sexual activity has changed  since you were last screened and you are at an increased risk for chlamydia or gonorrhea. Ask your health care provider if you are at risk.  If you are at risk of being infected with HIV, it is recommended that you take a prescription medicine daily to prevent HIV infection. This is called preexposure prophylaxis (PrEP). You are considered at risk if:  You are sexually active and do not regularly use condoms or know the HIV status of your partner(s).  You take drugs by injection.  You are sexually active with a partner who has HIV.  Talk with your health care provider about whether you are at high risk of being infected with HIV. If   you choose to begin PrEP, you should first be tested for HIV. You should then be tested every 3 months for as long as you are taking PrEP.  Osteoporosis is a disease in which the bones lose minerals and strength with aging. This can result in serious bone fractures or breaks. The risk of osteoporosis can be identified using a bone density scan. Women ages 67 years and over and women at risk for fractures or osteoporosis should discuss screening with their health care providers. Ask your health care provider whether you should take a calcium supplement or vitamin D to reduce the rate of osteoporosis.  Menopause can be associated with physical symptoms and risks. Hormone replacement therapy is available to decrease symptoms and risks. You should talk to your health care provider about whether hormone replacement therapy is right for you.  Use sunscreen. Apply sunscreen liberally and repeatedly throughout the day. You should seek shade when your shadow is shorter than you. Protect yourself by wearing long sleeves, pants, a wide-brimmed hat, and sunglasses year round, whenever you are outdoors.  Once a month, do a whole body skin exam, using a mirror to look at the skin on your back. Tell your health care provider of new moles, moles that have irregular borders, moles that  are larger than a pencil eraser, or moles that have changed in shape or color.  Stay current with required vaccines (immunizations).  Influenza vaccine. All adults should be immunized every year.  Tetanus, diphtheria, and acellular pertussis (Td, Tdap) vaccine. Pregnant women should receive 1 dose of Tdap vaccine during each pregnancy. The dose should be obtained regardless of the length of time since the last dose. Immunization is preferred during the 27th-36th week of gestation. An adult who has not previously received Tdap or who does not know her vaccine status should receive 1 dose of Tdap. This initial dose should be followed by tetanus and diphtheria toxoids (Td) booster doses every 10 years. Adults with an unknown or incomplete history of completing a 3-dose immunization series with Td-containing vaccines should begin or complete a primary immunization series including a Tdap dose. Adults should receive a Td booster every 10 years.  Varicella vaccine. An adult without evidence of immunity to varicella should receive 2 doses or a second dose if she has previously received 1 dose. Pregnant females who do not have evidence of immunity should receive the first dose after pregnancy. This first dose should be obtained before leaving the health care facility. The second dose should be obtained 4-8 weeks after the first dose.  Human papillomavirus (HPV) vaccine. Females aged 13-26 years who have not received the vaccine previously should obtain the 3-dose series. The vaccine is not recommended for use in pregnant females. However, pregnancy testing is not needed before receiving a dose. If a female is found to be pregnant after receiving a dose, no treatment is needed. In that case, the remaining doses should be delayed until after the pregnancy. Immunization is recommended for any person with an immunocompromised condition through the age of 61 years if she did not get any or all doses earlier. During the  3-dose series, the second dose should be obtained 4-8 weeks after the first dose. The third dose should be obtained 24 weeks after the first dose and 16 weeks after the second dose.  Zoster vaccine. One dose is recommended for adults aged 30 years or older unless certain conditions are present.  Measles, mumps, and rubella (MMR) vaccine. Adults born  before 1957 generally are considered immune to measles and mumps. Adults born in 1957 or later should have 1 or more doses of MMR vaccine unless there is a contraindication to the vaccine or there is laboratory evidence of immunity to each of the three diseases. A routine second dose of MMR vaccine should be obtained at least 28 days after the first dose for students attending postsecondary schools, health care workers, or international travelers. People who received inactivated measles vaccine or an unknown type of measles vaccine during 1963-1967 should receive 2 doses of MMR vaccine. People who received inactivated mumps vaccine or an unknown type of mumps vaccine before 1979 and are at high risk for mumps infection should consider immunization with 2 doses of MMR vaccine. For females of childbearing age, rubella immunity should be determined. If there is no evidence of immunity, females who are not pregnant should be vaccinated. If there is no evidence of immunity, females who are pregnant should delay immunization until after pregnancy. Unvaccinated health care workers born before 1957 who lack laboratory evidence of measles, mumps, or rubella immunity or laboratory confirmation of disease should consider measles and mumps immunization with 2 doses of MMR vaccine or rubella immunization with 1 dose of MMR vaccine.  Pneumococcal 13-valent conjugate (PCV13) vaccine. When indicated, a person who is uncertain of his immunization history and has no record of immunization should receive the PCV13 vaccine. All adults 65 years of age and older should receive this  vaccine. An adult aged 19 years or older who has certain medical conditions and has not been previously immunized should receive 1 dose of PCV13 vaccine. This PCV13 should be followed with a dose of pneumococcal polysaccharide (PPSV23) vaccine. Adults who are at high risk for pneumococcal disease should obtain the PPSV23 vaccine at least 8 weeks after the dose of PCV13 vaccine. Adults older than 79 years of age who have normal immune system function should obtain the PPSV23 vaccine dose at least 1 year after the dose of PCV13 vaccine.  Pneumococcal polysaccharide (PPSV23) vaccine. When PCV13 is also indicated, PCV13 should be obtained first. All adults aged 65 years and older should be immunized. An adult younger than age 65 years who has certain medical conditions should be immunized. Any person who resides in a nursing home or long-term care facility should be immunized. An adult smoker should be immunized. People with an immunocompromised condition and certain other conditions should receive both PCV13 and PPSV23 vaccines. People with human immunodeficiency virus (HIV) infection should be immunized as soon as possible after diagnosis. Immunization during chemotherapy or radiation therapy should be avoided. Routine use of PPSV23 vaccine is not recommended for American Indians, [place] Natives, or people younger than 65 years unless there are medical conditions that require PPSV23 vaccine. When indicated, people who have unknown immunization and have no record of immunization should receive PPSV23 vaccine. One-time revaccination 5 years after the first dose of PPSV23 is recommended for people aged 19-64 years who have chronic kidney failure, nephrotic syndrome, asplenia, or immunocompromised conditions. People who received 1-2 doses of PPSV23 before age 65 years should receive another dose of PPSV23 vaccine at age 65 years or later if at least 5 years have passed since the previous dose. Doses of PPSV23 are not  needed for people immunized with PPSV23 at or after age 65 years.  Meningococcal vaccine. Adults with asplenia or persistent complement component deficiencies should receive 2 doses of quadrivalent meningococcal conjugate (MenACWY-D) vaccine. The doses should be obtained   at least 2 months apart. Microbiologists working with certain meningococcal bacteria, [place] recruits, people at risk during an outbreak, and people who travel to or live in countries with a high rate of meningitis should be immunized. A first-year college student up through age 34 years who is living in a residence hall should receive a dose if she did not receive a dose on or after her 16th birthday. Adults who have certain high-risk conditions should receive one or more doses of vaccine.  Hepatitis A vaccine. Adults who wish to be protected from this disease, have certain high-risk conditions, work with hepatitis A-infected animals, work in hepatitis A research labs, or travel to or work in countries with a high rate of hepatitis A should be immunized. Adults who were previously unvaccinated and who anticipate close contact with an international adoptee during the first 60 days after arrival in the [place] from a country with a high rate of hepatitis A should be immunized.  Hepatitis B vaccine. Adults who wish to be protected from this disease, have certain high-risk conditions, may be exposed to blood or other infectious body fluids, are household contacts or sex partners of hepatitis B positive people, are clients or workers in certain care facilities, or travel to or work in countries with a high rate of hepatitis B should be immunized.  Haemophilus influenzae type b (Hib) vaccine. A previously unvaccinated person with asplenia or sickle cell disease or having a scheduled splenectomy should receive 1 dose of Hib vaccine. Regardless of previous immunization, a recipient of a hematopoietic stem cell transplant should receive a  3-dose series 6-12 months after her successful transplant. Hib vaccine is not recommended for adults with HIV infection. Preventive Services / Frequency Ages 35 to 4 years  Blood pressure check.** / Every 3-5 years.  Lipid and cholesterol check.** / Every 5 years beginning at age 60.  Clinical breast exam.** / Every 3 years for women in their 71s and 10s.  BRCA-related cancer risk assessment.** / For women who have family members with a BRCA-related cancer (breast, ovarian, tubal, or peritoneal cancers).  Pap test.** / Every 2 years from ages 76 through 26. Every 3 years starting at age 61 through age 76 or 93 with a history of 3 consecutive normal Pap tests.  HPV screening.** / Every 3 years from ages 37 through ages 60 to 51 with a history of 3 consecutive normal Pap tests.  Hepatitis C blood test.** / For any individual with known risks for hepatitis C.  Skin self-exam. / Monthly.  Influenza vaccine. / Every year.  Tetanus, diphtheria, and acellular pertussis (Tdap, Td) vaccine.** / Consult your health care provider. Pregnant women should receive 1 dose of Tdap vaccine during each pregnancy. 1 dose of Td every 10 years.  Varicella vaccine.** / Consult your health care provider. Pregnant females who do not have evidence of immunity should receive the first dose after pregnancy.  HPV vaccine. / 3 doses over 6 months, if 93 and younger. The vaccine is not recommended for use in pregnant females. However, pregnancy testing is not needed before receiving a dose.  Measles, mumps, rubella (MMR) vaccine.** / You need at least 1 dose of MMR if you were born in 1957 or later. You may also need a 2nd dose. For females of childbearing age, rubella immunity should be determined. If there is no evidence of immunity, females who are not pregnant should be vaccinated. If there is no evidence of immunity, females who are  pregnant should delay immunization until after pregnancy.  Pneumococcal  13-valent conjugate (PCV13) vaccine.** / Consult your health care provider.  Pneumococcal polysaccharide (PPSV23) vaccine.** / 1 to 2 doses if you smoke cigarettes or if you have certain conditions.  Meningococcal vaccine.** / 1 dose if you are age 68 to 8 years and a Market researcher living in a residence hall, or have one of several medical conditions, you need to get vaccinated against meningococcal disease. You may also need additional booster doses.  Hepatitis A vaccine.** / Consult your health care provider.  Hepatitis B vaccine.** / Consult your health care provider.  Haemophilus influenzae type b (Hib) vaccine.** / Consult your health care provider. Ages 7 to 53 years  Blood pressure check.** / Every year.  Lipid and cholesterol check.** / Every 5 years beginning at age 25 years.  Lung cancer screening. / Every year if you are aged 11-80 years and have a 30-pack-year history of smoking and currently smoke or have quit within the past 15 years. Yearly screening is stopped once you have quit smoking for at least 15 years or develop a health problem that would prevent you from having lung cancer treatment.  Clinical breast exam.** / Every year after age 48 years.  BRCA-related cancer risk assessment.** / For women who have family members with a BRCA-related cancer (breast, ovarian, tubal, or peritoneal cancers).  Mammogram.** / Every year beginning at age 41 years and continuing for as long as you are in good health. Consult with your health care provider.  Pap test.** / Every 3 years starting at age 65 years through age 37 or 70 years with a history of 3 consecutive normal Pap tests.  HPV screening.** / Every 3 years from ages 72 years through ages 60 to 40 years with a history of 3 consecutive normal Pap tests.  Fecal occult blood test (FOBT) of stool. / Every year beginning at age 21 years and continuing until age 5 years. You may not need to do this test if you get  a colonoscopy every 10 years.  Flexible sigmoidoscopy or colonoscopy.** / Every 5 years for a flexible sigmoidoscopy or every 10 years for a colonoscopy beginning at age 35 years and continuing until age 48 years.  Hepatitis C blood test.** / For all people born from [birth year] through 1965 and any individual with known risks for hepatitis C.  Skin self-exam. / Monthly.  Influenza vaccine. / Every year.  Tetanus, diphtheria, and acellular pertussis (Tdap/Td) vaccine.** / Consult your health care provider. Pregnant women should receive 1 dose of Tdap vaccine during each pregnancy. 1 dose of Td every 10 years.  Varicella vaccine.** / Consult your health care provider. Pregnant females who do not have evidence of immunity should receive the first dose after pregnancy.  Zoster vaccine.** / 1 dose for adults aged 30 years or older.  Measles, mumps, rubella (MMR) vaccine.** / You need at least 1 dose of MMR if you were born in 1957 or later. You may also need a second dose. For females of childbearing age, rubella immunity should be determined. If there is no evidence of immunity, females who are not pregnant should be vaccinated. If there is no evidence of immunity, females who are pregnant should delay immunization until after pregnancy.  Pneumococcal 13-valent conjugate (PCV13) vaccine.** / Consult your health care provider.  Pneumococcal polysaccharide (PPSV23) vaccine.** / 1 to 2 doses if you smoke cigarettes or if you have certain conditions.  Meningococcal vaccine.** /  Consult your health care provider.  Hepatitis A vaccine.** / Consult your health care provider.  Hepatitis B vaccine.** / Consult your health care provider.  Haemophilus influenzae type b (Hib) vaccine.** / Consult your health care provider. Ages 64 years and over  Blood pressure check.** / Every year.  Lipid and cholesterol check.** / Every 5 years beginning at age 23 years.  Lung cancer screening. / Every year if you  are aged 16-80 years and have a 30-pack-year history of smoking and currently smoke or have quit within the past 15 years. Yearly screening is stopped once you have quit smoking for at least 15 years or develop a health problem that would prevent you from having lung cancer treatment.  Clinical breast exam.** / Every year after age 74 years.  BRCA-related cancer risk assessment.** / For women who have family members with a BRCA-related cancer (breast, ovarian, tubal, or peritoneal cancers).  Mammogram.** / Every year beginning at age 44 years and continuing for as long as you are in good health. Consult with your health care provider.  Pap test.** / Every 3 years starting at age 58 years through age 22 or 39 years with 3 consecutive normal Pap tests. Testing can be stopped between 65 and 70 years with 3 consecutive normal Pap tests and no abnormal Pap or HPV tests in the past 10 years.  HPV screening.** / Every 3 years from ages 64 years through ages 70 or 61 years with a history of 3 consecutive normal Pap tests. Testing can be stopped between 65 and 70 years with 3 consecutive normal Pap tests and no abnormal Pap or HPV tests in the past 10 years.  Fecal occult blood test (FOBT) of stool. / Every year beginning at age 40 years and continuing until age 27 years. You may not need to do this test if you get a colonoscopy every 10 years.  Flexible sigmoidoscopy or colonoscopy.** / Every 5 years for a flexible sigmoidoscopy or every 10 years for a colonoscopy beginning at age 7 years and continuing until age 32 years.  Hepatitis C blood test.** / For all people born from [birth year] through 1963 and any individual with known risks for hepatitis C.  Osteoporosis screening.** / A one-time screening for women ages 30 years and over and women at risk for fractures or osteoporosis.  Skin self-exam. / Monthly.  Influenza vaccine. / Every year.  Tetanus, diphtheria, and acellular pertussis (Tdap/Td)  vaccine.** / 1 dose of Td every 10 years.  Varicella vaccine.** / Consult your health care provider.  Zoster vaccine.** / 1 dose for adults aged 35 years or older.  Pneumococcal 13-valent conjugate (PCV13) vaccine.** / Consult your health care provider.  Pneumococcal polysaccharide (PPSV23) vaccine.** / 1 dose for all adults aged 46 years and older.  Meningococcal vaccine.** / Consult your health care provider.  Hepatitis A vaccine.** / Consult your health care provider.  Hepatitis B vaccine.** / Consult your health care provider.  Haemophilus influenzae type b (Hib) vaccine.** / Consult your health care provider. ** Family history and personal history of risk and conditions may change your health care provider's recommendations.   This information is not intended to replace advice given to you by your health care provider. Make sure you discuss any questions you have with your health care provider.   Document Released: 04/07/2001 Document Revised: 03/02/2014 Document Reviewed: 07/07/2010 Elsevier Interactive Patient Education [employer].

## 2015-12-23 ENCOUNTER — Other Ambulatory Visit (INDEPENDENT_AMBULATORY_CARE_PROVIDER_SITE_OTHER): Payer: Medicare Other

## 2015-12-23 ENCOUNTER — Encounter: Payer: Self-pay | Admitting: Family Medicine

## 2015-12-23 DIAGNOSIS — E1151 Type 2 diabetes mellitus with diabetic peripheral angiopathy without gangrene: Secondary | ICD-10-CM | POA: Diagnosis not present

## 2015-12-23 DIAGNOSIS — E785 Hyperlipidemia, unspecified: Secondary | ICD-10-CM | POA: Diagnosis not present

## 2015-12-23 DIAGNOSIS — I10 Essential (primary) hypertension: Secondary | ICD-10-CM | POA: Diagnosis not present

## 2015-12-23 DIAGNOSIS — E041 Nontoxic single thyroid nodule: Secondary | ICD-10-CM | POA: Diagnosis not present

## 2015-12-23 LAB — CBC WITH DIFFERENTIAL/PLATELET
BASOS ABS: 0 10*3/uL (ref 0.0–0.1)
BASOS PCT: 0.3 % (ref 0.0–3.0)
EOS ABS: 0.2 10*3/uL (ref 0.0–0.7)
Eosinophils Relative: 3.5 % (ref 0.0–5.0)
HCT: 39.1 % (ref 36.0–46.0)
HEMOGLOBIN: 12.6 g/dL (ref 12.0–15.0)
LYMPHS PCT: 37.6 % (ref 12.0–46.0)
Lymphs Abs: 2.4 10*3/uL (ref 0.7–4.0)
MCHC: 32.2 g/dL (ref 30.0–36.0)
MCV: 78.1 fl (ref 78.0–100.0)
MONO ABS: 0.6 10*3/uL (ref 0.1–1.0)
Monocytes Relative: 9.4 % (ref 3.0–12.0)
Neutro Abs: 3.2 10*3/uL (ref 1.4–7.7)
Neutrophils Relative %: 49.2 % (ref 43.0–77.0)
Platelets: 241 10*3/uL (ref 150.0–400.0)
RBC: 5.01 Mil/uL (ref 3.87–5.11)
RDW: 21 % — AB (ref 11.5–15.5)
WBC: 6.5 10*3/uL (ref 4.0–10.5)

## 2015-12-23 LAB — LIPID PANEL
Cholesterol: 104 mg/dL (ref 0–200)
HDL: 47.7 mg/dL (ref >39.00)
LDL CALC: 37 mg/dL (ref 0–99)
NONHDL: 56.59
Total CHOL/HDL Ratio: 2
Triglycerides: 97 mg/dL (ref 0.0–149.0)
VLDL: 19.4 mg/dL (ref 0.0–40.0)

## 2015-12-23 LAB — POCT URINALYSIS DIPSTICK
BILIRUBIN UA: NEGATIVE
Blood, UA: NEGATIVE
KETONES UA: NEGATIVE
LEUKOCYTES UA: NEGATIVE
NITRITE UA: NEGATIVE
Protein, UA: NEGATIVE
Spec Grav, UA: 1.02
Urobilinogen, UA: 0.2
pH, UA: 6

## 2015-12-23 LAB — HEMOGLOBIN A1C: HEMOGLOBIN A1C: 7.3 % — AB (ref 4.6–6.5)

## 2015-12-23 LAB — TSH: TSH: 4.06 u[IU]/mL (ref 0.35–4.50)

## 2015-12-23 MED ORDER — CANAGLIFLOZIN 100 MG PO TABS: 100.0000 mg | ORAL_TABLET | Freq: Every day | ORAL | 0 refills | Status: DC

## 2015-12-23 MED ORDER — AMLODIPINE BESYLATE 5 MG PO TABS: 5.0000 mg | ORAL_TABLET | Freq: Every day | ORAL | 1 refills | Status: DC

## 2015-12-25 ENCOUNTER — Other Ambulatory Visit: Payer: Self-pay | Admitting: Medical

## 2015-12-26 ENCOUNTER — Encounter: Payer: Self-pay | Admitting: Internal Medicine

## 2015-12-27 ENCOUNTER — Encounter: Payer: Self-pay | Admitting: Internal Medicine

## 2015-12-27 ENCOUNTER — Ambulatory Visit (INDEPENDENT_AMBULATORY_CARE_PROVIDER_SITE_OTHER): Payer: Medicare Other | Admitting: Internal Medicine

## 2015-12-27 VITALS — BP 118/70 | HR 100 | Ht <= 58 in | Wt 131.8 lb

## 2015-12-27 DIAGNOSIS — R0789 Other chest pain: Secondary | ICD-10-CM | POA: Diagnosis not present

## 2015-12-27 DIAGNOSIS — R0602 Shortness of breath: Secondary | ICD-10-CM | POA: Diagnosis not present

## 2015-12-27 NOTE — Progress Notes (Signed)
New Outpatient Visit Date: 12/27/2015  Referring Provider: Ann Held, DO [address] [place], [place] [address]  Chief Complaint: Shortness of breath and chest pain  HPI:  Ms. Derocher is a 81 y.o. year-old female with history of diabetes mellitus, hyperlipidemia, and hypertension, who has been referred by Dr. Carollee Herter for further evaluation of shortness of breath and chest pain. The patient reports that this has been present off and on for several months. Most pronounced symptoms are brief shortness of breath when she bends over. She will take 3 or 4 deep breaths with resolution of the symptoms. She also notes one episode of sharp chest and epigastric pain that lasted a few minutes while she was cooking earlier this year. She had not been exerting herself before this and noted that the pain resolved spontaneously. She was previously able to walk up to 4 miles with only mild exertional dyspnea. However, due to orthopedic issues from several falls over the last year, she has had difficulty exercising on a regular basis.  She denies edema and orthopnea, but typically sleeps on a few pillows due to back pain. She also denies palpitations and lightheadedness. She is concerned that her falls may be related to declining vision or neuropathy. She notes that her blood pressure and diabetes have been under relatively good control, particularly since adjusting her diabetic regimen earlier this year.  Ms. Kemmerer recently underwent transthoracic echocardiogram demonstrating normal LV function but mild pulmonary hypertension with grade 1 diastolic dysfunction. She has noted some difficulty falling asleep at night. Her husband also notes that Ms. [name] often snores at night.  --------------------------------------------------------------------------------------------------  Cardiovascular History & Procedures: Cardiovascular Problems:  Shortness of breath  Chest pain  Risk  Factors:  Diabetes mellitus, hypertension, hyperlipidemia, and age greater than 70  Cath/PCI:  None  CV Surgery:  None  EP Procedures and Devices:  None  Non-Invasive Evaluation(s):  Normal LV size and function (EF 60-65%) with normal wall motion. Grade 1 diastolic dysfunction with elevated filling pressure is noted. Mild degenerative mitral valve disease and mitral and or calcification is noted without significant regurgitation. Pulmonary artery pressure is mildly increased.  Recent CV Pertinent Labs: Lab Results  Component Value Date   CHOL 104 12/23/2015   HDL 47.70 12/23/2015   LDLCALC 37 12/23/2015   TRIG 97.0 12/23/2015   CHOLHDL 2 12/23/2015   K 4.5 12/12/2015   K 4.1 06/02/2011   MG 1.8 06/09/2011   BUN 11 12/12/2015   BUN 13 06/02/2011   CREATININE 0.78 12/12/2015   CREATININE 0.62 06/02/2011    --------------------------------------------------------------------------------------------------  Past Medical History:  Diagnosis Date  . Diabetes mellitus   . Hyperlipidemia   . Hypertension   . Left rib fracture 02/18/2014    Past Surgical History:  Procedure Laterality Date  . APPENDECTOMY    . REFRACTIVE SURGERY     Left Eye  . surgical sterilization  40 years ago    Outpatient Encounter Prescriptions as of 12/27/2015  Medication Sig  . amLODipine (NORVASC) 5 MG tablet Take 1 tablet (5 mg total) by mouth daily.  Marland Kitchen aspirin EC 81 MG tablet Take 81 mg by mouth daily.  Marland Kitchen atorvastatin (LIPITOR) 10 MG tablet Take 1 tablet (10 mg total) by mouth daily.  Marland Kitchen b complex vitamins tablet Take 1 tablet by mouth daily.  . Blood Glucose Monitoring Suppl (ONE TOUCH ULTRA 2) w/Device KIT Use as directed to test  blood sugar two times  daily  . canagliflozin (INVOKANA) 100 MG TABS tablet Take 1 tablet (100 mg total) by mouth daily before breakfast.  . fluticasone (FLONASE) 50 MCG/ACT nasal spray Place 2 sprays into both nostrils daily.  Marland Kitchen gabapentin (NEURONTIN) 300 MG  capsule Take 1 capsule (300 mg total) by mouth 3 (three) times daily.  Marland Kitchen glimepiride (AMARYL) 4 MG tablet Take 1 tablet (4 mg total) by mouth 2 (two) times daily.  Marland Kitchen glucose blood (ONE TOUCH ULTRA TEST) test strip Use to test blood sugar 2 times daily as instructed. Dx code: E11.49  . Insulin Detemir (LEVEMIR FLEXPEN) 100 UNIT/ML Pen Inject 14 Units into the skin at bedtime.  . insulin lispro (HUMALOG KWIKPEN) 100 UNIT/ML KiwkPen Inject into the skin as needed. Inject 4 units at breakfast, 6 units at lunch and 4 units at dinner.  . Insulin Pen Needle 32G X 4 MM MISC Use 3 per day  . levocetirizine (XYZAL) 5 MG tablet TAKE 1 TABLET BY MOUTH  EVERY EVENING  . meloxicam (MOBIC) 15 MG tablet Take 1 tablet (15 mg total) by mouth daily.  . metFORMIN (GLUCOPHAGE) 1000 MG tablet Take 1 tablet (1,000 mg total) by mouth 2 (two) times daily with a meal.  . metoprolol succinate (TOPROL-XL) 100 MG 24 hr tablet Take 1 tablet (100 mg total) by mouth daily. Take with or immediately following a meal.  . mirtazapine (REMERON) 15 MG tablet Take 1 tablet (15 mg total) by mouth at bedtime.  . niacin (NIASPAN) 500 MG CR tablet Take 1,000 mg by mouth at bedtime.  . NONFORMULARY OR COMPOUNDED ITEM Lumbar support belt  #1  As directed  . ONE TOUCH ULTRA TEST test strip TEST TWO TIMES DAILY  . pantoprazole (PROTONIX) 40 MG tablet Take 1 tablet (40 mg total) by mouth daily.  . traMADol (ULTRAM) 50 MG tablet Take 1 tablet (50 mg total) by mouth every 6 (six) hours as needed.  . valsartan (DIOVAN) 160 MG tablet Take 2 tablets (320 mg total) by mouth daily.   No facility-administered encounter medications on file as of 12/27/2015.     Allergies: Ace inhibitors; Losartan; Penicillins; Sulfa antibiotics; and Sulfonamide derivatives  Social History   Social History  . Marital status: Married    Spouse name: Kazaria Gaertner  . Number of children: 2  . Years of education: College   Occupational History  . housewife     Social History Main Topics  . Smoking status: Never Smoker  . Smokeless tobacco: Never Used  . Alcohol use No  . Drug use: No  . Sexual activity: Not Currently   Other Topics Concern  . Not on file   Social History Narrative   Patient lives at home with spouse.   Caffeine Use: rarely    Family History  Problem Relation Age of Onset  . Diabetes Mother   . Hypertension Father   . Depression    . Cancer      laryngeal  . Diabetes      Review of Systems: The patient reports she has trouble falling asleep at night and regularly takes a sleeping aid. Otherwise, a 12-system review of systems was performed and was negative except as noted in the HPI.  --------------------------------------------------------------------------------------------------  Physical Exam: BP 118/70   Pulse 100   Ht 4' 10" (1.473 m)   Wt 131 lb 12.8 oz (59.8 kg)   SpO2 96%   BMI 27.55 kg/m   General:  Well-developed, well-nourished woman seated comfortably in the  exam room. She is accompanied by her husband and a friend (Dr. Irish Lack) HEENT: No conjunctival pallor or scleral icterus.  Moist mucous membranes. Neck: Supple without lymphadenopathy, thyromegaly, JVD, or HJR.  No carotid bruit. Lungs: Normal work of breathing.  Clear to auscultation bilaterally without wheezes or crackles. Heart: Regular rate and rhythm with 1/6 systolic murmur loudest at the left upper sternal border. No rubs or gallops. Abd: Bowel sounds present.  Soft, NT/ND without hepatosplenomegaly Ext: No lower extremity edema.  Radial, PT, and DP pulses are 2+ bilaterally Skin: warm and dry without rash  EKG:  Normal sinus rhythm (HR 92 bpm) without significant abnormalities.  CT chest without contrast (04/21/15): I have personally reviewed the images. Coronary artery calcifications are present. There are also calcifications of the mitral and aortic valve annuli. Bilateral pulmonary ground glass opacities as well as linear  scars noted.  Lab Results  Component Value Date   WBC 6.5 12/23/2015   HGB 12.6 12/23/2015   HCT 39.1 12/23/2015   MCV 78.1 12/23/2015   PLT 241.0 12/23/2015    Lab Results  Component Value Date   NA 140 12/12/2015   K 4.5 12/12/2015   CL 102 12/12/2015   CO2 29 12/12/2015   BUN 11 12/12/2015   CREATININE 0.78 12/12/2015   GLUCOSE 111 (H) 12/12/2015   ALT 28 12/12/2015    Lab Results  Component Value Date   CHOL 104 12/23/2015   HDL 47.70 12/23/2015   LDLCALC 37 12/23/2015   TRIG 97.0 12/23/2015   CHOLHDL 2 12/23/2015   Hemoglobin A1c (12/23/15): 7.3  --------------------------------------------------------------------------------------------------  ASSESSMENT AND PLAN: Shortness of breath This has been an ongoing problem for the patient and is most pronounced when bending over or walking for extended periods. She reports that this has been stable or somewhat improved recently. Echocardiogram was notable for diastolic dysfunction and mild pulmonary hypertension. She appears euvolemic on exam today. We have agreed to continue her current medications, as her blood pressure is well controlled today. Given her coronary calcifications on recent chest CT, dyspnea as anginal equivalent is a possibility. We discussed the risks and benefits of further evaluation with myocardial perfusion stress test. The patient wishes to discuss this with her family and will contact our office if she wishes to proceed. Given that the patient reports some sleep difficulties and was noted to have mild pulmonary hypertension by echo, referral for sleep study would be a consideration as well if her symptoms persist/worsen.  Atypical chest pain The patient's episode of epigastric and chest pain while cooking is atypical. She has not had significant exertional symptoms otherwise. As above, it would be reasonable to perform a noninvasive ischemia evaluation, if the patient wishes to do so. In the meantime,  she should continue with pantoprazole for possible GERD.  Follow-up: Return to clinic as needed.  Nelva Bush, MD 12/27/2015 5:14 PM

## 2015-12-27 NOTE — Patient Instructions (Signed)
Medication Instructions:  Your physician recommends that you continue on your current medications as directed. Please refer to the Current Medication list given to you today.   Labwork: none  Testing/Procedures: None  Follow-Up: Your physician recommends that you schedule a follow-up appointment as needed with Dr End.   Any Other Special Instructions Will Be Listed Below (If Applicable).  Call the office, [phone], and let me know if you decide you want to schedule a stress test ([email or web address] [email or web address]).  Desiree Lucy, RN   If you need a refill on your cardiac medications before your next appointment, please call your pharmacy.

## 2016-01-07 ENCOUNTER — Other Ambulatory Visit: Payer: Self-pay

## 2016-01-07 DIAGNOSIS — E1140 Type 2 diabetes mellitus with diabetic neuropathy, unspecified: Secondary | ICD-10-CM

## 2016-01-07 MED ORDER — TRAMADOL HCL 50 MG PO TABS: 50.0000 mg | ORAL_TABLET | Freq: Four times a day (QID) | ORAL | 1 refills | Status: DC | PRN

## 2016-01-07 NOTE — Progress Notes (Deleted)
Cardiology Office Note    Date:  01/07/2016   ID:  Mary Flynn, DOB 27-Jul-1934, MRN [id number]  PCP:  Ann Held, DO  Cardiologist:  Dr. Saunders Revel  Chief Complaint: Shortness of breath and chest pain   History of Present Illness:   Mary Flynn is a 81 y.o. female with hx of HTN, HLD and DM who presented for f/u on dyspnea and chest pain.      Past Medical History:  Diagnosis Date  . Diabetes mellitus   . Hyperlipidemia   . Hypertension   . Left rib fracture 02/18/2014    Past Surgical History:  Procedure Laterality Date  . APPENDECTOMY    . REFRACTIVE SURGERY     Left Eye  . surgical sterilization  40 years ago    Current Medications: Prior to Admission medications   Medication Sig Start Date End Date Taking? Authorizing Provider  amLODipine (NORVASC) 5 MG tablet Take 1 tablet (5 mg total) by mouth daily. 12/23/15   Rosalita Chessman Chase, DO  aspirin EC 81 MG tablet Take 81 mg by mouth daily.    Historical Provider, MD  atorvastatin (LIPITOR) 10 MG tablet Take 1 tablet (10 mg total) by mouth daily. 12/23/15   Rosalita Chessman Chase, DO  b complex vitamins tablet Take 1 tablet by mouth daily.    Historical Provider, MD  Blood Glucose Monitoring Suppl (ONE TOUCH ULTRA 2) w/Device KIT Use as directed to test  blood sugar two times daily 10/25/15   Elayne Snare, MD  canagliflozin [facility]) 100 MG TABS tablet Take 1 tablet (100 mg total) by mouth daily before breakfast. 12/23/15   Rosalita Chessman Chase, DO  fluticasone (FLONASE) 50 MCG/ACT nasal spray Place 2 sprays into both nostrils daily. 06/28/15   Rosalita Chessman Chase, DO  gabapentin (NEURONTIN) 300 MG capsule Take 1 capsule (300 mg total) by mouth 3 (three) times daily. 12/23/15   Alferd Apa Lowne Chase, DO  glimepiride (AMARYL) 4 MG tablet Take 1 tablet (4 mg total) by mouth 2 (two) times daily. 12/23/15   Rosalita Chessman Chase, DO  glucose blood (ONE TOUCH ULTRA TEST) test strip Use to test blood sugar 2 times daily as  instructed. Dx code: E11.49 04/12/14   Elayne Snare, MD  Insulin Detemir (LEVEMIR FLEXPEN) 100 UNIT/ML Pen Inject 14 Units into the skin at bedtime. 12/23/15   Rosalita Chessman Chase, DO  insulin lispro (HUMALOG KWIKPEN) 100 UNIT/ML KiwkPen Inject into the skin as needed. Inject 4 units at breakfast, 6 units at lunch and 4 units at dinner. 12/23/15   Rosalita Chessman Chase, DO  Insulin Pen Needle 32G X 4 MM MISC Use 3 per day 01/22/15   Elayne Snare, MD  levocetirizine (XYZAL) 5 MG tablet TAKE 1 TABLET BY MOUTH  EVERY EVENING 12/25/15   Alferd Apa Lowne Chase, DO  meloxicam (MOBIC) 15 MG tablet Take 1 tablet (15 mg total) by mouth daily. 03/18/15   Max T Hyatt, DPM  metFORMIN (GLUCOPHAGE) 1000 MG tablet Take 1 tablet (1,000 mg total) by mouth 2 (two) times daily with a meal. 12/23/15   Rosalita Chessman Chase, DO  metoprolol succinate (TOPROL-XL) 100 MG 24 hr tablet Take 1 tablet (100 mg total) by mouth daily. Take with or immediately following a meal. 12/23/15   Rosalita Chessman Chase, DO  mirtazapine (REMERON) 15 MG tablet Take 1 tablet (15 mg total) by mouth at bedtime. 12/23/15  Alferd Apa Lowne Chase, DO  niacin (NIASPAN) 500 MG CR tablet Take 1,000 mg by mouth at bedtime.    Historical Provider, MD  NONFORMULARY OR COMPOUNDED ITEM Lumbar support belt  #1  As directed 12/19/15   Rosalita Chessman Chase, DO  ONE TOUCH ULTRA TEST test strip TEST TWO TIMES DAILY 12/13/15   Elayne Snare, MD  pantoprazole (PROTONIX) 40 MG tablet Take 1 tablet (40 mg total) by mouth daily. 12/23/15   Rosalita Chessman Chase, DO  traMADol (ULTRAM) 50 MG tablet Take 1 tablet (50 mg total) by mouth every 6 (six) hours as needed. 12/23/15   Alferd Apa Lowne Chase, DO  valsartan (DIOVAN) 160 MG tablet Take 2 tablets (320 mg total) by mouth daily. 12/23/15   Rosalita Chessman Chase, DO    Allergies:   Ace inhibitors; Losartan; Penicillins; Sulfa antibiotics; and Sulfonamide derivatives   Social History   Social History  . Marital status: Married     Spouse name: [name]  . Number of children: 2  . Years of education: College   Occupational History  . housewife    Social History Main Topics  . Smoking status: Never Smoker  . Smokeless tobacco: Never Used  . Alcohol use No  . Drug use: No  . Sexual activity: Not Currently   Other Topics Concern  . Not on file   Social History Narrative   Patient lives at home with spouse.   Caffeine Use: rarely     Family History:  The patient's family history includes Diabetes in her mother; Hypertension in her father. ***  ROS:   Please see the history of present illness.    ROS All other systems reviewed and are negative.   PHYSICAL EXAM:   VS:  There were no vitals taken for this visit.   GEN: Well nourished, well developed, in no acute distress  HEENT: normal  Neck: no JVD, carotid bruits, or masses Cardiac: ***RRR; no murmurs, rubs, or gallops,no edema  Respiratory:  clear to auscultation bilaterally, normal work of breathing GI: soft, nontender, nondistended, + BS MS: no deformity or atrophy  Skin: warm and dry, no rash Neuro:  Alert and Oriented x 3, Strength and sensation are intact Psych: euthymic mood, full affect  Wt Readings from Last 3 Encounters:  12/27/15 131 lb 12.8 oz (59.8 kg)  12/19/15 132 lb (59.9 kg)  12/17/15 130 lb 9.6 oz (59.2 kg)      Studies/Labs Reviewed:   EKG:  EKG is ordered today.  The ekg ordered today demonstrates ***  Recent Labs: 12/12/2015: ALT 28; BUN 11; Creatinine, Ser 0.78; Potassium 4.5; Sodium 140 12/23/2015: Hemoglobin 12.6; Platelets 241.0; TSH 4.06   Lipid Panel    Component Value Date/Time   CHOL 104 12/23/2015 0810   TRIG 97.0 12/23/2015 0810   HDL 47.70 12/23/2015 0810   CHOLHDL 2 12/23/2015 0810   VLDL 19.4 12/23/2015 0810   LDLCALC 37 12/23/2015 0810    Additional studies/ records that were reviewed today include:   Echocardiogram:  Cardiac Catheterization:     ASSESSMENT & PLAN:     1. ***    Medication Adjustments/Labs and Tests Ordered: Current medicines are reviewed at length with the patient today.  Concerns regarding medicines are outlined above.  Medication changes, Labs and Tests ordered today are listed in the Patient Instructions below. There are no Patient Instructions on file for this visit.   Jarrett Soho, PA  01/07/2016 1:49 PM  [facility] [place], [place], [place]  [address] Phone: [phone]; Fax: [phone]

## 2016-01-07 NOTE — Telephone Encounter (Signed)
Last seen 12/19/15 Last filled #180-1 rf Sig Take 1 tablet (50 mg total) by mouth every 6 (six) hours as needed. Please advise PC

## 2016-01-08 ENCOUNTER — Ambulatory Visit: Payer: Self-pay | Admitting: Physician Assistant

## 2016-01-09 ENCOUNTER — Other Ambulatory Visit: Payer: Self-pay | Admitting: Family Medicine

## 2016-01-09 ENCOUNTER — Ambulatory Visit: Payer: Self-pay | Admitting: Internal Medicine

## 2016-01-09 ENCOUNTER — Telehealth: Payer: Self-pay | Admitting: *Deleted

## 2016-01-09 MED ORDER — TRAMADOL HCL 50 MG PO TABS: 50.0000 mg | ORAL_TABLET | Freq: Four times a day (QID) | ORAL | 1 refills | Status: DC | PRN

## 2016-01-09 NOTE — Addendum Note (Signed)
Addended by: Magdalene Molly A on: 01/09/2016 02:12 PM   Modules accepted: Orders

## 2016-01-09 NOTE — Telephone Encounter (Signed)
Cancel last rx

## 2016-01-09 NOTE — Telephone Encounter (Signed)
"  I would like to speak to you as soon as possible.  Mary Flynn is not feeling well.  She has very bad foot pain.  She is Diabetic.  Please call me when you see this message."

## 2016-01-09 NOTE — Telephone Encounter (Signed)
Called pt and let her know that her meds has been [facility]

## 2016-01-09 NOTE — Telephone Encounter (Signed)
Patient checking on the status of traMADol (ULTRAM) 50 MG tablet request, patient states she's in pain, please advise

## 2016-01-09 NOTE — Telephone Encounter (Signed)
Caller name:Ben Relationship to patient: Can be reached:[facility]  Reason for call:Tramdol has been ordered twice in the last 2 days. Please advise

## 2016-01-10 NOTE — Telephone Encounter (Signed)
I am returning Mr. Hightower's call.  How can I help you?  "I have a very bad pain in my foot yesterday.  I could hardly walk it hurt so bad.  I tried to schedule an appointment but they said he is booked up."  Yes, his schedule is full.  "Okay Kippy Gohman, thank you so much for calling me back.  It feels better today."

## 2016-01-13 NOTE — Telephone Encounter (Addendum)
Relation to [id number] Call back [phone]   Reason for call:  Patient states the cost will be cheaper if 3 month supply is prescribed, please send to [facility], [place] [phone] (Phone) [phone] (Fax)

## 2016-01-13 NOTE — Telephone Encounter (Signed)
Did she fill it yet?  She can send the one she has---- or we can wait a few weeks and sent refill to [facility].

## 2016-01-13 NOTE — Telephone Encounter (Signed)
Last ov 12/19/15. Last fill 01/09/16 #180 1.Patient states the cost will be cheaper if 3 month supply is prescribed through [facility]. Please advise.

## 2016-01-14 NOTE — Telephone Encounter (Signed)
It was printed 2x in last few weeks--- were they not faxed to pharmacy?   If not ok to reprint and send to [facility]

## 2016-01-14 NOTE — Telephone Encounter (Addendum)
Original rx sent to [facility]. Rx canceled at [facility]. Discussed w/ Rip Harbour, LPN and rx has not been sent to mail order, so I faxed rx to [facility]. Fax confirmation received.   Mirtazapine was sent to mail order 11/15/15, #90, 1 RF. She should not need refill on this.

## 2016-01-14 NOTE — Telephone Encounter (Signed)
Pt says she has not had tramadol for a while now and she is in need of the medication and she also need Rx mirtazapine, Last filled 12/23/15 #90 1. Pt states provider usually give both for her pain. Please advise.

## 2016-01-15 ENCOUNTER — Telehealth: Payer: Self-pay | Admitting: Family Medicine

## 2016-01-15 ENCOUNTER — Encounter: Payer: Self-pay | Admitting: Family Medicine

## 2016-01-15 ENCOUNTER — Ambulatory Visit (INDEPENDENT_AMBULATORY_CARE_PROVIDER_SITE_OTHER): Payer: Medicare Other | Admitting: Family Medicine

## 2016-01-15 VITALS — BP 133/54 | HR 65 | Temp 97.8°F | Ht <= 58 in | Wt 129.4 lb

## 2016-01-15 DIAGNOSIS — E1140 Type 2 diabetes mellitus with diabetic neuropathy, unspecified: Secondary | ICD-10-CM

## 2016-01-15 DIAGNOSIS — G5603 Carpal tunnel syndrome, bilateral upper limbs: Secondary | ICD-10-CM

## 2016-01-15 DIAGNOSIS — G4700 Insomnia, unspecified: Secondary | ICD-10-CM | POA: Diagnosis not present

## 2016-01-15 MED ORDER — MIRTAZAPINE 15 MG PO TABS: 15.0000 mg | ORAL_TABLET | Freq: Every day | ORAL | 1 refills | Status: DC

## 2016-01-15 MED ORDER — TRAMADOL HCL 50 MG PO TABS: 50.0000 mg | ORAL_TABLET | Freq: Four times a day (QID) | ORAL | 1 refills | Status: DC | PRN

## 2016-01-15 NOTE — Telephone Encounter (Signed)
Spoke with Dr, Nani Ravens he suggested pt comes in the office to be further evaluated. Pt stated she will come in toda. Pt has appt today at 4:30pm with Dr. Nani Ravens.  PC

## 2016-01-15 NOTE — Patient Instructions (Signed)
Start doing yoga.  Keep active.

## 2016-01-15 NOTE — Progress Notes (Signed)
Pre visit review using our clinic review tool, if applicable. No additional management support is needed unless otherwise documented below in the visit note. 

## 2016-01-15 NOTE — Progress Notes (Signed)
Chief Complaint  Mary Flynn presents with  . Hand Pain    Arthritis pain and numbness in hands.    Subjective: Mary Flynn is a 81 y.o. female here for f/u numbness and tingling in hands.  Hx of CTS, used to have wrist cock up splints, but she has worn out her gloves. She would like a rx for a new splint and has a particular rx in mind. Symptoms include numbness, tingling and weakness, worse at night. That is when she would wear the splints.  She is also requesting refills.   ROS: Neuro: As noted in HPI  Family History  Problem Relation Age of Onset  . Diabetes Mother   . Hypertension Father   . Depression    . Cancer      laryngeal  . Diabetes     Past Medical History:  Diagnosis Date  . Diabetes mellitus   . Hyperlipidemia   . Hypertension   . Left rib fracture 02/18/2014   Allergies  Allergen Reactions  . Ace Inhibitors Cough  . Losartan Cough  . Penicillins Hives  . Sulfa Antibiotics Hives  . Sulfonamide Derivatives     Current Outpatient Prescriptions:  .  amLODipine (NORVASC) 5 MG tablet, Take 1 tablet (5 mg total) by mouth daily., Disp: 90 tablet, Rfl: 1 .  aspirin EC 81 MG tablet, Take 81 mg by mouth daily., Disp: , Rfl:  .  atorvastatin (LIPITOR) 10 MG tablet, Take 1 tablet (10 mg total) by mouth daily., Disp: 90 tablet, Rfl: 1 .  b complex vitamins tablet, Take 1 tablet by mouth daily., Disp: , Rfl:  .  Blood Glucose Monitoring Suppl (ONE TOUCH ULTRA 2) w/Device KIT, Use as directed to test  blood sugar two times daily, Disp: 1 each, Rfl: 1 .  canagliflozin (INVOKANA) 100 MG TABS tablet, Take 1 tablet (100 mg total) by mouth daily before breakfast., Disp: 30 tablet, Rfl: 0 .  fluticasone (FLONASE) 50 MCG/ACT nasal spray, Place 2 sprays into both nostrils daily., Disp: 16 g, Rfl: 1 .  gabapentin (NEURONTIN) 300 MG capsule, Take 1 capsule (300 mg total) by mouth 3 (three) times daily., Disp: 270 capsule, Rfl: 1 .  glimepiride (AMARYL) 4 MG tablet, Take 1 tablet (4  mg total) by mouth 2 (two) times daily., Disp: 180 tablet, Rfl: 0 .  glucose blood (ONE TOUCH ULTRA TEST) test strip, Use to test blood sugar 2 times daily as instructed. Dx code: E11.49, Disp: 200 each, Rfl: 3 .  Insulin Detemir (LEVEMIR FLEXPEN) 100 UNIT/ML Pen, Inject 14 Units into the skin at bedtime., Disp: 15 mL, Rfl: 3 .  insulin lispro (HUMALOG KWIKPEN) 100 UNIT/ML KiwkPen, Inject into the skin as needed. Inject 4 units at breakfast, 6 units at lunch and 4 units at dinner., Disp: , Rfl:  .  Insulin Pen Needle 32G X 4 MM MISC, Use 3 per day, Disp: 100 each, Rfl: 3 .  levocetirizine (XYZAL) 5 MG tablet, TAKE 1 TABLET BY MOUTH  EVERY EVENING, Disp: 90 tablet, Rfl: 1 .  meloxicam (MOBIC) 15 MG tablet, Take 1 tablet (15 mg total) by mouth daily., Disp: 30 tablet, Rfl: 8 .  metFORMIN (GLUCOPHAGE) 1000 MG tablet, Take 1 tablet (1,000 mg total) by mouth 2 (two) times daily with a meal., Disp: 180 tablet, Rfl: 1 .  metoprolol succinate (TOPROL-XL) 100 MG 24 hr tablet, Take 1 tablet (100 mg total) by mouth daily. Take with or immediately following a meal., Disp: 90 tablet, Rfl: 3 .  mirtazapine (REMERON) 15 MG tablet, Take 1 tablet (15 mg total) by mouth at bedtime., Disp: 90 tablet, Rfl: 1 .  niacin (NIASPAN) 500 MG CR tablet, Take 1,000 mg by mouth at bedtime., Disp: , Rfl:  .  NONFORMULARY OR COMPOUNDED ITEM, Lumbar support belt  #1  As directed, Disp: 1 each, Rfl: 0 .  ONE TOUCH ULTRA TEST test strip, TEST TWO TIMES DAILY, Disp: 200 each, Rfl: 1 .  pantoprazole (PROTONIX) 40 MG tablet, Take 1 tablet (40 mg total) by mouth daily., Disp: 90 tablet, Rfl: 1 .  traMADol (ULTRAM) 50 MG tablet, Take 1 tablet (50 mg total) by mouth every 6 (six) hours as needed., Disp: 180 tablet, Rfl: 1 .  valsartan (DIOVAN) 160 MG tablet, Take 2 tablets (320 mg total) by mouth daily., Disp: 180 tablet, Rfl: 3  Objective: BP (!) 133/54 (BP Location: Right Arm, Mary Flynn Position: Sitting, Cuff Size: Normal)   Pulse 65    Temp 97.8 F (36.6 C) (Oral)   Ht '4\' 10"'$  (1.473 m)   Wt 129 lb 6.4 oz (58.7 kg)   SpO2 100% Comment: RA  BMI 27.04 kg/m  General: Awake, appears stated age Heart: RRR, no murmurs, brisk cap refill Lungs: CTAB, no rales, wheezes or rhonchi. No accessory muscle use MSK: No atrophy, grip strength adequate Neuro: +tinel's sign b/l, sensation intact to light touch b/l Psych: Age appropriate judgment and insight, normal affect and mood  Assessment and Plan: Bilateral carpal tunnel syndrome  Insomnia, unspecified type - Plan: mirtazapine (REMERON) 15 MG tablet  Controlled type 2 diabetes mellitus with diabetic neuropathy, without long-term current use of insulin ([place]) - Plan: traMADol (ULTRAM) 50 MG tablet  Orders as above. Rx for Isotoner Therapy gloves given for R and L hand/wrist. Keep doing yoga and moving for fibromyalgia.  F/u as originally planned with Dr. Carollee Herter. The Mary Flynn voiced understanding and agreement to the plan.  [place], DO 01/15/16  4:39 PM

## 2016-01-15 NOTE — Telephone Encounter (Signed)
Caller name: Latisia Relation to pt: self Call back number: [phone] Pharmacy:   Reason for call: Pt States is having some issue with arthritis on her hands (some numbness) and wanted to know if she can get a prescription for this issue, pt states she would like a printed out prescription so she can take it to the pharmacy herself. Please advise.

## 2016-01-21 ENCOUNTER — Encounter: Payer: Self-pay | Admitting: Family Medicine

## 2016-01-21 ENCOUNTER — Telehealth: Payer: Self-pay | Admitting: Family Medicine

## 2016-01-21 DIAGNOSIS — G5603 Carpal tunnel syndrome, bilateral upper limbs: Secondary | ICD-10-CM

## 2016-01-21 NOTE — Telephone Encounter (Signed)
error:[id number] ° °

## 2016-01-21 NOTE — Telephone Encounter (Signed)
°  Relation to [id number] Call back [phone]   Reason for call:  Patient states she needs a copy of Rx for therapy gloves due to insurance reimbursement, patient states Dr. Nani Ravens wrote the Rx, please advise

## 2016-01-22 ENCOUNTER — Telehealth: Payer: Self-pay

## 2016-01-22 NOTE — Telephone Encounter (Signed)
error 

## 2016-01-22 NOTE — Telephone Encounter (Signed)
I have spoken to patient who informed me she lost original copy of Rx for Isotoner therapy gloves. Pt asks for another Rx to be written with 2 copies for insurance. TL/CMA

## 2016-01-22 NOTE — Telephone Encounter (Signed)
I have informed patient Rx is ready for pick up in front office. TL/CMA

## 2016-01-23 ENCOUNTER — Telehealth: Payer: Self-pay | Admitting: Family Medicine

## 2016-01-23 NOTE — Telephone Encounter (Signed)
°  Relation to [id number] Call back [place]:  Reason for call: pt states she is needing a prior authorization for lumbar support belk, and for therapy glove her arthiritis. Pt has [employer].

## 2016-01-27 NOTE — Telephone Encounter (Signed)
Per provider pt have to be seen for problem of lumbar and arthritis of hands in the office, in order to go forward with Prior Authorizations. Informed pt. LB.

## 2016-01-28 ENCOUNTER — Encounter: Payer: Self-pay | Admitting: Family Medicine

## 2016-01-28 ENCOUNTER — Ambulatory Visit (INDEPENDENT_AMBULATORY_CARE_PROVIDER_SITE_OTHER): Payer: Medicare Other | Admitting: Family Medicine

## 2016-01-28 VITALS — BP 137/60 | HR 79 | Temp 98.8°F | Resp 16 | Ht <= 58 in | Wt 126.8 lb

## 2016-01-28 DIAGNOSIS — J069 Acute upper respiratory infection, unspecified: Secondary | ICD-10-CM

## 2016-01-28 DIAGNOSIS — R059 Cough, unspecified: Secondary | ICD-10-CM

## 2016-01-28 DIAGNOSIS — M549 Dorsalgia, unspecified: Secondary | ICD-10-CM

## 2016-01-28 DIAGNOSIS — R05 Cough: Secondary | ICD-10-CM | POA: Diagnosis not present

## 2016-01-28 MED ORDER — PROMETHAZINE-DM 6.25-15 MG/5ML PO SYRP: 5.0000 mL | ORAL_SOLUTION | Freq: Four times a day (QID) | ORAL | 0 refills | Status: DC | PRN

## 2016-01-28 MED ORDER — IPRATROPIUM BROMIDE 0.03 % NA SOLN: 2.0000 | Freq: Two times a day (BID) | NASAL | 12 refills | Status: DC

## 2016-01-28 MED ORDER — FLUTICASONE PROPIONATE 50 MCG/ACT NA SUSP: 2.0000 | Freq: Every day | NASAL | 1 refills | Status: DC

## 2016-01-28 NOTE — Progress Notes (Signed)
Subjective:     Mary Flynn is a 81 y.o. female here for evaluation of a cough. Onset of symptoms was 2 days ago. Symptoms have been gradually worsening since that time. The cough is dry and is aggravated by reclining position. Associated symptoms include: chills and postnasal drip. Patient does not have a history of asthma. Patient does not have a history of environmental allergens. Patient has not traveled recently. Patient does not have a history of smoking. Patient has not had a previous chest x-ray. Patient has not had a PPD done.  The following portions of the patient's history were reviewed and updated as appropriate: She  has a past medical history of Diabetes mellitus; Hyperlipidemia; Hypertension; and Left rib fracture (02/18/2014). She  does not have any pertinent problems on file. She  has a past surgical history that includes Appendectomy; surgical sterilization (40 years ago); and Refractive surgery. Her family history includes Diabetes in her mother; Hypertension in her father. She  reports that she has never smoked. She has never used smokeless tobacco. She reports that she does not drink alcohol or use drugs. She has a current medication list which includes the following prescription(s): amlodipine, aspirin ec, atorvastatin, b complex vitamins, one touch ultra 2, canagliflozin, gabapentin, glimepiride, glucose blood, insulin detemir, insulin lispro, insulin pen needle, levocetirizine, meloxicam, metformin, metoprolol succinate, mirtazapine, niacin, NONFORMULARY OR COMPOUNDED ITEM, one touch ultra test, pantoprazole, tramadol, valsartan, ipratropium, and promethazine-dextromethorphan. Current Outpatient Prescriptions on File Prior to Visit  Medication Sig Dispense Refill  . amLODipine (NORVASC) 5 MG tablet Take 1 tablet (5 mg total) by mouth daily. 90 tablet 1  . aspirin EC 81 MG tablet Take 81 mg by mouth daily.    Marland Kitchen atorvastatin (LIPITOR) 10 MG tablet Take 1 tablet (10 mg total) by  mouth daily. 90 tablet 1  . b complex vitamins tablet Take 1 tablet by mouth daily.    . Blood Glucose Monitoring Suppl (ONE TOUCH ULTRA 2) w/Device KIT Use as directed to test  blood sugar two times daily 1 each 1  . canagliflozin (INVOKANA) 100 MG TABS tablet Take 1 tablet (100 mg total) by mouth daily before breakfast. 30 tablet 0  . gabapentin (NEURONTIN) 300 MG capsule Take 1 capsule (300 mg total) by mouth 3 (three) times daily. 270 capsule 1  . glimepiride (AMARYL) 4 MG tablet Take 1 tablet (4 mg total) by mouth 2 (two) times daily. 180 tablet 0  . glucose blood (ONE TOUCH ULTRA TEST) test strip Use to test blood sugar 2 times daily as instructed. Dx code: E11.49 200 each 3  . Insulin Detemir (LEVEMIR FLEXPEN) 100 UNIT/ML Pen Inject 14 Units into the skin at bedtime. 15 mL 3  . insulin lispro (HUMALOG KWIKPEN) 100 UNIT/ML KiwkPen Inject into the skin as needed. Inject 4 units at breakfast, 6 units at lunch and 4 units at dinner.    . Insulin Pen Needle 32G X 4 MM MISC Use 3 per day 100 each 3  . levocetirizine (XYZAL) 5 MG tablet TAKE 1 TABLET BY MOUTH  EVERY EVENING 90 tablet 1  . meloxicam (MOBIC) 15 MG tablet Take 1 tablet (15 mg total) by mouth daily. 30 tablet 8  . metFORMIN (GLUCOPHAGE) 1000 MG tablet Take 1 tablet (1,000 mg total) by mouth 2 (two) times daily with a meal. 180 tablet 1  . metoprolol succinate (TOPROL-XL) 100 MG 24 hr tablet Take 1 tablet (100 mg total) by mouth daily. Take with or immediately following  a meal. 90 tablet 3  . mirtazapine (REMERON) 15 MG tablet Take 1 tablet (15 mg total) by mouth at bedtime. 90 tablet 1  . niacin (NIASPAN) 500 MG CR tablet Take 1,000 mg by mouth at bedtime.    . NONFORMULARY OR COMPOUNDED ITEM Lumbar support belt  #1  As directed 1 each 0  . ONE TOUCH ULTRA TEST test strip TEST TWO TIMES DAILY 200 each 1  . pantoprazole (PROTONIX) 40 MG tablet Take 1 tablet (40 mg total) by mouth daily. 90 tablet 1  . traMADol (ULTRAM) 50 MG tablet  Take 1 tablet (50 mg total) by mouth every 6 (six) hours as needed. 180 tablet 1  . valsartan (DIOVAN) 160 MG tablet Take 2 tablets (320 mg total) by mouth daily. 180 tablet 3   No current facility-administered medications on file prior to visit.    She is allergic to ace inhibitors; losartan; penicillins; sulfa antibiotics; and sulfonamide derivatives..  Review of Systems Pertinent items are noted in HPI.    Objective:    Oxygen saturation 99% on room air BP 137/60   Pulse 79   Temp 98.8 F (37.1 C) (Oral)   Resp 16   Ht _0  (1.473 m)   Wt 126 lb 12.8 oz (57.5 kg)   SpO2 99%   BMI 26.50 kg/m  General appearance: alert, cooperative, appears stated age and no distress Ears: abnormal TM right ear - could not see Nose: clear discharge, moderate congestion, turbinates red, swollen, sinus tenderness bilateral Throat: abnormal findings: mild oropharyngeal erythema Neck: moderate anterior cervical adenopathy, supple, symmetrical, trachea midline and thyroid not enlarged, symmetric, no tenderness/mass/nodules Lungs: clear to auscultation bilaterally Heart: S1, S2 normal    Assessment:    URI with Post Nasal Drip    Plan:    Explained lack of efficacy of antibiotics in viral disease. Antitussives per medication orders. Avoid exposure to tobacco smoke and fumes. Call if shortness of breath worsens, blood in sputum, change in character of cough, development of fever or chills, inability to maintain nutrition and hydration. Avoid exposure to tobacco smoke and fumes. Trial of antihistamines. Trial of steroid nasal spray.    1. Acute upper respiratory infection See above - ipratropium (ATROVENT) 0.03 % nasal spray; Place 2 sprays into both nostrils every 12 (twelve) hours.  Dispense: 30 mL; Refill: 12 - promethazine-dextromethorphan (PROMETHAZINE-DM) 6.25-15 MG/5ML syrup; Take 5 mLs by mouth 4 (four) times daily as needed.  Dispense: 118 mL; Refill: 0  2. Cough  -  promethazine-dextromethorphan (PROMETHAZINE-DM) 6.25-15 MG/5ML syrup; Take 5 mLs by mouth 4 (four) times daily as needed.  Dispense: 118 mL; Refill: 0  3. Mid back pain on left side

## 2016-01-28 NOTE — Progress Notes (Signed)
Pre visit review using our clinic review tool, if applicable. No additional management support is needed unless otherwise documented below in the visit note. 

## 2016-01-28 NOTE — Patient Instructions (Signed)
Upper Respiratory Infection, Adult Most upper respiratory infections (URIs) are caused by a virus. A URI affects the nose, throat, and upper air passages. The most common type of URI is often called "the common cold." Follow these instructions at home:  Take medicines only as told by your doctor.  Gargle warm saltwater or take cough drops to comfort your throat as told by your doctor.  Use a warm mist humidifier or inhale steam from a shower to increase air moisture. This may make it easier to breathe.  Drink enough fluid to keep your pee (urine) clear or pale yellow.  Eat soups and other clear broths.  Have a healthy diet.  Rest as needed.  Go back to work when your fever is gone or your doctor says it is okay.  You may need to stay home longer to avoid giving your URI to others.  You can also wear a face mask and wash your hands often to prevent spread of the virus.  Use your inhaler more if you have asthma.  Do not use any tobacco products, including cigarettes, chewing tobacco, or electronic cigarettes. If you need help quitting, ask your doctor. Contact a doctor if:  You are getting worse, not better.  Your symptoms are not helped by medicine.  You have chills.  You are getting more short of breath.  You have brown or red mucus.  You have yellow or brown discharge from your nose.  You have pain in your face, especially when you bend forward.  You have a fever.  You have puffy (swollen) neck glands.  You have pain while swallowing.  You have white areas in the back of your throat. Get help right away if:  You have very bad or constant:  Headache.  Ear pain.  Pain in your forehead, behind your eyes, and over your cheekbones (sinus pain).  Chest pain.  You have long-lasting (chronic) lung disease and any of the following:  Wheezing.  Long-lasting cough.  Coughing up blood.  A change in your usual mucus.  You have a stiff neck.  You have  changes in your:  Vision.  Hearing.  Thinking.  Mood. This information is not intended to replace advice given to you by your health care provider. Make sure you discuss any questions you have with your health care provider. Document Released: 07/29/2007 Document Revised: 10/13/2015 Document Reviewed: 05/17/2013 Elsevier Interactive Patient Education  2017 Elsevier Inc.  

## 2016-01-28 NOTE — Assessment & Plan Note (Signed)
With arm and L leg pain Pt has seen neurosurgery--- we need ov notes

## 2016-01-29 ENCOUNTER — Encounter: Payer: Self-pay | Admitting: *Deleted

## 2016-01-29 MED FILL — FLUTICASONE PROP 50 MCG SPR: 50 | 30 days supply | Qty: 16 | Fill #0

## 2016-01-29 MED FILL — IPRATROPIUM 0.03% SPRAY: 0.03 | 44 days supply | Qty: 30 | Fill #0

## 2016-01-29 NOTE — Telephone Encounter (Signed)
Patient came into office 01/28/16 and asked if we could do an prior auth for her lumbar support and therapy gloves.  I called insurance and they stated that since the supply stores do not bill insurance that she cannot be reimbursed and that there is nothing that we can do to provide to get this covered for her.  Also accupuncture is not covered as well.    Tried calling patient on both numbers.  Home number could not be completed as dialed and cell number went straight to message stating no vm set up.  Letter sent to patient home detailing notes.

## 2016-01-29 NOTE — Telephone Encounter (Signed)
Pt had an appointment 01/28/16, seen by provider. LB

## 2016-01-30 DIAGNOSIS — H40011 Open angle with borderline findings, low risk, right eye: Secondary | ICD-10-CM | POA: Diagnosis not present

## 2016-01-30 DIAGNOSIS — Z961 Presence of intraocular lens: Secondary | ICD-10-CM | POA: Diagnosis not present

## 2016-01-30 DIAGNOSIS — H401123 Primary open-angle glaucoma, left eye, severe stage: Secondary | ICD-10-CM | POA: Diagnosis not present

## 2016-01-30 DIAGNOSIS — H348322 Tributary (branch) retinal vein occlusion, left eye, stable: Secondary | ICD-10-CM | POA: Diagnosis not present

## 2016-01-30 LAB — HM DIABETES EYE EXAM

## 2016-01-31 ENCOUNTER — Other Ambulatory Visit: Payer: Self-pay

## 2016-01-31 ENCOUNTER — Telehealth: Payer: Self-pay | Admitting: Endocrinology

## 2016-01-31 DIAGNOSIS — E1140 Type 2 diabetes mellitus with diabetic neuropathy, unspecified: Secondary | ICD-10-CM

## 2016-01-31 MED ORDER — CANAGLIFLOZIN 100 MG PO TABS: 100.0000 mg | ORAL_TABLET | Freq: Every day | ORAL | 1 refills | Status: DC

## 2016-01-31 NOTE — Telephone Encounter (Signed)
Spoke with pharmacy issue resolved

## 2016-01-31 NOTE — Telephone Encounter (Signed)
Pt went to [facility] to pick up her Invokana script with her coupon card she got yesterday and [facility] said they never received a prescription from [place], she requests this be sent as soon as possible.

## 2016-02-04 ENCOUNTER — Telehealth: Payer: Self-pay | Admitting: Endocrinology

## 2016-02-04 NOTE — Telephone Encounter (Signed)
invokana needs to be sent in for a 3 month supply to be called to [facility]

## 2016-02-04 NOTE — Telephone Encounter (Signed)
#   for the invokana 100 mg is [phone]

## 2016-02-05 ENCOUNTER — Other Ambulatory Visit: Payer: Self-pay

## 2016-02-05 DIAGNOSIS — E1140 Type 2 diabetes mellitus with diabetic neuropathy, unspecified: Secondary | ICD-10-CM

## 2016-02-05 MED ORDER — CANAGLIFLOZIN 100 MG PO TABS: 100.0000 mg | ORAL_TABLET | Freq: Every day | ORAL | 2 refills | Status: DC

## 2016-02-05 NOTE — Telephone Encounter (Signed)
Ordered 02/05/16 the number given in this note is a fax number for [facility] so I e-scribed instead

## 2016-02-06 ENCOUNTER — Telehealth: Payer: Self-pay | Admitting: Endocrinology

## 2016-02-06 NOTE — Telephone Encounter (Signed)
invokana needs to be faxed to them at the number provided please

## 2016-02-07 ENCOUNTER — Other Ambulatory Visit: Payer: Self-pay

## 2016-02-07 DIAGNOSIS — E1140 Type 2 diabetes mellitus with diabetic neuropathy, unspecified: Secondary | ICD-10-CM

## 2016-02-07 MED ORDER — CANAGLIFLOZIN 100 MG PO TABS: 100.0000 mg | ORAL_TABLET | Freq: Every day | ORAL | 2 refills | Status: DC

## 2016-02-07 NOTE — Telephone Encounter (Signed)
The number in the 12/12 encounter

## 2016-02-07 NOTE — Telephone Encounter (Signed)
Faxed 02/07/16

## 2016-02-13 ENCOUNTER — Telehealth: Payer: Self-pay | Admitting: Family Medicine

## 2016-02-13 MED ORDER — CIPROFLOXACIN HCL 500 MG PO TABS: 500.0000 mg | ORAL_TABLET | Freq: Two times a day (BID) | ORAL | 0 refills | Status: DC

## 2016-02-13 MED ORDER — MEFLOQUINE HCL 250 MG PO TABS: 250.0000 mg | ORAL_TABLET | ORAL | 0 refills | Status: DC

## 2016-02-13 NOTE — Addendum Note (Signed)
Addended by: Sharon Seller B on: 02/13/2016 02:36 PM   Modules accepted: Orders

## 2016-02-13 NOTE — Telephone Encounter (Signed)
OK to rx ciprofloxacin 500 mg po bid x 10 days. Can take only 5 days for UTI and 10 days for a respiratory infection. Also OK to rx Mefloquine 250 mg tab, 1 tab po q week. Disp #10

## 2016-02-13 NOTE — Telephone Encounter (Signed)
Pt states she is going to [place] in January and needs to have RX for Melfoquin 200mg . She has to take 1-2/week for approx 7 weeks and needs RX for 10 tablets. She mentioned to prevent sickness from the water. Pt also states that she needs RX for Cipro in case she gets a UTI while she is away. Please advise. LB

## 2016-02-13 NOTE — Telephone Encounter (Signed)
Caller name: Sheryl Amezola Relationship to patient: self Can be reached: [phone] Pharmacy: [id number], [place]  Reason for call: Pt states she is going to [place] in January and needs to have RX for Melfoquin 200mg . She has to take 1-2/week for approx 7 weeks and needs RX for 10 tablets. She mentioned to prevent sickness from the water. Pt also states that she needs RX for Cipro in case she gets a UTI while she is away.

## 2016-02-13 NOTE — Telephone Encounter (Signed)
Called the patient informed prescriptions sent in to [facility].. Patient informed of Provider instructions

## 2016-02-15 ENCOUNTER — Other Ambulatory Visit: Payer: Self-pay | Admitting: Family Medicine

## 2016-02-15 DIAGNOSIS — E1140 Type 2 diabetes mellitus with diabetic neuropathy, unspecified: Secondary | ICD-10-CM

## 2016-04-23 ENCOUNTER — Other Ambulatory Visit: Payer: Self-pay | Admitting: Endocrinology

## 2016-04-23 ENCOUNTER — Other Ambulatory Visit: Payer: Self-pay | Admitting: Family Medicine

## 2016-04-23 DIAGNOSIS — I10 Essential (primary) hypertension: Secondary | ICD-10-CM

## 2016-04-23 DIAGNOSIS — E1140 Type 2 diabetes mellitus with diabetic neuropathy, unspecified: Secondary | ICD-10-CM

## 2016-04-23 DIAGNOSIS — K219 Gastro-esophageal reflux disease without esophagitis: Secondary | ICD-10-CM

## 2016-04-23 DIAGNOSIS — E785 Hyperlipidemia, unspecified: Secondary | ICD-10-CM

## 2016-04-28 ENCOUNTER — Other Ambulatory Visit: Payer: Self-pay | Admitting: Family Medicine

## 2016-05-09 ENCOUNTER — Other Ambulatory Visit: Payer: Self-pay | Admitting: Family Medicine

## 2016-05-09 DIAGNOSIS — G4700 Insomnia, unspecified: Secondary | ICD-10-CM

## 2016-05-11 NOTE — Telephone Encounter (Signed)
Will forward to reg PCP.

## 2016-05-11 NOTE — Telephone Encounter (Signed)
Requesting Remeron 15mg -Take 1 tablet by mouth at bedtime. Last refill:01/15/16 Last OV:01/28/16 with Dr.Lowne Chase Please advise.//AB/CMA

## 2016-05-22 ENCOUNTER — Other Ambulatory Visit: Payer: Self-pay | Admitting: Family Medicine

## 2016-05-25 ENCOUNTER — Other Ambulatory Visit: Payer: Self-pay

## 2016-05-25 DIAGNOSIS — E1140 Type 2 diabetes mellitus with diabetic neuropathy, unspecified: Secondary | ICD-10-CM

## 2016-05-25 DIAGNOSIS — Z1231 Encounter for screening mammogram for malignant neoplasm of breast: Secondary | ICD-10-CM | POA: Diagnosis not present

## 2016-05-25 LAB — HM MAMMOGRAPHY

## 2016-05-25 MED ORDER — TRAMADOL HCL 50 MG PO TABS: 50.0000 mg | ORAL_TABLET | Freq: Four times a day (QID) | ORAL | 0 refills | Status: DC | PRN

## 2016-05-25 NOTE — Telephone Encounter (Signed)
Faxed hardcopy for Tramadol to [facility].

## 2016-06-01 ENCOUNTER — Telehealth: Payer: Self-pay | Admitting: Family Medicine

## 2016-06-01 DIAGNOSIS — M629 Disorder of muscle, unspecified: Secondary | ICD-10-CM

## 2016-06-01 DIAGNOSIS — R29898 Other symptoms and signs involving the musculoskeletal system: Secondary | ICD-10-CM

## 2016-06-01 MED ORDER — CYCLOBENZAPRINE HCL 10 MG PO TABS: 10.0000 mg | ORAL_TABLET | Freq: Three times a day (TID) | ORAL | 0 refills | Status: DC | PRN

## 2016-06-01 NOTE — Telephone Encounter (Signed)
45

## 2016-06-01 NOTE — Telephone Encounter (Signed)
Sent in as instructed 

## 2016-06-01 NOTE — Telephone Encounter (Signed)
Relation to [id number] Call back [place]: [place], [place] [phone] (Phone) [phone] (Fax)     Reason for call:  Patient requesting 3 month supply cyclobenzaprine (FLEXERIL) 10 MG tablet

## 2016-06-04 ENCOUNTER — Encounter: Payer: Self-pay | Admitting: *Deleted

## 2016-06-05 ENCOUNTER — Other Ambulatory Visit: Payer: Self-pay | Admitting: Family Medicine

## 2016-06-05 DIAGNOSIS — R29898 Other symptoms and signs involving the musculoskeletal system: Secondary | ICD-10-CM

## 2016-06-05 DIAGNOSIS — M629 Disorder of muscle, unspecified: Secondary | ICD-10-CM

## 2016-06-08 ENCOUNTER — Ambulatory Visit (INDEPENDENT_AMBULATORY_CARE_PROVIDER_SITE_OTHER): Payer: Medicare Other | Admitting: Family

## 2016-06-08 ENCOUNTER — Encounter: Payer: Self-pay | Admitting: Family

## 2016-06-08 ENCOUNTER — Other Ambulatory Visit (HOSPITAL_COMMUNITY)
Admission: RE | Admit: 2016-06-08 | Discharge: 2016-06-08 | Disposition: A | Discharge status: Home / Self Care | Payer: Medicare Other | Source: Ambulatory Visit | Attending: Family | Admitting: Family

## 2016-06-08 VITALS — BP 146/74 | HR 78 | Temp 97.8°F | Resp 16 | Ht <= 58 in | Wt 126.4 lb

## 2016-06-08 DIAGNOSIS — N760 Acute vaginitis: Secondary | ICD-10-CM | POA: Diagnosis not present

## 2016-06-08 DIAGNOSIS — M629 Disorder of muscle, unspecified: Secondary | ICD-10-CM

## 2016-06-08 DIAGNOSIS — M797 Fibromyalgia: Secondary | ICD-10-CM

## 2016-06-08 DIAGNOSIS — L659 Nonscarring hair loss, unspecified: Secondary | ICD-10-CM

## 2016-06-08 DIAGNOSIS — R29898 Other symptoms and signs involving the musculoskeletal system: Secondary | ICD-10-CM

## 2016-06-08 LAB — CBC WITH DIFFERENTIAL/PLATELET
BASOS ABS: 0 10*3/uL (ref 0.0–0.1)
Basophils Relative: 0.3 % (ref 0.0–3.0)
EOS ABS: 0.1 10*3/uL (ref 0.0–0.7)
EOS PCT: 2.2 % (ref 0.0–5.0)
HCT: 42.8 % (ref 36.0–46.0)
HEMOGLOBIN: 13.9 g/dL (ref 12.0–15.0)
Lymphocytes Relative: 29.4 % (ref 12.0–46.0)
Lymphs Abs: 2 10*3/uL (ref 0.7–4.0)
MCHC: 32.5 g/dL (ref 30.0–36.0)
MCV: 85.5 fl (ref 78.0–100.0)
MONO ABS: 0.6 10*3/uL (ref 0.1–1.0)
Monocytes Relative: 8.4 % (ref 3.0–12.0)
Neutro Abs: 4 10*3/uL (ref 1.4–7.7)
Neutrophils Relative %: 59.7 % (ref 43.0–77.0)
Platelets: 242 10*3/uL (ref 150.0–400.0)
RBC: 5 Mil/uL (ref 3.87–5.11)
RDW: 14.9 % (ref 11.5–15.5)
WBC: 6.7 10*3/uL (ref 4.0–10.5)

## 2016-06-08 LAB — TSH: TSH: 5.28 u[IU]/mL — AB (ref 0.35–4.50)

## 2016-06-08 MED ORDER — FLUCONAZOLE 150 MG PO TABS
ORAL_TABLET | ORAL | 0 refills | Status: DC
Start: 2016-06-08 — End: 2016-08-20

## 2016-06-08 MED ORDER — CYCLOBENZAPRINE HCL 10 MG PO TABS: 10.0000 mg | ORAL_TABLET | Freq: Three times a day (TID) | ORAL | 0 refills | Status: DC | PRN

## 2016-06-08 MED FILL — FLUCONAZOLE 150 MG TABLET: 150 | 6 days supply | Qty: 2 | Fill #0

## 2016-06-08 NOTE — Addendum Note (Signed)
Addended by: Kelle Darting A on: 06/08/2016 09:25 AM   Modules accepted: Orders

## 2016-06-08 NOTE — Patient Instructions (Signed)
Please complete lab work prior to leaving. Begin diflucan for yeast infection. Call if symptoms worsen or if symptoms fail to improve.

## 2016-06-08 NOTE — Progress Notes (Signed)
Pre visit review using our clinic review tool, if applicable. No additional management support is needed unless otherwise documented below in the visit note. 

## 2016-06-08 NOTE — Progress Notes (Signed)
Subjective:    Patient ID: Mary Flynn, female    DOB: [birth year], 82 y.o.   MRN: [id number]  HPI  Mary Flynn is a 82 yr old female who presents today with several concerns:  1) vaginal burning- reports 1 week hx of vaginal burning.  2) hair loss-  Reports hair loss over the last 1 year.   Lab Results  Component Value Date   CREATININE 0.78 12/12/2015   3) Fibromyalgia- reports that she learned that her insurance will cover flexeril again. Notes significant improvement with flexeril and requests a 90 day supply to be sent to her mail order.  Review of Systems See HPI  Past Medical History:  Diagnosis Date  . Diabetes mellitus   . Hyperlipidemia   . Hypertension   . Left rib fracture 02/18/2014     Social History   Social History  . Marital status: Married    Spouse name: Ramla Hase  . Number of children: 2  . Years of education: College   Occupational History  . housewife    Social History Main Topics  . Smoking status: Never Smoker  . Smokeless tobacco: Never Used  . Alcohol use No  . Drug use: No  . Sexual activity: Not Currently   Other Topics Concern  . Not on file   Social History Narrative   Patient lives at home with spouse.   Caffeine Use: rarely    Past Surgical History:  Procedure Laterality Date  . APPENDECTOMY    . REFRACTIVE SURGERY     Left Eye  . surgical sterilization  40 years ago    Family History  Problem Relation Age of Onset  . Diabetes Mother   . Hypertension Father   . Depression    . Cancer      laryngeal  . Diabetes      Allergies  Allergen Reactions  . Ace Inhibitors Cough  . Losartan Cough  . Penicillins Hives  . Sulfa Antibiotics Hives  . Sulfonamide Derivatives     Current Outpatient Prescriptions on File Prior to Visit  Medication Sig Dispense Refill  . amLODipine (NORVASC) 5 MG tablet TAKE 1 TABLET BY MOUTH  DAILY 90 tablet 0  . aspirin EC 81 MG tablet Take 81 mg by mouth daily.    Marland Kitchen atorvastatin  (LIPITOR) 10 MG tablet TAKE 1 TABLET BY MOUTH  EVERY DAY 90 tablet 0  . b complex vitamins tablet Take 1 tablet by mouth daily.    . Blood Glucose Monitoring Suppl (ONE TOUCH ULTRA 2) w/Device KIT Use as directed to test  blood sugar two times daily 1 each 1  . canagliflozin (INVOKANA) 100 MG TABS tablet Take 1 tablet (100 mg total) by mouth daily before breakfast. 90 tablet 2  . cyclobenzaprine (FLEXERIL) 10 MG tablet Take 1 tablet (10 mg total) by mouth 3 (three) times daily as needed for muscle spasms. Reported on 02/14/2015 45 tablet 0  . gabapentin (NEURONTIN) 300 MG capsule Take 1 capsule (300 mg total) by mouth 3 (three) times daily. 270 capsule 1  . glimepiride (AMARYL) 4 MG tablet TAKE 1 TABLET BY MOUTH  TWICE A DAY 180 tablet 0  . glucose blood (ONE TOUCH ULTRA TEST) test strip Use to test blood sugar 2 times daily as instructed. Dx code: E11.49 200 each 3  . Insulin Detemir (LEVEMIR FLEXPEN) 100 UNIT/ML Pen Inject 14 Units into the skin at bedtime. 15 mL 3  . insulin lispro (HUMALOG  KWIKPEN) 100 UNIT/ML KiwkPen Inject into the skin as needed. Inject 4 units at breakfast, 6 units at lunch and 4 units at dinner.    . Insulin Pen Needle 32G X 4 MM MISC Use 3 per day 100 each 3  . ipratropium (ATROVENT) 0.03 % nasal spray Place 2 sprays into both nostrils every 12 (twelve) hours. 30 mL 12  . levocetirizine (XYZAL) 5 MG tablet TAKE 1 TABLET BY MOUTH  EVERY EVENING 90 tablet 2  . meloxicam (MOBIC) 15 MG tablet Take 1 tablet (15 mg total) by mouth daily. 30 tablet 8  . metFORMIN (GLUCOPHAGE) 1000 MG tablet Take 1 tablet (1,000 mg total) by mouth 2 (two) times daily with a meal. 180 tablet 1  . metoprolol succinate (TOPROL-XL) 100 MG 24 hr tablet Take 1 tablet (100 mg total) by mouth daily. Take with or immediately following a meal. 90 tablet 3  . mirtazapine (REMERON) 15 MG tablet TAKE 1 TABLET BY MOUTH AT  BEDTIME 90 tablet 1  . niacin (NIASPAN) 500 MG CR tablet Take 1,000 mg by mouth at  bedtime.    . NONFORMULARY OR COMPOUNDED ITEM Lumbar support belt  #1  As directed 1 each 0  . ONE TOUCH ULTRA TEST test strip TEST TWO TIMES DAILY 200 each 1  . pantoprazole (PROTONIX) 40 MG tablet TAKE 1 TABLET BY MOUTH  DAILY 90 tablet 0  . traMADol (ULTRAM) 50 MG tablet Take 1 tablet (50 mg total) by mouth every 6 (six) hours as needed. 180 tablet 0  . valsartan (DIOVAN) 160 MG tablet Take 2 tablets (320 mg total) by mouth daily. 180 tablet 3  . valsartan (DIOVAN) 320 MG tablet TAKE 1 TABLET BY MOUTH  DAILY 90 tablet 1   No current facility-administered medications on file prior to visit.     BP (!) 146/74 (BP Location: Left Arm, Cuff Size: Normal)   Pulse 78   Temp 97.8 F (36.6 C) (Oral)   Resp 16   Ht '4\' 10"'$  (1.473 m)   Wt 126 lb 6.4 oz (57.3 kg)   SpO2 100% Comment: room air  BMI 26.42 kg/m       Objective:   Physical Exam  Constitutional: She is oriented to person, place, and time. She appears well-developed and well-nourished. No distress.  Cardiovascular: Normal rate and regular rhythm.   No murmur heard. Pulmonary/Chest: Effort normal and breath sounds normal.  Genitourinary:  Genitourinary Comments: Dry atrophic vaginal mucosa noted.  Some white vaginal d/c is noted.   Musculoskeletal: She exhibits no edema.  Neurological: She is alert and oriented to person, place, and time.  Psychiatric: Her behavior is normal. Judgment and thought content normal.          Assessment & Plan:  Vaginitis- suspect yeast. Wet prep to be sent to lab. Advised pt to begin empirin diflucan while we wait on her results. She is also requesting a urinalysis.  Fibromyalgia- stable. Continue flexeril prn.    Hair loss- obtain tsh, cbc.  Doubt that this is medication related.

## 2016-06-09 ENCOUNTER — Telehealth: Payer: Self-pay | Admitting: Family

## 2016-06-09 DIAGNOSIS — E039 Hypothyroidism, unspecified: Secondary | ICD-10-CM

## 2016-06-09 DIAGNOSIS — E038 Other specified hypothyroidism: Secondary | ICD-10-CM

## 2016-06-09 LAB — CERVICOVAGINAL ANCILLARY ONLY: WET PREP (BD AFFIRM): NEGATIVE

## 2016-06-09 NOTE — Telephone Encounter (Signed)
Vaginal swab negative. Let me know if symptoms worsen or if symptoms fail to improve. Blood count is normal.  Thyroid is mildly underactive, please ask lab to add on free t3 and free t4 and I will decide if we need to add medication for thyroid.

## 2016-06-10 ENCOUNTER — Other Ambulatory Visit (INDEPENDENT_AMBULATORY_CARE_PROVIDER_SITE_OTHER): Payer: Medicare Other

## 2016-06-10 DIAGNOSIS — E039 Hypothyroidism, unspecified: Secondary | ICD-10-CM

## 2016-06-10 DIAGNOSIS — I519 Heart disease, unspecified: Secondary | ICD-10-CM

## 2016-06-10 NOTE — Telephone Encounter (Signed)
Add on form faxed to lab  

## 2016-06-11 ENCOUNTER — Encounter: Payer: Self-pay | Admitting: Family

## 2016-06-11 DIAGNOSIS — E038 Other specified hypothyroidism: Secondary | ICD-10-CM | POA: Insufficient documentation

## 2016-06-11 DIAGNOSIS — E039 Hypothyroidism, unspecified: Secondary | ICD-10-CM | POA: Insufficient documentation

## 2016-06-11 LAB — T3, FREE: T3 FREE: 2.7 pg/mL (ref 2.3–4.2)

## 2016-06-11 LAB — T4, FREE: FREE T4: 1.12 ng/dL (ref 0.60–1.60)

## 2016-06-12 ENCOUNTER — Ambulatory Visit: Payer: Self-pay | Admitting: *Deleted

## 2016-06-12 NOTE — Telephone Encounter (Signed)
Notified pt and she voices understanding. Reports that vaginal burning is 3/4 better but still lingering so she took the 2nd diflucan tablet yesterday. Advised her to let us know if symptoms do not resolve. Please advise regarding additional thyroid testing / medication?  Pt requests that lab results be mailed to home address. Will wait until thyroid testing complete.

## 2016-06-15 NOTE — Telephone Encounter (Signed)
Follow up thyroid testing is normal. I  Would recommend follow up TSH, free t3 and free t4 in 2 months.

## 2016-06-15 NOTE — Telephone Encounter (Signed)
Letter re: below recommendation and copy of labs mailed to pt. Lab orders pended.

## 2016-06-19 ENCOUNTER — Other Ambulatory Visit: Payer: Self-pay | Admitting: Family

## 2016-06-19 ENCOUNTER — Other Ambulatory Visit (INDEPENDENT_AMBULATORY_CARE_PROVIDER_SITE_OTHER): Payer: Medicare Other

## 2016-06-19 DIAGNOSIS — E038 Other specified hypothyroidism: Secondary | ICD-10-CM

## 2016-06-19 DIAGNOSIS — E039 Hypothyroidism, unspecified: Secondary | ICD-10-CM | POA: Diagnosis not present

## 2016-06-19 LAB — TSH: TSH: 5.5 u[IU]/mL — ABNORMAL HIGH (ref 0.35–4.50)

## 2016-06-19 LAB — T3, FREE: T3 FREE: 3 pg/mL (ref 2.3–4.2)

## 2016-06-19 LAB — T4, FREE: Free T4: 1.06 ng/dL (ref 0.60–1.60)

## 2016-06-19 NOTE — Telephone Encounter (Signed)
Lab shows mildly underactive thyroid. Begin synthroid, repeat TSH in 6 weeks. Pls send to her preferred pharmacy.

## 2016-06-22 MED ORDER — LEVOTHYROXINE SODIUM 25 MCG PO TABS: 25.0000 ug | ORAL_TABLET | Freq: Every day | ORAL | 1 refills | Status: DC

## 2016-06-22 MED FILL — LEVOTHYROXINE 25 MCG TABLET: 25 | 30 days supply | Qty: 30 | Fill #0

## 2016-06-22 NOTE — Telephone Encounter (Signed)
Notified pt and she voices understanding. Rx sent to [facility]. Lab appt scheduled for 08/04/16 at 10am, future order entered.

## 2016-06-29 ENCOUNTER — Telehealth: Payer: Self-pay | Admitting: Family Medicine

## 2016-06-29 NOTE — Telephone Encounter (Signed)
Walk in   Pt walked in to clinic - stated that 06/09/16 when she came in for labs that she requested to change providers to [facility] as Dr. Etter Sjogren is often not able to see her for acute appts or is not available when needed. Advised pt I would msg providers with her request.

## 2016-06-29 NOTE — Telephone Encounter (Signed)
Ok with me 

## 2016-06-30 NOTE — Telephone Encounter (Signed)
Pt.notified

## 2016-07-02 ENCOUNTER — Other Ambulatory Visit: Payer: Self-pay | Admitting: Family Medicine

## 2016-07-02 DIAGNOSIS — E1140 Type 2 diabetes mellitus with diabetic neuropathy, unspecified: Secondary | ICD-10-CM

## 2016-07-02 DIAGNOSIS — E785 Hyperlipidemia, unspecified: Secondary | ICD-10-CM

## 2016-07-02 DIAGNOSIS — I10 Essential (primary) hypertension: Secondary | ICD-10-CM

## 2016-07-02 DIAGNOSIS — K219 Gastro-esophageal reflux disease without esophagitis: Secondary | ICD-10-CM

## 2016-07-07 ENCOUNTER — Telehealth: Payer: Self-pay | Admitting: *Deleted

## 2016-07-07 DIAGNOSIS — E1140 Type 2 diabetes mellitus with diabetic neuropathy, unspecified: Secondary | ICD-10-CM

## 2016-07-07 MED ORDER — GABAPENTIN 300 MG PO CAPS: 300.0000 mg | ORAL_CAPSULE | Freq: Three times a day (TID) | ORAL | 1 refills | Status: DC

## 2016-07-07 NOTE — Telephone Encounter (Signed)
Received fax from [facility] requesting refill of gabapentin 300mg . 1 capsule three times daily. Refills sent.

## 2016-07-15 ENCOUNTER — Ambulatory Visit (INDEPENDENT_AMBULATORY_CARE_PROVIDER_SITE_OTHER): Payer: Medicare Other | Admitting: Family

## 2016-07-15 VITALS — BP 147/61 | HR 85 | Temp 98.1°F | Resp 18 | Ht <= 58 in | Wt 130.2 lb

## 2016-07-15 DIAGNOSIS — G4700 Insomnia, unspecified: Secondary | ICD-10-CM

## 2016-07-15 DIAGNOSIS — Z794 Long term (current) use of insulin: Secondary | ICD-10-CM | POA: Diagnosis not present

## 2016-07-15 DIAGNOSIS — E119 Type 2 diabetes mellitus without complications: Secondary | ICD-10-CM

## 2016-07-15 DIAGNOSIS — J309 Allergic rhinitis, unspecified: Secondary | ICD-10-CM | POA: Diagnosis not present

## 2016-07-15 DIAGNOSIS — R632 Polyphagia: Secondary | ICD-10-CM | POA: Diagnosis not present

## 2016-07-15 DIAGNOSIS — E039 Hypothyroidism, unspecified: Secondary | ICD-10-CM | POA: Diagnosis not present

## 2016-07-15 LAB — BASIC METABOLIC PANEL
BUN: 21 mg/dL (ref 6–23)
CHLORIDE: 98 meq/L (ref 96–112)
CO2: 26 mEq/L (ref 19–32)
CREATININE: 0.71 mg/dL (ref 0.40–1.20)
Calcium: 9.2 mg/dL (ref 8.4–10.5)
GFR: 84.24 mL/min (ref >60.00)
Glucose, Bld: 156 mg/dL — ABNORMAL HIGH (ref 70–99)
POTASSIUM: 3.7 meq/L (ref 3.5–5.1)
SODIUM: 133 meq/L — AB (ref 135–145)

## 2016-07-15 LAB — HEMOGLOBIN A1C: HEMOGLOBIN A1C: 7.6 % — AB (ref 4.6–6.5)

## 2016-07-15 LAB — TSH: TSH: 3.81 u[IU]/mL (ref 0.35–4.50)

## 2016-07-15 MED ORDER — FLUTICASONE PROPIONATE 50 MCG/ACT NA SUSP: 2.0000 | Freq: Every day | NASAL | 6 refills | Status: DC

## 2016-07-15 NOTE — Progress Notes (Signed)
Subjective:    Patient ID: Mary Flynn, female    DOB: [birth year], 83 y.o.   MRN: [id number]  HPI   Mary Flynn is a 82 yr old female who presents today with concern of "increased appetite."  Reports increased hunger.  She reports very rare   Wt Readings from Last 3 Encounters:  07/15/16 130 lb 3.2 oz (59.1 kg)  06/08/16 126 lb 6.4 oz (57.3 kg)  01/28/16 126 lb 12.8 oz (57.5 kg)   DM2- she is maintained on invokana, amaryl, levemir, humalog.  Lab Results  Component Value Date   HGBA1C 7.3 (H) 12/23/2015   HGBA1C 7.6 (H) 12/12/2015   HGBA1C 8.3 (H) 03/22/2015   Lab Results  Component Value Date   MICROALBUR 1.4 12/12/2015   LDLCALC 37 12/23/2015   CREATININE 0.78 12/12/2015    She is having some "mucous" in her throat.  She is continuing zyrtec daily.  She has seen ENT in the past.  She reports that she restarted mirtazapine 2-3 months ago.    Goes to bed 10PM. Sleep latency 10-30 minutes. Reports that she gets up at midnight or 1AM, then has trouble getting back to sleep. Dozes off in the early AM.  She drinks coffee in the AM, some tea in the afternoon.    Review of Systems See HPI  Past Medical History:  Diagnosis Date  . Diabetes mellitus   . Hyperlipidemia   . Hypertension   . Left rib fracture 02/18/2014     Social History   Social History  . Marital status: Married    Spouse name: Mary Flynn  . Number of children: 2  . Years of education: College   Occupational History  . housewife    Social History Main Topics  . Smoking status: Never Smoker  . Smokeless tobacco: Never Used  . Alcohol use No  . Drug use: No  . Sexual activity: Not Currently   Other Topics Concern  . Not on file   Social History Narrative   Patient lives at home with spouse.   Caffeine Use: rarely    Past Surgical History:  Procedure Laterality Date  . APPENDECTOMY    . REFRACTIVE SURGERY     Left Eye  . surgical sterilization  40 years ago    Family History    Problem Relation Age of Onset  . Diabetes Mother   . Hypertension Father   . Depression Unknown   . Cancer Unknown        laryngeal  . Diabetes Unknown     Allergies  Allergen Reactions  . Ace Inhibitors Cough  . Losartan Cough  . Penicillins Hives  . Sulfa Antibiotics Hives  . Sulfonamide Derivatives     Current Outpatient Prescriptions on File Prior to Visit  Medication Sig Dispense Refill  . amLODipine (NORVASC) 5 MG tablet TAKE 1 TABLET BY MOUTH  DAILY 90 tablet 0  . aspirin EC 81 MG tablet Take 81 mg by mouth daily.    Marland Kitchen atorvastatin (LIPITOR) 10 MG tablet TAKE 1 TABLET BY MOUTH  EVERY DAY 90 tablet 0  . b complex vitamins tablet Take 1 tablet by mouth daily.    . Blood Glucose Monitoring Suppl (ONE TOUCH ULTRA 2) w/Device KIT Use as directed to test  blood sugar two times daily 1 each 1  . canagliflozin (INVOKANA) 100 MG TABS tablet Take 1 tablet (100 mg total) by mouth daily before breakfast. 90 tablet 2  . cyclobenzaprine (FLEXERIL)  10 MG tablet Take 1 tablet (10 mg total) by mouth 3 (three) times daily as needed for muscle spasms. Reported on 02/14/2015 270 tablet 0  . fluconazole (DIFLUCAN) 150 MG tablet 1 tablet by mouth today. May repeat in 3 days as needed 2 tablet 0  . gabapentin (NEURONTIN) 300 MG capsule Take 1 capsule (300 mg total) by mouth 3 (three) times daily. 270 capsule 1  . glimepiride (AMARYL) 4 MG tablet TAKE 1 TABLET BY MOUTH  TWICE A DAY 180 tablet 0  . glucose blood (ONE TOUCH ULTRA TEST) test strip Use to test blood sugar 2 times daily as instructed. Dx code: E11.49 200 each 3  . Insulin Detemir (LEVEMIR FLEXPEN) 100 UNIT/ML Pen Inject 14 Units into the skin at bedtime. 15 mL 3  . insulin lispro (HUMALOG KWIKPEN) 100 UNIT/ML KiwkPen Inject into the skin as needed. Inject 4 units at breakfast, 6 units at lunch and 4 units at dinner.    . Insulin Pen Needle 32G X 4 MM MISC Use 3 per day 100 each 3  . ipratropium (ATROVENT) 0.03 % nasal spray Place 2  sprays into both nostrils every 12 (twelve) hours. 30 mL 12  . levocetirizine (XYZAL) 5 MG tablet TAKE 1 TABLET BY MOUTH  EVERY EVENING 90 tablet 2  . levothyroxine (SYNTHROID, LEVOTHROID) 25 MCG tablet Take 1 tablet (25 mcg total) by mouth daily before breakfast. 30 tablet 1  . meloxicam (MOBIC) 15 MG tablet Take 1 tablet (15 mg total) by mouth daily. 30 tablet 8  . metFORMIN (GLUCOPHAGE) 1000 MG tablet Take 1 tablet (1,000 mg total) by mouth 2 (two) times daily with a meal. 180 tablet 1  . metoprolol succinate (TOPROL-XL) 100 MG 24 hr tablet Take 1 tablet (100 mg total) by mouth daily. Take with or immediately following a meal. 90 tablet 3  . mirtazapine (REMERON) 15 MG tablet TAKE 1 TABLET BY MOUTH AT  BEDTIME 90 tablet 1  . niacin (NIASPAN) 500 MG CR tablet Take 1,000 mg by mouth at bedtime.    . NONFORMULARY OR COMPOUNDED ITEM Lumbar support belt  #1  As directed 1 each 0  . ONE TOUCH ULTRA TEST test strip TEST TWO TIMES DAILY 200 each 1  . pantoprazole (PROTONIX) 40 MG tablet TAKE 1 TABLET BY MOUTH  DAILY 90 tablet 0  . traMADol (ULTRAM) 50 MG tablet Take 1 tablet (50 mg total) by mouth every 6 (six) hours as needed. 180 tablet 0  . valsartan (DIOVAN) 160 MG tablet Take 2 tablets (320 mg total) by mouth daily. 180 tablet 3  . valsartan (DIOVAN) 320 MG tablet TAKE 1 TABLET BY MOUTH  DAILY 90 tablet 1   No current facility-administered medications on file prior to visit.     BP (!) 147/61 (BP Location: Right Arm, Cuff Size: Normal)   Pulse 85   Temp 98.1 F (36.7 C) (Oral)   Resp 18   Ht _0  (1.473 m)   Wt 130 lb 3.2 oz (59.1 kg)   SpO2 99%   BMI 27.21 kg/m       Objective:   Physical Exam  Constitutional: She appears well-developed and well-nourished.  HENT:  Head: Normocephalic and atraumatic.  Right Ear: Tympanic membrane and ear canal normal.  Left Ear: Tympanic membrane and ear canal normal.  Mouth/Throat: No oropharyngeal exudate, posterior oropharyngeal edema or  posterior oropharyngeal erythema.  Cardiovascular: Normal rate, regular rhythm and normal heart sounds.   No murmur heard. Pulmonary/Chest:  Effort normal and breath sounds normal. No respiratory distress. She has no wheezes.  Psychiatric: She has a normal mood and affect. Her behavior is normal. Judgment and thought content normal.          Assessment & Plan:  Increased Appetite- likely due to restarting remeron. Advised pt to d/c remeron. Check follow up A1C.   Hypothyroid- obtain follow up tsh, continue low dose synthroid.  Allergic rhinitis- uncontrolled.  on zyzal, add back in flonase.   Insomnia- advised pt to try melatonin, eliminate caffeine.

## 2016-07-15 NOTE — Patient Instructions (Addendum)
Stop mirtazapine due to increased hunger Change to decaf tea to help with sleep. Add flonase once daily to see if this helps with your congestion.

## 2016-07-16 ENCOUNTER — Telehealth: Payer: Self-pay | Admitting: Family

## 2016-07-16 NOTE — Telephone Encounter (Signed)
Thyroid testing is good, please continue current dose of synthroid. Sugar is slightly above goal.  Please keep your upcoming appointment with Dr. Dwyane Dee.

## 2016-07-21 MED ORDER — LEVOTHYROXINE SODIUM 25 MCG PO TABS: 25.0000 ug | ORAL_TABLET | Freq: Every day | ORAL | 1 refills | Status: DC

## 2016-07-21 MED FILL — LEVOTHYROXINE 25 MCG TABLET: 25 | 30 days supply | Qty: 30 | Fill #1

## 2016-07-21 NOTE — Telephone Encounter (Signed)
Notified pt and she wanted to know what type of snacks to focus on. Advised pt per verbal from PCP, vegetables (carrots, celery, cucmbers, etc. .), no more than 2 fruits a day and small amount of nuts. Pt voices understanding.

## 2016-07-21 NOTE — Telephone Encounter (Signed)
Insulin can cause hunger.  I would recommend that she focus on healthy snacks to avoid weight gain.

## 2016-07-21 NOTE — Telephone Encounter (Signed)
Attempted to reach pt and left message to return my call. Pt also likes results mailed to her so I have mailed copy of labs along with below note from PCP.

## 2016-07-21 NOTE — Telephone Encounter (Signed)
Pt states she has not taken mirtazapine x 1 week and she continues to be very hungry. Wants to know what else could be causing the increase in her appetite?

## 2016-07-30 DIAGNOSIS — Z961 Presence of intraocular lens: Secondary | ICD-10-CM | POA: Diagnosis not present

## 2016-07-30 DIAGNOSIS — H348322 Tributary (branch) retinal vein occlusion, left eye, stable: Secondary | ICD-10-CM | POA: Diagnosis not present

## 2016-07-30 DIAGNOSIS — H40013 Open angle with borderline findings, low risk, bilateral: Secondary | ICD-10-CM | POA: Diagnosis not present

## 2016-08-04 ENCOUNTER — Other Ambulatory Visit: Payer: Self-pay

## 2016-08-06 ENCOUNTER — Other Ambulatory Visit: Payer: Self-pay

## 2016-08-07 ENCOUNTER — Other Ambulatory Visit (INDEPENDENT_AMBULATORY_CARE_PROVIDER_SITE_OTHER): Payer: Medicare Other

## 2016-08-07 DIAGNOSIS — Z794 Long term (current) use of insulin: Secondary | ICD-10-CM

## 2016-08-07 DIAGNOSIS — E1165 Type 2 diabetes mellitus with hyperglycemia: Secondary | ICD-10-CM | POA: Diagnosis not present

## 2016-08-07 LAB — COMPREHENSIVE METABOLIC PANEL
ALK PHOS: 57 U/L (ref 39–117)
ALT: 22 U/L (ref 0–35)
AST: 22 U/L (ref 0–37)
Albumin: 4.4 g/dL (ref 3.5–5.2)
BUN: 17 mg/dL (ref 6–23)
CO2: 28 mEq/L (ref 19–32)
CREATININE: 0.72 mg/dL (ref 0.40–1.20)
Calcium: 9.7 mg/dL (ref 8.4–10.5)
Chloride: 99 mEq/L (ref 96–112)
GFR: 82.88 mL/min (ref >60.00)
GLUCOSE: 148 mg/dL — AB (ref 70–99)
POTASSIUM: 4.2 meq/L (ref 3.5–5.1)
Sodium: 135 mEq/L (ref 135–145)
TOTAL PROTEIN: 6.8 g/dL (ref 6.0–8.3)
Total Bilirubin: 1.2 mg/dL (ref 0.2–1.2)

## 2016-08-07 LAB — HEMOGLOBIN A1C: HEMOGLOBIN A1C: 7.6 % — AB (ref 4.6–6.5)

## 2016-08-08 ENCOUNTER — Other Ambulatory Visit: Payer: Self-pay | Admitting: Family

## 2016-08-08 DIAGNOSIS — M629 Disorder of muscle, unspecified: Secondary | ICD-10-CM

## 2016-08-08 DIAGNOSIS — R29898 Other symptoms and signs involving the musculoskeletal system: Secondary | ICD-10-CM

## 2016-08-10 NOTE — Telephone Encounter (Signed)
Could you please ask patient what she uses the flexeril for?  Is it back pain, and how many times a day is she actually using?

## 2016-08-10 NOTE — Telephone Encounter (Signed)
eScribe request from [facility] for refill on Cyclobenzaprine 10 mg tab Last filled - 06/08/16, #[id number] Last AEX - 07/15/16, has F/U with Dr. Dwyane Dee tomorrow Please Advise on refills/SLS 06/18

## 2016-08-11 ENCOUNTER — Encounter: Payer: Self-pay | Admitting: Endocrinology

## 2016-08-11 ENCOUNTER — Ambulatory Visit (INDEPENDENT_AMBULATORY_CARE_PROVIDER_SITE_OTHER): Payer: Medicare Other | Admitting: Endocrinology

## 2016-08-11 VITALS — BP 128/74 | HR 100 | Ht <= 58 in | Wt 129.6 lb

## 2016-08-11 DIAGNOSIS — Z794 Long term (current) use of insulin: Secondary | ICD-10-CM | POA: Diagnosis not present

## 2016-08-11 DIAGNOSIS — E063 Autoimmune thyroiditis: Secondary | ICD-10-CM | POA: Diagnosis not present

## 2016-08-11 DIAGNOSIS — I10 Essential (primary) hypertension: Secondary | ICD-10-CM

## 2016-08-11 DIAGNOSIS — E1165 Type 2 diabetes mellitus with hyperglycemia: Secondary | ICD-10-CM

## 2016-08-11 NOTE — Patient Instructions (Addendum)
Check blood sugars on waking up  5/7 days  Also check blood sugars about 2 hours after a meal and do this after different meals by rotation  Recommended blood sugar levels on waking up is 90-130 and about 2 hours after meal is 130-160  Please bring your blood sugar monitor to each visit, thank you  More protein at meals

## 2016-08-11 NOTE — Telephone Encounter (Signed)
Patient returning call.

## 2016-08-11 NOTE — Telephone Encounter (Signed)
Left message for pt to return my call.

## 2016-08-11 NOTE — Telephone Encounter (Signed)
Pt uses flexeril for fibromyalgia and body aches. She has a lot of back pain. She also uses to help her sleep at night. She uses 2/day.

## 2016-08-11 NOTE — Progress Notes (Signed)
Patient ID: Mary Flynn, female   DOB: 1934/06/18, 82 y.o.   MRN: [id number]   Reason for Appointment: Diabetes follow-up   History of Present Illness   Diagnosis: Type 2 DIABETES MELITUS, date of diagnosis:   1995    Previous history: She has been on various regimens of hypoglycemic drugs over the last several years including Actos, Amaryl and Januvia. Because of cost her Januvia was stopped and Actos was stopped because of potential weight gain She was initially started on Levemir insulin because of fasting hyperglycemia in 2009 and the dose has been adjusted periodically In the last year she is also quite mealtime coverage because of postprandial hyperglycemia despite usually eating a low carbohydrate diet   Recent history:   Insulin regimen: Levemir 12 units hs;  log 6-5 before meals based on meal size   Oral hypoglycemic drugs: Amaryl 4 mg twice a day, metformin, Invokana 100 mg daily  She has not been regular with her follow-up again, coming back after 8 months  Her A1c has been generally better with using Invokana which she is able to take now regularly She is doing reasonably well with A1c down to 7.6 consistently  Current blood sugar patterns and problems identified:  FASTING blood sugars are somewhat variable and probably a little higher recently  She said that she gets hungry during the night and she will eat causing her sugars to go up, sometimes also drinking juice; last month she also had a relatively low sugar of 66 at 5 AM  Despite reminders she does not check her readings after meals  She claimed that she feels hypoglycemic sometime during the day but does not document any low sugars  Also she does not reduce her mealtime insulin even if she tends to have low sugars; she has however stopped her HUMALOG at suppertime on her own  She has checked her sugar at about 8 PM only once which was over 200, this may be before dinner  She is trying to be active  with various kind of exercises and her weight is stable  She thinks she is getting protein with her meals but may not be consistent  Hypoglycemia :  none documented but she feels some weakness when blood sugars are in the 70s and 80s    Side effects from medications: None.          Proper timing of medications in relation to meals: She is taking her NovoLog right after eating at times       Monitors blood glucose: Once or twice a day.    Glucometer:  One Touch       Blood Glucose readings from meter download:   Mean values apply above for all meters except median for One Touch  PRE-MEAL Fasting Lunch Dinner Bedtime Overall  Glucose range: 71-1 70  71, 83  232     Mean/median: 120       POST-MEAL PC Breakfast PC Lunch PC Dinner  Glucose range:  119    Mean/median:       Meals: 3 meals per day. she is a vegetarian. Trying to get some protein at most meals including dairy products and tofu  Has Bfst 10 am,  lunch around 2 PM, dinner at 8 PM       Physical activity: exercise: water exercises, Some walking     Last visit with dietitian: 2009   Wt Readings from Last 3 Encounters:  08/11/16 129 lb 9.6  oz (58.8 kg)  07/15/16 130 lb 3.2 oz (59.1 kg)  06/08/16 126 lb 6.4 oz (57.3 kg)    LABS:  Lab Results  Component Value Date   HGBA1C 7.6 (H) 08/07/2016   HGBA1C 7.6 (H) 07/15/2016   HGBA1C 7.3 (H) 12/23/2015   Lab Results  Component Value Date   MICROALBUR 1.4 12/12/2015   LDLCALC 37 12/23/2015   CREATININE 0.72 08/07/2016     Allergies as of 08/11/2016      Reactions   Ace Inhibitors Cough   Losartan Cough   Penicillins Hives   Sulfa Antibiotics Hives   Sulfonamide Derivatives       Medication List       Accurate as of 08/11/16 11:59 PM. Always use your most recent med list.          amLODipine 5 MG tablet Commonly known as:  NORVASC TAKE 1 TABLET BY MOUTH  DAILY   aspirin EC 81 MG tablet Take 81 mg by mouth daily.   atorvastatin 10 MG  tablet Commonly known as:  LIPITOR TAKE 1 TABLET BY MOUTH  EVERY DAY   b complex vitamins tablet Take 1 tablet by mouth daily.   cyclobenzaprine 10 MG tablet Commonly known as:  FLEXERIL Take 1 tablet (10 mg total) by mouth 3 (three) times daily as needed for muscle spasms. Reported on 02/14/2015   DIOVAN 160 MG tablet Generic drug:  valsartan Take 2 tablets (320 mg total) by mouth daily.   valsartan 320 MG tablet Commonly known as:  DIOVAN TAKE 1 TABLET BY MOUTH  DAILY   fluconazole 150 MG tablet Commonly known as:  DIFLUCAN 1 tablet by mouth today. May repeat in 3 days as needed   fluticasone 50 MCG/ACT nasal spray Commonly known as:  FLONASE Place 2 sprays into both nostrils daily.   gabapentin 300 MG capsule Commonly known as:  NEURONTIN Take 1 capsule (300 mg total) by mouth 3 (three) times daily.   glimepiride 4 MG tablet Commonly known as:  AMARYL TAKE 1 TABLET BY MOUTH  TWICE A DAY   glucose blood test strip Commonly known as:  ONE TOUCH ULTRA TEST Use to test blood sugar 2 times daily as instructed. Dx code: E11.49   ONE TOUCH ULTRA TEST test strip Generic drug:  glucose blood TEST TWO TIMES DAILY   HUMALOG KWIKPEN 100 UNIT/ML KiwkPen Generic drug:  insulin lispro Inject into the skin as needed. Inject 5-6 units at breakfast, 5 units at lunch   Insulin Pen Needle 32G X 4 MM Misc Use 3 per day   ipratropium 0.03 % nasal spray Commonly known as:  ATROVENT Place 2 sprays into both nostrils every 12 (twelve) hours.   LEVEMIR FLEXPEN 100 UNIT/ML Pen Generic drug:  Insulin Detemir Inject 12 Units into the skin at bedtime.   levocetirizine 5 MG tablet Commonly known as:  XYZAL TAKE 1 TABLET BY MOUTH  EVERY EVENING   levothyroxine 25 MCG tablet Commonly known as:  SYNTHROID, LEVOTHROID Take 1 tablet (25 mcg total) by mouth daily before breakfast.   meloxicam 15 MG tablet Commonly known as:  MOBIC Take 1 tablet (15 mg total) by mouth daily.    metFORMIN 1000 MG tablet Commonly known as:  GLUCOPHAGE Take 1 tablet (1,000 mg total) by mouth 2 (two) times daily with a meal.   metoprolol succinate 100 MG 24 hr tablet Commonly known as:  TOPROL-XL Take 1 tablet (100 mg total) by mouth daily. Take with or immediately  following a meal.   niacin 500 MG CR tablet Commonly known as:  NIASPAN Take 1,000 mg by mouth at bedtime.   NONFORMULARY OR COMPOUNDED ITEM Lumbar support belt  #1  As directed   ONE TOUCH ULTRA 2 w/Device Kit Use as directed to test  blood sugar two times daily   pantoprazole 40 MG tablet Commonly known as:  PROTONIX TAKE 1 TABLET BY MOUTH  DAILY   traMADol 50 MG tablet Commonly known as:  ULTRAM Take 1 tablet (50 mg total) by mouth every 6 (six) hours as needed.       Allergies:  Allergies  Allergen Reactions  . Ace Inhibitors Cough  . Losartan Cough  . Penicillins Hives  . Sulfa Antibiotics Hives  . Sulfonamide Derivatives     Past Medical History:  Diagnosis Date  . Diabetes mellitus   . Hyperlipidemia   . Hypertension   . Left rib fracture 02/18/2014    Past Surgical History:  Procedure Laterality Date  . APPENDECTOMY    . REFRACTIVE SURGERY     Left Eye  . surgical sterilization  40 years ago    Family History  Problem Relation Age of Onset  . Diabetes Mother   . Hypertension Father   . Depression Unknown   . Cancer Unknown        laryngeal  . Diabetes Unknown     Social History:  reports that she has never smoked. She has never used smokeless tobacco. She reports that she does not drink alcohol or use drugs.  Review of Systems:  Hypertension:    On  Diovan 320 mg, metoprolol and amlodipine, followed by PCP She does not monitor at home regularly and blood pressure is followed by PCP  BP Readings from Last 3 Encounters:  08/11/16 128/74  07/15/16 (!) 147/61  06/08/16 (!) 146/74     Lab Results  Component Value Date   CREATININE 0.72 08/07/2016   BUN 17  08/07/2016   NA 135 08/07/2016   K 4.2 08/07/2016   CL 99 08/07/2016   CO2 28 08/07/2016     Lipids: Has had diabetic dyslipidemia,  well controlled as of 2017   Lab Results  Component Value Date   CHOL 104 12/23/2015   HDL 47.70 12/23/2015   LDLCALC 37 12/23/2015   TRIG 97.0 12/23/2015   CHOLHDL 2 12/23/2015     Diabetic foot exam done in 10/17  Diabetic foot exam showed normal monofilament sensation in the toes and plantar surfaces, no skin lesions or ulcers on the feet and normal pedal pulses  She has been regular with her eye exams annually  HYPOTHYROIDISM: She has been screened by her PCP and baseline TSH was above 5. She thinks she is feeling a little better with energy level with starting levothyroxine 25 g   Lab Results  Component Value Date   TSH 3.81 07/15/2016    Asking about feeling excessively hungry at times but is not gaining weight  All other general medical questions that she asked today were discussed   Examination:   BP 128/74   Pulse 100   Ht '4\' 10"'$  (1.473 m)   Wt 129 lb 9.6 oz (58.8 kg)   SpO2 95%   BMI 27.09 kg/m   Body mass index is 27.09 kg/m.   No ankle edema present  ASSESSMENT/ PLAN:   Diabetes type 2  With BMI 26  See history of present illness for detailed discussion of  current management, blood  sugar patterns and problems identified  A1c is 7.6 which is probably adequate for age and duration of diabetes As discussed above she does not check readings after meals to help adjust her Humalog She claims that she is sometimes getting low sugars but does not monitor these and does not adjust her insulin Continues to be benefiting with Invokana 9 this is helping her keep her weight down and that sugars stable  Does have tendency to high readings at suppertime even without any snacks in between meals  Recommendations:   She was instructed on when to check her blood sugar  She does need to check readings periodically after  meals especially after supper when she is not taking any Humalog  Discussed that we can only adjust her Humalog at request and lunch if she is checking readings after meals  Also needs to check sugar when she feels low  She needs to add more protein to her meals and also nighttime snacks to prevent variability in blood sugars and occasional hypoglycemia  She will also need to make note of whether she is getting low sugars after exercise  No change in Levemir at present, discussed that this may need to be increased if fasting readings are consistently high without nighttime snacks  She does need more regular follow-up  HYPERTENSION: This is well controlled without orthostatic symptoms  Increased hunger: Etiology unclear and unrelated to the thyroid  Patient Instructions  Check blood sugars on waking up  5/7 days  Also check blood sugars about 2 hours after a meal and do this after different meals by rotation  Recommended blood sugar levels on waking up is 90-130 and about 2 hours after meal is 130-160  Please bring your blood sugar monitor to each visit, thank you  More protein at meals    Counseling time on subjects discussed above is over 50% of today's 25 minute visit   Takuya Lariccia 08/12/2016, 7:47 AM

## 2016-08-12 MED ORDER — CYCLOBENZAPRINE HCL 10 MG PO TABS: 10.0000 mg | ORAL_TABLET | Freq: Two times a day (BID) | ORAL | 0 refills | Status: DC | PRN

## 2016-08-13 ENCOUNTER — Telehealth: Payer: Self-pay | Admitting: Family

## 2016-08-13 DIAGNOSIS — R0981 Nasal congestion: Secondary | ICD-10-CM

## 2016-08-13 NOTE — Telephone Encounter (Signed)
Patient's spouse called to see if wife could get referral to ENT for reoccurring sinus issues   [phone]

## 2016-08-13 NOTE — Telephone Encounter (Signed)
Referral placed.

## 2016-08-13 NOTE — Telephone Encounter (Signed)
Notified pt and she voices understanding. 

## 2016-08-17 ENCOUNTER — Telehealth: Payer: Self-pay | Admitting: Family

## 2016-08-17 MED ORDER — CYCLOBENZAPRINE HCL 10 MG PO TABS: 10.0000 mg | ORAL_TABLET | Freq: Two times a day (BID) | ORAL | 0 refills | Status: DC | PRN

## 2016-08-17 NOTE — Telephone Encounter (Signed)
Spoke with pt. She states she has not been contacted re: ENT appt yet and she still has a lot of mucus in her throat and nasal spray that PCP recommended is not helping. Pt also states she was prescribed diflucan a couple months ago when she had some vaginal burning. Symptoms cleared up but has recently returned. Pt requests appt to discuss both with PCP. Appt scheduled for tomorrow at 12:15pm.

## 2016-08-17 NOTE — Telephone Encounter (Signed)
Pt called stating flexeril should have gone to mail order pharmacy. Cancelled Rx at [facility] and re-sent to [facility].

## 2016-08-17 NOTE — Telephone Encounter (Signed)
Left message for pt to return my call.

## 2016-08-17 NOTE — Addendum Note (Signed)
Addended by: Kelle Darting A on: 08/17/2016 11:39 AM   Modules accepted: Orders

## 2016-08-17 NOTE — Telephone Encounter (Signed)
Caller name: Relationship to patient: Self Can be reached: [phone]  Pharmacy:  Reason for call: Request call back to discuss yeast infection

## 2016-08-18 ENCOUNTER — Other Ambulatory Visit (HOSPITAL_COMMUNITY)
Admission: RE | Admit: 2016-08-18 | Discharge: 2016-08-18 | Disposition: A | Discharge status: Home / Self Care | Payer: Medicare Other | Source: Ambulatory Visit | Attending: Family | Admitting: Family

## 2016-08-18 ENCOUNTER — Encounter: Payer: Self-pay | Admitting: Family

## 2016-08-18 ENCOUNTER — Ambulatory Visit (INDEPENDENT_AMBULATORY_CARE_PROVIDER_SITE_OTHER): Payer: Medicare Other | Admitting: Family

## 2016-08-18 VITALS — BP 140/67 | HR 88 | Temp 98.1°F | Resp 16 | Ht <= 58 in | Wt 129.2 lb

## 2016-08-18 DIAGNOSIS — R6889 Other general symptoms and signs: Secondary | ICD-10-CM | POA: Diagnosis not present

## 2016-08-18 DIAGNOSIS — B9689 Other specified bacterial agents as the cause of diseases classified elsewhere: Secondary | ICD-10-CM | POA: Diagnosis not present

## 2016-08-18 DIAGNOSIS — N761 Subacute and chronic vaginitis: Secondary | ICD-10-CM

## 2016-08-18 DIAGNOSIS — N949 Unspecified condition associated with female genital organs and menstrual cycle: Secondary | ICD-10-CM

## 2016-08-18 DIAGNOSIS — B373 Candidiasis of vulva and vagina: Secondary | ICD-10-CM | POA: Insufficient documentation

## 2016-08-18 NOTE — Patient Instructions (Signed)
You may use keflex as needed if you develop a urinary tract infection while you are in [place]. For your chest congestion- you may try mucinex 600mg  twice daily as needed. Try using Replens vaginal moisturizer which is available over the counter.

## 2016-08-18 NOTE — Progress Notes (Signed)
Subjective:    Patient ID: Mary Flynn, female    DOB: 24-Jul-1934, 82 y.o.   MRN: [id number]  HPI  Mary Flynn is a 82 yr old female who presents today with c/o vaginal burning. Reports that she completed diflucan 2 weeks ago. Only hurts with wiping.  Denies dysuria.  She reports that she used to take premarin tablets > 10 years ago.    Last visit we stopped remeron due to increased appetite. She notes that her appetite seems to be returning to normal.   Congestion- she reports that she is using zyxal as we discussed last visit. She continues to have thick mucous accumulate in the back of her throat. This is reported to be a long term problem. Has seen ENT in the past for the congestion in the back of her throat. Notes that she was told no sign of reflux. Has seen allergist and told that her symptoms were not allergy related.   She will be travelling to [place] for 3 months and reports that she has a difficult time getting medical care there. She is requesting rx to bring with her in case of UTI.   Review of Systems See HPI  Past Medical History:  Diagnosis Date  . Diabetes mellitus   . Hyperlipidemia   . Hypertension   . Left rib fracture 02/18/2014     Social History   Social History  . Marital status: Married    Spouse name: Itzell Bendavid  . Number of children: 2  . Years of education: College   Occupational History  . housewife    Social History Main Topics  . Smoking status: Never Smoker  . Smokeless tobacco: Never Used  . Alcohol use No  . Drug use: No  . Sexual activity: Not Currently   Other Topics Concern  . Not on file   Social History Narrative   Patient lives at home with spouse.   Caffeine Use: rarely    Past Surgical History:  Procedure Laterality Date  . APPENDECTOMY    . REFRACTIVE SURGERY     Left Eye  . surgical sterilization  40 years ago    Family History  Problem Relation Age of Onset  . Diabetes Mother   . Hypertension Father   .  Depression Unknown   . Cancer Unknown        laryngeal  . Diabetes Unknown     Allergies  Allergen Reactions  . Ace Inhibitors Cough  . Losartan Cough  . Penicillins Hives  . Sulfa Antibiotics Hives  . Sulfonamide Derivatives     Current Outpatient Prescriptions on File Prior to Visit  Medication Sig Dispense Refill  . amLODipine (NORVASC) 5 MG tablet TAKE 1 TABLET BY MOUTH  DAILY 90 tablet 0  . aspirin EC 81 MG tablet Take 81 mg by mouth daily.    Marland Kitchen atorvastatin (LIPITOR) 10 MG tablet TAKE 1 TABLET BY MOUTH  EVERY DAY 90 tablet 0  . b complex vitamins tablet Take 1 tablet by mouth daily.    . Blood Glucose Monitoring Suppl (ONE TOUCH ULTRA 2) w/Device KIT Use as directed to test  blood sugar two times daily 1 each 1  . cyclobenzaprine (FLEXERIL) 10 MG tablet Take 1 tablet (10 mg total) by mouth 2 (two) times daily as needed for muscle spasms. 180 tablet 0  . fluticasone (FLONASE) 50 MCG/ACT nasal spray Place 2 sprays into both nostrils daily. 16 g 6  . gabapentin (NEURONTIN) 300  MG capsule Take 1 capsule (300 mg total) by mouth 3 (three) times daily. 270 capsule 1  . glimepiride (AMARYL) 4 MG tablet TAKE 1 TABLET BY MOUTH  TWICE A DAY (Patient taking differently: TAKE 1/2 TABLET BY MOUTH  TWICE A DAY) 180 tablet 0  . glucose blood (ONE TOUCH ULTRA TEST) test strip Use to test blood sugar 2 times daily as instructed. Dx code: E11.49 200 each 3  . Insulin Detemir (LEVEMIR FLEXPEN) 100 UNIT/ML Pen Inject 12 Units into the skin at bedtime.  15 mL 3  . insulin lispro (HUMALOG KWIKPEN) 100 UNIT/ML KiwkPen Inject into the skin as needed. Inject 5-6 units at breakfast, 5 units at lunch    . Insulin Pen Needle 32G X 4 MM MISC Use 3 per day 100 each 3  . ipratropium (ATROVENT) 0.03 % nasal spray Place 2 sprays into both nostrils every 12 (twelve) hours. 30 mL 12  . levocetirizine (XYZAL) 5 MG tablet TAKE 1 TABLET BY MOUTH  EVERY EVENING 90 tablet 2  . levothyroxine (SYNTHROID, LEVOTHROID) 25  MCG tablet Take 1 tablet (25 mcg total) by mouth daily before breakfast. 90 tablet 1  . meloxicam (MOBIC) 15 MG tablet Take 1 tablet (15 mg total) by mouth daily. 30 tablet 8  . metFORMIN (GLUCOPHAGE) 1000 MG tablet Take 1 tablet (1,000 mg total) by mouth 2 (two) times daily with a meal. 180 tablet 1  . metoprolol succinate (TOPROL-XL) 100 MG 24 hr tablet Take 1 tablet (100 mg total) by mouth daily. Take with or immediately following a meal. 90 tablet 3  . niacin (NIASPAN) 500 MG CR tablet Take 1,000 mg by mouth at bedtime.    . NONFORMULARY OR COMPOUNDED ITEM Lumbar support belt  #1  As directed 1 each 0  . ONE TOUCH ULTRA TEST test strip TEST TWO TIMES DAILY 200 each 1  . pantoprazole (PROTONIX) 40 MG tablet TAKE 1 TABLET BY MOUTH  DAILY 90 tablet 0  . valsartan (DIOVAN) 160 MG tablet Take 2 tablets (320 mg total) by mouth daily. 180 tablet 3  . valsartan (DIOVAN) 320 MG tablet TAKE 1 TABLET BY MOUTH  DAILY 90 tablet 1  . fluconazole (DIFLUCAN) 150 MG tablet 1 tablet by mouth today. May repeat in 3 days as needed (Patient not taking: Reported on 08/18/2016) 2 tablet 0   No current facility-administered medications on file prior to visit.     BP 140/67 (BP Location: Right Arm, Cuff Size: Normal)   Pulse 88   Temp 98.1 F (36.7 C) (Oral)   Resp 16   Ht '4\' 10"'$  (1.473 m)   Wt 129 lb 3.2 oz (58.6 kg)   SpO2 100%   BMI 27.00 kg/m       Objective:   Physical Exam  Constitutional: She is oriented to person, place, and time. She appears well-developed and well-nourished.  Cardiovascular: Normal rate, regular rhythm and normal heart sounds.   No murmur heard. Pulmonary/Chest: Effort normal and breath sounds normal. No respiratory distress. She has no wheezes.  Musculoskeletal: She exhibits no edema.  Neurological: She is alert and oriented to person, place, and time.  Skin: Skin is warm and dry.  Psychiatric: She has a normal mood and affect. Her behavior is normal. Judgment and thought  content normal.  GYN:  Vaginal atrophy is noted.  No rashes or lesions. + vaginal dryness is noted.        Assessment & Plan:  Vaginitis- suspect atrophic vaginitis. We  discussed that this is likely related to estrogen deficiency and only real treatment is vaginal or oral estrogen therapy. She is not interested in HRT. She will focus on topical lubricants for now and we will check vaginal ancillary testing for yeast/bv.   Congestion- advised pt on trial of prn mucinex.  Rx provided to use "PRN" while traveling if she develops UTI.

## 2016-08-19 MED ORDER — CIPROFLOXACIN HCL 250 MG PO TABS: 250.0000 mg | ORAL_TABLET | Freq: Two times a day (BID) | ORAL | 0 refills | Status: DC

## 2016-08-19 MED FILL — CIPROFLOXACIN HCL 250 MG TA: 250 | 3 days supply | Qty: 6 | Fill #0

## 2016-08-20 ENCOUNTER — Other Ambulatory Visit: Payer: Self-pay | Admitting: Family

## 2016-08-20 LAB — CERVICOVAGINAL ANCILLARY ONLY
Bacterial vaginitis: NEGATIVE
CANDIDA VAGINITIS: POSITIVE — AB

## 2016-08-20 MED ORDER — FLUCONAZOLE 150 MG PO TABS: ORAL_TABLET | ORAL | 0 refills | Status: DC

## 2016-08-20 NOTE — Progress Notes (Signed)
Testing shows yeast infection. rx sent for diflucan.

## 2016-08-20 NOTE — Progress Notes (Signed)
Notified pt and she voices understanding. Rx was sent to mail order. Faxed cancellation request to [facility] and re-sent Rx to [facility].

## 2016-08-21 ENCOUNTER — Telehealth: Payer: Self-pay | Admitting: Family

## 2016-08-21 NOTE — Telephone Encounter (Signed)
Caller name: Maeleigh Relation to pt: self  Call back number: [phone] Pharmacy: [facility]  Reason for call: Pt came in office stating that saw Melissa on 08-18-2016, provider suggested if pt gets urine infection while in [place] to get prescription for Keflex, pt is wanting to know if she can get Keflex rx before leaving on Tuesday morning (08-25-2016). Please advise ASAP.

## 2016-08-21 NOTE — Telephone Encounter (Signed)
Please advise.  Cipro was sent to the pharmacy.  Pt states she doesn't have a prescription for the keflex.

## 2016-08-22 NOTE — Telephone Encounter (Signed)
Left detailed message that Rex for viper was sent instead of keflex and to call if further questions.

## 2016-08-24 MED FILL — FLUCONAZOLE 150 MG TABLET: 150 | 2 days supply | Qty: 2 | Fill #0

## 2016-09-14 ENCOUNTER — Other Ambulatory Visit: Payer: Self-pay | Admitting: Family Medicine

## 2016-09-14 DIAGNOSIS — I10 Essential (primary) hypertension: Secondary | ICD-10-CM

## 2016-09-14 DIAGNOSIS — K219 Gastro-esophageal reflux disease without esophagitis: Secondary | ICD-10-CM

## 2016-09-14 DIAGNOSIS — E1140 Type 2 diabetes mellitus with diabetic neuropathy, unspecified: Secondary | ICD-10-CM

## 2016-09-14 DIAGNOSIS — E785 Hyperlipidemia, unspecified: Secondary | ICD-10-CM

## 2016-09-18 ENCOUNTER — Other Ambulatory Visit: Payer: Self-pay | Admitting: Endocrinology

## 2016-09-18 ENCOUNTER — Other Ambulatory Visit: Payer: Self-pay | Admitting: Family

## 2016-09-18 ENCOUNTER — Telehealth: Payer: Self-pay | Admitting: Family

## 2016-09-18 MED ORDER — BENZONATATE 100 MG PO CAPS: 100.0000 mg | ORAL_CAPSULE | Freq: Three times a day (TID) | ORAL | 0 refills | Status: DC | PRN

## 2016-09-18 NOTE — Telephone Encounter (Signed)
A prescription has been sent to her pharmacy for Tessalon. Please advise patient that if her symptoms worsen or fail to improve she should seek medical care where she is staying.

## 2016-09-18 NOTE — Telephone Encounter (Signed)
Pt informed

## 2016-09-18 NOTE — Telephone Encounter (Signed)
Pt says that she had a cough and was taking a medication prescribed by PCP. She said that she stopped taking medication but would like a call back to discuss starting back on it. She said that [place] is [place] and have started back coughing. Pt would like a call back at    [facility]: [phone]

## 2016-09-22 ENCOUNTER — Other Ambulatory Visit: Payer: Self-pay | Admitting: Endocrinology

## 2016-10-07 ENCOUNTER — Other Ambulatory Visit: Payer: Self-pay | Admitting: Family

## 2016-10-07 ENCOUNTER — Other Ambulatory Visit: Payer: Self-pay | Admitting: Family Medicine

## 2016-10-07 DIAGNOSIS — I10 Essential (primary) hypertension: Secondary | ICD-10-CM

## 2016-10-07 DIAGNOSIS — K219 Gastro-esophageal reflux disease without esophagitis: Secondary | ICD-10-CM

## 2016-10-07 DIAGNOSIS — E785 Hyperlipidemia, unspecified: Secondary | ICD-10-CM

## 2016-10-07 DIAGNOSIS — E1140 Type 2 diabetes mellitus with diabetic neuropathy, unspecified: Secondary | ICD-10-CM

## 2016-10-07 NOTE — Telephone Encounter (Signed)
Melissa-- please advise fluconazole request. Last rx 08/20/16, #2.

## 2016-10-08 NOTE — Telephone Encounter (Signed)
OK to send refill if diflucan with instruction. Take one tablet by mouth once as meeded for vaginal yeast infection.  #1 tab with 2 refills.

## 2016-10-09 ENCOUNTER — Telehealth: Payer: Self-pay

## 2016-10-09 NOTE — Telephone Encounter (Signed)
Received call vis [facility] regarding  Collaborating physician for Mary Flynn. Information given to patient.

## 2016-10-09 NOTE — Telephone Encounter (Signed)
Refill sent.

## 2016-10-16 ENCOUNTER — Telehealth: Payer: Self-pay

## 2016-10-16 NOTE — Telephone Encounter (Signed)
YL-I am taking care of refills and looks like this pt is on Valasartan which was recalled/would you like to make a change in this? Plz advise/thx dmf

## 2016-10-16 NOTE — Telephone Encounter (Signed)
Thank you/dmf 

## 2016-10-16 NOTE — Telephone Encounter (Signed)
If she did not get a letter from the pharmacy or phone call hers is probably ok  Only certain generic companys were recalled

## 2016-11-16 ENCOUNTER — Other Ambulatory Visit: Payer: Self-pay | Admitting: Family Medicine

## 2016-11-20 ENCOUNTER — Other Ambulatory Visit: Payer: Self-pay | Admitting: Family

## 2016-11-20 DIAGNOSIS — E1140 Type 2 diabetes mellitus with diabetic neuropathy, unspecified: Secondary | ICD-10-CM

## 2016-11-20 DIAGNOSIS — E785 Hyperlipidemia, unspecified: Secondary | ICD-10-CM

## 2016-11-20 DIAGNOSIS — K219 Gastro-esophageal reflux disease without esophagitis: Secondary | ICD-10-CM

## 2016-11-23 ENCOUNTER — Encounter: Payer: Self-pay | Admitting: Family

## 2016-11-23 ENCOUNTER — Ambulatory Visit (HOSPITAL_BASED_OUTPATIENT_CLINIC_OR_DEPARTMENT_OTHER)
Admission: RE | Admit: 2016-11-23 | Discharge: 2016-11-23 | Disposition: A | Discharge status: Home / Self Care | Payer: Medicare Other | Source: Ambulatory Visit | Attending: Family | Admitting: Family

## 2016-11-23 ENCOUNTER — Ambulatory Visit (INDEPENDENT_AMBULATORY_CARE_PROVIDER_SITE_OTHER): Payer: Medicare Other | Admitting: Family

## 2016-11-23 VITALS — BP 140/63 | HR 84 | Temp 98.5°F | Resp 18 | Ht <= 58 in | Wt 135.0 lb

## 2016-11-23 DIAGNOSIS — E039 Hypothyroidism, unspecified: Secondary | ICD-10-CM

## 2016-11-23 DIAGNOSIS — R05 Cough: Secondary | ICD-10-CM | POA: Diagnosis not present

## 2016-11-23 DIAGNOSIS — R059 Cough, unspecified: Secondary | ICD-10-CM

## 2016-11-23 DIAGNOSIS — I700 Atherosclerosis of aorta: Secondary | ICD-10-CM | POA: Diagnosis not present

## 2016-11-23 DIAGNOSIS — Z23 Encounter for immunization: Secondary | ICD-10-CM | POA: Diagnosis not present

## 2016-11-23 MED ORDER — ALBUTEROL SULFATE HFA 108 (90 BASE) MCG/ACT IN AERS: 2.0000 | INHALATION_SPRAY | Freq: Four times a day (QID) | RESPIRATORY_TRACT | 0 refills | Status: DC | PRN

## 2016-11-23 NOTE — Progress Notes (Signed)
Subjective:    Patient ID: Mary Flynn, female    DOB: 1934/07/24, 82 y.o.   MRN: [id number]  HPI  Pt is an 82 yr old female who presents today with chief complaint of cough.  She reports A long-standing history of cough. She reports that she had an extensive workup for it in the past which was negative. She reports that cough had improved until she was started on Synthroid. She stops Synthroid for 1 week and cough improved she then restarted Synthroid cough returned. She continues to have cough.   Vaginal burning-denies current vaginal burning or itching.     Review of Systems See HPI  Past Medical History:  Diagnosis Date  . Diabetes mellitus   . Hyperlipidemia   . Hypertension   . Left rib fracture 02/18/2014     Social History   Social History  . Marital status: Married    Spouse name: Shaney Deckman  . Number of children: 2  . Years of education: College   Occupational History  . housewife    Social History Main Topics  . Smoking status: Never Smoker  . Smokeless tobacco: Never Used  . Alcohol use No  . Drug use: No  . Sexual activity: Not Currently   Other Topics Concern  . Not on file   Social History Narrative   Patient lives at home with spouse.   Caffeine Use: rarely    Past Surgical History:  Procedure Laterality Date  . APPENDECTOMY    . REFRACTIVE SURGERY     Left Eye  . surgical sterilization  40 years ago    Family History  Problem Relation Age of Onset  . Diabetes Mother   . Hypertension Father   . Depression Unknown   . Cancer Unknown        laryngeal  . Diabetes Unknown     Allergies  Allergen Reactions  . Ace Inhibitors Cough  . Losartan Cough  . Penicillins Hives  . Sulfa Antibiotics Hives  . Sulfonamide Derivatives     Current Outpatient Prescriptions on File Prior to Visit  Medication Sig Dispense Refill  . amLODipine (NORVASC) 5 MG tablet TAKE 1 TABLET BY MOUTH  DAILY 90 tablet 0  . aspirin EC 81 MG tablet Take 81  mg by mouth daily.    Marland Kitchen atorvastatin (LIPITOR) 10 MG tablet TAKE 1 TABLET BY MOUTH  EVERY DAY 90 tablet 0  . b complex vitamins tablet Take 1 tablet by mouth daily.    . Blood Glucose Monitoring Suppl (ONE TOUCH ULTRA 2) w/Device KIT USE AS DIRECTED TO TEST  BLOOD SUGAR TWO TIMES DAILY 1 each 1  . cyclobenzaprine (FLEXERIL) 10 MG tablet Take 1 tablet (10 mg total) by mouth 2 (two) times daily as needed for muscle spasms. 180 tablet 0  . fluconazole (DIFLUCAN) 150 MG tablet Take 1 tablet by mouth once as needed for vaginal infection. 1 tablet 2  . fluticasone (FLONASE) 50 MCG/ACT nasal spray Place 2 sprays into both nostrils daily. 16 g 6  . gabapentin (NEURONTIN) 300 MG capsule Take 1 capsule (300 mg total) by mouth 3 (three) times daily. 270 capsule 1  . glimepiride (AMARYL) 4 MG tablet TAKE 1 TABLET BY MOUTH  TWICE A DAY 180 tablet 0  . glucose blood (ONE TOUCH ULTRA TEST) test strip Use to test blood sugar 2 times daily as instructed. Dx code: E11.49 200 each 3  . Insulin Detemir (LEVEMIR FLEXPEN) 100 UNIT/ML Pen Inject  12 Units into the skin at bedtime.  15 mL 3  . insulin lispro (HUMALOG KWIKPEN) 100 UNIT/ML KiwkPen Inject into the skin as needed. Inject 5-6 units at breakfast, 5 units at lunch    . Insulin Pen Needle 32G X 4 MM MISC Use 3 per day 100 each 3  . ipratropium (ATROVENT) 0.03 % nasal spray Place 2 sprays into both nostrils every 12 (twelve) hours. 30 mL 12  . levocetirizine (XYZAL) 5 MG tablet TAKE 1 TABLET BY MOUTH  EVERY EVENING 90 tablet 0  . levothyroxine (SYNTHROID, LEVOTHROID) 25 MCG tablet Take 1 tablet (25 mcg total) by mouth daily before breakfast. 90 tablet 1  . meloxicam (MOBIC) 15 MG tablet Take 1 tablet (15 mg total) by mouth daily. 30 tablet 8  . metFORMIN (GLUCOPHAGE) 1000 MG tablet Take 1 tablet (1,000 mg total) by mouth 2 (two) times daily with a meal. 180 tablet 1  . metoprolol succinate (TOPROL-XL) 100 MG 24 hr tablet TAKE 1 TABLET BY MOUTH  DAILY WITH OR  IMMEDIATLEY  FOLLOWING A MEAL 90 tablet 0  . niacin (NIASPAN) 500 MG CR tablet Take 1,000 mg by mouth at bedtime.    . NONFORMULARY OR COMPOUNDED ITEM Lumbar support belt  #1  As directed 1 each 0  . ONE TOUCH ULTRA TEST test strip TEST TWO TIMES DAILY 200 each 2  . pantoprazole (PROTONIX) 40 MG tablet TAKE 1 TABLET BY MOUTH  DAILY 90 tablet 0  . valsartan (DIOVAN) 320 MG tablet TAKE 1 TABLET BY MOUTH  DAILY 90 tablet 1   No current facility-administered medications on file prior to visit.     BP 140/63 (BP Location: Right Arm, Cuff Size: Normal)   Pulse 84   Temp 98.5 F (36.9 C) (Oral)   Resp 18   Ht '4\' 10"'$  (1.473 m)   Wt 135 lb (61.2 kg)   SpO2 98%   BMI 28.22 kg/m       Objective:   Physical Exam  Constitutional: She is oriented to person, place, and time. She appears well-developed and well-nourished.  Cardiovascular: Normal rate, regular rhythm and normal heart sounds.   No murmur heard. Pulmonary/Chest: Effort normal and breath sounds normal. No respiratory distress. She has no wheezes.  Musculoskeletal: She exhibits no edema.  Neurological: She is alert and oriented to person, place, and time.  Psychiatric: She has a normal mood and affect. Her behavior is normal. Judgment and thought content normal.            Assessment & Plan:  Cough- I am uncertain if her Synthroid is truly contributing to her cough. We discussed having her meet with her pulmonologist again, Dr. Arlyss Gandy however she declines. She does see endocrinology and is overdue for follow-up. She asked if I would discuss the Synthroid with her endocrinologist and I have sent him a message. For now I will go ahead and added albuterol inhaler as needed that she reports occasional wheezing. Will check follow-up TSH and obtain a chest x-ray for further evaluation. I've advised her schedule follow-up with her endocrinologist.   Hypothyroid- Continue Synthroid for now. Obtain TSH

## 2016-11-23 NOTE — Patient Instructions (Addendum)
Please begin albuterol as needed.  Please schedule follow up with Dr. Dwyane Dee. Complete your chest x ray on the first floor.  Complete lab work prior to leaving.

## 2016-11-24 LAB — TSH: TSH: 1.64 u[IU]/mL (ref 0.35–4.50)

## 2016-11-27 ENCOUNTER — Ambulatory Visit (INDEPENDENT_AMBULATORY_CARE_PROVIDER_SITE_OTHER): Payer: Medicare Other | Admitting: Endocrinology

## 2016-11-27 ENCOUNTER — Encounter: Payer: Self-pay | Admitting: Endocrinology

## 2016-11-27 VITALS — BP 120/68 | HR 93 | Ht <= 58 in | Wt 134.4 lb

## 2016-11-27 DIAGNOSIS — R05 Cough: Secondary | ICD-10-CM

## 2016-11-27 DIAGNOSIS — R059 Cough, unspecified: Secondary | ICD-10-CM

## 2016-11-27 DIAGNOSIS — E063 Autoimmune thyroiditis: Secondary | ICD-10-CM

## 2016-11-27 DIAGNOSIS — E1142 Type 2 diabetes mellitus with diabetic polyneuropathy: Secondary | ICD-10-CM | POA: Diagnosis not present

## 2016-11-27 LAB — POCT GLYCOSYLATED HEMOGLOBIN (HGB A1C): Hemoglobin A1C: 7.6

## 2016-11-27 NOTE — Patient Instructions (Addendum)
Check blood sugars on waking up  3/7  Also check blood sugars DAILY about 2 hours after a meal and do this after different meals by rotation  Recommended blood sugar levels on waking up is 90-130 and about 2 hours after meal is 130-160  Please bring your blood sugar monitor to each visit, thank you  Try thyoid just before Bfst

## 2016-11-27 NOTE — Progress Notes (Signed)
Patient ID: Mary Flynn, female   DOB: 1934-10-31, 82 y.o.   MRN: [id number]   Reason for Appointment: Diabetes and general follow-up   History of Present Illness   Diagnosis: Type 2 DIABETES MELITUS, date of diagnosis:   1995    Previous history: She has been on various regimens of hypoglycemic drugs over the last several years including Actos, Amaryl and Januvia. Because of cost her Januvia was stopped and Actos was stopped because of potential weight gain She was initially started on Levemir insulin because of fasting hyperglycemia in 2009 and the dose has been adjusted periodically In the last year she is also quite mealtime coverage because of postprandial hyperglycemia despite usually eating a low carbohydrate diet   Recent history:   Insulin regimen: Levemir 12 units hs;  Humalog 6-6 pc meals based on meal size   Oral hypoglycemic drugs: Amaryl 4 mg twice a day, metformin  She is doing reasonably well with A1c 7.6 consistently  Current blood sugar patterns and problems identified:  She has been taking only metformin and Amaryl and has stopped taking Invokana because of cost  FASTING blood sugars are somewhat variable   She thinks that this is related to her inconsistent diet and eating sweets  Because of various issues she has not exercised as consistently also  She still has not checked her sugars AFTER meals despite reminders and has no postprandial readings on this visit  She tries to take her Humalog just before eating but frequently is taking it right after eating and is not adjusting his based on what she is eating  At times she would take 1-2 units of Humalog if she is eating some sweets snacks  No hypoglycemia reported recently  Hypoglycemia :  none documented but she feels some weakness when blood sugars are in the 70s and 80s    Side effects from medications: None.                Monitors blood glucose: Once or twice a day.    Glucometer:  One  Touch ultra 2        Blood Glucose readings from meter download:    FASTING blood sugar range 83-1 74, median 139   Meals: 3 meals per day. she is a vegetarian. Trying to get some protein at most meals including dairy products and tofu  Has Bfst 10 am,  lunch around 2 PM, dinner at 8 PM       Physical activity: exercise:  Some walking     Last visit with dietitian: 2009   Wt Readings from Last 3 Encounters:  11/27/16 134 lb 6.4 oz (61 kg)  11/23/16 135 lb (61.2 kg)  08/18/16 129 lb 3.2 oz (58.6 kg)    LABS:  Lab Results  Component Value Date   HGBA1C 7.6 11/27/2016   HGBA1C 7.6 (H) 08/07/2016   HGBA1C 7.6 (H) 07/15/2016   Lab Results  Component Value Date   MICROALBUR 1.4 12/12/2015   LDLCALC 37 12/23/2015   CREATININE 0.72 08/07/2016    Other issues discussed today: See review of systems   Allergies as of 11/27/2016      Reactions   Ace Inhibitors Cough   Losartan Cough   Penicillins Hives   Sulfa Antibiotics Hives   Sulfonamide Derivatives       Medication List       Accurate as of 11/27/16  2:26 PM. Always use your most recent med list.  albuterol 108 (90 Base) MCG/ACT inhaler Commonly known as:  PROVENTIL HFA;VENTOLIN HFA Inhale 2 puffs into the lungs every 6 (six) hours as needed for wheezing or shortness of breath.   amLODipine 5 MG tablet Commonly known as:  NORVASC TAKE 1 TABLET BY MOUTH  DAILY   aspirin EC 81 MG tablet Take 81 mg by mouth daily.   atorvastatin 10 MG tablet Commonly known as:  LIPITOR TAKE 1 TABLET BY MOUTH  EVERY DAY   b complex vitamins tablet Take 1 tablet by mouth daily.   cyclobenzaprine 10 MG tablet Commonly known as:  FLEXERIL Take 1 tablet (10 mg total) by mouth 2 (two) times daily as needed for muscle spasms.   fluconazole 150 MG tablet Commonly known as:  DIFLUCAN Take 1 tablet by mouth once as needed for vaginal infection.   fluticasone 50 MCG/ACT nasal spray Commonly known as:  FLONASE Place 2  sprays into both nostrils daily.   gabapentin 300 MG capsule Commonly known as:  NEURONTIN Take 1 capsule (300 mg total) by mouth 3 (three) times daily.   glimepiride 4 MG tablet Commonly known as:  AMARYL TAKE 1 TABLET BY MOUTH  TWICE A DAY   glucose blood test strip Commonly known as:  ONE TOUCH ULTRA TEST Use to test blood sugar 2 times daily as instructed. Dx code: E11.49   ONE TOUCH ULTRA TEST test strip Generic drug:  glucose blood TEST TWO TIMES DAILY   HUMALOG KWIKPEN 100 UNIT/ML KiwkPen Generic drug:  insulin lispro Inject into the skin as needed. Inject 5-6 units at breakfast, 5 units at lunch   Insulin Pen Needle 32G X 4 MM Misc Use 3 per day   ipratropium 0.03 % nasal spray Commonly known as:  ATROVENT Place 2 sprays into both nostrils every 12 (twelve) hours.   latanoprost 0.005 % ophthalmic solution Commonly known as:  XALATAN Place 1 drop into both eyes at bedtime.   LEVEMIR FLEXPEN 100 UNIT/ML Pen Generic drug:  Insulin Detemir Inject 12 Units into the skin at bedtime.   levocetirizine 5 MG tablet Commonly known as:  XYZAL TAKE 1 TABLET BY MOUTH  EVERY EVENING   levothyroxine 25 MCG tablet Commonly known as:  SYNTHROID, LEVOTHROID Take 1 tablet (25 mcg total) by mouth daily before breakfast.   meloxicam 15 MG tablet Commonly known as:  MOBIC Take 1 tablet (15 mg total) by mouth daily.   metFORMIN 1000 MG tablet Commonly known as:  GLUCOPHAGE Take 1 tablet (1,000 mg total) by mouth 2 (two) times daily with a meal.   metoprolol succinate 100 MG 24 hr tablet Commonly known as:  TOPROL-XL TAKE 1 TABLET BY MOUTH  DAILY WITH OR IMMEDIATLEY  FOLLOWING A MEAL   niacin 500 MG CR tablet Commonly known as:  NIASPAN Take 1,000 mg by mouth at bedtime.   NONFORMULARY OR COMPOUNDED ITEM Lumbar support belt  #1  As directed   ONE TOUCH ULTRA 2 w/Device Kit USE AS DIRECTED TO TEST  BLOOD SUGAR TWO TIMES DAILY   pantoprazole 40 MG tablet Commonly  known as:  PROTONIX TAKE 1 TABLET BY MOUTH  DAILY   timolol 0.5 % ophthalmic solution Commonly known as:  TIMOPTIC Place 1 drop into both eyes every morning.   valsartan 320 MG tablet Commonly known as:  DIOVAN TAKE 1 TABLET BY MOUTH  DAILY       Allergies:  Allergies  Allergen Reactions  . Ace Inhibitors Cough  . Losartan Cough  .  Penicillins Hives  . Sulfa Antibiotics Hives  . Sulfonamide Derivatives     Past Medical History:  Diagnosis Date  . Diabetes mellitus   . Hyperlipidemia   . Hypertension   . Left rib fracture 02/18/2014    Past Surgical History:  Procedure Laterality Date  . APPENDECTOMY    . REFRACTIVE SURGERY     Left Eye  . surgical sterilization  40 years ago    Family History  Problem Relation Age of Onset  . Diabetes Mother   . Hypertension Father   . Depression Unknown   . Cancer Unknown        laryngeal  . Diabetes Unknown     Social History:  reports that she has never smoked. She has never used smokeless tobacco. She reports that she does not drink alcohol or use drugs.  Review of Systems:   HYPOTHYROIDISM: Patient was asked to see me by her PCP because she was refusing to take her levothyroxine She says that her cough has come back, previously may have been related to losartan Previously she had thyroid levels checked because of hair loss and since her TSH is mildly increased she has been on 25 g levothyroxine She thinks her hair loss and energy level has been better with this  However she is convinced that her levothyroxine is causing her cough because it got better when she stopped it for a week She has been on Protonix possibly for reflux  Lab Results  Component Value Date   TSH 1.64 11/23/2016   TSH 3.81 07/15/2016   TSH 5.50 (H) 06/19/2016   FREET4 1.06 06/19/2016   FREET4 1.12 06/10/2016   FREET4 0.93 10/17/2014     Hypertension:    She does not monitor at home regularly and blood pressure is followed by  PCP  BP Readings from Last 3 Encounters:  11/27/16 120/68  11/23/16 140/63  08/18/16 140/67     Lab Results  Component Value Date   CREATININE 0.72 08/07/2016   BUN 17 08/07/2016   NA 135 08/07/2016   K 4.2 08/07/2016   CL 99 08/07/2016   CO2 28 08/07/2016    Lipids: Has had diabetic dyslipidemia,  well controlled as of 2017   Lab Results  Component Value Date   CHOL 104 12/23/2015   HDL 47.70 12/23/2015   LDLCALC 37 12/23/2015   TRIG 97.0 12/23/2015   CHOLHDL 2 12/23/2015     Diabetic foot exam done in 10/17  Diabetic foot exam showed normal monofilament sensation in the toes and plantar surfaces, no skin lesions or ulcers on the feet and normal pedal pulses       Examination:   BP 120/68   Pulse 93   Ht _0  (1.473 m)   Wt 134 lb 6.4 oz (61 kg)   SpO2 96%   BMI 28.09 kg/m   Body mass index is 28.09 kg/m.     ASSESSMENT/ PLAN:   Diabetes type 2  With BMI 26  See history of present illness for detailed discussion of  current management, blood sugar patterns and problems identified  A1c is 7.6 which is probably adequate for age and duration of diabetes Most likely has higher postprandial readings since her fasting readings are averaging under 140 She has gained a little weight This may be related to her not taking Invokana She probably does need more insulin to cover some meals but since she does not check readings after meals not clear how much  insulin she needs for meals Also can do better with snacks and sweets   Recommendations:   She was reminded about the importance of checking readings after meals even if it is not exactly 2 hours later  She will keep her Levemir, metformin and Amaryl unchanged  She may need to adjust her Humalog based on the amount of carbohydrate and meal size  Again she can take 2 units more if she is eating sweets with her meals  Encouraged her to start back on water exercises  She agrees to come back in 2  months for follow-up and bring her monitor with more readings for evaluation  HYPOTHYROIDISM: Adequately controlled with only 25 g and probably didn't benefit from taking this with her energy level and some hair loss  Explained to the patient that there is no connection between cough and levothyroxine She can however try to take her levothyroxine just before breakfast instead of taking it 3 hours earlier Explained to her what symptoms are related to hypothyroidism  If she does not have any improvement in her cough she will need to go back to her pulmonologist, meanwhile she will try to get started on her inhaler that was prescribed  HYPERTENSION: This is well controlled     Patient Instructions  Check blood sugars on waking up  3/7  Also check blood sugars DAILY about 2 hours after a meal and do this after different meals by rotation  Recommended blood sugar levels on waking up is 90-130 and about 2 hours after meal is 130-160  Please bring your blood sugar monitor to each visit, thank you  Try thyoid just before Bfst    Counseling time on subjects discussed in assessment and plan sections is over 50% of today's 25 minute visit   Breniyah Romm 11/27/2016, 2:26 PM

## 2016-12-01 ENCOUNTER — Other Ambulatory Visit: Payer: Self-pay | Admitting: Family

## 2016-12-01 NOTE — Telephone Encounter (Signed)
Rx approved and sent to the pharmacy by e-script.//AB/CMA 

## 2016-12-02 ENCOUNTER — Telehealth: Payer: Self-pay | Admitting: *Deleted

## 2016-12-02 MED ORDER — ALBUTEROL SULFATE HFA 108 (90 BASE) MCG/ACT IN AERS: 2.0000 | INHALATION_SPRAY | Freq: Four times a day (QID) | RESPIRATORY_TRACT | 1 refills | Status: DC | PRN

## 2016-12-02 NOTE — Telephone Encounter (Signed)
Received fax from [facility] that ventolin is non-formulary and preferred alternative is Proair HFA. Please advise if ok to send rx of Proair with same instructions as ventolin?

## 2016-12-02 NOTE — Telephone Encounter (Signed)
Yes, ok to to send please.

## 2016-12-02 NOTE — Telephone Encounter (Signed)
Rx sent 

## 2016-12-15 ENCOUNTER — Telehealth: Payer: Self-pay | Admitting: Family

## 2016-12-15 ENCOUNTER — Institutional Professional Consult (permissible substitution): Payer: Self-pay | Admitting: Internal Medicine

## 2016-12-15 NOTE — Telephone Encounter (Signed)
Spoke with Barnetta Chapel at [facility] and was advised that guidelines for dispensing this medication is 3 treatments = 90 day supply if no Rx history on file. Rx from June was processed at 30 day because quantity was only for 2 treatments. She did confirm that Rx from August of #1 tablet was processed at 90 day supply. Notified pt and she voices understanding.

## 2016-12-15 NOTE — Telephone Encounter (Signed)
Pt walked in to office stating most recent Rx of diflucan was only for 1 tablet. Reports that previous Rx was for #2 tablets.  Last Rx sent for #1 tablet x 2 refills to mail order (pt preference) and wants to know if 1 tablet is supposed to be a 3 month supply? Advised pt that we typically do not keep pt's on this medication ongoing. When pt's have symptoms we typically will do a test in the office to confirm yeast. Pt states she takes this medication whenever she has vaginal burning. Please advise?

## 2016-12-15 NOTE — Telephone Encounter (Signed)
Notified pt of below and she states that is not what she was wanting to know. States she was billed a copay for the diflucan Rx that she received in June. When she spoke with mail order they told her we sent rx as a 30 day supply and if we submit as a 90 day supply there is no co-pay on the prescription. States she received Rx from August and there was no copay for that Rx. Advised pt that we did not specify anywhere on the Rx if quantity was for a 30 or 90 day supply and this is a medication that is not something we can put a 90 day quantity on Pt wants [place] to get copy of Rx we sent in June to disprove what [facility] is telling her.

## 2016-12-15 NOTE — Telephone Encounter (Signed)
If no improvement after 1 tab we should see her back in the office for evaluation.

## 2016-12-21 ENCOUNTER — Telehealth: Payer: Self-pay | Admitting: *Deleted

## 2016-12-21 NOTE — Telephone Encounter (Signed)
Opened in error.SLS

## 2016-12-22 ENCOUNTER — Encounter: Payer: Self-pay | Admitting: Family

## 2016-12-23 ENCOUNTER — Other Ambulatory Visit: Payer: Self-pay | Admitting: Family

## 2016-12-25 ENCOUNTER — Encounter: Payer: Self-pay | Admitting: Family

## 2016-12-25 ENCOUNTER — Ambulatory Visit (INDEPENDENT_AMBULATORY_CARE_PROVIDER_SITE_OTHER): Payer: Medicare Other | Admitting: Family

## 2016-12-25 VITALS — BP 133/59 | HR 77 | Temp 98.1°F | Resp 16 | Ht 59.0 in | Wt 133.8 lb

## 2016-12-25 DIAGNOSIS — M810 Age-related osteoporosis without current pathological fracture: Secondary | ICD-10-CM

## 2016-12-25 DIAGNOSIS — Z0000 Encounter for general adult medical examination without abnormal findings: Secondary | ICD-10-CM | POA: Diagnosis not present

## 2016-12-25 DIAGNOSIS — E785 Hyperlipidemia, unspecified: Secondary | ICD-10-CM

## 2016-12-25 DIAGNOSIS — I10 Essential (primary) hypertension: Secondary | ICD-10-CM | POA: Diagnosis not present

## 2016-12-25 LAB — LIPID PANEL
CHOL/HDL RATIO: 2
CHOLESTEROL: 102 mg/dL (ref 0–200)
HDL: 47.2 mg/dL (ref >39.00)
LDL CALC: 39 mg/dL (ref 0–99)
NonHDL: 55.2
Triglycerides: 83 mg/dL (ref 0.0–149.0)
VLDL: 16.6 mg/dL (ref 0.0–40.0)

## 2016-12-25 LAB — COMPREHENSIVE METABOLIC PANEL
ALT: 14 U/L (ref 0–35)
AST: 16 U/L (ref 0–37)
Albumin: 4.1 g/dL (ref 3.5–5.2)
Alkaline Phosphatase: 50 U/L (ref 39–117)
BUN: 13 mg/dL (ref 6–23)
CHLORIDE: 101 meq/L (ref 96–112)
CO2: 30 meq/L (ref 19–32)
Calcium: 9.2 mg/dL (ref 8.4–10.5)
Creatinine, Ser: 0.65 mg/dL (ref 0.40–1.20)
GFR: 93.17 mL/min (ref >60.00)
GLUCOSE: 129 mg/dL — AB (ref 70–99)
POTASSIUM: 4.3 meq/L (ref 3.5–5.1)
SODIUM: 137 meq/L (ref 135–145)
Total Bilirubin: 1.1 mg/dL (ref 0.2–1.2)
Total Protein: 6.5 g/dL (ref 6.0–8.3)

## 2016-12-25 MED ORDER — ZOSTER VAC RECOMB ADJUVANTED 50 MCG/0.5ML IM SUSR
0.5000 mL | Freq: Once | INTRAMUSCULAR | 1 refills | Status: AC
Start: 2016-12-25 — End: 2016-12-25

## 2016-12-25 NOTE — Patient Instructions (Signed)
Please complete lab work prior to leaving. Continue healthy diet and regular exercise. Please see if your pharmacy has Shingles vaccine (shingrix) in stock. I have sent a prescription to your pharmacy.

## 2016-12-25 NOTE — Progress Notes (Signed)
Subjective:    Patient ID: Mary Flynn, female    DOB: December 09, 1934, 82 y.o.   MRN: [id number]  HPI  Mary Flynn is an 82 yr old female who presents today for cpx.  Immunizations: Td is due, prevnar/pneumovax up to date. Due for shingrix, flu shot up to date, declines tetanus Diet: vegetarian diet Exercise:  Regular, walks regularly (2 miles a day) Colonoscopy: 2009 Dexa: osteopenia- reports that she took fosamax for a long time but pulmonary stopped.   Pap Smear: hysterectomy Mammogram: 4/19  Wt Readings from Last 3 Encounters:  12/25/16 133 lb 12.8 oz (60.7 kg)  11/27/16 134 lb 6.4 oz (61 kg)  11/23/16 135 lb (61.2 kg)     Review of Systems  Constitutional: Negative for unexpected weight change.  HENT: Positive for rhinorrhea.   Respiratory:       Denies DOE.  Notes some mucous in her chest  Cardiovascular: Negative for leg swelling.  Gastrointestinal:       She does report + constipation, uses miralax prn  Musculoskeletal:       Some myalgias which she attributes to fibromyalgia  Neurological: Negative for headaches.  Hematological: Negative for adenopathy.  Psychiatric/Behavioral:       Denies depression   Past Medical History:  Diagnosis Date  . Diabetes mellitus   . Hyperlipidemia   . Hypertension   . Left rib fracture 02/18/2014     Social History   Social History  . Marital status: Married    Spouse name: Mary Flynn  . Number of children: 2  . Years of education: College   Occupational History  . housewife    Social History Main Topics  . Smoking status: Never Smoker  . Smokeless tobacco: Never Used  . Alcohol use No  . Drug use: No  . Sexual activity: Not Currently   Other Topics Concern  . Not on file   Social History Narrative   Patient lives at home with spouse.   Caffeine Use: rarely    Past Surgical History:  Procedure Laterality Date  . APPENDECTOMY    . REFRACTIVE SURGERY     Left Eye  . surgical sterilization  40 years ago     Family History  Problem Relation Age of Onset  . Diabetes Mother   . Hypertension Father   . Depression Unknown   . Cancer Unknown        laryngeal  . Diabetes Unknown     Allergies  Allergen Reactions  . Ace Inhibitors Cough  . Losartan Cough  . Penicillins Hives  . Sulfa Antibiotics Hives  . Sulfonamide Derivatives     Current Outpatient Prescriptions on File Prior to Visit  Medication Sig Dispense Refill  . albuterol (PROAIR HFA) 108 (90 Base) MCG/ACT inhaler Inhale 2 puffs into the lungs every 6 (six) hours as needed for wheezing or shortness of breath. 3 Inhaler 1  . amLODipine (NORVASC) 5 MG tablet TAKE 1 TABLET BY MOUTH  DAILY 90 tablet 0  . aspirin EC 81 MG tablet Take 81 mg by mouth daily.    Marland Kitchen atorvastatin (LIPITOR) 10 MG tablet TAKE 1 TABLET BY MOUTH  EVERY DAY 90 tablet 0  . b complex vitamins tablet Take 1 tablet by mouth daily.    . Blood Glucose Monitoring Suppl (ONE TOUCH ULTRA 2) w/Device KIT USE AS DIRECTED TO TEST  BLOOD SUGAR TWO TIMES DAILY 1 each 1  . cyclobenzaprine (FLEXERIL) 10 MG tablet TAKE 1 TABLET  BY MOUTH TWO  TIMES DAILY AS NEEDED FOR  MUSCLE SPASM(S) 180 tablet 0  . fluconazole (DIFLUCAN) 150 MG tablet Take 1 tablet by mouth once as needed for vaginal infection. 1 tablet 2  . fluticasone (FLONASE) 50 MCG/ACT nasal spray Place 2 sprays into both nostrils daily. 16 g 6  . gabapentin (NEURONTIN) 300 MG capsule Take 1 capsule (300 mg total) by mouth 3 (three) times daily. 270 capsule 1  . glimepiride (AMARYL) 4 MG tablet TAKE 1 TABLET BY MOUTH  TWICE A DAY 180 tablet 0  . glucose blood (ONE TOUCH ULTRA TEST) test strip Use to test blood sugar 2 times daily as instructed. Dx code: E11.49 200 each 3  . Insulin Detemir (LEVEMIR FLEXPEN) 100 UNIT/ML Pen Inject 12 Units into the skin at bedtime.  15 mL 3  . insulin lispro (HUMALOG KWIKPEN) 100 UNIT/ML KiwkPen Inject into the skin as needed. Inject 5-6 units at breakfast, 5 units at lunch    . Insulin  Pen Needle 32G X 4 MM MISC Use 3 per day 100 each 3  . ipratropium (ATROVENT) 0.03 % nasal spray Place 2 sprays into both nostrils every 12 (twelve) hours. 30 mL 12  . latanoprost (XALATAN) 0.005 % ophthalmic solution Place 1 drop into both eyes at bedtime.    Marland Kitchen levocetirizine (XYZAL) 5 MG tablet TAKE 1 TABLET BY MOUTH  EVERY EVENING 90 tablet 0  . levothyroxine (SYNTHROID, LEVOTHROID) 25 MCG tablet TAKE 1 TABLET BY MOUTH  DAILY BEFORE BREAKFAST 90 tablet 1  . meloxicam (MOBIC) 15 MG tablet Take 1 tablet (15 mg total) by mouth daily. 30 tablet 8  . metFORMIN (GLUCOPHAGE) 1000 MG tablet Take 1 tablet (1,000 mg total) by mouth 2 (two) times daily with a meal. 180 tablet 1  . metoprolol succinate (TOPROL-XL) 100 MG 24 hr tablet TAKE 1 TABLET BY MOUTH  DAILY WITH OR IMMEDIATLEY  FOLLOWING A MEAL 90 tablet 0  . niacin (NIASPAN) 500 MG CR tablet Take 1,000 mg by mouth at bedtime.    . NONFORMULARY OR COMPOUNDED ITEM Lumbar support belt  #1  As directed 1 each 0  . ONE TOUCH ULTRA TEST test strip TEST TWO TIMES DAILY 200 each 2  . pantoprazole (PROTONIX) 40 MG tablet TAKE 1 TABLET BY MOUTH  DAILY 90 tablet 0  . timolol (TIMOPTIC) 0.5 % ophthalmic solution Place 1 drop into both eyes every morning.    . valsartan (DIOVAN) 320 MG tablet TAKE 1 TABLET BY MOUTH  DAILY 90 tablet 1   No current facility-administered medications on file prior to visit.     BP (!) 133/59 (BP Location: Left Arm, Cuff Size: Normal)   Pulse 77   Temp 98.1 F (36.7 C) (Oral)   Resp 16   Ht _0  (1.499 m)   Wt 133 lb 12.8 oz (60.7 kg)   SpO2 97%   BMI 27.02 kg/m       Objective:   Physical Exam  Physical Exam  Constitutional: She is oriented to person, place, and time. She appears well-developed and well-nourished. No distress.  HENT:  Head: Normocephalic and atraumatic.  Right Ear: Tympanic membrane and ear canal normal.  Left Ear: Tympanic membrane and ear canal normal.  Mouth/Throat: Oropharynx is clear and  moist.  Eyes: Pupils are equal, round, and reactive to light. No scleral icterus.  Neck: Normal range of motion. No thyromegaly present.  Cardiovascular: Normal rate and regular rhythm.   No murmur heard. Pulmonary/Chest:  Effort normal and breath sounds normal. No respiratory distress. He has no wheezes. She has no rales. She exhibits no tenderness.  Abdominal: Soft. Bowel sounds are normal. She exhibits no distension and no mass. There is no tenderness. There is no rebound and no guarding.  Musculoskeletal: She exhibits no edema.  Lymphadenopathy:    She has no cervical adenopathy.  Neurological: She is alert and oriented to person, place, and time. She has normal patellar reflexes. She exhibits normal muscle tone. Coordination normal.  Skin: Skin is warm and dry.  Psychiatric: She has a normal mood and affect. Her behavior is normal. Judgment and thought content normal.  Breasts: Examined lying Right: Without masses, retractions, discharge or axillary adenopathy.  Left: Without masses, retractions, discharge or axillary adenopathy.  Pelvic: deferred          Assessment & Plan:   Preventative care- encouraged pt to continue healthy diet, regular exercise. Rx sent to her pharmacy for shingrix. Refer for dexa.  Colo and mammo up to date.  Follow up labs as ordered.       Assessment & Plan:

## 2016-12-29 ENCOUNTER — Ambulatory Visit (HOSPITAL_BASED_OUTPATIENT_CLINIC_OR_DEPARTMENT_OTHER)
Admission: RE | Admit: 2016-12-29 | Discharge: 2016-12-29 | Disposition: A | Discharge status: Home / Self Care | Payer: Medicare Other | Source: Ambulatory Visit | Attending: Family | Admitting: Family

## 2016-12-29 DIAGNOSIS — M810 Age-related osteoporosis without current pathological fracture: Secondary | ICD-10-CM | POA: Diagnosis not present

## 2016-12-29 DIAGNOSIS — Z13820 Encounter for screening for osteoporosis: Secondary | ICD-10-CM | POA: Diagnosis not present

## 2017-01-01 ENCOUNTER — Other Ambulatory Visit: Payer: Self-pay | Admitting: Family

## 2017-01-01 NOTE — Telephone Encounter (Signed)
Bone density shows osteoporosis. I see she has taken fosamax in the past.  I would recommend prolia injection if she is willing. Add caltrate 600mg  +D bid and ensure regular exercise.

## 2017-01-06 NOTE — Telephone Encounter (Signed)
Notified pt. She is agreeable to start prolia. She states that Dr Melvyn Novas had her to stop calcium with vit D and told her to take calcium citrate which she has been taking once a day. Wanted to confirm it was ok to go back on the Ca+ D?

## 2017-01-07 NOTE — Telephone Encounter (Signed)
Calcium citrate is OK to continue. Just make sure that there is vitamin D in it.

## 2017-01-08 MED ORDER — CALCIUM CITRATE-VITAMIN D 250-200 MG-UNIT PO TABS: 1.0000 | ORAL_TABLET | Freq: Every day | ORAL | Status: DC

## 2017-01-08 NOTE — Telephone Encounter (Signed)
Notified pt and she confirms Ca+ citrate does contain vitamin D. Pt is agreeable to proceed with Prolia. Message sent to initiate insurance verification.

## 2017-01-22 ENCOUNTER — Other Ambulatory Visit: Payer: Self-pay | Admitting: Family

## 2017-01-22 DIAGNOSIS — E1140 Type 2 diabetes mellitus with diabetic neuropathy, unspecified: Secondary | ICD-10-CM

## 2017-01-22 DIAGNOSIS — I10 Essential (primary) hypertension: Secondary | ICD-10-CM

## 2017-01-22 DIAGNOSIS — E785 Hyperlipidemia, unspecified: Secondary | ICD-10-CM

## 2017-01-22 DIAGNOSIS — K219 Gastro-esophageal reflux disease without esophagitis: Secondary | ICD-10-CM

## 2017-01-26 ENCOUNTER — Telehealth: Payer: Self-pay | Admitting: Family

## 2017-01-26 NOTE — Telephone Encounter (Signed)
Prolia benefits received PA NOT required Prolia 20% co-insurance Admin $50 copay  Patient may owe approximately $44 OOP   Letter mailed to inform patient of benefits and to schedule

## 2017-01-27 ENCOUNTER — Other Ambulatory Visit: Payer: Self-pay

## 2017-02-01 ENCOUNTER — Ambulatory Visit: Payer: Self-pay | Admitting: Endocrinology

## 2017-02-04 ENCOUNTER — Other Ambulatory Visit (INDEPENDENT_AMBULATORY_CARE_PROVIDER_SITE_OTHER): Payer: Medicare Other

## 2017-02-04 DIAGNOSIS — Z794 Long term (current) use of insulin: Secondary | ICD-10-CM

## 2017-02-04 DIAGNOSIS — E1142 Type 2 diabetes mellitus with diabetic polyneuropathy: Secondary | ICD-10-CM

## 2017-02-04 DIAGNOSIS — E1165 Type 2 diabetes mellitus with hyperglycemia: Secondary | ICD-10-CM | POA: Diagnosis not present

## 2017-02-04 LAB — LIPID PANEL
CHOL/HDL RATIO: 2
Cholesterol: 103 mg/dL (ref 0–200)
HDL: 50 mg/dL (ref >39.00)
LDL CALC: 35 mg/dL (ref 0–99)
NonHDL: 52.91
TRIGLYCERIDES: 90 mg/dL (ref 0.0–149.0)
VLDL: 18 mg/dL (ref 0.0–40.0)

## 2017-02-04 LAB — COMPREHENSIVE METABOLIC PANEL
ALBUMIN: 4.3 g/dL (ref 3.5–5.2)
ALK PHOS: 58 U/L (ref 39–117)
ALT: 13 U/L (ref 0–35)
AST: 18 U/L (ref 0–37)
BILIRUBIN TOTAL: 1 mg/dL (ref 0.2–1.2)
BUN: 13 mg/dL (ref 6–23)
CALCIUM: 9.5 mg/dL (ref 8.4–10.5)
CO2: 30 meq/L (ref 19–32)
Chloride: 98 mEq/L (ref 96–112)
Creatinine, Ser: 0.71 mg/dL (ref 0.40–1.20)
GFR: 84.12 mL/min (ref >60.00)
Glucose, Bld: 172 mg/dL — ABNORMAL HIGH (ref 70–99)
Potassium: 4.1 mEq/L (ref 3.5–5.1)
Sodium: 137 mEq/L (ref 135–145)
Total Protein: 7.1 g/dL (ref 6.0–8.3)

## 2017-02-04 LAB — MICROALBUMIN / CREATININE URINE RATIO
CREATININE, U: 44.9 mg/dL
Microalb Creat Ratio: 3.3 mg/g (ref 0.0–30.0)
Microalb, Ur: 1.5 mg/dL (ref 0.0–1.9)

## 2017-02-04 LAB — TSH: TSH: 2.28 u[IU]/mL (ref 0.35–4.50)

## 2017-02-05 ENCOUNTER — Other Ambulatory Visit: Payer: Self-pay

## 2017-02-05 ENCOUNTER — Encounter: Payer: Self-pay | Admitting: Endocrinology

## 2017-02-05 ENCOUNTER — Ambulatory Visit (INDEPENDENT_AMBULATORY_CARE_PROVIDER_SITE_OTHER): Payer: Medicare Other | Admitting: Endocrinology

## 2017-02-05 VITALS — BP 136/80 | HR 92 | Ht 59.0 in | Wt 133.8 lb

## 2017-02-05 DIAGNOSIS — E063 Autoimmune thyroiditis: Secondary | ICD-10-CM | POA: Diagnosis not present

## 2017-02-05 DIAGNOSIS — Z794 Long term (current) use of insulin: Secondary | ICD-10-CM

## 2017-02-05 DIAGNOSIS — E1165 Type 2 diabetes mellitus with hyperglycemia: Secondary | ICD-10-CM

## 2017-02-05 LAB — FRUCTOSAMINE: Fructosamine: 334 umol/L — ABNORMAL HIGH (ref 0–285)

## 2017-02-05 MED ORDER — GLUCOSE BLOOD VI STRP: ORAL_STRIP | 3 refills | Status: DC

## 2017-02-05 NOTE — Patient Instructions (Addendum)
Must take 6-8 Humalog at dinner and make sure bedtime sugar is under 180  Take Humalog for every meal   Extra 1-2 units of Humalog if eating some sweets snacks  Check blood sugars on waking up 3-4/7    Also check blood sugars about 2 hours after a meal and do this after different meals by rotation  Recommended blood sugar levels on waking up is 90-130 and about 2 hours after meal is 130-160  Please bring your blood sugar monitor to each visit, thank you

## 2017-02-05 NOTE — Progress Notes (Signed)
Patient ID: Mary Flynn, female   DOB: 1934-11-20, 82 y.o.   MRN: [id number]   Reason for Appointment: Diabetes and general follow-up   History of Present Illness   Diagnosis: Type 2 DIABETES MELITUS, date of diagnosis:   1995    Previous history: She has been on various regimens of hypoglycemic drugs over the last several years including Actos, Amaryl and Januvia. Because of cost her Januvia was stopped and Actos was stopped because of potential weight gain She was initially started on Levemir insulin because of fasting hyperglycemia in 2009 and the dose has been adjusted periodically In the last year she is also quite mealtime coverage because of postprandial hyperglycemia despite usually eating a low carbohydrate diet   Recent history:   Insulin regimen: Levemir 14-16 units hs;  Humalog 6-6 pc  Brekfast and lunch Oral hypoglycemic drugs: Amaryl 2 mg twice a day, metformin, Invokana '100mg'$  in am   Her last A1c was 7.6 but now her fructosamine is high at 334  Current blood sugar patterns and problems identified:  She has been taking Invokana recently although not clear if she has been regular with this, she is not getting this from [place] because of the cost.  She is concerned about the safety of the drug as publicized on the media  More recently has been using expired test strips for her One Touch meter which is relatively old also  FASTING blood sugars are still somewhat variable  She now reveals that she is not taking any Humalog to cover her evening meal since she did not know she could take it in the evening when she is already taking Levemir  She will also drink a skim milk glass at bedtime daily  She forgets to check her sugars AFTER meals and only occasionally checks blood sugars after breakfast and rarely after lunch  Blood sugars are generally lower after breakfast  For some reason she is taking as much as 10 units of Humalog at breakfast and lunch,  previously taking only about 6 units  No hypoglycemia reported recently  Hypoglycemia :  none documented but she feels some weakness when blood sugars are in the 70s and 80s    Side effects from medications: None.                Monitors blood glucose: Once  a day.    Glucometer:  One Touch ultra 2        Blood Glucose readings from meter download:   Mean values apply above for all meters except median for One Touch  PRE-MEAL Fasting Lunch Dinner Bedtime Overall  Glucose range:  81-224  89, 156      Mean/median:     133   POST-MEAL PC Breakfast PC Lunch PC Dinner  Glucose range: 72-1 59     Mean/median:        Meals: 3 meals per day. she is a vegetarian. Trying to get some protein at most meals including dairy products and tofu  Has breakfast at 10 am,  lunch around 2 PM, dinner at 8 PM       Physical activity: exercise:   Walking 2 miles     Last visit with dietitian: 2009   Wt Readings from Last 3 Encounters:  02/05/17 133 lb 12.8 oz (60.7 kg)  12/25/16 133 lb 12.8 oz (60.7 kg)  11/27/16 134 lb 6.4 oz (61 kg)    LABS:  Lab Results  Component Value Date  HGBA1C 7.6 11/27/2016   HGBA1C 7.6 (H) 08/07/2016   HGBA1C 7.6 (H) 07/15/2016   Lab Results  Component Value Date   MICROALBUR 1.5 02/04/2017   LDLCALC 35 02/04/2017   CREATININE 0.71 02/04/2017    Other issues discussed today: See review of systems   Allergies as of 02/05/2017      Reactions   Ace Inhibitors Cough   Losartan Cough   Penicillins Hives   Sulfa Antibiotics Hives   Sulfonamide Derivatives       Medication List        Accurate as of 02/05/17 10:51 AM. Always use your most recent med list.          albuterol 108 (90 Base) MCG/ACT inhaler Commonly known as:  PROAIR HFA Inhale 2 puffs into the lungs every 6 (six) hours as needed for wheezing or shortness of breath.   amLODipine 5 MG tablet Commonly known as:  NORVASC TAKE 1 TABLET BY MOUTH  DAILY   aspirin EC 81 MG  tablet Take 81 mg by mouth daily.   atorvastatin 10 MG tablet Commonly known as:  LIPITOR TAKE 1 TABLET BY MOUTH  EVERY DAY   b complex vitamins tablet Take 1 tablet by mouth daily.   Calcium Citrate-Vitamin D 250-200 MG-UNIT Tabs Commonly known as:  CALCIUM CITRATE + D Take 1 tablet daily by mouth.   cyclobenzaprine 10 MG tablet Commonly known as:  FLEXERIL TAKE 1 TABLET BY MOUTH TWO  TIMES DAILY AS NEEDED FOR  MUSCLE SPASM(S)   fluconazole 150 MG tablet Commonly known as:  DIFLUCAN Take 1 tablet by mouth once as needed for vaginal infection.   fluticasone 50 MCG/ACT nasal spray Commonly known as:  FLONASE Place 2 sprays into both nostrils daily.   gabapentin 300 MG capsule Commonly known as:  NEURONTIN Take 1 capsule (300 mg total) by mouth 3 (three) times daily.   glimepiride 4 MG tablet Commonly known as:  AMARYL TAKE 1 TABLET BY MOUTH  TWICE A DAY   glucose blood test strip Commonly known as:  ONE TOUCH ULTRA TEST Use to test blood sugar 2 times daily as instructed. Dx code: E11.49   ONE TOUCH ULTRA TEST test strip Generic drug:  glucose blood TEST TWO TIMES DAILY   glucose blood test strip Commonly known as:  ONETOUCH VERIO Use to test blood sugars two times daily.   HUMALOG KWIKPEN 100 UNIT/ML KiwkPen Generic drug:  insulin lispro Inject into the skin as needed. Inject 8-10 units at breakfast, 8-10 units at lunch   Insulin Pen Needle 32G X 4 MM Misc Use 3 per day   ipratropium 0.03 % nasal spray Commonly known as:  ATROVENT Place 2 sprays into both nostrils every 12 (twelve) hours.   latanoprost 0.005 % ophthalmic solution Commonly known as:  XALATAN Place 1 drop into both eyes at bedtime.   LEVEMIR FLEXPEN 100 UNIT/ML Pen Generic drug:  Insulin Detemir Inject 12 Units into the skin at bedtime.   levocetirizine 5 MG tablet Commonly known as:  XYZAL TAKE 1 TABLET BY MOUTH  EVERY EVENING   levothyroxine 25 MCG tablet Commonly known as:   SYNTHROID, LEVOTHROID TAKE 1 TABLET BY MOUTH  DAILY BEFORE BREAKFAST   meloxicam 15 MG tablet Commonly known as:  MOBIC Take 1 tablet (15 mg total) by mouth daily.   metFORMIN 1000 MG tablet Commonly known as:  GLUCOPHAGE Take 1 tablet (1,000 mg total) by mouth 2 (two) times daily with a meal.  metoprolol succinate 100 MG 24 hr tablet Commonly known as:  TOPROL-XL TAKE 1 TABLET BY MOUTH  DAILY WITH OR IMMEDIATELY  FOLLOWING A MEAL   niacin 500 MG CR tablet Commonly known as:  NIASPAN Take 1,000 mg by mouth at bedtime.   NONFORMULARY OR COMPOUNDED ITEM Lumbar support belt  #1  As directed   ONE TOUCH ULTRA 2 w/Device Kit USE AS DIRECTED TO TEST  BLOOD SUGAR TWO TIMES DAILY   pantoprazole 40 MG tablet Commonly known as:  PROTONIX TAKE 1 TABLET BY MOUTH  DAILY   timolol 0.5 % ophthalmic solution Commonly known as:  TIMOPTIC Place 1 drop into both eyes every morning.   valsartan 320 MG tablet Commonly known as:  DIOVAN TAKE 1 TABLET BY MOUTH  DAILY       Allergies:  Allergies  Allergen Reactions  . Ace Inhibitors Cough  . Losartan Cough  . Penicillins Hives  . Sulfa Antibiotics Hives  . Sulfonamide Derivatives     Past Medical History:  Diagnosis Date  . Diabetes mellitus   . Hyperlipidemia   . Hypertension   . Left rib fracture 02/18/2014    Past Surgical History:  Procedure Laterality Date  . APPENDECTOMY    . REFRACTIVE SURGERY     Left Eye  . surgical sterilization  40 years ago    Family History  Problem Relation Age of Onset  . Diabetes Mother   . Hypertension Father   . Depression Unknown   . Cancer Unknown        laryngeal  . Diabetes Unknown     Social History:  reports that  has never smoked. she has never used smokeless tobacco. She reports that she does not drink alcohol or use drugs.  Review of Systems:   HYPOTHYROIDISM: Currently on 25 g of levothyroxine   Lab Results  Component Value Date   TSH 2.28 02/04/2017   TSH  1.64 11/23/2016   TSH 3.81 07/15/2016   FREET4 1.06 06/19/2016   FREET4 1.12 06/10/2016   FREET4 0.93 10/17/2014     Hypertension:    She does not monitor at home regularly and blood pressure is followed by PCP  BP Readings from Last 3 Encounters:  02/05/17 136/80  12/25/16 (!) 133/59  11/27/16 120/68     Lab Results  Component Value Date   CREATININE 0.71 02/04/2017   BUN 13 02/04/2017   NA 137 02/04/2017   K 4.1 02/04/2017   CL 98 02/04/2017   CO2 30 02/04/2017    Lipids: Has had diabetic dyslipidemia,  well controlled as of 2017   Lab Results  Component Value Date   CHOL 103 02/04/2017   HDL 50.00 02/04/2017   LDLCALC 35 02/04/2017   TRIG 90.0 02/04/2017   CHOLHDL 2 02/04/2017     Diabetic foot exam done in 01/2017  Diabetic foot exam showed normal monofilament sensation in the toes and plantar surfaces, no skin lesions or ulcers on the feet and normal pedal pulses       Examination:   BP 136/80   Pulse 92   Ht '4\' 11"'$  (1.499 m)   Wt 133 lb 12.8 oz (60.7 kg)   SpO2 96%   BMI 27.02 kg/m   Body mass index is 27.02 kg/m.   Diabetic Foot Exam - Simple   Simple Foot Form Diabetic Foot exam was performed with the following findings:  Yes   Visual Inspection No deformities, no ulcerations, no other skin breakdown bilaterally:  Yes Sensation Testing Intact to touch and monofilament testing bilaterally:  Yes Pulse Check Posterior Tibialis and Dorsalis pulse intact bilaterally:  Yes Comments Dryness of the skin present      ASSESSMENT/ PLAN:   Diabetes type 2  With BMI 26  See history of present illness for detailed discussion of  current management, blood sugar patterns and problems identified  She is on basal bolus insulin regimen but taking mealtime insulin only at breakfast and lunch Also on Invokana, Amaryl and metformin  She is still not understanding the need to monitor readings after meals since this is likely where she is getting  high readings especially after supper when she is not covering the meal that has carbohydrates Also using expired test strips which are probably reading falsely low Unclear what her mealtime insulin requirement should be but she is using relatively high doses of Humalog at breakfast and lunch and none at suppertime  Recommendations:   She was reassured about the safety of Invokana and need to continue this, also her renal function is stable with this  She does need to start checking her sugars after meals and discussed that her high fructosamine is indicating need for better control after meals especially suppertime  She was explained the need to cover all meals with carbohydrate regardless of whether she is taking other medications or basal insulin  She will need to add more protein consistently to all meals  Most likely she will need at least 6 units to cover her evening meal and discussed that her blood sugar target at bedtime is 180  She does not need to take 10 units of insulin for breakfast and she can cut down to at least 8  If her morning sugars continue to come down with suppertime insulin coverage she can try to cut back on her Levemir again  Consider doing CGM professional portion to evaluate her mealtime control  She will avoid drinking skim milk at bedtime  A1c on the next visit  HYPOTHYROIDISM: Adequately controlled with only 25 g which has been prescribed by her PCP  HYPERTENSION: This is probably better controlled with using Invokana and will continue    Patient Instructions  Must take 6-8 Humalog at dinner and make sure bedtime sugar is under 180  Take Humalog for every meal   Extra 1-2 units of Humalog if eating some sweets snacks  Check blood sugars on waking up 3-4/7    Also check blood sugars about 2 hours after a meal and do this after different meals by rotation  Recommended blood sugar levels on waking up is 90-130 and about 2 hours after meal is  130-160  Please bring your blood sugar monitor to each visit, thank you     Counseling time on subjects discussed in assessment and plan sections is over 50% of today's 25 minute visit   Elayne Snare 02/05/2017, 10:51 AM

## 2017-02-06 ENCOUNTER — Other Ambulatory Visit: Payer: Self-pay | Admitting: Family

## 2017-02-24 ENCOUNTER — Other Ambulatory Visit: Payer: Self-pay | Admitting: Family

## 2017-02-24 DIAGNOSIS — E1140 Type 2 diabetes mellitus with diabetic neuropathy, unspecified: Secondary | ICD-10-CM

## 2017-02-26 LAB — HM DIABETES EYE EXAM

## 2017-02-26 MED ORDER — METFORMIN HCL 1000 MG PO TABS: 1000.0000 mg | ORAL_TABLET | Freq: Two times a day (BID) | ORAL | 1 refills | Status: DC

## 2017-02-26 NOTE — Addendum Note (Signed)
Addended by: Kelle Darting A on: 02/26/2017 02:51 PM   Modules accepted: Orders

## 2017-02-26 NOTE — Telephone Encounter (Signed)
Received fax dated 02/24/17 requesting refill of levocetirizine and metformin. Levocetirizine was sent on 02/24/17. Metformin refill has been sent today.

## 2017-03-12 ENCOUNTER — Encounter: Payer: Self-pay | Admitting: Family

## 2017-03-17 NOTE — Telephone Encounter (Signed)
Prolia benefits received PA not required 20% co-insurance for Prolia $20 copay for Admin   Patient may owe approximately $230  OOP   Letter mailed to inform patient of benefits and to schedule

## 2017-03-29 NOTE — Telephone Encounter (Signed)
CRM created for Prolia.

## 2017-04-05 ENCOUNTER — Other Ambulatory Visit (INDEPENDENT_AMBULATORY_CARE_PROVIDER_SITE_OTHER): Payer: Medicare Other

## 2017-04-05 DIAGNOSIS — E063 Autoimmune thyroiditis: Secondary | ICD-10-CM

## 2017-04-05 DIAGNOSIS — E1165 Type 2 diabetes mellitus with hyperglycemia: Secondary | ICD-10-CM | POA: Diagnosis not present

## 2017-04-05 DIAGNOSIS — Z794 Long term (current) use of insulin: Secondary | ICD-10-CM | POA: Diagnosis not present

## 2017-04-05 LAB — COMPREHENSIVE METABOLIC PANEL
ALK PHOS: 52 U/L (ref 39–117)
ALT: 17 U/L (ref 0–35)
AST: 20 U/L (ref 0–37)
Albumin: 4 g/dL (ref 3.5–5.2)
BUN: 16 mg/dL (ref 6–23)
CALCIUM: 9.3 mg/dL (ref 8.4–10.5)
CHLORIDE: 102 meq/L (ref 96–112)
CO2: 27 mEq/L (ref 19–32)
CREATININE: 0.65 mg/dL (ref 0.40–1.20)
GFR: 93.1 mL/min (ref >60.00)
Glucose, Bld: 147 mg/dL — ABNORMAL HIGH (ref 70–99)
POTASSIUM: 4.4 meq/L (ref 3.5–5.1)
SODIUM: 139 meq/L (ref 135–145)
TOTAL PROTEIN: 6.5 g/dL (ref 6.0–8.3)
Total Bilirubin: 0.8 mg/dL (ref 0.2–1.2)

## 2017-04-05 LAB — HEMOGLOBIN A1C: Hgb A1c MFr Bld: 7.7 % — ABNORMAL HIGH (ref 4.6–6.5)

## 2017-04-05 LAB — TSH: TSH: 4.33 u[IU]/mL (ref 0.35–4.50)

## 2017-04-08 ENCOUNTER — Ambulatory Visit (INDEPENDENT_AMBULATORY_CARE_PROVIDER_SITE_OTHER): Payer: Medicare Other | Admitting: Endocrinology

## 2017-04-08 ENCOUNTER — Encounter: Payer: Self-pay | Admitting: Endocrinology

## 2017-04-08 VITALS — BP 126/74 | HR 83 | Ht 59.0 in | Wt 132.2 lb

## 2017-04-08 DIAGNOSIS — I10 Essential (primary) hypertension: Secondary | ICD-10-CM

## 2017-04-08 DIAGNOSIS — Z794 Long term (current) use of insulin: Secondary | ICD-10-CM | POA: Diagnosis not present

## 2017-04-08 DIAGNOSIS — E1165 Type 2 diabetes mellitus with hyperglycemia: Secondary | ICD-10-CM

## 2017-04-08 DIAGNOSIS — E063 Autoimmune thyroiditis: Secondary | ICD-10-CM

## 2017-04-08 NOTE — Progress Notes (Signed)
Patient ID: Mary Flynn, female   DOB: [birth year], 83 y.o.   MRN: [id number]   Reason for Appointment: Diabetes and general follow-up   History of Present Illness   Diagnosis: Type 2 DIABETES MELITUS, date of diagnosis:   1995    Previous history: She has been on various regimens of hypoglycemic drugs over the last several years including Actos, Amaryl and Januvia. Because of cost her Januvia was stopped and Actos was stopped because of potential weight gain She was initially started on Levemir insulin because of fasting hyperglycemia in 2009 and the dose has been adjusted periodically In the last year she is also quite mealtime coverage because of postprandial hyperglycemia despite usually eating a low carbohydrate diet   Recent history:   Insulin regimen: Levemir 14 units hs;  Humalog 8 at breakfast, 8 at lunch and 4-5 acs Oral hypoglycemic drugs: Amaryl 2 mg twice a day, metformin, Invokana 131m in am   Her last A1c was 7.6 and now 7. 7  Current blood sugar patterns and problems identified:  She has been taking Invokana but [place]  She has been repeatedly advised to check blood sugars after meals more consistently but she is only taking sporadic readings at night after dinner  Blood sugars after dinner are mostly high  Despite overeating relatively smaller meals at breakfast if she is still taking 8 units of Humalog but less with her evening meal  She states she has episodes of hypoglycemia before lunch sometimes and she calls them blackouts although no documented low blood sugars available  May have had a relatively low blood sugar overnight once with glucose of 79 but no other nocturnal hypoglycemia  However FASTING blood sugars are more stable and excellent overall  She is trying to do as much walking as possible and her weight slightly better  She has been asked to add protein to every meal but does  not do so consistently  Hypoglycemia :  none documented but she feels some weakness when blood sugars are in the 70s and 80s    Side effects from medications: None.                Monitors blood glucose: Once  a day.    Glucometer:  One Touch ultra 2        Blood Glucose readings from meter download:   Mean values apply above for all meters except median for One Touch  PRE-MEAL Fasting Lunch Dinner Bedtime Overall  Glucose range: 100-152  73-128  91 99-346    Mean/median: 118    200  118   POST-MEAL PC Breakfast PC Lunch PC Dinner  Glucose range: ?  73-128     Mean/median:        Meals: 3 meals per day she is a vegetarian. Trying to get some protein at most meals including dairy products and tofu  Has breakfast at 10 am,  lunch around 2 PM, dinner at 8 PM       Physical activity: exercise:   Walking up to 2 miles, weather permitting     Last visit with dietitian: 2009   Wt Readings from Last 3 Encounters:  04/08/17 132 lb 3.2 oz (60 kg)  02/05/17 133 lb 12.8 oz (60.7 kg)  12/25/16 133 lb 12.8 oz (60.7 kg)    LABS:  Lab Results  Component Value Date   HGBA1C 7.7 (H) 04/05/2017  HGBA1C 7.6 11/27/2016   HGBA1C 7.6 (H) 08/07/2016   Lab Results  Component Value Date   MICROALBUR 1.5 02/04/2017   LDLCALC 35 02/04/2017   CREATININE 0.65 04/05/2017    Other issues discussed today: See review of systems   Allergies as of 04/08/2017      Reactions   Ace Inhibitors Cough   Losartan Cough   Penicillins Hives   Sulfa Antibiotics Hives   Sulfonamide Derivatives       Medication List        Accurate as of 04/08/17  9:01 PM. Always use your most recent med list.          albuterol 108 (90 Base) MCG/ACT inhaler Commonly known as:  PROAIR HFA Inhale 2 puffs into the lungs every 6 (six) hours as needed for wheezing or shortness of breath.   amLODipine 5 MG tablet Commonly known as:  NORVASC TAKE 1 TABLET BY MOUTH  DAILY   aspirin EC 81 MG tablet Take 81 mg  by mouth daily.   atorvastatin 10 MG tablet Commonly known as:  LIPITOR TAKE 1 TABLET BY MOUTH  EVERY DAY   b complex vitamins tablet Take 1 tablet by mouth daily.   Calcium Citrate-Vitamin D 250-200 MG-UNIT Tabs Commonly known as:  CALCIUM CITRATE + D Take 1 tablet daily by mouth.   cyclobenzaprine 10 MG tablet Commonly known as:  FLEXERIL TAKE 1 TABLET BY MOUTH TWO  TIMES DAILY AS NEEDED FOR  MUSCLE SPASM(S)   fluconazole 150 MG tablet Commonly known as:  DIFLUCAN Take 1 tablet by mouth once as needed for vaginal infection.   fluticasone 50 MCG/ACT nasal spray Commonly known as:  FLONASE Place 2 sprays into both nostrils daily.   gabapentin 300 MG capsule Commonly known as:  NEURONTIN Take 1 capsule (300 mg total) by mouth 3 (three) times daily.   glimepiride 4 MG tablet Commonly known as:  AMARYL TAKE 1 TABLET BY MOUTH  TWICE A DAY   glucose blood test strip Commonly known as:  ONE TOUCH ULTRA TEST Use to test blood sugar 2 times daily as instructed. Dx code: E11.49   ONE TOUCH ULTRA TEST test strip Generic drug:  glucose blood TEST TWO TIMES DAILY   glucose blood test strip Commonly known as:  ONETOUCH VERIO Use to test blood sugars two times daily.   HUMALOG KWIKPEN 100 UNIT/ML KiwkPen Generic drug:  insulin lispro Inject into the skin as needed. Inject 8-10 units at breakfast, 8-10 units at lunch   Insulin Pen Needle 32G X 4 MM Misc Use 3 per day   ipratropium 0.03 % nasal spray Commonly known as:  ATROVENT Place 2 sprays into both nostrils every 12 (twelve) hours.   latanoprost 0.005 % ophthalmic solution Commonly known as:  XALATAN Place 1 drop into both eyes at bedtime.   LEVEMIR FLEXPEN 100 UNIT/ML Pen Generic drug:  Insulin Detemir Inject 12 Units into the skin at bedtime.   levocetirizine 5 MG tablet Commonly known as:  XYZAL TAKE 1 TABLET BY MOUTH  EVERY EVENING   levothyroxine 25 MCG tablet Commonly known as:  SYNTHROID,  LEVOTHROID TAKE 1 TABLET BY MOUTH  DAILY BEFORE BREAKFAST   meloxicam 15 MG tablet Commonly known as:  MOBIC Take 1 tablet (15 mg total) by mouth daily.   metFORMIN 1000 MG tablet Commonly known as:  GLUCOPHAGE Take 1 tablet (1,000 mg total) by mouth 2 (two) times daily with a meal.   metoprolol succinate 100 MG  24 hr tablet Commonly known as:  TOPROL-XL TAKE 1 TABLET BY MOUTH  DAILY WITH OR IMMEDIATELY  FOLLOWING A MEAL   niacin 500 MG CR tablet Commonly known as:  NIASPAN Take 1,000 mg by mouth at bedtime.   NONFORMULARY OR COMPOUNDED ITEM Lumbar support belt  #1  As directed   ONE TOUCH ULTRA 2 w/Device Kit USE AS DIRECTED TO TEST  BLOOD SUGAR TWO TIMES DAILY   pantoprazole 40 MG tablet Commonly known as:  PROTONIX TAKE 1 TABLET BY MOUTH  DAILY   timolol 0.5 % ophthalmic solution Commonly known as:  TIMOPTIC Place 1 drop into both eyes every morning.   valsartan 320 MG tablet Commonly known as:  DIOVAN TAKE 1 TABLET BY MOUTH  DAILY       Allergies:  Allergies  Allergen Reactions  . Ace Inhibitors Cough  . Losartan Cough  . Penicillins Hives  . Sulfa Antibiotics Hives  . Sulfonamide Derivatives     Past Medical History:  Diagnosis Date  . Diabetes mellitus   . Hyperlipidemia   . Hypertension   . Left rib fracture 02/18/2014    Past Surgical History:  Procedure Laterality Date  . APPENDECTOMY    . REFRACTIVE SURGERY     Left Eye  . surgical sterilization  40 years ago    Family History  Problem Relation Age of Onset  . Diabetes Mother   . Hypertension Father   . Depression Unknown   . Cancer Unknown        laryngeal  . Diabetes Unknown     Social History:  reports that  has never smoked. she has never used smokeless tobacco. She reports that she does not drink alcohol or use drugs.  Review of Systems:   HYPOTHYROIDISM:  She is has been mild and not clear if she had symptoms except mild hair loss Has been started on treatment by PCP  and she feels fairly good now with no unusual fatigue or hair loss  Currently on 25 g of levothyroxine TSH is 4.3 now, previously lower  Lab Results  Component Value Date   TSH 4.33 04/05/2017   TSH 2.28 02/04/2017   TSH 1.64 11/23/2016   FREET4 1.06 06/19/2016   FREET4 1.12 06/10/2016   FREET4 0.93 10/17/2014     Hypertension:    Well controlled with valsartan, blood pressure is followed by PCP  BP Readings from Last 3 Encounters:  04/08/17 126/74  02/05/17 136/80  12/25/16 (!) 133/59     Lab Results  Component Value Date   CREATININE 0.65 04/05/2017   BUN 16 04/05/2017   NA 139 04/05/2017   K 4.4 04/05/2017   CL 102 04/05/2017   CO2 27 04/05/2017    Lipids: Has had diabetic dyslipidemia,  well controlled and followed by her PCP   Lab Results  Component Value Date   CHOL 103 02/04/2017   HDL 50.00 02/04/2017   LDLCALC 35 02/04/2017   TRIG 90.0 02/04/2017   CHOLHDL 2 02/04/2017     Diabetic foot exam done in 01/2017  Diabetic foot exam showed normal monofilament sensation in the toes and plantar surfaces, no skin lesions or ulcers on the feet and normal pedal pulses       Examination:   BP 126/74 (BP Location: Left Arm, Patient Position: Sitting, Cuff Size: Normal)   Pulse 83   Ht 4' 11" (1.499 m)   Wt 132 lb 3.2 oz (60 kg)   SpO2 96%  BMI 26.70 kg/m   Body mass index is 26.7 kg/m.     ASSESSMENT/ PLAN:   Diabetes type 2  With BMI 26  See history of present illness for detailed discussion of  current management, blood sugar patterns and problems identified  Her A1c is consistently around 7.6  She is on basal bolus insulin regimen with Levemir at bedtime  Also on Invokana, Amaryl and metformin Likely benefiting from [facility] which she has been able to get more regularly now although not clear if the [place] are [place]  She is still not able to get her blood sugars to target after meals especially in the evenings with taking  less coverage at dinnertime However she is probably getting too much insulin at breakfast since blood sugars tend to be low before lunch She does not check after meals especially breakfast and lunch unless she is having symptoms of low sugar Also can still do better with adding protein to each meal consistently   Recommendations:   She was advised to increase her Humalog at dinnertime since she is eating significant amount of carbohydrate at that time and her blood sugars will be as high as 342 at night  She probably needs up to 8 units but will depend on her meal size and carbohydrate  Also she needs to adjust her lunchtime dose based on portions and carbohydrates  Most likely needs only 6 units at breakfast for coverage  Since she has had low normal readings overnight and will get more Humalog at suppertime she can reduce Levemir by 2 units also  Stay on [place] as renal function is stable  HYPOTHYROIDISM: Adequately controlled with only 25 g which has been prescribed by her PCP Not clear if she had significant specific symptoms at baseline but she is doing well despite TSH of 4.3 which is adequate for her age  HYPERTENSION: This is better controlled with using Invokana in addition to her valsartan and will continue  Counseling time on subjects discussed in assessment and plan sections is over 50% of today's 25 minute visit   Patient Instructions  HUMALOG 6 AT BFST; 8 UNITS AT LUNCH AND 7-8 AT [facility] REDUCE FOR SMALLER meals  Levemir 12         04/08/2017, 9:01 PM

## 2017-04-08 NOTE — Patient Instructions (Signed)
HUMALOG 6 AT BFST; 8 UNITS AT LUNCH AND 7-8 AT DINNER REDUCE FOR SMALLER meals  Levemir 12

## 2017-04-10 ENCOUNTER — Other Ambulatory Visit: Payer: Self-pay | Admitting: Family

## 2017-05-17 ENCOUNTER — Ambulatory Visit (INDEPENDENT_AMBULATORY_CARE_PROVIDER_SITE_OTHER): Payer: Medicare Other | Admitting: Family

## 2017-05-17 ENCOUNTER — Encounter: Payer: Self-pay | Admitting: Family

## 2017-05-17 VITALS — BP 142/64 | HR 87 | Temp 97.9°F | Resp 16 | Ht 59.0 in | Wt 132.8 lb

## 2017-05-17 DIAGNOSIS — R2681 Unsteadiness on feet: Secondary | ICD-10-CM | POA: Diagnosis not present

## 2017-05-17 DIAGNOSIS — M2550 Pain in unspecified joint: Secondary | ICD-10-CM

## 2017-05-17 DIAGNOSIS — R682 Dry mouth, unspecified: Secondary | ICD-10-CM | POA: Diagnosis not present

## 2017-05-17 DIAGNOSIS — M797 Fibromyalgia: Secondary | ICD-10-CM | POA: Diagnosis not present

## 2017-05-17 LAB — SEDIMENTATION RATE: SED RATE: 4 mm/h (ref 0–30)

## 2017-05-17 MED ORDER — DULOXETINE HCL 30 MG PO CPEP: 30.0000 mg | ORAL_CAPSULE | Freq: Every day | ORAL | 3 refills | Status: DC

## 2017-05-17 MED FILL — DULoxetine HCL 30 MG CPEP: 30 | 30 days supply | Qty: 30 | Fill #0

## 2017-05-17 NOTE — Patient Instructions (Addendum)
Stop tramadol. Begin cymbalta for fibromyalgia pain. You may also use tylenol as needed for pain.  You will be contacted about scheduling your physical therapy.  Complete lab work prior to leaving.

## 2017-05-17 NOTE — Progress Notes (Signed)
Subjective:    Patient ID: Mary Flynn, female    DOB: Jul 18, 1934, 83 y.o.   MRN: [id number]  HPI   Patient is an 83 yr old female who presents today with chief complaint of body aches.  Reports difficulty sleeping at night  Due to the pain.   She reports that she ha fall several years ago. Reports that waling is difficult due to leg pain.  Feels week in her legs, feels like her balance is off.  Reports "whole body pains" which she attributes to fibromyalgia.    She does also report symptoms of dry mouth.  This is long-standing but has worsened recently.  She has tried biotin.  Also she reports that her sugars have been running higher recently.  She is followed by endocrinology.  Review of Systems See HPI  Past Medical History:  Diagnosis Date  . Diabetes mellitus   . Hyperlipidemia   . Hypertension   . Left rib fracture 02/18/2014     Social History   Socioeconomic History  . Marital status: Married    Spouse name: Ziaire Bieser  . Number of children: 2  . Years of education: College  . Highest education level: Not on file  Occupational History  . Occupation: housewife  Social Needs  . Financial resource strain: Not on file  . Food insecurity:    Worry: Not on file    Inability: Not on file  . Transportation needs:    Medical: Not on file    Non-medical: Not on file  Tobacco Use  . Smoking status: Never Smoker  . Smokeless tobacco: Never Used  Substance and Sexual Activity  . Alcohol use: No  . Drug use: No  . Sexual activity: Not Currently  Lifestyle  . Physical activity:    Days per week: Not on file    Minutes per session: Not on file  . Stress: Not on file  Relationships  . Social connections:    Talks on phone: Not on file    Gets together: Not on file    Attends religious service: Not on file    Active member of club or organization: Not on file    Attends meetings of clubs or organizations: Not on file    Relationship status: Not on file  .  Intimate partner violence:    Fear of current or ex partner: Not on file    Emotionally abused: Not on file    Physically abused: Not on file    Forced sexual activity: Not on file  Other Topics Concern  . Not on file  Social History Narrative   Patient lives at home with spouse.   Caffeine Use: rarely    Past Surgical History:  Procedure Laterality Date  . APPENDECTOMY    . REFRACTIVE SURGERY     Left Eye  . surgical sterilization  40 years ago    Family History  Problem Relation Age of Onset  . Diabetes Mother   . Hypertension Father   . Depression Unknown   . Cancer Unknown        laryngeal  . Diabetes Unknown     Allergies  Allergen Reactions  . Ace Inhibitors Cough  . Losartan Cough  . Penicillins Hives  . Sulfa Antibiotics Hives  . Sulfonamide Derivatives     Current Outpatient Medications on File Prior to Visit  Medication Sig Dispense Refill  . albuterol (PROAIR HFA) 108 (90 Base) MCG/ACT inhaler Inhale 2 puffs into  the lungs every 6 (six) hours as needed for wheezing or shortness of breath. 3 Inhaler 1  . amLODipine (NORVASC) 5 MG tablet TAKE 1 TABLET BY MOUTH  DAILY 90 tablet 1  . aspirin EC 81 MG tablet Take 81 mg by mouth daily.    Marland Kitchen atorvastatin (LIPITOR) 10 MG tablet TAKE 1 TABLET BY MOUTH  EVERY DAY 90 tablet 1  . b complex vitamins tablet Take 1 tablet by mouth daily.    . Blood Glucose Monitoring Suppl (ONE TOUCH ULTRA 2) w/Device KIT USE AS DIRECTED TO TEST  BLOOD SUGAR TWO TIMES DAILY 1 each 1  . Calcium Citrate-Vitamin D (CALCIUM CITRATE + D) 250-200 MG-UNIT TABS Take 1 tablet daily by mouth. 1 each   . cyclobenzaprine (FLEXERIL) 10 MG tablet TAKE 1 TABLET BY MOUTH TWO  TIMES DAILY AS NEEDED FOR  MUSCLE SPASM(S) 180 tablet 0  . fluconazole (DIFLUCAN) 150 MG tablet TAKE 1 TABLET BY MOUTH ONCE AS NEEDED FOR VAGINAL  INFECTION. 1 tablet 1  . fluticasone (FLONASE) 50 MCG/ACT nasal spray Place 2 sprays into both nostrils daily. 16 g 6  . gabapentin  (NEURONTIN) 300 MG capsule Take 1 capsule (300 mg total) by mouth 3 (three) times daily. 270 capsule 1  . glimepiride (AMARYL) 4 MG tablet TAKE 1 TABLET BY MOUTH  TWICE A DAY 180 tablet 1  . glucose blood (ONE TOUCH ULTRA TEST) test strip Use to test blood sugar 2 times daily as instructed. Dx code: E11.49 200 each 3  . glucose blood (ONETOUCH VERIO) test strip Use to test blood sugars two times daily. 200 each 3  . Insulin Detemir (LEVEMIR FLEXPEN) 100 UNIT/ML Pen Inject 12 Units into the skin at bedtime.  15 mL 3  . insulin lispro (HUMALOG KWIKPEN) 100 UNIT/ML KiwkPen Inject into the skin as needed. Inject 8-10 units at breakfast, 8-10 units at lunch    . Insulin Pen Needle 32G X 4 MM MISC Use 3 per day 100 each 3  . ipratropium (ATROVENT) 0.03 % nasal spray Place 2 sprays into both nostrils every 12 (twelve) hours. 30 mL 12  . latanoprost (XALATAN) 0.005 % ophthalmic solution Place 1 drop into both eyes at bedtime.    Marland Kitchen levocetirizine (XYZAL) 5 MG tablet TAKE 1 TABLET BY MOUTH  EVERY EVENING 90 tablet 1  . levothyroxine (SYNTHROID, LEVOTHROID) 25 MCG tablet TAKE 1 TABLET BY MOUTH  DAILY BEFORE BREAKFAST 90 tablet 0  . meloxicam (MOBIC) 15 MG tablet Take 1 tablet (15 mg total) by mouth daily. 30 tablet 8  . metFORMIN (GLUCOPHAGE) 1000 MG tablet Take 1 tablet (1,000 mg total) by mouth 2 (two) times daily with a meal. 180 tablet 1  . metoprolol succinate (TOPROL-XL) 100 MG 24 hr tablet TAKE 1 TABLET BY MOUTH  DAILY WITH OR IMMEDIATELY  FOLLOWING A MEAL 90 tablet 1  . niacin (NIASPAN) 500 MG CR tablet Take 1,000 mg by mouth at bedtime.    . NONFORMULARY OR COMPOUNDED ITEM Lumbar support belt  #1  As directed 1 each 0  . ONE TOUCH ULTRA TEST test strip TEST TWO TIMES DAILY 200 each 2  . pantoprazole (PROTONIX) 40 MG tablet TAKE 1 TABLET BY MOUTH  DAILY 90 tablet 1  . timolol (TIMOPTIC) 0.5 % ophthalmic solution Place 1 drop into both eyes every morning.    . valsartan (DIOVAN) 320 MG tablet TAKE 1  TABLET BY MOUTH  DAILY 90 tablet 0   No current facility-administered medications  on file prior to visit.     BP (!) 142/64 (BP Location: Right Arm, Cuff Size: Normal)   Pulse 87   Temp 97.9 F (36.6 C) (Oral)   Resp 16   Ht _0  (1.499 m)   Wt 132 lb 12.8 oz (60.2 kg)   SpO2 97%   BMI 26.82 kg/m       Objective:   Physical Exam  Constitutional: She is oriented to person, place, and time. She appears well-developed and well-nourished.  Cardiovascular: Normal rate, regular rhythm and normal heart sounds.  No murmur heard. Pulmonary/Chest: Effort normal and breath sounds normal. No respiratory distress. She has no wheezes.  Musculoskeletal: She exhibits no edema.  Neurological: She is alert and oriented to person, place, and time.  Reflex Scores:      Patellar reflexes are 2+ on the right side and 2+ on the left side. Bilateral upper and lower extremity strength is 5 out of 5.  Psychiatric: She has a normal mood and affect. Her behavior is normal. Judgment and thought content normal.          Assessment & Plan:  Unsteady gait-will refer for physical therapy for gait training and strengthening exercises.  Fibromyalgia-we will give trial of Cymbalta 30 mg once daily.  She is advised to use Tylenol as needed for pain and to avoid use of tramadol.  We will check some autoimmune studies to rule out underlying autoimmune etiology.  Advised follow-up in 6 weeks.  Dry mouth-I suspect that this is being made worsened by her recent elevation of her sugars.  I advised her to follow-up with her endocrinologist to discuss hyperglycemia.  Could consider Sjogren's testing next visit if symptoms are not improved.

## 2017-05-19 ENCOUNTER — Encounter: Payer: Self-pay | Admitting: Family

## 2017-05-19 ENCOUNTER — Telehealth: Payer: Self-pay | Admitting: Endocrinology

## 2017-05-19 LAB — RHEUMATOID FACTOR

## 2017-05-19 LAB — ANA: ANA: NEGATIVE

## 2017-05-19 NOTE — Telephone Encounter (Signed)
Patient states he fasting morning labs are running high 150-160. Patient states she feels like she is going to black out during the day.   She would like to speak to someone about this,.    Please advise

## 2017-05-24 ENCOUNTER — Ambulatory Visit: Payer: Medicare Other | Admitting: Physical Therapy

## 2017-05-24 NOTE — Telephone Encounter (Signed)
Spoke with the patient and she stated she is the one that is messing up and forgetting to take her injections on time and also she is sometimes forgetting to eat- she stated she did not want to make a f/u at this time she is leaving for [place] and will call us back to make an appointment- patient states she will be more careful, and call if she needs anything further

## 2017-05-24 NOTE — Telephone Encounter (Signed)
Needs to be scheduled for follow-up within the next month

## 2017-05-24 NOTE — Telephone Encounter (Signed)
Called pt left [facility]

## 2017-05-24 NOTE — Telephone Encounter (Signed)
She can stop the glimepiride in the morning.  Please find out at what time of the day she is feeling hypoglycemic

## 2017-05-24 NOTE — Telephone Encounter (Signed)
Please advise 

## 2017-05-25 ENCOUNTER — Ambulatory Visit: Payer: Medicare Other | Admitting: Physical Therapy

## 2017-05-26 ENCOUNTER — Other Ambulatory Visit: Payer: Self-pay | Admitting: Family

## 2017-05-26 DIAGNOSIS — K219 Gastro-esophageal reflux disease without esophagitis: Secondary | ICD-10-CM

## 2017-05-26 DIAGNOSIS — E785 Hyperlipidemia, unspecified: Secondary | ICD-10-CM

## 2017-05-26 DIAGNOSIS — I10 Essential (primary) hypertension: Secondary | ICD-10-CM

## 2017-05-26 DIAGNOSIS — E1140 Type 2 diabetes mellitus with diabetic neuropathy, unspecified: Secondary | ICD-10-CM

## 2017-05-29 ENCOUNTER — Other Ambulatory Visit: Payer: Self-pay | Admitting: Family

## 2017-05-29 DIAGNOSIS — E1140 Type 2 diabetes mellitus with diabetic neuropathy, unspecified: Secondary | ICD-10-CM

## 2017-06-01 ENCOUNTER — Encounter: Payer: Self-pay | Admitting: Physical Therapy

## 2017-06-01 ENCOUNTER — Ambulatory Visit: Payer: Medicare Other | Attending: Family | Admitting: Physical Therapy

## 2017-06-01 ENCOUNTER — Other Ambulatory Visit: Payer: Self-pay

## 2017-06-01 DIAGNOSIS — M6281 Muscle weakness (generalized): Secondary | ICD-10-CM

## 2017-06-01 DIAGNOSIS — R2689 Other abnormalities of gait and mobility: Secondary | ICD-10-CM | POA: Diagnosis present

## 2017-06-01 DIAGNOSIS — R2681 Unsteadiness on feet: Secondary | ICD-10-CM | POA: Insufficient documentation

## 2017-06-01 DIAGNOSIS — R262 Difficulty in walking, not elsewhere classified: Secondary | ICD-10-CM

## 2017-06-01 NOTE — Therapy (Addendum)
[facility] [address]  [place], [place], [address] Phone: [phone]   Fax:  [phone]  Physical Therapy Evaluation  Patient Details  Name: Mary Flynn MRN: [id number] Date of Birth: Jul 18, 1934 Referring Provider: [name], NP   Encounter Date: 06/01/2017  PT End of Session - 06/01/17 1425    Visit Number  1    Number of Visits  8    Date for PT Re-Evaluation  07/02/17    Authorization Type  [employer] Medicare    PT Start Time  1315    PT Stop Time  1404    PT Time Calculation (min)  49 min    Activity Tolerance  Patient tolerated treatment well    Behavior During Therapy  [facility] for tasks assessed/performed       Past Medical History:  Diagnosis Date  . Diabetes mellitus   . Hyperlipidemia   . Hypertension   . Left rib fracture 02/18/2014    Past Surgical History:  Procedure Laterality Date  . APPENDECTOMY    . REFRACTIVE SURGERY     Left Eye  . surgical sterilization  40 years ago    There were no vitals filed for this visit.   Subjective Assessment - 06/01/17 1315    Subjective  Pt. stated that she was diagnosed with fibromyalgia that causes her to have pain in bilateral arms/legs which may cause the instability she is experiencing at times. Pt. stated she feels unstable with standing/walking and has gradually gotten worse over the last 2 -3 months that has lead to a decrease in activity.    Pertinent History  Fibromyalgia, Back Pain    Limitations  Walking;Standing    How long can you stand comfortably?  Avoids standing     How long can you walk comfortably?  1 hr.    Patient Stated Goals  decrease pain; able to get back to sewing/cooking; wants to be able to walk without feeling like going to fall; wants to be able to go to [place]     Currently in Pain?  Yes    Pain Score  4  can get to 9-10/10 with flare up    Pain Location  Arm complains of bilateral arm/leg pain    Pain  Orientation  Right;Left    Pain Descriptors / Indicators  Aching    Pain Type  Chronic pain    Pain Radiating Towards  Radiates down the arm/legs    Pain Onset  More than a month ago    Pain Frequency  Constant    Pain Relieving Factors  [place], Heat     Effect of Pain on Daily Activities  Has been unable to sew and cook as much as possible, unable to walk as much she would like (needs rest breaks); unable to stand for periods of time          [facility] PT Assessment - 06/01/17 1315      Assessment   Medical Diagnosis  Gait Instability    Referring Provider  [name], NP    Onset Date/Surgical Date  -- 2-3 months ago    Hand Dominance  Right    Next MD Visit  N/A    Prior Therapy  Yes      Balance Screen   Has the patient fallen in the past 6 months  No    Has the patient had a decrease in activity level because of a  fear of falling?   Yes    Is the patient reluctant to leave their home because of a fear of falling?   Yes      [place] residence    Living Arrangements  Spouse/significant other    Available Help at Discharge  Family    Type of [place] Access  Level entry    [place]  None      Prior Function   Level of [place]  Retired    Leisure  [place], [place], [place] to [employer] Daily      ROM / Strength   AROM / PROM / Strength  Strength      Strength   Strength Assessment Site  Knee;Hip;Ankle    Right/Left Hip  Right;Left    Right Hip Flexion  3+/5    Right Hip Extension  3+/5    Right Hip ABduction  3+/5    Right Hip ADduction  4-/5    Left Hip Flexion  3+/5    Left Hip Extension  3+/5    Left Hip ABduction  3+/5    Left Hip ADduction  3+/5    Right/Left Knee  Right;Left    Right Knee Flexion  5/5    Right Knee Extension  4/5    Left Knee Flexion  5/5    Left Knee Extension  4+/5    Right/Left Ankle  Right;Left    Right Ankle Dorsiflexion   2+/5    Right Ankle Plantar Flexion  2+/5    Left Ankle Dorsiflexion  2+/5    Left Ankle Plantar Flexion  2+/5      Flexibility   Soft Tissue Assessment /Muscle Length  yes    Hamstrings  Mild/Mod Tightness (R>L)     Quadriceps  Mild/Mod Tightness (R/L)     ITB  Mild Tightness (R/L)       Special Tests    Special Tests  Hip Special Tests    Hip Special Tests   Ober's Test      Ober's Test   Findings  Negative    Side  Right;Left      Ambulation/Gait   Ambulation/Gait  Yes    Ambulation/Gait Assistance  5: Supervision    Ambulation Distance (Feet)  60 Feet    Gait Pattern  Narrow base of support    Ambulation Surface  Level;Indoor    Gait velocity  3.32 ft/sec    Gait Comments  Slight Scissoring of Gait and Trunk Sway; 3.32 ft/sec - Research scientist (physical sciences) Assessment   Standardized Balance Assessment  Timed Up and Go Test;10 meter walk test    10 Meter Walk  9.87 secs      Timed Up and Go Test   Normal TUG (seconds)  9.93    Manual TUG (seconds)  10.78    Cognitive TUG (seconds)  13.88    TUG Comments  required heavy cueing/directions for completion                Objective measurements completed on examination: See above findings.              PT Education - 06/01/17 1315    Education provided  Yes    Education Details  Pt. educated on evaluation findings and plan of care  Person(s) Educated  Patient    Methods  Explanation    Comprehension  Verbalized understanding          PT Long Term Goals - 06/01/17 1428      PT LONG TERM GOAL #1   Title  Independent with ongoing HEP    Status  New    Target Date  07/02/17      PT LONG TERM GOAL #2   Title  Increase all R/L hip strength to >/= 4- or 4/5 to increase proximal hip stability    Status  New    Target Date  07/02/17      PT LONG TERM GOAL #3   Title  Pt. demonstrate an increase in gait speed to 4.1 ft/sec to allow for increased safety with community  ambulation    Status  New    Target Date  07/02/17      PT LONG TERM GOAL #4   Title  Pt. will report a 50% increase in stability to allow for completion of sewing/cooking activities     Status  New    Target Date  07/02/17             Plan - 06/01/17 1435    Clinical Impression Statement  Mary Flynn is a 83 y.o. female that was referred to PT for gait instability. Reported to PT with pain in bilateral arms/legs due to fibromyalgia, however main concern was her instability with walking/standing activites. The feeling of instability has gradually increased over the past 2-3 months. Pt. reports that no falls have occured however she has had a decrease in her activity due to the fear of falling.  When assessing gait, pt. demonstrated a narrow base of support, slight scissoring of gait, and increased trunk sway. Pt. has a significant decrease in bilateral hip and ankle strength. Pt. will benefit from skilled physical therapy services to increase strength through bilateral LE, and increase stability. Due to gait abnormalities, FGA will be administered next therapy session to assess balance/gait instability further.     History and Personal Factors relevant to plan of care:  Fibromyalgia; Low Back Pain; Poorly Controlled DM 2; Peripheral Neuropathy    Clinical Presentation  Evolving    Clinical Presentation due to:  Pt. presents with multiple comorbities, symptoms have increased in severity within the last 2-3 months, and difficulty with comphrension of instructions during assessment.     Clinical Decision Making  Moderate    Rehab Potential  Good    PT Frequency  2x / week    PT Duration  4 weeks    PT Treatment/Interventions  ADLs/Self Care Home Management;Moist Heat;Cryotherapy;Balance training;Therapeutic exercise;Therapeutic activities;Functional mobility training;Stair training;Gait training;Neuromuscular re-education;Patient/family education;Manual techniques;Passive range of motion;Electrical  Stimulation    Consulted and Agree with Plan of Care  Patient       Patient will benefit from skilled therapeutic intervention in order to improve the following deficits and impairments:  Abnormal gait, Pain, Hypomobility, Impaired flexibility, Decreased safety awareness, Decreased balance, Decreased activity tolerance, Decreased endurance, Decreased range of motion, Decreased strength, Difficulty walking  Visit Diagnosis: Difficulty in walking, not elsewhere classified  Muscle weakness (generalized)  Unsteadiness on feet  Other abnormalities of gait and mobility     Problem List Patient Active Problem List   Diagnosis Date Noted  . Subclinical hypothyroidism 06/11/2016  . Mid back pain on left side 01/28/2016  . Pre-ulcerative corn or callous 03/13/2015  . HTN (hypertension) 01/11/2015  . DM type 2 with  diabetic peripheral neuropathy ([place]) 10/16/2014  . Left rib fracture 02/18/2014  . Skin infection 09/28/2013  . Otitis, externa, infective 09/28/2013  . Cough 01/11/2013  . Tinea cruris 07/27/2012  . Urinary frequency 07/27/2012  . Eczema 03/08/2012  . Headache(784.0) 03/08/2012  . Arm pain, right 08/24/2011  . Depression 07/16/2011  . Compression fracture of L2 lumbar vertebra ([place]) 06/08/2011  . Chronic insomnia 04/26/2011  . PPD positive 04/17/2011  . URINARY INCONTINENCE, STRESS, FEMALE 04/29/2010  . MEMORY LOSS 12/23/2009  . Chronic bronchitis NEC 12/23/2009  . DIABETIC PERIPHERAL NEUROPATHY 06/28/2009  . Acute low back pain due to trauma 12/20/2008  . FIBROIDS, UTERUS 12/18/2008  . ABDOMINAL BLOATING 12/17/2008  . Constipation 01/24/2008  . GERD 01/23/2008  . SPRAIN&STRAIN OTHER SPECIFIED SITES KNEE&LEG 07/20/2007  . UTI 05/16/2007  . LACERATION OF FINGER 10/12/2006  . THYROID NODULE, RIGHT 08/05/2006  . DM (diabetes mellitus) type II uncontrolled, periph vascular disorder ([place]) 08/05/2006  . HYPERLIPIDEMIA 08/05/2006  . Essential hypertension 08/05/2006   . OSTEOPOROSIS NOS 08/05/2006    Baldomero Lamy, SPT 06/01/2017, 6:47 PM  [facility] [address]  [place], [place], [address] Phone: [phone]   Fax:  [phone]  Name: Mary Flynn MRN: [id number] Date of Birth: 08/16/1934

## 2017-06-01 NOTE — Telephone Encounter (Signed)
error 

## 2017-06-04 ENCOUNTER — Encounter: Payer: Self-pay | Admitting: Physical Therapy

## 2017-06-04 ENCOUNTER — Ambulatory Visit: Payer: Medicare Other | Admitting: Physical Therapy

## 2017-06-04 DIAGNOSIS — R2681 Unsteadiness on feet: Secondary | ICD-10-CM

## 2017-06-04 DIAGNOSIS — R262 Difficulty in walking, not elsewhere classified: Secondary | ICD-10-CM

## 2017-06-04 DIAGNOSIS — R2689 Other abnormalities of gait and mobility: Secondary | ICD-10-CM

## 2017-06-04 DIAGNOSIS — M6281 Muscle weakness (generalized): Secondary | ICD-10-CM

## 2017-06-04 NOTE — Therapy (Addendum)
[facility] [address]  [place], [place], [address] Phone: [phone]   Fax:  [phone]  Physical Therapy Treatment  Patient Details  Name: Mary Flynn MRN: [id number] Date of Birth: 1935-01-17 Referring Provider: [name], NP   Encounter Date: 06/04/2017  PT End of Session - 06/04/17 0930    Visit Number  2    Number of Visits  8    Date for PT Re-Evaluation  07/02/17    Authorization Type  [employer] Medicare    PT Start Time  0930    PT Stop Time  1012    PT Time Calculation (min)  42 min    Equipment Utilized During Treatment  Gait belt    Activity Tolerance  Patient tolerated treatment well    Behavior During Therapy  [facility] for tasks assessed/performed       Past Medical History:  Diagnosis Date  . Diabetes mellitus   . Hyperlipidemia   . Hypertension   . Left rib fracture 02/18/2014    Past Surgical History:  Procedure Laterality Date  . APPENDECTOMY    . REFRACTIVE SURGERY     Left Eye  . surgical sterilization  40 years ago    There were no vitals filed for this visit.  Subjective Assessment - 06/04/17 0930    Subjective  Pt. stated that she is having pain and tightness especially in the L leg today.     Patient Stated Goals  decrease pain; able to get back to sewing/cooking; wants to be able to walk without feeling like going to fall; wants to be able to go to [place]     Currently in Pain?  Yes    Pain Score  5     Pain Location  Leg    Pain Orientation  Left    Pain Descriptors / Indicators  Tightness         [facility] PT Assessment - 06/04/17 0001      Assessment   Medical Diagnosis  Gait Instability    Referring Provider  [name], NP    Onset Date/Surgical Date  -- 2-3 months ago    Hand Dominance  Right    Next MD Visit  N/A    Prior Therapy  Yes      Standardized Balance Assessment   Standardized Balance Assessment  Five Times Sit to Stand     Five times sit to stand comments   15.93 seconds      Functional Gait  Assessment   Gait assessed   Yes    Gait Level Surface  Walks 20 ft, slow speed, abnormal gait pattern, evidence for imbalance or deviates 10-15 in outside of the 12 in walkway width. Requires more than 7 sec to ambulate 20 ft.    Change in Gait Speed  Makes only minor adjustments to walking speed, or accomplishes a change in speed with significant gait deviations, deviates 10-15 in outside the 12 in walkway width, or changes speed but loses balance but is able to recover and continue walking.    Gait with Horizontal Head Turns  Performs head turns with moderate changes in gait velocity, slows down, deviates 10-15 in outside 12 in walkway width but recovers, can continue to walk.    Gait with Vertical Head Turns  Performs task with moderate change in gait velocity, slows down, deviates 10-15 in outside 12 in walkway width but recovers, can continue to walk.  Gait and Pivot Turn  Pivot turns safely in greater than 3 sec and stops with no loss of balance, or pivot turns safely within 3 sec and stops with mild imbalance, requires small steps to catch balance.    Step Over Obstacle  Is able to step over one shoe box (4.5 in total height) without changing gait speed. No evidence of imbalance.    Gait with Narrow Base of Support  Ambulates less than 4 steps heel to toe or cannot perform without assistance.    Gait with Eyes Closed  Walks 20 ft, slow speed, abnormal gait pattern, evidence for imbalance, deviates 10-15 in outside 12 in walkway width. Requires more than 9 sec to ambulate 20 ft.    Ambulating Backwards  Cannot walk 20 ft without assistance, severe gait deviations or imbalance, deviates greater than 15 in outside 12 in walkway width or will not attempt task.    Steps  Alternating feet, must use rail.    Total Score  11    FGA comment:  Pt. required heavy cueing for completion due to impulsiveness                    [facility] Adult PT Treatment/Exercise - 06/04/17 0001      Exercises   Exercises  Knee/Hip      Knee/Hip Exercises: Stretches   Gastroc Stretch  2 reps;30 seconds    Gastroc Stretch Limitations  w/ towel    Other Knee/Hip Stretches  supine butterfly stretch x 2, 30 secs             PT Education - 06/04/17 1146    Education provided  Yes    Education Details  Pt. educated on updated HEP (gastroc stretch, adductor stretch)     Person(s) Educated  Patient    Methods  Explanation;Handout;Demonstration    Comprehension  Verbalized understanding;Returned demonstration          PT Long Term Goals - 06/04/17 1150      PT LONG TERM GOAL #1   Title  Independent with ongoing HEP    Status  On-going      PT LONG TERM GOAL #2   Title  Increase all R/L hip strength to >/= 4- or 4/5 to increase proximal hip stability    Status  On-going      PT LONG TERM GOAL #3   Title  Pt. demonstrate an increase in gait speed to 4.1 ft/sec to allow for increased safety with community ambulation    Status  On-going      PT LONG TERM GOAL #4   Title  Pt. will report a 50% increase in stability to allow for completion of sewing/cooking activities     Status  On-going      PT LONG TERM GOAL #5   Title  Pt. will demonstrate an increase in FGA score to >/= to 19 to decrease fall risks    Status  New    Target Date  07/02/17            Plan - 06/04/17 1147    Clinical Impression Statement  Pt. reported pain and tightness in L leg, and that she was unable to complete the recumbent bike due to pain. Pt. presented with moderate tightness in adductors and gastroc. Therapeutic exercises consisted of stretches to these regions to help alleviate pain and tightness. 5 time sit to stand was completed in 15.93 seconds, scoring above 15 seconds places an individual  at increased fall risk. FGA was adminstered and scored a 11/30, revealing an increased fall risk that needs to be  addressed with further therapy sessions. Pt. also required frequent cueing/directions for task completion due to impulsivity. Plan to start [place] program with next therapy session, to address the balance impairments.     Rehab Potential  Good    PT Frequency  2x / week    PT Duration  4 weeks    PT Treatment/Interventions  ADLs/Self Care Home Management;Moist Heat;Cryotherapy;Balance training;Therapeutic exercise;Therapeutic activities;Functional mobility training;Stair training;Gait training;Neuromuscular re-education;Patient/family education;Manual techniques;Passive range of motion;Electrical Stimulation    Consulted and Agree with Plan of Care  Patient       Patient will benefit from skilled therapeutic intervention in order to improve the following deficits and impairments:  Abnormal gait, Pain, Hypomobility, Impaired flexibility, Decreased safety awareness, Decreased balance, Decreased activity tolerance, Decreased endurance, Decreased range of motion, Decreased strength, Difficulty walking  Visit Diagnosis: Difficulty in walking, not elsewhere classified  Muscle weakness (generalized)  Unsteadiness on feet  Other abnormalities of gait and mobility     Problem List Patient Active Problem List   Diagnosis Date Noted  . Subclinical hypothyroidism 06/11/2016  . Mid back pain on left side 01/28/2016  . Pre-ulcerative corn or callous 03/13/2015  . HTN (hypertension) 01/11/2015  . DM type 2 with diabetic peripheral neuropathy ([place]) 10/16/2014  . Left rib fracture 02/18/2014  . Skin infection 09/28/2013  . Otitis, externa, infective 09/28/2013  . Cough 01/11/2013  . Tinea cruris 07/27/2012  . Urinary frequency 07/27/2012  . Eczema 03/08/2012  . Headache(784.0) 03/08/2012  . Arm pain, right 08/24/2011  . Depression 07/16/2011  . Compression fracture of L2 lumbar vertebra ([place]) 06/08/2011  . Chronic insomnia 04/26/2011  . PPD positive 04/17/2011  . URINARY INCONTINENCE,  STRESS, FEMALE 04/29/2010  . MEMORY LOSS 12/23/2009  . Chronic bronchitis NEC 12/23/2009  . DIABETIC PERIPHERAL NEUROPATHY 06/28/2009  . Acute low back pain due to trauma 12/20/2008  . FIBROIDS, UTERUS 12/18/2008  . ABDOMINAL BLOATING 12/17/2008  . Constipation 01/24/2008  . GERD 01/23/2008  . SPRAIN&STRAIN OTHER SPECIFIED SITES KNEE&LEG 07/20/2007  . UTI 05/16/2007  . LACERATION OF FINGER 10/12/2006  . THYROID NODULE, RIGHT 08/05/2006  . DM (diabetes mellitus) type II uncontrolled, periph vascular disorder ([place]) 08/05/2006  . HYPERLIPIDEMIA 08/05/2006  . Essential hypertension 08/05/2006  . OSTEOPOROSIS NOS 08/05/2006    Baldomero Lamy, SPT 06/04/2017, 12:10 PM  [facility] [address]  [place], [place], [address] Phone: [phone]   Fax:  [phone]  Name: PRICILLA MOEHLE MRN: [id number] Date of Birth: Jul 08, 1934  This entire session was performed under direct supervision and direction of a licensed PT. I have personally read, edited and approved of the note as written.  Percival Spanish, PT, MPT 06/04/17, 12:14 PM  [facility] [address]  [place], [place], [address] Phone: [phone]   Fax:  [phone]  PHYSICAL THERAPY DISCHARGE SUMMARY  Visits from Start of Care: 2  Current functional level related to goals / functional outcomes:   Unable to formally assess as pt only returned for 1 treatment visit before leaving for extended out of town travel and has not returned in >30 days.   Remaining deficits:   As above.   Education / Equipment:   Initial HEP  Plan: Patient agrees to discharge.  Patient goals were not met. Patient is being  discharged due to not returning since the last visit.  ?????    Percival Spanish, PT, MPT 07/08/17, 4:17 PM  [facility] [address]   [place], [place], [address] Phone: [phone]   Fax:  [phone]

## 2017-06-08 ENCOUNTER — Ambulatory Visit: Payer: Medicare Other | Admitting: Physical Therapy

## 2017-06-10 ENCOUNTER — Ambulatory Visit: Payer: Medicare Other | Admitting: Physical Therapy

## 2017-06-11 ENCOUNTER — Ambulatory Visit: Payer: Medicare Other | Admitting: Physical Therapy

## 2017-06-15 ENCOUNTER — Ambulatory Visit: Payer: Medicare Other | Admitting: Physical Therapy

## 2017-06-16 ENCOUNTER — Encounter: Payer: Self-pay | Admitting: Family

## 2017-06-16 ENCOUNTER — Ambulatory Visit (INDEPENDENT_AMBULATORY_CARE_PROVIDER_SITE_OTHER): Payer: Medicare Other | Admitting: Family

## 2017-06-16 DIAGNOSIS — E1140 Type 2 diabetes mellitus with diabetic neuropathy, unspecified: Secondary | ICD-10-CM | POA: Diagnosis not present

## 2017-06-16 MED ORDER — MIRTAZAPINE 7.5 MG PO TABS: 7.5000 mg | ORAL_TABLET | Freq: Every day | ORAL | 1 refills | Status: DC

## 2017-06-16 MED ORDER — NIACIN ER (ANTIHYPERLIPIDEMIC) 500 MG PO TBCR: 1000.0000 mg | EXTENDED_RELEASE_TABLET | Freq: Every day | ORAL | 1 refills | Status: DC

## 2017-06-16 MED ORDER — DULOXETINE HCL 30 MG PO CPEP: 30.0000 mg | ORAL_CAPSULE | Freq: Every day | ORAL | 1 refills | Status: DC

## 2017-06-16 NOTE — Progress Notes (Signed)
Subjective:    Patient ID: Mary Flynn, female    DOB: 1934/08/23, 83 y.o.   MRN: [id number]  HPI  Patient is an 83 year old female who presents today for follow-up.  Last visit we gave her a trial of Cymbalta for fibromyalgia pain.  Rheumatoid factor, sed rate, and ANA were all within normal limits. She notes significant improvement in her fibro pain since starting cymbalta.    Reports that her cough is worsening.  She is using otc allergy medication.  Not taking every day.  Uses flonase.    Review of Systems See HPI  Past Medical History:  Diagnosis Date  . Diabetes mellitus   . Hyperlipidemia   . Hypertension   . Left rib fracture 02/18/2014     Social History   Socioeconomic History  . Marital status: Married    Spouse name: Jerrell Hart  . Number of children: 2  . Years of education: College  . Highest education level: Not on file  Occupational History  . Occupation: housewife  Social Needs  . Financial resource strain: Not on file  . Food insecurity:    Worry: Not on file    Inability: Not on file  . Transportation needs:    Medical: Not on file    Non-medical: Not on file  Tobacco Use  . Smoking status: Never Smoker  . Smokeless tobacco: Never Used  Substance and Sexual Activity  . Alcohol use: No  . Drug use: No  . Sexual activity: Not Currently  Lifestyle  . Physical activity:    Days per week: Not on file    Minutes per session: Not on file  . Stress: Not on file  Relationships  . Social connections:    Talks on phone: Not on file    Gets together: Not on file    Attends religious service: Not on file    Active member of club or organization: Not on file    Attends meetings of clubs or organizations: Not on file    Relationship status: Not on file  . Intimate partner violence:    Fear of current or ex partner: Not on file    Emotionally abused: Not on file    Physically abused: Not on file    Forced sexual activity: Not on file  Other  Topics Concern  . Not on file  Social History Narrative   Patient lives at home with spouse.   Caffeine Use: rarely    Past Surgical History:  Procedure Laterality Date  . APPENDECTOMY    . REFRACTIVE SURGERY     Left Eye  . surgical sterilization  40 years ago    Family History  Problem Relation Age of Onset  . Diabetes Mother   . Hypertension Father   . Depression Unknown   . Cancer Unknown        laryngeal  . Diabetes Unknown     Allergies  Allergen Reactions  . Ace Inhibitors Cough  . Losartan Cough  . Penicillins Hives  . Sulfa Antibiotics Hives  . Sulfonamide Derivatives     Current Outpatient Medications on File Prior to Visit  Medication Sig Dispense Refill  . amLODipine (NORVASC) 5 MG tablet TAKE 1 TABLET BY MOUTH  DAILY 90 tablet 1  . aspirin EC 81 MG tablet Take 81 mg by mouth daily.    Marland Kitchen atorvastatin (LIPITOR) 10 MG tablet TAKE 1 TABLET BY MOUTH  EVERY DAY 90 tablet 1  . b complex  vitamins tablet Take 1 tablet by mouth daily.    . Blood Glucose Monitoring Suppl (ONE TOUCH ULTRA 2) w/Device KIT USE AS DIRECTED TO TEST  BLOOD SUGAR TWO TIMES DAILY 1 each 1  . Calcium Citrate-Vitamin D (CALCIUM CITRATE + D) 250-200 MG-UNIT TABS Take 1 tablet daily by mouth. 1 each   . cyclobenzaprine (FLEXERIL) 10 MG tablet TAKE 1 TABLET BY MOUTH TWO  TIMES DAILY AS NEEDED FOR  MUSCLE SPASM 180 tablet 0  . fluticasone (FLONASE) 50 MCG/ACT nasal spray Place 2 sprays into both nostrils daily. 16 g 6  . gabapentin (NEURONTIN) 300 MG capsule Take 1 capsule (300 mg total) by mouth 3 (three) times daily. 270 capsule 1  . glimepiride (AMARYL) 4 MG tablet TAKE 1 TABLET BY MOUTH  TWICE A DAY 180 tablet 1  . glucose blood (ONE TOUCH ULTRA TEST) test strip Use to test blood sugar 2 times daily as instructed. Dx code: E11.49 200 each 3  . glucose blood (ONETOUCH VERIO) test strip Use to test blood sugars two times daily. 200 each 3  . Insulin Detemir (LEVEMIR FLEXPEN) 100 UNIT/ML Pen  Inject 12 Units into the skin at bedtime.  15 mL 3  . insulin lispro (HUMALOG KWIKPEN) 100 UNIT/ML KiwkPen Inject into the skin as needed. Inject 8-10 units at breakfast, 8-10 units at lunch    . Insulin Pen Needle 32G X 4 MM MISC Use 3 per day 100 each 3  . latanoprost (XALATAN) 0.005 % ophthalmic solution Place 1 drop into both eyes at bedtime.    Marland Kitchen levocetirizine (XYZAL) 5 MG tablet TAKE 1 TABLET BY MOUTH  EVERY EVENING 90 tablet 1  . levothyroxine (SYNTHROID, LEVOTHROID) 25 MCG tablet TAKE 1 TABLET BY MOUTH  DAILY BEFORE BREAKFAST 90 tablet 1  . metFORMIN (GLUCOPHAGE) 1000 MG tablet TAKE 1 TABLET BY MOUTH TWO  TIMES DAILY WITH MEALS 180 tablet 1  . metoprolol succinate (TOPROL-XL) 100 MG 24 hr tablet TAKE 1 TABLET BY MOUTH  DAILY WITH OR IMMEDIATELY  FOLLOWING A MEAL 90 tablet 1  . NONFORMULARY OR COMPOUNDED ITEM Lumbar support belt  #1  As directed 1 each 0  . ONE TOUCH ULTRA TEST test strip TEST TWO TIMES DAILY 200 each 2  . pantoprazole (PROTONIX) 40 MG tablet TAKE 1 TABLET BY MOUTH  DAILY 90 tablet 1  . timolol (TIMOPTIC) 0.5 % ophthalmic solution Place 1 drop into both eyes every morning.    . valsartan (DIOVAN) 320 MG tablet TAKE 1 TABLET BY MOUTH  DAILY 90 tablet 1   No current facility-administered medications on file prior to visit.     BP 138/64 (BP Location: Right Arm, Patient Position: Sitting, Cuff Size: Small)   Pulse 82   Temp 97.6 F (36.4 C) (Oral)   Resp 16   Ht _0  (1.499 m)   Wt 131 lb 12.8 oz (59.8 kg)   SpO2 99%   BMI 26.62 kg/m       Objective:   Physical Exam  Constitutional: She is oriented to person, place, and time. She appears well-developed and well-nourished.  Cardiovascular: Normal rate, regular rhythm and normal heart sounds.  No murmur heard. Pulmonary/Chest: Effort normal and breath sounds normal. No respiratory distress. She has no wheezes.  Musculoskeletal: She exhibits no edema.  Neurological: She is alert and oriented to person, place,  and time.  Psychiatric: She has a normal mood and affect. Her behavior is normal. Judgment and thought content normal.  Assessment & Plan:  Fibromyalgia-  Improved pain on cymbalta, continue same.  Cough- likely worsened by seasonal allergies.  Continue otc antihistamine,  Add singulair.

## 2017-06-18 ENCOUNTER — Ambulatory Visit: Payer: Medicare Other | Admitting: Physical Therapy

## 2017-06-18 ENCOUNTER — Telehealth: Payer: Self-pay | Admitting: Family

## 2017-06-18 MED ORDER — LEVOCETIRIZINE DIHYDROCHLORIDE 5 MG PO TABS: 5.0000 mg | ORAL_TABLET | Freq: Every evening | ORAL | 1 refills | Status: DC

## 2017-06-18 NOTE — Telephone Encounter (Signed)
rx sent [facility] for [facility]al.

## 2017-06-18 NOTE — Telephone Encounter (Signed)
Copied from [place]. Topic: Quick Communication - See Telephone Encounter >> Jun 18, 2017 10:22 AM Rosalin Hawking wrote: CRM for notification. See Telephone encounter for: 06/18/17.    Pt came in office stating was seen by provider on 06-16-2017 for some issues, pt states during her visit provider offered to send a rx to her pharmacy for some allergy issues, but for pt to verify at home first if she had any allergies medication at home, pt states does not have medication at home, if possible for provider to send her a rx for her allergies ASAP to her [facility]. Please advise.

## 2017-06-23 ENCOUNTER — Telehealth: Payer: Self-pay | Admitting: *Deleted

## 2017-06-23 DIAGNOSIS — E785 Hyperlipidemia, unspecified: Secondary | ICD-10-CM

## 2017-06-23 DIAGNOSIS — I10 Essential (primary) hypertension: Secondary | ICD-10-CM

## 2017-06-23 NOTE — Telephone Encounter (Signed)
Ok to send singular 10 mg once daily #90 with 1 refill to mail order please.

## 2017-06-23 NOTE — Telephone Encounter (Signed)
Future lab orders placed

## 2017-06-23 NOTE — Telephone Encounter (Signed)
Spoke with pt and scheduled lab appt for 12/21/17 at 8am.  Copied from [place] [id number]. Topic: Appointment Scheduling - Scheduling Inquiry for Clinic >> Jun 23, 2017  9:55 AM Aurelio Brash B wrote: Reason for CRM: Pt has cpe 11/4 and wants to come in a couple days before for her lab work.

## 2017-06-23 NOTE — Telephone Encounter (Signed)
Notified pt. She states she has already had xyzal and this was not what PCP told her she would prescribe at last OV. Thought we were going to send Singulair for her continued cough as xyzal has not been helping her.  Pt was upset that we had not contacted her before now. Apologized to pt for the delay. Pt will be going out of [place] on 07[place]02[place]17 and would like to get medication before she leaves. Wants rx to go to [facility]. Please advise?

## 2017-06-25 MED ORDER — MONTELUKAST SODIUM 10 MG PO TABS: 10.0000 mg | ORAL_TABLET | Freq: Every day | ORAL | 1 refills | Status: DC

## 2017-06-25 NOTE — Telephone Encounter (Signed)
Rx sent and detailed message left on pt's voicemail and to call if med not received in 2 weeks.

## 2017-06-25 NOTE — Addendum Note (Signed)
Addended by: Kelle Darting A on: 06/25/2017 10:43 AM   Modules accepted: Orders

## 2017-09-24 ENCOUNTER — Other Ambulatory Visit: Payer: Self-pay | Admitting: Endocrinology

## 2017-09-24 ENCOUNTER — Other Ambulatory Visit: Payer: Self-pay | Admitting: Family

## 2017-09-24 DIAGNOSIS — E785 Hyperlipidemia, unspecified: Secondary | ICD-10-CM

## 2017-09-24 DIAGNOSIS — E1140 Type 2 diabetes mellitus with diabetic neuropathy, unspecified: Secondary | ICD-10-CM

## 2017-09-24 DIAGNOSIS — I10 Essential (primary) hypertension: Secondary | ICD-10-CM

## 2017-09-24 DIAGNOSIS — K219 Gastro-esophageal reflux disease without esophagitis: Secondary | ICD-10-CM

## 2017-09-24 NOTE — Telephone Encounter (Signed)
Refill or deny? 

## 2017-09-24 NOTE — Telephone Encounter (Signed)
Refused with note to make appointment

## 2017-10-29 ENCOUNTER — Ambulatory Visit (HOSPITAL_BASED_OUTPATIENT_CLINIC_OR_DEPARTMENT_OTHER)
Admission: RE | Admit: 2017-10-29 | Discharge: 2017-10-29 | Disposition: A | Discharge status: Home / Self Care | Payer: Medicare Other | Source: Ambulatory Visit | Attending: Family | Admitting: Family

## 2017-10-29 ENCOUNTER — Encounter: Payer: Self-pay | Admitting: Family

## 2017-10-29 ENCOUNTER — Ambulatory Visit (INDEPENDENT_AMBULATORY_CARE_PROVIDER_SITE_OTHER): Payer: Medicare Other | Admitting: Family

## 2017-10-29 VITALS — BP 135/65 | HR 88 | Temp 98.7°F | Resp 16 | Ht 59.0 in | Wt 139.4 lb

## 2017-10-29 DIAGNOSIS — I10 Essential (primary) hypertension: Secondary | ICD-10-CM

## 2017-10-29 DIAGNOSIS — E1140 Type 2 diabetes mellitus with diabetic neuropathy, unspecified: Secondary | ICD-10-CM

## 2017-10-29 DIAGNOSIS — Z23 Encounter for immunization: Secondary | ICD-10-CM

## 2017-10-29 DIAGNOSIS — E785 Hyperlipidemia, unspecified: Secondary | ICD-10-CM

## 2017-10-29 DIAGNOSIS — J984 Other disorders of lung: Secondary | ICD-10-CM | POA: Insufficient documentation

## 2017-10-29 DIAGNOSIS — E039 Hypothyroidism, unspecified: Secondary | ICD-10-CM

## 2017-10-29 DIAGNOSIS — R059 Cough, unspecified: Secondary | ICD-10-CM

## 2017-10-29 DIAGNOSIS — R05 Cough: Secondary | ICD-10-CM | POA: Insufficient documentation

## 2017-10-29 LAB — BASIC METABOLIC PANEL
BUN: 14 mg/dL (ref 6–23)
CALCIUM: 9.4 mg/dL (ref 8.4–10.5)
CO2: 28 mEq/L (ref 19–32)
CREATININE: 0.72 mg/dL (ref 0.40–1.20)
Chloride: 99 mEq/L (ref 96–112)
GFR: 82.62 mL/min (ref >60.00)
Glucose, Bld: 239 mg/dL — ABNORMAL HIGH (ref 70–99)
Potassium: 4.6 mEq/L (ref 3.5–5.1)
Sodium: 136 mEq/L (ref 135–145)

## 2017-10-29 LAB — TSH: TSH: 2.33 u[IU]/mL (ref 0.35–4.50)

## 2017-10-29 LAB — HEMOGLOBIN A1C: HEMOGLOBIN A1C: 7.9 % — AB (ref 4.6–6.5)

## 2017-10-29 MED ORDER — AMLODIPINE BESYLATE 10 MG PO TABS: 10.0000 mg | ORAL_TABLET | Freq: Every day | ORAL | 1 refills | Status: DC

## 2017-10-29 NOTE — Patient Instructions (Signed)
Please schedule a follow up with Dr. Dwyane Dee. Complete lab work prior to leaving. Complete chest x-ray on the first floor. Call if you develop fever, worsening cough, or shortness of breath. Stop losartan, increase amlodipine from 5mg  to 10mg .

## 2017-10-29 NOTE — Progress Notes (Signed)
Subjective:    Patient ID: Mary Flynn, female    DOB: 29-Nov-1934, 83 y.o.   MRN: [id number]  HPI   Patient is an 83 year old female who presents today for follow-up.  Diabetes type 2- she is maintained on Levemir, Amaryl, and metformin. Lab Results  Component Value Date   HGBA1C 7.7 (H) 04/05/2017   HGBA1C 7.6 11/27/2016   HGBA1C 7.6 (H) 08/07/2016   Lab Results  Component Value Date   MICROALBUR 1.5 02/04/2017   LDLCALC 35 02/04/2017   CREATININE 0.65 04/05/2017   Cough- reports recent cold/cough symptoms.  She has a history of chronic cough and has seen pulmonology in the past for this.  She reports that she had some body aches congestion and cough while in [place] a few months back.  She was treated with antibiotics at that time.  Overall symptoms improved however she continues to have a persistent cough with some mucus in the back of her throat.  She denies fever or shortness of breath at this time.  HTN-she is maintained on amlodipine 5 mg, metoprolol 100 mg, and Diovan 320 mg. BP Readings from Last 3 Encounters:  10/29/17 135/65  06/16/17 138/64  05/17/17 (!) 142/64   Hypothyroid-continues synthroid, reports good compliance with medication and feeling well on this dose. Lab Results  Component Value Date   TSH 4.33 04/05/2017   Hyperlipidemia-maintained on Niaspan, and Lipitor 10 mg once daily.  Allergic rhinitis-continues Flonase and Xyzal and singulair.   Review of Systems See HPI  Past Medical History:  Diagnosis Date  . Diabetes mellitus   . Hyperlipidemia   . Hypertension   . Left rib fracture 02/18/2014     Social History   Socioeconomic History  . Marital status: Married    Spouse name: Ayat Drenning  . Number of children: 2  . Years of education: College  . Highest education level: Not on file  Occupational History  . Occupation: housewife  Social Needs  . Financial resource strain: Not on file  . Food insecurity:    Worry: Not on file   Inability: Not on file  . Transportation needs:    Medical: Not on file    Non-medical: Not on file  Tobacco Use  . Smoking status: Never Smoker  . Smokeless tobacco: Never Used  Substance and Sexual Activity  . Alcohol use: No  . Drug use: No  . Sexual activity: Not Currently  Lifestyle  . Physical activity:    Days per week: Not on file    Minutes per session: Not on file  . Stress: Not on file  Relationships  . Social connections:    Talks on phone: Not on file    Gets together: Not on file    Attends religious service: Not on file    Active member of club or organization: Not on file    Attends meetings of clubs or organizations: Not on file    Relationship status: Not on file  . Intimate partner violence:    Fear of current or ex partner: Not on file    Emotionally abused: Not on file    Physically abused: Not on file    Forced sexual activity: Not on file  Other Topics Concern  . Not on file  Social History Narrative   Patient lives at home with spouse.   Caffeine Use: rarely    Past Surgical History:  Procedure Laterality Date  . APPENDECTOMY    . REFRACTIVE SURGERY  Left Eye  . surgical sterilization  40 years ago    Family History  Problem Relation Age of Onset  . Diabetes Mother   . Hypertension Father   . Depression Unknown   . Cancer Unknown        laryngeal  . Diabetes Unknown     Allergies  Allergen Reactions  . Ace Inhibitors Cough  . Losartan Cough  . Penicillins Hives  . Sulfa Antibiotics Hives  . Sulfonamide Derivatives     Current Outpatient Medications on File Prior to Visit  Medication Sig Dispense Refill  . amLODipine (NORVASC) 5 MG tablet TAKE 1 TABLET BY MOUTH  DAILY 90 tablet 1  . aspirin EC 81 MG tablet Take 81 mg by mouth daily.    Marland Kitchen atorvastatin (LIPITOR) 10 MG tablet TAKE 1 TABLET BY MOUTH  EVERY DAY 90 tablet 1  . b complex vitamins tablet Take 1 tablet by mouth daily.    . Blood Glucose Monitoring Suppl (ONE TOUCH  ULTRA 2) w/Device KIT USE AS DIRECTED TO TEST  BLOOD SUGAR TWO TIMES DAILY 1 each 1  . Calcium Citrate-Vitamin D (CALCIUM CITRATE + D) 250-200 MG-UNIT TABS Take 1 tablet daily by mouth. 1 each   . cyclobenzaprine (FLEXERIL) 10 MG tablet TAKE 1 TABLET BY MOUTH TWO  TIMES DAILY AS NEEDED FOR  MUSCLE SPASM 180 tablet 0  . DULoxetine (CYMBALTA) 30 MG capsule Take 1 capsule (30 mg total) by mouth daily. 90 capsule 1  . fluconazole (DIFLUCAN) 150 MG tablet TAKE 1 TABLET BY MOUTH ONCE AS NEEDED FOR VAGINAL  INFECTION. 1 tablet 0  . fluticasone (FLONASE) 50 MCG/ACT nasal spray Place 2 sprays into both nostrils daily. 16 g 6  . gabapentin (NEURONTIN) 300 MG capsule Take 1 capsule (300 mg total) by mouth 3 (three) times daily. 270 capsule 1  . glimepiride (AMARYL) 4 MG tablet TAKE 1 TABLET BY MOUTH  TWICE A DAY 180 tablet 1  . glucose blood (ONE TOUCH ULTRA TEST) test strip Use to test blood sugar 2 times daily as instructed. Dx code: E11.49 200 each 3  . glucose blood (ONETOUCH VERIO) test strip Use to test blood sugars two times daily. 200 each 3  . Insulin Detemir (LEVEMIR FLEXPEN) 100 UNIT/ML Pen Inject 12 Units into the skin at bedtime.  15 mL 3  . insulin lispro (HUMALOG KWIKPEN) 100 UNIT/ML KiwkPen Inject into the skin as needed. Inject 8-10 units at breakfast, 8-10 units at lunch    . Insulin Pen Needle 32G X 4 MM MISC Use 3 per day 100 each 3  . latanoprost (XALATAN) 0.005 % ophthalmic solution Place 1 drop into both eyes at bedtime.    Marland Kitchen levocetirizine (XYZAL) 5 MG tablet Take 1 tablet (5 mg total) by mouth every evening. 90 tablet 1  . levothyroxine (SYNTHROID, LEVOTHROID) 25 MCG tablet TAKE 1 TABLET BY MOUTH  DAILY BEFORE BREAKFAST 90 tablet 1  . metFORMIN (GLUCOPHAGE) 1000 MG tablet TAKE 1 TABLET BY MOUTH TWO  TIMES DAILY WITH MEALS 180 tablet 1  . metoprolol succinate (TOPROL-XL) 100 MG 24 hr tablet TAKE 1 TABLET BY MOUTH  DAILY WITH OR IMMEDIATELY  FOLLOWING A MEAL 90 tablet 1  . mirtazapine  (REMERON) 7.5 MG tablet Take 1 tablet (7.5 mg total) by mouth at bedtime. 90 tablet 1  . montelukast (SINGULAIR) 10 MG tablet Take 1 tablet (10 mg total) by mouth at bedtime. 90 tablet 1  . niacin (NIASPAN) 500 MG CR tablet  Take 2 tablets (1,000 mg total) by mouth at bedtime. 90 tablet 1  . NONFORMULARY OR COMPOUNDED ITEM Lumbar support belt  #1  As directed 1 each 0  . ONE TOUCH ULTRA TEST test strip TEST TWO TIMES DAILY 200 each 2  . pantoprazole (PROTONIX) 40 MG tablet TAKE 1 TABLET BY MOUTH  DAILY 90 tablet 1  . timolol (TIMOPTIC) 0.5 % ophthalmic solution Place 1 drop into both eyes every morning.    . valsartan (DIOVAN) 320 MG tablet TAKE 1 TABLET BY MOUTH  DAILY 90 tablet 1   No current facility-administered medications on file prior to visit.     BP 135/65 (BP Location: Right Arm, Cuff Size: Normal)   Pulse 88   Temp 98.7 F (37.1 C) (Oral)   Resp 16   Ht '4\' 11"'$  (1.499 m)   Wt 139 lb 6.4 oz (63.2 kg)   SpO2 98%   BMI 28.16 kg/m       Objective:   Physical Exam  Constitutional: She is oriented to person, place, and time. She appears well-developed and well-nourished.  HENT:  Head: Normocephalic and atraumatic.  Right Ear: Tympanic membrane and ear canal normal.  Left Ear: Tympanic membrane and ear canal normal.  Mouth/Throat: No oropharyngeal exudate, posterior oropharyngeal edema or posterior oropharyngeal erythema.  Neck: Neck supple.  Cardiovascular: Normal rate, regular rhythm and normal heart sounds.  No murmur heard. Pulmonary/Chest: Effort normal and breath sounds normal. No respiratory distress. She has no wheezes.  Musculoskeletal: She exhibits no edema.  Lymphadenopathy:    She has no cervical adenopathy.  Neurological: She is alert and oriented to person, place, and time.  Skin: Skin is warm and dry.  Psychiatric: She has a normal mood and affect. Her behavior is normal. Judgment and thought content normal.          Assessment & Plan:  Hypertension-  I am concerned that losartan could be contributing to her cough.  Will discontinue losartan and plan to increase amlodipine from 5 to 10 mg once daily.  Diabetes type 2- she is due for follow-up with her endocrinologist and I have advised her to schedule a follow-up appointment.  Will obtain follow-up A1c today.  Hypothyroid- clinically stable on Synthroid, obtain TSH.  Continue Synthroid.  Hyperlipidemia- LDL at goal, continue statin and Niaspan.  Cough- question if related to arm.  Discontinue losartan as above.  Obtain chest x-ray.  Plan follow-up in 1 month.  She is advised to call sooner if shortness of breath, worsening cough or fever.  Cough persists despite these changes will plan referral back to pulmonary.  Flu shot today

## 2017-10-29 NOTE — Addendum Note (Signed)
Addended by: Kelle Darting A on: 10/29/2017 10:25 AM   Modules accepted: Orders

## 2017-11-02 ENCOUNTER — Other Ambulatory Visit: Payer: Self-pay | Admitting: Podiatry

## 2017-11-02 DIAGNOSIS — R2681 Unsteadiness on feet: Secondary | ICD-10-CM

## 2017-11-09 ENCOUNTER — Ambulatory Visit: Payer: Self-pay | Admitting: *Deleted

## 2017-11-09 NOTE — Telephone Encounter (Signed)
Pt called asking if she should be taking her medications together at night, the mirtazapine, cyclobenzaprine, and melatonin. According to the research, she should not be taking the mirtazapine and cyclobenzaprine together because of increase serotonin syndrome. She will continue to take her medications but not all together. Encourage to call back for any other concerns she may have, pt voiced understanding. No triage.  Reason for Disposition . General information question, no triage required and triager able to answer question  Answer Assessment - Initial Assessment Questions 1. REASON FOR CALL or QUESTION: "What is your reason for calling today?" or "How can I best help you?" or "What question do you have that I can help answer?"     Pt had question regarding how to take her medications:  mirtazapine, cyclobenzaprine, and melatonin  Protocols used: INFORMATION ONLY CALL-A-AH

## 2017-11-11 ENCOUNTER — Ambulatory Visit (INDEPENDENT_AMBULATORY_CARE_PROVIDER_SITE_OTHER): Payer: Medicare Other | Admitting: Internal Medicine

## 2017-11-11 ENCOUNTER — Encounter: Payer: Self-pay | Admitting: Internal Medicine

## 2017-11-11 ENCOUNTER — Ambulatory Visit: Payer: Self-pay | Admitting: Pulmonary Disease

## 2017-11-11 VITALS — BP 118/66 | HR 94 | Ht <= 58 in | Wt 138.0 lb

## 2017-11-11 DIAGNOSIS — R05 Cough: Secondary | ICD-10-CM

## 2017-11-11 DIAGNOSIS — R059 Cough, unspecified: Secondary | ICD-10-CM

## 2017-11-11 MED ORDER — TRAMADOL HCL 50 MG PO TABS: 50.0000 mg | ORAL_TABLET | ORAL | 0 refills | Status: DC | PRN

## 2017-11-11 NOTE — Patient Instructions (Addendum)
The key to effective treatment for your cough is eliminating the non-stop cycle of cough you're stuck in long enough to let your airway heal completely and then see if there is anything still making you cough once you stop the cough suppression, but this should take no more than 5 days to figure out  First take delsym two tsp every 12 hours and supplement if needed with  tramadol 50 mg up to 1  every 4 hours to suppress the urge to cough at all or even clear your throat. Swallowing water or using ice chips/non mint and menthol containing candies (such as lifesavers or sugarless jolly ranchers) are also effective.  You should rest your voice and avoid activities that you know make you cough.  Once you have eliminated the cough for 3 straight days try reducing the tramadol first,  then the delsym as tolerated.     Increase gabapentin to 300 mg three times a day     Protonix (pantoprazole) Take 30-60 min before first meal of the day and Pepcid 20 mg one after supper plus chlorpheniramine 4 mg x 2 at bedtime (both available over the counter)  until cough is completely gone for at least a week without the need for cough suppression  GERD (REFLUX)  is an extremely common cause of respiratory symptoms, many times with no significant heartburn at all.    It can be treated with medication, but also with lifestyle changes including avoidance of late meals, excessive alcohol, smoking cessation, and avoid fatty foods, chocolate, peppermint, colas, red wine, and acidic juices such as orange juice.  NO MINT OR MENTHOL PRODUCTS SO NO COUGH DROPS   USE HARD CANDY INSTEAD (jolley ranchers or Stover's or Lifesavers (all available in sugarless versions) NO OIL BASED VITAMINS - use powdered substitutes.   See Tammy NP w/in 2 weeks with all your medications, even over the counter meds, separated in two separate bags, the ones you take no matter what vs the ones you stop once you feel better and take only as needed  when you feel you need them.   Tammy  will generate for you a new user friendly medication calendar that will put [place] all on the same page re: your medication use.     Without this process, it simply isn't possible to assure that we are providing  your outpatient care  with  the attention to detail we feel you deserve.   If we cannot assure that you're getting that kind of care,  then we cannot manage your problem effectively from this clinic.  Once you have seen Tammy and we are sure that we're all on the same page with your medication use she will arrange follow up with me.

## 2017-11-11 NOTE — Progress Notes (Signed)
Subjective:     Patient ID: Mary Flynn, female   DOB: 1934/07/09 MRN: [id number]    Brief patient profile:  83 yo [place] female never smoker  no childhood resp problems including pregancy then around 2004 began a pattern of recurrent cyclical cough  pattern lasting from sev  months up to a year and then gone up to 4-5 months self referred to pulmonary clinic 01/11/2013 for re-eval of recurrent persistent cough - Workup including allergy testing,barium swallow, 24hour pH probe nonrevealing previously     History of Present Illness  01/11/2013 1st [facility] office visit/ Ladeja Pelham in EMR era with recurrent cough Chief Complaint  Patient presents with  . Acute Visit    Pt c/o increased cough x 2 wks- prod with large amounts of clear sputum.  She states that she coughs until she gags and vomits.   cough tends to be worse with any thing in her mouth and with talking.  Actual mucus production is min and "nothing ever works" to include abx, prednisone, inhalers, gerd rx. eval at [facility] with conclusion "this isn't reflux" Already eval 01/02/13 by Dr Larose Kells rx with zpack with sorethroat that has resolved  >>Stop cozar (losartan), stop calcium carbonate, and the fosfamax, Benicar 20 mg one daily  Protonix 40 mg Take 30- 60 min before your first and last meals of the day  Neurontin (gabapentin) 100 mg take 2 with every meal Chlortrimeton 4 mg at bedtime    01/25/2013 Follow up and med review  Patient returns for a two-week followup and medication review.  We reviewed all her medications organized them and updated MAR (med calendar database was down)  with patient education. Appears that she is taking her medications correctly. Patient reports that her cough is much improved. Patient denies any hemoptysis, chest pain, orthopnea, PND, or leg splint. Last visit. Patient was taken off olmesartan, calcium, and Fosamax. She was started on Benicar 20 mg daily. Treated for reflux, prevention regimen with  Protonix twice daily. Her Neurontin was increased to 200 mg 3 times daily. As you start on Chlor-Trimeton 4 mg at bedtime. Chest x-ray last visit showed a clearing of the left lower lobe pneumonia. rec Protonix 40 mg Take 30- 60 min before your first and last meals of the day  Neurontin (gabapentin) 100 mg take 2 with every meal Chlortrimeton 4 mg every 4hr as needed.  Take delsym two tsp every 12 hours and supplement if needed with  tramadol 50 mg up to 2 every 4 hours as needed to suppress the urge to cough.   GERD diet    02/22/2013 f/u ov/Domnick Chervenak re: chronic cough/ no med calendar Chief Complaint  Patient presents with  . Follow-up    Pt states her symptoms have improved greatly.  Still some nonprod cough and SOB with exertion, excessive talking.     >>neurontin 300 Three times a day    03/10/13 Follow up and  Med review  est med calendar - pt brought all meds with her today.  reports cough is improved except for when she talks/sings or eats.  white mucus production in the mornings. Is starting to feel better but cough is not totally gone.  Does have sinus drainage often  No fever, chest pain or orthopnea.  We reviewed her meds and organized them into a med calendar with pt education .  >>aggressive cough control   03/24/2013 Follow up  Patient returns for a two-week followup. Last visit. She was treated with aggressive  cough suppression regimen, along with trigger control with reflux, rhinitis prevention. Patient reports that she did have significant improvement in her cough. However, she has now developed some nasal congestion, sinus pressure and cough. Has returned despite using gabapentin 3 times daily. Delsym and tramadol for cough control. She denies any hemoptysis, orthopnea, PND, or leg swelling. Last chest x-ray in November showed platelike atelectasis in the left and right lung base rec Continue on Benicar 40 mg one daily  Continue on  Omeprazole '20mg'$  daily   Continue on  Gabapentin '300mg'$  Three times a day  .   Use Chlortrimeton '4mg'$  every 4hr As needed  Drippy nose/drainage.  Take delsym two tsp every 12 hours and supplement if needed with  tramadol 50 mg up to 2 every 4 hours  04/14/2013 acute  ov/Juliyah Mergen RC:VELFYBO x 10 y cough worse since the last week in Jan 2014 / abn CT chst  Chief Complaint  Patient presents with  . Follow-up    Increased cough since last visit. She c/o HA, sore throat and ear pain x 2 days. Cough is prod with clear sputum.   Never purulent sputum, cough worse day than night. No sob. Not using ppi ac and using home remedies rec Take omeprazole 40 mg Take 30-60 min before first meal of the day and pepcid 20 mg at bedtime GERD   05/03/2013 f/u ov/Demarcus Thielke re: recurrent cough x 10 y Chief Complaint  Patient presents with  . Follow-up    Pt states that her cough is much improved. No new co's today.    Still taking tramadol about twice daily typically in afternoon at at hs  but no cough x 2 weeks rec Add chlortrimeton 4 mg one at bedtime as an "automatic" medication - ok to take also as needed during the day for drainage Try stopping the tramadol and only taking as needed if cough comes back and if so return for further work up  Valsartan 320= benicar 40  See calendar for specific medication instructions     11/11/2017  Pulmonary  consulation / Normagene Harvie recurrent cough since 08/2017 / no med calendar, confused with med names Chief Complaint  Patient presents with  . Pulmonary Consult    Referred by Dr Earlie Counts. Pt c/o increased cough x 2-3 months. She states eating and coughing can trigger the cough.  She is coughing up white sputum. Cough occ wakes her up in the night. She also c/o watery eyes and runny nose.    after flew to [place] summer 2019 > recurrent cough p two weeks into the trip "felt like a cold but it's gone" Gradually worse since with use of voice Bed Is flat  Taking ppi pc and cough drops  Unless coughing > Not limited  by breathing from desired activities  No better on singulair / flonase/xyzal and only taking gabapentin twice daily    No obvious day to day or daytime variability or assoc excess/ purulent sputum or mucus plugs or hemoptysis or cp or chest tightness, subjective wheeze or overt sinus or hb symptoms.    Also denies any obvious fluctuation of symptoms with weather or environmental changes or other aggravating or alleviating factors except as outlined above   No unusual exposure hx or h/o childhood pna/ asthma or knowledge of premature birth.  Current Allergies, Complete Past Medical History, Past Surgical History, Family History, and Social History were reviewed in [employer] record.  ROS  The following are not active  complaints unless bolded Hoarseness, sore throat, dysphagia, dental problems, itching, sneezing,  nasal congestion or discharge of excess mucus or purulent secretions, ear ache,   fever, chills, sweats, unintended wt loss or wt gain, classically pleuritic or exertional cp,  orthopnea pnd or arm/hand swelling  or leg swelling, presyncope, palpitations, abdominal pain, anorexia, nausea, vomiting, diarrhea  or change in bowel habits or change in bladder habits, change in stools or change in urine, dysuria, hematuria,  rash, arthralgias, visual complaints, headache, numbness, weakness or ataxia or problems with walking or coordination,  change in mood or  memory.        Current Meds  Medication Sig  . amLODipine (NORVASC) 10 MG tablet Take 1 tablet (10 mg total) by mouth daily.  Marland Kitchen aspirin EC 81 MG tablet Take 81 mg by mouth daily.  Marland Kitchen atorvastatin (LIPITOR) 10 MG tablet TAKE 1 TABLET BY MOUTH  EVERY DAY  . b complex vitamins tablet Take 1 tablet by mouth daily.  . Blood Glucose Monitoring Suppl (ONE TOUCH ULTRA 2) w/Device KIT USE AS DIRECTED TO TEST  BLOOD SUGAR TWO TIMES DAILY  . Calcium Citrate-Vitamin D (CALCIUM CITRATE + D) 250-200 MG-UNIT TABS Take 1 tablet  daily by mouth.  . canagliflozin (INVOKANA) 100 MG TABS tablet Take 100 mg by mouth daily before breakfast.  . cyclobenzaprine (FLEXERIL) 10 MG tablet TAKE 1 TABLET BY MOUTH TWO  TIMES DAILY AS NEEDED FOR  MUSCLE SPASM  . DULoxetine (CYMBALTA) 30 MG capsule Take 1 capsule (30 mg total) by mouth daily.  . fluconazole (DIFLUCAN) 150 MG tablet TAKE 1 TABLET BY MOUTH ONCE AS NEEDED FOR VAGINAL  INFECTION.  . fluticasone (FLONASE) 50 MCG/ACT nasal spray Place 2 sprays into both nostrils daily.  Marland Kitchen gabapentin (NEURONTIN) 300 MG capsule Take 1 capsule (300 mg total) by mouth 3 (three) times daily.  Marland Kitchen glimepiride (AMARYL) 4 MG tablet TAKE 1 TABLET BY MOUTH  TWICE A DAY  . glucose blood (ONE TOUCH ULTRA TEST) test strip Use to test blood sugar 2 times daily as instructed. Dx code: E11.49  . glucose blood (ONETOUCH VERIO) test strip Use to test blood sugars two times daily.  . Insulin Detemir (LEVEMIR FLEXPEN) 100 UNIT/ML Pen Inject 12 Units into the skin at bedtime.   . insulin lispro (HUMALOG KWIKPEN) 100 UNIT/ML KiwkPen Inject into the skin as needed. Inject 8-10 units at breakfast, 8-10 units at lunch  . Insulin Pen Needle 32G X 4 MM MISC Use 3 per day  . latanoprost (XALATAN) 0.005 % ophthalmic solution Place 1 drop into both eyes at bedtime.  Marland Kitchen levocetirizine (XYZAL) 5 MG tablet Take 1 tablet (5 mg total) by mouth every evening.  Marland Kitchen levothyroxine (SYNTHROID, LEVOTHROID) 25 MCG tablet TAKE 1 TABLET BY MOUTH  DAILY BEFORE BREAKFAST  . Melatonin 5 MG TABS Take 1 tablet by mouth at bedtime as needed.  . metFORMIN (GLUCOPHAGE) 1000 MG tablet TAKE 1 TABLET BY MOUTH TWO  TIMES DAILY WITH MEALS  . metoprolol succinate (TOPROL-XL) 100 MG 24 hr tablet TAKE 1 TABLET BY MOUTH  DAILY WITH OR IMMEDIATELY  FOLLOWING A MEAL  . mirtazapine (REMERON) 7.5 MG tablet Take 1 tablet (7.5 mg total) by mouth at bedtime.  . montelukast (SINGULAIR) 10 MG tablet Take 1 tablet (10 mg total) by mouth at bedtime.  . Multiple  Vitamins-Minerals (HAIR SKIN AND NAILS FORMULA) TABS Take by mouth daily.  . niacin (NIASPAN) 500 MG CR tablet Take 2 tablets (1,000 mg total) by  mouth at bedtime.  . NONFORMULARY OR COMPOUNDED ITEM Lumbar support belt  #1  As directed  . ONE TOUCH ULTRA TEST test strip TEST TWO TIMES DAILY  . pantoprazole (PROTONIX) 40 MG tablet TAKE 1 TABLET BY MOUTH  DAILY  . timolol (TIMOPTIC) 0.5 % ophthalmic solution Place 1 drop into both eyes every morning.  . vitamin E 400 UNIT capsule Take 400 Units by mouth daily.                   Objective:   Physical Exam  amb talkative [place] female nad   05/03/2013      136 > 11/11/2017   > 11/11/2017     138     04/14/13 136 lb (61.689 kg)  03/28/13 137 lb (62.143 kg)  03/24/13 138 lb (62.596 kg)     Vital signs reviewed - Note on arrival 02 sats  100% on RA     HEENT: nl dentition, turbinates bilaterally, and oropharynx. Nl external ear canals without cough reflex   NECK :  without JVD/Nodes/TM/ nl carotid upstrokes bilaterally   LUNGS: no acc muscle use,  Nl contour chest which is clear to A and P bilaterally without cough on insp or exp maneuvers   CV:  RRR  no s3 or murmur or increase in P2, and no edema   ABD:  soft and nontender with nl inspiratory excursion in the supine position. No bruits or organomegaly appreciated, bowel sounds nl  MS:  Nl gait/ ext warm without deformities, calf tenderness, cyanosis or clubbing No obvious joint restrictions   SKIN: warm and dry without lesions    NEURO:  alert, approp, nl sensorium with  no motor or cerebellar deficits apparent.         I personally reviewed images and agree with radiology impression as follows:  CXR:  10/29/17 Bronchitic changes with scarring in both lungs. No acute abnormalities.      Assessment:

## 2017-11-12 ENCOUNTER — Other Ambulatory Visit: Payer: Self-pay | Admitting: Family

## 2017-11-12 ENCOUNTER — Encounter: Payer: Self-pay | Admitting: Adult Health

## 2017-11-12 ENCOUNTER — Other Ambulatory Visit: Payer: Self-pay | Admitting: Endocrinology

## 2017-11-12 ENCOUNTER — Ambulatory Visit (INDEPENDENT_AMBULATORY_CARE_PROVIDER_SITE_OTHER): Payer: Medicare Other | Admitting: Adult Health

## 2017-11-12 ENCOUNTER — Telehealth: Payer: Self-pay | Admitting: Adult Health

## 2017-11-12 ENCOUNTER — Encounter: Payer: Self-pay | Admitting: Internal Medicine

## 2017-11-12 DIAGNOSIS — R05 Cough: Secondary | ICD-10-CM | POA: Diagnosis not present

## 2017-11-12 DIAGNOSIS — E1140 Type 2 diabetes mellitus with diabetic neuropathy, unspecified: Secondary | ICD-10-CM

## 2017-11-12 DIAGNOSIS — I10 Essential (primary) hypertension: Secondary | ICD-10-CM

## 2017-11-12 DIAGNOSIS — E785 Hyperlipidemia, unspecified: Secondary | ICD-10-CM

## 2017-11-12 DIAGNOSIS — K219 Gastro-esophageal reflux disease without esophagitis: Secondary | ICD-10-CM

## 2017-11-12 DIAGNOSIS — R059 Cough, unspecified: Secondary | ICD-10-CM

## 2017-11-12 MED ORDER — BENZONATATE 200 MG PO CAPS: 200.0000 mg | ORAL_CAPSULE | Freq: Three times a day (TID) | ORAL | 1 refills | Status: DC | PRN

## 2017-11-12 NOTE — Progress Notes (Signed)
_0  ID: Mary Flynn, female    DOB: November 05, 1934, 83 y.o.   MRN: [id number]  Chief Complaint  Patient presents with  . Follow-up    Referring provider: [name], NP  HPI: 83 year old [place] female never smoker followed for chronic cough  11/12/2017 Follow up : Chronic cough Mary Flynn Patient returns for a follow-up and medication view.  Patient was seen yesterday in the office for a recurrent cough.  Patient was seen last in 2015 for recurrent cough.  She was treated with a cough suppression regimen at that time.  Says that her cough got much better.  In fact went almost totally away.  Has been doing well until recently.  Her cough is come back.  She says that she has a recurrent cough .  She was seen yesterday.  And recommended to use Delsym and tramadol.  And Gabapentin Three times a day  .  However her family does not want her to use tramadol as she has had falls in the past.  They are worried that she is on too many medications.  We discussed that this is a sedating medication.  And have talked about changing it to an alternative such as Best boy.  We reviewed all her medications and organize them into a medication calendar.  Patient is on multiple medications and has had multiple addition since her last visit 4 years ago.  She denies any significant sinus congestion. X-ray earlier this month showed bronchitic changes.  Denies any fever chest pain orthopnea PND or increased leg swelling.  Allergies  Allergen Reactions  . Ace Inhibitors Cough  . Losartan Cough  . Penicillins Hives  . Sulfa Antibiotics Hives  . Sulfonamide Derivatives     Immunization History  Administered Date(s) Administered  . Influenza Split 11/24/2010  . Influenza Whole 11/30/2007, 12/05/2008, 12/04/2009  . Influenza, High Dose Seasonal PF 11/17/2013, 11/08/2014, 11/15/2015, 11/23/2016, 10/29/2017  . Influenza,inj,Quad PF,6+ Mos 11/18/2012  . PPD Test 02/16/2011, 04/14/2011  .  Pneumococcal Conjugate-13 02/02/2014  . Pneumococcal Polysaccharide-23 12/09/2007  . Td 10/12/2006  . Zoster 06/04/2010    Past Medical History:  Diagnosis Date  . Diabetes mellitus   . Hyperlipidemia   . Hypertension   . Left rib fracture 02/18/2014    Tobacco History: Social History   Tobacco Use  Smoking Status Never Smoker  Smokeless Tobacco Never Used   Counseling given: Not Answered   Outpatient Medications Prior to Visit  Medication Sig Dispense Refill  . amLODipine (NORVASC) 10 MG tablet Take 1 tablet (10 mg total) by mouth daily. 90 tablet 1  . aspirin EC 81 MG tablet Take 81 mg by mouth daily.    Marland Kitchen atorvastatin (LIPITOR) 10 MG tablet TAKE 1 TABLET BY MOUTH  EVERY DAY 90 tablet 1  . b complex vitamins tablet Take 1 tablet by mouth daily.    . Blood Glucose Monitoring Suppl (ONE TOUCH ULTRA 2) w/Device KIT USE AS DIRECTED TO TEST  BLOOD SUGAR TWO TIMES DAILY 1 each 1  . Calcium Citrate-Vitamin D (CALCIUM CITRATE + D) 250-200 MG-UNIT TABS Take 1 tablet daily by mouth. 1 each   . canagliflozin (INVOKANA) 100 MG TABS tablet Take 100 mg by mouth daily before breakfast.    . cyclobenzaprine (FLEXERIL) 10 MG tablet TAKE 1 TABLET BY MOUTH TWO  TIMES DAILY AS NEEDED FOR  MUSCLE SPASM 180 tablet 0  . DULoxetine (CYMBALTA) 30 MG capsule TAKE 1 CAPSULE BY MOUTH  DAILY 90 capsule 1  .  fluconazole (DIFLUCAN) 150 MG tablet TAKE 1 TABLET BY MOUTH ONCE AS NEEDED FOR VAGINAL  INFECTION. 1 tablet 0  . fluticasone (FLONASE) 50 MCG/ACT nasal spray Place 2 sprays into both nostrils daily. 16 g 6  . gabapentin (NEURONTIN) 300 MG capsule Take 1 capsule (300 mg total) by mouth 3 (three) times daily. 270 capsule 1  . glimepiride (AMARYL) 4 MG tablet TAKE 1 TABLET BY MOUTH  TWICE A DAY 180 tablet 1  . glucose blood (ONE TOUCH ULTRA TEST) test strip Use to test blood sugar 2 times daily as instructed. Dx code: E11.49 200 each 3  . glucose blood (ONE TOUCH ULTRA TEST) test strip USE TO TEST BLOOD  SUGARS  TWO TIMES DAILY 200 each 1  . Insulin Detemir (LEVEMIR FLEXPEN) 100 UNIT/ML Pen Inject 12 Units into the skin at bedtime.  15 mL 3  . insulin lispro (HUMALOG KWIKPEN) 100 UNIT/ML KiwkPen Inject into the skin as needed. Inject 8-10 units at breakfast, 8-10 units at lunch    . Insulin Pen Needle 32G X 4 MM MISC Use 3 per day 100 each 3  . latanoprost (XALATAN) 0.005 % ophthalmic solution Place 1 drop into both eyes at bedtime.    Marland Kitchen levocetirizine (XYZAL) 5 MG tablet Take 1 tablet (5 mg total) by mouth every evening. 90 tablet 1  . levothyroxine (SYNTHROID, LEVOTHROID) 25 MCG tablet TAKE 1 TABLET BY MOUTH  DAILY BEFORE BREAKFAST 90 tablet 1  . Melatonin 5 MG TABS Take 1 tablet by mouth at bedtime as needed.    . metFORMIN (GLUCOPHAGE) 1000 MG tablet TAKE 1 TABLET BY MOUTH TWO  TIMES DAILY WITH MEALS 180 tablet 1  . metoprolol succinate (TOPROL-XL) 100 MG 24 hr tablet TAKE 1 TABLET BY MOUTH  DAILY WITH OR IMMEDIATELY  FOLLOWING A MEAL 90 tablet 1  . mirtazapine (REMERON) 7.5 MG tablet TAKE 1 TABLET BY MOUTH AT  BEDTIME 90 tablet 1  . montelukast (SINGULAIR) 10 MG tablet TAKE 1 TABLET BY MOUTH AT  BEDTIME 90 tablet 1  . Multiple Vitamins-Minerals (HAIR SKIN AND NAILS FORMULA) TABS Take by mouth daily.    . niacin (NIASPAN) 500 MG CR tablet Take 2 tablets (1,000 mg total) by mouth at bedtime. 90 tablet 1  . NONFORMULARY OR COMPOUNDED ITEM Lumbar support belt  #1  As directed 1 each 0  . ONE TOUCH ULTRA TEST test strip TEST TWO TIMES DAILY 200 each 2  . pantoprazole (PROTONIX) 40 MG tablet TAKE 1 TABLET BY MOUTH  DAILY 90 tablet 1  . timolol (TIMOPTIC) 0.5 % ophthalmic solution Place 1 drop into both eyes every morning.    . valsartan (DIOVAN) 320 MG tablet TAKE 1 TABLET BY MOUTH  DAILY 90 tablet 1  . vitamin E 400 UNIT capsule Take 400 Units by mouth daily.    . traMADol (ULTRAM) 50 MG tablet Take 1 tablet (50 mg total) by mouth every 4 (four) hours as needed (for cough or pain). (Patient not  taking: Reported on 11/12/2017) 40 tablet 0   No facility-administered medications prior to visit.      Review of Systems  Constitutional:   No  weight loss, night sweats,  Fevers, chills, + fatigue, or  lassitude.  HEENT:   No headaches,  Difficulty swallowing,  Tooth/dental problems, or  Sore throat,                No sneezing, itching, ear ache, nasal congestion, post nasal drip,   CV:  No chest pain,  Orthopnea, PND, swelling in lower extremities, anasarca, dizziness, palpitations, syncope.   GI  No heartburn, indigestion, abdominal pain, nausea, vomiting, diarrhea, change in bowel habits, loss of appetite, bloody stools.   Resp:    No chest wall deformity  Skin: no rash or lesions.  GU: no dysuria, change in color of urine, no urgency or frequency.  No flank pain, no hematuria   MS:  No joint pain or swelling.  No decreased range of motion.  No back pain.    Physical Exam  BP 116/60 (BP Location: Left Arm, Cuff Size: Normal)   Pulse 86   Ht _0  (1.473 m)   Wt 138 lb 12.8 oz (63 kg)   SpO2 93%   BMI 29.01 kg/m   GEN: A/Ox3; pleasant , NAD, elderly    HEENT:  [place]/AT,  EACs-clear, TMs-wnl, NOSE-clear drainage  THROAT-clear, no lesions, no postnasal drip or exudate noted.   NECK:  Supple w/ fair ROM; no JVD; normal carotid impulses w/o bruits; no thyromegaly or nodules palpated; no lymphadenopathy.    RESP  Clear  P & A; w/o, wheezes/ rales/ or rhonchi. no accessory muscle use, no dullness to percussion  CARD:  RRR, no m/r/g, no peripheral edema, pulses intact, no cyanosis or clubbing.  GI:   Soft & nt; nml bowel sounds; no organomegaly or masses detected.   Musco: Warm bil, no deformities or joint swelling noted.   Neuro: alert, no focal deficits noted.    Skin: Warm, no lesions or rashes    Lab Results:  CBC  BMET  BNP No results found for: BNP  ProBNP No results found for: PROBNP  Imaging: Dg Chest 2 View  Result Date: 10/29/2017 CLINICAL  DATA:  Cough cough and congestion for 2 months, drainage from eyes and nose, scratchy throat EXAM: CHEST - 2 VIEW COMPARISON:  11/23/2016 FINDINGS: Upper normal heart size. Mediastinal contours and pulmonary vascularity normal. Atherosclerotic calcification aorta. Scarring in the mid lungs bilaterally and at RIGHT base. No acute infiltrate, pleural effusion or pneumothorax. Minimal chronic bronchitic changes. Bones demineralized. IMPRESSION: Bronchitic changes with scarring in both lungs. No acute abnormalities. Electronically Signed   By: Lavonia Dana M.D.   On: 10/29/2017 14:45     Assessment & Plan:   No problem-specific Assessment & Plan notes found for this encounter.     Rexene Edison, NP 11/12/2017

## 2017-11-12 NOTE — Assessment & Plan Note (Signed)
-   GI w/u 2004:  Mod BS nl, nl 24 h pH off all GI meds > rec d/c GERD rx  Per Dr Henrene Pastor  -PFT 2012 normal -no airflow obstruction, no restriction, moderately reduced diffusing capacity - increased neurontin to 200 tid 01/11/2013 > 300 mg tid as of 02/22/2013  -med calendar 03/10/13 and confirmed adherence -CT sinus 04/05/13 >Chronic sphenoid sinusitis. Slight nasal septal deviation right to left. -CT chest 04/05/13 >Patchy areas of paraseptal and central lobar emphysema. . Patchy ground-glass opacities suggest a partial airspace filling process and could reflect mild edema or inflammation. . Linear scarring changes in both lungs. - 08/23/2013 reporting 100% resolution on neurontin 300 tid rec taper off x one month  - 11/11/2017 re-eval for cough with  flare 08/2017 since uri ? Related to air travel> rec cyclical cough regimen and increase gabapentin back to 30 tid.    Of the three most common causes of  Sub-acute / recurrent or chronic cough, only one (GERD)  can actually contribute to/ trigger  the other two (asthma and post nasal drip syndrome)  and perpetuate the cylce of cough.  While not intuitively obvious, many patients with chronic low grade reflux do not cough until there is a primary insult that disturbs the protective epithelial barrier and exposes sensitive nerve endings.   This is typically viral but can due to PNDS and  either may apply here.     The point is that once this occurs, it is difficult to eliminate the cycle  using anything but a maximally effective acid suppression regimen at least in the short run, accompanied by an appropriate diet to address non acid GERD and control / eliminate the cough itself for at least 3 days with tramadol and elimnate all pnds with 1st gen H1 blockers per guidelines      Next step is return here for med reconciliation/ attempt to reduce the number of maint rx she is using as follows:   To keep things simple, I have asked the patient to first separate  medicines that are perceived as maintenance, that is to be taken daily "no matter what", from those medicines that are taken on only on an as-needed basis and I have given the patient examples of both, and then return to see our NP to generate a  detailed  medication calendar which should be followed until the next physician sees the patient and updates it.    If not better on a reasonable rx next steps are sinus ct and allergy profile/ FENO followed by methacholine challenge if in doubt.    Total time devoted to counseling  > 50 % of initial 60 min office visit:  review case with pt/ discussion of options/alternatives/ personally creating written customized instructions  in presence of pt  then going over those specific  Instructions directly with the pt including how to use all of the meds but in particular covering each new medication in detail and the difference between the maintenance= "automatic" meds and the prns using an action plan format for the latter (If this problem/symptom => do that organization reading Left to right).  Please see AVS from this visit for a full list of these instructions which I personally wrote for this pt and  are unique to this visit.

## 2017-11-12 NOTE — Patient Instructions (Signed)
Do not use Tramadol . Please be careful using sedating medications.  Follow med calendar closely and bring to each visit  Try delsym and tessalon As needed  Cough  Follow up in 3-4 weeks and As needed   Please contact office for sooner follow up if symptoms do not improve or worsen or seek emergency care

## 2017-11-12 NOTE — Telephone Encounter (Signed)
Spoke with an made apt for 4pm 11/12/17 patient verbalized understanding Nothing further needed.

## 2017-11-15 ENCOUNTER — Telehealth: Payer: Self-pay | Admitting: Internal Medicine

## 2017-11-15 ENCOUNTER — Encounter: Payer: Self-pay | Admitting: Adult Health

## 2017-11-15 NOTE — Telephone Encounter (Signed)
ATC patient but no voice mail will call back.

## 2017-11-15 NOTE — Assessment & Plan Note (Signed)
Chronic cough - recurrent  Continue on cough suprression regimen . Change Tramadol to Tessalon along with Deslym  Patient's medications were reviewed today and patient education was given. Computerized medication calendar was adjusted/completed   Plan  Patient Instructions  Do not use Tramadol . Please be careful using sedating medications.  Follow med calendar closely and bring to each visit  Try delsym and tessalon As needed  Cough  Follow up in 3-4 weeks and As needed   Please contact office for sooner follow up if symptoms do not improve or worsen or seek emergency care

## 2017-11-16 NOTE — Addendum Note (Signed)
Addended by: Parke Poisson E on: 11/16/2017 01:00 PM   Modules accepted: Orders

## 2017-11-16 NOTE — Telephone Encounter (Signed)
Patient states that she may have left her medication clander here she does not have it since her last ov with TP.   Jessica did you by chance have it or a copy for this patient ?

## 2017-11-17 NOTE — Telephone Encounter (Signed)
Will be happy to print another one off for her!  Would she like to pick it up or have it mailed?  Thanks!

## 2017-11-17 NOTE — Telephone Encounter (Signed)
Called and spoke with patient she would like this mailed to her. JJ please advise

## 2017-11-18 NOTE — Progress Notes (Signed)
Chart and office note reviewed in detail  > agree with a/p as outlined    

## 2017-11-22 NOTE — Telephone Encounter (Signed)
Called and spoke to patient. Made her aware MED Calender has been placed up front to be mailed off. Voiced understanding. Nothing further needed at this time.

## 2017-11-22 NOTE — Telephone Encounter (Signed)
So sorry for the delay Med calendar has been printed and placed in the mail for the patient ATC pt to make her aware - line rang numerous times with no answer and no option to LM

## 2017-11-24 ENCOUNTER — Telehealth: Payer: Self-pay | Admitting: Family

## 2017-11-24 NOTE — Telephone Encounter (Signed)
Copied from [place] [id number]. Topic: Quick Communication - See Telephone Encounter >> Nov 24, 2017 11:42 AM Ahmed Prima L wrote: CRM for notification. See Telephone encounter for: 11/24/17.  Patient is requesting the nurse to call her back regarding her amLODipine (NORVASC) 10 MG tablet. Patient was very hard to understand & only kept saying she wanted to talk to [place].

## 2017-11-25 ENCOUNTER — Other Ambulatory Visit: Payer: Self-pay

## 2017-11-25 MED ORDER — GABAPENTIN 300 MG PO CAPS: 300.0000 mg | ORAL_CAPSULE | Freq: Three times a day (TID) | ORAL | 3 refills | Status: DC

## 2017-11-25 MED ORDER — AMLODIPINE BESYLATE 10 MG PO TABS: 10.0000 mg | ORAL_TABLET | Freq: Every day | ORAL | 1 refills | Status: DC

## 2017-11-25 MED ORDER — FLUTICASONE PROPIONATE 50 MCG/ACT NA SUSP: 2.0000 | Freq: Every day | NASAL | 6 refills | Status: DC

## 2017-11-25 NOTE — Telephone Encounter (Signed)
Patient requesting rx for Gabapentin 300mg  tid and flonase.prescriptions pended for approval

## 2017-11-26 ENCOUNTER — Telehealth: Payer: Self-pay

## 2017-11-26 ENCOUNTER — Ambulatory Visit (INDEPENDENT_AMBULATORY_CARE_PROVIDER_SITE_OTHER): Payer: Medicare Other | Admitting: Family

## 2017-11-26 ENCOUNTER — Encounter: Payer: Self-pay | Admitting: Adult Health

## 2017-11-26 ENCOUNTER — Encounter: Payer: Self-pay | Admitting: Family

## 2017-11-26 VITALS — BP 128/61 | HR 79 | Temp 98.5°F | Resp 16 | Ht <= 58 in | Wt 137.0 lb

## 2017-11-26 DIAGNOSIS — R059 Cough, unspecified: Secondary | ICD-10-CM

## 2017-11-26 DIAGNOSIS — E039 Hypothyroidism, unspecified: Secondary | ICD-10-CM

## 2017-11-26 DIAGNOSIS — E785 Hyperlipidemia, unspecified: Secondary | ICD-10-CM | POA: Diagnosis not present

## 2017-11-26 DIAGNOSIS — E1140 Type 2 diabetes mellitus with diabetic neuropathy, unspecified: Secondary | ICD-10-CM

## 2017-11-26 DIAGNOSIS — R05 Cough: Secondary | ICD-10-CM

## 2017-11-26 DIAGNOSIS — I10 Essential (primary) hypertension: Secondary | ICD-10-CM

## 2017-11-26 NOTE — Patient Instructions (Signed)
Please follow up as scheduled.

## 2017-11-26 NOTE — Progress Notes (Signed)
Subjective:    Patient ID: Mary Flynn, female    DOB: Mar 17, 1934, 83 y.o.   MRN: [id number]  HPI   Cough- reports cough worsened and he saw Dr. Melvyn Novas on 11/11/17.  She is using delsym at night. Reports that she is also taking pepcid at night. Notes significant improvement in her cough symptoms.    DM2-she is maintained on Levemir, Humalog, metformin, Invokana, and Amaryl. Lab Results  Component Value Date   HGBA1C 7.9 (H) 10/29/2017   HGBA1C 7.7 (H) 04/05/2017   HGBA1C 7.6 11/27/2016   Lab Results  Component Value Date   MICROALBUR 1.5 02/04/2017   LDLCALC 35 02/04/2017   CREATININE 0.72 10/29/2017    HTN-blood pressure medications include Toprol-XL, amlodipine.  She is no longer taking Diovan due to cough.  BP Readings from Last 3 Encounters:  11/26/17 128/61  11/12/17 116/60  11/11/17 118/66     Review of Systems    see HPI  Past Medical History:  Diagnosis Date  . Diabetes mellitus   . Hyperlipidemia   . Hypertension   . Left rib fracture 02/18/2014     Social History   Socioeconomic History  . Marital status: Married    Spouse name: Jazmynn Pho  . Number of children: 2  . Years of education: College  . Highest education level: Not on file  Occupational History  . Occupation: housewife  Social Needs  . Financial resource strain: Not on file  . Food insecurity:    Worry: Not on file    Inability: Not on file  . Transportation needs:    Medical: Not on file    Non-medical: Not on file  Tobacco Use  . Smoking status: Never Smoker  . Smokeless tobacco: Never Used  Substance and Sexual Activity  . Alcohol use: No  . Drug use: No  . Sexual activity: Not Currently  Lifestyle  . Physical activity:    Days per week: Not on file    Minutes per session: Not on file  . Stress: Not on file  Relationships  . Social connections:    Talks on phone: Not on file    Gets together: Not on file    Attends religious service: Not on file    Active member  of club or organization: Not on file    Attends meetings of clubs or organizations: Not on file    Relationship status: Not on file  . Intimate partner violence:    Fear of current or ex partner: Not on file    Emotionally abused: Not on file    Physically abused: Not on file    Forced sexual activity: Not on file  Other Topics Concern  . Not on file  Social History Narrative   Patient lives at home with spouse.   Caffeine Use: rarely    Past Surgical History:  Procedure Laterality Date  . APPENDECTOMY    . REFRACTIVE SURGERY     Left Eye  . surgical sterilization  40 years ago    Family History  Problem Relation Age of Onset  . Diabetes Mother   . Hypertension Father   . Depression Unknown   . Cancer Unknown        laryngeal  . Diabetes Unknown     Allergies  Allergen Reactions  . Ace Inhibitors Cough  . Losartan Cough  . Penicillins Hives  . Sulfa Antibiotics Hives  . Sulfonamide Derivatives     Current Outpatient Medications on  File Prior to Visit  Medication Sig Dispense Refill  . amLODipine (NORVASC) 10 MG tablet Take 1 tablet (10 mg total) by mouth daily. 90 tablet 1  . aspirin EC 81 MG tablet Take 81 mg by mouth daily.    Marland Kitchen atorvastatin (LIPITOR) 10 MG tablet TAKE 1 TABLET BY MOUTH  EVERY DAY 90 tablet 1  . benzonatate (TESSALON) 200 MG capsule Take 1 capsule (200 mg total) by mouth 3 (three) times daily as needed for cough. 90 capsule 1  . Blood Glucose Monitoring Suppl (ONE TOUCH ULTRA 2) w/Device KIT USE AS DIRECTED TO TEST  BLOOD SUGAR TWO TIMES DAILY 1 each 1  . canagliflozin (INVOKANA) 100 MG TABS tablet Take 100 mg by mouth daily before breakfast.    . chlorpheniramine (CHLOR-TRIMETON) 4 MG tablet Take 4 mg by mouth every 4 (four) hours as needed (drainage, drippy nose, hoarseness).    Marland Kitchen dextromethorphan (DELSYM) 30 MG/5ML liquid 2 tsp twice daily as needed for cough    . DULoxetine (CYMBALTA) 30 MG capsule TAKE 1 CAPSULE BY MOUTH  DAILY 90 capsule 1    . fluticasone (FLONASE) 50 MCG/ACT nasal spray Place 2 sprays into both nostrils daily. 16 g 6  . gabapentin (NEURONTIN) 300 MG capsule Take 1 capsule (300 mg total) by mouth 3 (three) times daily. 90 capsule 3  . glimepiride (AMARYL) 4 MG tablet TAKE 1 TABLET BY MOUTH  TWICE A DAY 180 tablet 1  . glucose blood (ONE TOUCH ULTRA TEST) test strip Use to test blood sugar 2 times daily as instructed. Dx code: E11.49 200 each 3  . glucose blood (ONE TOUCH ULTRA TEST) test strip USE TO TEST BLOOD SUGARS  TWO TIMES DAILY 200 each 1  . Insulin Detemir (LEVEMIR FLEXPEN) 100 UNIT/ML Pen Inject 14 Units into the skin at bedtime.  15 mL 3  . insulin lispro (HUMALOG KWIKPEN) 100 UNIT/ML KiwkPen 9 units with breakfast and lunch, 4 units at evening meal    . Insulin Pen Needle 32G X 4 MM MISC Use 3 per day 100 each 3  . latanoprost (XALATAN) 0.005 % ophthalmic solution Place 1 drop into both eyes at bedtime.    Marland Kitchen levocetirizine (XYZAL) 5 MG tablet Take 1 tablet (5 mg total) by mouth every evening. 90 tablet 1  . levothyroxine (SYNTHROID, LEVOTHROID) 25 MCG tablet TAKE 1 TABLET BY MOUTH  DAILY BEFORE BREAKFAST 90 tablet 1  . Melatonin 5 MG TABS Take 1 tablet by mouth at bedtime as needed.    . metFORMIN (GLUCOPHAGE) 1000 MG tablet TAKE 1 TABLET BY MOUTH TWO  TIMES DAILY WITH MEALS 180 tablet 1  . metoprolol succinate (TOPROL-XL) 100 MG 24 hr tablet TAKE 1 TABLET BY MOUTH  DAILY WITH OR IMMEDIATELY  FOLLOWING A MEAL 90 tablet 1  . mirtazapine (REMERON) 7.5 MG tablet TAKE 1 TABLET BY MOUTH AT  BEDTIME 90 tablet 1  . montelukast (SINGULAIR) 10 MG tablet TAKE 1 TABLET BY MOUTH AT  BEDTIME 90 tablet 1  . niacin (NIASPAN) 500 MG CR tablet Take 2 tablets (1,000 mg total) by mouth at bedtime. 90 tablet 1  . NONFORMULARY OR COMPOUNDED ITEM Lumbar support belt  #1  As directed 1 each 0  . nystatin ointment (MYCOSTATIN) Apply 1 application topically 2 (two) times daily as needed.    . ONE TOUCH ULTRA TEST test strip TEST  TWO TIMES DAILY 200 each 2  . pantoprazole (PROTONIX) 40 MG tablet TAKE 1 TABLET BY MOUTH  DAILY 90 tablet 1  . vitamin E 400 UNIT capsule Take 400 Units by mouth daily.    . valsartan (DIOVAN) 320 MG tablet TAKE 1 TABLET BY MOUTH  DAILY (Patient not taking: Reported on 11/26/2017) 90 tablet 1   No current facility-administered medications on file prior to visit.     BP 128/61 (BP Location: Right Arm, Patient Position: Sitting, Cuff Size: Small)   Pulse 79   Temp 98.5 F (36.9 C) (Oral)   Resp 16   Ht _0  (1.473 m)   Wt 137 lb (62.1 kg)   SpO2 98%   BMI 28.63 kg/m    Objective:   Physical Exam  Constitutional: She appears well-developed and well-nourished.  Cardiovascular: Normal rate, regular rhythm and normal heart sounds.  No murmur heard. Pulmonary/Chest: Effort normal and breath sounds normal. No respiratory distress. She has no wheezes.  Psychiatric: She has a normal mood and affect. Her behavior is normal. Judgment and thought content normal.          Assessment & Plan:  Diabetes type 2-clinically stable, A1c is at goal for her age.  Continue current meds/doses.  Chronic cough- improving, management per pulmonary.  Hyperlipidemia-LDL at goal continue statin.  Hypothyroid- Lab Results  Component Value Date   TSH 2.33 10/29/2017   Stable on Synthroid, continue same.  Hypertension-blood pressure stable, continue current meds.

## 2017-11-26 NOTE — Telephone Encounter (Signed)
Author phoned pt. To assess interest in receiving prolia still, no answer. Chief Strategy Officer routed to [place], PCP, to assess need/need for new DEXA.

## 2017-11-29 NOTE — Telephone Encounter (Signed)
Yes, I still think prolia is a good idea for her. Her bones are quiet thin and she has already taken fosamax in the past.

## 2017-11-30 NOTE — Telephone Encounter (Signed)
Author phoned pt. Again re: prolia interest. No answer, unable to leave VM. Routed to [place], [place] and Rod Holler, [place] to readdress with pt. When pt. Presents to the office again.

## 2017-12-02 ENCOUNTER — Encounter: Payer: Self-pay | Admitting: Adult Health

## 2017-12-02 ENCOUNTER — Ambulatory Visit (INDEPENDENT_AMBULATORY_CARE_PROVIDER_SITE_OTHER): Payer: Medicare Other | Admitting: Adult Health

## 2017-12-02 DIAGNOSIS — R05 Cough: Secondary | ICD-10-CM | POA: Diagnosis not present

## 2017-12-02 DIAGNOSIS — R053 Chronic cough: Secondary | ICD-10-CM

## 2017-12-02 NOTE — Progress Notes (Signed)
$'@Patient'o$  ID: Mary Flynn, female    DOB: 03-08-1934, 83 y.o.   MRN: [id number]  Chief Complaint  Patient presents with  . Follow-up    Chronic cough    Referring provider: [name], NP  HPI: 83 year old [place] female never smoker followed for chronic cough seen for pulmonary consult  2012  12/02/2017 Follow up : Chronic Cough  Patient presents for a 2-week follow-up.  She was seen last visit for a recurrent cough.  Last been seen in 2015 for chronic cough.  She was placed on a cough suppression regimen at that time.  Says the cough did very well up until the last couple of months.  She has been placed on Delsym, Tessalon and gabapentin. Chest x-ray October 29, 2017 showed bronchitic changes.  Previous pulmonary function test in 2012 showed mild restriction with no airflow obstruction and a diffusing defect.  Previous CT chest 2017 showed stable linear scarring in both lungs stable groundglass opacities likely consistent with chronic inflammation. Since last visit she is feeling better her cough has decreased.  She is currently on Delsym 2 teaspoons twice daily.  And gabapentin 3 times daily.  She was not able to take the Tessalon as it is not covered by her insurance. She is on multiple medications and her family is concerned of her polypharmacy.  We discussed limiting sedating medicines as possible. Patient and husband have quite a bit of stress in their life as they have lost family members recently.  Support provided.  Allergies  Allergen Reactions  . Ace Inhibitors Cough  . Losartan Cough  . Penicillins Hives  . Sulfa Antibiotics Hives  . Sulfonamide Derivatives     Immunization History  Administered Date(s) Administered  . Influenza Split 11/24/2010  . Influenza Whole 11/30/2007, 12/05/2008, 12/04/2009  . Influenza, High Dose Seasonal PF 11/17/2013, 11/08/2014, 11/15/2015, 11/23/2016, 10/29/2017  . Influenza,inj,Quad PF,6+ Mos 11/18/2012  . PPD Test 02/16/2011,  04/14/2011  . Pneumococcal Conjugate-13 02/02/2014  . Pneumococcal Polysaccharide-23 12/09/2007  . Td 10/12/2006  . Zoster 06/04/2010    Past Medical History:  Diagnosis Date  . Diabetes mellitus   . Hyperlipidemia   . Hypertension   . Left rib fracture 02/18/2014    Tobacco History: Social History   Tobacco Use  Smoking Status Never Smoker  Smokeless Tobacco Never Used   Counseling given: Not Answered   Outpatient Medications Prior to Visit  Medication Sig Dispense Refill  . amLODipine (NORVASC) 10 MG tablet Take 1 tablet (10 mg total) by mouth daily. 90 tablet 1  . aspirin EC 81 MG tablet Take 81 mg by mouth daily.    Marland Kitchen atorvastatin (LIPITOR) 10 MG tablet TAKE 1 TABLET BY MOUTH  EVERY DAY 90 tablet 1  . Blood Glucose Monitoring Suppl (ONE TOUCH ULTRA 2) w/Device KIT USE AS DIRECTED TO TEST  BLOOD SUGAR TWO TIMES DAILY 1 each 1  . canagliflozin (INVOKANA) 100 MG TABS tablet Take 100 mg by mouth daily before breakfast.    . chlorpheniramine (CHLOR-TRIMETON) 4 MG tablet Take 4 mg by mouth every 4 (four) hours as needed (drainage, drippy nose, hoarseness).    Marland Kitchen dextromethorphan (DELSYM) 30 MG/5ML liquid 2 tsp twice daily as needed for cough    . DULoxetine (CYMBALTA) 30 MG capsule TAKE 1 CAPSULE BY MOUTH  DAILY 90 capsule 1  . fluticasone (FLONASE) 50 MCG/ACT nasal spray Place 2 sprays into both nostrils daily. 16 g 6  . gabapentin (NEURONTIN) 300 MG capsule Take 1  capsule (300 mg total) by mouth 3 (three) times daily. (Patient taking differently: Take 300 mg by mouth 2 (two) times daily. ) 90 capsule 3  . glimepiride (AMARYL) 4 MG tablet TAKE 1 TABLET BY MOUTH  TWICE A DAY 180 tablet 1  . glucose blood (ONE TOUCH ULTRA TEST) test strip Use to test blood sugar 2 times daily as instructed. Dx code: E11.49 200 each 3  . glucose blood (ONE TOUCH ULTRA TEST) test strip USE TO TEST BLOOD SUGARS  TWO TIMES DAILY 200 each 1  . Insulin Detemir (LEVEMIR FLEXPEN) 100 UNIT/ML Pen Inject 14  Units into the skin at bedtime.  15 mL 3  . insulin lispro (HUMALOG KWIKPEN) 100 UNIT/ML KiwkPen 9 units with breakfast and lunch, 4 units at evening meal    . Insulin Pen Needle 32G X 4 MM MISC Use 3 per day 100 each 3  . latanoprost (XALATAN) 0.005 % ophthalmic solution Place 1 drop into both eyes at bedtime.    Marland Kitchen levocetirizine (XYZAL) 5 MG tablet Take 1 tablet (5 mg total) by mouth every evening. 90 tablet 1  . levothyroxine (SYNTHROID, LEVOTHROID) 25 MCG tablet TAKE 1 TABLET BY MOUTH  DAILY BEFORE BREAKFAST 90 tablet 1  . Melatonin 5 MG TABS Take 1 tablet by mouth at bedtime as needed.    . metFORMIN (GLUCOPHAGE) 1000 MG tablet TAKE 1 TABLET BY MOUTH TWO  TIMES DAILY WITH MEALS 180 tablet 1  . metoprolol succinate (TOPROL-XL) 100 MG 24 hr tablet TAKE 1 TABLET BY MOUTH  DAILY WITH OR IMMEDIATELY  FOLLOWING A MEAL 90 tablet 1  . mirtazapine (REMERON) 7.5 MG tablet TAKE 1 TABLET BY MOUTH AT  BEDTIME 90 tablet 1  . montelukast (SINGULAIR) 10 MG tablet TAKE 1 TABLET BY MOUTH AT  BEDTIME 90 tablet 1  . niacin (NIASPAN) 500 MG CR tablet Take 2 tablets (1,000 mg total) by mouth at bedtime. 90 tablet 1  . NONFORMULARY OR COMPOUNDED ITEM Lumbar support belt  #1  As directed 1 each 0  . nystatin ointment (MYCOSTATIN) Apply 1 application topically 2 (two) times daily as needed.    . ONE TOUCH ULTRA TEST test strip TEST TWO TIMES DAILY 200 each 2  . pantoprazole (PROTONIX) 40 MG tablet TAKE 1 TABLET BY MOUTH  DAILY 90 tablet 1  . vitamin E 400 UNIT capsule Take 400 Units by mouth daily.    . benzonatate (TESSALON) 200 MG capsule Take 1 capsule (200 mg total) by mouth 3 (three) times daily as needed for cough. (Patient not taking: Reported on 12/02/2017) 90 capsule 1   No facility-administered medications prior to visit.      Review of Systems  Constitutional:   No  weight loss, night sweats,  Fevers, chills, fatigue, or  lassitude.  HEENT:   No headaches,  Difficulty swallowing,  Tooth/dental  problems, or  Sore throat,                No sneezing, itching, ear ache,  +nasal congestion, post nasal drip,   CV:  No chest pain,  Orthopnea, PND, swelling in lower extremities, anasarca, dizziness, palpitations, syncope.   GI  No heartburn, indigestion, abdominal pain, nausea, vomiting, diarrhea, change in bowel habits, loss of appetite, bloody stools.   Resp:  .  No chest wall deformity  Skin: no rash or lesions.  GU: no dysuria, change in color of urine, no urgency or frequency.  No flank pain, no hematuria   MS:  No joint pain or swelling.  No decreased range of motion.  No back pain.    Physical Exam  BP 114/76 (BP Location: Left Arm, Cuff Size: Normal)   Pulse 85   Ht '4\' 10"'$  (1.473 m)   Wt 144 lb (65.3 kg)   SpO2 95%   BMI 30.10 kg/m   GEN: A/Ox3; pleasant , NAD, elderly    HEENT:  [place]/AT,  EACs-clear, TMs-wnl, NOSE-clear, THROAT-clear, no lesions, no postnasal drip or exudate noted.   NECK:  Supple w/ fair ROM; no JVD; normal carotid impulses w/o bruits; no thyromegaly or nodules palpated; no lymphadenopathy.    RESP  Clear  P & A; w/o, wheezes/ rales/ or rhonchi. no accessory muscle use, no dullness to percussion  CARD:  RRR, no m/r/g, no peripheral edema, pulses intact, no cyanosis or clubbing.  GI:   Soft & nt; nml bowel sounds; no organomegaly or masses detected.   Musco: Warm bil, no deformities or joint swelling noted.   Neuro: alert, no focal deficits noted.    Skin: Warm, no lesions or rashes    Lab Results:  CBC  BNP No results found for: BNP  ProBNP No results found for: PROBNP  Imaging: No results found.   Assessment & Plan:   Chronic cough Chronic cough with extensive work-up over the years previous barium swallow in 2004 normal, PFTs in 2012 with no airflow obstruction , CT sinus in 2015 with chronic sinusitis.  CT chest 2015 with linear scarring and patchy groundglass opacities.  Patient has improved with treatment of chronic  rhinitis, GERD and gabapentin.  She has also underlying diabetes with possible diabetic neuropathy.  He is remained on gabapentin over the years and says it does help with her leg pain. Patient's cough is improved currently.  Most recent chest x-ray shows stable bronchitic changes.  Will decrease gabapentin to twice daily.  Patient is on multiple medications and has balance issues.  We will try to decrease the sedating medication exposure. If symptoms return or worsen can consider a high-resolution CT chest.   Plan  Patient Instructions  Decrease Gabapentin '300mg'$  Twice daily  .  Use Delsym 2 tsp Twice daily  For cough As needed   Follow up in 2- 3 months and As needed   Please contact office for sooner follow up if symptoms do not improve or worsen or seek emergency care           Rexene Edison, NP 12/02/2017

## 2017-12-02 NOTE — Patient Instructions (Addendum)
Decrease Gabapentin 300mg  Twice daily  .  Use Delsym 2 tsp Twice daily  For cough As needed   Follow up in 2- 3 months and As needed   Please contact office for sooner follow up if symptoms do not improve or worsen or seek emergency care

## 2017-12-02 NOTE — Progress Notes (Signed)
Chart and office note reviewed in detail  > agree with a/p as outlined    

## 2017-12-02 NOTE — Assessment & Plan Note (Signed)
Chronic cough with extensive work-up over the years previous barium swallow in 2004 normal, PFTs in 2012 with no airflow obstruction , CT sinus in 2015 with chronic sinusitis.  CT chest 2015 with linear scarring and patchy groundglass opacities.  Patient has improved with treatment of chronic rhinitis, GERD and gabapentin.  She has also underlying diabetes with possible diabetic neuropathy.  He is remained on gabapentin over the years and says it does help with her leg pain. Patient's cough is improved currently.  Most recent chest x-ray shows stable bronchitic changes.  Will decrease gabapentin to twice daily.  Patient is on multiple medications and has balance issues.  We will try to decrease the sedating medication exposure. If symptoms return or worsen can consider a high-resolution CT chest.   Plan  Patient Instructions  Decrease Gabapentin 300mg  Twice daily  .  Use Delsym 2 tsp Twice daily  For cough As needed   Follow up in 2- 3 months and As needed   Please contact office for sooner follow up if symptoms do not improve or worsen or seek emergency care

## 2017-12-03 ENCOUNTER — Ambulatory Visit: Payer: Self-pay | Admitting: Adult Health

## 2017-12-06 ENCOUNTER — Telehealth: Payer: Self-pay | Admitting: Adult Health

## 2017-12-06 NOTE — Telephone Encounter (Signed)
Patient called requesting Tammy, NP, call her tomorrow 12/07/17.  She stated that she had questions about several medications she is on and would like to know if Tammy thinks it is ok for her to take.  Mirtazapine and glimepiride, she stated that her Husband read that they should not be taken together.  Explained that Tammy had left for today, but I would give her the message.  Will route to Tammy P and Janett Billow to follow up

## 2017-12-06 NOTE — Telephone Encounter (Signed)
Attempted to call pt but unable to reach her and unable to leave a message due to no VM kicking in.  Have pended the pepcid Rx until pt returns call so we can get the other information taken care of for her.

## 2017-12-07 MED ORDER — FAMOTIDINE 20 MG PO TABS: 20.0000 mg | ORAL_TABLET | Freq: Every day | ORAL | 3 refills | Status: DC

## 2017-12-07 NOTE — Telephone Encounter (Signed)
pepcid sent to mail order for 3 month supply .  Advised not to take flexeril as makes too sleepy  Would not mix sedating rx .

## 2017-12-08 MED ORDER — FAMOTIDINE 20 MG PO TABS: 20.0000 mg | ORAL_TABLET | Freq: Every day | ORAL | 2 refills | Status: DC

## 2017-12-08 NOTE — Telephone Encounter (Signed)
Called and spoke to patient, requesting refill of pepcid, order sent. Patient would also like to know if there is any contraindication of Taking Flexeril and Remeron at night. She states her husband read in a article they should not be taken together and she would like MW opinion.   MW please advise if okay to take Flexeril and Remeron together.

## 2017-12-08 NOTE — Telephone Encounter (Signed)
Called and spoke with pt letting her know the information stated per MW and that there was no reason why she could not combine the flexeril and remeron at night. Also stated to her that [place] had sent a refill of the pepcid to [facility] per her request.  I stated to her after this refill that PCP will need to take over the refills as we do not have a follow up scheduled but stated to her if she had any resp problems by the end of the year that we would need her to schedule an appt.  Pt expressed understanding. Nothing further needed.

## 2017-12-08 NOTE — Telephone Encounter (Signed)
They are both sedating but no reason therefore can't combine at hs  Ok to refill pepcid until she sees her PCP but only until then as we don't have any scheduled f/u > if having resp problems needs appt by end of year

## 2017-12-13 ENCOUNTER — Telehealth: Payer: Self-pay | Admitting: Adult Health

## 2017-12-13 NOTE — Telephone Encounter (Signed)
Called and spoke with pt who stated Thursday, 10/18 she developed a cold with runny nose and cough with white phlegm. Pt states she will start developing a tickle in her throat and once that happens, she begins to cough.  Pt denies any fever.  Pt stated she has been taking delsym but states the delsym is not helping her cough anymore.  Dr. Melvyn Novas, please advise recs for pt. Thanks!

## 2017-12-13 NOTE — Telephone Encounter (Signed)
Attempted to call pt but no answer and unable to leave a message due to no VM kicking in.  Will try to call back later. If pt does return call, pt needs to be scheduled for an acute visit.

## 2017-12-13 NOTE — Telephone Encounter (Signed)
Called and spoke with pt letting her know MW said we needed to get her to come in for an OV. Pt expressed understanding.  Scheduled pt for an acute visit with TP tomorrow, 10/22 at 10:30. Nothing further needed.

## 2017-12-13 NOTE — Telephone Encounter (Signed)
Ov with all meds in hand to see me or NP, can't sort this out over the phone

## 2017-12-14 ENCOUNTER — Encounter: Payer: Self-pay | Admitting: Adult Health

## 2017-12-14 ENCOUNTER — Ambulatory Visit (INDEPENDENT_AMBULATORY_CARE_PROVIDER_SITE_OTHER): Payer: Medicare Other | Admitting: Adult Health

## 2017-12-14 DIAGNOSIS — R05 Cough: Secondary | ICD-10-CM

## 2017-12-14 DIAGNOSIS — J309 Allergic rhinitis, unspecified: Secondary | ICD-10-CM | POA: Diagnosis not present

## 2017-12-14 DIAGNOSIS — R053 Chronic cough: Secondary | ICD-10-CM

## 2017-12-14 MED ORDER — AZITHROMYCIN 250 MG PO TABS: ORAL_TABLET | ORAL | 0 refills | Status: AC

## 2017-12-14 MED FILL — AZITHROMYCIN 250 MG TABLET: 250 | 5 days supply | Qty: 6 | Fill #0

## 2017-12-14 NOTE — Progress Notes (Signed)
_0  ID: Mary Flynn, female    DOB: 04/03/1934, 83 y.o.   MRN: [id number]  Chief Complaint  Patient presents with  . Acute Visit    Cough     Referring provider: [name], NP  HPI: 83 year old [place] female never smoker followed for chronic cough seen for pulmonary consult  2012 Medical history significant for diabetes, hypertension, hyperlipidemia  TEST /Events  PFT  2012 showed mild restriction with no airflow obstruction and a diffusing defect.    CT chest 2017 showed stable linear scarring in both lungs stable groundglass opacities likely consistent with chronic inflammation. CT sinus February 2015 chronic sphenoid sinusitis septal deviation Chest x-ray September 2019 bronchitic changes with scarring in both lungs  12/14/2017 Acute OV : Cough  Patient presents for an acute office visit.  She complains over the last 4 days cough has increased with sinus drainage and throat clearing . Mucus is clear to white. Has runny nose.  No fever, chest pain , wheezing or edema.  Feels she caught a cold and allergies may be contributing .  Seen 2 weeks ago with improvement in cough . Gabapentin was decreased to Twice daily  .  Says not sure if this made a difference or not as she was missing there 3 rd dose many days anyway.  Chest xray last month with no acute process.  She restarted delsym only at night without much help. Not taking tessalon. Has some left but is not able to afford to get as insurance does not cover.    Allergies  Allergen Reactions  . Ace Inhibitors Cough  . Losartan Cough  . Penicillins Hives  . Sulfa Antibiotics Hives  . Sulfonamide Derivatives     Immunization History  Administered Date(s) Administered  . Influenza Split 11/24/2010  . Influenza Whole 11/30/2007, 12/05/2008, 12/04/2009  . Influenza, High Dose Seasonal PF 11/17/2013, 11/08/2014, 11/15/2015, 11/23/2016, 10/29/2017  . Influenza,inj,Quad PF,6+ Mos 11/18/2012  . PPD Test  02/16/2011, 04/14/2011  . Pneumococcal Conjugate-13 02/02/2014  . Pneumococcal Polysaccharide-23 12/09/2007  . Td 10/12/2006  . Zoster 06/04/2010    Past Medical History:  Diagnosis Date  . Diabetes mellitus   . Hyperlipidemia   . Hypertension   . Left rib fracture 02/18/2014    Tobacco History: Social History   Tobacco Use  Smoking Status Never Smoker  Smokeless Tobacco Never Used   Counseling given: Not Answered   Outpatient Medications Prior to Visit  Medication Sig Dispense Refill  . amLODipine (NORVASC) 10 MG tablet Take 1 tablet (10 mg total) by mouth daily. 90 tablet 1  . aspirin EC 81 MG tablet Take 81 mg by mouth daily.    Marland Kitchen atorvastatin (LIPITOR) 10 MG tablet TAKE 1 TABLET BY MOUTH  EVERY DAY 90 tablet 1  . Blood Glucose Monitoring Suppl (ONE TOUCH ULTRA 2) w/Device KIT USE AS DIRECTED TO TEST  BLOOD SUGAR TWO TIMES DAILY 1 each 1  . canagliflozin (INVOKANA) 100 MG TABS tablet Take 100 mg by mouth daily before breakfast.    . chlorpheniramine (CHLOR-TRIMETON) 4 MG tablet Take 4 mg by mouth every 4 (four) hours as needed (drainage, drippy nose, hoarseness).    Marland Kitchen dextromethorphan (DELSYM) 30 MG/5ML liquid 2 tsp twice daily as needed for cough    . DULoxetine (CYMBALTA) 30 MG capsule TAKE 1 CAPSULE BY MOUTH  DAILY 90 capsule 1  . famotidine (PEPCID) 20 MG tablet Take 1 tablet (20 mg total) by mouth at bedtime. 90 tablet 3  .  fluticasone (FLONASE) 50 MCG/ACT nasal spray Place 2 sprays into both nostrils daily. 16 g 6  . gabapentin (NEURONTIN) 300 MG capsule Take 1 capsule (300 mg total) by mouth 3 (three) times daily. (Patient taking differently: Take 300 mg by mouth 2 (two) times daily. ) 90 capsule 3  . glimepiride (AMARYL) 4 MG tablet TAKE 1 TABLET BY MOUTH  TWICE A DAY 180 tablet 1  . glucose blood (ONE TOUCH ULTRA TEST) test strip Use to test blood sugar 2 times daily as instructed. Dx code: E11.49 200 each 3  . glucose blood (ONE TOUCH ULTRA TEST) test strip USE TO  TEST BLOOD SUGARS  TWO TIMES DAILY 200 each 1  . Insulin Detemir (LEVEMIR FLEXPEN) 100 UNIT/ML Pen Inject 14 Units into the skin at bedtime.  15 mL 3  . insulin lispro (HUMALOG KWIKPEN) 100 UNIT/ML KiwkPen 9 units with breakfast and lunch, 4 units at evening meal    . Insulin Pen Needle 32G X 4 MM MISC Use 3 per day 100 each 3  . latanoprost (XALATAN) 0.005 % ophthalmic solution Place 1 drop into both eyes at bedtime.    Marland Kitchen levocetirizine (XYZAL) 5 MG tablet Take 1 tablet (5 mg total) by mouth every evening. 90 tablet 1  . levothyroxine (SYNTHROID, LEVOTHROID) 25 MCG tablet TAKE 1 TABLET BY MOUTH  DAILY BEFORE BREAKFAST 90 tablet 1  . Melatonin 5 MG TABS Take 1 tablet by mouth at bedtime as needed.    . metFORMIN (GLUCOPHAGE) 1000 MG tablet TAKE 1 TABLET BY MOUTH TWO  TIMES DAILY WITH MEALS 180 tablet 1  . metoprolol succinate (TOPROL-XL) 100 MG 24 hr tablet TAKE 1 TABLET BY MOUTH  DAILY WITH OR IMMEDIATELY  FOLLOWING A MEAL 90 tablet 1  . mirtazapine (REMERON) 7.5 MG tablet TAKE 1 TABLET BY MOUTH AT  BEDTIME 90 tablet 1  . montelukast (SINGULAIR) 10 MG tablet TAKE 1 TABLET BY MOUTH AT  BEDTIME 90 tablet 1  . niacin (NIASPAN) 500 MG CR tablet Take 2 tablets (1,000 mg total) by mouth at bedtime. 90 tablet 1  . NONFORMULARY OR COMPOUNDED ITEM Lumbar support belt  #1  As directed 1 each 0  . ONE TOUCH ULTRA TEST test strip TEST TWO TIMES DAILY 200 each 2  . pantoprazole (PROTONIX) 40 MG tablet TAKE 1 TABLET BY MOUTH  DAILY 90 tablet 1  . vitamin E 400 UNIT capsule Take 400 Units by mouth daily.    . famotidine (PEPCID) 20 MG tablet Take 1 tablet (20 mg total) by mouth at bedtime. 90 tablet 2  . nystatin ointment (MYCOSTATIN) Apply 1 application topically 2 (two) times daily as needed.     No facility-administered medications prior to visit.      Review of Systems  Constitutional:   No  weight loss, night sweats,  Fevers, chills, + fatigue, or  lassitude.  HEENT:   No headaches,  Difficulty  swallowing,  Tooth/dental problems, or  Sore throat,                No sneezing, itching, ear ache,  +nasal congestion, post nasal drip,   CV:  No chest pain,  Orthopnea, PND, swelling in lower extremities, anasarca, dizziness, palpitations, syncope.   GI  No heartburn, indigestion, abdominal pain, nausea, vomiting, diarrhea, change in bowel habits, loss of appetite, bloody stools.   Resp:    No chest wall deformity  Skin: no rash or lesions.  GU: no dysuria, change in color of  urine, no urgency or frequency.  No flank pain, no hematuria   MS:  No joint pain or swelling.  No decreased range of motion.  No back pain.    Physical Exam  BP 124/80 (BP Location: Left Arm, Cuff Size: Normal)   Pulse 94   Temp 97.8 F (36.6 C) (Oral)   Ht _0  (1.473 m)   Wt 137 lb 6.4 oz (62.3 kg)   SpO2 98%   BMI 28.72 kg/m   GEN: A/Ox3; pleasant , NAD, elderly    HEENT:  [place]/AT,  EACs-clear, TMs-wnl, NOSE-clear drainage , THROAT-clear, no lesions, no postnasal drip or exudate noted.   NECK:  Supple w/ fair ROM; no JVD; normal carotid impulses w/o bruits; no thyromegaly or nodules palpated; no lymphadenopathy.    RESP  Clear  P & A; w/o, wheezes/ rales/ or rhonchi. no accessory muscle use, no dullness to percussion  CARD:  RRR, no m/r/g, no peripheral edema, pulses intact, no cyanosis or clubbing.  GI:   Soft & nt; nml bowel sounds; no organomegaly or masses detected.   Musco: Warm bil, no deformities or joint swelling noted.   Neuro: alert, no focal deficits noted.    Skin: Warm, no lesions or rashes    Lab Results:  CBC   No results found for: BNP  ProBNP No results found for: PROBNP  Imaging: No results found.   Assessment & Plan:   Chronic cough Flare with URI/AR - suspect this is allergy related . Possible viral as well.  Increase cough control regimen , control for triggers .  Hold on abx - will give to have on hold if worsens.   Plan  Patient Instructions    Begin Delsym 2 teaspoons twice daily for 1 week then as needed for cough .  Begin Tessalon 3 times daily for cough for 1 week then as needed for cough .  Increase Gabapentin 100 mg 3 times daily.  Continue on Flonase, Singulair  and Xyzal .  Zpack to have on hold if symptoms worsen with discolored mucus  Follow up as planned and As needed   Please contact office for sooner follow up if symptoms do not improve or worsen or seek emergency care         Allergic rhinitis Flare   Plan  Patient Instructions  Begin Delsym 2 teaspoons twice daily for 1 week then as needed for cough .  Begin Tessalon 3 times daily for cough for 1 week then as needed for cough .  Increase Gabapentin 100 mg 3 times daily.  Continue on Flonase, Singulair  and Xyzal .  Zpack to have on hold if symptoms worsen with discolored mucus  Follow up as planned and As needed   Please contact office for sooner follow up if symptoms do not improve or worsen or seek emergency care            Rexene Edison, NP 12/14/2017

## 2017-12-14 NOTE — Assessment & Plan Note (Signed)
Flare   Plan  Patient Instructions  Begin Delsym 2 teaspoons twice daily for 1 week then as needed for cough .  Begin Tessalon 3 times daily for cough for 1 week then as needed for cough .  Increase Gabapentin 100 mg 3 times daily.  Continue on Flonase, Singulair  and Xyzal .  Zpack to have on hold if symptoms worsen with discolored mucus  Follow up as planned and As needed   Please contact office for sooner follow up if symptoms do not improve or worsen or seek emergency care

## 2017-12-14 NOTE — Patient Instructions (Addendum)
Begin Delsym 2 teaspoons twice daily for 1 week then as needed for cough .  Begin Tessalon 3 times daily for cough for 1 week then as needed for cough .  Increase Gabapentin 100 mg 3 times daily.  Continue on Flonase, Singulair  and Xyzal .  Zpack to have on hold if symptoms worsen with discolored mucus  Follow up as planned and As needed   Please contact office for sooner follow up if symptoms do not improve or worsen or seek emergency care

## 2017-12-14 NOTE — Assessment & Plan Note (Signed)
Flare with URI/AR - suspect this is allergy related . Possible viral as well.  Increase cough control regimen , control for triggers .  Hold on abx - will give to have on hold if worsens.   Plan  Patient Instructions  Begin Delsym 2 teaspoons twice daily for 1 week then as needed for cough .  Begin Tessalon 3 times daily for cough for 1 week then as needed for cough .  Increase Gabapentin 100 mg 3 times daily.  Continue on Flonase, Singulair  and Xyzal .  Zpack to have on hold if symptoms worsen with discolored mucus  Follow up as planned and As needed   Please contact office for sooner follow up if symptoms do not improve or worsen or seek emergency care

## 2017-12-21 ENCOUNTER — Encounter: Payer: Self-pay | Admitting: Podiatry

## 2017-12-21 ENCOUNTER — Other Ambulatory Visit (INDEPENDENT_AMBULATORY_CARE_PROVIDER_SITE_OTHER): Payer: Medicare Other

## 2017-12-21 ENCOUNTER — Ambulatory Visit (INDEPENDENT_AMBULATORY_CARE_PROVIDER_SITE_OTHER): Payer: Medicare Other | Admitting: Podiatry

## 2017-12-21 ENCOUNTER — Ambulatory Visit (INDEPENDENT_AMBULATORY_CARE_PROVIDER_SITE_OTHER): Payer: Medicare Other

## 2017-12-21 DIAGNOSIS — E785 Hyperlipidemia, unspecified: Secondary | ICD-10-CM

## 2017-12-21 DIAGNOSIS — I10 Essential (primary) hypertension: Secondary | ICD-10-CM

## 2017-12-21 DIAGNOSIS — M722 Plantar fascial fibromatosis: Secondary | ICD-10-CM

## 2017-12-21 DIAGNOSIS — M7661 Achilles tendinitis, right leg: Secondary | ICD-10-CM | POA: Diagnosis not present

## 2017-12-21 LAB — COMPREHENSIVE METABOLIC PANEL
ALT: 18 U/L (ref 0–35)
AST: 18 U/L (ref 0–37)
Albumin: 4.6 g/dL (ref 3.5–5.2)
Alkaline Phosphatase: 53 U/L (ref 39–117)
BUN: 11 mg/dL (ref 6–23)
CALCIUM: 9.4 mg/dL (ref 8.4–10.5)
CO2: 30 mEq/L (ref 19–32)
Chloride: 100 mEq/L (ref 96–112)
Creatinine, Ser: 0.71 mg/dL (ref 0.40–1.20)
GFR: 83.93 mL/min (ref >60.00)
GLUCOSE: 135 mg/dL — AB (ref 70–99)
POTASSIUM: 4.5 meq/L (ref 3.5–5.1)
Sodium: 138 mEq/L (ref 135–145)
TOTAL PROTEIN: 6.8 g/dL (ref 6.0–8.3)
Total Bilirubin: 1.4 mg/dL — ABNORMAL HIGH (ref 0.2–1.2)

## 2017-12-21 LAB — LIPID PANEL
CHOL/HDL RATIO: 2
Cholesterol: 113 mg/dL (ref 0–200)
HDL: 50 mg/dL (ref >39.00)
LDL Cholesterol: 42 mg/dL (ref 0–99)
NONHDL: 63.19
Triglycerides: 108 mg/dL (ref 0.0–149.0)
VLDL: 21.6 mg/dL (ref 0.0–40.0)

## 2017-12-21 NOTE — Progress Notes (Signed)
She presents today chief complaint of painful right heel.  States that anytime she walks on it is painful denies any trauma.  States that it does not bother her as long as she is nonweightbearing.  Objective: Vital signs are stable she is alert and oriented x3 pulses are palpable.  No fractures on radiographs.  She does demonstrate pain on palpation medial calcaneal tubercle.  Soft tissue increase in density plantar calcaneal insertion site indicative of plantar fasciitis on x-ray.  Assessment: Plantar fasciitis right heel.  Plan: After sterile Betadine skin prep injected 20 mg Kenalog 5 mg Marcaine point maximal tenderness right heel posterior and plantar fascial brace.  For follow-up with her in 1 month if necessary.

## 2017-12-21 NOTE — Patient Instructions (Signed)

## 2017-12-23 ENCOUNTER — Telehealth: Payer: Self-pay | Admitting: Internal Medicine

## 2017-12-23 ENCOUNTER — Other Ambulatory Visit: Payer: Self-pay

## 2017-12-23 DIAGNOSIS — R17 Unspecified jaundice: Secondary | ICD-10-CM

## 2017-12-23 NOTE — Telephone Encounter (Signed)
Nothing else to rec over the phone  rec ov with all meds in hand including otcs

## 2017-12-23 NOTE — Telephone Encounter (Signed)
Pt states she is going out of [place] on [place]. 14. Cb is [phone].

## 2017-12-23 NOTE — Telephone Encounter (Signed)
Spoke with patient. She has been scheduled for an OV on 12/28/17 at 1115. She verbalized understanding. Nothing further needed at time of call.

## 2017-12-23 NOTE — Telephone Encounter (Signed)
Spoke with patient. She stated that she has not seen any improvement in her cough since she saw TP on 12/14/17. She stated that every time she eats or drinks, she coughs. So far the mucus has been clear. The cough has been so deep at times that she will develop SOB. Denied any wheezing, body aches or fever.   She wants to know if she could have something called in for her. If it is a short-term medication, she prefers the [facility] hospital in [place]. If it is a long-term medication, she prefers [facility].   MW, please advise. Thanks!

## 2017-12-23 NOTE — Telephone Encounter (Signed)
ATC patient. She did not answer and her VM is not setup. Will call back later.

## 2017-12-24 ENCOUNTER — Other Ambulatory Visit: Payer: Self-pay | Admitting: Family

## 2017-12-24 ENCOUNTER — Telehealth: Payer: Self-pay | Admitting: Internal Medicine

## 2017-12-24 MED ORDER — CHLORPHENIRAMINE MALEATE 4 MG PO TABS: 4.0000 mg | ORAL_TABLET | ORAL | 3 refills | Status: DC | PRN

## 2017-12-24 NOTE — Telephone Encounter (Signed)
Spoke with the pt She is requesting rx for chlorpheniramine to be sent to [facility]  I have sent this in  Nothing further needed

## 2017-12-27 ENCOUNTER — Ambulatory Visit (INDEPENDENT_AMBULATORY_CARE_PROVIDER_SITE_OTHER): Payer: Medicare Other | Admitting: Family

## 2017-12-27 ENCOUNTER — Encounter: Payer: Self-pay | Admitting: Family

## 2017-12-27 ENCOUNTER — Telehealth: Payer: Self-pay | Admitting: Family

## 2017-12-27 VITALS — BP 136/66 | HR 83 | Resp 18 | Ht 58.75 in | Wt 138.4 lb

## 2017-12-27 DIAGNOSIS — R17 Unspecified jaundice: Secondary | ICD-10-CM | POA: Diagnosis not present

## 2017-12-27 DIAGNOSIS — Z0000 Encounter for general adult medical examination without abnormal findings: Secondary | ICD-10-CM

## 2017-12-27 DIAGNOSIS — Z1239 Encounter for other screening for malignant neoplasm of breast: Secondary | ICD-10-CM

## 2017-12-27 MED ORDER — ZOSTER VAC RECOMB ADJUVANTED 50 MCG/0.5ML IM SUSR: INTRAMUSCULAR | 1 refills | Status: DC

## 2017-12-27 MED ORDER — NYSTATIN 100000 UNIT/GM EX OINT: 1.0000 "application " | TOPICAL_OINTMENT | Freq: Two times a day (BID) | CUTANEOUS | 0 refills | Status: DC | PRN

## 2017-12-27 NOTE — Patient Instructions (Addendum)
Please go to [facility] for your shingles shot.  Complete lab work prior to leaving.

## 2017-12-27 NOTE — Progress Notes (Signed)
Subjective:    Patient ID: Mary Flynn, female    DOB: 21-Feb-1935, 83 y.o.   MRN: [id number]  HPI  Patient presents today for complete physical.  Immunizations: Tetanus is due but not covered by insurance.  Flu shot up-to-date.  Shingrix due.  Pneumovax and Prevnar are up-to-date. Diet: healthy (vegetarian) Wt Readings from Last 3 Encounters:  12/27/17 138 lb 6.4 oz (62.8 kg)  12/14/17 137 lb 6.4 oz (62.3 kg)  12/02/17 144 lb (65.3 kg)  Exercise: limited due to right foot pain, does walk Colonoscopy: Last colonoscopy 02/01/2008  Dexa: 12/31/2016 Pap Smear: Hysterectomy Mammogram: 05/25/2016 Vision:  Reports up to date (Dr. Katy Fitch) Dental: dentures.    Reports ongoing cough and will be seeing Dr. Melvyn Novas tomorrow about this.     Review of Systems  Constitutional: Negative for unexpected weight change.  HENT: Positive for rhinorrhea.   Eyes: Negative for visual disturbance.  Respiratory: Positive for cough.   Cardiovascular: Negative for leg swelling.  Gastrointestinal: Negative for constipation and diarrhea.  Genitourinary: Negative for dysuria and frequency.       Some leaking at night on her way to the bathroom and with coughing  Musculoskeletal:       Reports that improvement in her FM pain with cyclobenzaprine and cymbalta  Skin: Negative for rash.  Neurological: Negative for headaches.  Hematological: Negative for adenopathy.  Psychiatric/Behavioral:       Denies depression   Past Medical History:  Diagnosis Date  . Diabetes mellitus   . Hyperlipidemia   . Hypertension   . Left rib fracture 02/18/2014     Social History   Socioeconomic History  . Marital status: Married    Spouse name: Shakara Tweedy  . Number of children: 2  . Years of education: College  . Highest education level: Not on file  Occupational History  . Occupation: housewife  Social Needs  . Financial resource strain: Not on file  . Food insecurity:    Worry: Not on file    Inability: Not  on file  . Transportation needs:    Medical: Not on file    Non-medical: Not on file  Tobacco Use  . Smoking status: Never Smoker  . Smokeless tobacco: Never Used  Substance and Sexual Activity  . Alcohol use: No  . Drug use: No  . Sexual activity: Not Currently  Lifestyle  . Physical activity:    Days per week: Not on file    Minutes per session: Not on file  . Stress: Not on file  Relationships  . Social connections:    Talks on phone: Not on file    Gets together: Not on file    Attends religious service: Not on file    Active member of club or organization: Not on file    Attends meetings of clubs or organizations: Not on file    Relationship status: Not on file  . Intimate partner violence:    Fear of current or ex partner: Not on file    Emotionally abused: Not on file    Physically abused: Not on file    Forced sexual activity: Not on file  Other Topics Concern  . Not on file  Social History Narrative   Patient lives at home with spouse.   Caffeine Use: rarely    Past Surgical History:  Procedure Laterality Date  . APPENDECTOMY    . REFRACTIVE SURGERY     Left Eye  . surgical sterilization  40  years ago    Family History  Problem Relation Age of Onset  . Diabetes Mother   . Hypertension Father   . Depression Unknown   . Cancer Unknown        laryngeal  . Diabetes Unknown     Allergies  Allergen Reactions  . Ace Inhibitors Cough  . Losartan Cough  . Penicillins Hives  . Sulfa Antibiotics Hives  . Sulfonamide Derivatives     Current Outpatient Medications on File Prior to Visit  Medication Sig Dispense Refill  . amLODipine (NORVASC) 10 MG tablet Take 1 tablet (10 mg total) by mouth daily. 90 tablet 1  . aspirin EC 81 MG tablet Take 81 mg by mouth daily.    Marland Kitchen atorvastatin (LIPITOR) 10 MG tablet TAKE 1 TABLET BY MOUTH  EVERY DAY 90 tablet 1  . Blood Glucose Monitoring Suppl (ONE TOUCH ULTRA 2) w/Device KIT USE AS DIRECTED TO TEST  BLOOD SUGAR TWO  TIMES DAILY 1 each 1  . canagliflozin (INVOKANA) 100 MG TABS tablet Take 100 mg by mouth daily before breakfast.    . chlorpheniramine (CHLOR-TRIMETON) 4 MG tablet Take 1 tablet (4 mg total) by mouth every 4 (four) hours as needed (drainage, drippy nose, hoarseness). 270 tablet 3  . cyclobenzaprine (FLEXERIL) 10 MG tablet TAKE 1 TABLET BY MOUTH TWO  TIMES DAILY AS NEEDED FOR  MUSCLE SPASM 180 tablet 0  . dextromethorphan (DELSYM) 30 MG/5ML liquid 2 tsp twice daily as needed for cough    . DULoxetine (CYMBALTA) 30 MG capsule TAKE 1 CAPSULE BY MOUTH  DAILY 90 capsule 1  . famotidine (PEPCID) 20 MG tablet Take 1 tablet (20 mg total) by mouth at bedtime. 90 tablet 3  . fluticasone (FLONASE) 50 MCG/ACT nasal spray Place 2 sprays into both nostrils daily. 16 g 6  . gabapentin (NEURONTIN) 300 MG capsule Take 1 capsule (300 mg total) by mouth 3 (three) times daily. (Patient taking differently: Take 300 mg by mouth 2 (two) times daily. ) 90 capsule 3  . glimepiride (AMARYL) 4 MG tablet TAKE 1 TABLET BY MOUTH  TWICE A DAY 180 tablet 1  . glucose blood (ONE TOUCH ULTRA TEST) test strip Use to test blood sugar 2 times daily as instructed. Dx code: E11.49 200 each 3  . glucose blood (ONE TOUCH ULTRA TEST) test strip USE TO TEST BLOOD SUGARS  TWO TIMES DAILY 200 each 1  . Insulin Detemir (LEVEMIR FLEXPEN) 100 UNIT/ML Pen Inject 14 Units into the skin at bedtime.  15 mL 3  . insulin lispro (HUMALOG KWIKPEN) 100 UNIT/ML KiwkPen 9 units with breakfast and lunch, 4 units at evening meal    . Insulin Pen Needle 32G X 4 MM MISC Use 3 per day 100 each 3  . latanoprost (XALATAN) 0.005 % ophthalmic solution Place 1 drop into both eyes at bedtime.    Marland Kitchen levocetirizine (XYZAL) 5 MG tablet Take 1 tablet (5 mg total) by mouth every evening. 90 tablet 1  . levothyroxine (SYNTHROID, LEVOTHROID) 25 MCG tablet TAKE 1 TABLET BY MOUTH  DAILY BEFORE BREAKFAST 90 tablet 1  . Melatonin 5 MG TABS Take 1 tablet by mouth at bedtime as  needed.    . metFORMIN (GLUCOPHAGE) 1000 MG tablet TAKE 1 TABLET BY MOUTH TWO  TIMES DAILY WITH MEALS 180 tablet 1  . metoprolol succinate (TOPROL-XL) 100 MG 24 hr tablet TAKE 1 TABLET BY MOUTH  DAILY WITH OR IMMEDIATELY  FOLLOWING A MEAL 90 tablet 1  .  mirtazapine (REMERON) 7.5 MG tablet TAKE 1 TABLET BY MOUTH AT  BEDTIME 90 tablet 1  . montelukast (SINGULAIR) 10 MG tablet TAKE 1 TABLET BY MOUTH AT  BEDTIME 90 tablet 1  . niacin (NIASPAN) 500 MG CR tablet Take 2 tablets (1,000 mg total) by mouth at bedtime. 90 tablet 1  . NONFORMULARY OR COMPOUNDED ITEM Lumbar support belt  #1  As directed 1 each 0  . ONE TOUCH ULTRA TEST test strip TEST TWO TIMES DAILY 200 each 2  . pantoprazole (PROTONIX) 40 MG tablet TAKE 1 TABLET BY MOUTH  DAILY 90 tablet 1  . vitamin E 400 UNIT capsule Take 400 Units by mouth daily.     No current facility-administered medications on file prior to visit.     BP 136/66 (BP Location: Right Arm, Cuff Size: Normal)   Pulse 83   Resp 18   Ht 4' 10.75" (1.492 m)   Wt 138 lb 6.4 oz (62.8 kg)   SpO2 96%   BMI 28.19 kg/m       Objective:   Physical Exam  Physical Exam  Constitutional: She is oriented to person, place, and time. She appears well-developed and well-nourished. No distress.  HENT:  Head: Normocephalic and atraumatic.  Right Ear: Tympanic membrane and ear canal normal.  Left Ear: Tympanic membrane and ear canal normal.  Mouth/Throat: Oropharynx is clear and moist.  Eyes: Pupils are equal, round, and reactive to light. No scleral icterus.  Neck: Normal range of motion. No thyromegaly present.  Cardiovascular: Normal rate and regular rhythm.   No murmur heard. Pulmonary/Chest: Effort normal and breath sounds normal. No respiratory distress. He has no wheezes. She has no rales. She exhibits no tenderness.  Abdominal: Soft. Bowel sounds are normal. She exhibits no distension and no mass. There is no tenderness. There is no rebound and no guarding.    Musculoskeletal: She exhibits no edema.  Lymphadenopathy:    She has no cervical adenopathy.  Neurological: She is alert and oriented to person, place, and time.  She exhibits normal muscle tone. Coordination normal.  Skin: Skin is warm and dry.  Psychiatric: She has a normal mood and affect. Her behavior is normal. Judgment and thought content normal.  Breasts: Examined lying Right: Without masses, retractions, discharge or axillary adenopathy.  Left: Without masses, retractions, discharge or axillary adenopathy.  Pelvic: deferred           Assessment & Plan:  Preventative care- refer for mammogram, discussed healthy diet and exercise.  Due for shingrix at her pharmacy. Due for follow up LFT which will be done today.       Assessment & Plan:

## 2017-12-27 NOTE — Addendum Note (Signed)
Addended by: Harl Bowie on: 12/27/2017 03:00 PM   Modules accepted: Orders

## 2017-12-27 NOTE — Telephone Encounter (Signed)
Pt was seen today with provider and would like to know if possible to mail her last lab results and today's lab results by mail, pt states mentioned it to [place]. Please advise.

## 2017-12-28 ENCOUNTER — Ambulatory Visit (INDEPENDENT_AMBULATORY_CARE_PROVIDER_SITE_OTHER): Payer: Medicare Other | Admitting: Internal Medicine

## 2017-12-28 ENCOUNTER — Encounter: Payer: Self-pay | Admitting: Internal Medicine

## 2017-12-28 ENCOUNTER — Encounter: Payer: Self-pay | Admitting: Family

## 2017-12-28 VITALS — BP 124/70 | HR 86 | Ht 58.75 in | Wt 139.0 lb

## 2017-12-28 DIAGNOSIS — R053 Chronic cough: Secondary | ICD-10-CM

## 2017-12-28 DIAGNOSIS — R05 Cough: Secondary | ICD-10-CM | POA: Diagnosis not present

## 2017-12-28 LAB — HEPATIC FUNCTION PANEL
ALBUMIN: 5 g/dL (ref 3.5–5.2)
ALT: 17 U/L (ref 0–35)
AST: 17 U/L (ref 0–37)
Alkaline Phosphatase: 69 U/L (ref 39–117)
Bilirubin, Direct: 0.2 mg/dL (ref 0.0–0.3)
TOTAL PROTEIN: 7.6 g/dL (ref 6.0–8.3)
Total Bilirubin: 1.1 mg/dL (ref 0.2–1.2)

## 2017-12-28 MED ORDER — TRAMADOL HCL 50 MG PO TABS: 50.0000 mg | ORAL_TABLET | ORAL | 0 refills | Status: DC | PRN

## 2017-12-28 MED ORDER — PREDNISONE 10 MG PO TABS: ORAL_TABLET | ORAL | 0 refills | Status: DC

## 2017-12-28 MED ORDER — ACETAMINOPHEN-CODEINE #3 300-30 MG PO TABS: 1.0000 | ORAL_TABLET | ORAL | 0 refills | Status: AC | PRN

## 2017-12-28 MED ORDER — GABAPENTIN 300 MG PO CAPS: 300.0000 mg | ORAL_CAPSULE | Freq: Four times a day (QID) | ORAL | 3 refills | Status: DC

## 2017-12-28 MED FILL — predniSONE 10 MG TABS: 10 | 6 days supply | Qty: 14 | Fill #0

## 2017-12-28 MED FILL — ACETAMINOPHEN/COD #3 TABLET: 300-30 | 5 days supply | Qty: 30 | Fill #0

## 2017-12-28 NOTE — Patient Instructions (Addendum)
For drainage / throat tickle try take CHLORPHENIRAMINE  4 mg - take one every 4 hours as needed - available over the counter- may cause drowsiness so start with just a bedtime dose or two and see how you tolerate it before trying in daytime    Prednisone 10 mg take  4 each am x 2 days,   2 each am x 2 days,  1 each am x 2 days and stop    Take delsym two tsp every 12 hours and supplement if needed with  Tylenol #3  up to 1 every 4 hours to suppress the urge to cough. Swallowing water and/or using ice chips/non mint and menthol containing candies (such as lifesavers or sugarless jolly ranchers) are also effective.  You should rest your voice and avoid activities that you know make you cough.  Once you have eliminated the cough for 3 straight days try reducing the tylenol #23first,  then the delsym as tolerated.    Increase gabapentin to 300 mg four times daily   Follow the calendar as written    Please schedule a follow up office visit in 2 months , call sooner if needed with all medications /inhalers/ solutions in hand so we can verify exactly what you are taking. This includes all medications from all doctors and over the [place] separate them into two bags:  the ones you take automatically, no matter what, vs the ones you take just when you feel you need them "BAG #2 is UP TO YOU"  - this will really help [place] help you take your medications more effectively.

## 2017-12-28 NOTE — Progress Notes (Addendum)
Subjective:     Patient ID: Mary Flynn, female   DOB: [birth year] MRN: [id number]    Brief patient profile:  83 yo [place] female never smoker  no childhood resp problems including pregancy then around 2004 began a pattern of recurrent cyclical cough  pattern lasting from sev  months up to a year and then gone up to 4-5 months self referred to pulmonary clinic 01/11/2013 for re-eval of recurrent persistent cough - Workup including allergy testing,barium swallow, 24hour pH probe nonrevealing previously     History of Present Illness  01/11/2013 1st [facility] office visit/ Mary Flynn in EMR era with recurrent cough Chief Complaint  Patient presents with  . Acute Visit    Pt c/o increased cough x 2 wks- prod with large amounts of clear sputum.  She states that she coughs until she gags and vomits.   cough tends to be worse with any thing in her mouth and with talking.  Actual mucus production is min and "nothing ever works" to include abx, prednisone, inhalers, gerd rx. eval at [facility] with conclusion "this isn't reflux" Already eval 01/02/13 by Dr Larose Kells rx with zpack with sorethroat that has resolved  >>Stop cozar (losartan), stop calcium carbonate, and the fosfamax, Benicar 20 mg one daily  Protonix 40 mg Take 30- 60 min before your first and last meals of the day  Neurontin (gabapentin) 100 mg take 2 with every meal Chlortrimeton 4 mg at bedtime    01/25/2013 Follow up and med review  Patient returns for a two-week followup and medication review.  We reviewed all her medications organized them and updated MAR (med calendar database was down)  with patient education. Appears that she is taking her medications correctly. Patient reports that her cough is much improved. Patient denies any hemoptysis, chest pain, orthopnea, PND, or leg splint. Last visit. Patient was taken off olmesartan, calcium, and Fosamax. She was started on Benicar 20 mg daily. Treated for reflux, prevention regimen with  Protonix twice daily. Her Neurontin was increased to 200 mg 3 times daily. As you start on Chlor-Trimeton 4 mg at bedtime. Chest x-ray last visit showed a clearing of the left lower lobe pneumonia. rec Protonix 40 mg Take 30- 60 min before your first and last meals of the day  Neurontin (gabapentin) 100 mg take 2 with every meal Chlortrimeton 4 mg every 4hr as needed.  Take delsym two tsp every 12 hours and supplement if needed with  tramadol 50 mg up to 2 every 4 hours as needed to suppress the urge to cough.   GERD diet    02/22/2013 f/u ov/Mary Flynn re: chronic cough/ no med calendar Chief Complaint  Patient presents with  . Follow-up    Pt states her symptoms have improved greatly.  Still some nonprod cough and SOB with exertion, excessive talking.     >>neurontin 300 Three times a day        11/11/2017  Pulmonary  consulation / Mary Flynn recurrent cough since 08/2017 / no med calendar, confused with med names Chief Complaint  Patient presents with  . Pulmonary Consult    Referred by Dr Earlie Counts. Pt c/o increased cough x 2-3 months. She states eating and coughing can trigger the cough.  She is coughing up white sputum. Cough occ wakes her up in the night. She also c/o watery eyes and runny nose.    after flew to [place] summer 2019 > recurrent cough p two weeks into the trip "felt like a  cold but it's gone" Gradually worse since with use of voice Bed Is flat  Taking ppi pc and cough drops  Unless coughing > Not limited by breathing from desired activities  No better on singulair / flonase/xyzal and only taking gabapentin twice daily  rec The key to effective treatment for your cough is eliminating the non-stop cycle of cough you're stuck in long enough to let your airway heal completely and then see if there is anything still making you cough once you stop the cough suppression, but this should take no more than 5 days to figure out First take delsym two tsp every 12 hours and  supplement if needed with  tramadol 50 mg up to 1  every 4 hours to suppress the urge to cough at all or even clear your throat. Swallowing water or using ice chips/non mint and menthol containing candies (such as lifesavers or sugarless jolly ranchers) are also effective.  You should rest your voice and avoid activities that you know make you cough. Once you have eliminated the cough for 3 straight days try reducing the tramadol first,  then the delsym as tolerated.  Increase gabapentin to 300 mg three times a day    12/28/2017 acute extended ov/Mary Flynn re: cough since 08/2017  Chief Complaint  Patient presents with  . Acute Visit    Cough has been worse over the past wk.  She is coughing up white sputum.  She has noticed that talking can trigger the cough.  She has had some choking when she eats.   never really 100% better since 08/2017 with day > noct min prod cough assoc with eating and speaking then abruptly much worse x one week but same overall pattern   No sob unless coughing Sleeping flat ok  No obvious day to day or daytime variability or assoc excess/ purulent sputum or mucus plugs or hemoptysis or cp or chest tightness, subjective wheeze or overt sinus or hb symptoms.   Sleeping without nocturnal  or early am exacerbation  of respiratory  c/o's or need for noct saba. Also denies any obvious fluctuation of symptoms with weather or environmental changes or other aggravating or alleviating factors except as outlined above   No unusual exposure hx or h/o childhood pna/ asthma or knowledge of premature birth.  Current Allergies, Complete Past Medical History, Past Surgical History, Family History, and Social History were reviewed in [employer] record.  ROS  The following are not active complaints unless bolded Hoarseness, sore throat, dysphagia, dental problems, itching, sneezing despite xyzal qhs ,  nasal congestion or discharge of excess mucus or purulent secretions,  ear ache,   fever, chills, sweats, unintended wt loss or wt gain, classically pleuritic or exertional cp,  orthopnea pnd or arm/hand swelling  or leg swelling, presyncope, palpitations, abdominal pain, anorexia, nausea, vomiting, diarrhea  or change in bowel habits or change in bladder habits, change in stools or change in urine, dysuria, hematuria,  rash, arthralgias, visual complaints, headache, numbness, weakness or ataxia or problems with walking or coordination,  change in mood or  memory.        Current Meds  Medication Sig  . amLODipine (NORVASC) 10 MG tablet Take 1 tablet (10 mg total) by mouth daily.  Marland Kitchen aspirin EC 81 MG tablet Take 81 mg by mouth daily.  Marland Kitchen atorvastatin (LIPITOR) 10 MG tablet TAKE 1 TABLET BY MOUTH  EVERY DAY  . Blood Glucose Monitoring Suppl (ONE TOUCH ULTRA 2) w/Device  KIT USE AS DIRECTED TO TEST  BLOOD SUGAR TWO TIMES DAILY  . canagliflozin (INVOKANA) 100 MG TABS tablet Take 100 mg by mouth daily before breakfast.  . chlorpheniramine (CHLOR-TRIMETON) 4 MG tablet Take 1 tablet (4 mg total) by mouth every 4 (four) hours as needed (drainage, drippy nose, hoarseness).  . cyclobenzaprine (FLEXERIL) 10 MG tablet TAKE 1 TABLET BY MOUTH TWO  TIMES DAILY AS NEEDED FOR  MUSCLE SPASM  . dextromethorphan (DELSYM) 30 MG/5ML liquid 2 tsp twice daily as needed for cough  . DULoxetine (CYMBALTA) 30 MG capsule TAKE 1 CAPSULE BY MOUTH  DAILY  . famotidine (PEPCID) 20 MG tablet Take 1 tablet (20 mg total) by mouth at bedtime.  . fluticasone (FLONASE) 50 MCG/ACT nasal spray Place 2 sprays into both nostrils daily.  Marland Kitchen gabapentin (NEURONTIN) 300 MG capsule Take 1 capsule (300 mg total) by mouth 4 (four) times daily.  Marland Kitchen glimepiride (AMARYL) 4 MG tablet TAKE 1 TABLET BY MOUTH  TWICE A DAY  . glucose blood (ONE TOUCH ULTRA TEST) test strip Use to test blood sugar 2 times daily as instructed. Dx code: E11.49  . glucose blood (ONE TOUCH ULTRA TEST) test strip USE TO TEST BLOOD SUGARS  TWO TIMES  DAILY  . Insulin Detemir (LEVEMIR FLEXPEN) 100 UNIT/ML Pen Inject 14 Units into the skin at bedtime.   . insulin lispro (HUMALOG KWIKPEN) 100 UNIT/ML KiwkPen 9 units with breakfast and lunch, 4 units at evening meal  . Insulin Pen Needle 32G X 4 MM MISC Use 3 per day  . latanoprost (XALATAN) 0.005 % ophthalmic solution Place 1 drop into both eyes at bedtime.  Marland Kitchen levocetirizine (XYZAL) 5 MG tablet Take 1 tablet (5 mg total) by mouth every evening.  Marland Kitchen levothyroxine (SYNTHROID, LEVOTHROID) 25 MCG tablet TAKE 1 TABLET BY MOUTH  DAILY BEFORE BREAKFAST  . Melatonin 5 MG TABS Take 1 tablet by mouth at bedtime as needed.  . metFORMIN (GLUCOPHAGE) 1000 MG tablet TAKE 1 TABLET BY MOUTH TWO  TIMES DAILY WITH MEALS  . metoprolol succinate (TOPROL-XL) 100 MG 24 hr tablet TAKE 1 TABLET BY MOUTH  DAILY WITH OR IMMEDIATELY  FOLLOWING A MEAL  . mirtazapine (REMERON) 7.5 MG tablet TAKE 1 TABLET BY MOUTH AT  BEDTIME  . montelukast (SINGULAIR) 10 MG tablet TAKE 1 TABLET BY MOUTH AT  BEDTIME  . niacin (NIASPAN) 500 MG CR tablet Take 2 tablets (1,000 mg total) by mouth at bedtime.  . NONFORMULARY OR COMPOUNDED ITEM Lumbar support belt  #1  As directed  . nystatin ointment (MYCOSTATIN) Apply 1 application topically 2 (two) times daily as needed.  . ONE TOUCH ULTRA TEST test strip TEST TWO TIMES DAILY  . pantoprazole (PROTONIX) 40 MG tablet TAKE 1 TABLET BY MOUTH  DAILY  . vitamin E 400 UNIT capsule Take 400 Units by mouth daily.  Marland Kitchen Zoster Vaccine Adjuvanted [facility]) injection Inject 0.60m IM now and again in 2-6 months.  . [DISCONTINUED] gabapentin (NEURONTIN) 300 MG capsule Take 1 capsule (300 mg total) by mouth 3 (three) times daily. (Patient taking differently: Take 300 mg by mouth 2 (two) times daily. )             Objective:   Physical Exam  amb IPanamafemale quite verbose s spont cough but when she does start it appears she's almost gagging/ choking   05/03/2013      136 > 11/11/2017   > 11/11/2017      138 > e 12/28/2017  139     04/14/13 136 lb (61.689 kg)  03/28/13 137 lb (62.143 kg)  03/24/13 138 lb (62.596 kg)      Vital signs reviewed - Note on arrival 02 sats  94% on RA      HEENT: nl dentition, turbinates bilaterally, and oropharynx. Nl external ear canals without cough reflex   NECK :  without JVD/Nodes/TM/ nl carotid upstrokes bilaterally   LUNGS: no acc muscle use,  Nl contour chest which is clear to A and P bilaterally without cough on insp or exp maneuvers   CV:  RRR  no s3 or murmur or increase in P2, and no edema   ABD:  soft and nontender with nl inspiratory excursion in the supine position. No bruits or organomegaly appreciated, bowel sounds nl  MS:  Nl gait/ ext warm without deformities, calf tenderness, cyanosis or clubbing No obvious joint restrictions   SKIN: warm and dry without lesions    NEURO:  alert, approp, nl sensorium with  no motor or cerebellar deficits apparent.         I personally reviewed images and agree with radiology impression as follows:  CXR:   10/29/17 Bronchitic changes with scarring in both lungs. No acute abnormalities.     Assessment:

## 2017-12-29 ENCOUNTER — Encounter: Payer: Self-pay | Admitting: Internal Medicine

## 2017-12-29 NOTE — Assessment & Plan Note (Addendum)
- GI w/u 2004:  Mod BS nl, nl 24 h pH off all GI meds > rec d/c GERD rx  Per Dr Henrene Pastor  -PFT 2012 normal -no airflow obstruction, no restriction, moderately reduced diffusing capacity - increased neurontin to 200 tid 01/11/2013 > 300 mg tid as of 02/22/2013  -med calendar 03/10/13 and confirmed adherence -CT sinus 04/05/13 >Chronic sphenoid sinusitis. Slight nasal septal deviation right to left. -CT chest 04/05/13 >Patchy areas of paraseptal and central lobar emphysema. . Patchy ground-glass opacities suggest a partial airspace filling process and could reflect mild edema or inflammation. . Linear scarring changes in both lungs. - 08/23/2013 reporting 100% resolution on neurontin 300 tid rec taper off x one month - 11/11/2017 re-eval for cough with  flare 08/2017 since uri ? Related to air travel> rec cyclical cough regimen and increase gabapentin back to 300 tid.    The pattern of cough mostly with swallowing and speaking and not reproduced on inspiratory or expiratory maneuvers today with perfectly clear lungs = classic for upper airway cough syndrome.  Upper airway cough syndrome (previously labeled PNDS),  is so named because it's frequently impossible to sort out how much is  CR/sinusitis with freq throat clearing (which can be related to primary GERD)   vs  causing  secondary (" extra esophageal")  GERD from wide swings in gastric pressure that occur with throat clearing, often  promoting self use of mint and menthol lozenges that reduce the lower esophageal sphincter tone and exacerbate the problem further in a cyclical fashion.   These are the same pts (now being labeled as having "irritable larynx syndrome" by some cough centers) who not infrequently have a history of having failed to tolerate ace inhibitors,  dry powder inhalers or biphosphonates or report having atypical/extraesophageal reflux symptoms that don't respond to standard doses of PPI  and are easily confused as having aecopd or asthma  flares by even experienced allergists/ pulmonologists (myself included).   Of the three most common causes of  Sub-acute / recurrent or chronic cough, only one (GERD)  can actually contribute to/ trigger  the other two (asthma and post nasal drip syndrome)  and perpetuate the cylce of cough.  While not intuitively obvious, many patients with chronic low grade reflux do not cough until there is a primary insult that disturbs the protective epithelial barrier and exposes sensitive nerve endings.   This is typically viral but can due to PNDS and  either may apply here.     >>>   The point is that once this occurs, it is difficult to eliminate the cycle  using anything but a maximally effective acid suppression regimen at least in the short run, accompanied by an appropriate diet to address non acid GERD and control / eliminate the cough itself for at least 3 days with tylenol #3 and eliminate pnds with 1st gen H1 blockers per guidelines/ any upper airway inflammation with 6 days of prednisone  >>>.  Will also increase gabapentin to 300 mg qid and if not improving refer to [facility]/ dr Carol Ada.   I had an extended discussion with the patient/husband  reviewing all relevant studies completed to date and  lasting 15 to 20 minutes of a 25 minute acute office  visit    Each maintenance medication was reviewed in detail including most importantly the difference between maintenance and prns and under what circumstances the prns are to be triggered using an action plan format that is not reflected  in the computer generated alphabetically organized AVS but trather by a customized med calendar that reflects the AVS meds with confirmed 100% correlation.   In addition, Please see AVS for unique instructions that I personally wrote and verbalized to the the pt in detail and then reviewed with pt  by my nurse highlighting any  changes in therapy recommended at today's visit to their plan of care.

## 2017-12-29 NOTE — Telephone Encounter (Signed)
Results mailed 

## 2017-12-31 ENCOUNTER — Telehealth: Payer: Self-pay | Admitting: Internal Medicine

## 2017-12-31 NOTE — Telephone Encounter (Signed)
Called patient, unable to reach and unable to leave voicemail.

## 2018-01-03 ENCOUNTER — Ambulatory Visit (HOSPITAL_BASED_OUTPATIENT_CLINIC_OR_DEPARTMENT_OTHER): Payer: Self-pay

## 2018-01-03 ENCOUNTER — Encounter (HOSPITAL_BASED_OUTPATIENT_CLINIC_OR_DEPARTMENT_OTHER): Payer: Self-pay

## 2018-01-03 ENCOUNTER — Ambulatory Visit (HOSPITAL_BASED_OUTPATIENT_CLINIC_OR_DEPARTMENT_OTHER)
Admission: RE | Admit: 2018-01-03 | Discharge: 2018-01-03 | Disposition: A | Discharge status: Home / Self Care | Payer: Medicare Other | Source: Ambulatory Visit | Attending: Family | Admitting: Family

## 2018-01-03 DIAGNOSIS — Z1231 Encounter for screening mammogram for malignant neoplasm of breast: Secondary | ICD-10-CM | POA: Insufficient documentation

## 2018-01-03 DIAGNOSIS — Z1239 Encounter for other screening for malignant neoplasm of breast: Secondary | ICD-10-CM

## 2018-01-04 NOTE — Telephone Encounter (Signed)
Called and spoke with pt to see if she needed the chlorpheniramine sent to a different pharmacy instead of [facility] and pt stated she got the med OTC and did not need an Rx.  Nothing further needed.

## 2018-01-05 ENCOUNTER — Other Ambulatory Visit: Payer: Self-pay

## 2018-01-14 ENCOUNTER — Other Ambulatory Visit: Payer: Self-pay | Admitting: Family

## 2018-02-14 ENCOUNTER — Encounter: Payer: Self-pay | Admitting: Gastroenterology

## 2018-02-28 ENCOUNTER — Other Ambulatory Visit: Payer: Self-pay | Admitting: Family

## 2018-02-28 DIAGNOSIS — E785 Hyperlipidemia, unspecified: Secondary | ICD-10-CM

## 2018-02-28 DIAGNOSIS — K219 Gastro-esophageal reflux disease without esophagitis: Secondary | ICD-10-CM

## 2018-02-28 DIAGNOSIS — E1140 Type 2 diabetes mellitus with diabetic neuropathy, unspecified: Secondary | ICD-10-CM

## 2018-02-28 DIAGNOSIS — I10 Essential (primary) hypertension: Secondary | ICD-10-CM

## 2018-03-09 ENCOUNTER — Ambulatory Visit: Payer: Self-pay | Admitting: Internal Medicine

## 2018-03-17 ENCOUNTER — Other Ambulatory Visit: Payer: Self-pay

## 2018-04-12 ENCOUNTER — Encounter: Payer: Self-pay | Admitting: Internal Medicine

## 2018-04-20 ENCOUNTER — Ambulatory Visit: Payer: Medicare Other | Admitting: Internal Medicine

## 2018-04-20 ENCOUNTER — Encounter: Payer: Self-pay | Admitting: Internal Medicine

## 2018-04-20 DIAGNOSIS — R05 Cough: Secondary | ICD-10-CM

## 2018-04-20 DIAGNOSIS — R053 Chronic cough: Secondary | ICD-10-CM

## 2018-04-20 MED ORDER — FAMOTIDINE 20 MG PO TABS: 20.0000 mg | ORAL_TABLET | Freq: Every day | ORAL | 3 refills | Status: DC

## 2018-04-20 MED ORDER — ACETAMINOPHEN-CODEINE #3 300-30 MG PO TABS: 1.0000 | ORAL_TABLET | ORAL | 0 refills | Status: AC | PRN

## 2018-04-20 MED ORDER — PREDNISONE 10 MG PO TABS: ORAL_TABLET | ORAL | 0 refills | Status: DC

## 2018-04-20 MED FILL — predniSONE 10 MG TABS: 10 | 6 days supply | Qty: 14 | Fill #0

## 2018-04-20 MED FILL — ACETAMINOPHEN/COD #3 TABLET: 300-30 | 5 days supply | Qty: 30 | Fill #0

## 2018-04-20 NOTE — Patient Instructions (Addendum)
For drainage / throat tickle try take CHLORPHENIRAMINE  4 mg (chlortabs 4 mg at [facility])  - take one every 4 hours as needed - available over the counter- may cause drowsiness so start with just a bedtime dose or two and see how you tolerate it before trying in daytime    Prednisone 10 mg take  4 each am x 2 days,   2 each am x 2 days,  1 each am x 2 days and stop    Take delsym two tsp every 12 hours and supplement if needed with  Tylenol #3  up to 1 every 4 hours to suppress the urge to cough. Swallowing water and/or using ice chips/non mint and menthol containing candies (such as lifesavers or sugarless jolly ranchers) are also effective.  You should rest your voice and avoid activities that you know make you cough.  Once you have eliminated the cough for 3 straight days try reducing the tylenol #3 first,  then the delsym as tolerated.    Increase gabapentin to 300 mg four times daily     See Tammy NP in  2 weeks with all your medications, even over the counter meds, separated in two separate bags, the ones you take no matter what vs the ones you stop once you feel better and take only as needed when you feel you need them.   Tammy  will generate for you a new user friendly medication calendar that will put [place] all on the same page re: your medication use.     Without this process, it simply isn't possible to assure that we are providing  your outpatient care  with  the attention to detail we feel you deserve.   If we cannot assure that you're getting that kind of care,  then we cannot manage your problem effectively from this clinic.  Once you have seen Tammy and we are sure that we're all on the same page with your medication use she will arrange follow up with me.

## 2018-04-20 NOTE — Progress Notes (Signed)
Subjective:     Patient ID: Mary Flynn, female   DOB: Jul 09, 1934 MRN: [id number]    Brief patient profile:  84 yo [place] female never smoker  no childhood resp problems including pregancy then around 2004 began a pattern of recurrent cyclical cough  pattern lasting from sev  months up to a year and then gone up to 4-5 months self referred to pulmonary clinic 01/11/2013 for re-eval of recurrent persistent cough - Workup including allergy testing,barium swallow, 24hour pH probe nonrevealing previously     History of Present Illness  01/11/2013 1st [facility] office visit/ Mary Flynn in EMR era with recurrent cough Chief Complaint  Patient presents with  . Acute Visit    Pt c/o increased cough x 2 wks- prod with large amounts of clear sputum.  She states that she coughs until she gags and vomits.   cough tends to be worse with any thing in her mouth and with talking.  Actual mucus production is min and "nothing ever works" to include abx, prednisone, inhalers, gerd rx. eval at [facility] with conclusion "this isn't reflux" Already eval 01/02/13 by Mary Flynn rx with zpack with sorethroat that has resolved  >>Stop cozar (losartan), stop calcium carbonate, and the fosfamax, Benicar 20 mg one daily  Protonix 40 mg Take 30- 60 min before your first and last meals of the day  Neurontin (gabapentin) 100 mg take 2 with every meal Chlortrimeton 4 mg at bedtime    01/25/2013 Follow up and med review  Patient returns for a two-week followup and medication review.  We reviewed all her medications organized them and updated MAR (med calendar database was down)  with patient education. Appears that she is taking her medications correctly. Patient reports that her cough is much improved. Patient denies any hemoptysis, chest pain, orthopnea, PND, or leg splint. Last visit. Patient was taken off olmesartan, calcium, and Fosamax. She was started on Benicar 20 mg daily. Treated for reflux, prevention regimen with  Protonix twice daily. Her Neurontin was increased to 200 mg 3 times daily. As you start on Chlor-Trimeton 4 mg at bedtime. Chest x-ray last visit showed a clearing of the left lower lobe pneumonia. rec Protonix 40 mg Take 30- 60 min before your first and last meals of the day  Neurontin (gabapentin) 100 mg take 2 with every meal Chlortrimeton 4 mg every 4hr as needed.  Take delsym two tsp every 12 hours and supplement if needed with  tramadol 50 mg up to 2 every 4 hours as needed to suppress the urge to cough.   GERD diet    02/22/2013 f/u ov/Mary Flynn re: chronic cough/ no med calendar Chief Complaint  Patient presents with  . Follow-up    Pt states her symptoms have improved greatly.  Still some nonprod cough and SOB with exertion, excessive talking.     >>neurontin 300 Three times a day        11/11/2017  Pulmonary  consulation / Mary Flynn recurrent cough since 08/2017 / no med calendar, confused with med names Chief Complaint  Patient presents with  . Pulmonary Consult    Referred by Mary Mary Flynn. Pt c/o increased cough x 2-3 months. She states eating and coughing can trigger the cough.  She is coughing up white sputum. Cough occ wakes her up in the night. She also c/o watery eyes and runny nose.    after flew to [place] summer 2019 > recurrent cough p two weeks into the trip "felt like a  cold but it's gone" Gradually worse since with use of voice Bed Is flat  Taking ppi pc and cough drops  Unless coughing > Not limited by breathing from desired activities  No better on singulair / flonase/xyzal and only taking gabapentin twice daily  rec The key to effective treatment for your cough is eliminating the non-stop cycle of cough you're stuck in long enough to let your airway heal completely and then see if there is anything still making you cough once you stop the cough suppression, but this should take no more than 5 days to figure out First take delsym two tsp every 12 hours and  supplement if needed with  tramadol 50 mg up to 1  every 4 hours to suppress the urge to cough at all or even clear your throat. Swallowing water or using ice chips/non mint and menthol containing candies (such as lifesavers or sugarless jolly ranchers) are also effective.  You should rest your voice and avoid activities that you know make you cough. Once you have eliminated the cough for 3 straight days try reducing the tramadol first,  then the delsym as tolerated.  Increase gabapentin to 300 mg three times a day    12/28/2017 acute extended ov/Mary Flynn re: cough since 08/2017  Chief Complaint  Patient presents with  . Acute Visit    Cough has been worse over the past wk.  She is coughing up white sputum.  She has noticed that talking can trigger the cough.  She has had some choking when she eats.   never really 100% better since 08/2017 with day > noct min prod cough assoc with eating and speaking then abruptly much worse x one week but same overall pattern   No sob unless coughing Sleeping flat ok rec For drainage / throat tickle try take CHLORPHENIRAMINE  4 mg - take one every 4 hours as needed - available over the counter- may cause drowsiness so start with just a bedtime dose or two a  Prednisone 10 mg take  4 each am x 2 days,   2 each am x 2 days,  1 each am x 2 days and stop  Take delsym two tsp every 12 hours and supplement if needed with  Tylenol #3  up to 1 every 4 hours Increase gabapentin to 300 mg four times daily  Follow the calendar as written  Please schedule a follow up office visit in 2 months , call sooner if needed with all medications /inhalers/ solutions in hand    04/20/2018 extended  ov/Mary Flynn re:  uacs  - 75% better p last ov and sustained for about a month - no meds or med calendar in hand Chief Complaint  Patient presents with  . Acute Visit    Pt c/o increased cough with white sputum for the past 2-3 months. Talking can trigger her to cough.   Dyspnea:  Ok unless  coughing Cough: min productive day >> noct, talking makes it worse, sweets makes it worse eg orange juice  Sleeping: less likely cough while sleeping / does not raise hob  SABA use: no better with variable inhalers from [place]  02: none    No obvious day to day or daytime variability or assoc excess/ purulent sputum or mucus plugs or hemoptysis or cp or chest tightness, subjective wheeze or overt sinus or hb symptoms.    Also denies any obvious fluctuation of symptoms with weather or environmental changes or other aggravating or alleviating factors  except as outlined above   No unusual exposure hx or h/o childhood pna/ asthma or knowledge of premature birth.  Current Allergies, Complete Past Medical History, Past Surgical History, Family History, and Social History were reviewed in [employer] record.  ROS  The following are not active complaints unless bolded Hoarseness, sore throat, dysphagia with globus sensation, dental problems, itching, sneezing,  nasal congestion or discharge of excess mucus or purulent secretions, ear ache,   fever, chills, sweats, unintended wt loss or wt gain, classically pleuritic or exertional cp,  orthopnea pnd or arm/hand swelling  or leg swelling, presyncope, palpitations, abdominal pain, anorexia, nausea, vomiting, diarrhea  or change in bowel habits or change in bladder habits, change in stools or change in urine, dysuria, hematuria,  rash, arthralgias, visual complaints, headache, numbness, weakness or ataxia or problems with walking or coordination,  change in mood or  memory.        Current Meds - - NOTE:   Unable to verify as accurately reflecting what pt takes     Medication Sig  . amLODipine (NORVASC) 10 MG tablet Take 1 tablet (10 mg total) by mouth daily.  Marland Kitchen aspirin EC 81 MG tablet Take 81 mg by mouth daily.  Marland Kitchen atorvastatin (LIPITOR) 10 MG tablet TAKE 1 TABLET BY MOUTH  EVERY DAY  . Blood Glucose Monitoring Suppl (ONE TOUCH  ULTRA 2) w/Device KIT USE AS DIRECTED TO TEST  BLOOD SUGAR TWO TIMES DAILY  . canagliflozin (INVOKANA) 100 MG TABS tablet Take 100 mg by mouth daily before breakfast.  . cyclobenzaprine (FLEXERIL) 10 MG tablet TAKE 1 TABLET BY MOUTH TWO  TIMES DAILY AS NEEDED FOR  MUSCLE SPASM  . dextromethorphan (DELSYM) 30 MG/5ML liquid 2 tsp twice daily as needed for cough  . DULoxetine (CYMBALTA) 30 MG capsule TAKE 1 CAPSULE BY MOUTH  DAILY  . famotidine (PEPCID) 20 MG tablet Take 1 tablet (20 mg total) by mouth at bedtime.  . fluconazole (DIFLUCAN) 150 MG tablet TAKE 1 TABLET BY MOUTH ONCE AS NEEDED FOR VAGINAL  INFECTION.  . fluticasone (FLONASE) 50 MCG/ACT nasal spray Place 2 sprays into both nostrils daily.  Marland Kitchen gabapentin (NEURONTIN) 300 MG capsule Take 1 capsule (300 mg total) by mouth 4 (four) times daily.  Marland Kitchen glimepiride (AMARYL) 4 MG tablet TAKE 1 TABLET BY MOUTH  TWICE A DAY  . glucose blood (ONE TOUCH ULTRA TEST) test strip Use to test blood sugar 2 times daily as instructed. Dx code: E11.49  . glucose blood (ONE TOUCH ULTRA TEST) test strip USE TO TEST BLOOD SUGARS  TWO TIMES DAILY  . Insulin Detemir (LEVEMIR FLEXPEN) 100 UNIT/ML Pen Inject 14 Units into the skin at bedtime.   . insulin lispro (HUMALOG KWIKPEN) 100 UNIT/ML KiwkPen 9 units with breakfast and lunch, 4 units at evening meal  . Insulin Pen Needle 32G X 4 MM MISC Use 3 per day  . latanoprost (XALATAN) 0.005 % ophthalmic solution Place 1 drop into both eyes at bedtime.  Marland Kitchen levocetirizine (XYZAL) 5 MG tablet TAKE 1 TABLET BY MOUTH  EVERY EVENING  . levothyroxine (SYNTHROID, LEVOTHROID) 25 MCG tablet TAKE 1 TABLET BY MOUTH  DAILY BEFORE BREAKFAST  . Melatonin 5 MG TABS Take 1 tablet by mouth at bedtime as needed.  . metFORMIN (GLUCOPHAGE) 1000 MG tablet TAKE 1 TABLET BY MOUTH TWO  TIMES DAILY WITH MEALS  . metoprolol succinate (TOPROL-XL) 100 MG 24 hr tablet TAKE 1 TABLET BY MOUTH  DAILY WITH OR IMMEDIATELY  FOLLOWING A MEAL  . mirtazapine  (REMERON) 7.5 MG tablet TAKE 1 TABLET BY MOUTH AT  BEDTIME  . montelukast (SINGULAIR) 10 MG tablet TAKE 1 TABLET BY MOUTH AT  BEDTIME  . niacin (NIASPAN) 500 MG CR tablet Take 2 tablets (1,000 mg total) by mouth at bedtime.  . NONFORMULARY OR COMPOUNDED ITEM Lumbar support belt  #1  As directed  . nystatin ointment (MYCOSTATIN) Apply 1 application topically 2 (two) times daily as needed.  . ONE TOUCH ULTRA TEST test strip TEST TWO TIMES DAILY  . pantoprazole (PROTONIX) 40 MG tablet TAKE 1 TABLET BY MOUTH  DAILY  . vitamin E 400 UNIT capsule Take 400 Units by mouth daily.                   Objective:   Physical Exam  amb [place] female nad with min cough during interview, exam. Very talkative  05/03/2013      136 > 11/11/2017   > 11/11/2017     138 >  12/28/2017   139 > 04/20/2018   141     04/14/13 136 lb (61.689 kg)  03/28/13 137 lb (62.143 kg)  03/24/13 138 lb (62.596 kg)       Vital signs reviewed - Note on arrival 02 sats  98% on RA   HEENT: nl dentition, turbinates bilaterally, and oropharynx. Nl external ear canals without cough reflex   NECK :  without JVD/Nodes/TM/ nl carotid upstrokes bilaterally   LUNGS: no acc muscle use,  Nl contour chest which is clear to A and P bilaterally without cough on insp or exp maneuvers   CV:  RRR  no s3 or murmur or increase in P2, and no edema   ABD:  soft and nontender with nl inspiratory excursion in the supine position. No bruits or organomegaly appreciated, bowel sounds nl  MS:  Nl gait/ ext warm without deformities, calf tenderness, cyanosis or clubbing No obvious joint restrictions   SKIN: warm and dry without lesions    NEURO:  alert, approp, nl sensorium with  no motor or cerebellar deficits apparent.           Assessment:

## 2018-04-21 ENCOUNTER — Encounter: Payer: Self-pay | Admitting: Internal Medicine

## 2018-04-21 NOTE — Assessment & Plan Note (Signed)
Onset 2004  - GI w/u 2004:  Mod BS nl, nl 24 h pH off all GI meds > rec d/c GERD rx  Per Dr Henrene Pastor  -PFT 2012 normal -no airflow obstruction, no restriction, moderately reduced diffusing capacity - increased neurontin to 200 tid 01/11/2013 > 300 mg tid as of 02/22/2013  -med calendar 03/10/13 and confirmed adherence -CT sinus 04/05/13 >Chronic sphenoid sinusitis. Slight nasal septal deviation right to left. -CT chest 04/05/13 >Patchy areas of paraseptal and central lobar emphysema. . Patchy ground-glass opacities suggest a partial airspace filling process and could reflect mild edema or inflammation. . Linear scarring changes in both lungs. - 08/23/2013 reporting 100% resolution on neurontin 300 tid rec taper off x one month - 11/11/2017 re-eval for cough with  flare 08/2017 since uri ? Related to air travel> rec cyclical cough regimen and increase gabapentin back to 300 tid.  - 12/28/2017 increased gabapentin to 300 mg qid and refer to Dr Eliott [facility] = next step > did not go  - 04/20/2018 relapsed cough on ?  rx > rec resume gabapentin 300 mg qid and 1st gen H1 blockers per guidelines if tol then return for med reconciliation    Lack of cough resolution on a verified empirical regimen which I have not been consistently able to achieve  could mean an alternative diagnosis, persistence of the disease state (eg sinusitis or bronchiectasis) , or inadequacy of currently available therapy (eg no medical rx available for non-acid gerd)   >>> always a concern in chronic coughers where cough induces reflux induces cough even if acid is adequately suppressed as may be the case here.     The standardized cough guidelines published in Chest by Lissa Morales in 2006 are still the best available and consist of a multiple step process (up to 12!) , not a single office visit,  and are intended  to address this problem logically,  with an alogrithm dependent on response to empiric treatment at  each progressive  step  to determine a specific diagnosis with  minimal addtional testing needed. Therefore if adherence is an issue or can't be accurately verified,  it's very unlikely the standard evaluation and treatment will be successful here.    Furthermore, response to therapy (other than acute cough suppression, which should only be used short term with avoidance of narcotic containing cough syrups if possible), can be a gradual process for which the patient is not likely to  perceive immediate benefit.  Unlike going to an eye doctor where the best perscription is almost always the first one and is immediately effective, this is almost never the case in the management of chronic cough syndromes. Therefore the patient needs to commit up front to consistently adhere to recommendations  for up to 6 weeks of therapy directed at the likely underlying problem(s) before the response can be reasonably evaluated.   She is a very bright lady but is very disorganized with med rx and getting advice from multiple providers and fm members (some of whom are apparently md's ) and unless willing to let me take ownership of this problem or refer to Dr Joya Gaskins at [place] I don't believe she will be able to achieve longterm control   Of the three most common causes of  Sub-acute / recurrent or chronic cough, only one (GERD)  can actually contribute to/ trigger  the other two (asthma and post nasal drip syndrome)  and perpetuate the cylce of cough.  While  not intuitively obvious, many patients with chronic low grade reflux do not cough until there is a primary insult that disturbs the protective epithelial barrier and exposes sensitive nerve endings.   This is typically viral but can due to PNDS and  either may apply here.   The point is that once this occurs, it is difficult to eliminate the cycle  using anything but a maximally effective acid suppression regimen at least in the short run, accompanied by an appropriate diet to address  non acid GERD and control / eliminate the cough itself for at least 3 days with codeine and longterm with gabapentin @ 300 mg qid and eliminate pnds with 1st gen H1 blockers per guidelines      Plan is to return with all meds in hand using a trust but verify approach to confirm accurate Medication  Reconciliation The principal here is that until we are certain that the  patients are doing what we've asked, it makes no sense to ask them to do more.    > 50 % of this 40 min acute ov spent on counseling    Each maintenance medication was reviewed in detail including most importantly the difference between maintenance and as needed (vs short term use only like prednisone) and under what circumstances the prns are to be used.  Please see AVS for specific  Instructions which are unique to this visit and I personally typed out  which were reviewed in detail in writing with the patient and a copy provided.

## 2018-04-26 ENCOUNTER — Telehealth: Payer: Self-pay | Admitting: Internal Medicine

## 2018-04-26 MED ORDER — GABAPENTIN 300 MG PO CAPS: 300.0000 mg | ORAL_CAPSULE | Freq: Four times a day (QID) | ORAL | 1 refills | Status: DC

## 2018-04-26 NOTE — Telephone Encounter (Signed)
Patient last seen by Dr Melvyn Novas on 2.26.2020: Patient Instructions  For drainage / throat tickle try take CHLORPHENIRAMINE  4 mg (chlortabs 4 mg at [facility])  - take one every 4 hours as needed - available over the counter- may cause drowsiness so start with just a bedtime dose or two and see how you tolerate it before trying in daytime     Prednisone 10 mg take  4 each am x 2 days,   2 each am x 2 days,  1 each am x 2 days and stop      Take delsym two tsp every 12 hours and supplement if needed with  Tylenol #3  up to 1 every 4 hours to suppress the urge to cough. Swallowing water and/or using ice chips/non mint and menthol containing candies (such as lifesavers or sugarless jolly ranchers) are also effective.  You should rest your voice and avoid activities that you know make you cough.   Once you have eliminated the cough for 3 straight days try reducing the tylenol #3 first,  then the delsym as tolerated.     Increase gabapentin to 300 mg four times daily      See Tammy NP in  2 weeks with all your medications, even over the counter meds, separated in two separate bags, the ones you take no matter what vs the ones you stop once you feel better and take only as needed when you feel you need them.   Tammy  will generate for you a new user friendly medication calendar that will put [place] all on the same page re: your medication use.       Without this process, it simply isn't possible to assure that we are providing  your outpatient care  with  the attention to detail we feel you deserve.   If we cannot assure that you're getting that kind of care,  then we cannot manage your problem effectively from this clinic.   Once you have seen Tammy and we are sure that we're all on the same page with your medication use she will arrange follow up with me.    Called spoke with patient, verified request for Gabapentin Rx to be sent to [facility] This has been done Nothing further needed; will sign off

## 2018-05-02 MED ORDER — GABAPENTIN 300 MG PO CAPS: 300.0000 mg | ORAL_CAPSULE | Freq: Three times a day (TID) | ORAL | 1 refills | Status: DC

## 2018-05-02 MED ORDER — FLUTICASONE PROPIONATE 50 MCG/ACT NA SUSP: 2.0000 | Freq: Every day | NASAL | 6 refills | Status: DC

## 2018-05-06 ENCOUNTER — Encounter: Payer: Self-pay | Admitting: Adult Health

## 2018-05-06 ENCOUNTER — Other Ambulatory Visit: Payer: Self-pay

## 2018-05-06 ENCOUNTER — Other Ambulatory Visit: Payer: Self-pay | Admitting: Adult Health

## 2018-05-06 ENCOUNTER — Ambulatory Visit (INDEPENDENT_AMBULATORY_CARE_PROVIDER_SITE_OTHER): Payer: Medicare Other | Admitting: Adult Health

## 2018-05-06 DIAGNOSIS — K219 Gastro-esophageal reflux disease without esophagitis: Secondary | ICD-10-CM

## 2018-05-06 DIAGNOSIS — R053 Chronic cough: Secondary | ICD-10-CM

## 2018-05-06 DIAGNOSIS — R05 Cough: Secondary | ICD-10-CM | POA: Diagnosis not present

## 2018-05-06 DIAGNOSIS — J309 Allergic rhinitis, unspecified: Secondary | ICD-10-CM

## 2018-05-06 DIAGNOSIS — R059 Cough, unspecified: Secondary | ICD-10-CM

## 2018-05-06 NOTE — Assessment & Plan Note (Addendum)
Chronic cough refractory to aggressive cough regimen  Previous CT chest 2015 and 2017 stable scarring /inflammation  Will check HRCT chest - r/o ILD .  Previous CT sinus chronic sphenoid sinusitis .  Continue trigger control  Check PFT on return  Patient's medications were reviewed today and patient education was given. Computerized medication calendar was adjusted/completed  If not better , check allergy profile w/ IgE .  If able to check sputum culture , check AFB , Fungal and sputum cx .    Plan  Patient Instructions  Continue on current regimen  Follow medication calendar closely .  Set up for HRCT chest .  Follow up with Dr. Melvyn Novas  In 3 months with PFT  and As needed

## 2018-05-06 NOTE — Progress Notes (Signed)
$'@Patient'I$  ID: Mary Flynn, female    DOB: [birth year], 84 y.o.   MRN: [id number]  Chief Complaint  Patient presents with  . Follow-up    Cough     Referring provider: [name], NP  HPI: 84 year old [place] female never smoker followed for chronic coughseen for pulmonary consult 2012 Medical history significant for diabetes, hypertension, hyperlipidemia  TEST /Events  GI w/u 2004:  Mod BS nl, nl 24 h pH off all GI meds > rec d/c GERD rx  Per Dr Henrene Pastor   PFT  2012 showed mild restriction with no airflow obstruction and a diffusing defect.   CT chest 2017 showed stable linear scarring in both lungs stable groundglass opacities likely consistent with chronic inflammation. CT sinus February 2015 chronic sphenoid sinusitis septal deviation CT chest 04/05/13 >Patchy areas of paraseptal and central lobar emphysema. . Patchy ground-glass opacities suggest a partial airspace filling process and could reflect mild edema or inflammation. . Linear scarring changes in both lungs. 2018 Eosinophils 100  Chest x-ray September 2019 bronchitic changes with scarring in both lungs 04/2017 - nml ESR, neg ANA and RA factor   05/06/2018 Follow up : Cough  Patient returns for a 3-week follow-up .  Last visit had a cough flare.  She was given a prednisone taper.  Recommend to use Delsym and Tylenol 3 for cough control.  Instructed to increase gabapentin 300 mg to 4 times daily.  Since last visit patient is feeling some better but no improvement in cough . Says did take Tylenol #3 but did not help and her PCP does not want her taking Codeine. Tessalon does not help and can not afford.  No fever or discolored mucus .  Traveled from [place] 3 weeks ago. No fever or body aches.   We reviewed all patient's medications and organize them into a medication calendar.  Patient education was given. Appears to be taking meds correctly . On more than 25 meds.       Allergies  Allergen Reactions  . Ace  Inhibitors Cough  . Losartan Cough  . Penicillins Hives  . Sulfa Antibiotics Hives  . Sulfonamide Derivatives     Immunization History  Administered Date(s) Administered  . Influenza Split 11/24/2010  . Influenza Whole 11/30/2007, 12/05/2008, 12/04/2009  . Influenza, High Dose Seasonal PF 11/17/2013, 11/08/2014, 11/15/2015, 11/23/2016, 10/29/2017  . Influenza,inj,Quad PF,6+ Mos 11/18/2012  . PPD Test 02/16/2011, 04/14/2011  . Pneumococcal Conjugate-13 02/02/2014  . Pneumococcal Polysaccharide-23 12/09/2007  . Td 10/12/2006  . Zoster 06/04/2010    Past Medical History:  Diagnosis Date  . Diabetes mellitus   . Hyperlipidemia   . Hypertension   . Left rib fracture 02/18/2014    Tobacco History: Social History   Tobacco Use  Smoking Status Never Smoker  Smokeless Tobacco Never Used   Counseling given: Not Answered   Outpatient Medications Prior to Visit  Medication Sig Dispense Refill  . amLODipine (NORVASC) 10 MG tablet Take 1 tablet (10 mg total) by mouth daily. 90 tablet 1  . aspirin EC 81 MG tablet Take 81 mg by mouth daily.    Marland Kitchen atorvastatin (LIPITOR) 10 MG tablet TAKE 1 TABLET BY MOUTH  EVERY DAY 90 tablet 1  . Blood Glucose Monitoring Suppl (ONE TOUCH ULTRA 2) w/Device KIT USE AS DIRECTED TO TEST  BLOOD SUGAR TWO TIMES DAILY 1 each 1  . canagliflozin (INVOKANA) 100 MG TABS tablet Take 100 mg by mouth daily before breakfast.    . cyclobenzaprine (  FLEXERIL) 10 MG tablet TAKE 1 TABLET BY MOUTH TWO  TIMES DAILY AS NEEDED FOR  MUSCLE SPASM 180 tablet 0  . dextromethorphan (DELSYM) 30 MG/5ML liquid 2 tsp twice daily as needed for cough    . DULoxetine (CYMBALTA) 30 MG capsule TAKE 1 CAPSULE BY MOUTH  DAILY 90 capsule 1  . famotidine (PEPCID) 20 MG tablet Take 1 tablet (20 mg total) by mouth at bedtime. 90 tablet 3  . fluconazole (DIFLUCAN) 150 MG tablet TAKE 1 TABLET BY MOUTH ONCE AS NEEDED FOR VAGINAL  INFECTION. 1 tablet 0  . fluticasone (FLONASE) 50 MCG/ACT nasal  spray Place 2 sprays into both nostrils daily. 16 g 6  . fluticasone (FLONASE) 50 MCG/ACT nasal spray Place 2 sprays into both nostrils daily. 16 g 6  . gabapentin (NEURONTIN) 300 MG capsule Take 1 capsule (300 mg total) by mouth 3 (three) times daily. 270 capsule 1  . gabapentin (NEURONTIN) 300 MG capsule Take 1 capsule (300 mg total) by mouth 4 (four) times daily. 360 capsule 1  . glimepiride (AMARYL) 4 MG tablet TAKE 1 TABLET BY MOUTH  TWICE A DAY 180 tablet 1  . glucose blood (ONE TOUCH ULTRA TEST) test strip Use to test blood sugar 2 times daily as instructed. Dx code: E11.49 200 each 3  . glucose blood (ONE TOUCH ULTRA TEST) test strip USE TO TEST BLOOD SUGARS  TWO TIMES DAILY 200 each 1  . Insulin Detemir (LEVEMIR FLEXPEN) 100 UNIT/ML Pen Inject 14 Units into the skin at bedtime.  15 mL 3  . insulin lispro (HUMALOG KWIKPEN) 100 UNIT/ML KiwkPen 9 units with breakfast and lunch, 4 units at evening meal    . Insulin Pen Needle 32G X 4 MM MISC Use 3 per day 100 each 3  . latanoprost (XALATAN) 0.005 % ophthalmic solution Place 1 drop into both eyes at bedtime.    Marland Kitchen levocetirizine (XYZAL) 5 MG tablet TAKE 1 TABLET BY MOUTH  EVERY EVENING 90 tablet 1  . levothyroxine (SYNTHROID, LEVOTHROID) 25 MCG tablet TAKE 1 TABLET BY MOUTH  DAILY BEFORE BREAKFAST 90 tablet 1  . Melatonin 5 MG TABS Take 1 tablet by mouth at bedtime as needed.    . metFORMIN (GLUCOPHAGE) 1000 MG tablet TAKE 1 TABLET BY MOUTH TWO  TIMES DAILY WITH MEALS 180 tablet 1  . metoprolol succinate (TOPROL-XL) 100 MG 24 hr tablet TAKE 1 TABLET BY MOUTH  DAILY WITH OR IMMEDIATELY  FOLLOWING A MEAL 90 tablet 1  . mirtazapine (REMERON) 7.5 MG tablet TAKE 1 TABLET BY MOUTH AT  BEDTIME 90 tablet 1  . montelukast (SINGULAIR) 10 MG tablet TAKE 1 TABLET BY MOUTH AT  BEDTIME 90 tablet 1  . niacin (NIASPAN) 500 MG CR tablet Take 2 tablets (1,000 mg total) by mouth at bedtime. 90 tablet 1  . NONFORMULARY OR COMPOUNDED ITEM Lumbar support belt  #1   As directed 1 each 0  . nystatin ointment (MYCOSTATIN) Apply 1 application topically 2 (two) times daily as needed. 90 g 0  . ONE TOUCH ULTRA TEST test strip TEST TWO TIMES DAILY 200 each 2  . pantoprazole (PROTONIX) 40 MG tablet TAKE 1 TABLET BY MOUTH  DAILY 90 tablet 1  . predniSONE (DELTASONE) 10 MG tablet Take  4 each am x 2 days,   2 each am x 2 days,  1 each am x 2 days and stop 14 tablet 0  . vitamin E 400 UNIT capsule Take 400 Units by mouth daily.  No facility-administered medications prior to visit.      Review of Systems:   Constitutional:   No  weight loss, night sweats,  Fevers, chills,  +fatigue, or  lassitude.  HEENT:   No headaches,  Difficulty swallowing,  Tooth/dental problems, or  Sore throat,                No sneezing, itching, ear ache,  +nasal congestion, post nasal drip,   CV:  No chest pain,  Orthopnea, PND, swelling in lower extremities, anasarca, dizziness, palpitations, syncope.   GI  No heartburn, indigestion, abdominal pain, nausea, vomiting, diarrhea, change in bowel habits, loss of appetite, bloody stools.   Resp:   No chest wall deformity  Skin: no rash or lesions.  GU: no dysuria, change in color of urine, no urgency or frequency.  No flank pain, no hematuria   MS:  No joint pain or swelling.  No decreased range of motion.  No back pain.    Physical Exam  BP 112/68 (BP Location: Left Arm, Cuff Size: Normal)   Pulse 91   Ht '4\' 10"'$  (1.473 m)   Wt 140 lb 9.6 oz (63.8 kg)   SpO2 92%   BMI 29.39 kg/m   GEN: A/Ox3; pleasant , NAD, elderly    HEENT:  [place]/AT,  EACs-clear, TMs-wnl, NOSE-clear, THROAT-clear, no lesions, no postnasal drip or exudate noted.   NECK:  Supple w/ fair ROM; no JVD; normal carotid impulses w/o bruits; no thyromegaly or nodules palpated; no lymphadenopathy.    RESP  Clear  P & A; w/o, wheezes/ rales/ or rhonchi. no accessory muscle use, no dullness to percussion  CARD:  RRR, no m/r/g, no peripheral edema, pulses  intact, no cyanosis or clubbing.  GI:   Soft & nt; nml bowel sounds; no organomegaly or masses detected.   Musco: Warm bil, no deformities or joint swelling noted.   Neuro: alert, no focal deficits noted.    Skin: Warm, no lesions or rashes    Lab Results:  CBC  BNP No results found for: BNP  ProBNP No results found for: PROBNP  Imaging: No results found.    No flowsheet data found.  No results found for: NITRICOXIDE      Assessment & Plan:   Chronic cough Chronic cough refractory to aggressive cough regimen  Previous CT chest 2015 and 2017 stable scarring /inflammation  Will check HRCT chest - r/o ILD .  Previous CT sinus chronic sphenoid sinusitis .  Continue trigger control  Check PFT on return  Patient's medications were reviewed today and patient education was given. Computerized medication calendar was adjusted/completed  If not better , check allergy profile w/ IgE .  If able to check sputum culture , check AFB , Fungal and sputum cx .    Plan  Patient Instructions  Continue on current regimen  Follow medication calendar closely .  Set up for HRCT chest .  Follow up with Dr. Melvyn Novas  In 3 months with PFT  and As needed        GERD Continue on trigger control   Allergic rhinitis Continue on trigger prevention   Plan  Patient Instructions  Continue on current regimen  Follow medication calendar closely .  Set up for HRCT chest .  Follow up with Dr. Melvyn Novas  In 3 months with PFT  and As needed           Rexene Edison, NP 05/06/2018

## 2018-05-06 NOTE — Patient Instructions (Addendum)
Continue on current regimen  Follow medication calendar closely .  Set up for HRCT chest .  Follow up with Dr. Melvyn Novas  In 3 months with PFT  and As needed

## 2018-05-06 NOTE — Assessment & Plan Note (Signed)
Continue on trigger prevention   Plan  Patient Instructions  Continue on current regimen  Follow medication calendar closely .  Set up for HRCT chest .  Follow up with Dr. Melvyn Novas  In 3 months with PFT  and As needed

## 2018-05-06 NOTE — Assessment & Plan Note (Signed)
Continue on trigger control

## 2018-05-06 NOTE — Addendum Note (Signed)
Addended by: Karmen Stabs on: 05/06/2018 04:45 PM   Modules accepted: Orders

## 2018-05-12 NOTE — Progress Notes (Signed)
Chart and office note reviewed in detail  > agree with a/p as outlined    

## 2018-05-13 ENCOUNTER — Other Ambulatory Visit: Payer: Self-pay

## 2018-05-13 ENCOUNTER — Other Ambulatory Visit (HOSPITAL_BASED_OUTPATIENT_CLINIC_OR_DEPARTMENT_OTHER): Payer: Self-pay

## 2018-05-31 ENCOUNTER — Ambulatory Visit (INDEPENDENT_AMBULATORY_CARE_PROVIDER_SITE_OTHER): Payer: Medicare Other | Admitting: Internal Medicine

## 2018-05-31 ENCOUNTER — Telehealth: Payer: Self-pay | Admitting: Internal Medicine

## 2018-05-31 ENCOUNTER — Other Ambulatory Visit: Payer: Self-pay

## 2018-05-31 ENCOUNTER — Encounter: Payer: Self-pay | Admitting: Internal Medicine

## 2018-05-31 DIAGNOSIS — R053 Chronic cough: Secondary | ICD-10-CM

## 2018-05-31 DIAGNOSIS — R05 Cough: Secondary | ICD-10-CM | POA: Diagnosis not present

## 2018-05-31 MED ORDER — ACETAMINOPHEN-CODEINE #3 300-30 MG PO TABS: 2.0000 | ORAL_TABLET | ORAL | 0 refills | Status: AC | PRN

## 2018-05-31 MED ORDER — PREDNISONE 10 MG PO TABS: ORAL_TABLET | ORAL | 0 refills | Status: DC

## 2018-05-31 MED FILL — predniSONE 10 MG TABS: 10 | 6 days supply | Qty: 14 | Fill #0

## 2018-05-31 MED FILL — ACETAMINOPHEN/COD #3 TABLET: 300-30 | 5 days supply | Qty: 60 | Fill #0

## 2018-05-31 NOTE — Patient Instructions (Addendum)
Take delsym two tsp every 12 hours and supplement if needed with  Tylenol #3   up to 1-2 every 4 hours to suppress the urge to cough. Swallowing water and/or using ice chips/non mint and menthol containing candies (such as lifesavers or sugarless jolly ranchers) are also effective.  You should rest your voice and avoid activities that you know make you cough.  Once you have eliminated the cough for 3 straight days try reducing the Tylenol #3 first,  then the delsym as tolerated.    Take chlortrimeton 4 mg x 2 at bedtime and continue to take it daytime for drainage up to 1-2 every 4 hours    Prednisone 10 mg take  4 each am x 2 days,   2 each am x 2 days,  1 each am x 2 days and stop    If not better  Please schedule a follow up office visit in 2 weeks, call sooner if needed with all medications /inhalers/ solutions in hand so we can verify exactly what you are taking. This includes all medications from all doctors and over the [place] separate them into two bags:  the ones you take automatically, no matter what, vs the ones you take just when you feel you need them "BAG #2 is UP TO YOU"  - this will really help [place] help you take your medications more effectively.

## 2018-05-31 NOTE — Progress Notes (Signed)
Subjective:     Patient ID: Mary Flynn, female   DOB: [birth year] MRN: [id number]    Brief patient profile:  84 yo [place] female never smoker  no childhood resp problems including pregancy then around 2004 began a pattern of recurrent cyclical cough  pattern lasting from sev  months up to a year and then gone up to 4-5 months self referred to pulmonary clinic 01/11/2013 for re-eval of recurrent persistent cough - Workup including allergy testing,barium swallow, 24hour pH probe nonrevealing previously     History of Present Illness  01/11/2013 1st [facility] office visit/ Latrica Clowers in EMR era with recurrent cough Chief Complaint  Patient presents with  . Acute Visit    Pt c/o increased cough x 2 wks- prod with large amounts of clear sputum.  She states that she coughs until she gags and vomits.   cough tends to be worse with any thing in her mouth and with talking.  Actual mucus production is min and "nothing ever works" to include abx, prednisone, inhalers, gerd rx. eval at [facility] with conclusion "this isn't reflux" Already eval 01/02/13 by Dr Larose Kells rx with zpack with sorethroat that has resolved  >>Stop cozar (losartan), stop calcium carbonate, and the fosfamax, Benicar 20 mg one daily  Protonix 40 mg Take 30- 60 min before your first and last meals of the day  Neurontin (gabapentin) 100 mg take 2 with every meal Chlortrimeton 4 mg at bedtime    01/25/2013 Follow up and med review  Patient returns for a two-week followup and medication review.  We reviewed all her medications organized them and updated MAR (med calendar database was down)  with patient education. Appears that she is taking her medications correctly. Patient reports that her cough is much improved. Patient denies any hemoptysis, chest pain, orthopnea, PND, or leg splint. Last visit. Patient was taken off olmesartan, calcium, and Fosamax. She was started on Benicar 20 mg daily. Treated for reflux, prevention regimen with  Protonix twice daily. Her Neurontin was increased to 200 mg 3 times daily. As you start on Chlor-Trimeton 4 mg at bedtime. Chest x-ray last visit showed a clearing of the left lower lobe pneumonia. rec Protonix 40 mg Take 30- 60 min before your first and last meals of the day  Neurontin (gabapentin) 100 mg take 2 with every meal Chlortrimeton 4 mg every 4hr as needed.  Take delsym two tsp every 12 hours and supplement if needed with  tramadol 50 mg up to 2 every 4 hours as needed to suppress the urge to cough.   GERD diet    02/22/2013 f/u ov/Ziyon Soltau re: chronic cough/ no med calendar Chief Complaint  Patient presents with  . Follow-up    Pt states her symptoms have improved greatly.  Still some nonprod cough and SOB with exertion, excessive talking.     >>neurontin 300 Three times a day        11/11/2017  Pulmonary  consulation / Asmaa Tirpak recurrent cough since 08/2017 / no med calendar, confused with med names Chief Complaint  Patient presents with  . Pulmonary Consult    Referred by Dr Earlie Counts. Pt c/o increased cough x 2-3 months. She states eating and coughing can trigger the cough.  She is coughing up white sputum. Cough occ wakes her up in the night. She also c/o watery eyes and runny nose.    after flew to [place] summer 2019 > recurrent cough p two weeks into the trip "felt like a  cold but it's gone" Gradually worse since with use of voice Bed Is flat  Taking ppi pc and cough drops  Unless coughing > Not limited by breathing from desired activities  No better on singulair / flonase/xyzal and only taking gabapentin twice daily  rec The key to effective treatment for your cough is eliminating the non-stop cycle of cough you're stuck in long enough to let your airway heal completely and then see if there is anything still making you cough once you stop the cough suppression, but this should take no more than 5 days to figure out First take delsym two tsp every 12 hours and  supplement if needed with  tramadol 50 mg up to 1  every 4 hours to suppress the urge to cough at all or even clear your throat. Swallowing water or using ice chips/non mint and menthol containing candies (such as lifesavers or sugarless jolly ranchers) are also effective.  You should rest your voice and avoid activities that you know make you cough. Once you have eliminated the cough for 3 straight days try reducing the tramadol first,  then the delsym as tolerated.  Increase gabapentin to 300 mg three times a day    12/28/2017 acute extended ov/Humbert Morozov re: cough since 08/2017  Chief Complaint  Patient presents with  . Acute Visit    Cough has been worse over the past wk.  She is coughing up white sputum.  She has noticed that talking can trigger the cough.  She has had some choking when she eats.   never really 100% better since 08/2017 with day > noct min prod cough assoc with eating and speaking then abruptly much worse x one week but same overall pattern   No sob unless coughing Sleeping flat ok rec For drainage / throat tickle try take CHLORPHENIRAMINE  4 mg - take one every 4 hours as needed - available over the counter- may cause drowsiness so start with just a bedtime dose or two a  Prednisone 10 mg take  4 each am x 2 days,   2 each am x 2 days,  1 each am x 2 days and stop  Take delsym two tsp every 12 hours and supplement if needed with  Tylenol #3  up to 1 every 4 hours Increase gabapentin to 300 mg four times daily  Follow the calendar as written  Please schedule a follow up office visit in 2 months , call sooner if needed with all medications /inhalers/ solutions in hand    04/20/2018 extended  ov/Kaneshia Cater re:  uacs  - 75% better p last ov and sustained for about a month - no meds or med calendar in hand Chief Complaint  Patient presents with  . Acute Visit    Pt c/o increased cough with white sputum for the past 2-3 months. Talking can trigger her to cough.   Dyspnea:  Ok unless  coughing Cough: min productive day >> noct, talking makes it worse, sweets makes it worse eg orange juice  Sleeping: less likely cough while sleeping / does not raise hob  SABA use: no better with variable inhalers from [place]  rec For drainage / throat tickle try take CHLORPHENIRAMINE  4 mg   Prednisone 10 mg take  4 each am x 2 days,   2 each am x 2 days,  1 each am x 2 days and stop  ake delsym two tsp every 12 hours and supplement if needed with  Tylenol #  3  up to 1 every 4 hours Increase gabapentin to 300 mg four times daily      Virtual Visit via Telephone Note 05/31/2018   I connected with Marilynne Halsted on 05/31/18 at  4:00 PM EDT by telephone and verified that I am speaking with the correct person using two identifiers.   I discussed the limitations, risks, security and privacy concerns of performing an evaluation and management service by telephone and the availability of in person appointments. I also discussed with the patient that there may be a patient responsible charge related to this service. The patient expressed understanding and agreed to proceed.   History of Present Illness: Cough x 08/2017 24/7 assoc with pnds and eyes watering worse with spring onset  Dyspnea:  No sob unless choking from cough  Cough: mostly just dry hack water helps  Sleeping: flat in bed / does not use bedblocks  SABA use: none 02: none    No obvious day to day or daytime variability or assoc excess/ purulent sputum or mucus plugs or hemoptysis or cp or chest tightness, subjective wheeze or overt sinus or hb symptoms.    Also denies any obvious fluctuation of symptoms with weather or environmental changes or other aggravating or alleviating factors except as outlined above.   Meds reviewed/ med reconciliation completed         Observations/Objective: slt hoarse phonation/ cough with voice use sounds coarse/ harsh   Assessment and Plan: See problem list for active a/p's   Follow Up  Instructions: See avs for instructions unique to this ov which includes revised/ updated med list     I discussed the assessment and treatment plan with the patient. The patient was provided an opportunity to ask questions and all were answered. The patient agreed with the plan and demonstrated an understanding of the instructions.   The patient was advised to call back or seek an in-person evaluation if the symptoms worsen or if the condition fails to improve as anticipated.  I provided 30  minutes of non-face-to-face time during this encounter.   Christinia Gully, MD

## 2018-05-31 NOTE — Telephone Encounter (Signed)
Patient would like tele-visit for coughing and sob no fever. Visit set for Mw at 4 nothing further needed at this time.

## 2018-05-31 NOTE — Assessment & Plan Note (Signed)
Onset 2004  - GI w/u 2004:  Mod BS nl, nl 24 h pH off all GI meds > rec d/c GERD rx  Per Dr Henrene Pastor  -PFT 2012 normal -no airflow obstruction, no restriction, moderately reduced diffusing capacity - increased neurontin to 200 tid 01/11/2013 > 300 mg tid as of 02/22/2013  -med calendar 03/10/13 and confirmed adherence -CT sinus 04/05/13 >Chronic sphenoid sinusitis. Slight nasal septal deviation right to left. -CT chest 04/05/13 >Patchy areas of paraseptal and central lobar emphysema. . Patchy ground-glass opacities suggest a partial airspace filling process and could reflect mild edema or inflammation. . Linear scarring changes in both lungs. - 08/23/2013 reporting 100% resolution on neurontin 300 tid rec taper off x one month - 11/11/2017 re-eval for cough with  flare 08/2017 since uri ? Related to air travel> rec cyclical cough regimen and increase gabapentin back to 300 tid.  - 12/28/2017 increased gabapentin to 300 mg qid and refer to Dr Eliott [facility] = next step > did not go - 04/20/2018 relapsed cough on ?  rx > rec resume gabapentin 300 mg qid and 1st gen H1 blockers per guidelines if tol then return for med reconciliation    05/31/2018 will try again cyclical cough rx using tyl #3 up to 2 q 4 h to shut down cough / should use 60 x 5 days and off and supplement with 1st gen H1 blockers per guidelines     Exhaustive discussion of failure of each rx to date but not clear to me she's actually following these instructions as written.  Each maintenance medication was reviewed in detail including most importantly the difference between maintenance and as needed and under what circumstances the prns are to be used. This was done in the context of a medication calendar review which provided the patient with a user-friendly unambiguous mechanism for medication administration and reconciliation and provides an action plan for all active problems. It is critical that this be shown to every doctor  for  modification during the office visit if necessary so the patient can use it as a working document.      Will do pharmacy record review when she return in 2 weeks with all meds in hand using a trust but verify approach to confirm accurate Medication  Reconciliation The principal here is that until we are certain that the  patients are doing what we've asked, it makes no sense to ask them to do more.

## 2018-06-09 ENCOUNTER — Other Ambulatory Visit: Payer: Self-pay | Admitting: Family

## 2018-06-11 ENCOUNTER — Other Ambulatory Visit: Payer: Self-pay | Admitting: Family

## 2018-06-13 ENCOUNTER — Telehealth: Payer: Self-pay | Admitting: Internal Medicine

## 2018-06-13 ENCOUNTER — Other Ambulatory Visit: Payer: Self-pay

## 2018-06-13 MED ORDER — FLUCONAZOLE 150 MG PO TABS: 150.0000 mg | ORAL_TABLET | Freq: Once | ORAL | 0 refills | Status: AC

## 2018-06-13 MED ORDER — FLUCONAZOLE 150 MG PO TABS: 150.0000 mg | ORAL_TABLET | Freq: Once | ORAL | 0 refills | Status: DC

## 2018-06-13 NOTE — Telephone Encounter (Signed)
Rx sent for diflucan at pt request. She is due for OV. Please contact her to schedule a virtual visit.

## 2018-06-13 NOTE — Telephone Encounter (Signed)
Checked pt's med list and saw that a 90 day supply of gabapentin was sent to [facility] on 04/26/2018 with #360 with 1 RF.  Called and spoke with pt in regards to this but then while speaking with pt, pt started talking with other people in the background and never came back to the phone.   Called [facility] and spoke with Darlene to see if they had received the 90-day Rx for pt's gabapentin and per Carlyon Shadow, they did receive it and it was filled and shipped to pt 04/30/2018 for the 90-day supply. Nothing further needed.

## 2018-06-13 NOTE — Telephone Encounter (Signed)
Patient called to know status on her prescription and to be sure its sent for 3 month supply

## 2018-06-13 NOTE — Telephone Encounter (Signed)
I had to change the rx from 1 tab to 3 tablets, 1 tab prn for 3 months supply per patient's request. If she has a 3 moths supply she will not be charge. Appointment was scheduled for patient to have telephone visit tomorrow morning.

## 2018-06-13 NOTE — Telephone Encounter (Signed)
Requested medication (s) are due for refill today: ?  Requested medication (s) are on the active medication list: yes  Last refill:  03/02/2018  Future visit scheduled: no  Notes to clinic: no protocol    Requested Prescriptions  Pending Prescriptions Disp Refills   fluconazole (DIFLUCAN) 150 MG tablet [Pharmacy Med Name: FLUCONAZOLE  150MG   TAB] 1 tablet 0    Sig: TAKE 1 TABLET BY MOUTH ONCE AS NEEDED FOR VAGINAL  INFECTION.     Off-Protocol Failed - 06/13/2018 10:23 AM      Failed - Medication not assigned to a protocol, review manually.      Passed - Valid encounter within last 12 months    Recent Outpatient Visits          5 months ago Preventative health care   [facility] [place], NP   6 months ago Controlled type 2 diabetes mellitus with diabetic neuropathy, without long-term current use of insulin ([place])   [facility] [place], NP   7 months ago Cough   [place] at [place], NP   12 months ago Controlled type 2 diabetes mellitus with diabetic neuropathy, without long-term current use of insulin ([place])   [facility] [place], NP   1 year ago [place] [facility] [place], NP      Future Appointments            In 1 month Melvyn Novas, Christena Deem, MD [facility]

## 2018-06-14 ENCOUNTER — Other Ambulatory Visit: Payer: Self-pay

## 2018-06-14 ENCOUNTER — Ambulatory Visit (INDEPENDENT_AMBULATORY_CARE_PROVIDER_SITE_OTHER): Payer: Medicare Other | Admitting: Family

## 2018-06-14 DIAGNOSIS — F329 Major depressive disorder, single episode, unspecified: Secondary | ICD-10-CM

## 2018-06-14 DIAGNOSIS — I10 Essential (primary) hypertension: Secondary | ICD-10-CM

## 2018-06-14 DIAGNOSIS — E1142 Type 2 diabetes mellitus with diabetic polyneuropathy: Secondary | ICD-10-CM | POA: Diagnosis not present

## 2018-06-14 DIAGNOSIS — F32A Depression, unspecified: Secondary | ICD-10-CM

## 2018-06-14 DIAGNOSIS — N761 Subacute and chronic vaginitis: Secondary | ICD-10-CM

## 2018-06-14 MED ORDER — FLUCONAZOLE 150 MG PO TABS: ORAL_TABLET | ORAL | 0 refills | Status: DC

## 2018-06-14 NOTE — Progress Notes (Signed)
Virtual Visit via Video Note  I connected with Mary Flynn on 06/14/18 at 10:00 AM EDT by a video enabled telemedicine application and verified that I am speaking with the correct person using two identifiers. This visit type was conducted due to national recommendations for restrictions regarding the COVID-19 Pandemic (e.g. social distancing).  This format is felt to be most appropriate for this patient at this time.   I discussed the limitations of evaluation and management by telemedicine and the availability of in person appointments. The patient expressed understanding and agreed to proceed.  Only the patient and myself were on today's video visit. The patient was at home and I was in my office at the time of today's visit.   History of Present Illness:   1) DM2- reports that she continues to follow with Dr. Dwyane Dee.  Reports that she gets insulin from [place] because it is cheaper. She is past due for follow up with Dr. Dwyane Dee.   Lab Results  Component Value Date   HGBA1C 7.9 (H) 10/29/2017   HGBA1C 7.7 (H) 04/05/2017   HGBA1C 7.6 11/27/2016   Lab Results  Component Value Date   MICROALBUR 1.5 02/04/2017   LDLCALC 42 12/21/2017   CREATININE 0.71 12/21/2017   HTN- She is not taking her bp reading at home. BP meds include toprol xl 100 and amlodipine. BP Readings from Last 3 Encounters:  05/06/18 112/68  04/20/18 104/64  12/28/17 124/70   Hypthyroid- continues synthroid. Feeling well on this dose.  Lab Results  Component Value Date   TSH 2.33 10/29/2017   Vaginitis- reports intermittent yeast infections. Requests rx for 3 tabs of diflucan to be sent to her mail order to have on hand and because it is cheaper.   Lab Results  Component Value Date   CHOL 113 12/21/2017   HDL 50.00 12/21/2017   LDLCALC 42 12/21/2017   TRIG 108.0 12/21/2017   CHOLHDL 2 12/21/2017   Reports that she has been more active and has lost 6 pounds as a result.    Reports mood is good on cymbalta.  Sleeping better on cymbalta.    Reprots that allergies are stable. Reports that she rescheduled her derm appointment due to covid but has some skin itching that she plans to discuss with them when she can get in to see them.    Observations/Objective:   Gen: Awake, alert, no acute distress Resp: Breathing is even and non-labored Psych: calm/pleasant demeanor Neuro: Alert and Oriented x 3, + facial symmetry, speech is clear.   Assessment and Plan:  1) DM2- overdue for follow up with endo.  I advised the patient to call her endocrinology office to schedule a virtual visit for follow-up.  I commended her on her recent exercise and weight loss.  Hypertension- feeling well on current medication.  Recent blood pressures have been stable.  Plan to repeat blood pressure at her 29-month face-to-face visit here in our office.  Vaginitis-refill has been sent for as needed Diflucan.  Depression-stable on Cymbalta.  Continue same.  Follow Up Instructions:    I discussed the assessment and treatment plan with the patient. The patient was provided an opportunity to ask questions and all were answered. The patient agreed with the plan and demonstrated an understanding of the instructions.   The patient was advised to call back or seek an in-person evaluation if the symptoms worsen or if the condition fails to improve as anticipated.    Nance Pear, NP

## 2018-06-17 ENCOUNTER — Telehealth: Payer: Self-pay | Admitting: Internal Medicine

## 2018-06-17 MED ORDER — PREDNISONE 10 MG PO TABS: ORAL_TABLET | ORAL | 0 refills | Status: DC

## 2018-06-17 MED FILL — predniSONE 10 MG TABS: 10 | 6 days supply | Qty: 14 | Fill #0

## 2018-06-17 NOTE — Telephone Encounter (Signed)
Primary Pulmonologist: Dr. Christinia Gully Last office visit and with whom: 05/31/2018 w/ MW What do we see them for (pulmonary problems): Chronic Cough  Reason for call: Pt states her cough has gotten worse over the past two days. States she has trouble getting mucus up, but when she can it is white in color. Recently completed prednisone 10 mg taper from LOV. Taking chlortrimeton, delsym, and Tylenol #3 as directed. States she has "tried everything" and her cough still persists, has SOB & wheezing in the last month. Denies fever/chills/sweats. Denies chest tightness/pain. Not currently on any inhaler therapy.  In the last month, have you been in contact with someone who was confirmed or suspected to have [place]avirus / COVID-19?  No  Do you have any of the following symptoms developed in the last 30 days? Fever: No Cough: Yes Shortness of breath: Yes  When did your symptoms start?  2 days ago.   If the patient has a fever, what is the last reading?  (use n/a if patient denies fever)  N/A . IF THE PATIENT STATES THEY DO NOT OWN A THERMOMETER, THEY MUST GO AND PURCHASE ONE When did the fever start?: N/A Have you taken any medication to suppress a fever (ie Ibuprofen, Aleve, Tylenol)?: N/A  MW, please advise on your recommendations for this pt. Thank you.

## 2018-06-17 NOTE — Telephone Encounter (Signed)
See last televisit - she implied the cough was much better when I did f/u call 06/13/18 and now saying "nothing works"    If indeed was better on prednisone then fine to do Prednisone 10 mg take  4 each am x 2 days,   2 each am x 2 days,  1 each am x 2 days and stop but must come back w/in a week with all meds in hand or will not be able to offer anything else going forward s ov

## 2018-06-17 NOTE — Telephone Encounter (Addendum)
Called and spoke with pt letting her know the info per MW and stated to her we could send pred taper Rx to pharmacy for her but we needed to schedule visit for her with MW in 1 week to follow up. Pt expressed understanding. Verified pt's preferred pharmacy and sent Rx in and also scheduled pt visit with MW 5/4. Nothing further needed.

## 2018-06-27 ENCOUNTER — Encounter: Payer: Self-pay | Admitting: Internal Medicine

## 2018-06-27 ENCOUNTER — Ambulatory Visit (INDEPENDENT_AMBULATORY_CARE_PROVIDER_SITE_OTHER): Payer: Medicare Other | Admitting: Internal Medicine

## 2018-06-27 ENCOUNTER — Other Ambulatory Visit: Payer: Self-pay

## 2018-06-27 ENCOUNTER — Telehealth: Payer: Self-pay | Admitting: Internal Medicine

## 2018-06-27 VITALS — BP 110/72 | HR 87 | Temp 98.1°F | Ht <= 58 in | Wt 135.0 lb

## 2018-06-27 DIAGNOSIS — R05 Cough: Secondary | ICD-10-CM

## 2018-06-27 DIAGNOSIS — R053 Chronic cough: Secondary | ICD-10-CM

## 2018-06-27 MED ORDER — FLUCONAZOLE 150 MG PO TABS: ORAL_TABLET | ORAL | 0 refills | Status: DC

## 2018-06-27 MED ORDER — PREDNISONE 10 MG PO TABS: ORAL_TABLET | ORAL | 0 refills | Status: DC

## 2018-06-27 MED FILL — FLUCONAZOLE 150 MG TABS: 150 | 3 days supply | Qty: 3 | Fill #0

## 2018-06-27 MED FILL — predniSONE 10 MG TABS: 10 | 6 days supply | Qty: 14 | Fill #0

## 2018-06-27 NOTE — Telephone Encounter (Signed)
Called and spoke with patient regarding ov with MW today She states her throat still hurts and wants to know what to do Advised patient what MW instructed on AVS for today's ov  3. Tanda Rockers, MD (Physician) at 06/27/2018 11:11 AM - Signed    Bedblocks are 6 - 8 inches at [email or web address] or [employer] or mattress   Take chlorpheniramine 4 mg x 2 at bedtime and up to 1 every 4 hours during the day as needed  Prednisone 10 mg take  4 each am x 2 days,   2 each am x 2 days,  1 each am x 2 days and stop  Please see patient coordinator before you leave today  to schedule Dr [facility]ins at [facility]  Please schedule a follow up office visit in 4 weeks, call sooner if needed with all medications /inhalers/ solutions in hand so we can verify exactly what you are taking. This includes all medications from all doctors and over the [place] separate them into two bags:  the ones you take automatically, no matter what, vs the ones you take just when you feel you need them "BAG #2 is UP TO YOU"  - this will really help [place] help you take your medications more effectively.      Pt expressed and verbalized understanding Nothing further needed at this time

## 2018-06-27 NOTE — Progress Notes (Signed)
Subjective:     Patient ID: Mary Flynn, female   DOB: February 17, 1935 MRN: [id number]    Brief patient profile:  84 yo [place] female never smoker  no childhood resp problems including pregancy then around 2001 began a pattern of recurrent cyclical cough  pattern lasting from sev  months up to a year and then resolves s specific rx or need for maint rx  up to 4-5 months self referred to pulmonary clinic 01/11/2013 for re-eval of recurrent persistent cough - Workup including allergy testing,barium swallow, 24hour pH probe nonrevealing previously     History of Present Illness  01/11/2013 1st [facility] office visit/ Mary Flynn in EMR era with recurrent cough Chief Complaint  Patient presents with  . Acute Visit    Pt c/o increased cough x 2 wks- prod with large amounts of clear sputum.  She states that she coughs until she gags and vomits.   cough tends to be worse with any thing in her mouth and with talking.  Actual mucus production is min and "nothing ever works" to include abx, prednisone, inhalers, gerd rx. eval at [facility] with conclusion "this isn't reflux" Already eval 01/02/13 by Dr Larose Kells rx with zpack with sorethroat that has resolved  >>Stop cozar (losartan), stop calcium carbonate, and the fosfamax, Benicar 20 mg one daily  Protonix 40 mg Take 30- 60 min before your first and last meals of the day  Neurontin (gabapentin) 100 mg take 2 with every meal Chlortrimeton 4 mg at bedtime    01/25/2013 Follow up and med review  Patient returns for a two-week followup and medication review.  We reviewed all her medications organized them and updated MAR (med calendar database was down)  with patient education. Appears that she is taking her medications correctly. Patient reports that her cough is much improved. Patient denies any hemoptysis, chest pain, orthopnea, PND, or leg splint. Last visit. Patient was taken off olmesartan, calcium, and Fosamax. She was started on Benicar 20 mg daily.  Treated for reflux, prevention regimen with Protonix twice daily. Her Neurontin was increased to 200 mg 3 times daily and  Chlor-Trimeton 4 mg at bedtime. Chest x-ray last visit showed a clearing of the left lower lobe pneumonia. rec Protonix 40 mg Take 30- 60 min before your first and last meals of the day  Neurontin (gabapentin) 100 mg take 2 with every meal Chlortrimeton 4 mg every 4hr as needed.  Take delsym two tsp every 12 hours and supplement if needed with  tramadol 50 mg up to 2 every 4 hours as needed to suppress the urge to cough.   GERD diet         11/11/2017  Pulmonary  consulation / Mary Flynn recurrent cough since 08/2017 / no med calendar, confused with med names Chief Complaint  Patient presents with  . Pulmonary Consult    Referred by Dr Earlie Counts. Pt c/o increased cough x 2-3 months. She states eating and coughing can trigger the cough.  She is coughing up white sputum. Cough occ wakes her up in the night. She also c/o watery eyes and runny nose.    after flew to [place] summer 2019 > recurrent cough p two weeks into the trip "felt like a cold but it's gone" Gradually worse since with use of voice Bed Is flat  Taking ppi pc and cough drops  Unless coughing > Not limited by breathing from desired activities  No better on singulair / flonase/xyzal and only taking gabapentin twice  daily  rec The key to effective treatment for your cough is eliminating the non-stop cycle of cough you're stuck in long enough to let your airway heal completely and then see if there is anything still making you cough once you stop the cough suppression, but this should take no more than 5 days to figure out First take delsym two tsp every 12 hours and supplement if needed with  tramadol 50 mg up to 1  every 4 hours to suppress the urge to cough at all or even clear your throat. Swallowing water or using ice chips/non mint and menthol containing candies (such as lifesavers or sugarless jolly  ranchers) are also effective.  You should rest your voice and avoid activities that you know make you cough. Once you have eliminated the cough for 3 straight days try reducing the tramadol first,  then the delsym as tolerated.  Increase gabapentin to 300 mg three times a day    12/28/2017 acute extended ov/Mary Flynn re: cough since 08/2017  Chief Complaint  Patient presents with  . Acute Visit    Cough has been worse over the past wk.  She is coughing up white sputum.  She has noticed that talking can trigger the cough.  She has had some choking when she eats.   never really 100% better since 08/2017 with day > noct min prod cough assoc with eating and speaking then abruptly much worse x one week but same overall pattern   No sob unless coughing Sleeping flat ok rec For drainage / throat tickle try take CHLORPHENIRAMINE  4 mg - take one every 4 hours as needed - available over the counter- may cause drowsiness so start with just a bedtime dose or two a  Prednisone 10 mg take  4 each am x 2 days,   2 each am x 2 days,  1 each am x 2 days and stop  Take delsym two tsp every 12 hours and supplement if needed with  Tylenol #3  up to 1 every 4 hours Increase gabapentin to 300 mg four times daily  Follow the calendar as written  Please schedule a follow up office visit in 2 months , call sooner if needed with all medications /inhalers/ solutions in hand    04/20/2018 extended  ov/Mary Flynn re:  uacs  - 75% better p last ov and sustained for about a month - no meds or med calendar in hand Chief Complaint  Patient presents with  . Acute Visit    Pt c/o increased cough with white sputum for the past 2-3 months. Talking can trigger her to cough.   Dyspnea:  Ok unless coughing Cough: min productive day >> noct, talking makes it worse, sweets makes it worse eg orange juice  Sleeping: less likely cough while sleeping / does not raise hob  SABA use: no better with variable inhalers from [place]  rec For  drainage / throat tickle try take CHLORPHENIRAMINE  4 mg (chlortabs 4 mg at [facility])  - take one every 4 hours as needed -  Prednisone 10 mg take  4 each am x 2 days,   2 each am x 2 days,  1 each am x 2 days and stop  Take delsym two tsp every 12 hours and supplement if needed with  Tylenol #3  up to 1 every 4 hours to suppress the urge to cough. Swallowing water and/or using ice chips/non mint and menthol containing candies (such as lifesavers or sugarless  jolly ranchers) are also effective.  You should rest your voice and avoid activities that you know make you cough. Once you have eliminated the cough for 3 straight days try reducing the tylenol #3 first,  then the delsym as tolerated.   Increase gabapentin to 300 mg four times daily   See Tammy NP in  2 weeks with all your medications > med calendar> "She didn't give me one" (documents and copies say she did      06/27/2018  f/u ov/Mary Flynn re: cough summer 2019 maint on xyazol, singulair, gabapentin 300 tid  Chief Complaint  Patient presents with  . Follow-up    Cough is much improved.   Dyspnea:  Mailbox and back sob slt incline x 73f Cough: still waking up 2-3 x per night and sips water or candy seem to help and goes back bed  Sleeping: no bed blocks  SABA use: none help 02: none Has changed hx once again to suggest that she does improve short term with pred/tylenoel #3 short term only but never has relief from sense of ST/globus since onset of cough summer 2019    No obvious patterns in day to day or daytime variability or assoc excess/ purulent sputum or mucus plugs or hemoptysis or cp or chest tightness, subjective wheeze or overt sinus or hb symptoms.     Also denies any obvious fluctuation of symptoms with weather or environmental changes or other aggravating or alleviating factors except as outlined above   No unusual exposure hx or h/o childhood pna/ asthma or knowledge of premature birth.  Current Allergies, Complete Past  Medical History, Past Surgical History, Family History, and Social History were reviewed in [employer].  ROS  The following are not active complaints unless bolded Hoarseness, sore throat, dysphagia, dental problems, itching, sneezing,  nasal congestion or discharge of excess mucus or purulent secretions, ear ache,   fever, chills, sweats, unintended wt loss or wt gain, classically pleuritic or exertional cp,  orthopnea pnd or arm/hand swelling  or leg swelling, presyncope, palpitations, abdominal pain, anorexia, nausea, vomiting, diarrhea  or change in bowel habits or change in bladder habits, change in stools or change in urine, dysuria, hematuria,  rash, arthralgias, visual complaints, headache, numbness, weakness or ataxia or problems with walking or coordination,  change in mood or  memory.        Current Meds - - NOTE:   Unable to verify as accurately reflecting what pt takes   but pharmacy records indiate refilling gabapentin, pepcid flonase, singulair and protonix/pepcid x 3 m supplies   Medication Sig  . amLODipine (NORVASC) 10 MG tablet Take 1 tablet (10 mg total) by mouth daily.  .Marland Kitchenaspirin EC 81 MG tablet Take 81 mg by mouth daily.  .Marland Kitchenatorvastatin (LIPITOR) 10 MG tablet TAKE 1 TABLET BY MOUTH  EVERY DAY  . b complex vitamins tablet Take 1 tablet by mouth daily.  . Blood Glucose Monitoring Suppl (ONE TOUCH ULTRA 2) w/Device KIT USE AS DIRECTED TO TEST  BLOOD SUGAR TWO TIMES DAILY  . calcium citrate-vitamin D (CITRACAL+D) 315-200 MG-UNIT tablet Take 1 tablet by mouth 2 (two) times daily.  . canagliflozin (INVOKANA) 100 MG TABS tablet Take 100 mg by mouth daily before breakfast.  . chlorpheniramine (CHLOR-TRIMETON) 4 MG tablet Take 4 mg by mouth every 4 (four) hours as needed for allergies.  . cyclobenzaprine (FLEXERIL) 10 MG tablet TAKE 1 TABLET BY MOUTH TWO  TIMES DAILY AS NEEDED FOR  MUSCLE SPASM  . dextromethorphan (DELSYM) 30 MG/5ML liquid 2 tsp twice  daily as needed for cough  . DULoxetine (CYMBALTA) 30 MG capsule TAKE 1 CAPSULE BY MOUTH  DAILY  . famotidine (PEPCID) 20 MG tablet Take 1 tablet (20 mg total) by mouth at bedtime.  . fluconazole (DIFLUCAN) 150 MG tablet Take 1 tab by mouth once as needed for yeast infection  . fluticasone (FLONASE) 50 MCG/ACT nasal spray Place 2 sprays into both nostrils daily.  Marland Kitchen gabapentin (NEURONTIN) 300 MG capsule Take 1 capsule (300 mg total) by mouth 4 (four) times daily.  Marland Kitchen glimepiride (AMARYL) 4 MG tablet TAKE 1 TABLET BY MOUTH  TWICE A DAY  . glucose blood (ONE TOUCH ULTRA TEST) test strip Use to test blood sugar 2 times daily as instructed. Dx code: E11.49  . glucose blood (ONE TOUCH ULTRA TEST) test strip USE TO TEST BLOOD SUGARS  TWO TIMES DAILY  . Insulin Detemir (LEVEMIR FLEXPEN) 100 UNIT/ML Pen Inject 14 Units into the skin at bedtime.   . insulin lispro (HUMALOG KWIKPEN) 100 UNIT/ML KiwkPen 9 units with breakfast and lunch, 4 units at evening meal  . Insulin Pen Needle 32G X 4 MM MISC Use 3 per day  . latanoprost (XALATAN) 0.005 % ophthalmic solution Place 1 drop into both eyes at bedtime.  Marland Kitchen levocetirizine (XYZAL) 5 MG tablet TAKE 1 TABLET BY MOUTH  EVERY EVENING  . levothyroxine (SYNTHROID, LEVOTHROID) 25 MCG tablet TAKE 1 TABLET BY MOUTH  DAILY BEFORE BREAKFAST  . Melatonin 5 MG TABS Take 1 tablet by mouth at bedtime as needed.  . metFORMIN (GLUCOPHAGE) 1000 MG tablet TAKE 1 TABLET BY MOUTH TWO  TIMES DAILY WITH MEALS  . metoprolol succinate (TOPROL-XL) 100 MG 24 hr tablet TAKE 1 TABLET BY MOUTH  DAILY WITH OR IMMEDIATELY  FOLLOWING A MEAL  . mirtazapine (REMERON) 7.5 MG tablet TAKE 1 TABLET BY MOUTH AT  BEDTIME  . montelukast (SINGULAIR) 10 MG tablet TAKE 1 TABLET BY MOUTH AT  BEDTIME  . Multiple Vitamins-Minerals (HAIR SKIN AND NAILS FORMULA PO) Take by mouth daily.  . niacin (NIASPAN) 500 MG CR tablet Take 2 tablets (1,000 mg total) by mouth at bedtime.  . NONFORMULARY OR COMPOUNDED ITEM  Lumbar support belt  #1  As directed  . nystatin ointment (MYCOSTATIN) Apply 1 application topically 2 (two) times daily as needed.  . ONE TOUCH ULTRA TEST test strip TEST TWO TIMES DAILY  . pantoprazole (PROTONIX) 40 MG tablet TAKE 1 TABLET BY MOUTH  DAILY  . predniSONE (DELTASONE) 10 MG tablet Take 4tabsx2days,2tabsx2days,1tabx2days,then stop  . timolol (BETIMOL) 0.5 % ophthalmic solution Place 1 drop into both eyes 2 (two) times daily.  . vitamin E 400 UNIT capsule Take 400 Units by mouth daily.             Objective:   Physical Exam  Amb [place] female,  Verbose/ nad  Min spont cough brought on by talking   05/03/2013      136 > 11/11/2017   > 11/11/2017     138 >  12/28/2017   139 > 04/20/2018   141 > 06/27/2018  135     04/14/13 136 lb (61.689 kg)  03/28/13 137 lb (62.143 kg)  03/24/13 138 lb (62.596 kg)       Vital signs reviewed - Note on arrival 02 sats  95% on RA  HEENT: nl dentition, turbinates bilaterally, and oropharynx. Nl external ear canals without cough reflex   NECK :  without JVD/Nodes/TM/ nl carotid upstrokes bilaterally   LUNGS: no acc muscle use,  Nl contour chest which is clear to A and P bilaterally without cough on insp or exp maneuvers   CV:  RRR  no s3 or murmur or increase in P2, and no edema   ABD:  soft and nontender with nl inspiratory excursion in the supine position. No bruits or organomegaly appreciated, bowel sounds nl  MS:  Nl gait/ ext warm without deformities, calf tenderness, cyanosis or clubbing No obvious joint restrictions   SKIN: warm and dry without lesions    NEURO:  alert, approp, nl sensorium with  no motor or cerebellar deficits apparent.              Assessment:

## 2018-06-27 NOTE — Patient Instructions (Addendum)
Bedblocks are 6 - 8 inches at [email or web address] or [employer] or mattress stores  Take chlorpheniramine 4 mg x 2 at bedtime and up to 1 every 4 hours during the day as needed  Prednisone 10 mg take  4 each am x 2 days,   2 each am x 2 days,  1 each am x 2 days and stop  Please see patient coordinator before you leave today  to schedule [facility]ins at [facility]  Please schedule a follow up office visit in 4 weeks, call sooner if needed with all medications /inhalers/ solutions in hand so we can verify exactly what you are taking. This includes all medications from all doctors and over the [place] separate them into two bags:  the ones you take automatically, no matter what, vs the ones you take just when you feel you need them "BAG #2 is UP TO YOU"  - this will really help [place] help you take your medications more effectively.

## 2018-06-28 ENCOUNTER — Other Ambulatory Visit: Payer: Self-pay | Admitting: Endocrinology

## 2018-06-28 ENCOUNTER — Encounter: Payer: Self-pay | Admitting: Internal Medicine

## 2018-06-28 NOTE — Telephone Encounter (Signed)
Unable to process refill request without an appt. Called pt to schedule appt. LVM requesting returned call.

## 2018-06-28 NOTE — Assessment & Plan Note (Addendum)
Onset 2001  - GI w/u 2004:  Mod BS nl, nl 24 h pH off all GI meds > rec d/c GERD rx  Per Dr Henrene Pastor  -PFT 2012 normal -no airflow obstruction, no restriction, moderately reduced diffusing capacity - increased neurontin to 200 tid 01/11/2013 > 300 mg tid as of 02/22/2013  -CT sinus 04/05/13 >Chronic sphenoid sinusitis. Slight nasal septal deviation right to left. -CT chest 04/05/13 >Patchy areas of paraseptal and central lobar emphysema. . Patchy ground-glass opacities suggest a partial airspace filling process and could reflect mild edema or inflammation. . Linear scarring changes in both lungs. - 08/23/2013 reporting 100% resolution on neurontin 300 tid rec taper off x one month - 11/11/2017 re-eval for cough with  flare 08/2017 since uri ? Related to air travel> rec cyclical cough regimen and increase gabapentin back to 300 tid.  - 12/28/2017 increased gabapentin to 300 mg qid and refer to Dr [facility] = next step > did not go - 04/20/2018 relapsed cough on ?  rx > rec resume gabapentin 300 mg qid and 1st gen H1 blockers per guidelines if tol then return for med reconciliation   Present flare since 08/2017 does indeed appear to improve short term with tyl #3 / pred despite previous statements "nothing works" and yet even when her cough improves still has globus sensation typical of irritable larynx syndrome.    Would suggest she this time try prednisone x 6 days with cough suppression (to isolate benefit to steroids vs opioids) and  focus on noct cough elimination first by using 1st gen H1 blockers per guidelines  = 2 at bedtime and q 4 h prn during the day instead of xyzal and proceed with ent eval by Dr Joya Gaskins for globus sensation then return here in 4 weeks with all meds in hand using a trust but verify approach to confirm accurate Medication  Reconciliation The principal here is that until we are certain that the  patients are doing what we've asked, it makes no sense to ask them to do more.     To keep things simple, I have asked the patient to first separate medicines that are perceived as maintenance, that is to be taken daily "no matter what", from those medicines that are taken on only on an as-needed basis and I have given the patient examples of both and separate in two bags that demonstrate she's on the same wavelength with me or advised cannot hope to provide her quality medical care thru this office   I had an extended discussion with the patient reviewing all relevant studies completed to date and  lasting 25 minutes of a 40  minute office visit addressing refractory non-specific but potentially very serious respiratory symptoms of uncertain and potentially multiple  etiologies.   Each maintenance medication was reviewed in detail including most importantly the difference between maintenance and prns and under what circumstances the prns are to be triggered using an action plan format that is not reflected in the computer generated alphabetically organized AVS.    Please see AVS for specific instructions unique to this office visit that I personally wrote and verbalized to the the pt in detail and then reviewed with pt  by my nurse highlighting any changes in therapy/plan of care  recommended at today's visit.

## 2018-07-04 ENCOUNTER — Telehealth: Payer: Self-pay | Admitting: Internal Medicine

## 2018-07-04 ENCOUNTER — Other Ambulatory Visit: Payer: Self-pay | Admitting: Endocrinology

## 2018-07-04 DIAGNOSIS — Z794 Long term (current) use of insulin: Secondary | ICD-10-CM

## 2018-07-04 DIAGNOSIS — E1165 Type 2 diabetes mellitus with hyperglycemia: Secondary | ICD-10-CM

## 2018-07-04 NOTE — Telephone Encounter (Signed)
Referral placed on 06/27/18.  PCC's could we please f/u.  forwarding to [facility] pool.

## 2018-07-07 ENCOUNTER — Other Ambulatory Visit: Payer: Self-pay

## 2018-07-07 ENCOUNTER — Ambulatory Visit (HOSPITAL_BASED_OUTPATIENT_CLINIC_OR_DEPARTMENT_OTHER)
Admission: RE | Admit: 2018-07-07 | Discharge: 2018-07-07 | Disposition: A | Discharge status: Home / Self Care | Payer: Medicare Other | Source: Ambulatory Visit | Attending: Adult Health | Admitting: Adult Health

## 2018-07-07 DIAGNOSIS — R059 Cough, unspecified: Secondary | ICD-10-CM

## 2018-07-07 DIAGNOSIS — R05 Cough: Secondary | ICD-10-CM | POA: Diagnosis present

## 2018-07-08 ENCOUNTER — Other Ambulatory Visit (INDEPENDENT_AMBULATORY_CARE_PROVIDER_SITE_OTHER): Payer: Medicare Other

## 2018-07-08 DIAGNOSIS — E1165 Type 2 diabetes mellitus with hyperglycemia: Secondary | ICD-10-CM | POA: Diagnosis not present

## 2018-07-08 DIAGNOSIS — Z794 Long term (current) use of insulin: Secondary | ICD-10-CM | POA: Diagnosis not present

## 2018-07-08 LAB — COMPREHENSIVE METABOLIC PANEL
ALT: 15 U/L (ref 0–35)
AST: 19 U/L (ref 0–37)
Albumin: 4.1 g/dL (ref 3.5–5.2)
Alkaline Phosphatase: 46 U/L (ref 39–117)
BUN: 10 mg/dL (ref 6–23)
CO2: 28 mEq/L (ref 19–32)
Calcium: 8.8 mg/dL (ref 8.4–10.5)
Chloride: 99 mEq/L (ref 96–112)
Creatinine, Ser: 0.68 mg/dL (ref 0.40–1.20)
GFR: 82.89 mL/min (ref >60.00)
Glucose, Bld: 165 mg/dL — ABNORMAL HIGH (ref 70–99)
Potassium: 3.9 mEq/L (ref 3.5–5.1)
Sodium: 138 mEq/L (ref 135–145)
Total Bilirubin: 1.1 mg/dL (ref 0.2–1.2)
Total Protein: 6.7 g/dL (ref 6.0–8.3)

## 2018-07-08 LAB — URINALYSIS, ROUTINE W REFLEX MICROSCOPIC
Bilirubin Urine: NEGATIVE
Hgb urine dipstick: NEGATIVE
Ketones, ur: NEGATIVE
Leukocytes,Ua: NEGATIVE
Nitrite: NEGATIVE
RBC / HPF: NONE SEEN (ref 0–?)
Specific Gravity, Urine: 1.015 (ref 1.000–1.030)
Total Protein, Urine: NEGATIVE
Urine Glucose: 500 — AB
Urobilinogen, UA: 0.2 (ref 0.0–1.0)
pH: 8.5 — AB (ref 5.0–8.0)

## 2018-07-08 LAB — HEMOGLOBIN A1C: Hgb A1c MFr Bld: 8.5 % — ABNORMAL HIGH (ref 4.6–6.5)

## 2018-07-08 LAB — MICROALBUMIN / CREATININE URINE RATIO
Creatinine,U: 65.7 mg/dL
Microalb Creat Ratio: 5.8 mg/g (ref 0.0–30.0)
Microalb, Ur: 3.8 mg/dL — ABNORMAL HIGH (ref 0.0–1.9)

## 2018-07-11 ENCOUNTER — Encounter: Payer: Self-pay | Admitting: Endocrinology

## 2018-07-11 ENCOUNTER — Other Ambulatory Visit: Payer: Self-pay

## 2018-07-11 ENCOUNTER — Ambulatory Visit (INDEPENDENT_AMBULATORY_CARE_PROVIDER_SITE_OTHER): Payer: Medicare Other | Admitting: Endocrinology

## 2018-07-11 VITALS — BP 108/78 | HR 100 | Temp 97.9°F | Wt 137.6 lb

## 2018-07-11 DIAGNOSIS — E1165 Type 2 diabetes mellitus with hyperglycemia: Secondary | ICD-10-CM | POA: Diagnosis not present

## 2018-07-11 DIAGNOSIS — Z794 Long term (current) use of insulin: Secondary | ICD-10-CM | POA: Diagnosis not present

## 2018-07-11 LAB — POCT GLYCOSYLATED HEMOGLOBIN (HGB A1C): Hemoglobin A1C: 8 % — AB (ref 4.0–5.6)

## 2018-07-11 NOTE — Patient Instructions (Addendum)
Take 16 Levemir until am sugar is < 120  Humalog None if no ca  Check sugar and BP when feeling tired and shaky: reduce if sugar getting low  Check sugar 3x daily

## 2018-07-11 NOTE — Progress Notes (Signed)
Patient ID: Mary Flynn, female   DOB: [birth year], 84 y.o.   MRN: [id number]   Reason for Appointment: Diabetes and general follow-up   History of Present Illness   Diagnosis: Type 2 DIABETES MELITUS, date of diagnosis:   1995    Previous history: She has been on various regimens of hypoglycemic drugs over the last several years including Actos, Amaryl and Januvia. Because of cost her Januvia was stopped and Actos was stopped because of potential weight gain She was initially started on Levemir insulin because of fasting hyperglycemia in 2009 and the dose has been adjusted periodically In the last year she is also quite mealtime coverage because of postprandial hyperglycemia despite usually eating a low carbohydrate diet   Recent history:   Insulin regimen: Levemir 14 units hs;  Humalog 9 at breakfast, 9 at lunch and 0-5 acs Oral hypoglycemic drugs: Amaryl 2 mg twice a day, metformin, Invokana 126m in am  Her A1c is recently 8.5  She has not been seen in follow-up since 03/2017  Current blood sugar patterns and problems identified:  She has been taking prednisone over the last 6 weeks or so from her pulmonologist for cough  With this she tends to have high blood sugars periodically  However she did not report this until recently and also did not adjust her insulin dose  As before she is checking blood sugars only FASTING  These are variably high  She thinks she is continue to take Invokana regularly with her basal bolus insulin regimen  Her last dose of prednisone was on 5/10 but is still having high blood sugars fasting, it was over 200 on Saturday  However she still thinks that periodically she will have symptoms of low blood sugars which she calls a blackout where she will feel tired, little shaky and may get better with juice.  The symptoms will last for an hour and can occur at anytime of the day.  She never checks her blood sugars at those episodes  MEALTIME  insulin: She continues to take 9 units of Humalog with breakfast and lunch  However at times will not eat any food in the morning and only drink a small amount of juice  She does have some carbohydrate with her evening meal such as pasta or cereal but will forget to take her evening Humalog insulin at that time  Hypoglycemia :  none documented but she feels some weakness when blood sugars are in the 70s and 80s    Side effects from medications: None.                Monitors blood glucose: Once  a day.    Glucometer:  One Touch ultra 2        Blood Glucose readings from meter download:   Occasionally may have 2 blood sugars done around the same time  PRE-MEAL Fasting Lunch Dinner Bedtime Overall  Glucose range:  109-232      Mean/median:  185         Previous readings:  PRE-MEAL Fasting Lunch Dinner Bedtime Overall  Glucose range: 100-152  73-128  91 99-346    Mean/median: 118    200  118   POST-MEAL PC Breakfast PC Lunch PC Dinner  Glucose range: ?  73-128     Mean/median:        Meals: 3 meals per day she is a vegetarian. Trying to get some protein at most meals including dairy products and  tofu  Has breakfast at 10 am,  lunch around 2 PM, dinner at 8 PM       Physical activity: exercise:   Walking up to 2 miles when she is feeling good     Last visit with dietitian: 2009   Wt Readings from Last 3 Encounters:  07/11/18 137 lb 9.6 oz (62.4 kg)  06/27/18 135 lb (61.2 kg)  05/06/18 140 lb 9.6 oz (63.8 kg)    LABS:  Lab Results  Component Value Date   HGBA1C 8.0 (A) 07/11/2018   HGBA1C 8.5 (H) 07/08/2018   HGBA1C 7.9 (H) 10/29/2017   Lab Results  Component Value Date   MICROALBUR 3.8 (H) 07/08/2018   [place] 42 12/21/2017   CREATININE 0.68 07/08/2018    Other issues discussed today: See review of systems   Allergies as of 07/11/2018      Reactions   Ace Inhibitors Cough   Losartan Cough   Penicillins Hives   Sulfa Antibiotics Hives   Sulfonamide  Derivatives       Medication List       Accurate as of Jul 11, 2018 10:51 AM. If you have any questions, ask your nurse or doctor.        amLODipine 10 MG tablet Commonly known as:  NORVASC Take 1 tablet (10 mg total) by mouth daily.   aspirin EC 81 MG tablet Take 81 mg by mouth daily.   atorvastatin 10 MG tablet Commonly known as:  LIPITOR TAKE 1 TABLET BY MOUTH  EVERY DAY   b complex vitamins tablet Take 1 tablet by mouth daily.   calcium citrate-vitamin D 315-200 MG-UNIT tablet Commonly known as:  CITRACAL+D Take 1 tablet by mouth 2 (two) times daily.   Chlor-Trimeton 4 MG tablet Generic drug:  chlorpheniramine Take 4 mg by mouth every 4 (four) hours as needed for allergies.   cyclobenzaprine 10 MG tablet Commonly known as:  FLEXERIL TAKE 1 TABLET BY MOUTH TWO  TIMES DAILY AS NEEDED FOR  MUSCLE SPASM   Delsym 30 MG/5ML liquid Generic drug:  dextromethorphan 2 tsp twice daily as needed for cough   DULoxetine 30 MG capsule Commonly known as:  CYMBALTA TAKE 1 CAPSULE BY MOUTH  DAILY   famotidine 20 MG tablet Commonly known as:  Pepcid Take 1 tablet (20 mg total) by mouth at bedtime.   fluconazole 150 MG tablet Commonly known as:  DIFLUCAN Take 1 tab by mouth once as needed for yeast infection   fluticasone 50 MCG/ACT nasal spray Commonly known as:  FLONASE Place 2 sprays into both nostrils daily.   gabapentin 300 MG capsule Commonly known as:  NEURONTIN Take 1 capsule (300 mg total) by mouth 4 (four) times daily.   glimepiride 4 MG tablet Commonly known as:  AMARYL TAKE 1 TABLET BY MOUTH  TWICE A DAY   glucose blood test strip Commonly known as:  ONE TOUCH ULTRA TEST Use to test blood sugar 2 times daily as instructed. Dx code: E11.49   ONE TOUCH ULTRA TEST test strip Generic drug:  glucose blood TEST TWO TIMES DAILY   glucose blood test strip Commonly known as:  ONE TOUCH ULTRA TEST USE TO TEST BLOOD SUGARS  TWO TIMES DAILY   HAIR SKIN AND  NAILS FORMULA PO Take by mouth daily.   HumaLOG KwikPen 100 UNIT/ML KiwkPen Generic drug:  insulin lispro 9 units with breakfast and lunch, 4 units at evening meal   Insulin Pen Needle 32G X 4 MM  Misc Use 3 per day   Invokana 100 MG Tabs tablet Generic drug:  canagliflozin Take 100 mg by mouth daily before breakfast.   latanoprost 0.005 % ophthalmic solution Commonly known as:  XALATAN Place 1 drop into both eyes at bedtime.   Levemir FlexPen 100 UNIT/ML Pen Generic drug:  Insulin Detemir Inject 14 Units into the skin at bedtime.   levocetirizine 5 MG tablet Commonly known as:  XYZAL TAKE 1 TABLET BY MOUTH  EVERY EVENING   levothyroxine 25 MCG tablet Commonly known as:  SYNTHROID TAKE 1 TABLET BY MOUTH  DAILY BEFORE BREAKFAST   Melatonin 5 MG Tabs Take 1 tablet by mouth at bedtime as needed.   metFORMIN 1000 MG tablet Commonly known as:  GLUCOPHAGE TAKE 1 TABLET BY MOUTH TWO  TIMES DAILY WITH MEALS   metoprolol succinate 100 MG 24 hr tablet Commonly known as:  TOPROL-XL TAKE 1 TABLET BY MOUTH  DAILY WITH OR IMMEDIATELY  FOLLOWING A MEAL   mirtazapine 7.5 MG tablet Commonly known as:  REMERON TAKE 1 TABLET BY MOUTH AT  BEDTIME   montelukast 10 MG tablet Commonly known as:  SINGULAIR TAKE 1 TABLET BY MOUTH AT  BEDTIME   niacin 500 MG CR tablet Commonly known as:  NIASPAN Take 2 tablets (1,000 mg total) by mouth at bedtime.   NONFORMULARY OR COMPOUNDED ITEM Lumbar support belt  #1  As directed   nystatin ointment Commonly known as:  MYCOSTATIN Apply 1 application topically 2 (two) times daily as needed.   ONE TOUCH ULTRA 2 w/Device Kit USE AS DIRECTED TO TEST  BLOOD SUGAR TWO TIMES DAILY   pantoprazole 40 MG tablet Commonly known as:  PROTONIX TAKE 1 TABLET BY MOUTH  DAILY   predniSONE 10 MG tablet Commonly known as:  DELTASONE Take  4 each am x 2 days,   2 each am x 2 days,  1 each am x 2 days and stop   timolol 0.5 % ophthalmic solution Commonly  known as:  BETIMOL Place 1 drop into both eyes 2 (two) times daily.   vitamin E 400 UNIT capsule Take 400 Units by mouth daily.       Allergies:  Allergies  Allergen Reactions  . Ace Inhibitors Cough  . Losartan Cough  . Penicillins Hives  . Sulfa Antibiotics Hives  . Sulfonamide Derivatives     Past Medical History:  Diagnosis Date  . Diabetes mellitus   . Hyperlipidemia   . Hypertension   . Left rib fracture 02/18/2014    Past Surgical History:  Procedure Laterality Date  . APPENDECTOMY    . REFRACTIVE SURGERY     Left Eye  . surgical sterilization  40 years ago    Family History  Problem Relation Age of Onset  . Diabetes Mother   . Hypertension Father   . Depression Other   . Cancer Other        laryngeal  . Diabetes Other     Social History:  reports that she has never smoked. She has never used smokeless tobacco. She reports that she does not drink alcohol or use drugs.  Review of Systems:   HYPOTHYROIDISM:  She is has been mild and not clear if she had symptoms except mild hair loss Has been started on treatment by PCP and she feels fairly good now with no unusual fatigue or hair loss  Continues to be on 25 g of levothyroxine TSH last year was normal  Lab Results  Component Value Date   TSH 2.33 10/29/2017   TSH 4.33 04/05/2017   TSH 2.28 02/04/2017   FREET4 1.06 06/19/2016   FREET4 1.12 06/10/2016   FREET4 0.93 10/17/2014     Hypertension:    Controlled with valsartan, blood pressure is followed by PCP  BP Readings from Last 3 Encounters:  07/11/18 108/78  06/27/18 110/72  05/06/18 112/68     Lab Results  Component Value Date   CREATININE 0.68 07/08/2018   BUN 10 07/08/2018   NA 138 07/08/2018   K 3.9 07/08/2018   CL 99 07/08/2018   CO2 28 07/08/2018    Lipids: Has had diabetic dyslipidemia,  well controlled and followed by her PCP   Lab Results  Component Value Date   CHOL 113 12/21/2017   HDL 50.00 12/21/2017    LDLCALC 42 12/21/2017   TRIG 108.0 12/21/2017   CHOLHDL 2 12/21/2017     Diabetic foot exam done in 01/2017  Diabetic foot exam showed normal monofilament sensation in the toes and plantar surfaces, no skin lesions or ulcers on the feet and normal pedal pulses  Last eye exam documented on 02/26/2017     Examination:   BP 108/78 (BP Location: Left Arm, Patient Position: Sitting, Cuff Size: Normal)   Pulse 100   Temp 97.9 F (36.6 C) (Oral)   Wt 137 lb 9.6 oz (62.4 kg)   SpO2 94%   BMI 28.76 kg/m   Body mass index is 28.76 kg/m.     ASSESSMENT/ PLAN:   Diabetes type 2  With BMI 29  See history of present illness for detailed discussion of  current management, blood sugar patterns and problems identified   She is on basal bolus insulin regimen with Levemir at bedtime Also on Invokana, Amaryl and metformin  Her A1c is significantly higher Some of her hypoglycemia is related to repeated doses of prednisone in the last 2 months  As discussed above she is only checking readings in the mornings Also her blood sugar appears to be persistently high despite not having any prednisone for the last 7-8 days  She also claimed that she gets low blood sugar episodes This may be likely related to her periodically not eating much and still taking 9 units of Humalog This appears to be a relatively large dose compared to her basal insulin dose   Recommendations:   She was advised to check her blood sugars 3 times a day regularly at the minimum  She is asking about the freestyle libre but will not be eligible unless she is documenting testing 4 times a day  Discussed that she does not need to take her HUMALOG at breakfast she is not eating any significant amount of carbohydrate  However does need to take this consistently at dinnertime when she is eating some carbohydrate  Since her fasting readings appear to be persistently high she can go up currently 16 on the Levemir  No  change in metformin, Amaryl or Invokana  Emphasized the need to check her blood sugar anytime she has a weak feeling to document hypoglycemia  Needs more regular follow-up and will review her level of control in 1 month  HYPOTHYROIDISM: Needs follow-up TSH  HYPERTENSION: This is recently controlled  Counseling time on subjects discussed in assessment and plan sections is over 50% of today's 25 minute visit  There are no Patient Instructions on file for this visit.      Elayne Snare 07/11/2018, 10:51 AM

## 2018-07-12 ENCOUNTER — Telehealth: Payer: Self-pay | Admitting: Internal Medicine

## 2018-07-12 NOTE — Telephone Encounter (Signed)
Pt calling inquiring ENT referral to Dr.Wright. Explained referral was placed 06/27/18 Gave patient contact info [phone] to contact office [id number] new consult. Made aware during Coronavirus each office may be operating differently. Pt verbalized understanding Nothing further needed.

## 2018-07-14 ENCOUNTER — Telehealth: Payer: Self-pay | Admitting: Endocrinology

## 2018-07-14 ENCOUNTER — Other Ambulatory Visit: Payer: Self-pay

## 2018-07-14 MED ORDER — GLUCOSE BLOOD VI STRP: ORAL_STRIP | 2 refills | Status: DC

## 2018-07-14 NOTE — Telephone Encounter (Signed)
MEDICATION: One touch Ultra Test Strips  PHARMACY:  [facility]  IS THIS A 90 DAY SUPPLY : yes  IS PATIENT OUT OF MEDICATION: yes  IF NOT; HOW MUCH IS LEFT:   LAST APPOINTMENT DATE: @5 /18/2020  NEXT APPOINTMENT DATE:@6 /15/2020  DO WE HAVE YOUR PERMISSION TO LEAVE A DETAILED MESSAGE:   YES [phone]  OTHER COMMENTS:    **Let patient know to contact pharmacy at the end of the day to make sure medication is ready. **  ** Please notify patient to allow 48-72 hours to process**  **Encourage patient to contact the pharmacy for refills or they can request refills through [facility]**

## 2018-07-14 NOTE — Telephone Encounter (Signed)
Rx sent 

## 2018-07-15 ENCOUNTER — Telehealth: Payer: Self-pay | Admitting: Internal Medicine

## 2018-07-15 ENCOUNTER — Telehealth: Payer: Self-pay | Admitting: Family

## 2018-07-15 DIAGNOSIS — I2510 Atherosclerotic heart disease of native coronary artery without angina pectoris: Secondary | ICD-10-CM

## 2018-07-15 NOTE — Telephone Encounter (Signed)
Provider's comments given to patient, advised she will get a phone call with the referral

## 2018-07-15 NOTE — Telephone Encounter (Signed)
Call returned to patient, requesting results of her CT.   Notes recorded by Melvenia Needles, NP on 07/12/2018 at 3:53 PM EDT No ILD .  Stable scarring .  No change in lymph nodes.   CAD noted. Discuss with PCP if further cardiac testing is indicated.   Cont w/ ov recs Follow up as planned and As needed   Will discuss in detail on return  Please contact office for sooner follow up if symptoms do not improve or worsen or seek emergency care   Results given per TP. Voiced understanding. Nothing further is needed at this time.

## 2018-07-15 NOTE — Telephone Encounter (Signed)
Please contact pt and let her know that I reviewed her CT chest from lung doctor.  There was note of some calcifications of her heart arteries. I would like her to meet with cardiology for consult and they may wish to do further testing such as a stress test. I have placed the referral.

## 2018-07-19 ENCOUNTER — Ambulatory Visit: Payer: Medicare Other | Admitting: Internal Medicine

## 2018-07-20 ENCOUNTER — Telehealth: Payer: Self-pay | Admitting: Endocrinology

## 2018-07-20 NOTE — Telephone Encounter (Signed)
Labs printed and mailed to pt.  °

## 2018-07-20 NOTE — Telephone Encounter (Signed)
Patient is requesting to have her lab results mailed to her.  Please Advise, Thanks

## 2018-07-21 ENCOUNTER — Telehealth: Payer: Self-pay | Admitting: Cardiology

## 2018-07-21 NOTE — Telephone Encounter (Signed)
Virtual Visit Pre-Appointment Phone Call  "(Name), I am calling you today to discuss your upcoming appointment. We are currently trying to limit exposure to the virus that causes COVID-19 by seeing patients at home rather than in the office."  1. "What is the BEST phone number to call the day of the visit?" - include this in appointment notes  2. Do you have or have access to (through a family member/friend) a smartphone with video capability that we can use for your visit?" a. If yes - list this number in appt notes as cell (if different from BEST phone #) and list the appointment type as a VIDEO visit in appointment notes b. If no - list the appointment type as a PHONE visit in appointment notes  3. Confirm consent - "In the setting of the current Covid19 crisis, you are scheduled for a (phone or video) visit with your provider on (date) at (time).  Just as we do with many in-office visits, in order for you to participate in this visit, we must obtain consent.  If you'd like, I can send this to your mychart (if signed up) or email for you to review.  Otherwise, I can obtain your verbal consent now.  All virtual visits are billed to your insurance company just like a normal visit would be.  By agreeing to a virtual visit, we'd like you to understand that the technology does not allow for your provider to perform an examination, and thus may limit your provider's ability to fully assess your condition. If your provider identifies any concerns that need to be evaluated in person, we will make arrangements to do so.  Finally, though the technology is pretty good, we cannot assure that it will always work on either your or our end, and in the setting of a video visit, we may have to convert it to a phone-only visit.  In either situation, we cannot ensure that we have a secure connection.  Are you willing to proceed?" STAFF: Did the patient verbally acknowledge consent to telehealth visit? Document  YES/NO here: YES  4. Advise patient to be prepared - "Two hours prior to your appointment, go ahead and check your blood pressure, pulse, oxygen saturation, and your weight (if you have the equipment to check those) and write them all down. When your visit starts, your provider will ask you for this information. If you have an Apple Watch or Kardia device, please plan to have heart rate information ready on the day of your appointment. Please have a pen and paper handy nearby the day of the visit as well."  5. Give patient instructions for MyChart download to smartphone OR Doximity/Doxy.me as below if video visit (depending on what platform provider is using)  6. Inform patient they will receive a phone call 15 minutes prior to their appointment time (may be from unknown caller ID) so they should be prepared to answer    [facility] has been deemed a candidate for a follow-up tele-health visit to limit community exposure during the Covid-19 pandemic. I spoke with the patient via phone to ensure availability of phone/video source, confirm preferred email & phone number, and discuss instructions and expectations.  I reminded Mary Flynn to be prepared with any vital sign and/or heart rhythm information that could potentially be obtained via home monitoring, at the time of her visit. I reminded Mary Flynn to expect a phone call prior to  her visit.  Frederic Jericho 07/21/2018 10:01 AM   INSTRUCTIONS FOR DOWNLOADING THE MYCHART APP TO SMARTPHONE  - The patient must first make sure to have activated MyChart and know their login information - If Apple, go to [employer] and type in MyChart in the search bar and download the app. If Android, ask patient to go to [employer] and type in [facility] in the search bar and download the app. The app is free but as with any other app downloads, their phone may require them to verify saved payment information or Apple/Android password.  -  The patient will need to then log into the app with their MyChart username and password, and select [facility] as their healthcare provider to link the account. When it is time for your visit, go to the MyChart app, find appointments, and click Begin Video Visit. Be sure to Select Allow for your device to access the Microphone and Camera for your visit. You will then be connected, and your provider will be with you shortly.  **If they have any issues connecting, or need assistance please contact MyChart service desk [phone])**  **If using a computer, in order to ensure the best quality for their visit they will need to use either of the following Internet Browsers: [employer], or Google Chrome**  IF USING DOXIMITY or DOXY.ME - The patient will receive a link just prior to their visit by text.     FULL LENGTH CONSENT FOR TELE-HEALTH VISIT   I hereby voluntarily request, consent and authorize [facility] and its employed or contracted physicians, physician assistants, nurse practitioners or other licensed health care professionals (the Practitioner), to provide me with telemedicine health care services (the Services") as deemed necessary by the treating Practitioner. I acknowledge and consent to receive the Services by the Practitioner via telemedicine. I understand that the telemedicine visit will involve communicating with the Practitioner through live audiovisual communication technology and the disclosure of certain medical information by electronic transmission. I acknowledge that I have been given the opportunity to request an in-person assessment or other available alternative prior to the telemedicine visit and am voluntarily participating in the telemedicine visit.  I understand that I have the right to withhold or withdraw my consent to the use of telemedicine in the course of my care at any time, without affecting my right to future care or treatment, and that the  Practitioner or I may terminate the telemedicine visit at any time. I understand that I have the right to inspect all information obtained and/or recorded in the course of the telemedicine visit and may receive copies of available information for a reasonable fee.  I understand that some of the potential risks of receiving the Services via telemedicine include:   Delay or interruption in medical evaluation due to technological equipment failure or disruption;  Information transmitted may not be sufficient (e.g. poor resolution of images) to allow for appropriate medical decision making by the Practitioner; and/or   In rare instances, security protocols could fail, causing a breach of personal health information.  Furthermore, I acknowledge that it is my responsibility to provide information about my medical history, conditions and care that is complete and accurate to the best of my ability. I acknowledge that Practitioner's advice, recommendations, and/or decision may be based on factors not within their control, such as incomplete or inaccurate data provided by me or distortions of diagnostic images or specimens that may result from electronic transmissions. I  understand that the practice of medicine is not an exact science and that Practitioner makes no warranties or guarantees regarding treatment outcomes. I acknowledge that I will receive a copy of this consent concurrently upon execution via email to the email address I last provided but may also request a printed copy by calling the office of [facility].    I understand that my insurance will be billed for this visit.   I have read or had this consent read to me.  I understand the contents of this consent, which adequately explains the benefits and risks of the Services being provided via telemedicine.   I have been provided ample opportunity to ask questions regarding this consent and the Services and have had my questions answered to my  satisfaction.  I give my informed consent for the services to be provided through the use of telemedicine in my medical care  By participating in this telemedicine visit I agree to the above.

## 2018-07-22 ENCOUNTER — Telehealth (INDEPENDENT_AMBULATORY_CARE_PROVIDER_SITE_OTHER): Payer: Medicare Other | Admitting: Cardiology

## 2018-07-22 ENCOUNTER — Encounter: Payer: Self-pay | Admitting: Cardiology

## 2018-07-22 ENCOUNTER — Telehealth: Payer: Self-pay | Admitting: Family

## 2018-07-22 ENCOUNTER — Other Ambulatory Visit: Payer: Self-pay

## 2018-07-22 VITALS — BP 137/69 | HR 98 | Ht <= 58 in | Wt 135.0 lb

## 2018-07-22 DIAGNOSIS — I10 Essential (primary) hypertension: Secondary | ICD-10-CM

## 2018-07-22 DIAGNOSIS — E1151 Type 2 diabetes mellitus with diabetic peripheral angiopathy without gangrene: Secondary | ICD-10-CM

## 2018-07-22 DIAGNOSIS — E119 Type 2 diabetes mellitus without complications: Secondary | ICD-10-CM

## 2018-07-22 DIAGNOSIS — I2510 Atherosclerotic heart disease of native coronary artery without angina pectoris: Secondary | ICD-10-CM

## 2018-07-22 DIAGNOSIS — IMO0002 Reserved for concepts with insufficient information to code with codable children: Secondary | ICD-10-CM

## 2018-07-22 DIAGNOSIS — E785 Hyperlipidemia, unspecified: Secondary | ICD-10-CM | POA: Diagnosis not present

## 2018-07-22 MED ORDER — TRIAMCINOLONE ACETONIDE 0.1 % MT PSTE: 1.0000 "application " | PASTE | Freq: Two times a day (BID) | OROMUCOSAL | 1 refills | Status: DC

## 2018-07-22 NOTE — Patient Instructions (Signed)

## 2018-07-22 NOTE — Telephone Encounter (Signed)
Left message on answering machine that rx has been sent to [facility].

## 2018-07-22 NOTE — Telephone Encounter (Signed)
Please advise 

## 2018-07-22 NOTE — Progress Notes (Signed)
Virtual Visit via Telephone Note   This visit type was conducted due to national recommendations for restrictions regarding the COVID-19 Pandemic (e.g. social distancing) in an effort to limit this patient's exposure and mitigate transmission in our community.  Due to her co-morbid illnesses, this patient is at least at moderate risk for complications without adequate follow up.  This format is felt to be most appropriate for this patient at this time.  The patient did not have access to video technology/had technical difficulties with video requiring transitioning to audio format only (telephone).  All issues noted in this document were discussed and addressed.  No physical exam could be performed with this format.  Please refer to the patient's chart for her  consent to telehealth for [facility].   Date:  07/22/2018   ID:  JURNIE GARRITANO, DOB [birth year], MRN [id number]  Patient Location: Home Provider Location: Office  PCP:  [name], NP  Cardiologist:  No primary care provider on file.  Electrophysiologist:  None   Evaluation Performed:  New Patient Evaluation  Chief Complaint: Coronary artery disease diagnosed by CT scan  History of Present Illness:    Garima Chronis Carlini is a 84 y.o. female with past medical history of essential hypertension diabetes mellitus and dyslipidemia.  She was referred to me because of a CT scan revealing calcifications.  Patient denies any problems at this time and takes care of activities of daily living.  No chest pain orthopnea or PND.  She is an active lady and tells me that before the viral pandemic she walked 3 miles a day without any problems.  Now she walks about a mile without any issues.  No chest pain orthopnea or PND.  At the time of my evaluation, the patient is alert awake oriented and in no distress.  The patient does not have symptoms concerning for COVID-19 infection (fever, chills, cough, or new shortness of breath).    Past Medical  History:  Diagnosis Date  . Diabetes mellitus   . Hyperlipidemia   . Hypertension   . Left rib fracture 02/18/2014   Past Surgical History:  Procedure Laterality Date  . APPENDECTOMY    . REFRACTIVE SURGERY     Left Eye  . surgical sterilization  40 years ago     Current Meds  Medication Sig  . acetaminophen-codeine 120-12 MG/5ML solution Take 2.5 mLs by mouth every 4 (four) hours as needed for moderate pain.  Marland Kitchen amLODipine (NORVASC) 10 MG tablet Take 1 tablet (10 mg total) by mouth daily.  Marland Kitchen aspirin EC 81 MG tablet Take 81 mg by mouth daily.  Marland Kitchen atorvastatin (LIPITOR) 10 MG tablet TAKE 1 TABLET BY MOUTH  EVERY DAY  . b complex vitamins tablet Take 1 tablet by mouth daily.  . Blood Glucose Monitoring Suppl (ONE TOUCH ULTRA 2) w/Device KIT USE AS DIRECTED TO TEST  BLOOD SUGAR TWO TIMES DAILY  . calcium citrate-vitamin D (CITRACAL+D) 315-200 MG-UNIT tablet Take 1 tablet by mouth 2 (two) times daily.  . canagliflozin (INVOKANA) 100 MG TABS tablet Take 100 mg by mouth daily before breakfast.  . chlorpheniramine (CHLOR-TRIMETON) 4 MG tablet Take 4 mg by mouth every 4 (four) hours as needed for allergies.  . cyclobenzaprine (FLEXERIL) 10 MG tablet TAKE 1 TABLET BY MOUTH TWO  TIMES DAILY AS NEEDED FOR  MUSCLE SPASM  . DULoxetine (CYMBALTA) 30 MG capsule TAKE 1 CAPSULE BY MOUTH  DAILY  . famotidine (PEPCID) 20 MG tablet Take 1  tablet (20 mg total) by mouth at bedtime.  . fluconazole (DIFLUCAN) 150 MG tablet Take 1 tab by mouth once as needed for yeast infection  . fluticasone (FLONASE) 50 MCG/ACT nasal spray Place 2 sprays into both nostrils daily.  Marland Kitchen gabapentin (NEURONTIN) 300 MG capsule Take 1 capsule (300 mg total) by mouth 4 (four) times daily.  Marland Kitchen glimepiride (AMARYL) 4 MG tablet TAKE 1 TABLET BY MOUTH  TWICE A DAY  . glucose blood (ONE TOUCH ULTRA TEST) test strip USE TO TEST BLOOD SUGARS  TWO TIMES DAILY  . Insulin Detemir (LEVEMIR FLEXPEN) 100 UNIT/ML Pen Inject 14 Units into the skin  at bedtime.   . insulin lispro (HUMALOG KWIKPEN) 100 UNIT/ML KiwkPen 9 units with breakfast and lunch, 4 units at evening meal  . Insulin Pen Needle 32G X 4 MM MISC Use 3 per day  . latanoprost (XALATAN) 0.005 % ophthalmic solution Place 1 drop into both eyes at bedtime.  Marland Kitchen levocetirizine (XYZAL) 5 MG tablet TAKE 1 TABLET BY MOUTH  EVERY EVENING  . levothyroxine (SYNTHROID, LEVOTHROID) 25 MCG tablet TAKE 1 TABLET BY MOUTH  DAILY BEFORE BREAKFAST  . Melatonin 5 MG TABS Take 1 tablet by mouth at bedtime as needed.  . metFORMIN (GLUCOPHAGE) 1000 MG tablet TAKE 1 TABLET BY MOUTH TWO  TIMES DAILY WITH MEALS  . metoprolol succinate (TOPROL-XL) 100 MG 24 hr tablet TAKE 1 TABLET BY MOUTH  DAILY WITH OR IMMEDIATELY  FOLLOWING A MEAL  . mirtazapine (REMERON) 7.5 MG tablet TAKE 1 TABLET BY MOUTH AT  BEDTIME (Patient taking differently: Take 7.5 mg by mouth as needed. )  . montelukast (SINGULAIR) 10 MG tablet TAKE 1 TABLET BY MOUTH AT  BEDTIME  . Multiple Vitamins-Minerals (HAIR SKIN AND NAILS FORMULA PO) Take by mouth daily.  . niacin (NIASPAN) 500 MG CR tablet Take 2 tablets (1,000 mg total) by mouth at bedtime.  . NONFORMULARY OR COMPOUNDED ITEM Lumbar support belt  #1  As directed  . nystatin ointment (MYCOSTATIN) Apply 1 application topically 2 (two) times daily as needed.  . pantoprazole (PROTONIX) 40 MG tablet TAKE 1 TABLET BY MOUTH  DAILY  . timolol (BETIMOL) 0.5 % ophthalmic solution Place 1 drop into both eyes 2 (two) times daily.  . vitamin E 400 UNIT capsule Take 400 Units by mouth daily.  . [DISCONTINUED] glucose blood (ONE TOUCH ULTRA TEST) test strip Use to test blood sugar 2 times daily as instructed. Dx code: E11.49     Allergies:   Ace inhibitors; Losartan; Penicillins; Sulfa antibiotics; and Sulfonamide derivatives   Social History   Tobacco Use  . Smoking status: Never Smoker  . Smokeless tobacco: Never Used  Substance Use Topics  . Alcohol use: No  . Drug use: No     Family  Hx: The patient's family history includes Cancer in an other family member; Depression in an other family member; Diabetes in her mother and another family member; Hypertension in her father.  ROS:   Please see the history of present illness.    As mentioned above All other systems reviewed and are negative.   Prior CV studies:   The following studies were reviewed today:  I reviewed CT scan report at extensive length  Labs/Other Tests and Data Reviewed:    EKG: No recent EKG.  We will obtain one in the next visit.  Recent Labs: 10/29/2017: TSH 2.33 07/08/2018: ALT 15; BUN 10; Creatinine, Ser 0.68; Potassium 3.9; Sodium 138   Recent Lipid  Panel Lab Results  Component Value Date/Time   CHOL 113 12/21/2017 07:47 AM   TRIG 108.0 12/21/2017 07:47 AM   HDL 50.00 12/21/2017 07:47 AM   CHOLHDL 2 12/21/2017 07:47 AM   LDLCALC 42 12/21/2017 07:47 AM    Wt Readings from Last 3 Encounters:  07/22/18 135 lb (61.2 kg)  07/11/18 137 lb 9.6 oz (62.4 kg)  06/27/18 135 lb (61.2 kg)     Objective:    Vital Signs:  BP 137/69 (BP Location: Left Arm, Patient Position: Sitting, Cuff Size: Normal)   Pulse 98   Ht _0  (1.473 m)   Wt 135 lb (61.2 kg)   BMI 28.22 kg/m    VITAL SIGNS:  reviewed  ASSESSMENT & PLAN:    1. Coronary artery disease: Secondary prevention stressed with the patient.  Importance of compliance with diet and medication stressed and she vocalized understanding.  She has an excellent effort tolerance and she will continue walking on a regular basis.  She was advised to continue taking coated aspirin 81 mg daily. 2. Essential hypertension: Blood pressure stable 3. Mixed dyslipidemia: She is on statin therapy and her lipids are appropriate 4. Diabetes mellitus: Diet was discussed.  This is managed by her endocrinologist. 5. I would like to get a echocardiogram done on her but she wants to wait till the viral pandemic subsides.  We will see her in follow-up appointment  in 3 months or earlier if she has any concerns.  At that time we will assess her with an echocardiogram.  I do not see a reason for a stress test at this time because of her excellent exercise tolerance.  COVID-19 Education: The signs and symptoms of COVID-19 were discussed with the patient and how to seek care for testing (follow up with PCP or arrange E-visit).  The importance of social distancing was discussed today.  Time:   Today, I have spent 25 minutes with the patient with telehealth technology discussing the above problems.     Medication Adjustments/Labs and Tests Ordered: Current medicines are reviewed at length with the patient today.  Concerns regarding medicines are outlined above.   Tests Ordered: No orders of the defined types were placed in this encounter.   Medication Changes: No orders of the defined types were placed in this encounter.   Disposition:  Follow up in 3 month(s)  Signed, Jenean Lindau, MD  07/22/2018 11:22 AM    [facility]

## 2018-07-22 NOTE — Telephone Encounter (Signed)
Copied from [facility] [id number]. Topic: Quick Communication - Rx Refill/Question >> Jul 22, 2018  3:12 PM [name], [place] wrote: **Patient calling and states that [name] has prescribed this before (not seen on medication list) and it is used for canker sores. States that her mouth is full of them and it hurts to drink water. Please advise. **  Medication: Kenalog mouth paste   Has the patient contacted their pharmacy? yes (Agent: If no, request that the patient contact the pharmacy for the refill.) (Agent: If yes, when and what did the pharmacy advise?)  Preferred Pharmacy (with phone number or street name): [facility]  Agent: Please be advised that RX refills may take up to 3 business days. We ask that you follow-up with your pharmacy.

## 2018-07-28 ENCOUNTER — Other Ambulatory Visit: Payer: Self-pay | Admitting: Family

## 2018-07-28 MED ORDER — TRIAMCINOLONE ACETONIDE 0.1 % MT PSTE: 1.0000 "application " | PASTE | Freq: Two times a day (BID) | OROMUCOSAL | 1 refills | Status: DC

## 2018-07-28 NOTE — Telephone Encounter (Signed)
Copied from [facility] [phone]. Topic: Quick Communication - Rx Refill/Question >> Jul 28, 2018  9:50 AM Rainey Pines A wrote: Medication: triamcinolone (KENALOG) 0.1 % paste v(3 month supply is free through [facility]  Has the patient contacted their pharmacy?Yes (Agent: If no, request that the patient contact the pharmacy for the refill.) (Agent: If yes, when and what did the pharmacy advise?)Contact PCP  Preferred Pharmacy (with phone number or street name): [facility], [place] [phone] (Phone) [phone] (Fax)    Agent: Please be advised that RX refills may take up to 3 business days. We ask that you follow-up with your pharmacy.

## 2018-07-30 ENCOUNTER — Other Ambulatory Visit: Payer: Self-pay | Admitting: Family

## 2018-08-03 ENCOUNTER — Other Ambulatory Visit: Payer: Self-pay

## 2018-08-03 ENCOUNTER — Telehealth: Payer: Self-pay | Admitting: Internal Medicine

## 2018-08-03 ENCOUNTER — Ambulatory Visit (INDEPENDENT_AMBULATORY_CARE_PROVIDER_SITE_OTHER): Payer: Medicare Other | Admitting: Family

## 2018-08-03 DIAGNOSIS — K120 Recurrent oral aphthae: Secondary | ICD-10-CM | POA: Diagnosis not present

## 2018-08-03 DIAGNOSIS — J4520 Mild intermittent asthma, uncomplicated: Secondary | ICD-10-CM | POA: Diagnosis not present

## 2018-08-03 MED ORDER — FLUTICASONE PROPIONATE HFA 110 MCG/ACT IN AERO: 2.0000 | INHALATION_SPRAY | Freq: Two times a day (BID) | RESPIRATORY_TRACT | 5 refills | Status: DC

## 2018-08-03 MED ORDER — ALBUTEROL SULFATE HFA 108 (90 BASE) MCG/ACT IN AERS: 2.0000 | INHALATION_SPRAY | Freq: Four times a day (QID) | RESPIRATORY_TRACT | 5 refills | Status: DC | PRN

## 2018-08-03 MED FILL — ALBUTEROL SULFATE HFA 108 (: 108 (90 BAS | 25 days supply | Qty: 9 | Fill #0

## 2018-08-03 MED FILL — FLOVENT HFA 110 MCG INHALER: 110 | 30 days supply | Qty: 12 | Fill #0

## 2018-08-03 NOTE — Telephone Encounter (Signed)
Returned call to patient. Pt will see ENT in July but still having some trouble with her breathing. Advised per AVS at last visit MW wanted to see her in office with all meds in hand. Visit change back to in office visit instead of televisit. Nothing further needed.

## 2018-08-03 NOTE — Progress Notes (Signed)
Virtual Visit via Video Note  I connected with Mary Flynn on 08/03/18 at  9:20 AM EDT by a video enabled telemedicine application and verified that I am speaking with the correct person using two identifiers.  Location: Patient: home Provider: home   I discussed the limitations of evaluation and management by telemedicine and the availability of in person appointments. The patient expressed understanding and agreed to proceed.  History of Present Illness:  Patient reports that 5-6 days ago she developed "breathlessness" "choking type of thing."  Reports that she called her cardiologist and was told to try her inhalers.  She reports that she took (? Salmeterol) this was prescribed to her in [place].  Also using ?fluticasone bid. Notes this is helping considerably and she would like refills.   She reports + canker sores that have been bothering her.    Past Medical History:  Diagnosis Date  . Diabetes mellitus   . Hyperlipidemia   . Hypertension   . Left rib fracture 02/18/2014     Social History   Socioeconomic History  . Marital status: Married    Spouse name: Ekta Dancer  . Number of children: 2  . Years of education: College  . Highest education level: Not on file  Occupational History  . Occupation: housewife  Social Needs  . Financial resource strain: Not on file  . Food insecurity:    Worry: Not on file    Inability: Not on file  . Transportation needs:    Medical: Not on file    Non-medical: Not on file  Tobacco Use  . Smoking status: Never Smoker  . Smokeless tobacco: Never Used  Substance and Sexual Activity  . Alcohol use: No  . Drug use: No  . Sexual activity: Not Currently  Lifestyle  . Physical activity:    Days per week: Not on file    Minutes per session: Not on file  . Stress: Not on file  Relationships  . Social connections:    Talks on phone: Not on file    Gets together: Not on file    Attends religious service: Not on file    Active  member of club or organization: Not on file    Attends meetings of clubs or organizations: Not on file    Relationship status: Not on file  . Intimate partner violence:    Fear of current or ex partner: Not on file    Emotionally abused: Not on file    Physically abused: Not on file    Forced sexual activity: Not on file  Other Topics Concern  . Not on file  Social History Narrative   Patient lives at home with spouse.   Caffeine Use: rarely    Past Surgical History:  Procedure Laterality Date  . APPENDECTOMY    . REFRACTIVE SURGERY     Left Eye  . surgical sterilization  40 years ago    Family History  Problem Relation Age of Onset  . Diabetes Mother   . Hypertension Father   . Depression Other   . Cancer Other        laryngeal  . Diabetes Other     Allergies  Allergen Reactions  . Ace Inhibitors Cough  . Losartan Cough  . Penicillins Hives  . Sulfa Antibiotics Hives  . Sulfonamide Derivatives     Current Outpatient Medications on File Prior to Visit  Medication Sig Dispense Refill  . acetaminophen-codeine 120-12 MG/5ML solution Take 2.5 mLs  by mouth every 4 (four) hours as needed for moderate pain.    Marland Kitchen amLODipine (NORVASC) 10 MG tablet TAKE 1 TABLET BY MOUTH  DAILY 90 tablet 0  . aspirin EC 81 MG tablet Take 81 mg by mouth daily.    Marland Kitchen atorvastatin (LIPITOR) 10 MG tablet TAKE 1 TABLET BY MOUTH  EVERY DAY 90 tablet 1  . b complex vitamins tablet Take 1 tablet by mouth daily.    . Blood Glucose Monitoring Suppl (ONE TOUCH ULTRA 2) w/Device KIT USE AS DIRECTED TO TEST  BLOOD SUGAR TWO TIMES DAILY 1 each 1  . calcium citrate-vitamin D (CITRACAL+D) 315-200 MG-UNIT tablet Take 1 tablet by mouth 2 (two) times daily.    . canagliflozin (INVOKANA) 100 MG TABS tablet Take 100 mg by mouth daily before breakfast.    . chlorpheniramine (CHLOR-TRIMETON) 4 MG tablet Take 4 mg by mouth every 4 (four) hours as needed for allergies.    . cyclobenzaprine (FLEXERIL) 10 MG tablet  TAKE 1 TABLET BY MOUTH TWO  TIMES DAILY AS NEEDED FOR  MUSCLE SPASM 180 tablet 0  . DULoxetine (CYMBALTA) 30 MG capsule TAKE 1 CAPSULE BY MOUTH  DAILY 90 capsule 1  . famotidine (PEPCID) 20 MG tablet Take 1 tablet (20 mg total) by mouth at bedtime. 90 tablet 3  . fluconazole (DIFLUCAN) 150 MG tablet Take 1 tab by mouth once as needed for yeast infection 3 tablet 0  . fluticasone (FLONASE) 50 MCG/ACT nasal spray Place 2 sprays into both nostrils daily. 16 g 6  . gabapentin (NEURONTIN) 300 MG capsule Take 1 capsule (300 mg total) by mouth 4 (four) times daily. 360 capsule 1  . glimepiride (AMARYL) 4 MG tablet TAKE 1 TABLET BY MOUTH  TWICE A DAY 180 tablet 1  . glucose blood (ONE TOUCH ULTRA TEST) test strip USE TO TEST BLOOD SUGARS  TWO TIMES DAILY 200 each 1  . Insulin Detemir (LEVEMIR FLEXPEN) 100 UNIT/ML Pen Inject 14 Units into the skin at bedtime.  15 mL 3  . insulin lispro (HUMALOG KWIKPEN) 100 UNIT/ML KiwkPen 9 units with breakfast and lunch, 4 units at evening meal    . Insulin Pen Needle 32G X 4 MM MISC Use 3 per day 100 each 3  . latanoprost (XALATAN) 0.005 % ophthalmic solution Place 1 drop into both eyes at bedtime.    Marland Kitchen levocetirizine (XYZAL) 5 MG tablet TAKE 1 TABLET BY MOUTH  EVERY EVENING 90 tablet 1  . levothyroxine (SYNTHROID) 25 MCG tablet TAKE 1 TABLET BY MOUTH  DAILY BEFORE BREAKFAST 90 tablet 0  . Melatonin 5 MG TABS Take 1 tablet by mouth at bedtime as needed.    . metFORMIN (GLUCOPHAGE) 1000 MG tablet TAKE 1 TABLET BY MOUTH TWO  TIMES DAILY WITH MEALS 180 tablet 1  . metoprolol succinate (TOPROL-XL) 100 MG 24 hr tablet TAKE 1 TABLET BY MOUTH  DAILY WITH OR IMMEDIATELY  FOLLOWING A MEAL 90 tablet 1  . mirtazapine (REMERON) 7.5 MG tablet TAKE 1 TABLET BY MOUTH AT  BEDTIME (Patient taking differently: Take 7.5 mg by mouth as needed. ) 90 tablet 1  . montelukast (SINGULAIR) 10 MG tablet TAKE 1 TABLET BY MOUTH AT  BEDTIME 90 tablet 1  . Multiple Vitamins-Minerals (HAIR SKIN AND  NAILS FORMULA PO) Take by mouth daily.    . niacin (NIASPAN) 500 MG CR tablet Take 2 tablets (1,000 mg total) by mouth at bedtime. 90 tablet 1  . NONFORMULARY OR COMPOUNDED ITEM Lumbar  support belt  #1  As directed 1 each 0  . nystatin ointment (MYCOSTATIN) Apply 1 application topically 2 (two) times daily as needed. 90 g 0  . pantoprazole (PROTONIX) 40 MG tablet TAKE 1 TABLET BY MOUTH  DAILY 90 tablet 1  . timolol (BETIMOL) 0.5 % ophthalmic solution Place 1 drop into both eyes 2 (two) times daily.    Marland Kitchen triamcinolone (KENALOG) 0.1 % paste Use as directed 1 application in the mouth or throat 2 (two) times daily. 15 g 1  . vitamin E 400 UNIT capsule Take 400 Units by mouth daily.     No current facility-administered medications on file prior to visit.     There were no vitals taken for this visit.   Observations/Objective:   Gen: Awake, alert, no acute distress Resp: Breathing is even and non-labored Psych: calm/pleasant demeanor Neuro: Alert and Oriented x 3, + facial symmetry, speech is clear.  Assessment and Plan:  Asthma- pt reports improvement in symptoms with inhaled steroid and beta agonist.  Will send rx for flovent and albuterol to her local pharmacy.  Canker sores- pt is aware that rx has been sent to her mail order for kenalog paste.  Follow Up Instructions:    I discussed the assessment and treatment plan with the patient. The patient was provided an opportunity to ask questions and all were answered. The patient agreed with the plan and demonstrated an understanding of the instructions.   The patient was advised to call back or seek an in-person evaluation if the symptoms worsen or if the condition fails to improve as anticipated.  Nance Pear, NP

## 2018-08-08 ENCOUNTER — Other Ambulatory Visit: Payer: Self-pay

## 2018-08-08 ENCOUNTER — Encounter: Payer: Self-pay | Admitting: Internal Medicine

## 2018-08-08 ENCOUNTER — Ambulatory Visit (INDEPENDENT_AMBULATORY_CARE_PROVIDER_SITE_OTHER): Payer: Medicare Other

## 2018-08-08 ENCOUNTER — Ambulatory Visit (INDEPENDENT_AMBULATORY_CARE_PROVIDER_SITE_OTHER): Payer: Medicare Other | Admitting: Internal Medicine

## 2018-08-08 ENCOUNTER — Ambulatory Visit: Payer: Medicare Other | Admitting: Endocrinology

## 2018-08-08 VITALS — BP 118/60 | HR 90 | Wt 134.0 lb

## 2018-08-08 DIAGNOSIS — I10 Essential (primary) hypertension: Secondary | ICD-10-CM

## 2018-08-08 DIAGNOSIS — R0609 Other forms of dyspnea: Secondary | ICD-10-CM | POA: Diagnosis not present

## 2018-08-08 DIAGNOSIS — R05 Cough: Secondary | ICD-10-CM

## 2018-08-08 DIAGNOSIS — R053 Chronic cough: Secondary | ICD-10-CM

## 2018-08-08 LAB — CBC WITH DIFFERENTIAL/PLATELET
Basophils Absolute: 0 10*3/uL (ref 0.0–0.1)
Basophils Relative: 0.6 % (ref 0.0–3.0)
Eosinophils Absolute: 0.1 10*3/uL (ref 0.0–0.7)
Eosinophils Relative: 1.6 % (ref 0.0–5.0)
HCT: 40.1 % (ref 36.0–46.0)
Hemoglobin: 12.6 g/dL (ref 12.0–15.0)
Lymphocytes Relative: 22.6 % (ref 12.0–46.0)
Lymphs Abs: 1.8 10*3/uL (ref 0.7–4.0)
MCHC: 31.5 g/dL (ref 30.0–36.0)
MCV: 78.6 fl (ref 78.0–100.0)
Monocytes Absolute: 0.9 10*3/uL (ref 0.1–1.0)
Monocytes Relative: 11.7 % (ref 3.0–12.0)
Neutro Abs: 5.1 10*3/uL (ref 1.4–7.7)
Neutrophils Relative %: 63.5 % (ref 43.0–77.0)
Platelets: 370 10*3/uL (ref 150.0–400.0)
RBC: 5.1 Mil/uL (ref 3.87–5.11)
RDW: 19.2 % — ABNORMAL HIGH (ref 11.5–15.5)
WBC: 8.1 10*3/uL (ref 4.0–10.5)

## 2018-08-08 LAB — BASIC METABOLIC PANEL
BUN: 12 mg/dL (ref 6–23)
CO2: 27 mEq/L (ref 19–32)
Calcium: 9.8 mg/dL (ref 8.4–10.5)
Chloride: 100 mEq/L (ref 96–112)
Creatinine, Ser: 0.69 mg/dL (ref 0.40–1.20)
GFR: 81.49 mL/min (ref >60.00)
Glucose, Bld: 141 mg/dL — ABNORMAL HIGH (ref 70–99)
Potassium: 4 mEq/L (ref 3.5–5.1)
Sodium: 137 mEq/L (ref 135–145)

## 2018-08-08 LAB — BRAIN NATRIURETIC PEPTIDE: Pro B Natriuretic peptide (BNP): 86 pg/mL (ref 0.0–100.0)

## 2018-08-08 LAB — SEDIMENTATION RATE: Sed Rate: 9 mm/hr (ref 0–30)

## 2018-08-08 NOTE — Patient Instructions (Addendum)
Reduce amlodipine to 10 mg one half daily to see if helps your swelling   Stop Vitamin  E to see what difference it makes   Keep appt with Dr Joya Gaskins for your sensation of throat fullness since childhood   Please remember to go to the lab and x-ray department   for your tests - we will call you with the results when they are available.    Please schedule a follow up office visit in 2 weeks , call sooner if needed with all medications /inhalers/ solutions in hand so we can verify exactly what you are taking. This includes all medications from all doctors and over the [place] separate them into two bags:  the ones you take automatically, no matter what, vs the ones you take just when you feel you need them "BAG #2 is UP TO YOU"  - this will really help [place] help you take your medications more effectively.

## 2018-08-08 NOTE — Progress Notes (Signed)
Subjective:     Patient ID: Mary Flynn, female   DOB: 11-04-1934 MRN: [id number]    Brief patient profile:  84 yo [place] female never smoker who reports lifelong globus sensation with dysphagia and no other  childhood resp problems including pregancy then around 2001 began a pattern of recurrent cyclical cough  pattern lasting from sev  months up to a year and then resolves s specific rx or need for maint rx  up to 4-5 months self referred to pulmonary clinic 01/11/2013 for re-eval of recurrent persistent cough - Workup including allergy testing,barium swallow, 24hour pH probe nonrevealing previously     History of Present Illness  01/11/2013 1st [facility] office visit/ Cederick Broadnax in EMR era with recurrent cough Chief Complaint  Patient presents with  . Acute Visit    Pt c/o increased cough x 2 wks- prod with large amounts of clear sputum.  She states that she coughs until she gags and vomits.   cough tends to be worse with any thing in her mouth and with talking.  Actual mucus production is min and "nothing ever works" to include abx, prednisone, inhalers, gerd rx. eval at [facility] with conclusion "this isn't reflux" Already eval 01/02/13 by Dr Larose Kells rx with zpack with sorethroat that has resolved  >>Stop cozar (losartan), stop calcium carbonate, and the fosfamax, Benicar 20 mg one daily  Protonix 40 mg Take 30- 60 min before your first and last meals of the day  Neurontin (gabapentin) 100 mg take 2 with every meal Chlortrimeton 4 mg at bedtime     11/11/2017  Pulmonary  consulation / Melvyn Novas recurrent cough since 08/2017 / confused with med names Chief Complaint  Patient presents with  . Pulmonary Consult    Referred by Dr Earlie Counts. Pt c/o increased cough x 2-3 months. She states eating and coughing can trigger the cough.  She is coughing up white sputum. Cough occ wakes her up in the night. She also c/o watery eyes and runny nose.    after flew to [place] summer 2019 > recurrent  cough p two weeks into the trip "felt like a cold but it's gone" Gradually worse since with use of voice Bed Is flat  Taking ppi pc and cough drops  Unless coughing > Not limited by breathing from desired activities  No better on singulair / flonase/xyzal and only taking gabapentin twice daily  rec The key to effective treatment for your cough is eliminating the non-stop cycle of cough you're stuck in long enough to let your airway heal completely and then see if there is anything still making you cough once you stop the cough suppression, but this should take no more than 5 days to figure out First take delsym two tsp every 12 hours and supplement if needed with  tramadol 50 mg up to 1  every 4 hours to suppress the urge to cough at all or even clear your throat. Swallowing water or using ice chips/non mint and menthol containing candies (such as lifesavers or sugarless jolly ranchers) are also effective.  You should rest your voice and avoid activities that you know make you cough. Once you have eliminated the cough for 3 straight days try reducing the tramadol first,  then the delsym as tolerated.  Increase gabapentin to 300 mg three times a day    12/28/2017 acute extended ov/Colie Josten re: cough since 08/2017  Chief Complaint  Patient presents with  . Acute Visit    Cough has been  worse over the past wk.  She is coughing up white sputum.  She has noticed that talking can trigger the cough.  She has had some choking when she eats.   never really 100% better since 08/2017 with day > noct min prod cough assoc with eating and speaking then abruptly much worse x one week but same overall pattern   No sob unless coughing Sleeping flat ok rec For drainage / throat tickle try take CHLORPHENIRAMINE  4 mg - take one every 4 hours as needed - available over the counter- may cause drowsiness so start with just a bedtime dose or two a  Prednisone 10 mg take  4 each am x 2 days,   2 each am x 2 days,  1 each am  x 2 days and stop  Take delsym two tsp every 12 hours and supplement if needed with  Tylenol #3  up to 1 every 4 hours Increase gabapentin to 300 mg four times daily  Follow the calendar as written  Please schedule a follow up office visit in 2 months , call sooner if needed with all medications /inhalers/ solutions in hand      06/27/2018  f/u ov/Daril Warga re: cough summer 2019 maint on xyazol, singulair, gabapentin 300 tid  Chief Complaint  Patient presents with  . Follow-up    Cough is much improved.   Dyspnea:  Mailbox and back sob slt incline x 32f Cough: still waking up 2-3 x per night and sips water or candy seem to help and goes back bed  Sleeping: no bed blocks  SABA use: none help 02: none Has changed hx once again to suggest that she does improve short term with pred/tylenol #3 short term only but never has relief from sense of ST/globus   rec Bedblocks are 6 - 8 inches at [employer] or [employer] or mattress stores Take chlorpheniramine 4 mg x 2 at bedtime and up to 1 every 4 hours during the day as needed Prednisone 10 mg take  4 each am x 2 days,   2 each am x 2 days,  1 each am x 2 days and stop Please see patient coordinator before you leave today  [facility] a follow up office visit in 4 weeks, call sooner if needed with all medications /inhalers/ solutions in hand so we can verify exactly what you are taking    08/08/2018  f/u ov/Abdias Hickam re:  uacs / globus still present  rx by pcp with flovent/ saba but says never started either one but again very sketchy re details of meds/ says brought all the ones she's using  Chief Complaint  Patient presents with  . Follow-up    Using inhaler approx. 2x a week. Feels like she has improved over all and cough is gone.Not sleeping well, and legs have started swelling.  Dyspnea:  No longer walking to mailbox due to doe  Cough: gone  Sleeping: fine with bed blocks SABA use: none 02: none    No  obvious day to day or daytime variability or assoc excess/ purulent sputum or mucus plugs or hemoptysis or cp or chest tightness, subjective wheeze or overt sinus or hb symptoms.   Sleeping  without nocturnal  or early am exacerbation  of respiratory  c/o's or need for noct saba. Also denies any obvious fluctuation of symptoms with weather or environmental changes or other aggravating or alleviating factors except as outlined above  No unusual exposure hx or h/o childhood pna/ asthma or knowledge of premature birth.  Current Allergies, Complete Past Medical History, Past Surgical History, Family History, and Social History were reviewed in [employer] record.  ROS  The following are not active complaints unless bolded Hoarseness, sore throat, dysphagia, dental problems, itching, sneezing,  nasal congestion or discharge of excess mucus or purulent secretions, ear ache,   fever, chills, sweats, unintended wt loss or wt gain, classically pleuritic or exertional cp,  orthopnea pnd or arm/hand swelling  or leg swelling, presyncope, palpitations, abdominal pain, anorexia, nausea, vomiting, diarrhea  or change in bowel habits or change in bladder habits, change in stools or change in urine, dysuria, hematuria,  rash, arthralgias, visual complaints, headache, numbness, weakness or ataxia or problems with walking or coordination,  change in mood or  memory.        Current Meds  Medication Sig  . albuterol (VENTOLIN HFA) 108 (90 Base) MCG/ACT inhaler Inhale 2 puffs into the lungs every 6 (six) hours as needed for wheezing or shortness of breath.  Marland Kitchen amLODipine (NORVASC) 10 MG tablet TAKE 1 TABLET BY MOUTH  DAILY  . aspirin EC 81 MG tablet Take 81 mg by mouth daily.  Marland Kitchen atorvastatin (LIPITOR) 10 MG tablet TAKE 1 TABLET BY MOUTH  EVERY DAY  . b complex vitamins tablet Take 1 tablet by mouth daily.  . Blood Glucose Monitoring Suppl (ONE TOUCH ULTRA 2) w/Device KIT USE AS DIRECTED TO TEST   BLOOD SUGAR TWO TIMES DAILY  . calcium citrate-vitamin D (CITRACAL+D) 315-200 MG-UNIT tablet Take 1 tablet by mouth 2 (two) times daily.  . canagliflozin (INVOKANA) 100 MG TABS tablet Take 100 mg by mouth daily before breakfast.  . chlorpheniramine (CHLOR-TRIMETON) 4 MG tablet Take 4 mg by mouth every 4 (four) hours as needed for allergies.  . cyclobenzaprine (FLEXERIL) 10 MG tablet TAKE 1 TABLET BY MOUTH TWO  TIMES DAILY AS NEEDED FOR  MUSCLE SPASM  . DULoxetine (CYMBALTA) 30 MG capsule TAKE 1 CAPSULE BY MOUTH  DAILY  . famotidine (PEPCID) 20 MG tablet Take 1 tablet (20 mg total) by mouth at bedtime.  . fluconazole (DIFLUCAN) 150 MG tablet Take 1 tab by mouth once as needed for yeast infection  . fluticasone (FLONASE) 50 MCG/ACT nasal spray Place 2 sprays into both nostrils daily.  .     . gabapentin (NEURONTIN) 300 MG capsule Take 1 capsule (300 mg total) by mouth 4 (four) times daily.  Marland Kitchen glimepiride (AMARYL) 4 MG tablet TAKE 1 TABLET BY MOUTH  TWICE A DAY  . glucose blood (ONE TOUCH ULTRA TEST) test strip USE TO TEST BLOOD SUGARS  TWO TIMES DAILY  . Insulin Detemir (LEVEMIR FLEXPEN) 100 UNIT/ML Pen Inject 14 Units into the skin at bedtime.   . insulin lispro (HUMALOG KWIKPEN) 100 UNIT/ML KiwkPen 9 units with breakfast and lunch, 4 units at evening meal  . Insulin Pen Needle 32G X 4 MM MISC Use 3 per day  . latanoprost (XALATAN) 0.005 % ophthalmic solution Place 1 drop into both eyes at bedtime.  Marland Kitchen levocetirizine (XYZAL) 5 MG tablet TAKE 1 TABLET BY MOUTH  EVERY EVENING  . levothyroxine (SYNTHROID) 25 MCG tablet TAKE 1 TABLET BY MOUTH  DAILY BEFORE BREAKFAST  . Melatonin 5 MG TABS Take 1 tablet by mouth at bedtime as needed.  . metFORMIN (GLUCOPHAGE) 1000 MG tablet TAKE 1 TABLET BY MOUTH TWO  TIMES DAILY WITH MEALS  . metoprolol succinate (TOPROL-XL)  100 MG 24 hr tablet TAKE 1 TABLET BY MOUTH  DAILY WITH OR IMMEDIATELY  FOLLOWING A MEAL  . mirtazapine (REMERON) 7.5 MG tablet TAKE 1 TABLET BY  MOUTH AT  BEDTIME (Patient taking differently: Take 7.5 mg by mouth as needed. )  . montelukast (SINGULAIR) 10 MG tablet TAKE 1 TABLET BY MOUTH AT  BEDTIME  . Multiple Vitamins-Minerals (HAIR SKIN AND NAILS FORMULA PO) Take by mouth daily.  . niacin (NIASPAN) 500 MG CR tablet Take 2 tablets (1,000 mg total) by mouth at bedtime.  . NONFORMULARY OR COMPOUNDED ITEM Lumbar support belt  #1  As directed  . nystatin ointment (MYCOSTATIN) Apply 1 application topically 2 (two) times daily as needed.  . pantoprazole (PROTONIX) 40 MG tablet TAKE 1 TABLET BY MOUTH  DAILY  . timolol (BETIMOL) 0.5 % ophthalmic solution Place 1 drop into both eyes 2 (two) times daily.  Marland Kitchen triamcinolone (KENALOG) 0.1 % paste Use as directed 1 application in the mouth or throat 2 (two) times daily.  . vitamin E 400 UNIT capsule Take 400 Units by mouth daily.                 Objective:   Physical Exam  amb indian female nad   05/03/2013      136 > 11/11/2017   > 11/11/2017     138 >  12/28/2017   139 > 04/20/2018   141 > 06/27/2018  135 > 08/08/2018  134    04/14/13 136 lb (61.689 kg)  03/28/13 137 lb (62.143 kg)  03/24/13 138 lb (62.596 kg)        Vital signs reviewed - Note on arrival 02 sats  94% on RA     HEENT: nl dentition, turbinates bilaterally, and oropharynx. Nl external ear canals without cough reflex   NECK :  without JVD/Nodes/TM/ nl carotid upstrokes bilaterally   LUNGS: no acc muscle use,  Nl contour chest which is clear to A and P bilaterally without cough on insp or exp maneuvers   CV:  RRR  no s3 or murmur or increase in P2, and  1+ Pitting both lower ext  ABD:  soft and nontender with nl inspiratory excursion in the supine position. No bruits or organomegaly appreciated, bowel sounds nl  MS:  Nl gait/ ext warm without deformities, calf tenderness, cyanosis or clubbing No obvious joint restrictions   SKIN: warm and dry without lesions    NEURO:  alert, approp, nl sensorium with  no motor or  cerebellar deficits apparent.        CXR PA and Lateral:   08/08/2018 :    I personally reviewed images / impression as follows:   Non specific mild/mod increase markings   Labs ordered/ reviewed:      Chemistry      Component Value Date/Time   NA 137 08/08/2018 1507   NA 136 06/02/2011 1328   K 4.0 08/08/2018 1507   K 4.1 06/02/2011 1328   CL 100 08/08/2018 1507   CL 100 06/02/2011 1328   CO2 27 08/08/2018 1507   CO2 28 06/02/2011 1328   BUN 12 08/08/2018 1507   BUN 13 06/02/2011 1328   CREATININE 0.69 08/08/2018 1507   CREATININE 0.62 06/02/2011 1328      Component Value Date/Time   CALCIUM 9.8 08/08/2018 1507   CALCIUM 9.2 06/02/2011 1328   ALKPHOS 46 07/08/2018 0814   AST 19 07/08/2018 0814   ALT 15 07/08/2018 0814   BILITOT  1.1 07/08/2018 0814        Lab Results  Component Value Date   WBC 8.1 08/08/2018   HGB 12.6 08/08/2018   HCT 40.1 08/08/2018   MCV 78.6 08/08/2018   PLT 370.0 08/08/2018          Lab Results  Component Value Date   PROBNP 86.0 08/08/2018       Lab Results  Component Value Date   ESRSEDRATE 9 08/08/2018   ESRSEDRATE 4 05/17/2017                  Assessment:

## 2018-08-09 ENCOUNTER — Other Ambulatory Visit: Payer: Medicare Other

## 2018-08-09 ENCOUNTER — Telehealth: Payer: Self-pay | Admitting: *Deleted

## 2018-08-09 ENCOUNTER — Encounter: Payer: Self-pay | Admitting: Internal Medicine

## 2018-08-09 DIAGNOSIS — R0609 Other forms of dyspnea: Secondary | ICD-10-CM

## 2018-08-09 LAB — D-DIMER, QUANTITATIVE: D-Dimer, Quant: <0.19 mcg/mL FEU (ref <0.50)

## 2018-08-09 MED ORDER — AMLODIPINE BESYLATE 10 MG PO TABS: ORAL_TABLET | ORAL | 0 refills | Status: DC

## 2018-08-09 NOTE — Telephone Encounter (Signed)
-----   Message from Tanda Rockers, MD sent at 08/09/2018  6:52 AM EDT ----- After further review needs to return for d dimer dx doe and set up for echo - same dx with d dimer asap (meant to order it at ov so ordered for 08/09/2018

## 2018-08-09 NOTE — Assessment & Plan Note (Signed)
Onset 2001 though globus sensation since childhood  - GI w/u 2004:  Mod BS nl, nl 24 h pH off all GI meds > rec d/c GERD rx  Per Dr Henrene Pastor  -PFT 2012 normal -no airflow obstruction, no restriction, moderately reduced diffusing capacity - increased neurontin to 200 tid 01/11/2013 > 300 mg tid as of 02/22/2013  -CT sinus 04/05/13 >Chronic sphenoid sinusitis. Slight nasal septal deviation right to left. -CT chest 04/05/13 >Patchy areas of paraseptal and central lobar emphysema. . Patchy ground-glass opacities suggest a partial airspace filling process and could reflect mild edema or inflammation. . Linear scarring changes in both lungs. - 08/23/2013 reporting 100% resolution on neurontin 300 tid rec taper off x one month - 11/11/2017 re-eval for cough with  flare 08/2017 since uri ? Related to air travel> rec cyclical cough regimen and increase gabapentin back to 300 tid.  - 12/28/2017 increased gabapentin to 300 mg qid and refer to Dr Eliott Nine voice center = next step > did not go - 04/20/2018 relapsed cough on ?  rx > rec resumed gabapentin 300 mg qid and 1st gen H1 blockers per guidelines  > improved 08/08/2018 but globus persisted > referred to Dr Bettina Gavia [facility]    No change rx for now, finally appear to have medication reconciliation/ benefit from rx as uacs, not asthma.

## 2018-08-09 NOTE — Assessment & Plan Note (Addendum)
Onset winter 2020  - HRCT Chest 07/07/18 bronchiectasis, mild, no ild  -08/08/2018   Walked RA x one lap =  approx 250 ft - stopped due to  Sob/ mod pace, desats to 84% at end  - 08/08/2018  After extensive coaching inhaler device,  effectiveness =  0% so very strongly doubt she's had any benefit from using inhalers   Can't explain c/o leg swelling or doe on basis of hrct or bnp > needs d dimer and echo to complete the w/u    I had an extended discussion with the patient reviewing all relevant studies completed to date and  lasting 15 to 20 minutes of a 25 minute visit    See device teaching and direct observation of amb 02 sats study  which extended face to face time for this visit.  Each maintenance medication was reviewed in detail including emphasizing most importantly the difference between maintenance and prns and under what circumstances the prns are to be triggered using an action plan format that is not reflected in the computer generated alphabetically organized AVS which I have not found useful in most complex patients, especially with respiratory illnesses  Please see AVS for specific instructions unique to this visit that I personally wrote and verbalized to the the pt in detail and then reviewed with pt  by my nurse highlighting any  changes in therapy recommended at today's visit to their plan of care.

## 2018-08-09 NOTE — Progress Notes (Signed)
ATC, NA and no option to leave msg 

## 2018-08-09 NOTE — Telephone Encounter (Signed)
Spoke with the pt and notified of recs per MW  She verbalized understanding  Lab already ordered  I have ordered ECHO

## 2018-08-09 NOTE — Assessment & Plan Note (Signed)
Reduced norvasc to 5 mg daily as may be contributing to edema and interfering with v/q matching causing desats on waling study 08/08/2018

## 2018-08-09 NOTE — Progress Notes (Signed)
Spoke with pt and notified of results per Dr. Wert. Pt verbalized understanding and denied any questions. 

## 2018-08-09 NOTE — Telephone Encounter (Signed)
ATC pt, NA and no option to leave a msg, [facility]

## 2018-08-10 NOTE — Progress Notes (Signed)
Spoke with pt and notified of results per Dr. Wert. Pt verbalized understanding and denied any questions. 

## 2018-08-11 MED FILL — ERYTHROMYCIN EYE OINTMENT: 5 | 15 days supply | Qty: 4 | Fill #0

## 2018-08-12 ENCOUNTER — Telehealth (HOSPITAL_COMMUNITY): Payer: Self-pay | Admitting: Radiology

## 2018-08-12 NOTE — Telephone Encounter (Signed)

## 2018-08-15 ENCOUNTER — Ambulatory Visit (HOSPITAL_COMMUNITY): Payer: Medicare Other | Attending: Cardiovascular Disease

## 2018-08-15 ENCOUNTER — Other Ambulatory Visit: Payer: Self-pay

## 2018-08-15 DIAGNOSIS — R0609 Other forms of dyspnea: Secondary | ICD-10-CM | POA: Diagnosis not present

## 2018-08-19 ENCOUNTER — Other Ambulatory Visit: Payer: Self-pay | Admitting: Internal Medicine

## 2018-08-19 ENCOUNTER — Other Ambulatory Visit: Payer: Self-pay | Admitting: Family

## 2018-08-19 DIAGNOSIS — E1140 Type 2 diabetes mellitus with diabetic neuropathy, unspecified: Secondary | ICD-10-CM

## 2018-08-19 DIAGNOSIS — K219 Gastro-esophageal reflux disease without esophagitis: Secondary | ICD-10-CM

## 2018-08-19 DIAGNOSIS — E785 Hyperlipidemia, unspecified: Secondary | ICD-10-CM

## 2018-08-19 DIAGNOSIS — I10 Essential (primary) hypertension: Secondary | ICD-10-CM

## 2018-08-23 ENCOUNTER — Ambulatory Visit (INDEPENDENT_AMBULATORY_CARE_PROVIDER_SITE_OTHER): Payer: Medicare Other | Admitting: Endocrinology

## 2018-08-23 ENCOUNTER — Other Ambulatory Visit: Payer: Self-pay

## 2018-08-23 ENCOUNTER — Encounter: Payer: Self-pay | Admitting: Endocrinology

## 2018-08-23 VITALS — BP 118/70 | HR 86 | Temp 97.8°F | Ht <= 58 in | Wt 130.4 lb

## 2018-08-23 DIAGNOSIS — E063 Autoimmune thyroiditis: Secondary | ICD-10-CM

## 2018-08-23 DIAGNOSIS — Z794 Long term (current) use of insulin: Secondary | ICD-10-CM | POA: Diagnosis not present

## 2018-08-23 DIAGNOSIS — E1165 Type 2 diabetes mellitus with hyperglycemia: Secondary | ICD-10-CM

## 2018-08-23 NOTE — Patient Instructions (Addendum)
Levemir 12 units  Take insulin 10 min before meals  Check blood sugars on waking up 3 days a week  Also check blood sugars about 2 hours after meals and do this after different meals by rotation  Recommended blood sugar levels on waking up are 90-130 and about 2 hours after meal is 130-160  Please bring your blood sugar monitor to each visit, thank you  Invokana before Bfst

## 2018-08-23 NOTE — Progress Notes (Signed)
Patient ID: Mary Flynn, female   DOB: [birth year], 84 y.o.   MRN: [id number]   Reason for Appointment: Diabetes and general follow-up   History of Present Illness   Diagnosis: Type 2 DIABETES MELITUS, date of diagnosis:   1995    Previous history: She has been on various regimens of hypoglycemic drugs over the last several years including Actos, Amaryl and Januvia. Because of cost her Januvia was stopped and Actos was stopped because of potential weight gain She was initially started on Levemir insulin because of fasting hyperglycemia in 2009 and the dose has been adjusted periodically In the last year she is also quite mealtime coverage because of postprandial hyperglycemia despite usually eating a low carbohydrate diet   Recent history:   Insulin regimen: Levemir 14 units hs;  Humalog 8 at breakfast, 8 at lunch and 3-4 acs Oral hypoglycemic drugs: Amaryl 2 mg twice a day, metformin, Invokana '100mg'$  in am   Current blood sugar patterns and problems identified:  Her last A1c was 8%  On her last visit in 5/20 she was on prednisone for about 6 weeks causing higher blood sugars but she did not increase her Levemir accordingly but had gone-up on her Humalog  She has been off prednisone for more than a month now  Her fasting blood sugars are usually excellent with less variability and only 1 significantly high reading of 188  However she also has had a mildly low reading of 60 7 in the morning and 62 before dinner around 7 PM  She says she does not like to walk outside and is only doing a recumbent bike exercise in the mornings at home  Despite constant reminders she forgets to check her readings after meals and not clear what her postprandial readings are  She does not adjust her mealtime insulin based on what she is eating  She now discloses that she will take her Humalog only after eating because she is afraid of low sugars  Partly because of this she will forget her  insulin in the evening sometimes at dinnertime  However she is also taking her Invokana after breakfast  She is saying that she is avoiding sweets completely and eating small portions, weight is slightly less  Hypoglycemia :  none documented but she feels some weakness when blood sugars are in the 70s and 80s    Side effects from medications: None.                Monitors blood glucose: Once  a day.    Glucometer:  One Touch ultra 2        Blood Glucose readings from meter download:   FASTING range 67-188 with MEDIAN 108  Previous readings:  PRE-MEAL Fasting Lunch Dinner Bedtime Overall  Glucose range:  109-232      Mean/median:  185         Meals: 3 meals per day she is a vegetarian. Trying to get some protein at most meals including dairy products and tofu  Has breakfast at 10 am,  lunch around 2 PM, dinner at 8 PM           Last visit with dietitian: 2009   Wt Readings from Last 3 Encounters:  08/23/18 130 lb 6.4 oz (59.1 kg)  08/08/18 134 lb (60.8 kg)  07/22/18 135 lb (61.2 kg)    LABS:  Lab Results  Component Value Date   HGBA1C 8.0 (A) 07/11/2018   HGBA1C 8.5 (  H) 07/08/2018   HGBA1C 7.9 (H) 10/29/2017   Lab Results  Component Value Date   MICROALBUR 3.8 (H) 07/08/2018   LDLCALC 42 12/21/2017   CREATININE 0.69 08/08/2018    Other issues discussed today: See review of systems   Allergies as of 08/23/2018      Reactions   Ace Inhibitors Cough   Losartan Cough   Penicillins Hives   Sulfa Antibiotics Hives   Sulfonamide Derivatives       Medication List       Accurate as of August 23, 2018  2:29 PM. If you have any questions, ask your nurse or doctor.        acetaminophen-codeine 120-12 MG/5ML solution Take 2.5 mLs by mouth every 4 (four) hours as needed for moderate pain.   albuterol 108 (90 Base) MCG/ACT inhaler Commonly known as: VENTOLIN HFA Inhale 2 puffs into the lungs every 6 (six) hours as needed for wheezing or shortness of breath.     amLODipine 10 MG tablet Commonly known as: NORVASC One half daily   aspirin EC 81 MG tablet Take 81 mg by mouth daily.   atorvastatin 10 MG tablet Commonly known as: LIPITOR TAKE 1 TABLET BY MOUTH  EVERY DAY   b complex vitamins tablet Take 1 tablet by mouth daily.   calcium citrate-vitamin D 315-200 MG-UNIT tablet Commonly known as: CITRACAL+D Take 1 tablet by mouth 2 (two) times daily.   Chlor-Trimeton 4 MG tablet Generic drug: chlorpheniramine Take 4 mg by mouth every 4 (four) hours as needed for allergies.   cyclobenzaprine 10 MG tablet Commonly known as: FLEXERIL TAKE 1 TABLET BY MOUTH TWO  TIMES DAILY AS NEEDED FOR  MUSCLE SPASM   DULoxetine 30 MG capsule Commonly known as: CYMBALTA TAKE 1 CAPSULE BY MOUTH  DAILY   famotidine 20 MG tablet Commonly known as: Pepcid Take 1 tablet (20 mg total) by mouth at bedtime.   fluconazole 150 MG tablet Commonly known as: DIFLUCAN TAKE 1 TABLET BY MOUTH ONCE AS NEEDED FOR YEAST  INFECTION   fluticasone 110 MCG/ACT inhaler Commonly known as: Flovent HFA Inhale 2 puffs into the lungs 2 (two) times daily.   fluticasone 50 MCG/ACT nasal spray Commonly known as: FLONASE Place 2 sprays into both nostrils daily.   gabapentin 300 MG capsule Commonly known as: NEURONTIN TAKE 1 CAPSULE BY MOUTH 4  TIMES DAILY   glimepiride 4 MG tablet Commonly known as: AMARYL TAKE 1 TABLET BY MOUTH  TWICE A DAY   glucose blood test strip Commonly known as: ONE TOUCH ULTRA TEST USE TO TEST BLOOD SUGARS  TWO TIMES DAILY   HAIR SKIN AND NAILS FORMULA PO Take by mouth daily.   HumaLOG KwikPen 100 UNIT/ML KiwkPen Generic drug: insulin lispro 9 units with breakfast and lunch, 4 units at evening meal   Insulin Pen Needle 32G X 4 MM Misc Use 3 per day   Invokana 100 MG Tabs tablet Generic drug: canagliflozin Take 100 mg by mouth daily before breakfast.   latanoprost 0.005 % ophthalmic solution Commonly known as: XALATAN Place 1 drop  into both eyes at bedtime.   Levemir FlexPen 100 UNIT/ML Pen Generic drug: Insulin Detemir Inject 14 Units into the skin at bedtime.   levocetirizine 5 MG tablet Commonly known as: XYZAL TAKE 1 TABLET BY MOUTH  EVERY EVENING   levothyroxine 25 MCG tablet Commonly known as: SYNTHROID TAKE 1 TABLET BY MOUTH  DAILY BEFORE BREAKFAST   Melatonin 5 MG Tabs Take 1  tablet by mouth at bedtime as needed.   metFORMIN 1000 MG tablet Commonly known as: GLUCOPHAGE TAKE 1 TABLET BY MOUTH TWO  TIMES DAILY WITH A MEAL   metoprolol succinate 100 MG 24 hr tablet Commonly known as: TOPROL-XL TAKE 1 TABLET BY MOUTH  DAILY WITH OR IMMEDIATELY  FOLLOWING A MEAL   mirtazapine 7.5 MG tablet Commonly known as: REMERON TAKE 1 TABLET BY MOUTH AT  BEDTIME What changed:   when to take this  reasons to take this   montelukast 10 MG tablet Commonly known as: SINGULAIR TAKE 1 TABLET BY MOUTH AT  BEDTIME   niacin 500 MG CR tablet Commonly known as: NIASPAN Take 2 tablets (1,000 mg total) by mouth at bedtime.   NONFORMULARY OR COMPOUNDED ITEM Lumbar support belt  #1  As directed   nystatin ointment Commonly known as: MYCOSTATIN APPLY TOPICALLY TWO TIMES  DAILY AS NEEDED   ONE TOUCH ULTRA 2 w/Device Kit USE AS DIRECTED TO TEST  BLOOD SUGAR TWO TIMES DAILY   pantoprazole 40 MG tablet Commonly known as: PROTONIX TAKE 1 TABLET BY MOUTH  DAILY   timolol 0.5 % ophthalmic solution Commonly known as: BETIMOL Place 1 drop into both eyes 2 (two) times daily.   triamcinolone 0.1 % paste Commonly known as: KENALOG Use as directed 1 application in the mouth or throat 2 (two) times daily.   vitamin E 400 UNIT capsule Take 400 Units by mouth daily.       Allergies:  Allergies  Allergen Reactions   Ace Inhibitors Cough   Losartan Cough   Penicillins Hives   Sulfa Antibiotics Hives   Sulfonamide Derivatives     Past Medical History:  Diagnosis Date   Diabetes mellitus     Hyperlipidemia    Hypertension    Left rib fracture 02/18/2014    Past Surgical History:  Procedure Laterality Date   APPENDECTOMY     REFRACTIVE SURGERY     Left Eye   surgical sterilization  57 years ago    Family History  Problem Relation Age of Onset   Diabetes Mother    Hypertension Father    Depression Other    Cancer Other        laryngeal   Diabetes Other     Social History:  reports that she has never smoked. She has never used smokeless tobacco. She reports that she does not drink alcohol or use drugs.  Review of Systems:   HYPOTHYROIDISM:  She is has been mild and not clear if she had symptoms except mild hair loss Has been started on treatment by PCP and she feels fairly good now She has lost weight for other reasons  Continues to be on 25 g of levothyroxine TSH last year was normal  Lab Results  Component Value Date   TSH 2.33 10/29/2017   TSH 4.33 04/05/2017   TSH 2.28 02/04/2017   FREET4 1.06 06/19/2016   FREET4 1.12 06/10/2016   FREET4 0.93 10/17/2014     Hypertension:    Controlled with valsartan, blood pressure is followed by PCP  BP Readings from Last 3 Encounters:  08/23/18 118/70  08/08/18 118/60  07/22/18 137/69     Lab Results  Component Value Date   CREATININE 0.69 08/08/2018   BUN 12 08/08/2018   NA 137 08/08/2018   K 4.0 08/08/2018   CL 100 08/08/2018   CO2 27 08/08/2018    Lipids: Has had diabetic dyslipidemia,  well controlled and followed  by her PCP   Lab Results  Component Value Date   CHOL 113 12/21/2017   HDL 50.00 12/21/2017   LDLCALC 42 12/21/2017   TRIG 108.0 12/21/2017   CHOLHDL 2 12/21/2017     Diabetic foot exam done in 01/2017  Diabetic foot exam showed normal monofilament sensation in the toes and plantar surfaces, no skin lesions or ulcers on the feet and normal pedal pulses  Last eye exam documented on 02/26/2017     Examination:   BP 118/70    Pulse 86    Temp 97.8 F (36.6 C)  (Oral)    Ht '4\' 10"'$  (1.473 m)    Wt 130 lb 6.4 oz (59.1 kg)    SpO2 93%    BMI 27.25 kg/m   Body mass index is 27.25 kg/m.     ASSESSMENT/ PLAN:   Diabetes type 2 insulin requiring  See history of present illness for detailed discussion of  current management, blood sugar patterns and problems identified  Her A1c is 8% more recently  Since she has not checked her sugars after meals difficult to know when her blood sugars are running high Currently fasting blood sugars excellent She takes mealtime insulin but irregularly with her evening meal Also taking this postprandially She does need to check readings after meals and discussed that without doing this significant to adjust her mealtime dose  Also can do better with exercise   Recommendations:   She was reminded about checking her sugars in between meals and at least once a day after 1 of her meals  She is may need to reduce her Humalog if she is having relatively low readings postprandially  In the meantime since she has blood sugars as low as 67 fasting she will reduce her Levemir to 12 units  Encourage her to start walking outside also for exercise  No change in Invokana  She does need to take her insulin before starting to eat and also her Invokana before breakfast  HYPOTHYROIDISM: Needs follow-up TSH    Patient Instructions  Levemir 12 units  Take insulin 10 min before meals  Check blood sugars on waking up 3 days a week  Also check blood sugars about 2 hours after meals and do this after different meals by rotation  Recommended blood sugar levels on waking up are 90-130 and about 2 hours after meal is 130-160  Please bring your blood sugar monitor to each visit, thank you  Invokana before Bfst       Elayne Snare 08/23/2018, 2:29 PM

## 2018-08-29 ENCOUNTER — Other Ambulatory Visit: Payer: Self-pay | Admitting: Family

## 2018-09-01 ENCOUNTER — Telehealth: Payer: Self-pay | Admitting: Internal Medicine

## 2018-09-01 NOTE — Telephone Encounter (Signed)
LMTCB x1 for pt's husband.  °

## 2018-09-02 NOTE — Telephone Encounter (Signed)
Pt is calling back about the referral 

## 2018-09-02 NOTE — Telephone Encounter (Signed)
Spoke with the pt  She states that she has appt with ENT Dr Joya Gaskins on 09/05/2018  She states that the office is asking for "referral form"  PCC's is this something you guys can send them? It looks like maybe it was already done? Please help with this, thanks!

## 2018-09-02 NOTE — Telephone Encounter (Signed)
Called & spoke w/ pt regarding the following message from our [facility]:  "We have no referral form to be sent unless it's something new. I called there office and they do not need anything from [place]."   Pt verbalized understanding with no additional questions. Nothing further needed at this time.

## 2018-09-02 NOTE — Telephone Encounter (Signed)
lmtcb x2 

## 2018-09-02 NOTE — Telephone Encounter (Signed)
We have no referral form to be sent unless it's something new. I called there office and they do not need anything from [place].

## 2018-09-14 ENCOUNTER — Ambulatory Visit: Payer: Self-pay

## 2018-09-14 ENCOUNTER — Ambulatory Visit (INDEPENDENT_AMBULATORY_CARE_PROVIDER_SITE_OTHER): Payer: Medicare Other | Admitting: Family

## 2018-09-14 ENCOUNTER — Other Ambulatory Visit: Payer: Self-pay

## 2018-09-14 DIAGNOSIS — E1142 Type 2 diabetes mellitus with diabetic polyneuropathy: Secondary | ICD-10-CM

## 2018-09-14 DIAGNOSIS — L659 Nonscarring hair loss, unspecified: Secondary | ICD-10-CM

## 2018-09-14 DIAGNOSIS — E039 Hypothyroidism, unspecified: Secondary | ICD-10-CM

## 2018-09-14 DIAGNOSIS — S0300XA Dislocation of jaw, unspecified side, initial encounter: Secondary | ICD-10-CM

## 2018-09-14 NOTE — Progress Notes (Signed)
Virtual Visit via Video Note  I connected with Mary Flynn on 09/14/18 at  9:20 AM EDT by a video enabled telemedicine application and verified that I am speaking with the correct person using two identifiers.  Location: Patient: home Provider: home   I discussed the limitations of evaluation and management by telemedicine and the availability of in person appointments. The patient expressed understanding and agreed to proceed.   Attempted video visit but was unable to connect via facetime so we transitioned to phone call.  History of Present Illness:  Patient is an 84 yr old female who presents today for follow up.  DM2- reports sugar is running 90-low 100's.  Notes some elevated post prandial readings.  Lab Results  Component Value Date   HGBA1C 8.0 (A) 07/11/2018   HGBA1C 8.5 (H) 07/08/2018   HGBA1C 7.9 (H) 10/29/2017   Lab Results  Component Value Date   MICROALBUR 3.8 (H) 07/08/2018   LDLCALC 42 12/21/2017   CREATININE 0.69 08/08/2018   Lab Results  Component Value Date   TSH 2.33 10/29/2017   She reports that she had some left sided jaw pain.  Has some pain with chewing. Has trouble opening her mouth. Has previous hx of TMJ.   HTN- reports recent bp yesterday was OK.  Doesn't remember the reading but "house call" nurse came and checked it.    Past Medical History:  Diagnosis Date  . Diabetes mellitus   . Hyperlipidemia   . Hypertension   . Left rib fracture 02/18/2014     Social History   Socioeconomic History  . Marital status: Married    Spouse name: Malayasia Mirkin  . Number of children: 2  . Years of education: College  . Highest education level: Not on file  Occupational History  . Occupation: housewife  Social Needs  . Financial resource strain: Not on file  . Food insecurity    Worry: Not on file    Inability: Not on file  . Transportation needs    Medical: Not on file    Non-medical: Not on file  Tobacco Use  . Smoking status: Never Smoker   . Smokeless tobacco: Never Used  Substance and Sexual Activity  . Alcohol use: No  . Drug use: No  . Sexual activity: Not Currently  Lifestyle  . Physical activity    Days per week: Not on file    Minutes per session: Not on file  . Stress: Not on file  Relationships  . Social Herbalist on phone: Not on file    Gets together: Not on file    Attends religious service: Not on file    Active member of club or organization: Not on file    Attends meetings of clubs or organizations: Not on file    Relationship status: Not on file  . Intimate partner violence    Fear of current or ex partner: Not on file    Emotionally abused: Not on file    Physically abused: Not on file    Forced sexual activity: Not on file  Other Topics Concern  . Not on file  Social History Narrative   Patient lives at home with spouse.   Caffeine Use: rarely    Past Surgical History:  Procedure Laterality Date  . APPENDECTOMY    . REFRACTIVE SURGERY     Left Eye  . surgical sterilization  40 years ago    Family History  Problem Relation Age  of Onset  . Diabetes Mother   . Hypertension Father   . Depression Other   . Cancer Other        laryngeal  . Diabetes Other     Allergies  Allergen Reactions  . Ace Inhibitors Cough  . Losartan Cough  . Penicillins Hives  . Sulfa Antibiotics Hives  . Sulfonamide Derivatives     Current Outpatient Medications on File Prior to Visit  Medication Sig Dispense Refill  . acetaminophen-codeine 120-12 MG/5ML solution Take 2.5 mLs by mouth every 4 (four) hours as needed for moderate pain.    Marland Kitchen albuterol (VENTOLIN HFA) 108 (90 Base) MCG/ACT inhaler Inhale 2 puffs into the lungs every 6 (six) hours as needed for wheezing or shortness of breath. 1 Inhaler 5  . amLODipine (NORVASC) 10 MG tablet One half daily  0  . aspirin EC 81 MG tablet Take 81 mg by mouth daily.    Marland Kitchen atorvastatin (LIPITOR) 10 MG tablet TAKE 1 TABLET BY MOUTH  EVERY DAY 90 tablet 1   . b complex vitamins tablet Take 1 tablet by mouth daily.    . Blood Glucose Monitoring Suppl (ONE TOUCH ULTRA 2) w/Device KIT USE AS DIRECTED TO TEST  BLOOD SUGAR TWO TIMES DAILY 1 each 1  . calcium citrate-vitamin D (CITRACAL+D) 315-200 MG-UNIT tablet Take 1 tablet by mouth 2 (two) times daily.    . canagliflozin (INVOKANA) 100 MG TABS tablet Take 100 mg by mouth daily before breakfast.    . chlorpheniramine (CHLOR-TRIMETON) 4 MG tablet Take 4 mg by mouth every 4 (four) hours as needed for allergies.    . cyclobenzaprine (FLEXERIL) 10 MG tablet TAKE 1 TABLET BY MOUTH TWO  TIMES DAILY AS NEEDED FOR  MUSCLE SPASM 180 tablet 0  . DULoxetine (CYMBALTA) 30 MG capsule TAKE 1 CAPSULE BY MOUTH  DAILY 90 capsule 1  . famotidine (PEPCID) 20 MG tablet Take 1 tablet (20 mg total) by mouth at bedtime. 90 tablet 3  . fluconazole (DIFLUCAN) 150 MG tablet TAKE 1 TABLET BY MOUTH ONCE AS NEEDED FOR YEAST  INFECTION 3 tablet 0  . fluticasone (FLONASE) 50 MCG/ACT nasal spray Place 2 sprays into both nostrils daily. 16 g 6  . fluticasone (FLOVENT HFA) 110 MCG/ACT inhaler Inhale 2 puffs into the lungs 2 (two) times daily. 1 Inhaler 5  . gabapentin (NEURONTIN) 300 MG capsule TAKE 1 CAPSULE BY MOUTH 4  TIMES DAILY 360 capsule 0  . glimepiride (AMARYL) 4 MG tablet TAKE 1 TABLET BY MOUTH  TWICE A DAY 180 tablet 1  . glucose blood (ONE TOUCH ULTRA TEST) test strip USE TO TEST BLOOD SUGARS  TWO TIMES DAILY 200 each 1  . Insulin Detemir (LEVEMIR FLEXPEN) 100 UNIT/ML Pen Inject 14 Units into the skin at bedtime.  15 mL 3  . insulin lispro (HUMALOG KWIKPEN) 100 UNIT/ML KiwkPen 9 units with breakfast and lunch, 4 units at evening meal    . Insulin Pen Needle 32G X 4 MM MISC Use 3 per day 100 each 3  . latanoprost (XALATAN) 0.005 % ophthalmic solution Place 1 drop into both eyes at bedtime.    Marland Kitchen levocetirizine (XYZAL) 5 MG tablet TAKE 1 TABLET BY MOUTH  EVERY EVENING 90 tablet 1  . levothyroxine (SYNTHROID) 25 MCG tablet TAKE  1 TABLET BY MOUTH  DAILY BEFORE BREAKFAST 90 tablet 0  . Melatonin 5 MG TABS Take 1 tablet by mouth at bedtime as needed.    . metFORMIN (GLUCOPHAGE) 1000  MG tablet TAKE 1 TABLET BY MOUTH TWO  TIMES DAILY WITH A MEAL 180 tablet 1  . metoprolol succinate (TOPROL-XL) 100 MG 24 hr tablet TAKE 1 TABLET BY MOUTH  DAILY WITH OR IMMEDIATELY  FOLLOWING A MEAL 90 tablet 1  . mirtazapine (REMERON) 7.5 MG tablet TAKE 1 TABLET BY MOUTH AT  BEDTIME 90 tablet 1  . montelukast (SINGULAIR) 10 MG tablet TAKE 1 TABLET BY MOUTH AT  BEDTIME 90 tablet 1  . Multiple Vitamins-Minerals (HAIR SKIN AND NAILS FORMULA PO) Take by mouth daily.    . niacin (NIASPAN) 500 MG CR tablet Take 2 tablets (1,000 mg total) by mouth at bedtime. 90 tablet 1  . NONFORMULARY OR COMPOUNDED ITEM Lumbar support belt  #1  As directed 1 each 0  . nystatin ointment (MYCOSTATIN) APPLY TOPICALLY TWO TIMES  DAILY AS NEEDED 90 g 0  . pantoprazole (PROTONIX) 40 MG tablet TAKE 1 TABLET BY MOUTH  DAILY 90 tablet 1  . timolol (BETIMOL) 0.5 % ophthalmic solution Place 1 drop into both eyes 2 (two) times daily.    Marland Kitchen triamcinolone (KENALOG) 0.1 % paste Use as directed 1 application in the mouth or throat 2 (two) times daily. 15 g 1  . vitamin E 400 UNIT capsule Take 400 Units by mouth daily.     No current facility-administered medications on file prior to visit.     There were no vitals taken for this visit.     Observations/Objective:   Gen: Awake, alert, no acute distress Resp: Breathing is even and non-labored Psych: calm/pleasant demeanor Neuro: Alert and Oriented x 3, + facial symmetry, speech is clear.   Assessment and Plan:  1) HTN- reports stable. Continue current meds.  2) DM2- A1C is 8, acceptable for her age, being managed by endocrinology.  3) Hair loss- normal cbc, suggested that she schedule a lab visit for TSH check when she feels comfortable coming into the office.  Pt verbalizes understanding.   4) TMJ- hx most  consistent with TMJ.  Recommended trial of prn tylenol and office visit if symptoms worsen or fail to improve.   11 minutes spent on today's phone call.  Follow Up Instructions:    I discussed the assessment and treatment plan with the patient. The patient was provided an opportunity to ask questions and all were answered. The patient agreed with the plan and demonstrated an understanding of the instructions.   The patient was advised to call back or seek an in-person evaluation if the symptoms worsen or if the condition fails to improve as anticipated.  Nance Pear, NP

## 2018-09-14 NOTE — Telephone Encounter (Signed)
Incoming call from Patient stating. That she request they call [place] first time the call for virtual appointment. Office voices understanding.

## 2018-09-14 NOTE — Telephone Encounter (Signed)
Office visit completed

## 2018-09-15 ENCOUNTER — Telehealth: Payer: Self-pay | Admitting: Family

## 2018-09-15 NOTE — Telephone Encounter (Signed)
Mary Flynn I called Mary Flynn for a 3 month follow up. She stated she was going to [place] in Sept for 10 weeks and wanted to see you in August. I offered to make the appt when she returns but she insisted on seeing you in Aug. I told her I would call her back. Please advise

## 2018-09-15 NOTE — Telephone Encounter (Signed)
August is fine, Tks.

## 2018-09-23 ENCOUNTER — Other Ambulatory Visit: Payer: Self-pay

## 2018-09-23 ENCOUNTER — Ambulatory Visit (INDEPENDENT_AMBULATORY_CARE_PROVIDER_SITE_OTHER): Payer: Medicare Other | Admitting: Family

## 2018-09-23 ENCOUNTER — Encounter: Payer: Self-pay | Admitting: Family

## 2018-09-23 VITALS — BP 115/59 | HR 96 | Temp 98.0°F | Resp 16 | Wt 131.2 lb

## 2018-09-23 DIAGNOSIS — L309 Dermatitis, unspecified: Secondary | ICD-10-CM

## 2018-09-23 DIAGNOSIS — L659 Nonscarring hair loss, unspecified: Secondary | ICD-10-CM | POA: Diagnosis not present

## 2018-09-23 DIAGNOSIS — E039 Hypothyroidism, unspecified: Secondary | ICD-10-CM | POA: Diagnosis not present

## 2018-09-23 LAB — TSH: TSH: 2.56 u[IU]/mL (ref 0.35–4.50)

## 2018-09-23 MED ORDER — BETAMETHASONE VALERATE 0.1 % EX OINT: 1.0000 "application " | TOPICAL_OINTMENT | Freq: Two times a day (BID) | CUTANEOUS | 0 refills | Status: DC

## 2018-09-23 MED FILL — BETAMETHASONE VALER 0.1% OI: 0.1 | 15 days supply | Qty: 30 | Fill #0

## 2018-09-23 NOTE — Progress Notes (Signed)
Subjective:    Patient ID: Mary Flynn, female    DOB: 04/18/1934, 84 y.o.   MRN: [id number]  HPI  Patient is an 84 yr old female who presents today with chief complaint of bilateral elbow pain. Notes pain is greater in the right elbow than the left elbow. Patient reports that she has had a rash on her arm which is pruritis.    She complains of hair loss. Notes that when she brushes her hair "too much is falling out."  Lab Results  Component Value Date   TSH 2.33 10/29/2017    Review of Systems See HPI  Past Medical History:  Diagnosis Date   Diabetes mellitus    Hyperlipidemia    Hypertension    Left rib fracture 02/18/2014     Social History   Socioeconomic History   Marital status: Married    Spouse name: Martrice Apt   Number of children: 2   Years of education: College   Highest education level: Not on file  Occupational History   Occupation: housewife  Social Designer, fashion/clothing strain: Not on file   Food insecurity    Worry: Not on file    Inability: Not on file   Transportation needs    Medical: Not on file    Non-medical: Not on file  Tobacco Use   Smoking status: Never Smoker   Smokeless tobacco: Never Used  Substance and Sexual Activity   Alcohol use: No   Drug use: No   Sexual activity: Not Currently  Lifestyle   Physical activity    Days per week: Not on file    Minutes per session: Not on file   Stress: Not on file  Relationships   Social connections    Talks on phone: Not on file    Gets together: Not on file    Attends religious service: Not on file    Active member of club or organization: Not on file    Attends meetings of clubs or organizations: Not on file    Relationship status: Not on file   Intimate partner violence    Fear of current or ex partner: Not on file    Emotionally abused: Not on file    Physically abused: Not on file    Forced sexual activity: Not on file  Other Topics Concern   Not  on file  Social History Narrative   Patient lives at home with spouse.   Caffeine Use: rarely    Past Surgical History:  Procedure Laterality Date   APPENDECTOMY     REFRACTIVE SURGERY     Left Eye   surgical sterilization  22 years ago    Family History  Problem Relation Age of Onset   Diabetes Mother    Hypertension Father    Depression Other    Cancer Other        laryngeal   Diabetes Other     Allergies  Allergen Reactions   Ace Inhibitors Cough   Losartan Cough   Penicillins Hives   Sulfa Antibiotics Hives   Sulfonamide Derivatives     Current Outpatient Medications on File Prior to Visit  Medication Sig Dispense Refill   acetaminophen-codeine 120-12 MG/5ML solution Take 2.5 mLs by mouth every 4 (four) hours as needed for moderate pain.     albuterol (VENTOLIN HFA) 108 (90 Base) MCG/ACT inhaler Inhale 2 puffs into the lungs every 6 (six) hours as needed for wheezing or shortness of  breath. 1 Inhaler 5   amLODipine (NORVASC) 10 MG tablet One half daily  0   aspirin EC 81 MG tablet Take 81 mg by mouth daily.     atorvastatin (LIPITOR) 10 MG tablet TAKE 1 TABLET BY MOUTH  EVERY DAY 90 tablet 1   b complex vitamins tablet Take 1 tablet by mouth daily.     Blood Glucose Monitoring Suppl (ONE TOUCH ULTRA 2) w/Device KIT USE AS DIRECTED TO TEST  BLOOD SUGAR TWO TIMES DAILY 1 each 1   calcium citrate-vitamin D (CITRACAL+D) 315-200 MG-UNIT tablet Take 1 tablet by mouth 2 (two) times daily.     canagliflozin (INVOKANA) 100 MG TABS tablet Take 100 mg by mouth daily before breakfast.     chlorpheniramine (CHLOR-TRIMETON) 4 MG tablet Take 4 mg by mouth every 4 (four) hours as needed for allergies.     cyclobenzaprine (FLEXERIL) 10 MG tablet TAKE 1 TABLET BY MOUTH TWO  TIMES DAILY AS NEEDED FOR  MUSCLE SPASM 180 tablet 0   DULoxetine (CYMBALTA) 30 MG capsule TAKE 1 CAPSULE BY MOUTH  DAILY 90 capsule 1   famotidine (PEPCID) 20 MG tablet Take 1 tablet (20  mg total) by mouth at bedtime. 90 tablet 3   fluconazole (DIFLUCAN) 150 MG tablet TAKE 1 TABLET BY MOUTH ONCE AS NEEDED FOR YEAST  INFECTION 3 tablet 0   fluticasone (FLONASE) 50 MCG/ACT nasal spray Place 2 sprays into both nostrils daily. 16 g 6   fluticasone (FLOVENT HFA) 110 MCG/ACT inhaler Inhale 2 puffs into the lungs 2 (two) times daily. 1 Inhaler 5   gabapentin (NEURONTIN) 300 MG capsule TAKE 1 CAPSULE BY MOUTH 4  TIMES DAILY 360 capsule 0   glimepiride (AMARYL) 4 MG tablet TAKE 1 TABLET BY MOUTH  TWICE A DAY 180 tablet 1   glucose blood (ONE TOUCH ULTRA TEST) test strip USE TO TEST BLOOD SUGARS  TWO TIMES DAILY 200 each 1   Insulin Detemir (LEVEMIR FLEXPEN) 100 UNIT/ML Pen Inject 14 Units into the skin at bedtime.  15 mL 3   insulin lispro (HUMALOG KWIKPEN) 100 UNIT/ML KiwkPen 9 units with breakfast and lunch, 4 units at evening meal     Insulin Pen Needle 32G X 4 MM MISC Use 3 per day 100 each 3   latanoprost (XALATAN) 0.005 % ophthalmic solution Place 1 drop into both eyes at bedtime.     levocetirizine (XYZAL) 5 MG tablet TAKE 1 TABLET BY MOUTH  EVERY EVENING 90 tablet 1   levothyroxine (SYNTHROID) 25 MCG tablet TAKE 1 TABLET BY MOUTH  DAILY BEFORE BREAKFAST 90 tablet 0   Melatonin 5 MG TABS Take 1 tablet by mouth at bedtime as needed.     metFORMIN (GLUCOPHAGE) 1000 MG tablet TAKE 1 TABLET BY MOUTH TWO  TIMES DAILY WITH A MEAL 180 tablet 1   metoprolol succinate (TOPROL-XL) 100 MG 24 hr tablet TAKE 1 TABLET BY MOUTH  DAILY WITH OR IMMEDIATELY  FOLLOWING A MEAL 90 tablet 1   mirtazapine (REMERON) 7.5 MG tablet TAKE 1 TABLET BY MOUTH AT  BEDTIME 90 tablet 1   montelukast (SINGULAIR) 10 MG tablet TAKE 1 TABLET BY MOUTH AT  BEDTIME 90 tablet 1   Multiple Vitamins-Minerals (HAIR SKIN AND NAILS FORMULA PO) Take by mouth daily.     niacin (NIASPAN) 500 MG CR tablet Take 2 tablets (1,000 mg total) by mouth at bedtime. 90 tablet 1   NONFORMULARY OR COMPOUNDED ITEM Lumbar  support belt  #1  As directed 1 each 0   nystatin ointment (MYCOSTATIN) APPLY TOPICALLY TWO TIMES  DAILY AS NEEDED 90 g 0   pantoprazole (PROTONIX) 40 MG tablet TAKE 1 TABLET BY MOUTH  DAILY 90 tablet 1   timolol (BETIMOL) 0.5 % ophthalmic solution Place 1 drop into both eyes 2 (two) times daily.     triamcinolone (KENALOG) 0.1 % paste Use as directed 1 application in the mouth or throat 2 (two) times daily. 15 g 1   vitamin E 400 UNIT capsule Take 400 Units by mouth daily.     No current facility-administered medications on file prior to visit.     BP (!) 115/59 (BP Location: Right Arm, Patient Position: Sitting, Cuff Size: Small)    Pulse 96    Temp 98 F (36.7 C) (Oral)    Resp 16    Wt 131 lb 3.2 oz (59.5 kg)    SpO2 97%    BMI 27.42 kg/m       Objective:   Physical Exam Constitutional:      Appearance: She is well-developed.  Cardiovascular:     Rate and Rhythm: Normal rate and regular rhythm.     Heart sounds: Normal heart sounds. No murmur.  Pulmonary:     Effort: Pulmonary effort is normal. No respiratory distress.     Breath sounds: Normal breath sounds. No wheezing.  Musculoskeletal:     Comments: No olecranon tenderness/swelling. She has some tenderness in the surrounding soft tissue. No erythema, no induration  Skin:    General: Skin is warm.     Comments: Mild hyperpigmented rash right AC fossa.   Scalp hair is thin but no bare areas  Psychiatric:        Behavior: Behavior normal.        Thought Content: Thought content normal.        Judgment: Judgment normal.           Assessment & Plan:  Eczema- will rx with betamethasone.  Hair loss- refer to dermatology for further evaluation. CBC was normal 1 month ago. Obtain follow up TSH.  Elbow pain- etiology unclear, ? OA.  Advised tylenol prn and to let me know if symptoms worsen.

## 2018-09-23 NOTE — Patient Instructions (Addendum)
You may use tylenol as needed for joint pain. Apply betamethasone cream to rash on your arm. You should be contacted about your referral to dermatology. Please complete lab work prior to leaving.

## 2018-10-05 ENCOUNTER — Telehealth: Payer: Self-pay | Admitting: Internal Medicine

## 2018-10-05 NOTE — Telephone Encounter (Signed)
Call returned to patient, made aware of MW recommendations. Voiced understanding. Nothing further is needed at this time.

## 2018-10-05 NOTE — Telephone Encounter (Signed)
Yes should try to get by with just the 5 mg dose and elevate and if bp or swelling not better to her satisfaction then should call her PCP for her input and f/u

## 2018-10-05 NOTE — Telephone Encounter (Signed)
Called and spoke to pt. Pt states she has notice an increase in lower left leg swelling that started today. Inquired if pt is still taking the 5mg  of Amlodipine as advised by Dr. Melvyn Novas at the last [place] in 07/2018, pt states she actually resumed the 10mg  daily about 5-7 days ago. Pt also states her pressure was good while she was on the 5mg  of Amlodipine and she didn't have any swelling. Advised pt to go back to the 5mg  Amlodipine and to monitor her pressure until she comes back into the office on 10/11/18. Advised pt I will let MW know and advised on recs and will call back if needed. Pt also aware to call us back if she has any changes in her s/s. Pt denied SOB, CP/tightness, f/c/s, other swelling, orthopnea, and overall states shes doing well. Dr. Melvyn Novas please advise. Thanks.    Instructions from 08/08/2018: Instructions  Reduce amlodipine to 10 mg one half daily to see if helps your swelling   Stop Vitamin  E to see what difference it makes   Keep appt with Dr Joya Gaskins for your sensation of throat fullness since childhood   Please remember to go to the lab and x-ray department   for your tests - we will call you with the results when they are available.    Please schedule a follow up office visit in 2 weeks , call sooner if needed with all medications /inhalers/ solutions in hand so we can verify exactly what you are taking. This includes all medications from all doctors and over the [place] separate them into two bags:  the ones you take automatically, no matter what, vs the ones you take just when you feel you need them "BAG #2 is UP TO YOU"  - this will really help [place] help you take your medications more effectively.

## 2018-10-06 LAB — HM MAMMOGRAPHY

## 2018-10-07 ENCOUNTER — Ambulatory Visit: Payer: Medicare Other | Admitting: Family

## 2018-10-11 ENCOUNTER — Ambulatory Visit: Payer: Medicare Other | Admitting: Internal Medicine

## 2018-10-14 ENCOUNTER — Other Ambulatory Visit: Payer: Self-pay | Admitting: Family

## 2018-10-19 ENCOUNTER — Telehealth: Payer: Self-pay | Admitting: Family

## 2018-10-19 MED ORDER — AMLODIPINE BESYLATE 10 MG PO TABS: 5.0000 mg | ORAL_TABLET | Freq: Every day | ORAL | 3 refills | Status: DC

## 2018-10-19 NOTE — Telephone Encounter (Signed)
She can change amlodipine to 5mg  once daily (take 1/2 tab of the 10mg ). Check blood pressure once daily for 1 week and then call me with her blood pressure readings and update on her leg swelling progress.

## 2018-10-19 NOTE — Telephone Encounter (Signed)
Pt would like to change the  amLODipine (NORVASC) 10 MG tablet  To a 5 MG amlodipine.   90 tab Pt states Dr Melvyn Novas office called and asked to to have this med changed.  [place], [place] [employer] [phone] (Phone) [phone] (Fax)

## 2018-10-20 MED FILL — TRIAMCINOLONE 0.1% OINTMEN: 0.1 | 14 days supply | Qty: 15 | Fill #0

## 2018-10-20 MED FILL — KETOCONAZOLE 2% SHAMPOO: 2 | 14 days supply | Qty: 120 | Fill #0

## 2018-10-20 NOTE — Telephone Encounter (Signed)
Per patient she can not cut the 10 mg tablet because is too small and it always brakes into a few pieces. She started to take his husband's 5 mg tablets for the last 3-4 weeks.  She reports swelling of  Legs has been better and bp has been arround 119/70.

## 2018-10-21 MED ORDER — AMLODIPINE BESYLATE 5 MG PO TABS: 5.0000 mg | ORAL_TABLET | Freq: Every day | ORAL | 1 refills | Status: DC

## 2018-10-21 NOTE — Telephone Encounter (Signed)
Patient advised rx was sent to her mail ourder

## 2018-10-21 NOTE — Telephone Encounter (Signed)
I sent a 90 day supply to [facility] for the 5mg  tabs.

## 2018-10-24 ENCOUNTER — Ambulatory Visit: Payer: Medicare Other | Admitting: Cardiology

## 2018-10-25 ENCOUNTER — Ambulatory Visit: Payer: Medicare Other | Admitting: Endocrinology

## 2018-11-16 ENCOUNTER — Other Ambulatory Visit: Payer: Self-pay

## 2018-11-16 ENCOUNTER — Encounter: Payer: Self-pay | Admitting: Cardiology

## 2018-11-16 ENCOUNTER — Ambulatory Visit (INDEPENDENT_AMBULATORY_CARE_PROVIDER_SITE_OTHER): Payer: Medicare Other | Admitting: Cardiology

## 2018-11-16 VITALS — BP 138/64 | HR 95 | Temp 95.5°F | Ht <= 58 in | Wt 127.0 lb

## 2018-11-16 DIAGNOSIS — E1151 Type 2 diabetes mellitus with diabetic peripheral angiopathy without gangrene: Secondary | ICD-10-CM | POA: Diagnosis not present

## 2018-11-16 DIAGNOSIS — I10 Essential (primary) hypertension: Secondary | ICD-10-CM

## 2018-11-16 DIAGNOSIS — E1165 Type 2 diabetes mellitus with hyperglycemia: Secondary | ICD-10-CM

## 2018-11-16 DIAGNOSIS — IMO0002 Reserved for concepts with insufficient information to code with codable children: Secondary | ICD-10-CM

## 2018-11-16 NOTE — Patient Instructions (Signed)
Medication Instructions:  Your physician recommends that you continue on your current medications as directed. Please refer to the Current Medication list given to you today.  If you need a refill on your cardiac medications before your next appointment, please call your pharmacy.   Lab work: NONE If you have labs (blood work) drawn today and your tests are completely normal, you will receive your results only by: . MyChart Message (if you have MyChart) OR . A paper copy in the mail If you have any lab test that is abnormal or we need to change your treatment, we will call you to review the results.  Testing/Procedures: You had an EKG performed today  Follow-Up: At [facility], you and your health needs are our priority.  As part of our continuing mission to provide you with exceptional heart care, we have created designated Provider Care Teams.  These Care Teams include your primary Cardiologist (physician) and Advanced Practice Providers (APPs -  Physician Assistants and Nurse Practitioners) who all work together to provide you with the care you need, when you need it. You will need a follow up appointment in 6 months.   

## 2018-11-16 NOTE — Progress Notes (Signed)
Cardiology Office Note:    Date:  11/16/2018   ID:  [name], DOB November 07, 1934, MRN [id number]  PCP:  [name], NP  Cardiologist:  Jenean Lindau, MD   Referring MD: [name], NP    ASSESSMENT:    1. Essential hypertension   2. DM (diabetes mellitus) type II uncontrolled, periph vascular disorder ([place])    PLAN:    In order of problems listed above:  1. Dyspnea on exertion: This is been persisting for a very long time and is steady.  She leads a sedentary lifestyle.  Importance of activity was stressed with the patient.  I suggested evaluation for cardiac reasons but the patient mentions to me that for similar reasons testing was done in the past and it was negative and she is going to start walking on a regular basis and build her effort tolerance up.  She does not want any testing such as stress testing at this time.  I respect her wishes. 2. Essential hypertension: Her blood pressure is stable and diet was discussed 3. Mixed dyslipidemia: Diet was discussed.  She has a physical coming up with her primary care physician in the next few days and will get lipids checked 4. Patient will be seen in follow-up appointment in 6 months or earlier if the patient has any concerns.    Medication Adjustments/Labs and Tests Ordered: Current medicines are reviewed at length with the patient today.  Concerns regarding medicines are outlined above.  No orders of the defined types were placed in this encounter.  No orders of the defined types were placed in this encounter.    Chief Complaint  Patient presents with  . Follow-up     History of Present Illness:    Mary Flynn is a 84 y.o. female.  Patient has past medical history of essential hypertension, diabetes mellitus and mixed dyslipidemia.  She has some issues with shortness of breath which have been present for a very long.  Of time.  She is under the care of pulmonologist and feels better.  She leads a  sedentary lifestyle.  No chest pain orthopnea or PND.  At the time of my evaluation, the patient is alert awake oriented and in no distress.  Past Medical History:  Diagnosis Date  . Diabetes mellitus   . Hyperlipidemia   . Hypertension   . Left rib fracture 02/18/2014    Past Surgical History:  Procedure Laterality Date  . APPENDECTOMY    . REFRACTIVE SURGERY     Left Eye  . surgical sterilization  40 years ago    Current Medications: Current Meds  Medication Sig  . amLODipine (NORVASC) 5 MG tablet Take 1 tablet (5 mg total) by mouth daily.  Marland Kitchen aspirin EC 81 MG tablet Take 81 mg by mouth daily.  Marland Kitchen atorvastatin (LIPITOR) 10 MG tablet TAKE 1 TABLET BY MOUTH  EVERY DAY  . b complex vitamins tablet Take 1 tablet by mouth daily.  . Blood Glucose Monitoring Suppl (ONE TOUCH ULTRA 2) w/Device KIT USE AS DIRECTED TO TEST  BLOOD SUGAR TWO TIMES DAILY  . canagliflozin (INVOKANA) 100 MG TABS tablet Take 100 mg by mouth daily before breakfast.  . cyclobenzaprine (FLEXERIL) 10 MG tablet TAKE 1 TABLET BY MOUTH TWO  TIMES DAILY AS NEEDED FOR  MUSCLE SPASM  . DULoxetine (CYMBALTA) 30 MG capsule TAKE 1 CAPSULE BY MOUTH  DAILY  . famotidine (PEPCID) 20 MG tablet Take 1 tablet (20 mg total) by  mouth at bedtime.  . fluconazole (DIFLUCAN) 150 MG tablet TAKE 1 TABLET BY MOUTH ONCE AS NEEDED FOR YEAST  INFECTION  . fluticasone (FLONASE) 50 MCG/ACT nasal spray Place 2 sprays into both nostrils daily.  . fluticasone (FLOVENT HFA) 110 MCG/ACT inhaler Inhale 2 puffs into the lungs 2 (two) times daily.  Marland Kitchen gabapentin (NEURONTIN) 300 MG capsule TAKE 1 CAPSULE BY MOUTH 4  TIMES DAILY  . glimepiride (AMARYL) 4 MG tablet TAKE 1 TABLET BY MOUTH  TWICE A DAY  . glucose blood (ONE TOUCH ULTRA TEST) test strip USE TO TEST BLOOD SUGARS  TWO TIMES DAILY  . Insulin Detemir (LEVEMIR FLEXPEN) 100 UNIT/ML Pen Inject 14 Units into the skin at bedtime.   . insulin lispro (HUMALOG KWIKPEN) 100 UNIT/ML KiwkPen 9 units with  breakfast and lunch, 4 units at evening meal  . Insulin Pen Needle 32G X 4 MM MISC Use 3 per day  . latanoprost (XALATAN) 0.005 % ophthalmic solution Place 1 drop into both eyes at bedtime.  Marland Kitchen levocetirizine (XYZAL) 5 MG tablet TAKE 1 TABLET BY MOUTH  EVERY EVENING  . levothyroxine (SYNTHROID) 25 MCG tablet TAKE 1 TABLET BY MOUTH  DAILY BEFORE BREAKFAST  . metFORMIN (GLUCOPHAGE) 1000 MG tablet TAKE 1 TABLET BY MOUTH TWO  TIMES DAILY WITH A MEAL  . metoprolol succinate (TOPROL-XL) 100 MG 24 hr tablet TAKE 1 TABLET BY MOUTH  DAILY WITH OR IMMEDIATELY  FOLLOWING A MEAL  . montelukast (SINGULAIR) 10 MG tablet TAKE 1 TABLET BY MOUTH AT  BEDTIME  . Multiple Vitamins-Minerals (HAIR SKIN AND NAILS FORMULA PO) Take by mouth daily.  . niacin (NIASPAN) 500 MG CR tablet Take 2 tablets (1,000 mg total) by mouth at bedtime.  . NONFORMULARY OR COMPOUNDED ITEM Lumbar support belt  #1  As directed  . nystatin ointment (MYCOSTATIN) APPLY TOPICALLY TWO TIMES  DAILY AS NEEDED  . pantoprazole (PROTONIX) 40 MG tablet TAKE 1 TABLET BY MOUTH  DAILY  . timolol (BETIMOL) 0.5 % ophthalmic solution Place 1 drop into both eyes 2 (two) times daily.  Marland Kitchen triamcinolone (KENALOG) 0.1 % paste Use as directed 1 application in the mouth or throat 2 (two) times daily.  . vitamin E 400 UNIT capsule Take 400 Units by mouth daily.  . [DISCONTINUED] acetaminophen-codeine 120-12 MG/5ML solution Take 2.5 mLs by mouth every 4 (four) hours as needed for moderate pain.     Allergies:   Ace inhibitors, Losartan, Penicillins, Sulfa antibiotics, and Sulfonamide derivatives   Social History   Socioeconomic History  . Marital status: Married    Spouse name: Heiress Williamson  . Number of children: 2  . Years of education: College  . Highest education level: Not on file  Occupational History  . Occupation: housewife  Social Needs  . Financial resource strain: Not on file  . Food insecurity    Worry: Not on file    Inability: Not on file   . Transportation needs    Medical: Not on file    Non-medical: Not on file  Tobacco Use  . Smoking status: Never Smoker  . Smokeless tobacco: Never Used  Substance and Sexual Activity  . Alcohol use: No  . Drug use: No  . Sexual activity: Not Currently  Lifestyle  . Physical activity    Days per week: Not on file    Minutes per session: Not on file  . Stress: Not on file  Relationships  . Social Herbalist on phone: Not  on file    Gets together: Not on file    Attends religious service: Not on file    Active member of club or organization: Not on file    Attends meetings of clubs or organizations: Not on file    Relationship status: Not on file  Other Topics Concern  . Not on file  Social History Narrative   Patient lives at home with spouse.   Caffeine Use: rarely     Family History: The patient's family history includes Cancer in an other family member; Depression in an other family member; Diabetes in her mother and another family member; Hypertension in her father.  ROS:   Please see the history of present illness.    All other systems reviewed and are negative.  EKGs/Labs/Other Studies Reviewed:    The following studies were reviewed today:  EKG reveals sinus rhythm and nonspecific ST-T changes.   IMPRESSIONS    1. The left ventricle has hyperdynamic systolic function, with an ejection fraction of >65%. The cavity size was normal. There is mildly increased left ventricular wall thickness. Indeterminate diastolic filling due to E-A fusion.  2. The right ventricle has normal systolic function. The cavity was normal. There is no increase in right ventricular wall thickness.  3. The mitral valve is degenerative. Severe thickening of the mitral valve leaflet. Severe calcification of the mitral valve leaflet.  4. Severe MAC with MV leaflet thickening Small gradient in diastole but no MS by PT1/2.  5. The aortic valve is tricuspid. Moderate thickening of  the aortic valve. Sclerosis without any evidence of stenosis of the aortic valve.   Recent Labs: 07/08/2018: ALT 15 08/08/2018: BUN 12; Creatinine, Ser 0.69; Hemoglobin 12.6; Platelets 370.0; Potassium 4.0; Pro B Natriuretic peptide (BNP) 86.0; Sodium 137 09/23/2018: TSH 2.56  Recent Lipid Panel    Component Value Date/Time   CHOL 113 12/21/2017 0747   TRIG 108.0 12/21/2017 0747   HDL 50.00 12/21/2017 0747   CHOLHDL 2 12/21/2017 0747   VLDL 21.6 12/21/2017 0747   LDLCALC 42 12/21/2017 0747    Physical Exam:    VS:  BP 138/64 (BP Location: Left Arm, Patient Position: Sitting, Cuff Size: Normal)   Pulse 95   Temp (!) 95.5 F (35.3 C)   Ht _0  (1.473 m)   Wt 127 lb (57.6 kg)   SpO2 95%   BMI 26.54 kg/m     Wt Readings from Last 3 Encounters:  11/16/18 127 lb (57.6 kg)  09/23/18 131 lb 3.2 oz (59.5 kg)  08/23/18 130 lb 6.4 oz (59.1 kg)     GEN: Patient is in no acute distress HEENT: Normal NECK: No JVD; No carotid bruits LYMPHATICS: No lymphadenopathy CARDIAC: Hear sounds regular, 2/6 systolic murmur at the apex. RESPIRATORY:  Clear to auscultation without rales, wheezing or rhonchi  ABDOMEN: Soft, non-tender, non-distended MUSCULOSKELETAL:  No edema; No deformity  SKIN: Warm and dry NEUROLOGIC:  Alert and oriented x 3 PSYCHIATRIC:  Normal affect   Signed, Jenean Lindau, MD  11/16/2018 10:06 AM    [place]

## 2018-11-18 ENCOUNTER — Ambulatory Visit (INDEPENDENT_AMBULATORY_CARE_PROVIDER_SITE_OTHER): Payer: Medicare Other

## 2018-11-18 ENCOUNTER — Other Ambulatory Visit: Payer: Self-pay

## 2018-11-18 DIAGNOSIS — Z23 Encounter for immunization: Secondary | ICD-10-CM

## 2018-11-18 NOTE — Progress Notes (Signed)
Here for flu shot

## 2018-12-01 ENCOUNTER — Other Ambulatory Visit: Payer: Self-pay | Admitting: Family

## 2018-12-01 ENCOUNTER — Other Ambulatory Visit: Payer: Self-pay | Admitting: Internal Medicine

## 2018-12-08 ENCOUNTER — Other Ambulatory Visit: Payer: Self-pay

## 2018-12-08 ENCOUNTER — Other Ambulatory Visit (INDEPENDENT_AMBULATORY_CARE_PROVIDER_SITE_OTHER): Payer: Medicare Other

## 2018-12-08 DIAGNOSIS — E063 Autoimmune thyroiditis: Secondary | ICD-10-CM

## 2018-12-08 DIAGNOSIS — E1165 Type 2 diabetes mellitus with hyperglycemia: Secondary | ICD-10-CM

## 2018-12-08 DIAGNOSIS — Z794 Long term (current) use of insulin: Secondary | ICD-10-CM

## 2018-12-08 LAB — COMPREHENSIVE METABOLIC PANEL
ALT: 18 U/L (ref 0–35)
AST: 20 U/L (ref 0–37)
Albumin: 4.5 g/dL (ref 3.5–5.2)
Alkaline Phosphatase: 60 U/L (ref 39–117)
BUN: 14 mg/dL (ref 6–23)
CO2: 27 mEq/L (ref 19–32)
Calcium: 9.4 mg/dL (ref 8.4–10.5)
Chloride: 100 mEq/L (ref 96–112)
Creatinine, Ser: 0.65 mg/dL (ref 0.40–1.20)
GFR: 87.23 mL/min (ref >60.00)
Glucose, Bld: 90 mg/dL (ref 70–99)
Potassium: 4.3 mEq/L (ref 3.5–5.1)
Sodium: 136 mEq/L (ref 135–145)
Total Bilirubin: 1.3 mg/dL — ABNORMAL HIGH (ref 0.2–1.2)
Total Protein: 6.9 g/dL (ref 6.0–8.3)

## 2018-12-08 LAB — LIPID PANEL
Cholesterol: 110 mg/dL (ref 0–200)
HDL: 49.2 mg/dL (ref >39.00)
LDL Cholesterol: 43 mg/dL (ref 0–99)
NonHDL: 60.49
Total CHOL/HDL Ratio: 2
Triglycerides: 88 mg/dL (ref 0.0–149.0)
VLDL: 17.6 mg/dL (ref 0.0–40.0)

## 2018-12-08 LAB — T4, FREE: Free T4: 1.17 ng/dL (ref 0.60–1.60)

## 2018-12-08 LAB — HEMOGLOBIN A1C: Hgb A1c MFr Bld: 7.6 % — ABNORMAL HIGH (ref 4.6–6.5)

## 2018-12-08 LAB — TSH: TSH: 2.73 u[IU]/mL (ref 0.35–4.50)

## 2018-12-09 ENCOUNTER — Other Ambulatory Visit: Payer: Medicare Other

## 2018-12-12 ENCOUNTER — Encounter: Payer: Self-pay | Admitting: Endocrinology

## 2018-12-12 ENCOUNTER — Ambulatory Visit (INDEPENDENT_AMBULATORY_CARE_PROVIDER_SITE_OTHER): Payer: Medicare Other | Admitting: Endocrinology

## 2018-12-12 ENCOUNTER — Other Ambulatory Visit: Payer: Self-pay

## 2018-12-12 VITALS — BP 128/68 | HR 91 | Ht <= 58 in | Wt 127.4 lb

## 2018-12-12 DIAGNOSIS — E782 Mixed hyperlipidemia: Secondary | ICD-10-CM

## 2018-12-12 DIAGNOSIS — E063 Autoimmune thyroiditis: Secondary | ICD-10-CM

## 2018-12-12 DIAGNOSIS — E1165 Type 2 diabetes mellitus with hyperglycemia: Secondary | ICD-10-CM | POA: Diagnosis not present

## 2018-12-12 DIAGNOSIS — Z794 Long term (current) use of insulin: Secondary | ICD-10-CM

## 2018-12-12 NOTE — Progress Notes (Signed)
Patient ID: Mary Flynn, female   DOB: April 06, 1934, 84 y.o.   MRN: [id number]   Reason for Appointment: Diabetes and general follow-up   History of Present Illness   Diagnosis: Type 2 DIABETES MELITUS, date of diagnosis:   1995    Previous history: She has been on various regimens of hypoglycemic drugs over the last several years including Actos, Amaryl and Januvia. Because of cost her Januvia was stopped and Actos was stopped because of potential weight gain She was initially started on Levemir insulin because of fasting hyperglycemia in 2009 and the dose has been adjusted periodically In the last year she is also quite mealtime coverage because of postprandial hyperglycemia despite usually eating a low carbohydrate diet   Recent history:   Insulin regimen: Levemir 12 units hs;  Humalog 8 at breakfast, 8 at lunch and 4 acs  Oral hypoglycemic drugs: Amaryl 2 mg twice a day, metformin, Invokana '100mg'$  in am  Current blood sugar patterns and problems identified: Her last A1c was 8% and is now 7.6   She has been checking her blood sugars less frequently and again only in the mornings  Despite reminders she only checks blood sugars when she wakes up  She says she has cut back her Levemir by 2 units a few weeks ago because of tendency to low sugars  However she still thinks that she will have some symptoms of weakness which she attributes to low blood sugar during the day especially if she is a little late for lunch.  She does not check her sugar at that time  She does not appear to be adjusting her mealtime dose based on how much she has eaten  Again she will take her Humalog after finishing eating and not before  In the last 2 days her blood sugars have been frequently in the 80s fasting  She tries to walk when she is not having leg or back pains  Probably with smaller portions she is able to lose a little weight     Side effects from medications: None.            Monitors blood glucose: Once  a day.    Glucometer:  One Touch ultra 2        Blood Glucose readings from meter download:    PRE-MEAL Fasting Lunch Dinner Bedtime Overall  Glucose range:  75-181      Mean/median:  111     111   Previous readings: FASTING range 67-188 with MEDIAN 108   Meals: 3 meals per day she is a vegetarian. Trying to get some protein at most meals including dairy products and tofu  Has breakfast at 10 am,  lunch around 2 PM, dinner at 8 PM           Last visit with dietitian: 2009   Wt Readings from Last 3 Encounters:  12/12/18 127 lb 6.4 oz (57.8 kg)  11/16/18 127 lb (57.6 kg)  09/23/18 131 lb 3.2 oz (59.5 kg)    LABS:  Lab Results  Component Value Date   HGBA1C 7.6 (H) 12/08/2018   HGBA1C 8.0 (A) 07/11/2018   HGBA1C 8.5 (H) 07/08/2018   Lab Results  Component Value Date   MICROALBUR 3.8 (H) 07/08/2018   [place] 43 12/08/2018   CREATININE 0.65 12/08/2018    Other issues discussed today: See review of systems   Allergies as of 12/12/2018      Reactions   Ace Inhibitors Cough  Losartan Cough   Penicillins Hives   Sulfa Antibiotics Hives   Sulfonamide Derivatives       Medication List       Accurate as of December 12, 2018 10:53 AM. If you have any questions, ask your nurse or doctor.        amLODipine 5 MG tablet Commonly known as: NORVASC Take 1 tablet (5 mg total) by mouth daily.   aspirin EC 81 MG tablet Take 81 mg by mouth daily.   atorvastatin 10 MG tablet Commonly known as: LIPITOR TAKE 1 TABLET BY MOUTH  EVERY DAY   b complex vitamins tablet Take 1 tablet by mouth daily.   cyclobenzaprine 10 MG tablet Commonly known as: FLEXERIL TAKE 1 TABLET BY MOUTH TWO  TIMES DAILY AS NEEDED FOR  MUSCLE SPASM   DULoxetine 30 MG capsule Commonly known as: CYMBALTA TAKE 1 CAPSULE BY MOUTH  DAILY   famotidine 20 MG tablet Commonly known as: Pepcid Take 1 tablet (20 mg total) by mouth at bedtime.   fluconazole 150 MG  tablet Commonly known as: DIFLUCAN TAKE 1 TABLET BY MOUTH ONCE AS NEEDED FOR YEAST  INFECTION   fluticasone 110 MCG/ACT inhaler Commonly known as: Flovent HFA Inhale 2 puffs into the lungs 2 (two) times daily.   fluticasone 50 MCG/ACT nasal spray Commonly known as: FLONASE Place 2 sprays into both nostrils daily.   gabapentin 300 MG capsule Commonly known as: NEURONTIN TAKE 1 CAPSULE BY MOUTH 4  TIMES DAILY   glimepiride 4 MG tablet Commonly known as: AMARYL TAKE 1 TABLET BY MOUTH  TWICE A DAY   glucose blood test strip Commonly known as: ONE TOUCH ULTRA TEST USE TO TEST BLOOD SUGARS  TWO TIMES DAILY   HAIR SKIN AND NAILS FORMULA PO Take by mouth daily.   HumaLOG KwikPen 100 UNIT/ML KiwkPen Generic drug: insulin lispro 8 units with breakfast and lunch, 4 units at evening meal   Insulin Pen Needle 32G X 4 MM Misc Use 3 per day   Invokana 100 MG Tabs tablet Generic drug: canagliflozin Take 100 mg by mouth daily before breakfast.   latanoprost 0.005 % ophthalmic solution Commonly known as: XALATAN Place 1 drop into both eyes at bedtime.   Levemir FlexPen 100 UNIT/ML Pen Generic drug: Insulin Detemir Inject 12 Units into the skin at bedtime.   levocetirizine 5 MG tablet Commonly known as: XYZAL TAKE 1 TABLET BY MOUTH  EVERY EVENING   levothyroxine 25 MCG tablet Commonly known as: SYNTHROID TAKE 1 TABLET BY MOUTH  DAILY BEFORE BREAKFAST   metFORMIN 1000 MG tablet Commonly known as: GLUCOPHAGE TAKE 1 TABLET BY MOUTH TWO  TIMES DAILY WITH A MEAL   metoprolol succinate 100 MG 24 hr tablet Commonly known as: TOPROL-XL TAKE 1 TABLET BY MOUTH  DAILY WITH OR IMMEDIATELY  FOLLOWING A MEAL   montelukast 10 MG tablet Commonly known as: SINGULAIR TAKE 1 TABLET BY MOUTH AT  BEDTIME   niacin 500 MG CR tablet Commonly known as: NIASPAN Take 2 tablets (1,000 mg total) by mouth at bedtime.   NONFORMULARY OR COMPOUNDED ITEM Lumbar support belt  #1  As directed     nystatin ointment Commonly known as: MYCOSTATIN APPLY TOPICALLY TWO TIMES  DAILY AS NEEDED   ONE TOUCH ULTRA 2 w/Device Kit USE AS DIRECTED TO TEST  BLOOD SUGAR TWO TIMES DAILY   pantoprazole 40 MG tablet Commonly known as: PROTONIX TAKE 1 TABLET BY MOUTH  DAILY   timolol 0.5 %  ophthalmic solution Commonly known as: BETIMOL Place 1 drop into both eyes 2 (two) times daily.   triamcinolone 0.1 % paste Commonly known as: KENALOG Use as directed 1 application in the mouth or throat 2 (two) times daily.   vitamin E 400 UNIT capsule Take 400 Units by mouth daily.       Allergies:  Allergies  Allergen Reactions   Ace Inhibitors Cough   Losartan Cough   Penicillins Hives   Sulfa Antibiotics Hives   Sulfonamide Derivatives     Past Medical History:  Diagnosis Date   Diabetes mellitus    Hyperlipidemia    Hypertension    Left rib fracture 02/18/2014    Past Surgical History:  Procedure Laterality Date   APPENDECTOMY     REFRACTIVE SURGERY     Left Eye   surgical sterilization  21 years ago    Family History  Problem Relation Age of Onset   Diabetes Mother    Hypertension Father    Depression Other    Cancer Other        laryngeal   Diabetes Other     Social History:  reports that she has never smoked. She has never used smokeless tobacco. She reports that she does not drink alcohol or use drugs.  Review of Systems:   HYPOTHYROIDISM:  She is has been mild and not clear if she had symptoms except mild hair loss Has been started on treatment by PCP and she feels fairly good now She has lost weight for other reasons  Continues to be on 25 g of levothyroxine  TSH last year was normal  Lab Results  Component Value Date   TSH 2.73 12/08/2018   TSH 2.56 09/23/2018   TSH 2.33 10/29/2017   FREET4 1.17 12/08/2018   FREET4 1.06 06/19/2016   FREET4 1.12 06/10/2016     Hypertension:    Controlled with valsartan, blood pressure is  followed by PCP  BP Readings from Last 3 Encounters:  12/12/18 128/68  11/16/18 138/64  09/23/18 (!) 115/59     Lab Results  Component Value Date   CREATININE 0.65 12/08/2018   BUN 14 12/08/2018   NA 136 12/08/2018   K 4.3 12/08/2018   CL 100 12/08/2018   CO2 27 12/08/2018    Lipids: Has had diabetic dyslipidemia,  well controlled and followed by her PCP   Lab Results  Component Value Date   CHOL 110 12/08/2018   HDL 49.20 12/08/2018   LDLCALC 43 12/08/2018   TRIG 88.0 12/08/2018   CHOLHDL 2 12/08/2018     Diabetic foot exam done in 11/2018  Diabetic foot exam showed normal monofilament sensation in the toes and plantar surfaces, no skin lesions or ulcers on the feet and normal pedal pulses  Last eye exam documented on 02/26/2017     Examination:   BP 128/68 (BP Location: Left Arm, Patient Position: Sitting, Cuff Size: Normal)    Pulse 91    Ht '4\' 10"'$  (1.473 m)    Wt 127 lb 6.4 oz (57.8 kg)    SpO2 97%    BMI 26.63 kg/m   Body mass index is 26.63 kg/m.   Diabetic Foot Exam - Simple   Simple Foot Form Diabetic Foot exam was performed with the following findings: Yes   Visual Inspection No deformities, no ulcerations, no other skin breakdown bilaterally: Yes Sensation Testing Intact to touch and monofilament testing bilaterally: Yes Pulse Check Posterior Tibialis and Dorsalis pulse intact bilaterally:  Yes Comments    No ankle edema present  ASSESSMENT/ PLAN:   Diabetes type 2 insulin requiring  See history of present illness for detailed discussion of  current management, blood sugar patterns and problems identified  Her A1c is 7.6, previously 8  Overall her control is adequate for her age and duration of diabetes However she continues to feel that she has low blood sugar symptoms sometimes during the day but she does not cooperate with monitoring blood sugars when she is symptomatic more after meals on a routine basis Explained to her that A1c  indicates her average blood sugar is about 170 but her morning sugars are only averaging about 110 and frequently below 100  She is needing overall less insulin and some of her low sugars may be related to irregular eating habits Also not always balancing her meals with enough protein Activity level has been variable  She has continued on Invokana without any side effects and change in renal function or potassium   Recommendations:   She was again given detailed written instructions on how to monitor her blood sugars and adjust her insulin, this was also discussed in detail  She will stop her Amaryl in the morning  She does need to reduce her mealtime Humalog if she is eating smaller meals or less carbohydrate  If her blood sugars continue to stay low normal after 1 week of stopping Amaryl she will reduce her Levemir down to 10 units  To take Invokana before starting to eat  Balanced meals with some protein consistently  One Touch Verio monitor was given and she will switch to this after she finishes test strips for the ultra 2  HYPOTHYROIDISM: Needs to continue her levothyroxine 25 mcg, again has normal TSH  LIPIDS: Adequately controlled with normal HDL  Hypertension: Well-controlled  Patient Instructions  Stop GLIMEPERIDE IN AM   Reduce Humalog to 6 for smaller meals  Check blood sugars on waking up 3 days a week  Also check blood sugars about 2 hours after meals and do this after different meals by rotation  Recommended blood sugar levels on waking up are 90-130 and about 2 hours after meal is 130-180  Please bring your blood sugar monitor to each visit, thank you  If am sugar stays <90 reduce Levemir to 10      Counseling time on subjects discussed in assessment and plan sections is over 50% of today's 25 minute visit    Elayne Snare 12/12/2018, 10:53 AM

## 2018-12-12 NOTE — Patient Instructions (Addendum)
Stop GLIMEPERIDE IN AM   Reduce Humalog to 6 for smaller meals  Check blood sugars on waking up 3 days a week  Also check blood sugars about 2 hours after meals and do this after different meals by rotation  Recommended blood sugar levels on waking up are 90-130 and about 2 hours after meal is 130-180  Please bring your blood sugar monitor to each visit, thank you  If am sugar stays <90 reduce Levemir to 10

## 2018-12-16 ENCOUNTER — Telehealth: Payer: Self-pay

## 2018-12-16 NOTE — Telephone Encounter (Signed)
Patient called in needing a script for test strips wanting a 90 day supply.   pharmacy is [facility]   Meter she is using One Touch Radio Flex

## 2018-12-19 ENCOUNTER — Other Ambulatory Visit: Payer: Self-pay

## 2018-12-19 MED ORDER — GLUCOSE BLOOD VI STRP: ORAL_STRIP | 3 refills | Status: DC

## 2018-12-19 MED ORDER — ONETOUCH DELICA LANCETS 30G MISC: 1.0000 | Freq: Two times a day (BID) | 3 refills | Status: DC

## 2018-12-19 NOTE — Telephone Encounter (Signed)
Rx sent 

## 2018-12-21 ENCOUNTER — Other Ambulatory Visit: Payer: Self-pay | Admitting: Family

## 2018-12-21 NOTE — Telephone Encounter (Signed)
Pt has called back and wants to talk to the nurse again, [phone]. Want two inhalers?

## 2018-12-21 NOTE — Telephone Encounter (Signed)
Patient requesting  albuterol (VENTOLIN HFA) 108 (90 Base) MCG/ACT inhaler but not on current medication list.

## 2018-12-21 NOTE — Telephone Encounter (Signed)
Medication Refill - Medication: Pt called to request refill on inhaler. It was not clear which The only inhaler listed on current med list is Flovent HFA but pt mentioned 108 base inhaler. Very hard to understand.  Has the patient contacted their pharmacy? Yes.   (Agent: If no, request that the patient contact the pharmacy for the refill.) (Agent: If yes, when and what did the pharmacy advise?)  Preferred Pharmacy (with phone number or street name):  [place], [place] [phone] (Phone) [phone] (Fax)     Agent: Please be advised that RX refills may take up to 3 business days. We ask that you follow-up with your pharmacy.

## 2018-12-22 ENCOUNTER — Other Ambulatory Visit: Payer: Self-pay

## 2018-12-22 NOTE — Telephone Encounter (Signed)
Patient will like to get a new rx for albuterol inhaler. Last fill 2018. Will like to switch back from flovent.

## 2018-12-23 MED ORDER — ALBUTEROL SULFATE HFA 108 (90 BASE) MCG/ACT IN AERS: 2.0000 | INHALATION_SPRAY | Freq: Four times a day (QID) | RESPIRATORY_TRACT | 2 refills | Status: DC | PRN

## 2018-12-23 NOTE — Telephone Encounter (Signed)
rx sent

## 2019-01-03 ENCOUNTER — Encounter: Payer: Medicare Other | Admitting: Family

## 2019-01-10 ENCOUNTER — Other Ambulatory Visit: Payer: Self-pay

## 2019-01-10 ENCOUNTER — Encounter: Payer: Self-pay | Admitting: Family

## 2019-01-10 ENCOUNTER — Ambulatory Visit (INDEPENDENT_AMBULATORY_CARE_PROVIDER_SITE_OTHER): Payer: Medicare Other | Admitting: Family

## 2019-01-10 VITALS — BP 102/87 | HR 80 | Temp 97.1°F | Resp 16 | Wt 127.8 lb

## 2019-01-10 DIAGNOSIS — Z0000 Encounter for general adult medical examination without abnormal findings: Secondary | ICD-10-CM

## 2019-01-10 DIAGNOSIS — Z20822 Contact with and (suspected) exposure to covid-19: Secondary | ICD-10-CM

## 2019-01-10 DIAGNOSIS — Z20828 Contact with and (suspected) exposure to other viral communicable diseases: Secondary | ICD-10-CM

## 2019-01-10 MED ORDER — FLUCONAZOLE 150 MG PO TABS: ORAL_TABLET | ORAL | 0 refills | Status: DC

## 2019-01-10 MED ORDER — ALBUTEROL SULFATE HFA 108 (90 BASE) MCG/ACT IN AERS: 2.0000 | INHALATION_SPRAY | Freq: Four times a day (QID) | RESPIRATORY_TRACT | 2 refills | Status: DC | PRN

## 2019-01-10 NOTE — Progress Notes (Signed)
Subjective:    Patient ID: Mary Flynn, female    DOB: [birth year], 84 y.o.   MRN: [id number]  HPI  Patient presents today for complete physical.  Immunizations:  Up to date Diet: healthy Exercise:  Walks regularly Colonoscopy:  N/A Dexa: 2018 Pap Smear: N/A Mammogram: 10/06/18 Wt Readings from Last 3 Encounters:  01/10/19 127 lb 12.8 oz (58 kg)  12/12/18 127 lb 6.4 oz (57.8 kg)  11/16/18 127 lb (57.6 kg)    She will be leaving on 01/15/19 for [place] to stay with her daughter for 4 months.  She needs documentation of a negative covid-19 test to bring with her on her trip. She is asymptomatic. It is a hardship for she and her elderly husband to go to our [employer] station due to their age and health conditions.  She is requesting covid swab to be done in our office today.     Review of Systems  Constitutional: Negative for unexpected weight change.  HENT: Negative for rhinorrhea.   Respiratory: Positive for cough (occasional cough when she talks on the phone).   Cardiovascular: Negative for leg swelling.  Gastrointestinal: Negative for constipation.  Genitourinary: Negative for dysuria and frequency.  Musculoskeletal:       Reports improvement in her joint pain  Neurological: Negative for headaches.  Hematological: Negative for adenopathy.  Psychiatric/Behavioral:       Denies depression       Past Medical History:  Diagnosis Date  . Diabetes mellitus   . Hyperlipidemia   . Hypertension   . Left rib fracture 02/18/2014     Social History   Socioeconomic History  . Marital status: Married    Spouse name: Sojourner Behringer  . Number of children: 2  . Years of education: College  . Highest education level: Not on file  Occupational History  . Occupation: housewife  Social Needs  . Financial resource strain: Not on file  . Food insecurity    Worry: Not on file    Inability: Not on file  . Transportation needs    Medical: Not on file    Non-medical: Not on  file  Tobacco Use  . Smoking status: Never Smoker  . Smokeless tobacco: Never Used  Substance and Sexual Activity  . Alcohol use: No  . Drug use: No  . Sexual activity: Not Currently  Lifestyle  . Physical activity    Days per week: Not on file    Minutes per session: Not on file  . Stress: Not on file  Relationships  . Social Herbalist on phone: Not on file    Gets together: Not on file    Attends religious service: Not on file    Active member of club or organization: Not on file    Attends meetings of clubs or organizations: Not on file    Relationship status: Not on file  . Intimate partner violence    Fear of current or ex partner: Not on file    Emotionally abused: Not on file    Physically abused: Not on file    Forced sexual activity: Not on file  Other Topics Concern  . Not on file  Social History Narrative   Patient lives at home with spouse.   Caffeine Use: rarely    Past Surgical History:  Procedure Laterality Date  . APPENDECTOMY    . REFRACTIVE SURGERY     Left Eye  . surgical sterilization  40 years  ago    Family History  Problem Relation Age of Onset  . Diabetes Mother   . Hypertension Father   . Depression Other   . Cancer Other        laryngeal  . Diabetes Other     Allergies  Allergen Reactions  . Ace Inhibitors Cough  . Losartan Cough  . Penicillins Hives  . Sulfa Antibiotics Hives  . Sulfonamide Derivatives     Current Outpatient Medications on File Prior to Visit  Medication Sig Dispense Refill  . amLODipine (NORVASC) 5 MG tablet Take 1 tablet (5 mg total) by mouth daily. 90 tablet 1  . aspirin EC 81 MG tablet Take 81 mg by mouth daily.    Marland Kitchen atorvastatin (LIPITOR) 10 MG tablet TAKE 1 TABLET BY MOUTH  EVERY DAY 90 tablet 1  . b complex vitamins tablet Take 1 tablet by mouth daily.    . Blood Glucose Monitoring Suppl (ONETOUCH VERIO FLEX SYSTEM) w/Device KIT 1 each by Does not apply route 2 (two) times daily. Use  Onetouch verio flex to check blood sugar twice daily.    . canagliflozin (INVOKANA) 100 MG TABS tablet Take 100 mg by mouth daily before breakfast.    . cyclobenzaprine (FLEXERIL) 10 MG tablet TAKE 1 TABLET BY MOUTH TWO  TIMES DAILY AS NEEDED FOR  MUSCLE SPASM 180 tablet 0  . DULoxetine (CYMBALTA) 30 MG capsule TAKE 1 CAPSULE BY MOUTH  DAILY 90 capsule 1  . famotidine (PEPCID) 20 MG tablet Take 1 tablet (20 mg total) by mouth at bedtime. 90 tablet 3  . fluticasone (FLONASE) 50 MCG/ACT nasal spray Place 2 sprays into both nostrils daily. 16 g 6  . fluticasone (FLOVENT HFA) 110 MCG/ACT inhaler Inhale 2 puffs into the lungs 2 (two) times daily. 1 Inhaler 5  . gabapentin (NEURONTIN) 300 MG capsule TAKE 1 CAPSULE BY MOUTH 4  TIMES DAILY 360 capsule 0  . glimepiride (AMARYL) 4 MG tablet TAKE 1 TABLET BY MOUTH  TWICE A DAY 180 tablet 1  . glucose blood test strip Use OneTouch Verio Flex test strips as instructed to check blood sugar twice daily. 200 each 3  . Insulin Detemir (LEVEMIR FLEXPEN) 100 UNIT/ML Pen Inject 12 Units into the skin at bedtime.  15 mL 3  . insulin lispro (HUMALOG KWIKPEN) 100 UNIT/ML KiwkPen 8 units with breakfast and lunch, 4 units at evening meal    . Insulin Pen Needle 32G X 4 MM MISC Use 3 per day 100 each 3  . latanoprost (XALATAN) 0.005 % ophthalmic solution Place 1 drop into both eyes at bedtime.    Marland Kitchen levocetirizine (XYZAL) 5 MG tablet TAKE 1 TABLET BY MOUTH  EVERY EVENING 90 tablet 1  . levothyroxine (SYNTHROID) 25 MCG tablet TAKE 1 TABLET BY MOUTH  DAILY BEFORE BREAKFAST 90 tablet 3  . metFORMIN (GLUCOPHAGE) 1000 MG tablet TAKE 1 TABLET BY MOUTH TWO  TIMES DAILY WITH A MEAL 180 tablet 1  . metoprolol succinate (TOPROL-XL) 100 MG 24 hr tablet TAKE 1 TABLET BY MOUTH  DAILY WITH OR IMMEDIATELY  FOLLOWING A MEAL 90 tablet 1  . montelukast (SINGULAIR) 10 MG tablet TAKE 1 TABLET BY MOUTH AT  BEDTIME 90 tablet 1  . Multiple Vitamins-Minerals (HAIR SKIN AND NAILS FORMULA PO) Take  by mouth daily.    . niacin (NIASPAN) 500 MG CR tablet Take 2 tablets (1,000 mg total) by mouth at bedtime. 90 tablet 1  . NONFORMULARY OR COMPOUNDED ITEM Lumbar support  belt  #1  As directed 1 each 0  . nystatin ointment (MYCOSTATIN) APPLY TOPICALLY TWO TIMES  DAILY AS NEEDED 90 g 0  . OneTouch Delica Lancets 29V MISC 1 each by Does not apply route 2 (two) times daily. Use OneTouch Delica lancets to check blood sugar twice daily. 200 each 3  . pantoprazole (PROTONIX) 40 MG tablet TAKE 1 TABLET BY MOUTH  DAILY 90 tablet 1  . timolol (BETIMOL) 0.5 % ophthalmic solution Place 1 drop into both eyes 2 (two) times daily.    Marland Kitchen triamcinolone (KENALOG) 0.1 % paste Use as directed 1 application in the mouth or throat 2 (two) times daily. 15 g 1  . vitamin E 400 UNIT capsule Take 400 Units by mouth daily.     No current facility-administered medications on file prior to visit.     BP 102/87 (BP Location: Left Arm, Patient Position: Sitting, Cuff Size: Small)   Pulse 80   Temp (!) 97.1 F (36.2 C) (Temporal)   Resp 16   Wt 127 lb 12.8 oz (58 kg)   SpO2 99%   BMI 26.71 kg/m    Objective:   Physical Exam Constitutional:      Appearance: She is well-developed.  Neck:     Musculoskeletal: Neck supple.     Thyroid: No thyromegaly.  Cardiovascular:     Rate and Rhythm: Normal rate and regular rhythm.     Heart sounds: Normal heart sounds. No murmur.  Pulmonary:     Effort: Pulmonary effort is normal. No respiratory distress.     Breath sounds: Normal breath sounds. No wheezing.  Skin:    General: Skin is warm and dry.  Neurological:     Mental Status: She is alert and oriented to person, place, and time.  Psychiatric:        Behavior: Behavior normal.        Thought Content: Thought content normal.        Judgment: Judgment normal.           Assessment & Plan:  Preventative care- continue healthy diet, discussed walking. Flu shot up to date. Immunizations reviewed and up to date.  Mammogram and dexa are up to date.   Covid-19 swab is obtained today at pt request.

## 2019-01-12 ENCOUNTER — Telehealth: Payer: Self-pay | Admitting: Family

## 2019-01-12 LAB — NOVEL CORONAVIRUS, NAA: SARS-CoV-2, NAA: NOT DETECTED

## 2019-01-12 NOTE — Telephone Encounter (Signed)
Pt called wanting to have Covid test results sent to her email:  [email or web address] by Friday or ASAP cause pt is not sure if she is going to travel to [place] Saturday 01-14-2019 with her spouse. Please advise.

## 2019-01-12 NOTE — Telephone Encounter (Signed)
Results given to patient.she will try to set up her mychart account to get results

## 2019-01-12 NOTE — Telephone Encounter (Signed)
Please advise pt that her result is negative.  She can either access it via mychart or we can leave a copy at the front desk. Unfortunately we are unable to email the results.  Her husband's test is still pending.

## 2019-01-13 ENCOUNTER — Telehealth: Payer: Self-pay

## 2019-01-13 NOTE — Telephone Encounter (Signed)
Copied from [facility] [id number]. Topic: General - Other >> Jan 13, 2019 10:21 AM Yvette Rack wrote: Reason for CRM: Pt called and requested to speak with Kennyth Lose. Pt requests that Kennyth Lose return her call regarding getting a copy of her Covid-19 test results. Cb# [phone]

## 2019-01-19 ENCOUNTER — Other Ambulatory Visit: Payer: Self-pay | Admitting: Family

## 2019-01-19 ENCOUNTER — Other Ambulatory Visit: Payer: Self-pay | Admitting: Internal Medicine

## 2019-01-19 DIAGNOSIS — E1140 Type 2 diabetes mellitus with diabetic neuropathy, unspecified: Secondary | ICD-10-CM

## 2019-01-19 DIAGNOSIS — I10 Essential (primary) hypertension: Secondary | ICD-10-CM

## 2019-01-19 DIAGNOSIS — K219 Gastro-esophageal reflux disease without esophagitis: Secondary | ICD-10-CM

## 2019-01-19 DIAGNOSIS — E785 Hyperlipidemia, unspecified: Secondary | ICD-10-CM

## 2019-01-23 ENCOUNTER — Ambulatory Visit: Payer: Medicare Other | Admitting: Family Medicine

## 2019-01-24 ENCOUNTER — Ambulatory Visit (HOSPITAL_BASED_OUTPATIENT_CLINIC_OR_DEPARTMENT_OTHER)
Admission: RE | Admit: 2019-01-24 | Discharge: 2019-01-24 | Disposition: A | Discharge status: Home / Self Care | Payer: Medicare Other | Source: Ambulatory Visit | Attending: Family | Admitting: Family

## 2019-01-24 ENCOUNTER — Telehealth: Payer: Self-pay | Admitting: Family

## 2019-01-24 ENCOUNTER — Encounter: Payer: Self-pay | Admitting: Family

## 2019-01-24 ENCOUNTER — Other Ambulatory Visit: Payer: Self-pay

## 2019-01-24 ENCOUNTER — Ambulatory Visit (INDEPENDENT_AMBULATORY_CARE_PROVIDER_SITE_OTHER): Payer: Medicare Other | Admitting: Family

## 2019-01-24 VITALS — BP 125/60 | HR 98 | Temp 96.6°F | Resp 16 | Wt 129.0 lb

## 2019-01-24 DIAGNOSIS — M7918 Myalgia, other site: Secondary | ICD-10-CM

## 2019-01-24 NOTE — Progress Notes (Signed)
Subjective:    Patient ID: Mary Flynn, female    DOB: 1934/06/08, 84 y.o.   MRN: [id number]  HPI  Patient is an 84 yr old female who presents today follow up after fall. Reports that on Thursday she was standing and then lost her balance.  Denies LOC.  She caught herself with her left arm and grabbed the sofa. She reports + pain in the left shoulder and left upper ribs.  Hurts to cough. Denies SOB.    Review of Systems   See HPI  Past Medical History:  Diagnosis Date  . Diabetes mellitus   . Hyperlipidemia   . Hypertension   . Left rib fracture 02/18/2014     Social History   Socioeconomic History  . Marital status: Married    Spouse name: Lecretia Buczek  . Number of children: 2  . Years of education: College  . Highest education level: Not on file  Occupational History  . Occupation: housewife  Social Needs  . Financial resource strain: Not on file  . Food insecurity    Worry: Not on file    Inability: Not on file  . Transportation needs    Medical: Not on file    Non-medical: Not on file  Tobacco Use  . Smoking status: Never Smoker  . Smokeless tobacco: Never Used  Substance and Sexual Activity  . Alcohol use: No  . Drug use: No  . Sexual activity: Not Currently  Lifestyle  . Physical activity    Days per week: Not on file    Minutes per session: Not on file  . Stress: Not on file  Relationships  . Social Herbalist on phone: Not on file    Gets together: Not on file    Attends religious service: Not on file    Active member of club or organization: Not on file    Attends meetings of clubs or organizations: Not on file    Relationship status: Not on file  . Intimate partner violence    Fear of current or ex partner: Not on file    Emotionally abused: Not on file    Physically abused: Not on file    Forced sexual activity: Not on file  Other Topics Concern  . Not on file  Social History Narrative   Patient lives at home with spouse.   Caffeine Use: rarely    Past Surgical History:  Procedure Laterality Date  . APPENDECTOMY    . REFRACTIVE SURGERY     Left Eye  . surgical sterilization  40 years ago    Family History  Problem Relation Age of Onset  . Diabetes Mother   . Hypertension Father   . Depression Other   . Cancer Other        laryngeal  . Diabetes Other     Allergies  Allergen Reactions  . Ace Inhibitors Cough  . Losartan Cough  . Penicillins Hives  . Sulfa Antibiotics Hives  . Sulfonamide Derivatives     Current Outpatient Medications on File Prior to Visit  Medication Sig Dispense Refill  . albuterol (PROAIR HFA) 108 (90 Base) MCG/ACT inhaler Inhale 2 puffs into the lungs every 6 (six) hours as needed for wheezing or shortness of breath. 18 g 2  . amLODipine (NORVASC) 5 MG tablet Take 1 tablet (5 mg total) by mouth daily. 90 tablet 1  . aspirin EC 81 MG tablet Take 81 mg by mouth daily.    Marland Kitchen  atorvastatin (LIPITOR) 10 MG tablet TAKE 1 TABLET BY MOUTH  DAILY 90 tablet 1  . b complex vitamins tablet Take 1 tablet by mouth daily.    . Blood Glucose Monitoring Suppl (ONETOUCH VERIO FLEX SYSTEM) w/Device KIT 1 each by Does not apply route 2 (two) times daily. Use Onetouch verio flex to check blood sugar twice daily.    . canagliflozin (INVOKANA) 100 MG TABS tablet Take 100 mg by mouth daily before breakfast.    . cyclobenzaprine (FLEXERIL) 10 MG tablet TAKE 1 TABLET BY MOUTH TWO  TIMES DAILY AS NEEDED FOR  MUSCLE SPASM 180 tablet 0  . DULoxetine (CYMBALTA) 30 MG capsule TAKE 1 CAPSULE BY MOUTH  DAILY 90 capsule 1  . famotidine (PEPCID) 20 MG tablet TAKE 1 TABLET BY MOUTH AT  BEDTIME 90 tablet 3  . fluconazole (DIFLUCAN) 150 MG tablet Take one tablet by mouth as needed for yeast infection 3 tablet 0  . fluticasone (FLONASE) 50 MCG/ACT nasal spray Place 2 sprays into both nostrils daily. 16 g 6  . fluticasone (FLOVENT HFA) 110 MCG/ACT inhaler Inhale 2 puffs into the lungs 2 (two) times daily. 1 Inhaler  5  . gabapentin (NEURONTIN) 300 MG capsule TAKE 1 CAPSULE BY MOUTH 4  TIMES DAILY 360 capsule 0  . glimepiride (AMARYL) 4 MG tablet TAKE 1 TABLET BY MOUTH  TWICE DAILY 180 tablet 1  . glucose blood test strip Use OneTouch Verio Flex test strips as instructed to check blood sugar twice daily. 200 each 3  . Insulin Detemir (LEVEMIR FLEXPEN) 100 UNIT/ML Pen Inject 12 Units into the skin at bedtime.  15 mL 3  . insulin lispro (HUMALOG KWIKPEN) 100 UNIT/ML KiwkPen 8 units with breakfast and lunch, 4 units at evening meal    . Insulin Pen Needle 32G X 4 MM MISC Use 3 per day 100 each 3  . latanoprost (XALATAN) 0.005 % ophthalmic solution Place 1 drop into both eyes at bedtime.    Marland Kitchen levocetirizine (XYZAL) 5 MG tablet TAKE 1 TABLET BY MOUTH  EVERY EVENING 90 tablet 1  . levothyroxine (SYNTHROID) 25 MCG tablet TAKE 1 TABLET BY MOUTH  DAILY BEFORE BREAKFAST 90 tablet 3  . metFORMIN (GLUCOPHAGE) 1000 MG tablet TAKE 1 TABLET BY MOUTH  TWICE DAILY WITH A MEAL 180 tablet 1  . metoprolol succinate (TOPROL-XL) 100 MG 24 hr tablet TAKE 1 TABLET BY MOUTH  DAILY WITH OR IMMEDIATELY  FOLLOWING A MEAL 90 tablet 1  . montelukast (SINGULAIR) 10 MG tablet TAKE 1 TABLET BY MOUTH AT  BEDTIME 90 tablet 1  . Multiple Vitamins-Minerals (HAIR SKIN AND NAILS FORMULA PO) Take by mouth daily.    . niacin (NIASPAN) 500 MG CR tablet Take 2 tablets (1,000 mg total) by mouth at bedtime. 90 tablet 1  . NONFORMULARY OR COMPOUNDED ITEM Lumbar support belt  #1  As directed 1 each 0  . nystatin ointment (MYCOSTATIN) APPLY TOPICALLY TWO TIMES  DAILY AS NEEDED 90 g 0  . OneTouch Delica Lancets 54M MISC 1 each by Does not apply route 2 (two) times daily. Use OneTouch Delica lancets to check blood sugar twice daily. 200 each 3  . pantoprazole (PROTONIX) 40 MG tablet TAKE 1 TABLET BY MOUTH  DAILY 90 tablet 1  . timolol (BETIMOL) 0.5 % ophthalmic solution Place 1 drop into both eyes 2 (two) times daily.    Marland Kitchen triamcinolone (KENALOG) 0.1 % paste  Use as directed 1 application in the mouth or throat 2 (  two) times daily. 15 g 1  . vitamin E 400 UNIT capsule Take 400 Units by mouth daily.    . [DISCONTINUED] atorvastatin (LIPITOR) 10 MG tablet TAKE 1 TABLET BY MOUTH  EVERY DAY 90 tablet 1  . [DISCONTINUED] DULoxetine (CYMBALTA) 30 MG capsule TAKE 1 CAPSULE BY MOUTH  DAILY 90 capsule 1  . [DISCONTINUED] glimepiride (AMARYL) 4 MG tablet TAKE 1 TABLET BY MOUTH  TWICE A DAY 180 tablet 1  . [DISCONTINUED] metFORMIN (GLUCOPHAGE) 1000 MG tablet TAKE 1 TABLET BY MOUTH TWO  TIMES DAILY WITH A MEAL 180 tablet 1  . [DISCONTINUED] metoprolol succinate (TOPROL-XL) 100 MG 24 hr tablet TAKE 1 TABLET BY MOUTH  DAILY WITH OR IMMEDIATELY  FOLLOWING A MEAL 90 tablet 1  . [DISCONTINUED] montelukast (SINGULAIR) 10 MG tablet TAKE 1 TABLET BY MOUTH AT  BEDTIME 90 tablet 1  . [DISCONTINUED] pantoprazole (PROTONIX) 40 MG tablet TAKE 1 TABLET BY MOUTH  DAILY 90 tablet 1   No current facility-administered medications on file prior to visit.     BP 125/60 (BP Location: Right Arm, Patient Position: Sitting, Cuff Size: Small)   Pulse 98   Temp (!) 96.6 F (35.9 C) (Temporal)   Resp 16   Wt 129 lb (58.5 kg)   SpO2 99%   BMI 26.96 kg/m        Past Medical History:  Diagnosis Date  . Diabetes mellitus   . Hyperlipidemia   . Hypertension   . Left rib fracture 02/18/2014     Social History   Socioeconomic History  . Marital status: Married    Spouse name: Nelia Rogoff  . Number of children: 2  . Years of education: College  . Highest education level: Not on file  Occupational History  . Occupation: housewife  Social Needs  . Financial resource strain: Not on file  . Food insecurity    Worry: Not on file    Inability: Not on file  . Transportation needs    Medical: Not on file    Non-medical: Not on file  Tobacco Use  . Smoking status: Never Smoker  . Smokeless tobacco: Never Used  Substance and Sexual Activity  . Alcohol use: No  . Drug  use: No  . Sexual activity: Not Currently  Lifestyle  . Physical activity    Days per week: Not on file    Minutes per session: Not on file  . Stress: Not on file  Relationships  . Social Herbalist on phone: Not on file    Gets together: Not on file    Attends religious service: Not on file    Active member of club or organization: Not on file    Attends meetings of clubs or organizations: Not on file    Relationship status: Not on file  . Intimate partner violence    Fear of current or ex partner: Not on file    Emotionally abused: Not on file    Physically abused: Not on file    Forced sexual activity: Not on file  Other Topics Concern  . Not on file  Social History Narrative   Patient lives at home with spouse.   Caffeine Use: rarely    Past Surgical History:  Procedure Laterality Date  . APPENDECTOMY    . REFRACTIVE SURGERY     Left Eye  . surgical sterilization  40 years ago    Family History  Problem Relation Age of Onset  . Diabetes  Mother   . Hypertension Father   . Depression Other   . Cancer Other        laryngeal  . Diabetes Other     Allergies  Allergen Reactions  . Ace Inhibitors Cough  . Losartan Cough  . Penicillins Hives  . Sulfa Antibiotics Hives  . Sulfonamide Derivatives     Current Outpatient Medications on File Prior to Visit  Medication Sig Dispense Refill  . albuterol (PROAIR HFA) 108 (90 Base) MCG/ACT inhaler Inhale 2 puffs into the lungs every 6 (six) hours as needed for wheezing or shortness of breath. 18 g 2  . amLODipine (NORVASC) 5 MG tablet Take 1 tablet (5 mg total) by mouth daily. 90 tablet 1  . aspirin EC 81 MG tablet Take 81 mg by mouth daily.    Marland Kitchen atorvastatin (LIPITOR) 10 MG tablet TAKE 1 TABLET BY MOUTH  DAILY 90 tablet 1  . b complex vitamins tablet Take 1 tablet by mouth daily.    . Blood Glucose Monitoring Suppl (ONETOUCH VERIO FLEX SYSTEM) w/Device KIT 1 each by Does not apply route 2 (two) times daily.  Use Onetouch verio flex to check blood sugar twice daily.    . canagliflozin (INVOKANA) 100 MG TABS tablet Take 100 mg by mouth daily before breakfast.    . cyclobenzaprine (FLEXERIL) 10 MG tablet TAKE 1 TABLET BY MOUTH TWO  TIMES DAILY AS NEEDED FOR  MUSCLE SPASM 180 tablet 0  . DULoxetine (CYMBALTA) 30 MG capsule TAKE 1 CAPSULE BY MOUTH  DAILY 90 capsule 1  . famotidine (PEPCID) 20 MG tablet TAKE 1 TABLET BY MOUTH AT  BEDTIME 90 tablet 3  . fluconazole (DIFLUCAN) 150 MG tablet Take one tablet by mouth as needed for yeast infection 3 tablet 0  . fluticasone (FLONASE) 50 MCG/ACT nasal spray Place 2 sprays into both nostrils daily. 16 g 6  . fluticasone (FLOVENT HFA) 110 MCG/ACT inhaler Inhale 2 puffs into the lungs 2 (two) times daily. 1 Inhaler 5  . gabapentin (NEURONTIN) 300 MG capsule TAKE 1 CAPSULE BY MOUTH 4  TIMES DAILY 360 capsule 0  . glimepiride (AMARYL) 4 MG tablet TAKE 1 TABLET BY MOUTH  TWICE DAILY 180 tablet 1  . glucose blood test strip Use OneTouch Verio Flex test strips as instructed to check blood sugar twice daily. 200 each 3  . Insulin Detemir (LEVEMIR FLEXPEN) 100 UNIT/ML Pen Inject 12 Units into the skin at bedtime.  15 mL 3  . insulin lispro (HUMALOG KWIKPEN) 100 UNIT/ML KiwkPen 8 units with breakfast and lunch, 4 units at evening meal    . Insulin Pen Needle 32G X 4 MM MISC Use 3 per day 100 each 3  . latanoprost (XALATAN) 0.005 % ophthalmic solution Place 1 drop into both eyes at bedtime.    Marland Kitchen levocetirizine (XYZAL) 5 MG tablet TAKE 1 TABLET BY MOUTH  EVERY EVENING 90 tablet 1  . levothyroxine (SYNTHROID) 25 MCG tablet TAKE 1 TABLET BY MOUTH  DAILY BEFORE BREAKFAST 90 tablet 3  . metFORMIN (GLUCOPHAGE) 1000 MG tablet TAKE 1 TABLET BY MOUTH  TWICE DAILY WITH A MEAL 180 tablet 1  . metoprolol succinate (TOPROL-XL) 100 MG 24 hr tablet TAKE 1 TABLET BY MOUTH  DAILY WITH OR IMMEDIATELY  FOLLOWING A MEAL 90 tablet 1  . montelukast (SINGULAIR) 10 MG tablet TAKE 1 TABLET BY MOUTH  AT  BEDTIME 90 tablet 1  . Multiple Vitamins-Minerals (HAIR SKIN AND NAILS FORMULA PO) Take by mouth daily.    Marland Kitchen  niacin (NIASPAN) 500 MG CR tablet Take 2 tablets (1,000 mg total) by mouth at bedtime. 90 tablet 1  . NONFORMULARY OR COMPOUNDED ITEM Lumbar support belt  #1  As directed 1 each 0  . nystatin ointment (MYCOSTATIN) APPLY TOPICALLY TWO TIMES  DAILY AS NEEDED 90 g 0  . OneTouch Delica Lancets 03T MISC 1 each by Does not apply route 2 (two) times daily. Use OneTouch Delica lancets to check blood sugar twice daily. 200 each 3  . pantoprazole (PROTONIX) 40 MG tablet TAKE 1 TABLET BY MOUTH  DAILY 90 tablet 1  . timolol (BETIMOL) 0.5 % ophthalmic solution Place 1 drop into both eyes 2 (two) times daily.    Marland Kitchen triamcinolone (KENALOG) 0.1 % paste Use as directed 1 application in the mouth or throat 2 (two) times daily. 15 g 1  . vitamin E 400 UNIT capsule Take 400 Units by mouth daily.    . [DISCONTINUED] atorvastatin (LIPITOR) 10 MG tablet TAKE 1 TABLET BY MOUTH  EVERY DAY 90 tablet 1  . [DISCONTINUED] DULoxetine (CYMBALTA) 30 MG capsule TAKE 1 CAPSULE BY MOUTH  DAILY 90 capsule 1  . [DISCONTINUED] glimepiride (AMARYL) 4 MG tablet TAKE 1 TABLET BY MOUTH  TWICE A DAY 180 tablet 1  . [DISCONTINUED] metFORMIN (GLUCOPHAGE) 1000 MG tablet TAKE 1 TABLET BY MOUTH TWO  TIMES DAILY WITH A MEAL 180 tablet 1  . [DISCONTINUED] metoprolol succinate (TOPROL-XL) 100 MG 24 hr tablet TAKE 1 TABLET BY MOUTH  DAILY WITH OR IMMEDIATELY  FOLLOWING A MEAL 90 tablet 1  . [DISCONTINUED] montelukast (SINGULAIR) 10 MG tablet TAKE 1 TABLET BY MOUTH AT  BEDTIME 90 tablet 1  . [DISCONTINUED] pantoprazole (PROTONIX) 40 MG tablet TAKE 1 TABLET BY MOUTH  DAILY 90 tablet 1   No current facility-administered medications on file prior to visit.     There were no vitals taken for this visit.   Objective:   Physical Exam Constitutional:      Appearance: She is well-developed.  Neck:     Musculoskeletal: Neck supple.      Thyroid: No thyromegaly.  Cardiovascular:     Rate and Rhythm: Normal rate and regular rhythm.     Heart sounds: Normal heart sounds. No murmur.  Pulmonary:     Effort: Pulmonary effort is normal. No respiratory distress.     Breath sounds: Normal breath sounds. No wheezing.  Musculoskeletal:     Comments: Left upper anterior rib tenderness, left shoulder pain  Skin:    General: Skin is warm and dry.  Neurological:     Mental Status: She is alert and oriented to person, place, and time.     Comments: Bilateral upper extremity/lower extremity strength is 5/5  Psychiatric:        Behavior: Behavior normal.        Thought Content: Thought content normal.        Judgment: Judgment normal.           Assessment & Plan:  Musculoskeletal pain- will obtain x-ray of ribs/left shoulder to rule out fracture.  Advised pt to use cane to help prevent falls.

## 2019-01-24 NOTE — Telephone Encounter (Signed)
Patient advised of results.

## 2019-01-24 NOTE — Telephone Encounter (Signed)
Patient advised of results, she will continue to take tylenol.

## 2019-01-24 NOTE — Telephone Encounter (Signed)
Please contact pt and let her know that x-rays are negative for fracture.  Just bruising.  Recommend tylenol as needed for pain.  Should improve on its own.

## 2019-01-24 NOTE — Patient Instructions (Signed)
Please complete x-rays on the first floor. Use your can to avoid falling. We will contact you with the results of your x-rays.

## 2019-01-25 ENCOUNTER — Telehealth: Payer: Self-pay | Admitting: Family

## 2019-01-25 NOTE — Telephone Encounter (Signed)
Pt called and stated that she heard on the news that the covid vaccination will be available soon, pt states if our office gets the vaccination to please let pt know and pt can schedule an appt.

## 2019-01-25 NOTE — Telephone Encounter (Signed)
Patient will be notified if we get the vaccine for administration.

## 2019-02-09 LAB — HM DIABETES EYE EXAM

## 2019-02-14 ENCOUNTER — Other Ambulatory Visit: Payer: Self-pay

## 2019-02-14 MED ORDER — GLUCOSE BLOOD VI STRP: ORAL_STRIP | 3 refills | Status: DC

## 2019-03-01 ENCOUNTER — Telehealth: Payer: Self-pay

## 2019-03-01 NOTE — Telephone Encounter (Signed)
-----   Message from Elayne Snare, MD sent at 02/19/2019  6:44 PM EST ----- Regarding: appt Patient due in 1/21 for appointment.  Please call to schedule with labs before appointment

## 2019-03-01 NOTE — Telephone Encounter (Signed)
ATC NO VM SET UP WILL CALL AGAIN

## 2019-03-02 ENCOUNTER — Other Ambulatory Visit: Payer: Self-pay | Admitting: Internal Medicine

## 2019-03-09 NOTE — Telephone Encounter (Signed)
Scheduled labs and f/u appointment- first available was 03/27/19

## 2019-03-09 NOTE — Telephone Encounter (Signed)
Thank you :)

## 2019-03-10 ENCOUNTER — Encounter: Payer: Self-pay | Admitting: Family

## 2019-03-10 ENCOUNTER — Other Ambulatory Visit: Payer: Self-pay

## 2019-03-10 ENCOUNTER — Ambulatory Visit (HOSPITAL_BASED_OUTPATIENT_CLINIC_OR_DEPARTMENT_OTHER)
Admission: RE | Admit: 2019-03-10 | Discharge: 2019-03-10 | Disposition: A | Discharge status: Home / Self Care | Payer: Medicare Other | Source: Ambulatory Visit | Attending: Family | Admitting: Family

## 2019-03-10 ENCOUNTER — Ambulatory Visit (INDEPENDENT_AMBULATORY_CARE_PROVIDER_SITE_OTHER): Payer: Medicare Other | Admitting: Family

## 2019-03-10 VITALS — BP 113/66 | HR 87 | Temp 96.5°F | Resp 16 | Wt 133.0 lb

## 2019-03-10 DIAGNOSIS — M7989 Other specified soft tissue disorders: Secondary | ICD-10-CM

## 2019-03-10 DIAGNOSIS — R1032 Left lower quadrant pain: Secondary | ICD-10-CM | POA: Insufficient documentation

## 2019-03-10 DIAGNOSIS — R053 Chronic cough: Secondary | ICD-10-CM

## 2019-03-10 DIAGNOSIS — R05 Cough: Secondary | ICD-10-CM

## 2019-03-10 NOTE — Progress Notes (Signed)
Subjective:    Patient ID: Mary Flynn, female    DOB: 1934-02-28, 85 y.o.   MRN: [id number]  HPI  Patient is an 85 yr old female who presents today with chief complaint of left groin pain.  Reports that she has to lift her left leg off the chair with her hands due to the pain.  Also has some left calf tendernss and swelling.   Continues to have issues with chronic cough. Requesting rx for Tussionex.     Review of Systems See HPI  Past Medical History:  Diagnosis Date  . Diabetes mellitus   . Hyperlipidemia   . Hypertension   . Left rib fracture 02/18/2014     Social History   Socioeconomic History  . Marital status: Married    Spouse name: Janaya Broy  . Number of children: 2  . Years of education: College  . Highest education level: Not on file  Occupational History  . Occupation: housewife  Tobacco Use  . Smoking status: Never Smoker  . Smokeless tobacco: Never Used  Substance and Sexual Activity  . Alcohol use: No  . Drug use: No  . Sexual activity: Not Currently  Other Topics Concern  . Not on file  Social History Narrative   Patient lives at home with spouse.   Caffeine Use: rarely   Social Determinants of Health   Financial Resource Strain:   . Difficulty of Paying Living Expenses: Not on file  Food Insecurity:   . Worried About Charity fundraiser in the Last Year: Not on file  . Ran Out of Food in the Last Year: Not on file  Transportation Needs:   . Lack of Transportation (Medical): Not on file  . Lack of Transportation (Non-Medical): Not on file  Physical Activity:   . Days of Exercise per Week: Not on file  . Minutes of Exercise per Session: Not on file  Stress:   . Feeling of Stress : Not on file  Social Connections:   . Frequency of Communication with Friends and Family: Not on file  . Frequency of Social Gatherings with Friends and Family: Not on file  . Attends Religious Services: Not on file  . Active Member of Clubs or  Organizations: Not on file  . Attends Archivist Meetings: Not on file  . Marital Status: Not on file  Intimate Partner Violence:   . Fear of Current or Ex-Partner: Not on file  . Emotionally Abused: Not on file  . Physically Abused: Not on file  . Sexually Abused: Not on file    Past Surgical History:  Procedure Laterality Date  . APPENDECTOMY    . REFRACTIVE SURGERY     Left Eye  . surgical sterilization  40 years ago    Family History  Problem Relation Age of Onset  . Diabetes Mother   . Hypertension Father   . Depression Other   . Cancer Other        laryngeal  . Diabetes Other     Allergies  Allergen Reactions  . Ace Inhibitors Cough  . Losartan Cough  . Penicillins Hives  . Sulfa Antibiotics Hives  . Sulfonamide Derivatives     Current Outpatient Medications on File Prior to Visit  Medication Sig Dispense Refill  . albuterol (PROAIR HFA) 108 (90 Base) MCG/ACT inhaler Inhale 2 puffs into the lungs every 6 (six) hours as needed for wheezing or shortness of breath. 18 g 2  . amLODipine (  NORVASC) 5 MG tablet Take 1 tablet (5 mg total) by mouth daily. 90 tablet 1  . aspirin EC 81 MG tablet Take 81 mg by mouth daily.    Marland Kitchen atorvastatin (LIPITOR) 10 MG tablet TAKE 1 TABLET BY MOUTH  DAILY 90 tablet 1  . b complex vitamins tablet Take 1 tablet by mouth daily.    . Blood Glucose Monitoring Suppl (ONETOUCH VERIO FLEX SYSTEM) w/Device KIT 1 each by Does not apply route 2 (two) times daily. Use Onetouch verio flex to check blood sugar twice daily.    . canagliflozin (INVOKANA) 100 MG TABS tablet Take 100 mg by mouth daily before breakfast.    . cyclobenzaprine (FLEXERIL) 10 MG tablet TAKE 1 TABLET BY MOUTH TWO  TIMES DAILY AS NEEDED FOR  MUSCLE SPASM 180 tablet 0  . DULoxetine (CYMBALTA) 30 MG capsule TAKE 1 CAPSULE BY MOUTH  DAILY 90 capsule 1  . famotidine (PEPCID) 20 MG tablet TAKE 1 TABLET BY MOUTH AT  BEDTIME 90 tablet 3  . fluconazole (DIFLUCAN) 150 MG tablet  Take one tablet by mouth as needed for yeast infection 3 tablet 0  . fluticasone (FLONASE) 50 MCG/ACT nasal spray Place 2 sprays into both nostrils daily. 16 g 6  . fluticasone (FLOVENT HFA) 110 MCG/ACT inhaler Inhale 2 puffs into the lungs 2 (two) times daily. 1 Inhaler 5  . gabapentin (NEURONTIN) 300 MG capsule TAKE 1 CAPSULE BY MOUTH 4  TIMES DAILY 360 capsule 0  . glimepiride (AMARYL) 4 MG tablet TAKE 1 TABLET BY MOUTH  TWICE DAILY 180 tablet 1  . glucose blood test strip Use OneTouch Verio Flex test strips as instructed to check blood sugar twice daily. 200 each 3  . Insulin Detemir (LEVEMIR FLEXPEN) 100 UNIT/ML Pen Inject 12 Units into the skin at bedtime.  15 mL 3  . insulin lispro (HUMALOG KWIKPEN) 100 UNIT/ML KiwkPen 8 units with breakfast and lunch, 4 units at evening meal    . Insulin Pen Needle 32G X 4 MM MISC Use 3 per day 100 each 3  . latanoprost (XALATAN) 0.005 % ophthalmic solution Place 1 drop into both eyes at bedtime.    Marland Kitchen levocetirizine (XYZAL) 5 MG tablet TAKE 1 TABLET BY MOUTH  EVERY EVENING 90 tablet 1  . levothyroxine (SYNTHROID) 25 MCG tablet TAKE 1 TABLET BY MOUTH  DAILY BEFORE BREAKFAST 90 tablet 3  . metFORMIN (GLUCOPHAGE) 1000 MG tablet TAKE 1 TABLET BY MOUTH  TWICE DAILY WITH A MEAL 180 tablet 1  . metoprolol succinate (TOPROL-XL) 100 MG 24 hr tablet TAKE 1 TABLET BY MOUTH  DAILY WITH OR IMMEDIATELY  FOLLOWING A MEAL 90 tablet 1  . montelukast (SINGULAIR) 10 MG tablet TAKE 1 TABLET BY MOUTH AT  BEDTIME 90 tablet 1  . Multiple Vitamins-Minerals (HAIR SKIN AND NAILS FORMULA PO) Take by mouth daily.    . niacin (NIASPAN) 500 MG CR tablet Take 2 tablets (1,000 mg total) by mouth at bedtime. 90 tablet 1  . NONFORMULARY OR COMPOUNDED ITEM Lumbar support belt  #1  As directed 1 each 0  . nystatin ointment (MYCOSTATIN) APPLY TOPICALLY TWO TIMES  DAILY AS NEEDED 90 g 0  . OneTouch Delica Lancets 16X MISC 1 each by Does not apply route 2 (two) times daily. Use OneTouch Delica  lancets to check blood sugar twice daily. 200 each 3  . pantoprazole (PROTONIX) 40 MG tablet TAKE 1 TABLET BY MOUTH  DAILY 90 tablet 1  . timolol (BETIMOL) 0.5 %  ophthalmic solution Place 1 drop into both eyes 2 (two) times daily.    Marland Kitchen triamcinolone (KENALOG) 0.1 % paste Use as directed 1 application in the mouth or throat 2 (two) times daily. 15 g 1  . vitamin E 400 UNIT capsule Take 400 Units by mouth daily.    . [DISCONTINUED] atorvastatin (LIPITOR) 10 MG tablet TAKE 1 TABLET BY MOUTH  EVERY DAY 90 tablet 1  . [DISCONTINUED] DULoxetine (CYMBALTA) 30 MG capsule TAKE 1 CAPSULE BY MOUTH  DAILY 90 capsule 1  . [DISCONTINUED] glimepiride (AMARYL) 4 MG tablet TAKE 1 TABLET BY MOUTH  TWICE A DAY 180 tablet 1  . [DISCONTINUED] metFORMIN (GLUCOPHAGE) 1000 MG tablet TAKE 1 TABLET BY MOUTH TWO  TIMES DAILY WITH A MEAL 180 tablet 1  . [DISCONTINUED] metoprolol succinate (TOPROL-XL) 100 MG 24 hr tablet TAKE 1 TABLET BY MOUTH  DAILY WITH OR IMMEDIATELY  FOLLOWING A MEAL 90 tablet 1  . [DISCONTINUED] montelukast (SINGULAIR) 10 MG tablet TAKE 1 TABLET BY MOUTH AT  BEDTIME 90 tablet 1  . [DISCONTINUED] pantoprazole (PROTONIX) 40 MG tablet TAKE 1 TABLET BY MOUTH  DAILY 90 tablet 1   No current facility-administered medications on file prior to visit.    BP 113/66 (BP Location: Left Arm, Patient Position: Sitting, Cuff Size: Small)   Pulse 87   Temp (!) 96.5 F (35.8 C) (Temporal)   Resp 16   Wt 133 lb (60.3 kg)   SpO2 100%   BMI 27.80 kg/m       Objective:   Physical Exam Constitutional:      Appearance: She is well-developed.  Neck:     Thyroid: No thyromegaly.  Cardiovascular:     Rate and Rhythm: Normal rate and regular rhythm.     Heart sounds: Normal heart sounds. No murmur.  Pulmonary:     Effort: Pulmonary effort is normal. No respiratory distress.     Breath sounds: Normal breath sounds. No wheezing.  Musculoskeletal:     Cervical back: Neck supple.     Comments: + left hip/groin  pain with passive ROM of the left leg  + some swelling noted of the LLE  + tenderness to palpation of the left calf.   Skin:    General: Skin is warm and dry.  Neurological:     Mental Status: She is alert and oriented to person, place, and time.  Psychiatric:        Behavior: Behavior normal.        Thought Content: Thought content normal.        Judgment: Judgment normal.           Assessment & Plan:  Left groin pain- ? Pelvic fracture/?hip pain.  Obtain pelvic and Left hip x-ray.  Left calf pain/swelling- will obtain LLE doppler to rule out DVT.   Chronic cough- advised pt to follow up with Dr Melvyn Novas for this since he has been following. I advised her against chronic use of tussionex due to increased risk of falls/risk of dependence.   This visit occurred during the SARS-CoV-2 public health emergency.  Safety protocols were in place, including screening questions prior to the visit, additional usage of staff PPE, and extensive cleaning of exam room while observing appropriate contact time as indicated for disinfecting solutions.

## 2019-03-10 NOTE — Patient Instructions (Addendum)
Please schedule a follow up with Dr. Melvyn Novas for your chronic cough.   Please complete ultrasound and x-rays on the first floor.

## 2019-03-11 ENCOUNTER — Ambulatory Visit (HOSPITAL_BASED_OUTPATIENT_CLINIC_OR_DEPARTMENT_OTHER)
Admission: RE | Admit: 2019-03-11 | Discharge: 2019-03-11 | Disposition: A | Discharge status: Home / Self Care | Payer: Medicare Other | Source: Ambulatory Visit | Attending: Family | Admitting: Family

## 2019-03-11 DIAGNOSIS — M7989 Other specified soft tissue disorders: Secondary | ICD-10-CM | POA: Diagnosis not present

## 2019-03-13 ENCOUNTER — Telehealth: Payer: Self-pay | Admitting: Family

## 2019-03-13 DIAGNOSIS — R1032 Left lower quadrant pain: Secondary | ICD-10-CM

## 2019-03-13 NOTE — Telephone Encounter (Signed)
Patient advised of results, she verbalized understanding. She was very hesitant about the referral and said she may wait and see if she gets better.

## 2019-03-13 NOTE — Telephone Encounter (Signed)
Please advise pt that I reviewed her x-rays. No obvious hip of pelvic fracture. Ultrasound negative for clot. I am going to refer her to sports medicine for her groin pain and further evaluation.

## 2019-03-15 ENCOUNTER — Other Ambulatory Visit: Payer: Medicare Other

## 2019-03-15 ENCOUNTER — Other Ambulatory Visit (INDEPENDENT_AMBULATORY_CARE_PROVIDER_SITE_OTHER): Payer: Medicare Other

## 2019-03-15 DIAGNOSIS — E1165 Type 2 diabetes mellitus with hyperglycemia: Secondary | ICD-10-CM

## 2019-03-15 DIAGNOSIS — Z794 Long term (current) use of insulin: Secondary | ICD-10-CM

## 2019-03-15 LAB — BASIC METABOLIC PANEL
BUN: 11 mg/dL (ref 6–23)
CO2: 28 mEq/L (ref 19–32)
Calcium: 9.4 mg/dL (ref 8.4–10.5)
Chloride: 101 mEq/L (ref 96–112)
Creatinine, Ser: 0.73 mg/dL (ref 0.40–1.20)
GFR: 76.25 mL/min (ref >60.00)
Glucose, Bld: 118 mg/dL — ABNORMAL HIGH (ref 70–99)
Potassium: 4.4 mEq/L (ref 3.5–5.1)
Sodium: 138 mEq/L (ref 135–145)

## 2019-03-15 LAB — HEMOGLOBIN A1C: Hgb A1c MFr Bld: 7.4 % — ABNORMAL HIGH (ref 4.6–6.5)

## 2019-03-20 ENCOUNTER — Telehealth: Payer: Self-pay

## 2019-03-20 NOTE — Telephone Encounter (Signed)
Pharmacy calling to see if form they faxed has been reviewed by Dr. Rometta Emery gave fax number and was told it would be faxed again today-fyi

## 2019-03-20 NOTE — Telephone Encounter (Signed)
Noted  

## 2019-03-22 NOTE — Telephone Encounter (Signed)
[facility] requesting the form still be faxed once Dr. Dwyane Dee has signed-FYI

## 2019-03-22 NOTE — Telephone Encounter (Signed)
Once signed, this will be faxed.

## 2019-03-23 ENCOUNTER — Other Ambulatory Visit: Payer: Self-pay

## 2019-03-24 ENCOUNTER — Ambulatory Visit: Payer: Medicare Other | Admitting: Endocrinology

## 2019-03-27 ENCOUNTER — Encounter: Payer: Self-pay | Admitting: Endocrinology

## 2019-03-27 ENCOUNTER — Ambulatory Visit (INDEPENDENT_AMBULATORY_CARE_PROVIDER_SITE_OTHER): Payer: Medicare Other | Admitting: Endocrinology

## 2019-03-27 ENCOUNTER — Other Ambulatory Visit: Payer: Self-pay

## 2019-03-27 VITALS — BP 130/80 | HR 89 | Ht <= 58 in | Wt 131.8 lb

## 2019-03-27 DIAGNOSIS — E1165 Type 2 diabetes mellitus with hyperglycemia: Secondary | ICD-10-CM

## 2019-03-27 DIAGNOSIS — E039 Hypothyroidism, unspecified: Secondary | ICD-10-CM | POA: Diagnosis not present

## 2019-03-27 DIAGNOSIS — I10 Essential (primary) hypertension: Secondary | ICD-10-CM | POA: Diagnosis not present

## 2019-03-27 DIAGNOSIS — Z794 Long term (current) use of insulin: Secondary | ICD-10-CM | POA: Diagnosis not present

## 2019-03-27 DIAGNOSIS — E038 Other specified hypothyroidism: Secondary | ICD-10-CM

## 2019-03-27 LAB — GLUCOSE, POCT (MANUAL RESULT ENTRY): POC Glucose: 255 mg/dl — AB (ref 70–99)

## 2019-03-27 NOTE — Patient Instructions (Signed)
Check blood sugars on waking up 3 days a week  Also check blood sugars about 2 hours after meals and do this after different meals by rotation  Recommended blood sugar levels on waking up are 90-130 and about 2 hours after meal is 130-160  Please bring your blood sugar monitor to each visit, thank you  Take Humalog when starting to eat

## 2019-03-27 NOTE — Progress Notes (Signed)
Patient ID: Mary Flynn, female   DOB: [birth year], 85 y.o.   MRN: [id number]   Reason for Appointment: Diabetes and general follow-up   History of Present Illness   Diagnosis: Type 2 DIABETES MELITUS, date of diagnosis:   1995    Previous history: She has been on various regimens of hypoglycemic drugs over the last several years including Actos, Amaryl and Januvia. Because of cost her Januvia was stopped and Actos was stopped because of potential weight gain She was initially started on Levemir insulin because of fasting hyperglycemia in 2009 and the dose has been adjusted periodically In the last year she is also quite mealtime coverage because of postprandial hyperglycemia despite usually eating a low carbohydrate diet   Recent history:   Insulin regimen: Levemir 12 units hs;  Humalog 8 at breakfast, 8 at lunch and 4 acs  Oral hypoglycemic drugs: Amaryl 2 mg twice a day, metformin, Invokana 157m in am  Current blood sugar patterns and problems identified: Her A1c is slightly better at 7.4   She has been checking her blood sugars only in the mornings and not at different times as recommended several times  She was told to stop her Amaryl in the morning on the last visit because of symptoms of weakness during the day before meals  However she still takes Amaryl in the morning and does not think she has any weak spells  She however admits that she is having memory problems and will periodically forget to take her Humalog insulin including today before coming here  Recently has been eating mostly 2 meals a day and snack and not 3 meals as before, likely may not eat breakfast until late in the morning  Today with a relatively higher carbohydrate meal she did not take her Humalog and her blood sugar is now 255 after eating  She thinks some of the higher readings in the mornings are from late evening snacks  She however takes her Levemir daily and has not changed her dose   She is concerned about the cost of [place] was [place] Also appears to be gaining some weight since last year  Has only done occasional exercise with her indoor exercise equipment     Side effects from medications: None.                Monitors blood glucose: Once  a day.    Glucometer:  One Touch ultra 2        Blood Glucose readings from meter download:    PRE-MEAL Fasting Lunch Dinner Bedtime Overall  Glucose range:  86-175      Mean/median:      125   Previous readings:   PRE-MEAL Fasting Lunch Dinner Bedtime Overall  Glucose range:  75-181      Mean/median:  111     111    Meals: 3 meals per day she is a vegetarian. Trying to get some protein at most meals including dairy products and tofu  Has breakfast at 10 am,  lunch around 2 PM, dinner at 8 PM           Last visit with dietitian: 2009   Wt Readings from Last 3 Encounters:  03/27/19 131 lb 12.8 oz (59.8 kg)  03/10/19 133 lb (60.3 kg)  01/24/19 129 lb (58.5 kg)    LABS:  Lab Results  Component Value Date   HGBA1C 7.4 (H) 03/15/2019  HGBA1C 7.6 (H) 12/08/2018   HGBA1C 8.0 (A) 07/11/2018   Lab Results  Component Value Date   MICROALBUR 3.8 (H) 07/08/2018   LDLCALC 43 12/08/2018   CREATININE 0.73 03/15/2019    Other issues discussed today: See review of systems   Allergies as of 03/27/2019      Reactions   Ace Inhibitors Cough   Losartan Cough   Penicillins Hives   Sulfa Antibiotics Hives   Sulfonamide Derivatives       Medication List       Accurate as of March 27, 2019  2:17 PM. If you have any questions, ask your nurse or doctor.        albuterol 108 (90 Base) MCG/ACT inhaler Commonly known as: ProAir HFA Inhale 2 puffs into the lungs every 6 (six) hours as needed for wheezing or shortness of breath.   amLODipine 5 MG tablet Commonly known as: NORVASC Take 1 tablet (5 mg total) by mouth daily.   aspirin EC 81 MG tablet Take 81 mg by mouth  daily.   atorvastatin 10 MG tablet Commonly known as: LIPITOR TAKE 1 TABLET BY MOUTH  DAILY   b complex vitamins tablet Take 1 tablet by mouth daily.   cyclobenzaprine 10 MG tablet Commonly known as: FLEXERIL TAKE 1 TABLET BY MOUTH TWO  TIMES DAILY AS NEEDED FOR  MUSCLE SPASM   DULoxetine 30 MG capsule Commonly known as: CYMBALTA TAKE 1 CAPSULE BY MOUTH  DAILY   famotidine 20 MG tablet Commonly known as: PEPCID TAKE 1 TABLET BY MOUTH AT  BEDTIME   fluconazole 150 MG tablet Commonly known as: DIFLUCAN Take one tablet by mouth as needed for yeast infection   fluticasone 110 MCG/ACT inhaler Commonly known as: Flovent HFA Inhale 2 puffs into the lungs 2 (two) times daily.   fluticasone 50 MCG/ACT nasal spray Commonly known as: FLONASE Place 2 sprays into both nostrils daily.   gabapentin 300 MG capsule Commonly known as: NEURONTIN TAKE 1 CAPSULE BY MOUTH 4  TIMES DAILY   glimepiride 4 MG tablet Commonly known as: AMARYL TAKE 1 TABLET BY MOUTH  TWICE DAILY   glucose blood test strip Use OneTouch Verio Flex test strips as instructed to check blood sugar twice daily.   HAIR SKIN AND NAILS FORMULA PO Take by mouth daily.   HumaLOG KwikPen 100 UNIT/ML KiwkPen Generic drug: insulin lispro 8 units with breakfast and lunch, 4 units at evening meal   Insulin Pen Needle 32G X 4 MM Misc Use 3 per day   Invokana 100 MG Tabs tablet Generic drug: canagliflozin Take 100 mg by mouth daily before breakfast.   latanoprost 0.005 % ophthalmic solution Commonly known as: XALATAN Place 1 drop into both eyes at bedtime.   Levemir FlexPen 100 UNIT/ML Pen Generic drug: Insulin Detemir Inject 12 Units into the skin at bedtime.   levocetirizine 5 MG tablet Commonly known as: XYZAL TAKE 1 TABLET BY MOUTH  EVERY EVENING   levothyroxine 25 MCG tablet Commonly known as: SYNTHROID TAKE 1 TABLET BY MOUTH  DAILY BEFORE BREAKFAST   metFORMIN 1000 MG tablet Commonly known as:  GLUCOPHAGE TAKE 1 TABLET BY MOUTH  TWICE DAILY WITH A MEAL   metoprolol succinate 100 MG 24 hr tablet Commonly known as: TOPROL-XL TAKE 1 TABLET BY MOUTH  DAILY WITH OR IMMEDIATELY  FOLLOWING A MEAL   montelukast 10 MG tablet Commonly known as: SINGULAIR TAKE 1 TABLET BY MOUTH AT  BEDTIME   niacin 500 MG CR  tablet Commonly known as: NIASPAN Take 2 tablets (1,000 mg total) by mouth at bedtime.   NONFORMULARY OR COMPOUNDED ITEM Lumbar support belt  #1  As directed   nystatin ointment Commonly known as: MYCOSTATIN APPLY TOPICALLY TWO TIMES  DAILY AS NEEDED   OneTouch Delica Lancets 38G Misc 1 each by Does not apply route 2 (two) times daily. Use OneTouch Delica lancets to check blood sugar twice daily.   OneTouch Verio Flex System w/Device Kit 1 each by Does not apply route 2 (two) times daily. Use Onetouch verio flex to check blood sugar twice daily.   pantoprazole 40 MG tablet Commonly known as: PROTONIX TAKE 1 TABLET BY MOUTH  DAILY   timolol 0.5 % ophthalmic solution Commonly known as: BETIMOL Place 1 drop into both eyes 2 (two) times daily.   triamcinolone 0.1 % paste Commonly known as: KENALOG Use as directed 1 application in the mouth or throat 2 (two) times daily.   vitamin E 180 MG (400 UNITS) capsule Take 400 Units by mouth daily.       Allergies:  Allergies  Allergen Reactions  . Ace Inhibitors Cough  . Losartan Cough  . Penicillins Hives  . Sulfa Antibiotics Hives  . Sulfonamide Derivatives     Past Medical History:  Diagnosis Date  . Diabetes mellitus   . Hyperlipidemia   . Hypertension   . Left rib fracture 02/18/2014    Past Surgical History:  Procedure Laterality Date  . APPENDECTOMY    . REFRACTIVE SURGERY     Left Eye  . surgical sterilization  40 years ago    Family History  Problem Relation Age of Onset  . Diabetes Mother   . Hypertension Father   . Depression Other   . Cancer Other        laryngeal  . Diabetes Other      Social History:  reports that she has never smoked. She has never used smokeless tobacco. She reports that she does not drink alcohol or use drugs.  Review of Systems:   HYPOTHYROIDISM:  She is has been mild and not clear if she had symptoms except mild hair loss Has been started on treatment by PCP and she feels fairly good now She has lost weight for other reasons  Continues to be on 25 g of levothyroxine  TSH last year was normal  Lab Results  Component Value Date   TSH 2.73 12/08/2018   TSH 2.56 09/23/2018   TSH 2.33 10/29/2017   FREET4 1.17 12/08/2018   FREET4 1.06 06/19/2016   FREET4 1.12 06/10/2016     Hypertension:    Previously controlled with valsartan, now only taking metoprolol  BP Readings from Last 3 Encounters:  03/27/19 130/80  03/10/19 113/66  01/24/19 125/60    Lab Results  Component Value Date   CREATININE 0.73 03/15/2019   BUN 11 03/15/2019   NA 138 03/15/2019   K 4.4 03/15/2019   CL 101 03/15/2019   CO2 28 03/15/2019    Lipids: Has had diabetic dyslipidemia,  well controlled and followed by her PCP   Lab Results  Component Value Date   CHOL 110 12/08/2018   HDL 49.20 12/08/2018   LDLCALC 43 12/08/2018   TRIG 88.0 12/08/2018   CHOLHDL 2 12/08/2018     Diabetic foot exam done in 11/2018  Diabetic foot exam showed normal monofilament sensation in the toes and plantar surfaces, no skin lesions or ulcers on the feet and normal pedal pulses  Last eye exam documented on 02/26/2017     Examination:   BP 130/80 (BP Location: Left Arm, Patient Position: Sitting, Cuff Size: Normal)   Pulse 89   Ht _0  (1.473 m)   Wt 131 lb 12.8 oz (59.8 kg)   SpO2 93%   BMI 27.55 kg/m   Body mass index is 27.55 kg/m.     ASSESSMENT/ PLAN:   Diabetes type 2 insulin requiring  See history of present illness for detailed discussion of  current management, blood sugar patterns and problems identified  Her A1c is 7.4  For her age and  chronic medical problems her blood sugar control is still fairly good She still has some significant high postprandial readings contributing to her A1c since her fasting readings only averaging about 125 for the last 30 days Also likely benefiting from continued Invokana Difficult to assess her postprandial readings and she does not monitor them at all despite reminders  Also because of memory issues she is forgetting to take her Humalog and not clear how often No hypoglycemia She was told to stop Amaryl in the morning previously but she did not do so and frequently does not follow instructions   Recommendations:   She was again given instructions on checking her blood sugars after meals  She was given a flowsheet to keep a record of her mealtime insulin, blood sugars and bring to each visit  If she has readings below 100 either in the morning or 120 after meals will need to reduce her insulin doses  Encouraged her to use an exercise regimen with her bike or other equipment at least every other day  She needs to switch to the One Touch Verio monitor since likely the old meter may not be accurate  HYPOTHYROIDISM: Likely not symptomatic and only taking 25 mcg but not having any intolerance to this  LIPIDS: Adequately controlled as of the last measurement  Hypertension: Well-controlled with only metoprolol and also benefiting from [employer] issues: She needs to discuss with PCP  Patient Instructions  Check blood sugars on waking up 3 days a week  Also check blood sugars about 2 hours after meals and do this after different meals by rotation  Recommended blood sugar levels on waking up are 90-130 and about 2 hours after meal is 130-160  Please bring your blood sugar monitor to each visit, thank you  Take Humalog when starting to eat        Elayne Snare 03/27/2019, 2:17 PM

## 2019-04-17 NOTE — Progress Notes (Signed)
Virtual Visit via Audio Note  I connected with patient on 04/18/19 at 10:15 AM EST by audio enabled telemedicine application and verified that I am speaking with the correct person using two identifiers.   THIS ENCOUNTER IS A VIRTUAL VISIT DUE TO COVID-19 - PATIENT WAS NOT SEEN IN THE OFFICE. PATIENT HAS CONSENTED TO VIRTUAL VISIT / TELEMEDICINE VISIT   Location of patient: home  Location of provider: office  I discussed the limitations of evaluation and management by telemedicine and the availability of in person appointments. The patient expressed understanding and agreed to proceed.   Subjective:   Mary Flynn is a 85 y.o. female who presents for Medicare Annual (Subsequent) preventive examination.  Review of Systems:  Cardiac Risk Factors include: advanced age (>23mn, >>53women);hypertensionHome Safety/Smoke Alarms: Feels safe in home. Smoke alarms in place.  Lives w/ husbands. 1 story home.   Female:        Mammo-   10/06/18    Dexa scan- 12/31/16           Objective:     Vitals: Unable to assess. This visit is enabled though telemedicine due to Covid 19.   Advanced Directives 04/18/2019 06/01/2017 12/19/2015 06/13/2015 04/25/2015 04/03/2015 06/12/2011  Does Patient Have a Medical Advance Directive? No Yes Yes No Yes No Patient has advance directive, copy not in chart  Type of Advance Directive - - [place] will - - - Living will  Does patient want to make changes to medical advance directive? No - Patient declined No - Patient declined No - Patient declined - - - -  Copy of [place]? - - No - copy requested - - - -  Would patient like information on creating a medical advance directive? - - - Yes - Educational materials given - No - patient declined information -  Pre-existing out of facility DNR order (yellow form or pink MOST form) - - - - - - No    Tobacco Social History   Tobacco Use  Smoking Status Never Smoker    Smokeless Tobacco Never Used     Counseling given: Not Answered   Clinical Intake: Pain : No/denies pain     Past Medical History:  Diagnosis Date  . Diabetes mellitus   . Hyperlipidemia   . Hypertension   . Left rib fracture 02/18/2014   Past Surgical History:  Procedure Laterality Date  . APPENDECTOMY    . REFRACTIVE SURGERY     Left Eye  . surgical sterilization  40 years ago   Family History  Problem Relation Age of Onset  . Diabetes Mother   . Hypertension Father   . Depression Other   . Cancer Other        laryngeal  . Diabetes Other    Social History   Socioeconomic History  . Marital status: Married    Spouse name: SJacelyn Cuen . Number of children: 2  . Years of education: College  . Highest education level: Not on file  Occupational History  . Occupation: housewife  Tobacco Use  . Smoking status: Never Smoker  . Smokeless tobacco: Never Used  Substance and Sexual Activity  . Alcohol use: No  . Drug use: No  . Sexual activity: Not Currently  Other Topics Concern  . Not on file  Social History Narrative   Patient lives at home with spouse.   Caffeine Use: rarely   Social Determinants of Health  Financial Resource Strain: Low Risk   . Difficulty of Paying Living Expenses: Not hard at all  Food Insecurity:   . Worried About Charity fundraiser in the Last Year: Not on file  . Ran Out of Food in the Last Year: Not on file  Transportation Needs: No Transportation Needs  . Lack of Transportation (Medical): No  . Lack of Transportation (Non-Medical): No  Physical Activity:   . Days of Exercise per Week: Not on file  . Minutes of Exercise per Session: Not on file  Stress:   . Feeling of Stress : Not on file  Social Connections:   . Frequency of Communication with Friends and Family: Not on file  . Frequency of Social Gatherings with Friends and Family: Not on file  . Attends Religious Services: Not on file  . Active Member of Clubs or  Organizations: Not on file  . Attends Archivist Meetings: Not on file  . Marital Status: Not on file    Outpatient Encounter Medications as of 04/18/2019  Medication Sig  . albuterol (PROAIR HFA) 108 (90 Base) MCG/ACT inhaler Inhale 2 puffs into the lungs every 6 (six) hours as needed for wheezing or shortness of breath.  Marland Kitchen amLODipine (NORVASC) 5 MG tablet Take 1 tablet (5 mg total) by mouth daily.  Marland Kitchen aspirin EC 81 MG tablet Take 81 mg by mouth daily.  Marland Kitchen atorvastatin (LIPITOR) 10 MG tablet TAKE 1 TABLET BY MOUTH  DAILY  . b complex vitamins tablet Take 1 tablet by mouth daily.  . Blood Glucose Monitoring Suppl (ONETOUCH VERIO FLEX SYSTEM) w/Device KIT 1 each by Does not apply route 2 (two) times daily. Use Onetouch verio flex to check blood sugar twice daily.  . canagliflozin (INVOKANA) 100 MG TABS tablet Take 100 mg by mouth daily before breakfast.  . cyclobenzaprine (FLEXERIL) 10 MG tablet TAKE 1 TABLET BY MOUTH TWO  TIMES DAILY AS NEEDED FOR  MUSCLE SPASM  . DULoxetine (CYMBALTA) 30 MG capsule TAKE 1 CAPSULE BY MOUTH  DAILY  . famotidine (PEPCID) 20 MG tablet TAKE 1 TABLET BY MOUTH AT  BEDTIME  . fluconazole (DIFLUCAN) 150 MG tablet Take one tablet by mouth as needed for yeast infection  . fluticasone (FLONASE) 50 MCG/ACT nasal spray Place 2 sprays into both nostrils daily.  . fluticasone (FLOVENT HFA) 110 MCG/ACT inhaler Inhale 2 puffs into the lungs 2 (two) times daily.  Marland Kitchen gabapentin (NEURONTIN) 300 MG capsule TAKE 1 CAPSULE BY MOUTH 4  TIMES DAILY  . glimepiride (AMARYL) 4 MG tablet TAKE 1 TABLET BY MOUTH  TWICE DAILY  . glucose blood test strip Use OneTouch Verio Flex test strips as instructed to check blood sugar twice daily.  . Insulin Detemir (LEVEMIR FLEXPEN) 100 UNIT/ML Pen Inject 12 Units into the skin at bedtime.   . insulin lispro (HUMALOG KWIKPEN) 100 UNIT/ML KiwkPen 8 units with breakfast and lunch, 4 units at evening meal  . Insulin Pen Needle 32G X 4 MM MISC Use  3 per day  . latanoprost (XALATAN) 0.005 % ophthalmic solution Place 1 drop into both eyes at bedtime.  Marland Kitchen levocetirizine (XYZAL) 5 MG tablet TAKE 1 TABLET BY MOUTH  EVERY EVENING  . levothyroxine (SYNTHROID) 25 MCG tablet TAKE 1 TABLET BY MOUTH  DAILY BEFORE BREAKFAST  . metFORMIN (GLUCOPHAGE) 1000 MG tablet TAKE 1 TABLET BY MOUTH  TWICE DAILY WITH A MEAL  . metoprolol succinate (TOPROL-XL) 100 MG 24 hr tablet TAKE 1 TABLET BY MOUTH  DAILY WITH OR IMMEDIATELY  FOLLOWING A MEAL  . montelukast (SINGULAIR) 10 MG tablet TAKE 1 TABLET BY MOUTH AT  BEDTIME  . Multiple Vitamins-Minerals (HAIR SKIN AND NAILS FORMULA PO) Take by mouth daily.  . niacin (NIASPAN) 500 MG CR tablet Take 2 tablets (1,000 mg total) by mouth at bedtime.  . NONFORMULARY OR COMPOUNDED ITEM Lumbar support belt  #1  As directed  . nystatin ointment (MYCOSTATIN) APPLY TOPICALLY TWO TIMES  DAILY AS NEEDED  . OneTouch Delica Lancets 06Y MISC 1 each by Does not apply route 2 (two) times daily. Use OneTouch Delica lancets to check blood sugar twice daily.  . pantoprazole (PROTONIX) 40 MG tablet TAKE 1 TABLET BY MOUTH  DAILY  . timolol (BETIMOL) 0.5 % ophthalmic solution Place 1 drop into both eyes 2 (two) times daily.  Marland Kitchen triamcinolone (KENALOG) 0.1 % paste Use as directed 1 application in the mouth or throat 2 (two) times daily.  . vitamin E 400 UNIT capsule Take 400 Units by mouth daily.  . [DISCONTINUED] atorvastatin (LIPITOR) 10 MG tablet TAKE 1 TABLET BY MOUTH  EVERY DAY  . [DISCONTINUED] DULoxetine (CYMBALTA) 30 MG capsule TAKE 1 CAPSULE BY MOUTH  DAILY  . [DISCONTINUED] glimepiride (AMARYL) 4 MG tablet TAKE 1 TABLET BY MOUTH  TWICE A DAY  . [DISCONTINUED] metFORMIN (GLUCOPHAGE) 1000 MG tablet TAKE 1 TABLET BY MOUTH TWO  TIMES DAILY WITH A MEAL  . [DISCONTINUED] metoprolol succinate (TOPROL-XL) 100 MG 24 hr tablet TAKE 1 TABLET BY MOUTH  DAILY WITH OR IMMEDIATELY  FOLLOWING A MEAL  . [DISCONTINUED] montelukast (SINGULAIR) 10 MG  tablet TAKE 1 TABLET BY MOUTH AT  BEDTIME  . [DISCONTINUED] pantoprazole (PROTONIX) 40 MG tablet TAKE 1 TABLET BY MOUTH  DAILY   No facility-administered encounter medications on file as of 04/18/2019.    Activities of Daily Living In your present state of health, do you have any difficulty performing the following activities: 04/18/2019 01/10/2019  Hearing? N N  Vision? N N  Difficulty concentrating or making decisions? N N  Walking or climbing stairs? Y N  Dressing or bathing? N N  Doing errands, shopping? Y N  Preparing Food and eating ? N -  Using the Toilet? N -  In the past six months, have you accidently leaked urine? N -  Do you have problems with loss of bowel control? N -  Managing your Medications? N -  Managing your Finances? N -  Housekeeping or managing your Housekeeping? N -  Some recent data might be hidden    Patient Care Team: [name], NP as PCP - General (Internal Medicine) Elayne Snare, MD as Consulting Physician (Endocrinology) Kristeen Miss, MD as Consulting Physician (Neurosurgery) End, Harrell Gave, MD as Consulting Physician (Cardiology) Warden Fillers, MD as Consulting Physician (Ophthalmology)    Assessment:   This is a routine wellness examination for Casee. Physical assessment deferred to PCP.  Exercise Activities and Dietary recommendations Current Exercise Habits: The patient does not participate in regular exercise at present, Exercise limited by: None identified   Diet (meal preparation, eat out, water intake, caffeinated beverages, dairy products, fruits and vegetables): well balanced, on average, 3 meals per day    Goals    . Blood Pressure < 140/90    . HEMOGLOBIN A1C < 8.0       Fall Risk Fall Risk  04/18/2019 12/25/2016 01/28/2016 12/19/2015 06/13/2015  Falls in the past year? 0 No Yes Yes Yes  Number falls in past yr:  0 - 2 or more 2 or more 1  Injury with Fall? 0 - - No Yes  Risk Factor Category  - - - - High Fall Risk    Comment - - - - Due to back and left leg pain.    Risk for fall due to : - - - Impaired mobility Impaired balance/gait  Follow up Education provided;Falls prevention discussed - Education provided Education provided Education provided;Falls prevention discussed   Depression Screen PHQ 2/9 Scores 04/18/2019 04/18/2019 12/25/2016 12/19/2015  PHQ - 2 Score 0 0 0 0     Cognitive Function Ad8 score reviewed for issues:  Issues making decisions:no  Less interest in hobbies / activities:no  Repeats questions, stories (family complaining):no  Trouble using ordinary gadgets (microwave, computer, phone):no  Forgets the month or year: no  Mismanaging finances: no  Remembering appts:no  Daily problems with thinking and/or memory:no Ad8 score is=0     MMSE - Mini Mental State Exam 12/19/2015 06/13/2015  Orientation to time 5 5  Orientation to Place 5 5  Registration 3 3  Attention/ Calculation 5 5  Recall 3 2  Language- name 2 objects 2 2  Language- repeat 1 1  Language- follow 3 step command 3 3  Language- read & follow direction 1 1  Write a sentence 1 1  Copy design 1 1  Total score 30 29        Immunization History  Administered Date(s) Administered  . Fluad Quad(high Dose 65+) 11/18/2018  . Influenza Split 11/24/2010  . Influenza Whole 11/30/2007, 12/05/2008, 12/04/2009  . Influenza, High Dose Seasonal PF 11/17/2013, 11/08/2014, 11/15/2015, 11/23/2016, 10/29/2017  . Influenza,inj,Quad PF,6+ Mos 11/18/2012  . PPD Test 02/16/2011, 04/14/2011  . Pneumococcal Conjugate-13 02/02/2014  . Pneumococcal Polysaccharide-23 12/09/2007  . Td 10/12/2006  . Zoster 06/04/2010   Screening Tests Health Maintenance  Topic Date Due  . TETANUS/TDAP  10/11/2016  . OPHTHALMOLOGY EXAM  02/26/2018  . URINE MICROALBUMIN  07/08/2019  . HEMOGLOBIN A1C  09/12/2019  . MAMMOGRAM  10/06/2019  . FOOT EXAM  12/12/2019  . INFLUENZA VACCINE  Completed  . DEXA SCAN  Completed  . PNA vac Low  Risk Adult  Completed      Plan:    Please schedule your next medicare wellness visit with me in 1 yr.  Continue to eat heart healthy diet (full of fruits, vegetables, whole grains, lean protein, water--limit salt, fat, and sugar intake) and increase physical activity as tolerated.  Continue doing brain stimulating activities (puzzles, reading, adult coloring books, staying active) to keep memory sharp.   Bring a copy of your living will and/or healthcare power of attorney to your next office visit.    I have personally reviewed and noted the following in the patient's chart:   . Medical and social history . Use of alcohol, tobacco or illicit drugs  . Current medications and supplements . Functional ability and status . Nutritional status . Physical activity . Advanced directives . List of other physicians . Hospitalizations, surgeries, and ER visits in previous 12 months . Vitals . Screenings to include cognitive, depression, and falls . Referrals and appointments  In addition, I have reviewed and discussed with patient certain preventive protocols, quality metrics, and best practice recommendations. A written personalized care plan for preventive services as well as general preventive health recommendations were provided to patient.     Naaman Plummer [place], [place]  04/18/2019

## 2019-04-18 ENCOUNTER — Encounter: Payer: Self-pay | Admitting: *Deleted

## 2019-04-18 ENCOUNTER — Telehealth: Payer: Self-pay

## 2019-04-18 ENCOUNTER — Ambulatory Visit (INDEPENDENT_AMBULATORY_CARE_PROVIDER_SITE_OTHER): Payer: Medicare Other | Admitting: *Deleted

## 2019-04-18 ENCOUNTER — Other Ambulatory Visit: Payer: Self-pay

## 2019-04-18 DIAGNOSIS — Z0000 Encounter for general adult medical examination without abnormal findings: Secondary | ICD-10-CM | POA: Diagnosis not present

## 2019-04-18 NOTE — Telephone Encounter (Signed)
Called pt and informed her of this. A new Rx for Invokana will be sent to pharmacy and pt verbalized understanding that she will continue taking this. Pt denied making a 3 month f/u at this time because she is going to [place] in 3 weeks and she does not know when she will be back in the [place] due to border closings. Pt states that she will call as soon as she gets back and make a follow up visit.

## 2019-04-18 NOTE — Telephone Encounter (Signed)
Pt called and stated that she will be going to [place] in 3 weeks. She currently has 1 week left of Invokana and she can only get her medications through mail order pharmacy.  Pt stated that he daughter (who is already in [place]) has purchased Invokana for her to take when she gets to [place].  Pt would like to know if she can take extra metformin and Glimepiride for 2 of the three weeks before going to [place] rather than getting a full 90 day supply from [facility] and then going to [place] and having another bottle that her daughter has already purchased.

## 2019-04-18 NOTE — Telephone Encounter (Signed)
She cannot take any extra Metformin or glimepiride.  Would prefer that she take Invokana consistently if she can.  Also needs to set up a follow-up appointment for her 98-month visit

## 2019-04-18 NOTE — Patient Instructions (Signed)
Please schedule your next medicare wellness visit with me in 1 yr.  Continue to eat heart healthy diet (full of fruits, vegetables, whole grains, lean protein, water--limit salt, fat, and sugar intake) and increase physical activity as tolerated.  Continue doing brain stimulating activities (puzzles, reading, adult coloring books, staying active) to keep memory sharp.   Bring a copy of your living will and/or healthcare power of attorney to your next office visit.   Mary Flynn , Thank you for taking time to come for your Medicare Wellness Visit. I appreciate your ongoing commitment to your health goals. Please review the following plan we discussed and let me know if I can assist you in the future.   These are the goals we discussed: Goals    . Blood Pressure < 140/90    . HEMOGLOBIN A1C < 8.0       This is a list of the screening recommended for you and due dates:  Health Maintenance  Topic Date Due  . Tetanus Vaccine  10/11/2016  . Eye exam for diabetics  02/26/2018  . Urine Protein Check  07/08/2019  . Hemoglobin A1C  09/12/2019  . Mammogram  10/06/2019  . Complete foot exam   12/12/2019  . Flu Shot  Completed  . DEXA scan (bone density measurement)  Completed  . Pneumonia vaccines  Completed    Preventive Care 105 Years and Older, Female Preventive care refers to lifestyle choices and visits with your health care provider that can promote health and wellness. This includes:  A yearly physical exam. This is also called an annual well check.  Regular dental and eye exams.  Immunizations.  Screening for certain conditions.  Healthy lifestyle choices, such as diet and exercise. What can I expect for my preventive care visit? Physical exam Your health care provider will check:  Height and weight. These may be used to calculate body mass index (BMI), which is a measurement that tells if you are at a healthy weight.  Heart rate and blood pressure.  Your skin for  abnormal spots. Counseling Your health care provider may ask you questions about:  Alcohol, tobacco, and drug use.  Emotional well-being.  Home and relationship well-being.  Sexual activity.  Eating habits.  History of falls.  Memory and ability to understand (cognition).  Work and work Statistician.  Pregnancy and menstrual history. What immunizations do I need?  Influenza (flu) vaccine  This is recommended every year. Tetanus, diphtheria, and pertussis (Tdap) vaccine  You may need a Td booster every 10 years. Varicella (chickenpox) vaccine  You may need this vaccine if you have not already been vaccinated. Zoster (shingles) vaccine  You may need this after age 46. Pneumococcal conjugate (PCV13) vaccine  One dose is recommended after age 30. Pneumococcal polysaccharide (PPSV23) vaccine  One dose is recommended after age 54. Measles, mumps, and rubella (MMR) vaccine  You may need at least one dose of MMR if you were born in 1957 or later. You may also need a second dose. Meningococcal conjugate (MenACWY) vaccine  You may need this if you have certain conditions. Hepatitis A vaccine  You may need this if you have certain conditions or if you travel or work in places where you may be exposed to hepatitis A. Hepatitis B vaccine  You may need this if you have certain conditions or if you travel or work in places where you may be exposed to hepatitis B. Haemophilus influenzae type b (Hib) vaccine  You may  need this if you have certain conditions. You may receive vaccines as individual doses or as more than one vaccine together in one shot (combination vaccines). Talk with your health care provider about the risks and benefits of combination vaccines. What tests do I need? Blood tests  Lipid and cholesterol levels. These may be checked every 5 years, or more frequently depending on your overall health.  Hepatitis C test.  Hepatitis B test. Screening  Lung  cancer screening. You may have this screening every year starting at age 12 if you have a 30-pack-year history of smoking and currently smoke or have quit within the past 15 years.  Colorectal cancer screening. All adults should have this screening starting at age 50 and continuing until age 64. Your health care provider may recommend screening at age 20 if you are at increased risk. You will have tests every 1-10 years, depending on your results and the type of screening test.  Diabetes screening. This is done by checking your blood sugar (glucose) after you have not eaten for a while (fasting). You may have this done every 1-3 years.  Mammogram. This may be done every 1-2 years. Talk with your health care provider about how often you should have regular mammograms.  BRCA-related cancer screening. This may be done if you have a family history of breast, ovarian, tubal, or peritoneal cancers. Other tests  Sexually transmitted disease (STD) testing.  Bone density scan. This is done to screen for osteoporosis. You may have this done starting at age 83. Follow these instructions at home: Eating and drinking  Eat a diet that includes fresh fruits and vegetables, whole grains, lean protein, and low-fat dairy products. Limit your intake of foods with high amounts of sugar, saturated fats, and salt.  Take vitamin and mineral supplements as recommended by your health care provider.  Do not drink alcohol if your health care provider tells you not to drink.  If you drink alcohol: ? Limit how much you have to 0-1 drink a day. ? Be aware of how much alcohol is in your drink. In the [place] one drink equals one 12 oz bottle of beer (355 mL), one 5 oz glass of wine (148 mL), or one 1 oz glass of hard liquor (44 mL). Lifestyle  Take daily care of your teeth and gums.  Stay active. Exercise for at least 30 minutes on 5 or more days each week.  Do not use any products that contain nicotine or tobacco,  such as cigarettes, e-cigarettes, and chewing tobacco. If you need help quitting, ask your health care provider.  If you are sexually active, practice safe sex. Use a condom or other form of protection in order to prevent STIs (sexually transmitted infections).  Talk with your health care provider about taking a low-dose aspirin or statin. What's next?  Go to your health care provider once a year for a well check visit.  Ask your health care provider how often you should have your eyes and teeth checked.  Stay up to date on all vaccines. This information is not intended to replace advice given to you by your health care provider. Make sure you discuss any questions you have with your health care provider. Document Revised: 02/03/2018 Document Reviewed: 02/03/2018 Elsevier Patient Education  2020 [employer].

## 2019-04-19 ENCOUNTER — Other Ambulatory Visit: Payer: Self-pay

## 2019-04-19 MED ORDER — JARDIANCE 10 MG PO TABS: 10.0000 mg | ORAL_TABLET | Freq: Every day | ORAL | 1 refills | Status: DC

## 2019-04-19 MED ORDER — CANAGLIFLOZIN 100 MG PO TABS: 100.0000 mg | ORAL_TABLET | Freq: Every day | ORAL | 1 refills | Status: DC

## 2019-04-19 NOTE — Telephone Encounter (Signed)
Pt called and stated that she spoke with [facility] and they stated that Invokana is no longer covered by her insurance. They will pay for Jardiance though.  Pt would like an new Rx for Jardiance. She would also like to know how it would affect her if she started taking Jardiance now and then resumed Invokana when she gets to [place], since her daughter has already purchased Invokana for her.

## 2019-04-19 NOTE — Telephone Encounter (Signed)
Please send prescription for 10 mg Jardiance.  Okay to go back to [place] when she needs to

## 2019-04-19 NOTE — Telephone Encounter (Signed)
Called pt and gave her MD message. Pt verbalized understanding and new Rx for Jardiance was sent to pharmacy.

## 2019-04-21 ENCOUNTER — Telehealth: Payer: Self-pay | Admitting: Endocrinology

## 2019-04-21 NOTE — Telephone Encounter (Signed)
Patient requests to be called at ph# [phone] to explain Jardiance.

## 2019-04-24 ENCOUNTER — Other Ambulatory Visit: Payer: Self-pay

## 2019-04-24 MED ORDER — JARDIANCE 10 MG PO TABS: 10.0000 mg | ORAL_TABLET | Freq: Every day | ORAL | 3 refills | Status: DC

## 2019-04-24 NOTE — Telephone Encounter (Signed)
She can use a 30-day prescription but with Medicare will not be able to get a co-pay card.  Also since she has finalized her travel plans she needs to make her follow-up appointment with labs

## 2019-04-24 NOTE — Telephone Encounter (Signed)
The last thing that pt told anyone at this office was that her plans for travelling to [place] are finalized, but her return date is not final.

## 2019-04-24 NOTE — Telephone Encounter (Signed)
Called pt back and she stated that the 90 day refill for Jardiace was too expensive, but she spoke with her insurance company and she can get a 30 day supply with refills from her local [facility] at a cheaper cost. Pt wanted a 30 day supply with refills sent to [facility] on [address] in [place]. This Rx was sent.  Pt also requested a copay/discount card. Do you have one of these Dr. Dwyane Dee?

## 2019-05-10 ENCOUNTER — Encounter: Payer: Self-pay | Admitting: Cardiology

## 2019-05-10 ENCOUNTER — Other Ambulatory Visit: Payer: Self-pay

## 2019-05-10 ENCOUNTER — Ambulatory Visit: Payer: Medicare Other | Admitting: Cardiology

## 2019-05-10 VITALS — BP 124/70 | HR 100 | Ht <= 58 in | Wt 132.0 lb

## 2019-05-10 DIAGNOSIS — E1142 Type 2 diabetes mellitus with diabetic polyneuropathy: Secondary | ICD-10-CM | POA: Diagnosis not present

## 2019-05-10 DIAGNOSIS — E1165 Type 2 diabetes mellitus with hyperglycemia: Secondary | ICD-10-CM | POA: Diagnosis not present

## 2019-05-10 DIAGNOSIS — IMO0002 Reserved for concepts with insufficient information to code with codable children: Secondary | ICD-10-CM

## 2019-05-10 DIAGNOSIS — I10 Essential (primary) hypertension: Secondary | ICD-10-CM | POA: Diagnosis not present

## 2019-05-10 DIAGNOSIS — E1151 Type 2 diabetes mellitus with diabetic peripheral angiopathy without gangrene: Secondary | ICD-10-CM | POA: Diagnosis not present

## 2019-05-10 DIAGNOSIS — E782 Mixed hyperlipidemia: Secondary | ICD-10-CM

## 2019-05-10 NOTE — Progress Notes (Signed)
Cardiology Office Note:    Date:  05/10/2019   ID:  Mary Flynn, DOB 09-18-1934, MRN [id number]  PCP:  [name], NP  Cardiologist:  Jenean Lindau, MD   Referring MD: [name], NP    ASSESSMENT:    1. Essential hypertension   2. DM (diabetes mellitus) type II uncontrolled, periph vascular disorder ([place])   3. DM type 2 with diabetic peripheral neuropathy ([place])   4. Mixed dyslipidemia    PLAN:    In order of problems listed above:  1. Primary prevention stressed with the patient.  Importance of compliance with diet medication stressed and she vocalized understanding. 2. Shortness of breath on exertion: This is only happening when she exerts more than usual.  I discussed this with her at length she is not keen on any evaluation at this time and I respect her wishes.  If things get any worse she will get in touch with me.  She knows to go to the nearest emergency room for any significant concerns.  This does not happen with activities of daily living so she is fine with her quality of life right now. 3. Essential hypertension: Blood pressure is stable 4. Mixed dyslipidemia: I reviewed her recent lipids and found them to be fine.  Diet was emphasized 5. Diabetes mellitus: Discussed with patient to follow primary care recommendations. 6. Patient will be seen in follow-up appointment in 4 months or earlier if the patient has any concerns    Medication Adjustments/Labs and Tests Ordered: Current medicines are reviewed at length with the patient today.  Concerns regarding medicines are outlined above.  No orders of the defined types were placed in this encounter.  No orders of the defined types were placed in this encounter.    Chief Complaint  Patient presents with  . Follow-up    6 Months     History of Present Illness:    Mary Flynn is a 85 y.o. female.  Patient has past medical history of essential hypertension dyslipidemia and diabetes mellitus.   She denies any problems at this time and takes care of activities of daily living.  No chest pain orthopnea or PND.  Only when she is stressed up or exerts over than what she can do she gets some short of breath.  At the time of my evaluation, the patient is alert awake oriented and in no distress.  Her husband is very supportive and accompanies her for this visit.  Past Medical History:  Diagnosis Date  . Diabetes mellitus   . Hyperlipidemia   . Hypertension   . Left rib fracture 02/18/2014    Past Surgical History:  Procedure Laterality Date  . APPENDECTOMY    . REFRACTIVE SURGERY     Left Eye  . surgical sterilization  40 years ago    Current Medications: Current Meds  Medication Sig  . albuterol (PROAIR HFA) 108 (90 Base) MCG/ACT inhaler Inhale 2 puffs into the lungs every 6 (six) hours as needed for wheezing or shortness of breath.  Marland Kitchen amLODipine (NORVASC) 5 MG tablet Take 1 tablet (5 mg total) by mouth daily.  Marland Kitchen aspirin EC 81 MG tablet Take 81 mg by mouth daily.  Marland Kitchen atorvastatin (LIPITOR) 10 MG tablet TAKE 1 TABLET BY MOUTH  DAILY  . b complex vitamins tablet Take 1 tablet by mouth daily.  . Blood Glucose Monitoring Suppl (ONETOUCH VERIO FLEX SYSTEM) w/Device KIT 1 each by Does not apply route 2 (two) times  daily. Use Onetouch verio flex to check blood sugar twice daily.  . cyclobenzaprine (FLEXERIL) 10 MG tablet TAKE 1 TABLET BY MOUTH TWO  TIMES DAILY AS NEEDED FOR  MUSCLE SPASM  . DULoxetine (CYMBALTA) 30 MG capsule TAKE 1 CAPSULE BY MOUTH  DAILY  . empagliflozin (JARDIANCE) 10 MG TABS tablet Take 10 mg by mouth daily. Replaces Invokana  . famotidine (PEPCID) 20 MG tablet TAKE 1 TABLET BY MOUTH AT  BEDTIME  . fluconazole (DIFLUCAN) 150 MG tablet Take one tablet by mouth as needed for yeast infection  . fluticasone (FLONASE) 50 MCG/ACT nasal spray Place 2 sprays into both nostrils daily.  . fluticasone (FLOVENT HFA) 110 MCG/ACT inhaler Inhale 2 puffs into the lungs 2 (two) times  daily.  . gabapentin (NEURONTIN) 300 MG capsule TAKE 1 CAPSULE BY MOUTH 4  TIMES DAILY  . glimepiride (AMARYL) 4 MG tablet TAKE 1 TABLET BY MOUTH  TWICE DAILY  . glucose blood test strip Use OneTouch Verio Flex test strips as instructed to check blood sugar twice daily.  . Insulin Detemir (LEVEMIR FLEXPEN) 100 UNIT/ML Pen Inject 12 Units into the skin at bedtime.   . insulin lispro (HUMALOG KWIKPEN) 100 UNIT/ML KiwkPen 8 units with breakfast and lunch, 4 units at evening meal  . Insulin Pen Needle 32G X 4 MM MISC Use 3 per day  . latanoprost (XALATAN) 0.005 % ophthalmic solution Place 1 drop into both eyes at bedtime.  . levocetirizine (XYZAL) 5 MG tablet TAKE 1 TABLET BY MOUTH  EVERY EVENING  . levothyroxine (SYNTHROID) 25 MCG tablet TAKE 1 TABLET BY MOUTH  DAILY BEFORE BREAKFAST  . metFORMIN (GLUCOPHAGE) 1000 MG tablet TAKE 1 TABLET BY MOUTH  TWICE DAILY WITH A MEAL  . metoprolol succinate (TOPROL-XL) 100 MG 24 hr tablet TAKE 1 TABLET BY MOUTH  DAILY WITH OR IMMEDIATELY  FOLLOWING A MEAL  . montelukast (SINGULAIR) 10 MG tablet TAKE 1 TABLET BY MOUTH AT  BEDTIME  . Multiple Vitamins-Minerals (HAIR SKIN AND NAILS FORMULA PO) Take by mouth daily.  . niacin (NIASPAN) 500 MG CR tablet Take 2 tablets (1,000 mg total) by mouth at bedtime.  . NONFORMULARY OR COMPOUNDED ITEM Lumbar support belt  #1  As directed  . nystatin ointment (MYCOSTATIN) APPLY TOPICALLY TWO TIMES  DAILY AS NEEDED  . OneTouch Delica Lancets 30G MISC 1 each by Does not apply route 2 (two) times daily. Use OneTouch Delica lancets to check blood sugar twice daily.  . pantoprazole (PROTONIX) 40 MG tablet TAKE 1 TABLET BY MOUTH  DAILY  . timolol (BETIMOL) 0.5 % ophthalmic solution Place 1 drop into both eyes 2 (two) times daily.  . triamcinolone (KENALOG) 0.1 % paste Use as directed 1 application in the mouth or throat 2 (two) times daily.  . vitamin E 400 UNIT capsule Take 400 Units by mouth daily.     Allergies:   Ace inhibitors,  Losartan, Penicillins, Sulfa antibiotics, and Sulfonamide derivatives   Social History   Socioeconomic History  . Marital status: Married    Spouse name: Srikanta Clift  . Number of children: 2  . Years of education: College  . Highest education level: Not on file  Occupational History  . Occupation: housewife  Tobacco Use  . Smoking status: Never Smoker  . Smokeless tobacco: Never Used  Substance and Sexual Activity  . Alcohol use: No  . Drug use: No  . Sexual activity: Not Currently  Other Topics Concern  . Not on file  Social   History Narrative   Patient lives at home with spouse.   Caffeine Use: rarely   Social Determinants of Health   Financial Resource Strain: Low Risk   . Difficulty of Paying Living Expenses: Not hard at all  Food Insecurity:   . Worried About Running Out of Food in the Last Year:   . Ran Out of Food in the Last Year:   Transportation Needs: No Transportation Needs  . Lack of Transportation (Medical): No  . Lack of Transportation (Non-Medical): No  Physical Activity:   . Days of Exercise per Week:   . Minutes of Exercise per Session:   Stress:   . Feeling of Stress :   Social Connections:   . Frequency of Communication with Friends and Family:   . Frequency of Social Gatherings with Friends and Family:   . Attends Religious Services:   . Active Member of Clubs or Organizations:   . Attends Club or Organization Meetings:   . Marital Status:      Family History: The patient's family history includes Cancer in an other family member; Depression in an other family member; Diabetes in her mother and another family member; Hypertension in her father.  ROS:   Please see the history of present illness.    All other systems reviewed and are negative.  EKGs/Labs/Other Studies Reviewed:    The following studies were reviewed today: I discussed my findings with the patient at length   Recent Labs: 08/08/2018: Hemoglobin 12.6; Platelets 370.0;  Pro B Natriuretic peptide (BNP) 86.0 12/08/2018: ALT 18; TSH 2.73 03/15/2019: BUN 11; Creatinine, Ser 0.73; Potassium 4.4; Sodium 138  Recent Lipid Panel    Component Value Date/Time   CHOL 110 12/08/2018 0753   TRIG 88.0 12/08/2018 0753   HDL 49.20 12/08/2018 0753   CHOLHDL 2 12/08/2018 0753   VLDL 17.6 12/08/2018 0753   LDLCALC 43 12/08/2018 0753    Physical Exam:    VS:  BP 124/70   Pulse 100   Ht 4' 10" (1.473 m)   Wt 132 lb (59.9 kg)   SpO2 92%   BMI 27.59 kg/m     Wt Readings from Last 3 Encounters:  05/10/19 132 lb (59.9 kg)  03/27/19 131 lb 12.8 oz (59.8 kg)  03/10/19 133 lb (60.3 kg)     GEN: Patient is in no acute distress HEENT: Normal NECK: No JVD; No carotid bruits LYMPHATICS: No lymphadenopathy CARDIAC: Hear sounds regular, 2/6 systolic murmur at the apex. RESPIRATORY:  Clear to auscultation without rales, wheezing or rhonchi  ABDOMEN: Soft, non-tender, non-distended MUSCULOSKELETAL:  No edema; No deformity  SKIN: Warm and dry NEUROLOGIC:  Alert and oriented x 3 PSYCHIATRIC:  Normal affect   Signed,  R , MD  05/10/2019 3:10 PM    [facility]  

## 2019-05-10 NOTE — Patient Instructions (Signed)

## 2019-05-16 ENCOUNTER — Encounter: Payer: Self-pay | Admitting: Family

## 2019-05-16 ENCOUNTER — Ambulatory Visit (INDEPENDENT_AMBULATORY_CARE_PROVIDER_SITE_OTHER): Payer: Medicare Other | Admitting: Family

## 2019-05-16 ENCOUNTER — Other Ambulatory Visit: Payer: Self-pay | Admitting: Internal Medicine

## 2019-05-16 DIAGNOSIS — G4700 Insomnia, unspecified: Secondary | ICD-10-CM | POA: Diagnosis not present

## 2019-05-16 MED ORDER — AMITRIPTYLINE HCL 25 MG PO TABS: 12.5000 mg | ORAL_TABLET | Freq: Every day | ORAL | 0 refills | Status: DC

## 2019-05-16 NOTE — Progress Notes (Signed)
Virtual Visit via Telephone Note  I connected with Mary Flynn on 05/16/19 at  5:40 PM EDT by telephone and verified that I am speaking with the correct person using two identifiers.  Location: Patient: home Provider: office   I discussed the limitations, risks, security and privacy concerns of performing an evaluation and management service by telephone and the availability of in person appointments. I also discussed with the patient that there may be a patient responsible charge related to this service. The patient expressed understanding and agreed to proceed.   History of Present Illness:  Patient is an 85 yr old female who presents today requesting rx for amitriptyline. She reports that her sister "who is an MD" gave her a week's supply of amitriptyline to help with her insomnia and this has really helped to sleep better and wake up refreshed. She is requesting to continue this medication.     Observations/Objective:  Not examined (virtual visit)  Assessment and Plan:  Insomnia- having good response to elavil.  Wishes to continue. Also is requesting that we send a 6 month supply as she will be traveling to [place].  I told her I would send this but that it might not be covered by her insurance.   She also is asking if we can provide her with covid-19 testing prior to her trip to [place]. I advised her that we are not testing in our office but she could have this done through [facility] or [facility].  She verbalizes understanding.  Follow Up Instructions:    I discussed the assessment and treatment plan with the patient. The patient was provided an opportunity to ask questions and all were answered. The patient agreed with the plan and demonstrated an understanding of the instructions.   The patient was advised to call back or seek an in-person evaluation if the symptoms worsen or if the condition fails to improve as anticipated.  I provided 11 minutes of non-face-to-face time  during this encounter.   Nance Pear, NP

## 2019-05-19 ENCOUNTER — Telehealth: Payer: Self-pay

## 2019-05-19 NOTE — Telephone Encounter (Addendum)
Patient called in to see if NP Inda Castle can send in a prescription for albuterol [facility] HFA) 108 (90 Base) MCG/ACT inhaler [id number]    And for niacin (NIASPAN) 500 MG CR tablet [id number]   cyclobenzaprine (FLEXERIL) 10 MG tablet [id number]   gabapentin (NEURONTIN) 300 MG capsule [id number]   montelukast (SINGULAIR) 10 MG tablet [id number]    timolol (BETIMOL) 0.5 % ophthalmic solution [id number]        Please send it to [facility] [place], [id number], [place] [address]  Phone:  [phone] Fax:  [phone]  DEA #:  --

## 2019-05-22 ENCOUNTER — Other Ambulatory Visit: Payer: Self-pay | Admitting: Internal Medicine

## 2019-05-22 DIAGNOSIS — H401123 Primary open-angle glaucoma, left eye, severe stage: Secondary | ICD-10-CM | POA: Diagnosis not present

## 2019-05-22 DIAGNOSIS — H16143 Punctate keratitis, bilateral: Secondary | ICD-10-CM | POA: Diagnosis not present

## 2019-05-22 DIAGNOSIS — H40011 Open angle with borderline findings, low risk, right eye: Secondary | ICD-10-CM | POA: Diagnosis not present

## 2019-05-22 DIAGNOSIS — H353131 Nonexudative age-related macular degeneration, bilateral, early dry stage: Secondary | ICD-10-CM | POA: Diagnosis not present

## 2019-05-22 DIAGNOSIS — H0102A Squamous blepharitis right eye, upper and lower eyelids: Secondary | ICD-10-CM | POA: Diagnosis not present

## 2019-05-24 ENCOUNTER — Other Ambulatory Visit: Payer: Self-pay

## 2019-05-24 ENCOUNTER — Encounter: Payer: Self-pay | Admitting: Family

## 2019-05-24 ENCOUNTER — Ambulatory Visit (INDEPENDENT_AMBULATORY_CARE_PROVIDER_SITE_OTHER): Payer: Medicare Other | Admitting: Family

## 2019-05-24 VITALS — BP 125/57 | HR 95 | Temp 97.4°F | Resp 16 | Wt 130.0 lb

## 2019-05-24 DIAGNOSIS — E039 Hypothyroidism, unspecified: Secondary | ICD-10-CM | POA: Diagnosis not present

## 2019-05-24 DIAGNOSIS — N3001 Acute cystitis with hematuria: Secondary | ICD-10-CM | POA: Diagnosis not present

## 2019-05-24 DIAGNOSIS — R300 Dysuria: Secondary | ICD-10-CM | POA: Diagnosis not present

## 2019-05-24 DIAGNOSIS — I10 Essential (primary) hypertension: Secondary | ICD-10-CM | POA: Diagnosis not present

## 2019-05-24 DIAGNOSIS — E1142 Type 2 diabetes mellitus with diabetic polyneuropathy: Secondary | ICD-10-CM

## 2019-05-24 DIAGNOSIS — E785 Hyperlipidemia, unspecified: Secondary | ICD-10-CM

## 2019-05-24 LAB — POC URINALSYSI DIPSTICK (AUTOMATED)
Bilirubin, UA: NEGATIVE
Glucose, UA: POSITIVE — AB
Nitrite, UA: NEGATIVE
Protein, UA: POSITIVE — AB
Spec Grav, UA: 1.01 (ref 1.010–1.025)
Urobilinogen, UA: NEGATIVE E.U./dL — AB
pH, UA: 6 (ref 5.0–8.0)

## 2019-05-24 MED ORDER — MONTELUKAST SODIUM 10 MG PO TABS: 10.0000 mg | ORAL_TABLET | Freq: Every day | ORAL | 1 refills | Status: DC

## 2019-05-24 MED ORDER — ALBUTEROL SULFATE HFA 108 (90 BASE) MCG/ACT IN AERS: 2.0000 | INHALATION_SPRAY | Freq: Four times a day (QID) | RESPIRATORY_TRACT | 2 refills | Status: DC | PRN

## 2019-05-24 MED ORDER — TIMOLOL HEMIHYDRATE 0.5 % OP SOLN: 1.0000 [drp] | Freq: Two times a day (BID) | OPHTHALMIC | 1 refills | Status: AC

## 2019-05-24 MED ORDER — CIPROFLOXACIN HCL 250 MG PO TABS: 250.0000 mg | ORAL_TABLET | Freq: Two times a day (BID) | ORAL | 0 refills | Status: DC

## 2019-05-24 MED ORDER — CYCLOBENZAPRINE HCL 10 MG PO TABS: ORAL_TABLET | ORAL | 1 refills | Status: DC

## 2019-05-24 MED ORDER — GABAPENTIN 300 MG PO CAPS: ORAL_CAPSULE | ORAL | 1 refills | Status: DC

## 2019-05-24 MED ORDER — NIACIN ER (ANTIHYPERLIPIDEMIC) 500 MG PO TBCR: 1000.0000 mg | EXTENDED_RELEASE_TABLET | Freq: Every day | ORAL | 1 refills | Status: DC

## 2019-05-24 MED FILL — CIPROFLOXACIN HCL 250 MG TA: 250 | 3 days supply | Qty: 6 | Fill #0

## 2019-05-24 NOTE — Telephone Encounter (Signed)
All refills sent in for patient today at ov with [facility]

## 2019-05-24 NOTE — Progress Notes (Signed)
Subjective:    Patient ID: Mary Flynn, female    DOB: [birth year], 85 y.o.   MRN: [id number]  HPI   Patient is an 85 yr old female who presents today for follow up. She hopes to travel to [place] for a few months.   Insomnia- last visit she requested an rx for elavil for insomnia.   DM2- following with Dr. Dwyane Dee (endo) Lab Results  Component Value Date   HGBA1C 7.4 (H) 03/15/2019   HGBA1C 7.6 (H) 12/08/2018   HGBA1C 8.0 (A) 07/11/2018   Lab Results  Component Value Date   MICROALBUR 3.8 (H) 07/08/2018   [place] 43 12/08/2018   CREATININE 0.73 03/15/2019    Hyperlipidemia- maintained on lipitor Lab Results  Component Value Date   CHOL 110 12/08/2018   HDL 49.20 12/08/2018   LDLCALC 43 12/08/2018   TRIG 88.0 12/08/2018   CHOLHDL 2 12/08/2018   HTN- maintained on toprol xl, amlodipine BP Readings from Last 3 Encounters:  05/10/19 124/70  03/27/19 130/80  03/10/19 113/66    Hypothyroid- maintained on synthroid.  Lab Results  Component Value Date   TSH 2.73 12/08/2018   Reports cloudy urine- reports mild dysuria, started 2 days ago.  Denies fever, back pain or blood in the urine.    Review of Systems See HPI  Past Medical History:  Diagnosis Date  . Diabetes mellitus   . Hyperlipidemia   . Hypertension   . Left rib fracture 02/18/2014     Social History   Socioeconomic History  . Marital status: Married    Spouse name: Kamarie Veno  . Number of children: 2  . Years of education: College  . Highest education level: Not on file  Occupational History  . Occupation: housewife  Tobacco Use  . Smoking status: Never Smoker  . Smokeless tobacco: Never Used  Substance and Sexual Activity  . Alcohol use: No  . Drug use: No  . Sexual activity: Not Currently  Other Topics Concern  . Not on file  Social History Narrative   Patient lives at home with spouse.   Caffeine Use: rarely   Social Determinants of Health   Financial Resource Strain: Low Risk     . Difficulty of Paying Living Expenses: Not hard at all  Food Insecurity:   . Worried About Charity fundraiser in the Last Year:   . Arboriculturist in the Last Year:   Transportation Needs: No Transportation Needs  . Lack of Transportation (Medical): No  . Lack of Transportation (Non-Medical): No  Physical Activity:   . Days of Exercise per Week:   . Minutes of Exercise per Session:   Stress:   . Feeling of Stress :   Social Connections:   . Frequency of Communication with Friends and Family:   . Frequency of Social Gatherings with Friends and Family:   . Attends Religious Services:   . Active Member of Clubs or Organizations:   . Attends Archivist Meetings:   Marland Kitchen Marital Status:   Intimate Partner Violence:   . Fear of Current or Ex-Partner:   . Emotionally Abused:   Marland Kitchen Physically Abused:   . Sexually Abused:     Past Surgical History:  Procedure Laterality Date  . APPENDECTOMY    . REFRACTIVE SURGERY     Left Eye  . surgical sterilization  40 years ago    Family History  Problem Relation Age of Onset  . Diabetes Mother   .  Hypertension Father   . Depression Other   . Cancer Other        laryngeal  . Diabetes Other     Allergies  Allergen Reactions  . Ace Inhibitors Cough  . Losartan Cough  . Penicillins Hives  . Sulfa Antibiotics Hives  . Sulfonamide Derivatives     Current Outpatient Medications on File Prior to Visit  Medication Sig Dispense Refill  . albuterol (PROAIR HFA) 108 (90 Base) MCG/ACT inhaler Inhale 2 puffs into the lungs every 6 (six) hours as needed for wheezing or shortness of breath. 18 g 2  . amitriptyline (ELAVIL) 25 MG tablet Take 0.5 tablets (12.5 mg total) by mouth at bedtime. 90 tablet 0  . amLODipine (NORVASC) 5 MG tablet Take 1 tablet (5 mg total) by mouth daily. 90 tablet 1  . aspirin EC 81 MG tablet Take 81 mg by mouth daily.    Marland Kitchen atorvastatin (LIPITOR) 10 MG tablet TAKE 1 TABLET BY MOUTH  DAILY 90 tablet 1  . b  complex vitamins tablet Take 1 tablet by mouth daily.    . Blood Glucose Monitoring Suppl (ONETOUCH VERIO FLEX SYSTEM) w/Device KIT 1 each by Does not apply route 2 (two) times daily. Use Onetouch verio flex to check blood sugar twice daily.    . cyclobenzaprine (FLEXERIL) 10 MG tablet TAKE 1 TABLET BY MOUTH TWO  TIMES DAILY AS NEEDED FOR  MUSCLE SPASM 180 tablet 0  . DULoxetine (CYMBALTA) 30 MG capsule TAKE 1 CAPSULE BY MOUTH  DAILY 90 capsule 1  . empagliflozin (JARDIANCE) 10 MG TABS tablet Take 10 mg by mouth daily. Replaces Invokana 30 tablet 3  . famotidine (PEPCID) 20 MG tablet TAKE 1 TABLET BY MOUTH AT  BEDTIME 90 tablet 3  . fluconazole (DIFLUCAN) 150 MG tablet Take one tablet by mouth as needed for yeast infection 3 tablet 0  . fluticasone (FLONASE) 50 MCG/ACT nasal spray Place 2 sprays into both nostrils daily. 16 g 6  . fluticasone (FLOVENT HFA) 110 MCG/ACT inhaler Inhale 2 puffs into the lungs 2 (two) times daily. 1 Inhaler 5  . gabapentin (NEURONTIN) 300 MG capsule TAKE 1 CAPSULE BY MOUTH 4  TIMES DAILY 360 capsule 0  . glimepiride (AMARYL) 4 MG tablet TAKE 1 TABLET BY MOUTH  TWICE DAILY 180 tablet 1  . glucose blood test strip Use OneTouch Verio Flex test strips as instructed to check blood sugar twice daily. 200 each 3  . Insulin Detemir (LEVEMIR FLEXPEN) 100 UNIT/ML Pen Inject 12 Units into the skin at bedtime.  15 mL 3  . insulin lispro (HUMALOG KWIKPEN) 100 UNIT/ML KiwkPen 8 units with breakfast and lunch, 4 units at evening meal    . Insulin Pen Needle 32G X 4 MM MISC Use 3 per day 100 each 3  . latanoprost (XALATAN) 0.005 % ophthalmic solution Place 1 drop into both eyes at bedtime.    Marland Kitchen levocetirizine (XYZAL) 5 MG tablet TAKE 1 TABLET BY MOUTH  EVERY EVENING 90 tablet 1  . levothyroxine (SYNTHROID) 25 MCG tablet TAKE 1 TABLET BY MOUTH  DAILY BEFORE BREAKFAST 90 tablet 3  . metFORMIN (GLUCOPHAGE) 1000 MG tablet TAKE 1 TABLET BY MOUTH  TWICE DAILY WITH A MEAL 180 tablet 1  .  metoprolol succinate (TOPROL-XL) 100 MG 24 hr tablet TAKE 1 TABLET BY MOUTH  DAILY WITH OR IMMEDIATELY  FOLLOWING A MEAL 90 tablet 1  . montelukast (SINGULAIR) 10 MG tablet TAKE 1 TABLET BY MOUTH AT  BEDTIME  90 tablet 1  . Multiple Vitamins-Minerals (HAIR SKIN AND NAILS FORMULA PO) Take by mouth daily.    . niacin (NIASPAN) 500 MG CR tablet Take 2 tablets (1,000 mg total) by mouth at bedtime. 90 tablet 1  . NONFORMULARY OR COMPOUNDED ITEM Lumbar support belt  #1  As directed 1 each 0  . nystatin ointment (MYCOSTATIN) APPLY TOPICALLY TWO TIMES  DAILY AS NEEDED 90 g 0  . OneTouch Delica Lancets 17O MISC 1 each by Does not apply route 2 (two) times daily. Use OneTouch Delica lancets to check blood sugar twice daily. 200 each 3  . pantoprazole (PROTONIX) 40 MG tablet TAKE 1 TABLET BY MOUTH  DAILY 90 tablet 1  . timolol (BETIMOL) 0.5 % ophthalmic solution Place 1 drop into both eyes 2 (two) times daily.    Marland Kitchen triamcinolone (KENALOG) 0.1 % paste Use as directed 1 application in the mouth or throat 2 (two) times daily. 15 g 1  . vitamin E 400 UNIT capsule Take 400 Units by mouth daily.    . [DISCONTINUED] atorvastatin (LIPITOR) 10 MG tablet TAKE 1 TABLET BY MOUTH  EVERY DAY 90 tablet 1  . [DISCONTINUED] DULoxetine (CYMBALTA) 30 MG capsule TAKE 1 CAPSULE BY MOUTH  DAILY 90 capsule 1  . [DISCONTINUED] glimepiride (AMARYL) 4 MG tablet TAKE 1 TABLET BY MOUTH  TWICE A DAY 180 tablet 1  . [DISCONTINUED] metFORMIN (GLUCOPHAGE) 1000 MG tablet TAKE 1 TABLET BY MOUTH TWO  TIMES DAILY WITH A MEAL 180 tablet 1  . [DISCONTINUED] metoprolol succinate (TOPROL-XL) 100 MG 24 hr tablet TAKE 1 TABLET BY MOUTH  DAILY WITH OR IMMEDIATELY  FOLLOWING A MEAL 90 tablet 1  . [DISCONTINUED] montelukast (SINGULAIR) 10 MG tablet TAKE 1 TABLET BY MOUTH AT  BEDTIME 90 tablet 1  . [DISCONTINUED] pantoprazole (PROTONIX) 40 MG tablet TAKE 1 TABLET BY MOUTH  DAILY 90 tablet 1   No current facility-administered medications on file prior to  visit.    There were no vitals taken for this visit.      Objective:   Physical Exam Constitutional:      Appearance: She is well-developed.  Neck:     Thyroid: No thyromegaly.  Cardiovascular:     Rate and Rhythm: Normal rate and regular rhythm.     Heart sounds: Normal heart sounds. No murmur.  Pulmonary:     Effort: Pulmonary effort is normal. No respiratory distress.     Breath sounds: Normal breath sounds. No wheezing.  Musculoskeletal:     Cervical back: Neck supple.  Skin:    General: Skin is warm and dry.  Neurological:     Mental Status: She is alert and oriented to person, place, and time.  Psychiatric:        Behavior: Behavior normal.        Thought Content: Thought content normal.        Judgment: Judgment normal.           Assessment & Plan:  HTN- bp stable on current regimen. Continue same.  DM2- management per endo.  Hyperlipidemia- LDL at goal, continue statin.  Hypothyroid- clinically stable on synthroid. Check follow up TSH.  UTI- UA suggests UTI- will rx with cipro. Send urine for culture to confirm.   This visit occurred during the SARS-CoV-2 public health emergency.  Safety protocols were in place, including screening questions prior to the visit, additional usage of staff PPE, and extensive cleaning of exam room while observing appropriate contact time as  indicated for disinfecting solutions.

## 2019-05-24 NOTE — Patient Instructions (Addendum)
Please complete lab work prior to leaving.  Begin Cipro for urinary tract infection. Call if symptoms worsen or if symptoms fail to improve.

## 2019-05-25 LAB — BASIC METABOLIC PANEL
BUN: 14 mg/dL (ref 6–23)
CO2: 26 mEq/L (ref 19–32)
Calcium: 9.9 mg/dL (ref 8.4–10.5)
Chloride: 98 mEq/L (ref 96–112)
Creatinine, Ser: 0.87 mg/dL (ref 0.40–1.20)
GFR: 62.24 mL/min (ref >60.00)
Glucose, Bld: 145 mg/dL — ABNORMAL HIGH (ref 70–99)
Potassium: 4.5 mEq/L (ref 3.5–5.1)
Sodium: 136 mEq/L (ref 135–145)

## 2019-05-25 LAB — TSH: TSH: 1.53 u[IU]/mL (ref 0.35–4.50)

## 2019-05-26 LAB — URINE CULTURE
MICRO NUMBER:: 10312870
SPECIMEN QUALITY:: ADEQUATE

## 2019-05-29 ENCOUNTER — Other Ambulatory Visit: Payer: Self-pay | Admitting: Family

## 2019-05-29 ENCOUNTER — Other Ambulatory Visit: Payer: Self-pay

## 2019-05-29 MED ORDER — NITROFURANTOIN MONOHYD MACRO 100 MG PO CAPS: 100.0000 mg | ORAL_CAPSULE | Freq: Two times a day (BID) | ORAL | 0 refills | Status: DC

## 2019-05-29 MED FILL — NITROFURANTOIN MONO-MCR 100: 100 | 5 days supply | Qty: 10 | Fill #0

## 2019-05-29 NOTE — Telephone Encounter (Signed)
Lab work shows that she has a UTI and the cipro is not going to cure it. D/c cipro, start macrobid.    Rx pended. Please send to her preferred pharmacy.

## 2019-05-30 ENCOUNTER — Emergency Department (HOSPITAL_COMMUNITY): Payer: Medicare Other

## 2019-05-30 ENCOUNTER — Encounter (HOSPITAL_COMMUNITY): Payer: Self-pay | Admitting: Emergency Medicine

## 2019-05-30 ENCOUNTER — Inpatient Hospital Stay (HOSPITAL_COMMUNITY)
Admission: EM | Admit: 2019-05-30 | Discharge: 2019-06-12 | DRG: 871 | Disposition: A | Discharge status: Home Health | Payer: Medicare Other | Attending: Internal Medicine | Admitting: Internal Medicine

## 2019-05-30 ENCOUNTER — Ambulatory Visit: Payer: Medicare Other | Admitting: Family Medicine

## 2019-05-30 DIAGNOSIS — E1165 Type 2 diabetes mellitus with hyperglycemia: Secondary | ICD-10-CM | POA: Diagnosis not present

## 2019-05-30 DIAGNOSIS — Z808 Family history of malignant neoplasm of other organs or systems: Secondary | ICD-10-CM

## 2019-05-30 DIAGNOSIS — M542 Cervicalgia: Secondary | ICD-10-CM | POA: Diagnosis present

## 2019-05-30 DIAGNOSIS — N3000 Acute cystitis without hematuria: Secondary | ICD-10-CM

## 2019-05-30 DIAGNOSIS — E039 Hypothyroidism, unspecified: Secondary | ICD-10-CM | POA: Diagnosis not present

## 2019-05-30 DIAGNOSIS — Z20822 Contact with and (suspected) exposure to covid-19: Secondary | ICD-10-CM | POA: Diagnosis present

## 2019-05-30 DIAGNOSIS — N179 Acute kidney failure, unspecified: Secondary | ICD-10-CM

## 2019-05-30 DIAGNOSIS — E876 Hypokalemia: Secondary | ICD-10-CM | POA: Diagnosis not present

## 2019-05-30 DIAGNOSIS — J810 Acute pulmonary edema: Secondary | ICD-10-CM | POA: Diagnosis not present

## 2019-05-30 DIAGNOSIS — Z1612 Extended spectrum beta lactamase (ESBL) resistance: Secondary | ICD-10-CM | POA: Diagnosis not present

## 2019-05-30 DIAGNOSIS — G4733 Obstructive sleep apnea (adult) (pediatric): Secondary | ICD-10-CM | POA: Diagnosis not present

## 2019-05-30 DIAGNOSIS — J9601 Acute respiratory failure with hypoxia: Secondary | ICD-10-CM

## 2019-05-30 DIAGNOSIS — F329 Major depressive disorder, single episode, unspecified: Secondary | ICD-10-CM | POA: Diagnosis present

## 2019-05-30 DIAGNOSIS — R7881 Bacteremia: Secondary | ICD-10-CM | POA: Diagnosis present

## 2019-05-30 DIAGNOSIS — B9620 Unspecified Escherichia coli [E. coli] as the cause of diseases classified elsewhere: Secondary | ICD-10-CM | POA: Diagnosis not present

## 2019-05-30 DIAGNOSIS — Z7982 Long term (current) use of aspirin: Secondary | ICD-10-CM

## 2019-05-30 DIAGNOSIS — E782 Mixed hyperlipidemia: Secondary | ICD-10-CM | POA: Diagnosis not present

## 2019-05-30 DIAGNOSIS — I110 Hypertensive heart disease with heart failure: Secondary | ICD-10-CM | POA: Diagnosis present

## 2019-05-30 DIAGNOSIS — Z882 Allergy status to sulfonamides status: Secondary | ICD-10-CM

## 2019-05-30 DIAGNOSIS — F5104 Psychophysiologic insomnia: Secondary | ICD-10-CM | POA: Diagnosis present

## 2019-05-30 DIAGNOSIS — Z880 Allergy status to penicillin: Secondary | ICD-10-CM

## 2019-05-30 DIAGNOSIS — K219 Gastro-esophageal reflux disease without esophagitis: Secondary | ICD-10-CM | POA: Diagnosis present

## 2019-05-30 DIAGNOSIS — E1151 Type 2 diabetes mellitus with diabetic peripheral angiopathy without gangrene: Secondary | ICD-10-CM | POA: Diagnosis not present

## 2019-05-30 DIAGNOSIS — R0902 Hypoxemia: Secondary | ICD-10-CM

## 2019-05-30 DIAGNOSIS — I248 Other forms of acute ischemic heart disease: Secondary | ICD-10-CM | POA: Diagnosis not present

## 2019-05-30 DIAGNOSIS — I361 Nonrheumatic tricuspid (valve) insufficiency: Secondary | ICD-10-CM | POA: Diagnosis not present

## 2019-05-30 DIAGNOSIS — E1142 Type 2 diabetes mellitus with diabetic polyneuropathy: Secondary | ICD-10-CM | POA: Diagnosis present

## 2019-05-30 DIAGNOSIS — I509 Heart failure, unspecified: Secondary | ICD-10-CM | POA: Diagnosis present

## 2019-05-30 DIAGNOSIS — J189 Pneumonia, unspecified organism: Secondary | ICD-10-CM | POA: Diagnosis not present

## 2019-05-30 DIAGNOSIS — A419 Sepsis, unspecified organism: Principal | ICD-10-CM

## 2019-05-30 DIAGNOSIS — Z818 Family history of other mental and behavioral disorders: Secondary | ICD-10-CM

## 2019-05-30 DIAGNOSIS — J90 Pleural effusion, not elsewhere classified: Secondary | ICD-10-CM | POA: Diagnosis not present

## 2019-05-30 DIAGNOSIS — Z7952 Long term (current) use of systemic steroids: Secondary | ICD-10-CM

## 2019-05-30 DIAGNOSIS — A4151 Sepsis due to Escherichia coli [E. coli]: Secondary | ICD-10-CM | POA: Diagnosis present

## 2019-05-30 DIAGNOSIS — R0602 Shortness of breath: Secondary | ICD-10-CM | POA: Diagnosis not present

## 2019-05-30 DIAGNOSIS — J811 Chronic pulmonary edema: Secondary | ICD-10-CM

## 2019-05-30 DIAGNOSIS — M810 Age-related osteoporosis without current pathological fracture: Secondary | ICD-10-CM | POA: Diagnosis present

## 2019-05-30 DIAGNOSIS — R0603 Acute respiratory distress: Secondary | ICD-10-CM | POA: Diagnosis not present

## 2019-05-30 DIAGNOSIS — I214 Non-ST elevation (NSTEMI) myocardial infarction: Secondary | ICD-10-CM

## 2019-05-30 DIAGNOSIS — Z79890 Hormone replacement therapy (postmenopausal): Secondary | ICD-10-CM

## 2019-05-30 DIAGNOSIS — J383 Other diseases of vocal cords: Secondary | ICD-10-CM | POA: Diagnosis not present

## 2019-05-30 DIAGNOSIS — Z79899 Other long term (current) drug therapy: Secondary | ICD-10-CM

## 2019-05-30 DIAGNOSIS — I272 Pulmonary hypertension, unspecified: Secondary | ICD-10-CM | POA: Diagnosis not present

## 2019-05-30 DIAGNOSIS — R52 Pain, unspecified: Secondary | ICD-10-CM | POA: Diagnosis not present

## 2019-05-30 DIAGNOSIS — Z8249 Family history of ischemic heart disease and other diseases of the circulatory system: Secondary | ICD-10-CM

## 2019-05-30 DIAGNOSIS — R0689 Other abnormalities of breathing: Secondary | ICD-10-CM | POA: Diagnosis not present

## 2019-05-30 DIAGNOSIS — R069 Unspecified abnormalities of breathing: Secondary | ICD-10-CM

## 2019-05-30 DIAGNOSIS — Z9049 Acquired absence of other specified parts of digestive tract: Secondary | ICD-10-CM

## 2019-05-30 DIAGNOSIS — Z888 Allergy status to other drugs, medicaments and biological substances status: Secondary | ICD-10-CM

## 2019-05-30 DIAGNOSIS — E1140 Type 2 diabetes mellitus with diabetic neuropathy, unspecified: Secondary | ICD-10-CM

## 2019-05-30 DIAGNOSIS — Z833 Family history of diabetes mellitus: Secondary | ICD-10-CM

## 2019-05-30 DIAGNOSIS — R6521 Severe sepsis with septic shock: Secondary | ICD-10-CM

## 2019-05-30 DIAGNOSIS — R4182 Altered mental status, unspecified: Secondary | ICD-10-CM | POA: Diagnosis present

## 2019-05-30 DIAGNOSIS — E118 Type 2 diabetes mellitus with unspecified complications: Secondary | ICD-10-CM | POA: Diagnosis not present

## 2019-05-30 DIAGNOSIS — A498 Other bacterial infections of unspecified site: Secondary | ICD-10-CM | POA: Diagnosis not present

## 2019-05-30 DIAGNOSIS — R6520 Severe sepsis without septic shock: Secondary | ICD-10-CM | POA: Diagnosis not present

## 2019-05-30 DIAGNOSIS — G894 Chronic pain syndrome: Secondary | ICD-10-CM | POA: Diagnosis present

## 2019-05-30 DIAGNOSIS — Z794 Long term (current) use of insulin: Secondary | ICD-10-CM

## 2019-05-30 DIAGNOSIS — Z743 Need for continuous supervision: Secondary | ICD-10-CM | POA: Diagnosis not present

## 2019-05-30 LAB — COMPREHENSIVE METABOLIC PANEL
ALT: 27 U/L (ref 0–44)
AST: 56 U/L — ABNORMAL HIGH (ref 15–41)
Albumin: 2.9 g/dL — ABNORMAL LOW (ref 3.5–5.0)
Alkaline Phosphatase: 173 U/L — ABNORMAL HIGH (ref 38–126)
Anion gap: 18 — ABNORMAL HIGH (ref 5–15)
BUN: 33 mg/dL — ABNORMAL HIGH (ref 8–23)
CO2: 14 mmol/L — ABNORMAL LOW (ref 22–32)
Calcium: 8.2 mg/dL — ABNORMAL LOW (ref 8.9–10.3)
Chloride: 100 mmol/L (ref 98–111)
Creatinine, Ser: 1.42 mg/dL — ABNORMAL HIGH (ref 0.44–1.00)
GFR calc Af Amer: 40 mL/min — ABNORMAL LOW (ref >60)
GFR calc non Af Amer: 34 mL/min — ABNORMAL LOW (ref >60)
Glucose, Bld: 269 mg/dL — ABNORMAL HIGH (ref 70–99)
Potassium: 3.5 mmol/L (ref 3.5–5.1)
Sodium: 132 mmol/L — ABNORMAL LOW (ref 135–145)
Total Bilirubin: 2 mg/dL — ABNORMAL HIGH (ref 0.3–1.2)
Total Protein: 6.2 g/dL — ABNORMAL LOW (ref 6.5–8.1)

## 2019-05-30 LAB — BLOOD GAS, VENOUS
Acid-base deficit: 8.2 mmol/L — ABNORMAL HIGH (ref 0.0–2.0)
Bicarbonate: 16.4 mmol/L — ABNORMAL LOW (ref 20.0–28.0)
Drawn by: 351621
O2 Saturation: 61.3 %
Patient temperature: 37
pCO2, Ven: 31.3 mmHg — ABNORMAL LOW (ref 44.0–60.0)
pH, Ven: 7.34 (ref 7.250–7.430)
pO2, Ven: 38.7 mmHg (ref 32.0–45.0)

## 2019-05-30 LAB — CBG MONITORING, ED: Glucose-Capillary: 236 mg/dL — ABNORMAL HIGH (ref 70–99)

## 2019-05-30 LAB — POCT I-STAT EG7
Acid-base deficit: 8 mmol/L — ABNORMAL HIGH (ref 0.0–2.0)
Bicarbonate: 12.9 mmol/L — ABNORMAL LOW (ref 20.0–28.0)
Calcium, Ion: 1.09 mmol/L — ABNORMAL LOW (ref 1.15–1.40)
HCT: 50 % — ABNORMAL HIGH (ref 36.0–46.0)
Hemoglobin: 17 g/dL — ABNORMAL HIGH (ref 12.0–15.0)
O2 Saturation: 78 %
Potassium: 3.2 mmol/L — ABNORMAL LOW (ref 3.5–5.1)
Sodium: 133 mmol/L — ABNORMAL LOW (ref 135–145)
TCO2: 13 mmol/L — ABNORMAL LOW (ref 22–32)
pCO2, Ven: 17.9 mmHg — CL (ref 44.0–60.0)
pH, Ven: 7.464 — ABNORMAL HIGH (ref 7.250–7.430)
pO2, Ven: 38 mmHg (ref 32.0–45.0)

## 2019-05-30 LAB — HEMOGLOBIN A1C
Hgb A1c MFr Bld: 8.2 % — ABNORMAL HIGH (ref 4.8–5.6)
Mean Plasma Glucose: 188.64 mg/dL

## 2019-05-30 LAB — LACTIC ACID, PLASMA
Lactic Acid, Venous: 3.7 mmol/L (ref 0.5–1.9)
Lactic Acid, Venous: 3.3 mmol/L (ref 0.5–1.9)

## 2019-05-30 LAB — URINALYSIS, ROUTINE W REFLEX MICROSCOPIC
Bilirubin Urine: NEGATIVE
Glucose, UA: >=500 mg/dL — AB
Ketones, ur: 20 mg/dL — AB
Nitrite: NEGATIVE
Protein, ur: 30 mg/dL — AB
Specific Gravity, Urine: 1.017 (ref 1.005–1.030)
pH: 5 (ref 5.0–8.0)

## 2019-05-30 LAB — CORTISOL: Cortisol, Plasma: 34.2 ug/dL

## 2019-05-30 LAB — I-STAT CHEM 8, ED
BUN: 32 mg/dL — ABNORMAL HIGH (ref 8–23)
Calcium, Ion: 1.1 mmol/L — ABNORMAL LOW (ref 1.15–1.40)
Chloride: 101 mmol/L (ref 98–111)
Creatinine, Ser: 1.3 mg/dL — ABNORMAL HIGH (ref 0.44–1.00)
Glucose, Bld: 263 mg/dL — ABNORMAL HIGH (ref 70–99)
HCT: 35 % — ABNORMAL LOW (ref 36.0–46.0)
Hemoglobin: 11.9 g/dL — ABNORMAL LOW (ref 12.0–15.0)
Potassium: 3.4 mmol/L — ABNORMAL LOW (ref 3.5–5.1)
Sodium: 133 mmol/L — ABNORMAL LOW (ref 135–145)
TCO2: 19 mmol/L — ABNORMAL LOW (ref 22–32)

## 2019-05-30 LAB — CBC WITH DIFFERENTIAL/PLATELET
Abs Immature Granulocytes: 0.04 10*3/uL (ref 0.00–0.07)
Basophils Absolute: 0 10*3/uL (ref 0.0–0.1)
Basophils Relative: 0 %
Eosinophils Absolute: 0 10*3/uL (ref 0.0–0.5)
Eosinophils Relative: 0 %
HCT: 35.8 % — ABNORMAL LOW (ref 36.0–46.0)
Hemoglobin: 11.2 g/dL — ABNORMAL LOW (ref 12.0–15.0)
Immature Granulocytes: 1 %
Lymphocytes Relative: 5 %
Lymphs Abs: 0.4 10*3/uL — ABNORMAL LOW (ref 0.7–4.0)
MCH: 23.5 pg — ABNORMAL LOW (ref 26.0–34.0)
MCHC: 31.3 g/dL (ref 30.0–36.0)
MCV: 75.2 fL — ABNORMAL LOW (ref 80.0–100.0)
Monocytes Absolute: 0.2 10*3/uL (ref 0.1–1.0)
Monocytes Relative: 2 %
Neutro Abs: 7.3 10*3/uL (ref 1.7–7.7)
Neutrophils Relative %: 92 %
Platelets: 215 10*3/uL (ref 150–400)
RBC: 4.76 MIL/uL (ref 3.87–5.11)
RDW: 19.6 % — ABNORMAL HIGH (ref 11.5–15.5)
WBC: 7.9 10*3/uL (ref 4.0–10.5)
nRBC: 0.5 % — ABNORMAL HIGH (ref 0.0–0.2)

## 2019-05-30 LAB — TROPONIN I (HIGH SENSITIVITY)
Troponin I (High Sensitivity): 1317 ng/L (ref <18)
Troponin I (High Sensitivity): 1018 ng/L (ref <18)
Troponin I (High Sensitivity): 785 ng/L (ref <18)
Troponin I (High Sensitivity): 616 ng/L (ref <18)

## 2019-05-30 LAB — ABO/RH: ABO/RH(D): B POS

## 2019-05-30 LAB — APTT: aPTT: 27 seconds (ref 24–36)

## 2019-05-30 LAB — GLUCOSE, CAPILLARY
Glucose-Capillary: 241 mg/dL — ABNORMAL HIGH (ref 70–99)
Glucose-Capillary: 220 mg/dL — ABNORMAL HIGH (ref 70–99)

## 2019-05-30 LAB — TYPE AND SCREEN
ABO/RH(D): B POS
Antibody Screen: NEGATIVE

## 2019-05-30 LAB — RESPIRATORY PANEL BY RT PCR (FLU A&B, COVID)
Influenza A by PCR: NEGATIVE
Influenza B by PCR: NEGATIVE
SARS Coronavirus 2 by RT PCR: NEGATIVE

## 2019-05-30 LAB — D-DIMER, QUANTITATIVE: D-Dimer, Quant: 6.5 ug/mL-FEU — ABNORMAL HIGH (ref 0.00–0.50)

## 2019-05-30 LAB — MRSA PCR SCREENING: MRSA by PCR: NEGATIVE

## 2019-05-30 LAB — METHEMOGLOBIN: Methemoglobin: 1.2 % (ref 0.0–1.5)

## 2019-05-30 LAB — PROTIME-INR
INR: 1.2 (ref 0.8–1.2)
Prothrombin Time: 15.1 seconds (ref 11.4–15.2)

## 2019-05-30 LAB — BRAIN NATRIURETIC PEPTIDE: B Natriuretic Peptide: 1053.2 pg/mL — ABNORMAL HIGH (ref 0.0–100.0)

## 2019-05-30 MED ORDER — FUROSEMIDE 10 MG/ML IJ SOLN
20.0000 mg | Freq: Once | INTRAMUSCULAR | Status: AC
  Administered 2019-05-30: 20 mg via INTRAVENOUS
  Filled 2019-05-30: qty 2

## 2019-05-30 MED ORDER — LACTATED RINGERS IV BOLUS (SEPSIS)
1000.0000 mL | Freq: Once | INTRAVENOUS | Status: AC
  Administered 2019-05-30: 12:00:00 1000 mL via INTRAVENOUS

## 2019-05-30 MED ORDER — BUDESONIDE 0.25 MG/2ML IN SUSP
0.2500 mg | Freq: Two times a day (BID) | RESPIRATORY_TRACT | Status: DC
  Administered 2019-05-31 – 2019-06-11 (×24): 0.25 mg via RESPIRATORY_TRACT
  Filled 2019-05-30 (×26): qty 2

## 2019-05-30 MED ORDER — LEVOFLOXACIN IN D5W 750 MG/150ML IV SOLN: 750.0000 mg | Freq: Once | INTRAVENOUS | Status: DC

## 2019-05-30 MED ORDER — SODIUM CHLORIDE 0.9 % IV SOLN
1.0000 g | Freq: Once | INTRAVENOUS | Status: AC
  Administered 2019-05-30: 10:00:00 1 g via INTRAVENOUS
  Filled 2019-05-30: qty 10

## 2019-05-30 MED ORDER — PANTOPRAZOLE SODIUM 40 MG PO TBEC
40.0000 mg | DELAYED_RELEASE_TABLET | Freq: Every day | ORAL | Status: DC
  Administered 2019-05-31 – 2019-06-06 (×7): 40 mg via ORAL
  Filled 2019-05-30 (×7): qty 1

## 2019-05-30 MED ORDER — SODIUM CHLORIDE 0.9% FLUSH
10.0000 mL | INTRAVENOUS | Status: DC | PRN
  Administered 2019-06-12: 3 mL

## 2019-05-30 MED ORDER — BISACODYL 10 MG RE SUPP: 10.0000 mg | Freq: Every day | RECTAL | Status: DC | PRN

## 2019-05-30 MED ORDER — CHLORHEXIDINE GLUCONATE CLOTH 2 % EX PADS
6.0000 | MEDICATED_PAD | Freq: Every day | CUTANEOUS | Status: DC
  Administered 2019-05-30 – 2019-06-06 (×8): 6 via TOPICAL

## 2019-05-30 MED ORDER — LACTATED RINGERS IV BOLUS (SEPSIS)
1000.0000 mL | Freq: Once | INTRAVENOUS | Status: AC
  Administered 2019-05-30: 1000 mL via INTRAVENOUS

## 2019-05-30 MED ORDER — INSULIN ASPART 100 UNIT/ML ~~LOC~~ SOLN
0.0000 [IU] | SUBCUTANEOUS | Status: DC
  Administered 2019-05-30 (×3): 5 [IU] via SUBCUTANEOUS
  Administered 2019-05-31: 2 [IU] via SUBCUTANEOUS
  Administered 2019-05-31: 3 [IU] via SUBCUTANEOUS
  Administered 2019-05-31: 2 [IU] via SUBCUTANEOUS
  Administered 2019-05-31: 3 [IU] via SUBCUTANEOUS
  Administered 2019-05-31 – 2019-06-01 (×2): 2 [IU] via SUBCUTANEOUS
  Administered 2019-06-01: 3 [IU] via SUBCUTANEOUS
  Administered 2019-06-01: 2 [IU] via SUBCUTANEOUS
  Administered 2019-06-02: 8 [IU] via SUBCUTANEOUS
  Administered 2019-06-02: 09:00:00 2 [IU] via SUBCUTANEOUS
  Administered 2019-06-02 (×2): 3 [IU] via SUBCUTANEOUS
  Administered 2019-06-02: 13:00:00 2 [IU] via SUBCUTANEOUS
  Administered 2019-06-03: 3 [IU] via SUBCUTANEOUS
  Administered 2019-06-03: 2 [IU] via SUBCUTANEOUS

## 2019-05-30 MED ORDER — ALBUTEROL (5 MG/ML) CONTINUOUS INHALATION SOLN
10.0000 mg/h | INHALATION_SOLUTION | RESPIRATORY_TRACT | Status: AC
  Administered 2019-05-30: 10 mg/h via RESPIRATORY_TRACT
  Filled 2019-05-30: qty 20

## 2019-05-30 MED ORDER — ORAL CARE MOUTH RINSE
15.0000 mL | Freq: Two times a day (BID) | OROMUCOSAL | Status: DC
  Administered 2019-05-30 – 2019-06-11 (×22): 15 mL via OROMUCOSAL

## 2019-05-30 MED ORDER — ACETAMINOPHEN 650 MG RE SUPP
650.0000 mg | Freq: Once | RECTAL | Status: AC
  Administered 2019-05-30: 650 mg via RECTAL

## 2019-05-30 MED ORDER — LEVOTHYROXINE SODIUM 25 MCG PO TABS
25.0000 ug | ORAL_TABLET | Freq: Every day | ORAL | Status: DC
  Administered 2019-06-01 – 2019-06-12 (×12): 25 ug via ORAL
  Filled 2019-05-30 (×13): qty 1

## 2019-05-30 MED ORDER — NOREPINEPHRINE 4 MG/250ML-% IV SOLN
0.0000 ug/min | INTRAVENOUS | Status: DC
  Administered 2019-05-30: 11:00:00 10 ug/min via INTRAVENOUS
  Filled 2019-05-30: qty 250

## 2019-05-30 MED ORDER — SODIUM CHLORIDE 0.9 % IV SOLN
500.0000 mg | Freq: Once | INTRAVENOUS | Status: AC
  Administered 2019-05-30: 10:00:00 500 mg via INTRAVENOUS
  Filled 2019-05-30: qty 500

## 2019-05-30 MED ORDER — INSULIN DETEMIR 100 UNIT/ML ~~LOC~~ SOLN
12.0000 [IU] | Freq: Every day | SUBCUTANEOUS | Status: DC
  Administered 2019-05-30 – 2019-06-08 (×10): 12 [IU] via SUBCUTANEOUS
  Filled 2019-05-30 (×12): qty 0.12

## 2019-05-30 MED ORDER — SODIUM CHLORIDE 0.9 % IV SOLN
1.0000 g | Freq: Two times a day (BID) | INTRAVENOUS | Status: DC
  Administered 2019-05-31 – 2019-06-03 (×9): 1 g via INTRAVENOUS
  Filled 2019-05-30 (×12): qty 1

## 2019-05-30 MED ORDER — SODIUM CHLORIDE 0.9% FLUSH
10.0000 mL | Freq: Two times a day (BID) | INTRAVENOUS | Status: DC
  Administered 2019-05-30 – 2019-06-08 (×17): 10 mL
  Administered 2019-06-09: 3 mL
  Administered 2019-06-09 – 2019-06-11 (×4): 10 mL

## 2019-05-30 MED ORDER — ALBUTEROL SULFATE HFA 108 (90 BASE) MCG/ACT IN AERS
2.0000 | INHALATION_SPRAY | RESPIRATORY_TRACT | Status: AC
  Administered 2019-05-30: 2 via RESPIRATORY_TRACT
  Filled 2019-05-30: qty 6.7

## 2019-05-30 MED ORDER — HEPARIN SODIUM (PORCINE) 5000 UNIT/ML IJ SOLN
5000.0000 [IU] | Freq: Three times a day (TID) | INTRAMUSCULAR | Status: DC
  Administered 2019-05-30 – 2019-06-12 (×38): 5000 [IU] via SUBCUTANEOUS
  Filled 2019-05-30 (×38): qty 1

## 2019-05-30 MED ORDER — SODIUM CHLORIDE 0.9 % IV SOLN
1.0000 g | INTRAVENOUS | Status: AC
  Administered 2019-05-30: 1 g via INTRAVENOUS
  Filled 2019-05-30: qty 1

## 2019-05-30 MED ORDER — DOCUSATE SODIUM 100 MG PO CAPS
100.0000 mg | ORAL_CAPSULE | Freq: Every day | ORAL | Status: DC
  Administered 2019-05-31 – 2019-06-04 (×5): 100 mg via ORAL
  Filled 2019-05-30 (×5): qty 1

## 2019-05-30 MED ORDER — NOREPINEPHRINE 4 MG/250ML-% IV SOLN
5.0000 ug/min | INTRAVENOUS | Status: DC
  Administered 2019-05-30: 16:00:00 15 ug/min via INTRAVENOUS
  Administered 2019-05-30: 10 ug/min via INTRAVENOUS
  Filled 2019-05-30 (×3): qty 250

## 2019-05-30 MED ORDER — IOHEXOL 350 MG/ML SOLN
75.0000 mL | Freq: Once | INTRAVENOUS | Status: AC | PRN
  Administered 2019-05-30: 75 mL via INTRAVENOUS

## 2019-05-30 NOTE — Progress Notes (Signed)
Patient admitted with evidence of sepsis (elevated lactic acid with bacteria and WBCs on UA) and  and CHF (BNP greater than 1000).  While tachycardic in the 130s and RR 20-30 on 5 liters she is maintaining sats of 97%, fully awake, conversant and interactive.  Will continue to monitor in ICU setting with consideration for early intubation if necessary.

## 2019-05-30 NOTE — H&P (Signed)
NAME:  Mary Flynn, MRN:  [id number], DOB:  May 01, 1934, LOS: 0 ADMISSION DATE:  05/30/2019, CONSULTATION DATE: 4/6 REFERRING MD: Dr. Sabra Heck EDP, CHIEF COMPLAINT: Septic shock, urosepsis  Brief History   85 year old female diagnosed with urinary tract infection approximately 1 week prior to arrival that went untreated due to miscommunication.  Patient now presenting in septic shock.  History of present illness   85 year old female with past medical history as below, which is significant for diabetes mellitus, hypertension, and hyperlipidemia.  She presented to PCP office on 3/31 with complaints of cloudy urine and mild dysuria for 2 days.  She was started on ciprofloxacin and urine was sent for culture.  It seems as though there was misunderstanding and she felt as though she was not supposed to take the ciprofloxacin.  Urine culture showed ESBL E. coli and she was switched to [place], however, at that point she was feeling too ill to have the prescription filled and ultimately presented to [facility] emergency department on 4/6 by EMS with complaints of altered mental status.  In the emergency department she was treated as a code sepsis with concern for worsening urinary tract infection and possibly a pulmonary process.  She was found to be febrile and hypotensive requiring IV fluid resuscitation and ultimately Levophed infusion.  Central line was placed in the emergency department.  CT angiogram of the chest was negative for pulmonary embolism, however, it did demonstrate some bibasilar atelectasis versus infiltrate.  PCCM was asked to admit to ICU.  Past Medical History   has a past medical history of Diabetes mellitus, Hyperlipidemia, Hypertension, and Left rib fracture (02/18/2014).   Significant Hospital Events   4/6 admit for septic shock to ICU on Levophed infusion  Consults:    Procedures:  Right femoral central line 4/6 >  Significant Diagnostic Tests:  CT angiogram chest 4/6 > no  evidence of PE, bibasilar infiltrates record in the left.  Stable mediastinal lymph node. Renal ultrasound 4/6 >  Micro Data:  Urine 3/31 > ESBL E. Coli, sensitive to imipenem Blood 4/6 > Urine 4/6 > just under 3 weeks away  Antimicrobials:  Meropenem 4/6 >  Interim history/subjective:  Feeling much better at this time.  Breathing better on BiPAP.  Abdominal pain improved.  Objective   Blood pressure 102/79, pulse (!) 114, temperature (!) 103.9 F (39.9 C), temperature source Rectal, resp. rate (!) 22, height 4' 10" (1.473 m), weight 59 kg, SpO2 100 %.    Vent Mode: BIPAP;PCV FiO2 (%):  [60 %] 60 % Set Rate:  [16 bmp] 16 bmp PEEP:  [5 cmH20] 5 cmH20  No intake or output data in the 24 hours ending 05/30/19 1258 Filed Weights   05/30/19 0950  Weight: 59 kg    Examination: General: Elderly female with normal body habitus in no distress on BiPAP HENT: Normocephalic, atraumatic, PERRL, no appreciable JVD Lungs: Bibasilar rhonchi.  Unlabored on BiPAP.  Speaking full sentences Cardiovascular: Tachycardic, regular, no murmurs rubs gallops Abdomen: Soft, nondistended, nontender Extremities: No acute deformity or range of motion limitation Neuro: Alert, oriented, nonfocal  Resolved Hospital Problem list     Assessment & Plan:   Septic shock: ESBL E. coli urinary tract infection. - Admit to ICU - Norepinephrine infusion for MAP goal 65 mmHg - Status post 30 cc/kg IVF resuscitation. - Meropenem per pharmacy dosing - Follow cultures - Consider repeat echocardiogram - Renal US  Acute hypoxemic respiratory failure: likely acute lung injury in the setting  of sepsis. Alternatively CT findings could represent atelectasis or pneumonia.  - BiPAP PRN, likely ready to come off upon arrival to ICU - SpO2 goal > 90% - CXR in AM  AKI Lactic acidosis - Hydrate - Follow BMP - will hold off on K supplementation.  - trend lactic  Troponin elevation: up to 1300 on admission,  suspect related to demand ischemia. No obvious evidence of ischemia on EKG - Trend troponin - Defer anticoagulation for now.   DM: hyperglycemic - CBG monitoring and SSI - continue home levemir - Holding home Jardiance, glimepiride, metformin  Hypertension history - holding home metoprolol, amlodipine  Hypothyroid - continue home synthroid  Best practice:  Diet: NPO Pain/Anxiety/Delirium protocol (if indicated): NA VAP protocol (if indicated): NA DVT prophylaxis: SQH GI prophylaxis: NA Glucose control: CBG monitoring and SSI Mobility: BR Code Status: FULL Family Communication: Dr Elsworth Soho to update family.  Disposition: ICU  Labs   CBC: Recent Labs  Lab 05/30/19 0943 05/30/19 1005 05/30/19 1100  WBC 7.9  --   --   NEUTROABS 7.3  --   --   HGB 11.2* 17.0* 11.9*  HCT 35.8* 50.0* 35.0*  MCV 75.2*  --   --   PLT 215  --   --     Basic Metabolic Panel: Recent Labs  Lab 05/24/19 1331 05/30/19 0943 05/30/19 1005 05/30/19 1100  NA 136 132* 133* 133*  K 4.5 3.5 3.2* 3.4*  CL 98 100  --  101  CO2 26 14*  --   --   GLUCOSE 145* 269*  --  263*  BUN 14 33*  --  32*  CREATININE 0.87 1.42*  --  1.30*  CALCIUM 9.9 8.2*  --   --    GFR: Estimated Creatinine Clearance: 25.3 mL/min (A) (by C-G formula based on SCr of 1.3 mg/dL (H)). Recent Labs  Lab 05/30/19 0943 05/30/19 1216  WBC 7.9  --   LATICACIDVEN 3.7* 3.3*    Liver Function Tests: Recent Labs  Lab 05/30/19 0943  AST 56*  ALT 27  ALKPHOS 173*  BILITOT 2.0*  PROT 6.2*  ALBUMIN 2.9*   No results for input(s): LIPASE, AMYLASE in the last 168 hours. No results for input(s): AMMONIA in the last 168 hours.  ABG    Component Value Date/Time   HCO3 12.9 (L) 05/30/2019 1005   TCO2 19 (L) 05/30/2019 1100   ACIDBASEDEF 8.0 (H) 05/30/2019 1005   O2SAT 78.0 05/30/2019 1005     Coagulation Profile: Recent Labs  Lab 05/30/19 0943  INR 1.2    Cardiac Enzymes: No results for input(s): CKTOTAL, CKMB,  CKMBINDEX, TROPONINI in the last 168 hours.  HbA1C: Hgb A1c MFr Bld  Date/Time Value Ref Range Status  03/15/2019 08:24 AM 7.4 (H) 4.6 - 6.5 % Final    Comment:    Glycemic Control Guidelines for People with Diabetes:Non Diabetic:  <6%Goal of Therapy: <7%Additional Action Suggested:  >8%   12/08/2018 07:53 AM 7.6 (H) 4.6 - 6.5 % Final    Comment:    Glycemic Control Guidelines for People with Diabetes:Non Diabetic:  <6%Goal of Therapy: <7%Additional Action Suggested:  >8%     CBG: No results for input(s): GLUCAP in the last 168 hours.  Review of Systems:   Limited by BiPAP Bolds are positive  Constitutional: weight loss, gain, night sweats, Fevers, chills, fatigue .  HEENT: headaches, Sore throat, sneezing, nasal congestion, post nasal drip, Difficulty swallowing, Tooth/dental problems, visual complaints visual changes, ear  ache CV:  chest pain, radiates:,Orthopnea, PND, swelling in lower extremities, dizziness, palpitations, syncope.  GI  heartburn, indigestion, abdominal pain improved, nausea, vomiting, diarrhea, change in bowel habits, loss of appetite, bloody stools.   Resp: cough, productive:, hemoptysis, dyspnea, chest pain, pleuritic.  Skin: rash or itching or icterus GU: dysuria, change in color of urine, urgency or frequency. flank pain, hematuria  MS: joint pain or swelling. decreased range of motion  Psych: change in mood or affect. depression or anxiety.  Neuro: difficulty with speech, weakness, numbness, ataxia    Past Medical History  She,  has a past medical history of Diabetes mellitus, Hyperlipidemia, Hypertension, and Left rib fracture (02/18/2014).   Surgical History    Past Surgical History:  Procedure Laterality Date  . APPENDECTOMY    . REFRACTIVE SURGERY     Left Eye  . surgical sterilization  40 years ago     Social History   reports that she has never smoked. She has never used smokeless tobacco. She reports that she does not drink alcohol or use  drugs.   Family History   Her family history includes Cancer in an other family member; Depression in an other family member; Diabetes in her mother and another family member; Hypertension in her father.   Allergies Allergies  Allergen Reactions  . Ace Inhibitors Cough  . Losartan Cough  . Penicillins Hives  . Sulfa Antibiotics Hives  . Sulfonamide Derivatives      Home Medications  Prior to Admission medications   Medication Sig Start Date End Date Taking? Authorizing Provider  albuterol (PROAIR HFA) 108 (90 Base) MCG/ACT inhaler Inhale 2 puffs into the lungs every 6 (six) hours as needed for wheezing or shortness of breath. 05/24/19   [name], NP  amitriptyline (ELAVIL) 25 MG tablet Take 0.5 tablets (12.5 mg total) by mouth at bedtime. 05/16/19   [name], NP  amLODipine (NORVASC) 5 MG tablet Take 1 tablet (5 mg total) by mouth daily. 10/21/18   [name], NP  aspirin EC 81 MG tablet Take 81 mg by mouth daily.    [provider]  atorvastatin (LIPITOR) 10 MG tablet TAKE 1 TABLET BY MOUTH  DAILY 01/22/19   [name], NP  b complex vitamins tablet Take 1 tablet by mouth daily.    [provider]  Blood Glucose Monitoring Suppl (Plain View) w/Device KIT 1 each by Does not apply route 2 (two) times daily. Use Onetouch verio flex to check blood sugar twice daily.    [provider]  ciprofloxacin (CIPRO) 250 MG tablet Take 1 tablet (250 mg total) by mouth 2 (two) times daily. 05/24/19   [name], NP  cyclobenzaprine (FLEXERIL) 10 MG tablet TAKE 1 TABLET BY MOUTH TWO  TIMES DAILY AS NEEDED FOR  MUSCLE SPASM 05/24/19   [name], NP  DULoxetine (CYMBALTA) 30 MG capsule TAKE 1 CAPSULE BY MOUTH  DAILY 01/22/19   [name], NP  empagliflozin (JARDIANCE) 10 MG TABS tablet Take 10 mg by mouth daily. Replaces Invokana 04/24/19   Elayne Snare, MD  famotidine (PEPCID) 20 MG tablet TAKE 1  TABLET BY MOUTH AT  BEDTIME 01/23/19   Tanda Rockers, MD  fluconazole (DIFLUCAN) 150 MG tablet Take one tablet by mouth as needed for yeast infection 01/10/19   [name], NP  fluticasone (FLONASE) 50 MCG/ACT nasal spray Place 2 sprays into both nostrils daily. 11/25/17   [name], NP  fluticasone (FLOVENT HFA) 110 MCG/ACT  inhaler Inhale 2 puffs into the lungs 2 (two) times daily. 08/03/18   [name], NP  gabapentin (NEURONTIN) 300 MG capsule TAKE 1 CAPSULE BY MOUTH 4  TIMES DAILY 05/24/19   [name], NP  glimepiride (AMARYL) 4 MG tablet TAKE 1 TABLET BY MOUTH  TWICE DAILY 01/22/19   [name], NP  glucose blood test strip Use OneTouch Verio Flex test strips as instructed to check blood sugar twice daily. 02/14/19   Elayne Snare, MD  Insulin Detemir (LEVEMIR FLEXPEN) 100 UNIT/ML Pen Inject 12 Units into the skin at bedtime.  12/23/15   Ann Held, DO  insulin lispro (HUMALOG KWIKPEN) 100 UNIT/ML KiwkPen 8 units with breakfast and lunch, 4 units at evening meal 12/23/15   Carollee Herter, Alferd Apa, DO  Insulin Pen Needle 32G X 4 MM MISC Use 3 per day 01/22/15   Elayne Snare, MD  latanoprost (XALATAN) 0.005 % ophthalmic solution Place 1 drop into both eyes at bedtime. 09/28/16   [provider]  levocetirizine (XYZAL) 5 MG tablet TAKE 1 TABLET BY MOUTH  EVERY EVENING 08/19/18   [name], NP  levothyroxine (SYNTHROID) 25 MCG tablet TAKE 1 TABLET BY MOUTH  DAILY BEFORE BREAKFAST 10/17/18   [name], NP  metFORMIN (GLUCOPHAGE) 1000 MG tablet TAKE 1 TABLET BY MOUTH  TWICE DAILY WITH A MEAL 01/22/19   [name], NP  metoprolol succinate (TOPROL-XL) 100 MG 24 hr tablet TAKE 1 TABLET BY MOUTH  DAILY WITH OR IMMEDIATELY  FOLLOWING A MEAL 01/22/19   [name], NP  montelukast (SINGULAIR) 10 MG tablet Take 1 tablet (10 mg total) by mouth at bedtime. 05/24/19   [name], NP  Multiple Vitamins-Minerals  (HAIR SKIN AND NAILS FORMULA PO) Take by mouth daily.    [provider]  niacin (NIASPAN) 500 MG CR tablet Take 2 tablets (1,000 mg total) by mouth at bedtime. 05/24/19   [name], NP  nitrofurantoin, macrocrystal-monohydrate, (MACROBID) 100 MG capsule Take 1 capsule (100 mg total) by mouth 2 (two) times daily. 05/29/19   [name], NP  NONFORMULARY OR COMPOUNDED ITEM Lumbar support belt  #1  As directed 12/19/15   Carollee Herter, Kendrick Fries R, DO  nystatin ointment (MYCOSTATIN) APPLY TOPICALLY TWO TIMES  DAILY AS NEEDED 08/19/18   [name], NP  OneTouch Delica Lancets 67Y MISC 1 each by Does not apply route 2 (two) times daily. Use OneTouch Delica lancets to check blood sugar twice daily. 12/19/18   Elayne Snare, MD  pantoprazole (PROTONIX) 40 MG tablet TAKE 1 TABLET BY MOUTH  DAILY 01/22/19   [name], NP  timolol (BETIMOL) 0.5 % ophthalmic solution Place 1 drop into both eyes 2 (two) times daily. 05/24/19   [name], NP  triamcinolone (KENALOG) 0.1 % paste Use as directed 1 application in the mouth or throat 2 (two) times daily. 07/28/18   [name], NP  vitamin E 400 UNIT capsule Take 400 Units by mouth daily.    [provider]  atorvastatin (LIPITOR) 10 MG tablet TAKE 1 TABLET BY MOUTH  EVERY DAY 08/19/18   [name], NP  DULoxetine (CYMBALTA) 30 MG capsule TAKE 1 CAPSULE BY MOUTH  DAILY 08/19/18   [name], NP  glimepiride (AMARYL) 4 MG tablet TAKE 1 TABLET BY MOUTH  TWICE A DAY 08/19/18   [name], NP  metFORMIN (GLUCOPHAGE) 1000 MG tablet TAKE 1 TABLET BY MOUTH TWO  TIMES DAILY WITH A MEAL 08/19/18   [name], NP  metoprolol succinate (TOPROL-XL) 100 MG 24 hr tablet TAKE 1 TABLET BY MOUTH  DAILY WITH OR IMMEDIATELY  FOLLOWING A MEAL 08/19/18   [name], NP  montelukast (SINGULAIR) 10 MG tablet TAKE 1 TABLET BY MOUTH AT  BEDTIME 08/19/18   [name], NP  pantoprazole  (PROTONIX) 40 MG tablet TAKE 1 TABLET BY MOUTH  DAILY 08/19/18   [name], NP     Critical care time: 50 mins     Georgann Housekeeper, AGACNP-BC [place] for personal pager PCCM on call pager [phone]  05/30/2019 1:33 PM

## 2019-05-30 NOTE — Progress Notes (Signed)
Pt taken off bipap and placed on 6L HFNC per MD request. Pt stable throughout with no complications. Vs within normal limits. Will wean O2 as tolerated

## 2019-05-30 NOTE — Progress Notes (Signed)
Pharmacy Antibiotic Note  Mary Flynn is a 85 y.o. female admitted on 05/30/2019 with sepsis and UTI.  Pharmacy has been consulted for meropenem dosing. Pt is febrile with Tmax 103.9 and WBC is WNL. SCr is elevated and lactic acid is elevated. Recent ESBL EColi UTI.   Plan: Meropenem 1gm IV Q12H F/u renal fxn, C&S, clinical status  Height: 4\' 10"  (147.3 cm) Weight: 59 kg (130 lb) IBW/kg (Calculated) : 40.9  Temp (24hrs), Avg:103.9 F (39.9 C), Min:103.9 F (39.9 C), Max:103.9 F (39.9 C)  Recent Labs  Lab 05/24/19 1331 05/30/19 0943 05/30/19 1100  WBC  --  7.9  --   CREATININE 0.87 1.42* 1.30*  LATICACIDVEN  --  3.7*  --     Estimated Creatinine Clearance: 25.3 mL/min (A) (by C-G formula based on SCr of 1.3 mg/dL (H)).    Allergies  Allergen Reactions  . Ace Inhibitors Cough  . Losartan Cough  . Penicillins Hives  . Sulfa Antibiotics Hives  . Sulfonamide Derivatives     Antimicrobials this admission: Mero 4/6>> Azithro x 1 4/6 CTX x 1 4/6  Dose adjustments this admission: N/A  Microbiology results: 3/31 Urine - ESBL EColi  Thank you for allowing pharmacy to be a part of this patient's care.  Dyasia Firestine, Rande Lawman 05/30/2019 12:53 PM

## 2019-05-30 NOTE — ED Notes (Signed)
Daughter Allegra Grana would like an update [phone]

## 2019-05-30 NOTE — Progress Notes (Signed)
Notified bedside nurse of need to administer fluid bolus as well as a repeat lactic

## 2019-05-30 NOTE — Progress Notes (Signed)
[facility] Progress Note Patient Name: Mary Flynn DOB: 08-22-1934 MRN: [id number]   Date of Service  05/30/2019  HPI/Events of Note  Sinus Tachycardia - HR = 133. Paatient is on BiPAP with Sat = 90-92% and RR = 30-40. I suspect that the sinus tachycaria may be related to increased WOB. Intubation?  eICU Interventions  Will aske the ground team to evaluate the patient at bedside.      Intervention Category Major Interventions: Arrhythmia - evaluation and management  Dayquan Buys [place] 05/30/2019, 8:08 PM

## 2019-05-30 NOTE — ED Triage Notes (Signed)
Pt here from home with c/o aloc , pt is being treated for an UTI , pt arrives confused and on a NRB , cbg 301, 500 NS by ems , temp 99.5

## 2019-05-30 NOTE — ED Provider Notes (Signed)
[facility] EMERGENCY DEPARTMENT Provider Note   CSN: [id number] Arrival date & time: 05/30/19  1191     History No chief complaint on file.   Mary Flynn is a 85 y.o. female.  HPI   This is a very ill-appearing 85 year old female, she is known to be a diabetic, according to the medical history this patient was recently seen and treated for acute cystitis through the family medicine office, had UTI, treated with ciprofloxacin.  According to the paramedics who transported the patient to the hospital she was found with a change in her mental status this morning when someone was talking to her on the phone.  This was a neighbor, when the paramedics arrived they found the patient to be hypoxic, tachycardic, ill-appearing slightly confused and had a temperature over 99.9, the activated a code sepsis prehospital.  No antibiotics were given, oxygen was supplied by nonrebreather.  Further review of the medical record shows that the patient had a change in her medication to nitrofurantoin yesterday.  The patient does not have any history of pulmonary embolism, however she is a diabetic taking multiple different diet medications including Jardiance and Metformin  Past Medical History:  Diagnosis Date  . Diabetes mellitus   . Hyperlipidemia   . Hypertension   . Left rib fracture 02/18/2014    Patient Active Problem List   Diagnosis Date Noted  . Mixed dyslipidemia 05/10/2019  . DOE (dyspnea on exertion) 08/08/2018  . Allergic rhinitis 12/14/2017  . Subclinical hypothyroidism 06/11/2016  . Mid back pain on left side 01/28/2016  . Pre-ulcerative corn or callous 03/13/2015  . DM type 2 with diabetic peripheral neuropathy ([place]) 10/16/2014  . Left rib fracture 02/18/2014  . Skin infection 09/28/2013  . Otitis, externa, infective 09/28/2013  . Chronic cough 01/11/2013  . Tinea cruris 07/27/2012  . Urinary frequency 07/27/2012  . Eczema 03/08/2012  . Headache(784.0)  03/08/2012  . Arm pain, right 08/24/2011  . Depression 07/16/2011  . Compression fracture of L2 lumbar vertebra ([place]) 06/08/2011  . Chronic insomnia 04/26/2011  . PPD positive 04/17/2011  . URINARY INCONTINENCE, STRESS, FEMALE 04/29/2010  . MEMORY LOSS 12/23/2009  . Chronic bronchitis NEC 12/23/2009  . DIABETIC PERIPHERAL NEUROPATHY 06/28/2009  . Acute low back pain due to trauma 12/20/2008  . FIBROIDS, UTERUS 12/18/2008  . ABDOMINAL BLOATING 12/17/2008  . Constipation 01/24/2008  . GERD 01/23/2008  . SPRAIN&STRAIN OTHER SPECIFIED SITES KNEE&LEG 07/20/2007  . UTI 05/16/2007  . LACERATION OF FINGER 10/12/2006  . THYROID NODULE, RIGHT 08/05/2006  . DM (diabetes mellitus) type II uncontrolled, periph vascular disorder ([place]) 08/05/2006  . HYPERLIPIDEMIA 08/05/2006  . Essential hypertension 08/05/2006  . OSTEOPOROSIS NOS 08/05/2006    Past Surgical History:  Procedure Laterality Date  . APPENDECTOMY    . REFRACTIVE SURGERY     Left Eye  . surgical sterilization  40 years ago     OB History   No obstetric history on file.     Family History  Problem Relation Age of Onset  . Diabetes Mother   . Hypertension Father   . Depression Other   . Cancer Other        laryngeal  . Diabetes Other     Social History   Tobacco Use  . Smoking status: Never Smoker  . Smokeless tobacco: Never Used  Substance Use Topics  . Alcohol use: No  . Drug use: No    Home Medications Prior to Admission medications   Medication  Sig Start Date End Date Taking? Authorizing Provider  albuterol (PROAIR HFA) 108 (90 Base) MCG/ACT inhaler Inhale 2 puffs into the lungs every 6 (six) hours as needed for wheezing or shortness of breath. 05/24/19   [name], NP  amitriptyline (ELAVIL) 25 MG tablet Take 0.5 tablets (12.5 mg total) by mouth at bedtime. 05/16/19   [name], NP  amLODipine (NORVASC) 5 MG tablet Take 1 tablet (5 mg total) by mouth daily. 10/21/18   [name], NP  aspirin EC 81 MG tablet Take 81 mg by mouth daily.    [provider]  atorvastatin (LIPITOR) 10 MG tablet TAKE 1 TABLET BY MOUTH  DAILY 01/22/19   [name], NP  b complex vitamins tablet Take 1 tablet by mouth daily.    [provider]  Blood Glucose Monitoring Suppl ([place]) w/Device KIT 1 each by Does not apply route 2 (two) times daily. Use Onetouch verio flex to check blood sugar twice daily.    [provider]  ciprofloxacin (CIPRO) 250 MG tablet Take 1 tablet (250 mg total) by mouth 2 (two) times daily. 05/24/19   [name], NP  cyclobenzaprine (FLEXERIL) 10 MG tablet TAKE 1 TABLET BY MOUTH TWO  TIMES DAILY AS NEEDED FOR  MUSCLE SPASM 05/24/19   [name], NP  DULoxetine (CYMBALTA) 30 MG capsule TAKE 1 CAPSULE BY MOUTH  DAILY 01/22/19   [name], NP  empagliflozin (JARDIANCE) 10 MG TABS tablet Take 10 mg by mouth daily. Replaces Invokana 04/24/19   Elayne Snare, MD  famotidine (PEPCID) 20 MG tablet TAKE 1 TABLET BY MOUTH AT  BEDTIME 01/23/19   Tanda Rockers, MD  fluconazole (DIFLUCAN) 150 MG tablet Take one tablet by mouth as needed for yeast infection 01/10/19   [name], NP  fluticasone (FLONASE) 50 MCG/ACT nasal spray Place 2 sprays into both nostrils daily. 11/25/17   [name], NP  fluticasone (FLOVENT HFA) 110 MCG/ACT inhaler Inhale 2 puffs into the lungs 2 (two) times daily. 08/03/18   [name], NP  gabapentin (NEURONTIN) 300 MG capsule TAKE 1 CAPSULE BY MOUTH 4  TIMES DAILY 05/24/19   [name], NP  glimepiride (AMARYL) 4 MG tablet TAKE 1 TABLET BY MOUTH  TWICE DAILY 01/22/19   [name], NP  glucose blood test strip Use OneTouch Verio Flex test strips as instructed to check blood sugar twice daily. 02/14/19   Elayne Snare, MD  Insulin Detemir (LEVEMIR FLEXPEN) 100 UNIT/ML Pen Inject 12 Units into the skin at bedtime.  12/23/15   Ann Held, DO  insulin lispro (HUMALOG KWIKPEN) 100 UNIT/ML KiwkPen 8 units with breakfast and lunch, 4 units at evening meal 12/23/15   Carollee Herter, Alferd Apa, DO  Insulin Pen Needle 32G X 4 MM MISC Use 3 per day 01/22/15   Elayne Snare, MD  latanoprost (XALATAN) 0.005 % ophthalmic solution Place 1 drop into both eyes at bedtime. 09/28/16   [provider]  levocetirizine (XYZAL) 5 MG tablet TAKE 1 TABLET BY MOUTH  EVERY EVENING 08/19/18   [name], NP  levothyroxine (SYNTHROID) 25 MCG tablet TAKE 1 TABLET BY MOUTH  DAILY BEFORE BREAKFAST 10/17/18   [name], NP  metFORMIN (GLUCOPHAGE) 1000 MG tablet TAKE 1 TABLET BY MOUTH  TWICE DAILY WITH A MEAL 01/22/19   [name], NP  metoprolol succinate (TOPROL-XL) 100 MG 24 hr tablet TAKE 1 TABLET BY MOUTH  DAILY WITH OR IMMEDIATELY  FOLLOWING A MEAL  01/22/19   [name], NP  montelukast (SINGULAIR) 10 MG tablet Take 1 tablet (10 mg total) by mouth at bedtime. 05/24/19   [name], NP  Multiple Vitamins-Minerals (HAIR SKIN AND NAILS FORMULA PO) Take by mouth daily.    [provider]  niacin (NIASPAN) 500 MG CR tablet Take 2 tablets (1,000 mg total) by mouth at bedtime. 05/24/19   [name], NP  nitrofurantoin, macrocrystal-monohydrate, (MACROBID) 100 MG capsule Take 1 capsule (100 mg total) by mouth 2 (two) times daily. 05/29/19   [name], NP  NONFORMULARY OR COMPOUNDED ITEM Lumbar support belt  #1  As directed 12/19/15   Carollee Herter, Kendrick Fries R, DO  nystatin ointment (MYCOSTATIN) APPLY TOPICALLY TWO TIMES  DAILY AS NEEDED 08/19/18   [name], NP  OneTouch Delica Lancets 08M MISC 1 each by Does not apply route 2 (two) times daily. Use OneTouch Delica lancets to check blood sugar twice daily. 12/19/18   Elayne Snare, MD  pantoprazole (PROTONIX) 40 MG tablet TAKE 1 TABLET BY MOUTH  DAILY 01/22/19   [name], NP  timolol (BETIMOL) 0.5 % ophthalmic solution  Place 1 drop into both eyes 2 (two) times daily. 05/24/19   [name], NP  triamcinolone (KENALOG) 0.1 % paste Use as directed 1 application in the mouth or throat 2 (two) times daily. 07/28/18   [name], NP  vitamin E 400 UNIT capsule Take 400 Units by mouth daily.    [provider]  atorvastatin (LIPITOR) 10 MG tablet TAKE 1 TABLET BY MOUTH  EVERY DAY 08/19/18   [name], NP  DULoxetine (CYMBALTA) 30 MG capsule TAKE 1 CAPSULE BY MOUTH  DAILY 08/19/18   [name], NP  glimepiride (AMARYL) 4 MG tablet TAKE 1 TABLET BY MOUTH  TWICE A DAY 08/19/18   [name], NP  metFORMIN (GLUCOPHAGE) 1000 MG tablet TAKE 1 TABLET BY MOUTH TWO  TIMES DAILY WITH A MEAL 08/19/18   [name], NP  metoprolol succinate (TOPROL-XL) 100 MG 24 hr tablet TAKE 1 TABLET BY MOUTH  DAILY WITH OR IMMEDIATELY  FOLLOWING A MEAL 08/19/18   [name], NP  montelukast (SINGULAIR) 10 MG tablet TAKE 1 TABLET BY MOUTH AT  BEDTIME 08/19/18   [name], NP  pantoprazole (PROTONIX) 40 MG tablet TAKE 1 TABLET BY MOUTH  DAILY 08/19/18   [name], NP    Allergies    Ace inhibitors, Losartan, Penicillins, Sulfa antibiotics, and Sulfonamide derivatives  Review of Systems   Review of Systems  Unable to perform ROS: Mental status change    Physical Exam Updated Vital Signs SpO2 (!) 66%   Physical Exam Constitutional:      General: She is in acute distress.     Appearance: She is ill-appearing.     Comments: Ill-appearing, generally weak, very tachypneic  HENT:     Head:     Comments: Normocephalic and atraumatic    Nose: Nose normal.     Mouth/Throat:     Mouth: Mucous membranes are dry.     Comments: No malocclusion, no trismus, dry mucous membranes Eyes:     Extraocular Movements: Extraocular movements intact.     Pupils: Pupils are equal, round, and reactive to light.  Cardiovascular:     Rate and Rhythm: Regular rhythm.  Tachycardia present.     Comments: Regular tachycardia with palpable pulses at radial arteries and no edema Pulmonary:     Breath sounds: Rales present.     Comments: Acute respiratory  distress with tachypnea, accessory muscle use, speaks in very short and sentences at 2-3 words, no wheezing but has some slight rales at the bases Abdominal:     Comments: Distended abdomen, very soft and nontender, mild tympanic sounds to percussion  Musculoskeletal:     Right lower leg: No edema.     Left lower leg: No edema.  Skin:    General: Skin is warm.     Findings: No rash.     Comments: No rash to the skin  Neurological:     Comments: The patient is able to speak in shortened sentences, much of what she is saying is confused and disoriented but she is able to answer some questions in a normal fashion     ED Results / Procedures / Treatments   Labs (all labs ordered are listed, but only abnormal results are displayed) Labs Reviewed  LACTIC ACID, PLASMA - Abnormal; Notable for the following components:      Result Value   Lactic Acid, Venous 3.7 (*)    All other components within normal limits  COMPREHENSIVE METABOLIC PANEL - Abnormal; Notable for the following components:   Sodium 132 (*)    CO2 14 (*)    Glucose, Bld 269 (*)    BUN 33 (*)    Creatinine, Ser 1.42 (*)    Calcium 8.2 (*)    Total Protein 6.2 (*)    Albumin 2.9 (*)    AST 56 (*)    Alkaline Phosphatase 173 (*)    Total Bilirubin 2.0 (*)    GFR calc non Af Amer 34 (*)    GFR calc Af Amer 40 (*)    Anion gap 18 (*)    All other components within normal limits  CBC WITH DIFFERENTIAL/PLATELET - Abnormal; Notable for the following components:   Hemoglobin 11.2 (*)    HCT 35.8 (*)    MCV 75.2 (*)    MCH 23.5 (*)    RDW 19.6 (*)    nRBC 0.5 (*)    Lymphs Abs 0.4 (*)    All other components within normal limits  URINALYSIS, ROUTINE W REFLEX MICROSCOPIC - Abnormal; Notable for the following components:   APPearance  HAZY (*)    Glucose, UA >=500 (*)    Hgb urine dipstick SMALL (*)    Ketones, ur 20 (*)    Protein, ur 30 (*)    Leukocytes,Ua TRACE (*)    Bacteria, UA MANY (*)    All other components within normal limits  D-DIMER, QUANTITATIVE (NOT AT [facility]) - Abnormal; Notable for the following components:   D-Dimer, Quant 6.50 (*)    All other components within normal limits  POCT I-STAT EG7 - Abnormal; Notable for the following components:   pH, Ven 7.464 (*)    pCO2, Ven 17.9 (*)    Bicarbonate 12.9 (*)    TCO2 13 (*)    Acid-base deficit 8.0 (*)    Sodium 133 (*)    Potassium 3.2 (*)    Calcium, Ion 1.09 (*)    HCT 50.0 (*)    Hemoglobin 17.0 (*)    All other components within normal limits  I-STAT CHEM 8, ED - Abnormal; Notable for the following components:   Sodium 133 (*)    Potassium 3.4 (*)    BUN 32 (*)    Creatinine, Ser 1.30 (*)    Glucose, Bld 263 (*)    Calcium, Ion 1.10 (*)    TCO2 19 (*)  Hemoglobin 11.9 (*)    HCT 35.0 (*)    All other components within normal limits  TROPONIN I (HIGH SENSITIVITY) - Abnormal; Notable for the following components:   Troponin I (High Sensitivity) 1,317 (*)    All other components within normal limits  RESPIRATORY PANEL BY RT PCR (FLU A&B, COVID)  CULTURE, BLOOD (ROUTINE X 2)  URINE CULTURE  APTT  PROTIME-INR  LACTIC ACID, PLASMA  METHEMOGLOBIN  BLOOD GAS, VENOUS  BRAIN NATRIURETIC PEPTIDE  TROPONIN I (HIGH SENSITIVITY)    EKG EKG Interpretation  Date/Time:  Tuesday May 30 2019 09:27:54 EDT Ventricular Rate:  131 PR Interval:    QRS Duration: 74 QT Interval:  305 QTC Calculation: 449 R Axis:   135 Text Interpretation: Sinus tachycardia Right axis deviation Low voltage, extremity leads Minimal ST depression, inferior leads Since last tracing axis is deviated right Confirmed by Noemi Chapel [phone]) on 05/30/2019 9:34:54 AM   Radiology DG Chest Port 1 View  Result Date: 05/30/2019 CLINICAL DATA:  Short of breath and  altered mental status. EXAM: PORTABLE CHEST 1 VIEW COMPARISON:  08/08/2018 FINDINGS: Normal heart size. Aortic atherosclerosis. No pleural effusion. Platelike atelectasis is identified within bilateral mid lung zones. No airspace consolidation. IMPRESSION: Bilateral mid lung zone atelectasis. Electronically Signed   By: Kerby Moors M.D.   On: 05/30/2019 09:55    Procedures .Critical Care Performed by: Noemi Chapel, MD Authorized by: Noemi Chapel, MD   Critical care provider statement:    Critical care time (minutes):  80   Critical care time was exclusive of:  Separately billable procedures and treating other patients and teaching time   Critical care was necessary to treat or prevent imminent or life-threatening deterioration of the following conditions:  Sepsis and respiratory failure   Critical care was time spent personally by me on the following activities:  Blood draw for specimens, development of treatment plan with patient or surrogate, discussions with consultants, evaluation of patient's response to treatment, examination of patient, obtaining history from patient or surrogate, ordering and performing treatments and interventions, ordering and review of laboratory studies, ordering and review of radiographic studies, pulse oximetry, re-evaluation of patient's condition and review of old charts Comments:       .Central Line  Date/Time: 05/30/2019 11:08 AM Performed by: Noemi Chapel, MD Authorized by: Noemi Chapel, MD   Consent:    Consent obtained:  Verbal   Consent given by:  Patient and spouse   Risks discussed:  Arterial puncture, incorrect placement, bleeding, infection and nerve damage   Alternatives discussed:  Delayed treatment and alternative treatment Pre-procedure details:    Hand hygiene: Hand hygiene performed prior to insertion     Sterile barrier technique: All elements of maximal sterile technique followed     Skin preparation:  ChloraPrep   Skin  preparation agent: Skin preparation agent completely dried prior to procedure   Anesthesia (see MAR for exact dosages):    Anesthesia method:  Local infiltration   Local anesthetic:  Lidocaine 1% w/o epi Procedure details:    Location:  R femoral   Patient position:  Flat   Procedural supplies:  Triple lumen   Catheter size:  7 Fr   Landmarks identified: yes     Ultrasound guidance: yes     Sterile ultrasound techniques: Sterile gel and sterile probe covers were used     Number of attempts:  2   Successful placement: no   Post-procedure details:    Post-procedure:  Dressing applied  and line sutured   Assessment:  Blood return through all ports   Patient tolerance of procedure:  Tolerated well, no immediate complications Comments:     There was an arterial puncture prior to placing in the vein, pressure held, minimal hematoma    (including critical care time)  Medications Ordered in ED Medications  lactated ringers bolus 1,000 mL (0 mLs Intravenous Stopped 05/30/19 1133)    And  lactated ringers bolus 1,000 mL (1,000 mLs Intravenous New Bag/Given 05/30/19 1150)  norepinephrine (LEVOPHED) '4mg'$  in 234m premix infusion (15 mcg/min Intravenous Rate/Dose Change 05/30/19 1150)  albuterol (PROVENTIL,VENTOLIN) solution continuous neb (10 mg/hr Nebulization New Bag/Given 05/30/19 1141)  albuterol (VENTOLIN HFA) 108 (90 Base) MCG/ACT inhaler 2 puff (2 puffs Inhalation Given 05/30/19 1019)  cefTRIAXone (ROCEPHIN) 1 g in sodium chloride 0.9 % 100 mL IVPB (0 g Intravenous Stopped 05/30/19 1108)  azithromycin (ZITHROMAX) 500 mg in sodium chloride 0.9 % 250 mL IVPB (0 mg Intravenous Stopped 05/30/19 1026)  acetaminophen (TYLENOL) suppository 650 mg (650 mg Rectal Given 05/30/19 1026)    ED Course  I have reviewed the triage vital signs and the nursing notes.  Pertinent labs & imaging results that were available during my care of the patient were reviewed by me and considered in my medical decision making  (see chart for details).    MDM Rules/Calculators/A&P                      This patient is ill-appearing, I suspect that she has an acute pulmonary process whether this is infectious, this could be related to an underlying pulmonary embolism or infection, she has been on multiple medications this week including ciprofloxacin and nitrofurantoin.  Would consider possible methemoglobinemia however she is at risk for worsening condition such as sepsis, COVID-19, she definitely has acute respiratory failure with an oxygen of 66% on room air.    The husband arrived and states that the patient was never started on Cipro, when the culture report came back they were contacted yesterday by the family doctor and started on MFunk  He does not have much else to add towards why the patient is actually here today.  He felt like she was confused and having trouble breathing  Central line placed in the R groin due to hypotension - needing vasopressors.  See note attached - will send for CT due to tachyardia and hypoxia - r/o PE / pneuomonia - Covid neg.  Cr 1.3.  Lactic acid 3.7, no leukocytosis.  The patient is now on Levophed, blood pressure slightly improved, she is minimally responsive to IV fluids and has had 30 cc/kg, current heart rate is 115 with a blood pressure of 89/58 on Levophed.  Rectal Tylenol given, she is on BiPAP and seeming to do better, mental status is much more clear, is able to talk, oxygen in the low 97% range   This patient presents to the ED for concern of shortness of breath and altered mental status, this involves an extensive number of treatment options, and is a complaint that carries with it a high risk of complications and morbidity.  The differential diagnosis includes COVID-19, respiratory failure secondary to sepsis, pulmonary embolism, acute cardiac disease, congestive heart failure, pneumothorax, DKA  The clinical course of this patient was complicated in that she had  episodes of hypotension with a low map, she was given 30 cc/kg of IV fluids, antibiotics to cover for community-acquired pneumonia, due to hypoxia she was  on a nonrebreather which brought her up to 89%, renal function pending, may need CT angiogram to rule out pulmonary embolism.  She definitely appears unstable, full code   Lab Tests:   I Ordered, reviewed, and interpreted labs, which included sepsis protocol, urinalysis with culture, COVID-19 PCR  Medicines ordered:   I ordered medication albuterol, IV fluids, broad-spectrum antibiotics to cover for community-acquired pneumonia   Imaging Studies ordered:   I ordered imaging studies which included chest x-ray and CT scan of the chest with contrast looking for pulmonary embolism  I independently visualized and interpreted imaging which showed according to my interpretation that the patient has multifocal infiltrates in the bilateral lower lobes, radiology has read this as mid zone atelectasis.  CT scan shows multiple bilateral lower lobe pulmonary infiltrates, no obvious pulmonary embolism seen   Additional history obtained:   Additional history obtained from family and electronic medical record  Previous records obtained and reviewed see notes above  Consultations Obtained:   I consulted with the patient's sister who is an infectious disease physician in [place], consulted with the critical intensivist and will discuss with cardiology though I think that the troponin is likely secondary to the patient's demand ischemia from sepsis and shock and discussed lab and imaging findings  Reevaluation:  After the interventions stated above, I reevaluated the patient and found improved with BiPAP, Levophed and antibiotics as well as IV fluids  D/w Dr. Elsworth Soho, who will admit to ICU D/w Cardiolgy - agrees that pt is likely having demand ischemia - cards evaluation once she stabilizes  Critical Interventions:  . Broad-spectrum  antibiotics, IV fluid resuscitation, vasopressor, BiPAP   Final Clinical Impression(s) / ED Diagnoses Final diagnoses:  Septic shock ([place])  Community acquired pneumonia, unspecified laterality  Acute cystitis without hematuria  NSTEMI (non-ST elevated myocardial infarction) ([place])      Noemi Chapel, MD 05/30/19 1237

## 2019-05-31 ENCOUNTER — Inpatient Hospital Stay (HOSPITAL_COMMUNITY): Payer: Medicare Other

## 2019-05-31 DIAGNOSIS — I361 Nonrheumatic tricuspid (valve) insufficiency: Secondary | ICD-10-CM

## 2019-05-31 DIAGNOSIS — R6521 Severe sepsis with septic shock: Secondary | ICD-10-CM | POA: Diagnosis not present

## 2019-05-31 DIAGNOSIS — Z1612 Extended spectrum beta lactamase (ESBL) resistance: Secondary | ICD-10-CM

## 2019-05-31 DIAGNOSIS — A419 Sepsis, unspecified organism: Secondary | ICD-10-CM | POA: Diagnosis not present

## 2019-05-31 DIAGNOSIS — A498 Other bacterial infections of unspecified site: Secondary | ICD-10-CM | POA: Diagnosis not present

## 2019-05-31 LAB — BLOOD CULTURE ID PANEL (REFLEXED)

## 2019-05-31 LAB — CBC
HCT: 30.5 % — ABNORMAL LOW (ref 36.0–46.0)
Hemoglobin: 9.7 g/dL — ABNORMAL LOW (ref 12.0–15.0)
MCH: 23.4 pg — ABNORMAL LOW (ref 26.0–34.0)
MCHC: 31.8 g/dL (ref 30.0–36.0)
MCV: 73.5 fL — ABNORMAL LOW (ref 80.0–100.0)
Platelets: 226 10*3/uL (ref 150–400)
RBC: 4.15 MIL/uL (ref 3.87–5.11)
RDW: 19.7 % — ABNORMAL HIGH (ref 11.5–15.5)
WBC: 21.7 10*3/uL — ABNORMAL HIGH (ref 4.0–10.5)
nRBC: 0.2 % (ref 0.0–0.2)

## 2019-05-31 LAB — PHOSPHORUS: Phosphorus: 2.5 mg/dL (ref 2.5–4.6)

## 2019-05-31 LAB — BASIC METABOLIC PANEL
Anion gap: 14 (ref 5–15)
BUN: 22 mg/dL (ref 8–23)
CO2: 21 mmol/L — ABNORMAL LOW (ref 22–32)
Calcium: 8.1 mg/dL — ABNORMAL LOW (ref 8.9–10.3)
Chloride: 106 mmol/L (ref 98–111)
Creatinine, Ser: 1.13 mg/dL — ABNORMAL HIGH (ref 0.44–1.00)
GFR calc Af Amer: 52 mL/min — ABNORMAL LOW (ref >60)
GFR calc non Af Amer: 45 mL/min — ABNORMAL LOW (ref >60)
Glucose, Bld: 147 mg/dL — ABNORMAL HIGH (ref 70–99)
Potassium: 3.2 mmol/L — ABNORMAL LOW (ref 3.5–5.1)
Sodium: 141 mmol/L (ref 135–145)

## 2019-05-31 LAB — GLUCOSE, CAPILLARY
Glucose-Capillary: 100 mg/dL — ABNORMAL HIGH (ref 70–99)
Glucose-Capillary: 106 mg/dL — ABNORMAL HIGH (ref 70–99)
Glucose-Capillary: 121 mg/dL — ABNORMAL HIGH (ref 70–99)
Glucose-Capillary: 128 mg/dL — ABNORMAL HIGH (ref 70–99)
Glucose-Capillary: 147 mg/dL — ABNORMAL HIGH (ref 70–99)
Glucose-Capillary: 161 mg/dL — ABNORMAL HIGH (ref 70–99)
Glucose-Capillary: 190 mg/dL — ABNORMAL HIGH (ref 70–99)

## 2019-05-31 LAB — ECHOCARDIOGRAM LIMITED
Height: 58 in
Weight: 2169.33 oz

## 2019-05-31 LAB — MAGNESIUM: Magnesium: 1.6 mg/dL — ABNORMAL LOW (ref 1.7–2.4)

## 2019-05-31 MED ORDER — MAGNESIUM SULFATE 2 GM/50ML IV SOLN
2.0000 g | Freq: Once | INTRAVENOUS | Status: AC
  Administered 2019-05-31: 2 g via INTRAVENOUS
  Filled 2019-05-31: qty 50

## 2019-05-31 MED ORDER — POTASSIUM CHLORIDE 10 MEQ/100ML IV SOLN: 10.0000 meq | INTRAVENOUS | Status: DC

## 2019-05-31 MED ORDER — TIMOLOL MALEATE 0.5 % OP SOLN
1.0000 [drp] | Freq: Every day | OPHTHALMIC | Status: DC
  Administered 2019-05-31 – 2019-06-12 (×13): 1 [drp] via OPHTHALMIC
  Filled 2019-05-31: qty 5

## 2019-05-31 MED ORDER — LATANOPROST 0.005 % OP SOLN
1.0000 [drp] | Freq: Every day | OPHTHALMIC | Status: DC
  Administered 2019-05-31 – 2019-06-11 (×12): 1 [drp] via OPHTHALMIC
  Filled 2019-05-31: qty 2.5

## 2019-05-31 MED ORDER — SODIUM CHLORIDE 0.9 % IV SOLN
INTRAVENOUS | Status: DC | PRN
  Administered 2019-05-31: 02:00:00 250 mL via INTRAVENOUS

## 2019-05-31 MED ORDER — POTASSIUM CHLORIDE 10 MEQ/50ML IV SOLN
10.0000 meq | INTRAVENOUS | Status: AC
  Administered 2019-05-31 (×4): 10 meq via INTRAVENOUS
  Filled 2019-05-31 (×4): qty 50

## 2019-05-31 MED ORDER — ACETAMINOPHEN 325 MG PO TABS
650.0000 mg | ORAL_TABLET | Freq: Four times a day (QID) | ORAL | Status: DC | PRN
  Administered 2019-06-01 – 2019-06-07 (×3): 650 mg via ORAL
  Filled 2019-05-31 (×4): qty 2

## 2019-05-31 MED ORDER — MELATONIN 3 MG PO TABS
3.0000 mg | ORAL_TABLET | Freq: Every evening | ORAL | Status: DC | PRN
  Administered 2019-06-01 (×2): 3 mg via ORAL
  Filled 2019-05-31 (×3): qty 1

## 2019-05-31 NOTE — Progress Notes (Signed)
PHARMACY - PHYSICIAN COMMUNICATION CRITICAL VALUE ALERT - BLOOD CULTURE IDENTIFICATION (BCID)  Mary Flynn is an 85 y.o. female who presented to [facility] on 05/30/2019 with a chief complaint of septic shock  Assessment:  Pt with Ecoli (no resistance detected) bacteremia (Urine suspected source). Noted pt with h/o ESBL Ecoli and in ICU.  Name of physician (or Provider) Contacted: Oletta Darter  Current antibiotics: Meropenem  Changes to prescribed antibiotics recommended:  Patient is on recommended antibiotics - No changes needed  Results for orders placed or performed during the hospital encounter of 05/30/19  Blood Culture ID Panel (Reflexed) (Collected: 05/30/2019 10:00 AM)  Result Value Ref Range   Enterococcus species NOT DETECTED NOT DETECTED   Listeria monocytogenes NOT DETECTED NOT DETECTED   Staphylococcus species NOT DETECTED NOT DETECTED   Staphylococcus aureus (BCID) NOT DETECTED NOT DETECTED   Streptococcus species NOT DETECTED NOT DETECTED   Streptococcus agalactiae NOT DETECTED NOT DETECTED   Streptococcus pneumoniae NOT DETECTED NOT DETECTED   Streptococcus pyogenes NOT DETECTED NOT DETECTED   Acinetobacter baumannii NOT DETECTED NOT DETECTED   Enterobacteriaceae species DETECTED (A) NOT DETECTED   Enterobacter cloacae complex NOT DETECTED NOT DETECTED   Escherichia coli DETECTED (A) NOT DETECTED   Klebsiella oxytoca NOT DETECTED NOT DETECTED   Klebsiella pneumoniae NOT DETECTED NOT DETECTED   Proteus species NOT DETECTED NOT DETECTED   Serratia marcescens NOT DETECTED NOT DETECTED   Carbapenem resistance NOT DETECTED NOT DETECTED   Haemophilus influenzae NOT DETECTED NOT DETECTED   Neisseria meningitidis NOT DETECTED NOT DETECTED   Pseudomonas aeruginosa NOT DETECTED NOT DETECTED   Candida albicans NOT DETECTED NOT DETECTED   Candida glabrata NOT DETECTED NOT DETECTED   Candida krusei NOT DETECTED NOT DETECTED   Candida parapsilosis NOT DETECTED NOT DETECTED   Candida  tropicalis NOT DETECTED NOT DETECTED    Sherlon Handing, PharmD, BCPS Please see amion for complete clinical pharmacist phone list 05/31/2019  1:57 AM

## 2019-05-31 NOTE — Care Management (Signed)
CM contacted by beside nurse requesting note for pt's daughter incase she needs proof that her mom is in [facility] hospital.  Pt's daughter Mary Flynn plans to travel to [place] from [place] early am to visit mom while hospitalized.   CM completed note and printed to nurse station.  Pt's bedside nurse Georgina Snell will send pts daughter letter.

## 2019-05-31 NOTE — Progress Notes (Signed)
[place] Progress Note Patient Name: Mary Flynn DOB: 1934/12/11 MRN: [id number]   Date of Service  05/31/2019  HPI/Events of Note  Patient requests sleep aid.   eICU Interventions  Nursing to ask pharmacy to enter a verbal order for Melatonin 3 mg PO Q HS PRN sleep.     Intervention Category Major Interventions: Other:  Lysle Dingwall 05/31/2019, 11:36 PM

## 2019-05-31 NOTE — Progress Notes (Addendum)
NAME:  Mary Flynn, MRN:  [id number], DOB:  1935/02/15, LOS: 1 ADMISSION DATE:  05/30/2019, CONSULTATION DATE:  05/30/2019 REFERRING MD:  EDP CHIEF COMPLAINT:  AMS  Brief History   85 year old female diagnosed with urinary tract infection approximately 1 week prior to arrival that went untreated due to miscommunication.  Patient now presenting in septic shock.  History of present illness   85 year old female with past medical history as below, which is significant for diabetes mellitus, hypertension, and hyperlipidemia.  She presented to PCP office on 3/31 with complaints of cloudy urine and mild dysuria for 2 days.  She was started on ciprofloxacin and urine was sent for culture.  It seems as though there was misunderstanding and she felt as though she was not supposed to take the ciprofloxacin.  Urine culture showed ESBL E. coli and she was switched to [place], however, at that point she was feeling too ill to have the prescription filled and ultimately presented to [facility] emergency department on 4/6 by EMS with complaints of altered mental status.  In the emergency department she was treated as a code sepsis with concern for worsening urinary tract infection and possibly a pulmonary process.  She was found to be febrile and hypotensive requiring IV fluid resuscitation and ultimately Levophed infusion.  Central line was placed in the emergency department.  CT angiogram of the chest was negative for pulmonary embolism, however, it did demonstrate some bibasilar atelectasis versus infiltrate.  PCCM was asked to admit to ICU.  Past Medical History  Diabetes mellitus, Hyperlipidemia, Hypertension, hypothyroidism and Left rib fracture (02/18/2014).  Significant Hospital Events   3/31: Urine culture positive for ESBL E. Coli  4/6: Admitted to the ICU on Levophed gtt  Consults:  None  Procedures:  4/6: right femoral central line   Significant Diagnostic Tests:  4/6: CT angiogram chest > no  evidence of PE, bibasilar infiltrates record in the left. Stable mediastinal lymph node.  4/6: Renal ultrasound > No hydronephrosis. Bladder distention but normal appearance.   4/7: CXR > Bibasilar infiltrates versus atelectasis persisting   Micro Data:  3/31 urine culture - ESBL E. Coli, sensitive to Imipenem 4/6 urine culture - E. Coli, susceptibilities pending  4/6 blood culture - E. Coli bacteremia (2/2) bottles, susceptibilities pending   Antimicrobials:  4/6 - Meropenem >   Interim history/subjective:   Patient arrived from the ED initially on BiPAP but was able to be weaned to 6 L [place]. This AM, patient began to develop increasing work of breathing and worsening hypoxia. She subsequently required titration of supplemental oxygen to 25 L of HFNC with FiO2 of 100%.   Overnight, patient remained hemodynamically stable on Levophed, which was able to be weaned off. No additional febrile events.   This AM, patient endorsed continued intermittent abdominal pain that was generalized. She notes that eating does exacerbated the pain but it still occurs without eating. She endorses SOB but denies chest pain, leg swelling, orthopnea.   Objective   Blood pressure 113/67, pulse (!) 117, temperature 97.8 F (36.6 C), temperature source Axillary, resp. rate (!) 35, height '4\' 10"'$  (1.473 m), weight 61.5 kg, SpO2 93 %.    Vent Mode: BIPAP FiO2 (%):  [50 %-100 %] 100 % Set Rate:  [16 bmp] 16 bmp PEEP:  [5 cmH20] 5 cmH20   Intake/Output Summary (Last 24 hours) at 05/31/2019 1150 Last data filed at 05/31/2019 1100 Gross per 24 hour  Intake 1678.02 ml  Output 1050 ml  Net 628.02 ml   Filed Weights   05/30/19 0950 05/31/19 0351  Weight: 59 kg 61.5 kg   Physical Exam Vitals and nursing note reviewed.  Constitutional:      General: She is not in acute distress.    Appearance: She is normal weight.     Interventions: She is not intubated. HENT:     Head: Normocephalic and atraumatic.      Mouth/Throat:     Mouth: Mucous membranes are moist.     Pharynx: Oropharynx is clear.  Cardiovascular:     Rate and Rhythm: Regular rhythm. Tachycardia present.     Heart sounds: No murmur.  Pulmonary:     Effort: Tachypnea present. No accessory muscle usage or prolonged expiration. She is not intubated.     Breath sounds: Normal air entry. Examination of the right-lower field reveals rales. Examination of the left-lower field reveals rales. Rales present. No decreased breath sounds, wheezing or rhonchi.  Abdominal:     General: Bowel sounds are normal. There is distension (mildly).     Palpations: Abdomen is soft.     Tenderness: There is generalized abdominal tenderness. There is no guarding.     Hernia: No hernia is present.  Musculoskeletal:     Right lower leg: No edema.     Left lower leg: No edema.  Skin:    General: Skin is warm and dry.  Neurological:     General: No focal deficit present.     Mental Status: She is alert and oriented to person, place, and time.  Psychiatric:        Mood and Affect: Mood normal.        Behavior: Behavior normal.    Resolved Hospital Problem list   None   Assessment & Plan:   # Septic Shock - Secondary to ESBL E. Coli urosepsis with progression to E. Coli bacteremia  - Meropenem 1 g BID  - Initially required Levophed, which was weaned off this AM. Restart PRN to maintain MAP >65.   # Acute Hypoxic Respiratory Failure  - Etiology uncertain at this time. CTA ruled out PE.  - TTE notable for elevated pulmonary artery pressure concerning for pulmonary hypertension. She has a hx of some DOE and previously followed up with Dr. Melvyn Novas. HRCT (06/2018) showed bronchiectasis only; no signs of ILD. Hx of chronic cough, felt unlikely to be asthma related.  - On the differential is pneumonia however would expect more diffuse findings than only bibasilar; given she has E. Coli bacteremia, pathogen could be E. Coli though.  - Current oxygen  requirement: 25 L of HFNC, FiO2 100%. Wean as tolerated - Continue Pulmicort BID   # Acute Kidney Injury  - Secondary to septic shock  - Baseline creatinine of 0.6 - 0.7 - Creatinine on admission = 1.42. Improved to 1/13 today after fluid resuscitation - Renal U/S negative for acute findings  - Monitor renal function and urine output   # Demand Ischemia  - Troponins peaked at 1317 and have since decreased steadily.  - EKG without evidence of acute ischemia   # Type 2 Diabetes  - A1c = 8.2 %. Chronically on insulin at home. - SSI (Moderate) - Levemir 12 units   # Hypokalemia  - s/p 40 mEq IV KCl - Recheck in the AM   Best practice:  Diet: Carb-modified  Pain/Anxiety/Delirium protocol (if indicated): N/A VAP protocol (if indicated): N/A DVT prophylaxis: Heparin  GI prophylaxis: Protonix  Glucose control: SSI +  Levemir  Mobility: OOB as tolerated Code Status: FULL Family Communication: Daughter updated on 4/7 Disposition: ICU  Labs   CBC: Recent Labs  Lab 05/30/19 0943 05/30/19 1005 05/30/19 1100 05/31/19 0349  WBC 7.9  --   --  21.7*  NEUTROABS 7.3  --   --   --   HGB 11.2* 17.0* 11.9* 9.7*  HCT 35.8* 50.0* 35.0* 30.5*  MCV 75.2*  --   --  73.5*  PLT 215  --   --  469    Basic Metabolic Panel: Recent Labs  Lab 05/24/19 1331 05/30/19 0943 05/30/19 1005 05/30/19 1100 05/31/19 0349  NA 136 132* 133* 133* 141  K 4.5 3.5 3.2* 3.4* 3.2*  CL 98 100  --  101 106  CO2 26 14*  --   --  21*  GLUCOSE 145* 269*  --  263* 147*  BUN 14 33*  --  32* 22  CREATININE 0.87 1.42*  --  1.30* 1.13*  CALCIUM 9.9 8.2*  --   --  8.1*  MG  --   --   --   --  1.6*  PHOS  --   --   --   --  2.5   GFR: Estimated Creatinine Clearance: 29.8 mL/min (A) (by C-G formula based on SCr of 1.13 mg/dL (H)). Recent Labs  Lab 05/30/19 0943 05/30/19 1216 05/31/19 0349  WBC 7.9  --  21.7*  LATICACIDVEN 3.7* 3.3*  --     Liver Function Tests: Recent Labs  Lab 05/30/19 0943  AST  56*  ALT 27  ALKPHOS 173*  BILITOT 2.0*  PROT 6.2*  ALBUMIN 2.9*   No results for input(s): LIPASE, AMYLASE in the last 168 hours. No results for input(s): AMMONIA in the last 168 hours.  ABG    Component Value Date/Time   HCO3 16.4 (L) 05/30/2019 1220   TCO2 19 (L) 05/30/2019 1100   ACIDBASEDEF 8.2 (H) 05/30/2019 1220   O2SAT 61.3 05/30/2019 1220     Coagulation Profile: Recent Labs  Lab 05/30/19 0943  INR 1.2    Cardiac Enzymes: No results for input(s): CKTOTAL, CKMB, CKMBINDEX, TROPONINI in the last 168 hours.  HbA1C: Hgb A1c MFr Bld  Date/Time Value Ref Range Status  05/30/2019 01:43 PM 8.2 (H) 4.8 - 5.6 % Final    Comment:    (NOTE) Pre diabetes:          5.7%-6.4% Diabetes:              >6.4% Glycemic control for   <7.0% adults with diabetes   03/15/2019 08:24 AM 7.4 (H) 4.6 - 6.5 % Final    Comment:    Glycemic Control Guidelines for People with Diabetes:Non Diabetic:  <6%Goal of Therapy: <7%Additional Action Suggested:  >8%     CBG: Recent Labs  Lab 05/30/19 2035 05/31/19 0015 05/31/19 0348 05/31/19 0825 05/31/19 1142  GLUCAP 220* 190* 147* 106* 100*    Review of Systems:   See Interim History. Negative except as noted above.   Past Medical History  She,  has a past medical history of Diabetes mellitus, Hyperlipidemia, Hypertension, and Left rib fracture (02/18/2014).   Surgical History    Past Surgical History:  Procedure Laterality Date  . APPENDECTOMY    . REFRACTIVE SURGERY     Left Eye  . surgical sterilization  40 years ago     Social History   reports that she has never smoked. She has never used smokeless tobacco.  She reports that she does not drink alcohol or use drugs.   Family History   Her family history includes Cancer in an other family member; Depression in an other family member; Diabetes in her mother and another family member; Hypertension in her father.   Allergies Allergies  Allergen Reactions  . Ace  Inhibitors Cough  . Losartan Cough  . Penicillins Hives  . Sulfa Antibiotics Hives  . Sulfonamide Derivatives Hives     Home Medications  Prior to Admission medications   Medication Sig Start Date End Date Taking? Authorizing Provider  albuterol (PROAIR HFA) 108 (90 Base) MCG/ACT inhaler Inhale 2 puffs into the lungs every 6 (six) hours as needed for wheezing or shortness of breath. 05/24/19   [name], NP  amitriptyline (ELAVIL) 25 MG tablet Take 0.5 tablets (12.5 mg total) by mouth at bedtime. 05/16/19   [name], NP  amLODipine (NORVASC) 5 MG tablet Take 1 tablet (5 mg total) by mouth daily. 10/21/18   [name], NP  aspirin EC 81 MG tablet Take 81 mg by mouth daily.    [provider]  atorvastatin (LIPITOR) 10 MG tablet TAKE 1 TABLET BY MOUTH  DAILY Patient taking differently: Take 10 mg by mouth daily.  01/22/19   [name], NP  b complex vitamins tablet Take 1 tablet by mouth daily.    [provider]  Blood Glucose Monitoring Suppl ([place]) w/Device KIT 1 each by Does not apply route 2 (two) times daily. Use Onetouch verio flex to check blood sugar twice daily.    [provider]  ciprofloxacin (CIPRO) 250 MG tablet Take 1 tablet (250 mg total) by mouth 2 (two) times daily. 05/24/19   [name], NP  cyclobenzaprine (FLEXERIL) 10 MG tablet TAKE 1 TABLET BY MOUTH TWO  TIMES DAILY AS NEEDED FOR  MUSCLE SPASM Patient taking differently: Take 10 mg by mouth 2 (two) times daily as needed for muscle spasms.  05/24/19   [name], NP  DULoxetine (CYMBALTA) 30 MG capsule TAKE 1 CAPSULE BY MOUTH  DAILY Patient taking differently: Take 30 mg by mouth daily.  01/22/19   [name], NP  empagliflozin (JARDIANCE) 10 MG TABS tablet Take 10 mg by mouth daily. Replaces Invokana Patient taking differently: Take 10 mg by mouth daily.  04/24/19   Elayne Snare, MD  famotidine (PEPCID) 20 MG  tablet TAKE 1 TABLET BY MOUTH AT  BEDTIME Patient taking differently: Take 20 mg by mouth at bedtime.  01/23/19   Tanda Rockers, MD  fluconazole (DIFLUCAN) 150 MG tablet Take one tablet by mouth as needed for yeast infection 01/10/19   [name], NP  fluticasone (FLONASE) 50 MCG/ACT nasal spray Place 2 sprays into both nostrils daily. 11/25/17   [name], NP  fluticasone (FLOVENT HFA) 110 MCG/ACT inhaler Inhale 2 puffs into the lungs 2 (two) times daily. 08/03/18   [name], NP  gabapentin (NEURONTIN) 300 MG capsule TAKE 1 CAPSULE BY MOUTH 4  TIMES DAILY Patient taking differently: Take 300 mg by mouth 3 (three) times daily.  05/24/19   [name], NP  glimepiride (AMARYL) 4 MG tablet TAKE 1 TABLET BY MOUTH  TWICE DAILY Patient taking differently: Take 4 mg by mouth in the morning and at bedtime.  01/22/19   [name], NP  glucose blood test strip Use OneTouch Verio Flex test strips as instructed to check blood sugar twice daily. 02/14/19   Elayne Snare, MD  Insulin Detemir (  LEVEMIR FLEXPEN) 100 UNIT/ML Pen Inject 12 Units into the skin at bedtime.  12/23/15   Ann Held, DO  insulin lispro (HUMALOG KWIKPEN) 100 UNIT/ML KiwkPen 8 units with breakfast and lunch, 4 units at evening meal 12/23/15   Carollee Herter, Alferd Apa, DO  Insulin Pen Needle 32G X 4 MM MISC Use 3 per day 01/22/15   Elayne Snare, MD  latanoprost (XALATAN) 0.005 % ophthalmic solution Place 1 drop into both eyes at bedtime. 09/28/16   [provider]  levocetirizine (XYZAL) 5 MG tablet TAKE 1 TABLET BY MOUTH  EVERY EVENING Patient taking differently: Take 5 mg by mouth every evening.  08/19/18   [name], NP  levothyroxine (SYNTHROID) 25 MCG tablet TAKE 1 TABLET BY MOUTH  DAILY BEFORE BREAKFAST Patient taking differently: Take 25 mcg by mouth daily before breakfast.  10/17/18   [name], NP  metFORMIN (GLUCOPHAGE) 1000 MG tablet TAKE 1 TABLET BY MOUTH   TWICE DAILY WITH A MEAL Patient taking differently: Take 1,000 mg by mouth 2 (two) times daily with a meal.  01/22/19   [name], NP  metoprolol succinate (TOPROL-XL) 100 MG 24 hr tablet TAKE 1 TABLET BY MOUTH  DAILY WITH OR IMMEDIATELY  FOLLOWING A MEAL Patient taking differently: Take 100 mg by mouth daily.  01/22/19   [name], NP  montelukast (SINGULAIR) 10 MG tablet Take 1 tablet (10 mg total) by mouth at bedtime. 05/24/19   [name], NP  Multiple Vitamins-Minerals (HAIR SKIN AND NAILS FORMULA PO) Take by mouth daily.    [provider]  niacin (NIASPAN) 500 MG CR tablet Take 2 tablets (1,000 mg total) by mouth at bedtime. 05/24/19   [name], NP  nitrofurantoin, macrocrystal-monohydrate, (MACROBID) 100 MG capsule Take 1 capsule (100 mg total) by mouth 2 (two) times daily. 05/29/19   [name], NP  NONFORMULARY OR COMPOUNDED ITEM Lumbar support belt  #1  As directed 12/19/15   Carollee Herter, Kendrick Fries R, DO  nystatin ointment (MYCOSTATIN) APPLY TOPICALLY TWO TIMES  DAILY AS NEEDED 08/19/18   [name], NP  OneTouch Delica Lancets 26O MISC 1 each by Does not apply route 2 (two) times daily. Use OneTouch Delica lancets to check blood sugar twice daily. 12/19/18   Elayne Snare, MD  pantoprazole (PROTONIX) 40 MG tablet TAKE 1 TABLET BY MOUTH  DAILY Patient taking differently: Take 40 mg by mouth daily.  01/22/19   [name], NP  timolol (BETIMOL) 0.5 % ophthalmic solution Place 1 drop into both eyes 2 (two) times daily. 05/24/19   [name], NP  triamcinolone (KENALOG) 0.1 % paste Use as directed 1 application in the mouth or throat 2 (two) times daily. 07/28/18   [name], NP  vitamin E 400 UNIT capsule Take 400 Units by mouth daily.    [provider]  atorvastatin (LIPITOR) 10 MG tablet TAKE 1 TABLET BY MOUTH  EVERY DAY 08/19/18   [name], NP  DULoxetine (CYMBALTA) 30 MG capsule TAKE 1  CAPSULE BY MOUTH  DAILY 08/19/18   [name], NP  glimepiride (AMARYL) 4 MG tablet TAKE 1 TABLET BY MOUTH  TWICE A DAY 08/19/18   [name], NP  metFORMIN (GLUCOPHAGE) 1000 MG tablet TAKE 1 TABLET BY MOUTH TWO  TIMES DAILY WITH A MEAL 08/19/18   [name], NP  metoprolol succinate (TOPROL-XL) 100 MG 24 hr tablet TAKE 1 TABLET BY MOUTH  DAILY WITH OR IMMEDIATELY  FOLLOWING A MEAL 08/19/18  [name], NP  montelukast (SINGULAIR) 10 MG tablet TAKE 1 TABLET BY MOUTH AT  BEDTIME 08/19/18   [name], NP  pantoprazole (PROTONIX) 40 MG tablet TAKE 1 TABLET BY MOUTH  DAILY 08/19/18   [name], NP    Dr. Jose Persia Internal Medicine PGY-1  Pager: [phone] 05/31/2019, 11:50 AM     PCCM Attending:   85 yo FM, admitted for septic shock secondary to ESBL e coli UTI.  BP 115/73   Pulse (!) 123   Temp 97.6 F (36.4 C) (Oral)   Resp (!) 33   Ht '4\' 10"'$  (1.473 m)   Wt 61.5 kg   SpO2 100%   BMI 28.34 kg/m   Gen: elderly fm, comfortable in bed, talkative, shock resolved  HEENT: tracking  Chest: rrr,s1 s2  Lungs: CTAB no wheeze  Abd: mild tenderness in the lower abd  Labs reviewed, LA improving  Elevated WBC  CX reviewed  BNP elevated   A: Septic Shock  ESBL E coli UTI  AKI secondary to above  Demand ischemia, increased troponin  DMT2  P: Continue meropenem  She has weaned off pressors this am  No additional fluids  Letting her eat and drink Follow her UOP Pain control for abdominal pain Up in chair if possible  Likely consider transfer from ICU tomorrow   This patient is critically ill with multiple organ system failure; which, requires frequent high complexity decision making, assessment, support, evaluation, and titration of therapies. This was completed through the application of advanced monitoring technologies and extensive interpretation of multiple databases. During this encounter critical care time was devoted to  patient care services described in this note for 32 minutes.  Garner Nash, DO [facility] 05/31/2019 5:55 PM

## 2019-05-31 NOTE — Progress Notes (Signed)
  Echocardiogram 2D Echocardiogram has been performed.  Mary Flynn 05/31/2019, 11:15 AM

## 2019-06-01 ENCOUNTER — Telehealth: Payer: Self-pay | Admitting: Family

## 2019-06-01 DIAGNOSIS — A498 Other bacterial infections of unspecified site: Secondary | ICD-10-CM | POA: Diagnosis not present

## 2019-06-01 DIAGNOSIS — R6521 Severe sepsis with septic shock: Secondary | ICD-10-CM | POA: Diagnosis not present

## 2019-06-01 DIAGNOSIS — Z1612 Extended spectrum beta lactamase (ESBL) resistance: Secondary | ICD-10-CM | POA: Diagnosis not present

## 2019-06-01 DIAGNOSIS — A419 Sepsis, unspecified organism: Secondary | ICD-10-CM | POA: Diagnosis not present

## 2019-06-01 LAB — URINE CULTURE: Culture: >=100000 — AB

## 2019-06-01 LAB — CBC
HCT: 30.5 % — ABNORMAL LOW (ref 36.0–46.0)
Hemoglobin: 9.9 g/dL — ABNORMAL LOW (ref 12.0–15.0)
MCH: 23.4 pg — ABNORMAL LOW (ref 26.0–34.0)
MCHC: 32.5 g/dL (ref 30.0–36.0)
MCV: 72.1 fL — ABNORMAL LOW (ref 80.0–100.0)
Platelets: 219 10*3/uL (ref 150–400)
RBC: 4.23 MIL/uL (ref 3.87–5.11)
RDW: 19.9 % — ABNORMAL HIGH (ref 11.5–15.5)
WBC: 17.8 10*3/uL — ABNORMAL HIGH (ref 4.0–10.5)
nRBC: 0.1 % (ref 0.0–0.2)

## 2019-06-01 LAB — GLUCOSE, CAPILLARY
Glucose-Capillary: 115 mg/dL — ABNORMAL HIGH (ref 70–99)
Glucose-Capillary: 138 mg/dL — ABNORMAL HIGH (ref 70–99)
Glucose-Capillary: 148 mg/dL — ABNORMAL HIGH (ref 70–99)
Glucose-Capillary: 179 mg/dL — ABNORMAL HIGH (ref 70–99)
Glucose-Capillary: 197 mg/dL — ABNORMAL HIGH (ref 70–99)
Glucose-Capillary: 90 mg/dL (ref 70–99)

## 2019-06-01 LAB — BASIC METABOLIC PANEL
Anion gap: 11 (ref 5–15)
BUN: 19 mg/dL (ref 8–23)
CO2: 20 mmol/L — ABNORMAL LOW (ref 22–32)
Calcium: 7.9 mg/dL — ABNORMAL LOW (ref 8.9–10.3)
Chloride: 107 mmol/L (ref 98–111)
Creatinine, Ser: 0.9 mg/dL (ref 0.44–1.00)
GFR calc Af Amer: >60 mL/min (ref >60)
GFR calc non Af Amer: 60 mL/min — ABNORMAL LOW (ref >60)
Glucose, Bld: 127 mg/dL — ABNORMAL HIGH (ref 70–99)
Potassium: 3.5 mmol/L (ref 3.5–5.1)
Sodium: 138 mmol/L (ref 135–145)

## 2019-06-01 LAB — MAGNESIUM: Magnesium: 2.3 mg/dL (ref 1.7–2.4)

## 2019-06-01 MED ORDER — POTASSIUM CHLORIDE CRYS ER 20 MEQ PO TBCR
40.0000 meq | EXTENDED_RELEASE_TABLET | Freq: Once | ORAL | Status: AC
  Administered 2019-06-01: 10:00:00 40 meq via ORAL
  Filled 2019-06-01: qty 2

## 2019-06-01 MED ORDER — FUROSEMIDE 10 MG/ML IJ SOLN
40.0000 mg | Freq: Once | INTRAMUSCULAR | Status: AC
  Administered 2019-06-01: 40 mg via INTRAVENOUS
  Filled 2019-06-01: qty 4

## 2019-06-01 MED ORDER — MELATONIN 5 MG PO TABS: 5.0000 mg | ORAL_TABLET | Freq: Every evening | ORAL | Status: DC | PRN

## 2019-06-01 MED ORDER — MELATONIN 3 MG PO TABS
6.0000 mg | ORAL_TABLET | Freq: Every evening | ORAL | Status: DC | PRN
  Administered 2019-06-02 – 2019-06-11 (×7): 6 mg via ORAL
  Filled 2019-06-01 (×8): qty 2

## 2019-06-01 NOTE — Progress Notes (Signed)
[place] Progress Note Patient Name: Mary Flynn DOB: January 18, 1935 MRN: [id number]   Date of Service  06/01/2019  HPI/Events of Note  Melatonin 3 mg did not work for her insomnia yesterday  eICU Interventions  Melatonin dose increased to 5 mg HS PRN insomnia        Frederik Pear 06/01/2019, 10:23 PM

## 2019-06-01 NOTE — Telephone Encounter (Signed)
Talked to patient's daughter and she reports she already has the list.

## 2019-06-01 NOTE — Progress Notes (Addendum)
NAME:  Mary Flynn, MRN:  [id number], DOB:  [birth year], LOS: 2 ADMISSION DATE:  05/30/2019, CONSULTATION DATE:  05/30/2019 REFERRING MD:  EDP CHIEF COMPLAINT:  AMS  Brief History   85 year old female diagnosed with urinary tract infection approximately 1 week prior to arrival that went untreated due to miscommunication.  Patient now presenting in septic shock.  History of present illness   85 year old female with past medical history as below, which is significant for diabetes mellitus, hypertension, and hyperlipidemia.  She presented to PCP office on 3/31 with complaints of cloudy urine and mild dysuria for 2 days.  She was started on ciprofloxacin and urine was sent for culture.  It seems as though there was misunderstanding and she felt as though she was not supposed to take the ciprofloxacin.  Urine culture showed ESBL E. coli and she was switched to [place], however, at that point she was feeling too ill to have the prescription filled and ultimately presented to [facility] emergency department on 4/6 by EMS with complaints of altered mental status.  In the emergency department she was treated as a code sepsis with concern for worsening urinary tract infection and possibly a pulmonary process.  She was found to be febrile and hypotensive requiring IV fluid resuscitation and ultimately Levophed infusion.  Central line was placed in the emergency department.  CT angiogram of the chest was negative for pulmonary embolism, however, it did demonstrate some bibasilar atelectasis versus infiltrate.  PCCM was asked to admit to ICU.  Past Medical History  Diabetes mellitus, Hyperlipidemia, Hypertension, hypothyroidism and Left rib fracture (02/18/2014).  Significant Hospital Events   3/31: Urine culture positive for ESBL E. Coli  4/6: Admitted to the ICU on Levophed gtt  Consults:  None  Procedures:  4/6: right femoral central line 06/01/2019  Significant Diagnostic Tests:  4/6: CT angiogram chest >  no evidence of PE, bibasilar infiltrates record in the left. Stable mediastinal lymph node.  4/6: Renal ultrasound > No hydronephrosis. Bladder distention but normal appearance.   4/7: CXR > Bibasilar infiltrates versus atelectasis persisting   Micro Data:  3/31 urine culture - ESBL E. Coli, sensitive to Imipenem 4/6 urine culture - E. Coli, susceptibilities pending  4/6 blood culture - E. Coli bacteremia (2/2) bottles, susceptibilities pending   Antimicrobials:  4/6 - Meropenem >   Interim history/subjective:   Continues to require high flow FiO2 via heated humidified nasal cannula.  Start diuresis.  Keep in the intensive care unit to observe for effects of diuresis.  Objective   Blood pressure 124/63, pulse (!) 120, temperature (!) 97.3 F (36.3 C), temperature source Axillary, resp. rate (!) 24, height 4\' 10"  (1.473 m), weight 61.5 kg, SpO2 96 %.    FiO2 (%):  [70 %-100 %] 80 %   Intake/Output Summary (Last 24 hours) at 06/01/2019 0930 Last data filed at 06/01/2019 [id number] Gross per 24 hour  Intake 818.56 ml  Output 1425 ml  Net -606.44 ml   Filed Weights   05/30/19 0950 05/31/19 0351 06/01/19 0121  Weight: 59 kg 61.5 kg 61.5 kg    Resolved Hospital Problem list   None   Assessment & Plan:   # Septic Shock Secondary to ESBL E. Coli urosepsis with progression to E. Coli bacteremia  Off pressors On antimicrobial therapy Essentially resolved Start diuresis  # Acute Hypoxic Respiratory Failure  - Etiology uncertain at this time. CTA ruled out PE.  - TTE notable for elevated pulmonary artery pressure concerning  for pulmonary hypertension. She has a hx of some DOE and previously followed up with Dr. Melvyn Novas. HRCT (06/2018) showed bronchiectasis only; no signs of ILD. Hx of chronic cough, felt unlikely to be asthma related.   Wean FiO2 as tolerated currently on high flow oxygen Start gentle diuresis 06/01/2019 which will hopefully improve her pulmonary status Bronchodilators as  needed Pulmonary toilet as tolerated  # Acute Kidney Injury  Lab Results  Component Value Date   CREATININE 0.90 06/01/2019   CREATININE 1.13 (H) 05/31/2019   CREATININE 1.30 (H) 05/30/2019   CREATININE 0.62 06/02/2011    COVID-19 positive test (U07.1, COVID-19) with Acute Respiratory Distress Syndrome (ARDS) (J80, ARDS)  Renal functions returned to normal therefore will start gentle diuresis on 06/01/2019 for presumed volume overload  # Demand Ischemia  Continue to monitor  # Type 2 Diabetes  CBG (last 3)  Recent Labs    05/31/19 2344 06/01/19 0335 06/01/19 0713  GLUCAP 161* 148* 90    Scale insulin protocol  # Hypokalemia  Recent Labs  Lab 05/30/19 1100 05/31/19 0349 06/01/19 0348  K 3.4* 3.2* 3.5    Monitor and replete as needed  Best practice:  Diet: Carb-modified  Pain/Anxiety/Delirium protocol (if indicated): N/A VAP protocol (if indicated): N/A DVT prophylaxis: Heparin  GI prophylaxis: Protonix  Glucose control: SSI + Levemir  Mobility: OOB as tolerated Code Status: FULL Family Communication: Patient updated at bedside Disposition: ICU  Labs   CBC: Recent Labs  Lab 05/30/19 0943 05/30/19 1005 05/30/19 1100 05/31/19 0349 06/01/19 0348  WBC 7.9  --   --  21.7* 17.8*  NEUTROABS 7.3  --   --   --   --   HGB 11.2* 17.0* 11.9* 9.7* 9.9*  HCT 35.8* 50.0* 35.0* 30.5* 30.5*  MCV 75.2*  --   --  73.5* 72.1*  PLT 215  --   --  226 [id number]    Basic Metabolic Panel: Recent Labs  Lab 05/30/19 0943 05/30/19 1005 05/30/19 1100 05/31/19 0349 06/01/19 0348  NA 132* 133* 133* 141 138  K 3.5 3.2* 3.4* 3.2* 3.5  CL 100  --  101 106 107  CO2 14*  --   --  21* 20*  GLUCOSE 269*  --  263* 147* 127*  BUN 33*  --  32* 22 19  CREATININE 1.42*  --  1.30* 1.13* 0.90  CALCIUM 8.2*  --   --  8.1* 7.9*  MG  --   --   --  1.6* 2.3  PHOS  --   --   --  2.5  --    GFR: Estimated Creatinine Clearance: 37.4 mL/min (by C-G formula based on SCr of 0.9  mg/dL). Recent Labs  Lab 05/30/19 0943 05/30/19 1216 05/31/19 0349 06/01/19 0348  WBC 7.9  --  21.7* 17.8*  LATICACIDVEN 3.7* 3.3*  --   --     Liver Function Tests: Recent Labs  Lab 05/30/19 0943  AST 56*  ALT 27  ALKPHOS 173*  BILITOT 2.0*  PROT 6.2*  ALBUMIN 2.9*   No results for input(s): LIPASE, AMYLASE in the last 168 hours. No results for input(s): AMMONIA in the last 168 hours.  ABG    Component Value Date/Time   HCO3 16.4 (L) 05/30/2019 1220   TCO2 19 (L) 05/30/2019 1100   ACIDBASEDEF 8.2 (H) 05/30/2019 1220   O2SAT 61.3 05/30/2019 1220     Coagulation Profile: Recent Labs  Lab 05/30/19 0943  INR 1.2  Cardiac Enzymes: No results for input(s): CKTOTAL, CKMB, CKMBINDEX, TROPONINI in the last 168 hours.  HbA1C: Hgb A1c MFr Bld  Date/Time Value Ref Range Status  05/30/2019 01:43 PM 8.2 (H) 4.8 - 5.6 % Final    Comment:    (NOTE) Pre diabetes:          5.7%-6.4% Diabetes:              >6.4% Glycemic control for   <7.0% adults with diabetes   03/15/2019 08:24 AM 7.4 (H) 4.6 - 6.5 % Final    Comment:    Glycemic Control Guidelines for People with Diabetes:Non Diabetic:  <6%Goal of Therapy: <7%Additional Action Suggested:  >8%     CBG: Recent Labs  Lab 05/31/19 1535 05/31/19 1942 05/31/19 2344 06/01/19 0335 06/01/19 0713  GLUCAP 121* 128* 161* 148* 90     App cct 30 min  Richardson Landry Minor ACNP Acute Care Nurse Practitioner [place] Please consult [place] 06/01/2019, 9:32 AM    PCCM attending:  85 year old female admitted for urinary tract infection, septic shock, on norepinephrine initially weaned rather quickly, cultures ESBL positive, treated with meropenem.  Overall improving.  Up in chair this morning.  BP 112/73   Pulse (!) 117   Temp 98 F (36.7 C) (Axillary)   Resp (!) 31   Ht 4\' 10"  (1.473 m)   Wt 61.5 kg   SpO2 97%   BMI 28.34 kg/m   General: Elderly female resting in bed Heart: Regular rate  rhythm S1-S2 Lungs: No crackles no wheeze Abdomen: Soft, less tender but still little tender suprapubically.  Labs: Reviewed  Assessment: Septic shock secondary to ESBL E. coli UTI AKI Demand ischemia, increased to prone Type 2 diabetes Positive cumulative fluid balance, slightly elevated proBNP.  Plan: Continue meropenem Remove femoral catheter Encourage p.o. intake Follow urine output Up in chair Work with PT OT as much as tolerated Okay to observe in ICU for another day.  As she appears to be a little weak. Dose of diuretics today.  Garner Nash, DO [facility] 06/01/2019 5:56 PM

## 2019-06-01 NOTE — Telephone Encounter (Signed)
Patient is in the hospital , Husband is asking for a print out of me list .

## 2019-06-02 ENCOUNTER — Inpatient Hospital Stay (HOSPITAL_COMMUNITY): Payer: Medicare Other

## 2019-06-02 DIAGNOSIS — A419 Sepsis, unspecified organism: Secondary | ICD-10-CM | POA: Diagnosis not present

## 2019-06-02 DIAGNOSIS — Z1612 Extended spectrum beta lactamase (ESBL) resistance: Secondary | ICD-10-CM | POA: Diagnosis not present

## 2019-06-02 DIAGNOSIS — R6521 Severe sepsis with septic shock: Secondary | ICD-10-CM | POA: Diagnosis not present

## 2019-06-02 DIAGNOSIS — A498 Other bacterial infections of unspecified site: Secondary | ICD-10-CM | POA: Diagnosis not present

## 2019-06-02 LAB — BASIC METABOLIC PANEL
Anion gap: 15 (ref 5–15)
BUN: 14 mg/dL (ref 8–23)
CO2: 20 mmol/L — ABNORMAL LOW (ref 22–32)
Calcium: 8.5 mg/dL — ABNORMAL LOW (ref 8.9–10.3)
Chloride: 104 mmol/L (ref 98–111)
Creatinine, Ser: 0.67 mg/dL (ref 0.44–1.00)
GFR calc Af Amer: >60 mL/min (ref >60)
GFR calc non Af Amer: >60 mL/min (ref >60)
Glucose, Bld: 155 mg/dL — ABNORMAL HIGH (ref 70–99)
Potassium: 3.5 mmol/L (ref 3.5–5.1)
Sodium: 139 mmol/L (ref 135–145)

## 2019-06-02 LAB — CULTURE, BLOOD (ROUTINE X 2): Special Requests: ADEQUATE

## 2019-06-02 LAB — GLUCOSE, CAPILLARY
Glucose-Capillary: 113 mg/dL — ABNORMAL HIGH (ref 70–99)
Glucose-Capillary: 121 mg/dL — ABNORMAL HIGH (ref 70–99)
Glucose-Capillary: 132 mg/dL — ABNORMAL HIGH (ref 70–99)
Glucose-Capillary: 143 mg/dL — ABNORMAL HIGH (ref 70–99)
Glucose-Capillary: 171 mg/dL — ABNORMAL HIGH (ref 70–99)
Glucose-Capillary: 258 mg/dL — ABNORMAL HIGH (ref 70–99)

## 2019-06-02 LAB — PHOSPHORUS: Phosphorus: 2.4 mg/dL — ABNORMAL LOW (ref 2.5–4.6)

## 2019-06-02 LAB — MAGNESIUM: Magnesium: 1.8 mg/dL (ref 1.7–2.4)

## 2019-06-02 MED ORDER — FUROSEMIDE 10 MG/ML IJ SOLN
20.0000 mg | Freq: Once | INTRAMUSCULAR | Status: AC
  Administered 2019-06-02: 13:00:00 20 mg via INTRAVENOUS
  Filled 2019-06-02: qty 2

## 2019-06-02 MED ORDER — K PHOS MONO-SOD PHOS DI & MONO 155-852-130 MG PO TABS
500.0000 mg | ORAL_TABLET | Freq: Once | ORAL | Status: AC
  Administered 2019-06-02: 500 mg via ORAL
  Filled 2019-06-02 (×2): qty 2

## 2019-06-02 MED ORDER — IPRATROPIUM-ALBUTEROL 0.5-2.5 (3) MG/3ML IN SOLN: 3.0000 mL | Freq: Four times a day (QID) | RESPIRATORY_TRACT | Status: DC | PRN

## 2019-06-02 MED ORDER — MAGIC MOUTHWASH W/LIDOCAINE
5.0000 mL | Freq: Four times a day (QID) | ORAL | Status: DC | PRN
  Administered 2019-06-02 – 2019-06-03 (×2): 5 mL via ORAL
  Filled 2019-06-02 (×3): qty 5

## 2019-06-02 MED ORDER — POTASSIUM CHLORIDE CRYS ER 20 MEQ PO TBCR
40.0000 meq | EXTENDED_RELEASE_TABLET | Freq: Once | ORAL | Status: AC
  Administered 2019-06-02: 40 meq via ORAL
  Filled 2019-06-02: qty 2

## 2019-06-02 NOTE — Progress Notes (Signed)
Pharmacy Antibiotic Note  Mary Flynn is a 85 y.o. female admitted on 05/30/2019 with AMS.  Patient has a recent ESBL E.coli UTI.  Pharmacy has been consulted for meropenem dosing.  Blood and urine culture this admission also growing ESBL E.coli.  SCr down 0.9, CrCL 37 ml/min, afebrile, WBC 17.8.  Plan: Continue Merrem 1g IV Q12H Monitor renal fxn, clinical progress   Height: 4\' 10"  (147.3 cm) Weight: 61.5 kg (135 lb 9.3 oz) IBW/kg (Calculated) : 40.9  Temp (24hrs), Avg:98 F (36.7 C), Min:97.4 F (36.3 C), Max:98.7 F (37.1 C)  Recent Labs  Lab 05/30/19 0943 05/30/19 1100 05/30/19 1216 05/31/19 0349 06/01/19 0348  WBC 7.9  --   --  21.7* 17.8*  CREATININE 1.42* 1.30*  --  1.13* 0.90  LATICACIDVEN 3.7*  --  3.3*  --   --     Estimated Creatinine Clearance: 37.4 mL/min (by C-G formula based on SCr of 0.9 mg/dL).    Allergies  Allergen Reactions  . Ace Inhibitors Cough  . Losartan Cough  . Penicillins Hives  . Sulfa Antibiotics Hives  . Sulfonamide Derivatives Hives    Merrem 4/6 >> CTX/Azith x1 4/6 Macrobid PTA  3/31 UCx PTA - ESBL E.coli 4/6 covid / flu / MRSA PCR - negative 4/6 BCx - ESBL E.coli 4/6 UCx - ESBL E.coli  Trey Gulbranson D. Mina Marble, PharmD, BCPS, [place] 06/02/2019, 8:27 AM

## 2019-06-02 NOTE — Progress Notes (Addendum)
NAME:  Mary Flynn, MRN:  [id number], DOB:  [birth year], LOS: 3 ADMISSION DATE:  05/30/2019, CONSULTATION DATE:  05/30/2019 REFERRING MD:  EDP CHIEF COMPLAINT:  AMS  Brief History   85 year old female diagnosed with urinary tract infection approximately 1 week prior to arrival that went untreated due to miscommunication.  Patient now presenting in septic shock.  History of present illness   85 year old female with past medical history as below, which is significant for diabetes mellitus, hypertension, and hyperlipidemia.  She presented to PCP office on 3/31 with complaints of cloudy urine and mild dysuria for 2 days.  She was started on ciprofloxacin and urine was sent for culture.  It seems as though there was misunderstanding and she felt as though she was not supposed to take the ciprofloxacin.  Urine culture showed ESBL E. coli and she was switched to [place], however, at that point she was feeling too ill to have the prescription filled and ultimately presented to [facility] emergency department on 4/6 by EMS with complaints of altered mental status.  In the emergency department she was treated as a code sepsis with concern for worsening urinary tract infection and possibly a pulmonary process.  She was found to be febrile and hypotensive requiring IV fluid resuscitation and ultimately Levophed infusion.  Central line was placed in the emergency department.  CT angiogram of the chest was negative for pulmonary embolism, however, it did demonstrate some bibasilar atelectasis versus infiltrate.  PCCM was asked to admit to ICU.  Past Medical History  Diabetes mellitus, Hyperlipidemia, Hypertension, hypothyroidism and Left rib fracture (02/18/2014).  Significant Hospital Events   3/31: Urine culture positive for ESBL E. Coli  4/6: Admitted to the ICU on Levophed gtt  Consults:  None  Procedures:  4/6: right femoral central line 06/01/2019  Significant Diagnostic Tests:  4/6: CT angiogram chest >  no evidence of PE, bibasilar infiltrates record in the left. Stable mediastinal lymph node.  4/6: Renal ultrasound > No hydronephrosis. Bladder distention but normal appearance.   4/7: CXR > Bibasilar infiltrates versus atelectasis persisting  4/9: CXR > Areas of ill-defined opacity in both lower lungs as well as right mid lung and left peri-hilar region, suspicious for multi-pneumonia. Stable cardiac prominence.   Micro Data:  3/31 urine culture - ESBL E. Coli, sensitive to Imipenem 4/6 urine culture - E. Coli, susceptibilities pending  4/6 blood culture - E. Coli bacteremia (2/2) bottles, susceptibilities pending   Antimicrobials:  4/6 - Meropenem >   Interim history/subjective:   [name] states she would like to move around more today and take a shower. She feels her breathing is improving today. She denies any pain at the moment, including chest pain.   Objective   Blood pressure 125/72, pulse (!) 113, temperature 97.8 F (36.6 C), temperature source Oral, resp. rate (!) 32, height 4\' 10"  (1.473 m), weight 61.5 kg, SpO2 94 %.    FiO2 (%):  [80 %] 80 %   Intake/Output Summary (Last 24 hours) at 06/02/2019 0646 Last data filed at 06/01/2019 1740 Gross per 24 hour  Intake 210 ml  Output 1400 ml  Net -1190 ml   Filed Weights   05/30/19 0950 05/31/19 0351 06/01/19 0121  Weight: 59 kg 61.5 kg 61.5 kg    Resolved Hospital Problem list   Septic Shock Acute Kidney Injury Demand Ischemia   Assessment & Plan:   # ESBL E. Coli Bacteremia - Urinary source  - Meropenem per pharmacy  #  Acute Hypoxic Respiratory Failure  - Etiology uncertain at this time. CTA ruled out PE.  - TTE notable for elevated pulmonary artery pressure concerning for pulmonary hypertension. She has a hx of some DOE and previously followed up with Dr. Melvyn Novas. HRCT (06/2018) showed bronchiectasis only; no signs of ILD. Hx of chronic cough, felt unlikely to be asthma related.  - CXR today notes worsening  opacities, suspicious for multi-focal pneumonia, however clinically less likely.  - Wean FiO2 as tolerated currently on high flow oxygen - Bronchodilators as needed - Pulmonary toilet as tolerated - PT/OT for mobilization   # Volume Overload - Diuresis started on 06/01/2019 for presumed volume overload. - Today, she appears euvolemic and had good urine output.  - Continue with gentle diuresis today @ Lasix 20 mg IV daily   # Type 2 Diabetes  Scale insulin protocol  # Hypokalemia  Monitor and replete as needed  # Hyperphosphatemia  Monitor and replete as needed   Best practice:  Diet: Carb-modified  Pain/Anxiety/Delirium protocol (if indicated): N/A VAP protocol (if indicated): N/A DVT prophylaxis: Heparin  GI prophylaxis: Protonix  Glucose control: SSI + Levemir  Mobility: OOB as tolerated Code Status: FULL Family Communication:  Disposition: ICU  Labs   CBC: Recent Labs  Lab 05/30/19 0943 05/30/19 1005 05/30/19 1100 05/31/19 0349 06/01/19 0348  WBC 7.9  --   --  21.7* 17.8*  NEUTROABS 7.3  --   --   --   --   HGB 11.2* 17.0* 11.9* 9.7* 9.9*  HCT 35.8* 50.0* 35.0* 30.5* 30.5*  MCV 75.2*  --   --  73.5* 72.1*  PLT 215  --   --  226 [id number]    Basic Metabolic Panel: Recent Labs  Lab 05/30/19 0943 05/30/19 1005 05/30/19 1100 05/31/19 0349 06/01/19 0348  NA 132* 133* 133* 141 138  K 3.5 3.2* 3.4* 3.2* 3.5  CL 100  --  101 106 107  CO2 14*  --   --  21* 20*  GLUCOSE 269*  --  263* 147* 127*  BUN 33*  --  32* 22 19  CREATININE 1.42*  --  1.30* 1.13* 0.90  CALCIUM 8.2*  --   --  8.1* 7.9*  MG  --   --   --  1.6* 2.3  PHOS  --   --   --  2.5  --    GFR: Estimated Creatinine Clearance: 37.4 mL/min (by C-G formula based on SCr of 0.9 mg/dL). Recent Labs  Lab 05/30/19 0943 05/30/19 1216 05/31/19 0349 06/01/19 0348  WBC 7.9  --  21.7* 17.8*  LATICACIDVEN 3.7* 3.3*  --   --     Liver Function Tests: Recent Labs  Lab 05/30/19 0943  AST 56*  ALT 27    ALKPHOS 173*  BILITOT 2.0*  PROT 6.2*  ALBUMIN 2.9*   No results for input(s): LIPASE, AMYLASE in the last 168 hours. No results for input(s): AMMONIA in the last 168 hours.  ABG    Component Value Date/Time   HCO3 16.4 (L) 05/30/2019 1220   TCO2 19 (L) 05/30/2019 1100   ACIDBASEDEF 8.2 (H) 05/30/2019 1220   O2SAT 61.3 05/30/2019 1220     Coagulation Profile: Recent Labs  Lab 05/30/19 0943  INR 1.2    Cardiac Enzymes: No results for input(s): CKTOTAL, CKMB, CKMBINDEX, TROPONINI in the last 168 hours.  HbA1C: Hgb A1c MFr Bld  Date/Time Value Ref Range Status  05/30/2019 01:43 PM 8.2 (H) 4.8 -  5.6 % Final    Comment:    (NOTE) Pre diabetes:          5.7%-6.4% Diabetes:              >6.4% Glycemic control for   <7.0% adults with diabetes   03/15/2019 08:24 AM 7.4 (H) 4.6 - 6.5 % Final    Comment:    Glycemic Control Guidelines for People with Diabetes:Non Diabetic:  <6%Goal of Therapy: <7%Additional Action Suggested:  >8%     CBG: Recent Labs  Lab 06/01/19 1125 06/01/19 1512 06/01/19 2001 06/01/19 2338 06/02/19 0335  GLUCAP 115* 138* 197* 179* 121*   Dr. Jose Persia Internal Medicine PGY-1  Pager: [phone] 06/02/2019, 6:47 AM   Pulmonary critical care attending:  This is an 85 year old female admitted for ESBL E. coli urinary tract infection, bacteremia developed acute hypoxemic respiratory failure volume overload, elevated troponin and demand ischemia, history of type 2 diabetes.  Patient spent several days in the ICU was never intubated.  Was on high flow required some diuresis with good output.  At this point on 10 L nasal cannula.  Able to get up with PT.  No acute events overnight.  BP 115/70   Pulse (!) 103   Temp 97.7 F (36.5 C) (Oral)   Resp 20   Ht 4\' 10"  (1.473 m)   Wt 61.5 kg   SpO2 96%   BMI 28.34 kg/m  General: Elderly female resting in bed no distress HEENT: Tracking appropriately Heart: Regular rhythm S1-S2 Lungs: Clear  to auscultation bilaterally, no crackles no wheeze  Labs: Reviewed white blood cell count improving, serum creatinine 0.67  Chest x-ray: Increased vascular congestion. The patient's images have been independently reviewed by me.    Assessment: ESBL E. coli urinary tract infection, bacteremia Septic shock resolved Acute hypoxemic respiratory failure Volume overload likely from resuscitation efforts Positive chemo fluid balance  Plan: 20 mg lasix X1  Up with PT OT Up out of bed in chair if possible Encourage I-S Wean from FiO2 as tolerated maintain sats greater than 90%. Likely stable for transfer from intensive care unit today. Continue meropenem  We will talk with the [facility] hospitalist service for pickup tomorrow.  Garner Nash, DO [facility] [facility] 06/02/2019 6:22 PM

## 2019-06-02 NOTE — Evaluation (Addendum)
Physical Therapy Evaluation Patient Details Name: Mary Flynn MRN: [id number] DOB: 02/03/1935 Today's Date: 06/02/2019   History of Present Illness  pt is an 85 y/o female with h/o DM and HTN with recent dx of UTI.  She apparently misunderstood when to take the Cipro and by day of admission had AMS.  Work up for worsening UTI/sepsis and acute hypoxic respiratory failure.  Clinical Impression  Pt admitted with/for UTI/sepsis with respiratory complications.  Pt needs no more than min guard assist for basic mobility at this time..  Pt currently limited functionally due to the problems listed below.  (see problems list.)  Pt will benefit from PT to maximize function and safety to be able to get home safely with available assist .     Follow Up Recommendations Home health PT;Supervision/Assistance - 24 hour;Other (comment)(as long as pt is in need of portable O2 from deconditioning)    Equipment Recommendations  None recommended by PT    Recommendations for Other Services       Precautions / Restrictions Precautions Precautions: Fall(minimal risk)      Mobility  Bed Mobility Overal bed mobility: Needs Assistance Bed Mobility: Supine to Sit     Supine to sit: Min guard     General bed mobility comments: slow and distracted, but no assist needed.  Transfers Overall transfer level: Needs assistance   Transfers: Sit to/from Stand Sit to Stand: Min guard            Ambulation/Gait Ambulation/Gait assistance: Min guard   Assistive device: None       General Gait Details: marched in place with low amplitude steps and no deviation, no assist needed limited by lines HFNC, but sats at 95-98% and HR in the 100's to low 110's bpm  Stairs            Wheelchair Mobility    Modified Rankin (Stroke Patients Only)       Balance Overall balance assessment: Needs assistance   Sitting balance-[name]: Good     Standing balance support: No upper extremity  supported Standing balance-[name]: Fair                               Pertinent Vitals/Pain Pain Assessment: Faces Faces Pain Scale: No hurt Pain Intervention(s): Monitored during session    Home Living Family/patient expects to be discharged to:: Private residence Living Arrangements: Spouse/significant other Available Help at Discharge: Available 24 hours/day Type of Home: Apartment Home Access: Level entry     Home Layout: One level Home Equipment: Tub bench;Shower seat Additional Comments: pt was tangential and dystractible so hard to ascertain info.    Prior Function Level of Independence: Independent               Hand Dominance        Extremity/Trunk Assessment   Upper Extremity Assessment Upper Extremity Assessment: Overall WFL for tasks assessed    Lower Extremity Assessment Lower Extremity Assessment: Overall WFL for tasks assessed(mild proximal weaknesses)       Communication   Communication: Other (comment)(distractible)  Cognition Arousal/Alertness: Awake/alert Behavior During Therapy: WFL for tasks assessed/performed Overall Cognitive Status: Within Functional Limits for tasks assessed(distractible)                                        General Comments  General comments (skin integrity, edema, etc.): see gait details for vitals    Exercises     Assessment/Plan    PT Assessment Patient needs continued PT services  PT Problem List Decreased strength;Decreased activity tolerance;Decreased mobility;Cardiopulmonary status limiting activity;Decreased knowledge of use of DME       PT Treatment Interventions Gait training;Therapeutic activities;Functional mobility training;DME instruction;Patient/family education;Balance training    PT Goals (Current goals can be found in the Care Plan section)  Acute Rehab PT Goals Patient Stated Goal: back home PT Goal Formulation: With patient Time For Goal  Achievement: 06/09/19 Potential to Achieve Goals: Good    Frequency Min 3X/week   Barriers to discharge        Co-evaluation               AM-PAC PT "6 Clicks" Mobility  Outcome Measure Help needed turning from your back to your side while in a flat bed without using bedrails?: A Little Help needed moving from lying on your back to sitting on the side of a flat bed without using bedrails?: A Little Help needed moving to and from a bed to a chair (including a wheelchair)?: A Little Help needed standing up from a chair using your arms (e.g., wheelchair or bedside chair)?: A Little Help needed to walk in hospital room?: A Little Help needed climbing 3-5 steps with a railing? : A Little 6 Click Score: 18    End of Session   Activity Tolerance: Patient tolerated treatment well Patient left: in chair;with call bell/phone within reach;with chair alarm set Nurse Communication: Mobility status PT Visit Diagnosis: Other abnormalities of gait and mobility (R26.89);Difficulty in walking, not elsewhere classified (R26.2)    Time: [id number] PT Time Calculation (min) (ACUTE ONLY): 22 min   Charges:   PT Evaluation $PT Eval Moderate Complexity: 1 Mod          06/02/2019  Ginger Carne., PT Acute Rehabilitation Services [phone]  (pager) [phone]  (office)  Tessie Fass Levander Katzenstein 06/02/2019, 5:38 PM

## 2019-06-03 DIAGNOSIS — R6521 Severe sepsis with septic shock: Secondary | ICD-10-CM | POA: Diagnosis not present

## 2019-06-03 DIAGNOSIS — A419 Sepsis, unspecified organism: Secondary | ICD-10-CM | POA: Diagnosis not present

## 2019-06-03 LAB — BASIC METABOLIC PANEL
Anion gap: 12 (ref 5–15)
BUN: 10 mg/dL (ref 8–23)
CO2: 22 mmol/L (ref 22–32)
Calcium: 8.1 mg/dL — ABNORMAL LOW (ref 8.9–10.3)
Chloride: 108 mmol/L (ref 98–111)
Creatinine, Ser: 0.65 mg/dL (ref 0.44–1.00)
GFR calc Af Amer: >60 mL/min (ref >60)
GFR calc non Af Amer: >60 mL/min (ref >60)
Glucose, Bld: 103 mg/dL — ABNORMAL HIGH (ref 70–99)
Potassium: 3.3 mmol/L — ABNORMAL LOW (ref 3.5–5.1)
Sodium: 142 mmol/L (ref 135–145)

## 2019-06-03 LAB — CBC WITH DIFFERENTIAL/PLATELET
Abs Immature Granulocytes: 0 10*3/uL (ref 0.00–0.07)
Basophils Absolute: 0 10*3/uL (ref 0.0–0.1)
Basophils Relative: 0 %
Eosinophils Absolute: 0.8 10*3/uL — ABNORMAL HIGH (ref 0.0–0.5)
Eosinophils Relative: 7 %
HCT: 31.5 % — ABNORMAL LOW (ref 36.0–46.0)
Hemoglobin: 10.1 g/dL — ABNORMAL LOW (ref 12.0–15.0)
Lymphocytes Relative: 10 %
Lymphs Abs: 1.1 10*3/uL (ref 0.7–4.0)
MCH: 23.1 pg — ABNORMAL LOW (ref 26.0–34.0)
MCHC: 32.1 g/dL (ref 30.0–36.0)
MCV: 72.1 fL — ABNORMAL LOW (ref 80.0–100.0)
Monocytes Absolute: 0.9 10*3/uL (ref 0.1–1.0)
Monocytes Relative: 8 %
Neutro Abs: 8.5 10*3/uL — ABNORMAL HIGH (ref 1.7–7.7)
Neutrophils Relative %: 75 %
Platelets: 238 10*3/uL (ref 150–400)
RBC: 4.37 MIL/uL (ref 3.87–5.11)
RDW: 19.3 % — ABNORMAL HIGH (ref 11.5–15.5)
WBC: 11.3 10*3/uL — ABNORMAL HIGH (ref 4.0–10.5)
nRBC: 0 /100 WBC
nRBC: 0.3 % — ABNORMAL HIGH (ref 0.0–0.2)

## 2019-06-03 LAB — GLUCOSE, CAPILLARY
Glucose-Capillary: 125 mg/dL — ABNORMAL HIGH (ref 70–99)
Glucose-Capillary: 88 mg/dL (ref 70–99)
Glucose-Capillary: 190 mg/dL — ABNORMAL HIGH (ref 70–99)
Glucose-Capillary: 139 mg/dL — ABNORMAL HIGH (ref 70–99)
Glucose-Capillary: 124 mg/dL — ABNORMAL HIGH (ref 70–99)
Glucose-Capillary: 207 mg/dL — ABNORMAL HIGH (ref 70–99)

## 2019-06-03 LAB — PHOSPHORUS: Phosphorus: 3.4 mg/dL (ref 2.5–4.6)

## 2019-06-03 LAB — MAGNESIUM: Magnesium: 1.6 mg/dL — ABNORMAL LOW (ref 1.7–2.4)

## 2019-06-03 MED ORDER — METFORMIN HCL 500 MG PO TABS
500.0000 mg | ORAL_TABLET | Freq: Two times a day (BID) | ORAL | Status: DC
  Administered 2019-06-03 – 2019-06-06 (×7): 500 mg via ORAL
  Filled 2019-06-03 (×7): qty 1

## 2019-06-03 MED ORDER — MAGNESIUM OXIDE 400 (241.3 MG) MG PO TABS
800.0000 mg | ORAL_TABLET | Freq: Two times a day (BID) | ORAL | Status: DC
  Administered 2019-06-03 – 2019-06-12 (×19): 800 mg via ORAL
  Filled 2019-06-03 (×20): qty 2

## 2019-06-03 MED ORDER — GABAPENTIN 100 MG PO CAPS
100.0000 mg | ORAL_CAPSULE | Freq: Two times a day (BID) | ORAL | Status: DC
  Administered 2019-06-03 – 2019-06-12 (×19): 100 mg via ORAL
  Filled 2019-06-03 (×19): qty 1

## 2019-06-03 MED ORDER — METOPROLOL TARTRATE 12.5 MG HALF TABLET
12.5000 mg | ORAL_TABLET | Freq: Two times a day (BID) | ORAL | Status: DC
  Administered 2019-06-03 – 2019-06-04 (×4): 12.5 mg via ORAL
  Filled 2019-06-03 (×4): qty 1

## 2019-06-03 MED ORDER — ASPIRIN EC 81 MG PO TBEC
81.0000 mg | DELAYED_RELEASE_TABLET | Freq: Every day | ORAL | Status: DC
  Administered 2019-06-03 – 2019-06-12 (×10): 81 mg via ORAL
  Filled 2019-06-03 (×10): qty 1

## 2019-06-03 MED ORDER — POTASSIUM CHLORIDE CRYS ER 20 MEQ PO TBCR
40.0000 meq | EXTENDED_RELEASE_TABLET | Freq: Two times a day (BID) | ORAL | Status: DC
  Administered 2019-06-03 – 2019-06-09 (×13): 40 meq via ORAL
  Filled 2019-06-03 (×13): qty 2

## 2019-06-03 MED ORDER — DULOXETINE HCL 30 MG PO CPEP
30.0000 mg | ORAL_CAPSULE | Freq: Every day | ORAL | Status: DC
  Administered 2019-06-03 – 2019-06-12 (×10): 30 mg via ORAL
  Filled 2019-06-03 (×10): qty 1

## 2019-06-03 MED ORDER — INSULIN LISPRO 100 UNIT/ML (KWIKPEN): 4.0000 [IU] | PEN_INJECTOR | Freq: Three times a day (TID) | SUBCUTANEOUS | Status: DC

## 2019-06-03 MED ORDER — INSULIN ASPART 100 UNIT/ML ~~LOC~~ SOLN
0.0000 [IU] | Freq: Three times a day (TID) | SUBCUTANEOUS | Status: DC
  Administered 2019-06-03: 2 [IU] via SUBCUTANEOUS
  Administered 2019-06-04: 3 [IU] via SUBCUTANEOUS
  Administered 2019-06-04: 2 [IU] via SUBCUTANEOUS
  Administered 2019-06-04 – 2019-06-06 (×3): 3 [IU] via SUBCUTANEOUS
  Administered 2019-06-06: 5 [IU] via SUBCUTANEOUS
  Administered 2019-06-06: 2 [IU] via SUBCUTANEOUS
  Administered 2019-06-07: 3 [IU] via SUBCUTANEOUS
  Administered 2019-06-07: 5 [IU] via SUBCUTANEOUS
  Administered 2019-06-07: 2 [IU] via SUBCUTANEOUS
  Administered 2019-06-08: 5 [IU] via SUBCUTANEOUS
  Administered 2019-06-08: 2 [IU] via SUBCUTANEOUS
  Administered 2019-06-08: 5 [IU] via SUBCUTANEOUS
  Administered 2019-06-09: 8 [IU] via SUBCUTANEOUS
  Administered 2019-06-09: 11 [IU] via SUBCUTANEOUS
  Administered 2019-06-09: 2 [IU] via SUBCUTANEOUS
  Administered 2019-06-10: 3 [IU] via SUBCUTANEOUS

## 2019-06-03 MED ORDER — INSULIN ASPART 100 UNIT/ML ~~LOC~~ SOLN
4.0000 [IU] | Freq: Three times a day (TID) | SUBCUTANEOUS | Status: DC
  Administered 2019-06-03 – 2019-06-09 (×18): 4 [IU] via SUBCUTANEOUS

## 2019-06-03 NOTE — Progress Notes (Signed)
Assesment/PLAN Septic shock secondary to ESBL E. Coli Meropenem to continue at least for 5 days ending on 06/04/2019 White count and sepsis physiology resolving pretty well other than tachycardia for which I have started low-dose metoprolol twice daily Transfer to telemetry whenever available TY 2 demand ischemia Felt secondary to severe sepsis Echo 4/7 EF 70-75% hyperdynamic RVP 15, RVSP 62-needs control of pressures-metoprolol as above Patient seems euvolemic at this time with -2.27 L fluid balance from admission Hypoxic respiratory failure superimposed on cyclical cough vocal cord degeneration probable bronchiectasis and component of pulmonary hypertension Unfounded believes of her underlying medical issues 2/2 "cold fluids"-she seems to be getting better-nursing aware to try to de-escalate oxygen levels AKI SUPERIMPOSED ON CKD 1 GFR 45 on admission Hypokalemia/hypomagnesemia Improved with improvement of sepsis physiology-keep volumes even-Lasix given 4/9 no further need DM TY 2 Nursing aware that family can bring appropriate food choices as long as a diabetic friendly-glycemia well controlled-continue Levemir 12, SSI 4 units added to sliding scale Reimplement gabapentin lower dose 100 twice daily Will start back Amaryl 4 every morning p.m. Metformin resumed at lower dose 500 twice daily--would not resume empagliflozin as this predisposes to urinary infections Hypothyroid Continue Synthroid 25 mcg daily and check a TSH in 3 to 4 weeks GI prophylaxis Continue Protonix 40 daily Prior cervicalgia Hold amitriptyline 12.5, Flexeril 10 Hyperlipidemia Continue Niaspan 1000 CR at bedtime atorvastatin 10   DVT/Heparin, full code, inpatient pending resolution multiple medical illnesses- Disposition expected discharge within 48 hours depending on therapy evaluation given critical illness for recommendations    Mary Flynn female DM TY 03/1993-prior Dr. Dwyane Dee Endo, HTN, HLD, hypothyroid,  cyclical cough Dr. Melvyn Novas 07/13/2018 + vocal cord degeneration/[facility] Dr. Luana Shu, cervicalgia Admit 05/30/2018 septic shock 2/2 urinary source-initially BiPAP, lactic acidosis 3.7, central line, Levophed-femoral catheter placed felt to have hypoxic respiratory failure--CTA ruled out PE TTE elevated PASP Creatinine close to baseline 1.4 sinus tach-placed on meropenem Daughter Mary Flynn [phone]   Quite talkative and somewhat tangential seems to focus on diet No reported chest pain Some unfounded believes that drinking cold fluids makes her shortness of breath worse-I tried to explain to her in layman's terms that that is not the case Otherwise no new issues today she feels overall much better  O BP 112/72   Pulse (!) 110   Temp 99.1 F (37.3 C) (Oral)   Resp (!) 23   Ht 4\' 10"  (1.473 m)   Wt 65 kg   SpO2 95%   BMI 29.95 kg/m    Labs/studies reviewed today  Potassium 3.5-->3.3 BUN/creatinine 32/1.3-->10/0.6 Magnesium 1.6 WBC 21.7-->11.3 platelet 238 CBG trend 10 3-1 71  Awake alert coherent quite garulous-no distress  s1 s2 tachycardia no m/r/g abd soft nt dn no rebound No Le edema cta b no added sound Neuro intact power 5/5 no unilat weak

## 2019-06-04 ENCOUNTER — Inpatient Hospital Stay (HOSPITAL_COMMUNITY): Payer: Medicare Other

## 2019-06-04 DIAGNOSIS — A419 Sepsis, unspecified organism: Secondary | ICD-10-CM | POA: Diagnosis not present

## 2019-06-04 DIAGNOSIS — R6521 Severe sepsis with septic shock: Secondary | ICD-10-CM | POA: Diagnosis not present

## 2019-06-04 LAB — COMPREHENSIVE METABOLIC PANEL
ALT: 52 U/L — ABNORMAL HIGH (ref 0–44)
AST: 34 U/L (ref 15–41)
Albumin: 2.2 g/dL — ABNORMAL LOW (ref 3.5–5.0)
Alkaline Phosphatase: 79 U/L (ref 38–126)
Anion gap: 9 (ref 5–15)
BUN: 9 mg/dL (ref 8–23)
CO2: 24 mmol/L (ref 22–32)
Calcium: 8 mg/dL — ABNORMAL LOW (ref 8.9–10.3)
Chloride: 106 mmol/L (ref 98–111)
Creatinine, Ser: 0.7 mg/dL (ref 0.44–1.00)
GFR calc Af Amer: >60 mL/min (ref >60)
GFR calc non Af Amer: >60 mL/min (ref >60)
Glucose, Bld: 160 mg/dL — ABNORMAL HIGH (ref 70–99)
Potassium: 4.4 mmol/L (ref 3.5–5.1)
Sodium: 139 mmol/L (ref 135–145)
Total Bilirubin: 0.9 mg/dL (ref 0.3–1.2)
Total Protein: 5.3 g/dL — ABNORMAL LOW (ref 6.5–8.1)

## 2019-06-04 LAB — CBC WITH DIFFERENTIAL/PLATELET
Abs Immature Granulocytes: 0.4 10*3/uL — ABNORMAL HIGH (ref 0.00–0.07)
Basophils Absolute: 0 10*3/uL (ref 0.0–0.1)
Basophils Relative: 0 %
Eosinophils Absolute: 0.3 10*3/uL (ref 0.0–0.5)
Eosinophils Relative: 3 %
HCT: 31.1 % — ABNORMAL LOW (ref 36.0–46.0)
Hemoglobin: 9.7 g/dL — ABNORMAL LOW (ref 12.0–15.0)
Immature Granulocytes: 4 %
Lymphocytes Relative: 17 %
Lymphs Abs: 1.7 10*3/uL (ref 0.7–4.0)
MCH: 22.7 pg — ABNORMAL LOW (ref 26.0–34.0)
MCHC: 31.2 g/dL (ref 30.0–36.0)
MCV: 72.8 fL — ABNORMAL LOW (ref 80.0–100.0)
Monocytes Absolute: 1 10*3/uL (ref 0.1–1.0)
Monocytes Relative: 10 %
Neutro Abs: 6.7 10*3/uL (ref 1.7–7.7)
Neutrophils Relative %: 66 %
Platelets: 277 10*3/uL (ref 150–400)
RBC: 4.27 MIL/uL (ref 3.87–5.11)
RDW: 19.9 % — ABNORMAL HIGH (ref 11.5–15.5)
WBC: 10.1 10*3/uL (ref 4.0–10.5)
nRBC: 0.2 % (ref 0.0–0.2)

## 2019-06-04 LAB — GLUCOSE, CAPILLARY
Glucose-Capillary: 191 mg/dL — ABNORMAL HIGH (ref 70–99)
Glucose-Capillary: 172 mg/dL — ABNORMAL HIGH (ref 70–99)
Glucose-Capillary: 140 mg/dL — ABNORMAL HIGH (ref 70–99)
Glucose-Capillary: 126 mg/dL — ABNORMAL HIGH (ref 70–99)

## 2019-06-04 LAB — MAGNESIUM: Magnesium: 1.7 mg/dL (ref 1.7–2.4)

## 2019-06-04 MED ORDER — SODIUM CHLORIDE 0.9 % IV SOLN
1.0000 g | Freq: Two times a day (BID) | INTRAVENOUS | Status: AC
  Administered 2019-06-04 (×2): 1 g via INTRAVENOUS
  Filled 2019-06-04 (×2): qty 1

## 2019-06-04 MED ORDER — FUROSEMIDE 40 MG PO TABS
40.0000 mg | ORAL_TABLET | Freq: Every day | ORAL | Status: DC
  Administered 2019-06-04 – 2019-06-07 (×4): 40 mg via ORAL
  Filled 2019-06-04 (×4): qty 1

## 2019-06-04 NOTE — Plan of Care (Signed)

## 2019-06-04 NOTE — Progress Notes (Signed)
Assesment/PLAN Septic shock secondary to ESBL E. Coli-resolved Meropenem 5 day stopped 06/04/2019 Continue low-dose metoprolol twice daily-graduate if possible Transfer to telemetry whenever available TY 2 demand ischemia 2/2 severe sepsis Echo 4/7 EF 70-75% hyperdynamic RVP 15, RVSP 62-needs control of pressures-metoprolol as above- euvolemic at this time. Hypoxic respiratory failure superimposed on cyclical cough vocal cord degeneration probable bronchiectasis and component of pulmonary hypertension?  Component ALI 2/2 sepsis? Continue to attempt to de-escalate O2 flow oxygen levels--CXR 4/11 2 VW = my read = atelectasis right side poor inspiratory effort Might need Pulmonary to revisit?  Dose lasix 40qd po --revisit imaging if no better AKI SUPERIMPOSED ON CKD 3 Estimated Creatinine Clearance: 41.4 mL/min (by C-G formula based on SCr of 0.7 mg/dL). Hypokalemia/hypomagnesemia AKI better with resolved sepsis physiology DM ty ii glycemia well controlled-continue Levemir 12, SSI 4 units added to sliding scale Reimplement gabapentin lower dose 100 twice daily Amaryl 4 every morning p.m. Metformin resumed at lower dose 500 twice daily --would not resume empagliflozin as this predisposes to urinary infections Hypothyroid Continue Synthroid 25 mcg daily and check a TSH in 3 to 4 weeks GI prophylaxis Continue Protonix 40 daily Prior cervicalgia Restarts slowly amitriptyline 12.5, Flexeril 10--on beers criteria may not resume at all on d/c Hyperlipidemia DC Niaspan 1000 CR --resume on d/c atorvastatin 10   DVT/Heparin, full code, inpatient pending resolution multiple medical illnesses Discussed with daughter in person, called sister who is a physician and updated in addition Disposition dependant on ambulation and oxygenation--needs some furthe resolution    S-84 [place] female DM TY 03/1993-prior Dr. Dwyane Dee Endo, HTN, HLD, hypothyroid, cyclical cough Dr. Melvyn Novas 07/13/2018 + vocal cord  degeneration/[facility] Dr. Luana Shu, cervicalgia [place] 05/30/2018 septic shock 2/2 urinary source-initially BiPAP, lactic acidosis 3.7, central line, Levophed-femoral catheter placed felt to have hypoxic respiratory failure--CTA ruled out PE TTE elevated PASP Creatinine close to baseline 1.4 sinus tach-placed on meropenem  daughter Murlean Iba [phone]  Pleasant however needed?  15 L high flow last night-currently now down to 8 L-seems to desat at night Up in chair today in good spirits received vegetarian food and is happy No chills no Reiger no cough no cold no cough with diet observed by nursing  O BP 128/67 (BP Location: Left Arm)   Pulse (!) 106   Temp 98.4 F (36.9 C) (Oral)   Resp (!) 32   Ht 4\' 10"  (1.473 m)   Wt 59.6 kg   SpO2 93%   BMI 27.46 kg/m  Alert no icterus no pallor pleasant no wasting No JVD Tachycardic low 100 range CTA B no rales no rhonchi no fremitus no resonance no TVR Abdomen soft nontender No lower extremity edema  Labs/studies reviewed today  Potassium 3.5-->4.4 BUN/creatinine 32/1.3-->10/0.6-->9/0.7 Magnesium 1.6->1.7 WBC 21.7-->10.1 platelet 277 CBG trend 150-220

## 2019-06-05 ENCOUNTER — Inpatient Hospital Stay (HOSPITAL_COMMUNITY): Payer: Medicare Other

## 2019-06-05 DIAGNOSIS — A419 Sepsis, unspecified organism: Secondary | ICD-10-CM | POA: Diagnosis not present

## 2019-06-05 DIAGNOSIS — R6521 Severe sepsis with septic shock: Secondary | ICD-10-CM | POA: Diagnosis not present

## 2019-06-05 LAB — CBC WITH DIFFERENTIAL/PLATELET
Abs Immature Granulocytes: 0.41 10*3/uL — ABNORMAL HIGH (ref 0.00–0.07)
Basophils Absolute: 0 10*3/uL (ref 0.0–0.1)
Basophils Relative: 0 %
Eosinophils Absolute: 0.5 10*3/uL (ref 0.0–0.5)
Eosinophils Relative: 5 %
HCT: 32.6 % — ABNORMAL LOW (ref 36.0–46.0)
Hemoglobin: 10.2 g/dL — ABNORMAL LOW (ref 12.0–15.0)
Immature Granulocytes: 4 %
Lymphocytes Relative: 21 %
Lymphs Abs: 2.1 10*3/uL (ref 0.7–4.0)
MCH: 22.8 pg — ABNORMAL LOW (ref 26.0–34.0)
MCHC: 31.3 g/dL (ref 30.0–36.0)
MCV: 72.8 fL — ABNORMAL LOW (ref 80.0–100.0)
Monocytes Absolute: 0.9 10*3/uL (ref 0.1–1.0)
Monocytes Relative: 9 %
Neutro Abs: 6.1 10*3/uL (ref 1.7–7.7)
Neutrophils Relative %: 61 %
Platelets: 353 10*3/uL (ref 150–400)
RBC: 4.48 MIL/uL (ref 3.87–5.11)
RDW: 20.8 % — ABNORMAL HIGH (ref 11.5–15.5)
WBC: 10.1 10*3/uL (ref 4.0–10.5)
nRBC: 0 % (ref 0.0–0.2)

## 2019-06-05 LAB — GLUCOSE, CAPILLARY
Glucose-Capillary: 163 mg/dL — ABNORMAL HIGH (ref 70–99)
Glucose-Capillary: 103 mg/dL — ABNORMAL HIGH (ref 70–99)
Glucose-Capillary: 283 mg/dL — ABNORMAL HIGH (ref 70–99)

## 2019-06-05 LAB — COMPREHENSIVE METABOLIC PANEL
ALT: 43 U/L (ref 0–44)
AST: 33 U/L (ref 15–41)
Albumin: 2.4 g/dL — ABNORMAL LOW (ref 3.5–5.0)
Alkaline Phosphatase: 83 U/L (ref 38–126)
Anion gap: 12 (ref 5–15)
BUN: 9 mg/dL (ref 8–23)
CO2: 23 mmol/L (ref 22–32)
Calcium: 8.5 mg/dL — ABNORMAL LOW (ref 8.9–10.3)
Chloride: 102 mmol/L (ref 98–111)
Creatinine, Ser: 0.74 mg/dL (ref 0.44–1.00)
GFR calc Af Amer: >60 mL/min (ref >60)
GFR calc non Af Amer: >60 mL/min (ref >60)
Glucose, Bld: 184 mg/dL — ABNORMAL HIGH (ref 70–99)
Potassium: 4.3 mmol/L (ref 3.5–5.1)
Sodium: 137 mmol/L (ref 135–145)
Total Bilirubin: 1.6 mg/dL — ABNORMAL HIGH (ref 0.3–1.2)
Total Protein: 6 g/dL — ABNORMAL LOW (ref 6.5–8.1)

## 2019-06-05 MED ORDER — SODIUM CHLORIDE 0.9 % IV SOLN
1.0000 g | Freq: Two times a day (BID) | INTRAVENOUS | Status: AC
  Administered 2019-06-05 (×2): 1 g via INTRAVENOUS
  Filled 2019-06-05 (×2): qty 1

## 2019-06-05 MED ORDER — METOPROLOL TARTRATE 25 MG PO TABS
25.0000 mg | ORAL_TABLET | Freq: Two times a day (BID) | ORAL | Status: DC
  Administered 2019-06-05 – 2019-06-12 (×15): 25 mg via ORAL
  Filled 2019-06-05 (×15): qty 1

## 2019-06-05 NOTE — Progress Notes (Signed)
Physical Therapy Treatment Patient Details Name: Mary Flynn MRN: [id number] DOB: [birth year] Today's Date: 06/05/2019    History of Present Illness pt is an 85 y/o female with h/o DM and HTN with recent dx of UTI.  She apparently misunderstood when to take the Cipro and by day of admission had AMS.  Work up for worsening UTI/sepsis and acute hypoxic respiratory failure.    PT Comments    Pt very pleasant and reports being happy with care and feeling better. Pt able to progress mobility this session to include gait but continues to require 6L for activity with periods of desaturation and need for cues to monitor and adjust activity appropriately. Pt eager to progress activity for return home. Pt encouraged to increase mobility and use BSC or toilet with nursing. Will continue to follow.   HR 105-125 SpO2 87-95% 6L    Follow Up Recommendations  Home health PT;Supervision/Assistance - 24 hour;Other (comment)     Equipment Recommendations  Rolling walker with 5" wheels    Recommendations for Other Services OT consult     Precautions / Restrictions Precautions Precautions: Fall Precaution Comments: incontinent    Mobility  Bed Mobility Overal bed mobility: Modified Independent Bed Mobility: Supine to Sit           General bed mobility comments: HOb 30 degrees with use of rail  Transfers Overall transfer level: Needs assistance   Transfers: Sit to/from Stand;Stand Pivot Transfers Sit to Stand: Supervision Stand pivot transfers: Supervision       General transfer comment: cues for hand placement  Ambulation/Gait Ambulation/Gait assistance: Min guard Gait Distance (Feet): 200 Feet Assistive device: Rolling walker (2 wheeled) Gait Pattern/deviations: Step-through pattern;Decreased stride length   Gait velocity interpretation: >2.62 ft/sec, indicative of community ambulatory General Gait Details: cues for posture and position in RW. Pt agreeable to RW use and reports  she has cane at baseline. SpO2 87-95% on 6L with cues for breathing technique and not to talk and walk, Hr 105-125   Stairs             Wheelchair Mobility    Modified Rankin (Stroke Patients Only)       Balance Overall balance assessment: Needs assistance   Sitting balance-[name]: Good     Standing balance support: No upper extremity supported Standing balance-[name]: Fair                              Cognition Arousal/Alertness: Awake/alert Behavior During Therapy: WFL for tasks assessed/performed Overall Cognitive Status: Impaired/Different from baseline Area of Impairment: Safety/judgement                         Safety/Judgement: Decreased awareness of safety     General Comments: decreased awareness of purewick and incontinence      Exercises General Exercises - Lower Extremity Long Arc Quad: AROM;Both;Seated;10 reps    General Comments        Pertinent Vitals/Pain Pain Assessment: No/denies pain    Home Living                      Prior Function            PT Goals (current goals can now be found in the care plan section) Progress towards PT goals: Progressing toward goals    Frequency           PT  Plan Current plan remains appropriate    Co-evaluation              AM-PAC PT "6 Clicks" Mobility   Outcome Measure  Help needed turning from your back to your side while in a flat bed without using bedrails?: A Little Help needed moving from lying on your back to sitting on the side of a flat bed without using bedrails?: A Little Help needed moving to and from a bed to a chair (including a wheelchair)?: A Little Help needed standing up from a chair using your arms (e.g., wheelchair or bedside chair)?: A Little Help needed to walk in hospital room?: A Little Help needed climbing 3-5 steps with a railing? : A Little 6 Click Score: 18    End of Session Equipment Utilized During  Treatment: Oxygen Activity Tolerance: Patient tolerated treatment well Patient left: in chair;with call bell/phone within reach Nurse Communication: Mobility status;Precautions PT Visit Diagnosis: Other abnormalities of gait and mobility (R26.89);Difficulty in walking, not elsewhere classified (R26.2)     Time: [id number] PT Time Calculation (min) (ACUTE ONLY): 30 min  Charges:  $Gait Training: 8-22 mins $Therapeutic Activity: 8-22 mins                     Oland Arquette P, PT Acute Rehabilitation Services Pager: [phone] Office: [place] 06/05/2019, 12:54 PM

## 2019-06-05 NOTE — Progress Notes (Addendum)
Assesment/PLAN Septic shock secondary to ESBL E. Coli-resolved Meropenem extended to complete 7 days 2/2 bacteremia Tachycardia NOS Increase metoprolol to 25 twice daily 4/11 Monitor trends on telemetry TY 2 demand ischemia 2/2 severe sepsis Echo 4/7 EF 70-75% hyperdynamic RVP 15, RVSP 62-needs control of pressures-metoprolol as above Hypoxic respiratory failure superimposed on cyclical cough vocal cord degeneration probable bronchiectasis and component of pulmonary hypertension?  O2 sat as high as 15 L 4/10-currently 6 L O2 flow oxygen levels--CXR 4/12-my read better aeration-I feel in addition that she does not have any actual infection in the lung and that this is fluid and she had a malrotated and atelectatic x-ray on 4/11-after putting out 1.7 L overnight her O2 level requirement has decreased significantly Continue lasix 40qd po-maintain negative status and reassess AKI SUPERIMPOSED ON CKD 3 Estimated Creatinine Clearance: 41.1 mL/min (by C-G formula based on SCr of 0.74 mg/dL). Hypokalemia/hypomagnesemia AKI much improved-creatinine trending down well DM ty ii glycemia well controlled-continue Levemir 12, SSI 4 units added to sliding scale CBG 120 through 200 Continue gabapentin lower dose 100 twice daily Amaryl 4 every morning p.m. Metformin resumed at lower dose 500 twice daily --would not resume empagliflozin as this predisposes to urinary infections Hypothyroid Continue Synthroid 25 mcg daily and check a TSH in 3 to 4 weeks GI prophylaxis Continue Protonix 40 daily Prior cervicalgia On discharge discontinue amitriptyline 12.5, Flexeril 10--on beers criteria Hyperlipidemia DC Niaspan 1000 CR --resume on d/c atorvastatin 10   DVT/Heparin, full code, inpatient pending resolution multiple medical illnesses called daughter 4/12 at [phone], no answer Disposition dependant on ambulation and oxygenation--needs some further resolution Expect the same within the next 48  hours  S-84 [place] female DM TY 03/1993-prior Dr. Dwyane Dee Endo, HTN, HLD, hypothyroid, cyclical cough Dr. Melvyn Novas 07/13/2018 + vocal cord degeneration/[facility] Dr. Luana Shu, cervicalgia Admit 05/30/2018 septic shock 2/2 urinary source-initially BiPAP, lactic acidosis 3.7, central line, Levophed-femoral catheter placed felt to have hypoxic respiratory failure--CTA ruled out PE TTE elevated PASP Creatinine close to baseline 1.4 sinus tach-placed on meropenem  daughter Murlean Iba [phone]  [phone]  Weaned down to 6 L nasal cannula-complains of passing urine several times at night No fever chills cough or cold  O BP 131/66 (BP Location: Right Arm)   Pulse (!) 109   Temp (!) 97.1 F (36.2 C) (Oral)   Resp (!) 27   Ht 4\' 10"  (1.473 m)   Wt 58.6 kg   SpO2 90%   BMI 27.00 kg/m  Alert no icterus no pallor pleasant no wasting No JVD Tachycardic low 100 range CTA B no rales no rhonchi no fremitus no resonance no TVR Abdomen soft nontender No lower extremity edema  Labs/studies reviewed today  Potassium 3.5-->4.4-->4.3 BUN/creatinine 32/1.3-->10/0.6-->9/0.7-->9/0.7 WBC 21.7-->10.1platelet 277 CBG trend 150-220  I/0: -1.7, weight 65-->58.6

## 2019-06-05 NOTE — Progress Notes (Signed)
Pt has received 6d of Merrem for her ESBL e.coli bacteremia from the urinary source. D/w Dr. Verlon Au, we will continue it for a total of 7d.  Onnie Boer, PharmD, BCIDP, AAHIVP, CPP Infectious Disease Pharmacist 06/05/2019 8:51 AM

## 2019-06-05 NOTE — Evaluation (Signed)
Occupational Therapy Evaluation Patient Details Name: Mary Flynn MRN: [id number] DOB: 1935-02-06 Today's Date: 06/05/2019    History of Present Illness Pt is an 85 y/o female with h/o DM and HTN with recent dx of UTI.  She apparently misunderstood when to take the Cipro and by day of admission had AMS.  Work up for worsening UTI/sepsis and acute hypoxic respiratory failure.   Clinical Impression   Pt PTA: Pt living with spouse and reports independence. Pt currently minguardA overall level for ADL and requiring increased rest breaks, but able to perform on her own. Pt mobilizing well in room with no AD, minguardA. Pt with incontinence and puzzling to pt as this is new s/p UTI. Pt requiring 6L O2 to remain >88-90% O2 with exertion. HR up to 125 BPM. Pt requires continued OT skilled services for energy conservation and activity tolerance for ADL. OT following acutely.      Follow Up Recommendations  Home health OT(pt may increase to no OT follow-up )    Equipment Recommendations  None recommended by OT    Recommendations for Other Services       Precautions / Restrictions Precautions Precautions: Fall Precaution Comments: incontinent Restrictions Weight Bearing Restrictions: No      Mobility Bed Mobility Overal bed mobility: Modified Independent Bed Mobility: Supine to Sit     Supine to sit: Min guard     General bed mobility comments: reaching for OTR's hand, but minguardA for trunk elevation  Transfers Overall transfer level: Needs assistance   Transfers: Sit to/from Stand;Stand Pivot Transfers Sit to Stand: Supervision Stand pivot transfers: Min guard       General transfer comment: cues for hand placement    Balance Overall balance assessment: Needs assistance   Sitting balance-[name]: Good     Standing balance support: No upper extremity supported Standing balance-[name]: Fair                             ADL either performed or  assessed with clinical judgement   ADL Overall ADL's : Needs assistance/impaired Eating/Feeding: Modified independent   Grooming: Supervision/safety;Standing   Upper Body Bathing: Supervision/ safety;Standing   Lower Body Bathing: Min guard;Sitting/lateral leans;Sit to/from stand Lower Body Bathing Details (indicate cue type and reason): fixing sock, standing and leaning on bed using figure 4 technique Upper Body Dressing : Supervision/safety;Sitting   Lower Body Dressing: Min guard;Sitting/lateral leans;Sit to/from stand   Toilet Transfer: Min guard;Ambulation   Toileting- Clothing Manipulation and Hygiene: Min guard;Sitting/lateral lean;Sit to/from stand;Cueing for safety       Functional mobility during ADLs: Min guard;Cueing for safety General ADL Comments: Pt supervisionA level for ADL and requiring increased rest breaks, but able to perform on her own.     Vision Baseline Vision/History: No visual deficits Patient Visual Report: No change from baseline Vision Assessment?: No apparent visual deficits     Perception     Praxis      Pertinent Vitals/Pain Pain Assessment: No/denies pain     Hand Dominance Right   Extremity/Trunk Assessment Upper Extremity Assessment Upper Extremity Assessment: Overall WFL for tasks assessed   Lower Extremity Assessment Lower Extremity Assessment: Overall WFL for tasks assessed;Defer to PT evaluation   Cervical / Trunk Assessment Cervical / Trunk Assessment: Normal   Communication Communication Communication: No difficulties   Cognition Arousal/Alertness: Awake/alert Behavior During Therapy: WFL for tasks assessed/performed Overall Cognitive Status: Impaired/Different from baseline Area of Impairment:  Safety/judgement                         Safety/Judgement: Decreased awareness of safety     General Comments: Pt reporting "sometimes I feel it coming and sometimes I do not" referring to incontinence.    General Comments  Pt requiring 6L O2 to remain >88-90% O2 with exertion. HR up to 125 BPM.    Exercises General Exercises - Lower Extremity Long Arc Quad: AROM;Both;Seated;10 reps   Shoulder Instructions      Home Living Family/patient expects to be discharged to:: Private residence Living Arrangements: Spouse/significant other Available Help at Discharge: Available 24 hours/day Type of Home: Apartment Home Access: Level entry     Home Layout: One level     Bathroom Shower/Tub: Teacher, early years/pre: Standard     Home Equipment: Tub bench;Shower seat          Prior Functioning/Environment Level of Independence: Independent        Comments: Living with spouse        OT Problem List: Decreased strength;Decreased activity tolerance;Impaired balance (sitting and/or standing);Decreased safety awareness;Decreased knowledge of use of DME or AE;Decreased cognition      OT Treatment/Interventions: Self-care/ADL training;Therapeutic exercise;Energy conservation;DME and/or AE instruction;Therapeutic activities;Cognitive remediation/compensation;Patient/family education;Balance training    OT Goals(Current goals can be found in the care plan section) Acute Rehab OT Goals Patient Stated Goal: back home OT Goal Formulation: With patient Time For Goal Achievement: 06/19/19 Potential to Achieve Goals: Good ADL Goals Additional ADL Goal #1: Pt will participate in 15 mins of ADL routine with modified independence with 2 seated rest breaks in order to increase activity tolerance. Additional ADL Goal #2: Pt will perform ADL task/ADL functional mobility with >90% O2 requiring <2 seated rest breaks throughout tasks.  OT Frequency: Min 2X/week   Barriers to D/C:            Co-evaluation              AM-PAC OT "6 Clicks" Daily Activity     Outcome Measure Help from another person eating meals?: None Help from another person taking care of personal grooming?:  A Little Help from another person toileting, which includes using toliet, bedpan, or urinal?: None Help from another person bathing (including washing, rinsing, drying)?: None Help from another person to put on and taking off regular upper body clothing?: None Help from another person to put on and taking off regular lower body clothing?: A Little 6 Click Score: 22   End of Session Equipment Utilized During Treatment: Gait belt;Oxygen Nurse Communication: Mobility status  Activity Tolerance: Patient tolerated treatment well Patient left: in bed;with call bell/phone within reach;with bed alarm set  OT Visit Diagnosis: Unsteadiness on feet (R26.81);Muscle weakness (generalized) (M62.81)                Time: [id number] OT Time Calculation (min): 36 min Charges:  OT General Charges $OT Visit: 1 Visit OT Evaluation $OT Eval Moderate Complexity: 1 Mod OT Treatments $Self Care/Home Management : 8-22 mins  [name], OTR/L Acute Rehabilitation Services Pager: [phone] Office: [phone]   Dorleen Kissel  C 06/05/2019, 3:19 PM

## 2019-06-06 ENCOUNTER — Inpatient Hospital Stay (HOSPITAL_COMMUNITY): Payer: Medicare Other

## 2019-06-06 DIAGNOSIS — R6521 Severe sepsis with septic shock: Secondary | ICD-10-CM | POA: Diagnosis not present

## 2019-06-06 DIAGNOSIS — A419 Sepsis, unspecified organism: Secondary | ICD-10-CM | POA: Diagnosis not present

## 2019-06-06 LAB — CBC WITH DIFFERENTIAL/PLATELET
Abs Immature Granulocytes: 0.51 10*3/uL — ABNORMAL HIGH (ref 0.00–0.07)
Basophils Absolute: 0.1 10*3/uL (ref 0.0–0.1)
Basophils Relative: 0 %
Eosinophils Absolute: 0.4 10*3/uL (ref 0.0–0.5)
Eosinophils Relative: 3 %
HCT: 34.3 % — ABNORMAL LOW (ref 36.0–46.0)
Hemoglobin: 10.7 g/dL — ABNORMAL LOW (ref 12.0–15.0)
Immature Granulocytes: 4 %
Lymphocytes Relative: 15 %
Lymphs Abs: 2 10*3/uL (ref 0.7–4.0)
MCH: 23.1 pg — ABNORMAL LOW (ref 26.0–34.0)
MCHC: 31.2 g/dL (ref 30.0–36.0)
MCV: 73.9 fL — ABNORMAL LOW (ref 80.0–100.0)
Monocytes Absolute: 1.1 10*3/uL — ABNORMAL HIGH (ref 0.1–1.0)
Monocytes Relative: 8 %
Neutro Abs: 9.3 10*3/uL — ABNORMAL HIGH (ref 1.7–7.7)
Neutrophils Relative %: 70 %
Platelets: 409 10*3/uL — ABNORMAL HIGH (ref 150–400)
RBC: 4.64 MIL/uL (ref 3.87–5.11)
RDW: 21.2 % — ABNORMAL HIGH (ref 11.5–15.5)
WBC: 13.3 10*3/uL — ABNORMAL HIGH (ref 4.0–10.5)
nRBC: 0.2 % (ref 0.0–0.2)

## 2019-06-06 LAB — GLUCOSE, CAPILLARY
Glucose-Capillary: 165 mg/dL — ABNORMAL HIGH (ref 70–99)
Glucose-Capillary: 133 mg/dL — ABNORMAL HIGH (ref 70–99)
Glucose-Capillary: 202 mg/dL — ABNORMAL HIGH (ref 70–99)
Glucose-Capillary: 125 mg/dL — ABNORMAL HIGH (ref 70–99)

## 2019-06-06 LAB — PROCALCITONIN: Procalcitonin: 1.42 ng/mL

## 2019-06-06 NOTE — Plan of Care (Signed)

## 2019-06-06 NOTE — Progress Notes (Addendum)
Assesment/PLAN  Septic shock secondary to ESBL E. Coli-resolved Meropenem extended to complete 7 days 2/2 bacteremia Watch for fever-but wouldn't extedn beyond what has already given ?  C. difficile colitis Procalcitonin 1.4 White count 13 CXR fluid>infection Risk factor age, PPI, antibiotics-last dose colace close to 48 hr ago complete Invanz, await PCR stop Metformin which can cause diarrhea Tachycardia NOS Increase metoprolol to 25 twice daily 4/11 Monitor trends on telemetry TY 2 demand ischemia 2/2 severe sepsis Echo 4/7 EF 70-75% hyperdynamic RVP 15, RVSP 62-needs control of pressures-metoprolol as above Hypoxic respiratory failure superimposed on cyclical cough vocal cord degeneration probable bronchiectasis and component of pulmonary hypertension?  O2 sat as high as 15 L 4/10-currently 4 L O2 flow oxygen levels--CXR 4/12-my read better aeration-CXR repeat 4/13 my read shows bilateral airspace disease but more favoring fluid >pneumonia--if there is any furthe rocncern-get CT chest.  Wouldnt if she is better Continue lasix 40qd but monitor labs carefully given diarrhea AKI SUPERIMPOSED ON CKD 3 Estimated Creatinine Clearance: 40.8 mL/min (by C-G formula based on SCr of 0.74 mg/dL). Hypokalemia/hypomagnesemia AKI much improved-creatinine trending down well DM ty ii glycemia well controlled-continue Levemir 12, SSI 4 units added to sliding scale CBG 133-200 gabapentin lower dose 100 twice daily Amaryl 4 every morning p.m. Metformin stopped 4/13 --would not resume empagliflozin as this predisposes to urinary infections Hypothyroid Continue Synthroid 25 mcg daily and check a TSH in 3 to 4 weeks GI prophylaxis Hold Protonix at this time Prior cervicalgia On discharge discontinue amitriptyline 12.5, Flexeril 10--on beers criteria Hyperlipidemia DC Niaspan 1000 CR --resume on d/c atorvastatin 10   DVT/Heparin, full code, inpatient pending resolution multiple medical  illnesses called daughter 4/12 at [phone]  updated Disposition dependant on ambulation and oxygenation--needs some further resolution Expect the same within the next 48 hours--await Cdiff, and can probably d/c in the next day or so  S-84 [place] female DM TY 03/1993-prior Dr. Dwyane Dee Endo, HTN, HLD, hypothyroid, cyclical cough Dr. Melvyn Novas 07/13/2018 + vocal cord degeneration/[facility] Dr. Luana Shu, cervicalgia Admit 05/30/2018 septic shock 2/2 urinary source-initially BiPAP, lactic acidosis 3.7, central line, Levophed-femoral catheter placed felt to have hypoxic respiratory failure--CTA ruled out PE TTE elevated PASP Creatinine close to baseline 1.4 sinus tach-placed on meropenem  daughter Murlean Iba [phone]  Alert was sleeping when I saw her but awakens easily able to sit up on side of the bed Weaned down to 6 L nasal cannula-complains of passing urine several times at night-had 2 loose stools and nurse informs me that subsequently has had a third Afebrile   O BP 120/74 (BP Location: Left Arm)   Pulse 100   Temp 98.3 F (36.8 C) (Oral)   Resp 19   Ht 4\' 10"  (1.473 m)   Wt 58 kg   SpO2 98%   BMI 26.72 kg/m   No JVD Tachycardic low 100 range CTA B no rales no rhonchi no fremitus no resonance no TVR ABD soft nontender no rebound No lower extremity edema power 5/5 no focal deficit   Labs/studies reviewed today  Potassium 3.5-->4.4-->4.3 BUN/creatinine 32/1.3-->10/0.6-->9/0.7-->9/0.7 WBC 21.7-->10.1-->13 Procalcitonin 1.4 Platelet 277 CBG trend 133-202  I/0: -1.8, weight 65-->58 kg

## 2019-06-07 DIAGNOSIS — I272 Pulmonary hypertension, unspecified: Secondary | ICD-10-CM

## 2019-06-07 DIAGNOSIS — B9620 Unspecified Escherichia coli [E. coli] as the cause of diseases classified elsewhere: Secondary | ICD-10-CM | POA: Diagnosis present

## 2019-06-07 DIAGNOSIS — E118 Type 2 diabetes mellitus with unspecified complications: Secondary | ICD-10-CM

## 2019-06-07 DIAGNOSIS — A419 Sepsis, unspecified organism: Secondary | ICD-10-CM | POA: Diagnosis not present

## 2019-06-07 DIAGNOSIS — N3000 Acute cystitis without hematuria: Secondary | ICD-10-CM | POA: Diagnosis not present

## 2019-06-07 DIAGNOSIS — R0902 Hypoxemia: Secondary | ICD-10-CM

## 2019-06-07 DIAGNOSIS — N179 Acute kidney failure, unspecified: Secondary | ICD-10-CM

## 2019-06-07 DIAGNOSIS — I248 Other forms of acute ischemic heart disease: Secondary | ICD-10-CM

## 2019-06-07 DIAGNOSIS — R7881 Bacteremia: Secondary | ICD-10-CM | POA: Diagnosis not present

## 2019-06-07 DIAGNOSIS — E1165 Type 2 diabetes mellitus with hyperglycemia: Secondary | ICD-10-CM

## 2019-06-07 LAB — CBC WITH DIFFERENTIAL/PLATELET
Abs Immature Granulocytes: 0.26 10*3/uL — ABNORMAL HIGH (ref 0.00–0.07)
Basophils Absolute: 0.1 10*3/uL (ref 0.0–0.1)
Basophils Relative: 0 %
Eosinophils Absolute: 0.3 10*3/uL (ref 0.0–0.5)
Eosinophils Relative: 2 %
HCT: 34.4 % — ABNORMAL LOW (ref 36.0–46.0)
Hemoglobin: 10.6 g/dL — ABNORMAL LOW (ref 12.0–15.0)
Immature Granulocytes: 2 %
Lymphocytes Relative: 13 %
Lymphs Abs: 1.5 10*3/uL (ref 0.7–4.0)
MCH: 23.3 pg — ABNORMAL LOW (ref 26.0–34.0)
MCHC: 30.8 g/dL (ref 30.0–36.0)
MCV: 75.6 fL — ABNORMAL LOW (ref 80.0–100.0)
Monocytes Absolute: 0.8 10*3/uL (ref 0.1–1.0)
Monocytes Relative: 7 %
Neutro Abs: 8.9 10*3/uL — ABNORMAL HIGH (ref 1.7–7.7)
Neutrophils Relative %: 76 %
Platelets: 450 10*3/uL — ABNORMAL HIGH (ref 150–400)
RBC: 4.55 MIL/uL (ref 3.87–5.11)
RDW: 21.6 % — ABNORMAL HIGH (ref 11.5–15.5)
WBC: 11.7 10*3/uL — ABNORMAL HIGH (ref 4.0–10.5)
nRBC: 0 % (ref 0.0–0.2)

## 2019-06-07 LAB — COMPREHENSIVE METABOLIC PANEL
ALT: 40 U/L (ref 0–44)
AST: 36 U/L (ref 15–41)
Albumin: 2.6 g/dL — ABNORMAL LOW (ref 3.5–5.0)
Alkaline Phosphatase: 83 U/L (ref 38–126)
Anion gap: 11 (ref 5–15)
BUN: 11 mg/dL (ref 8–23)
CO2: 25 mmol/L (ref 22–32)
Calcium: 9.5 mg/dL (ref 8.9–10.3)
Chloride: 100 mmol/L (ref 98–111)
Creatinine, Ser: 0.75 mg/dL (ref 0.44–1.00)
GFR calc Af Amer: >60 mL/min (ref >60)
GFR calc non Af Amer: >60 mL/min (ref >60)
Glucose, Bld: 167 mg/dL — ABNORMAL HIGH (ref 70–99)
Potassium: 4.9 mmol/L (ref 3.5–5.1)
Sodium: 136 mmol/L (ref 135–145)
Total Bilirubin: 1.2 mg/dL (ref 0.3–1.2)
Total Protein: 6.1 g/dL — ABNORMAL LOW (ref 6.5–8.1)

## 2019-06-07 LAB — GLUCOSE, CAPILLARY
Glucose-Capillary: 174 mg/dL — ABNORMAL HIGH (ref 70–99)
Glucose-Capillary: 236 mg/dL — ABNORMAL HIGH (ref 70–99)
Glucose-Capillary: 146 mg/dL — ABNORMAL HIGH (ref 70–99)
Glucose-Capillary: 139 mg/dL — ABNORMAL HIGH (ref 70–99)

## 2019-06-07 LAB — C DIFFICILE QUICK SCREEN W PCR REFLEX
C Diff antigen: NEGATIVE
C Diff interpretation: NOT DETECTED
C Diff toxin: NEGATIVE

## 2019-06-07 MED ORDER — FUROSEMIDE 10 MG/ML IJ SOLN
40.0000 mg | Freq: Two times a day (BID) | INTRAMUSCULAR | Status: DC
  Administered 2019-06-07 – 2019-06-12 (×10): 40 mg via INTRAVENOUS
  Filled 2019-06-07 (×10): qty 4

## 2019-06-07 MED ORDER — MAGIC MOUTHWASH W/LIDOCAINE
5.0000 mL | Freq: Four times a day (QID) | ORAL | Status: DC | PRN
  Administered 2019-06-07 – 2019-06-09 (×2): 5 mL via ORAL
  Filled 2019-06-07 (×3): qty 5

## 2019-06-07 MED ORDER — ALBUMIN HUMAN 25 % IV SOLN
25.0000 g | Freq: Once | INTRAVENOUS | Status: AC
  Administered 2019-06-07: 25 g via INTRAVENOUS
  Filled 2019-06-07: qty 100

## 2019-06-07 NOTE — Plan of Care (Signed)

## 2019-06-07 NOTE — Plan of Care (Signed)
  Problem: Education: Goal: Knowledge of General Education information will improve Description Including pain rating scale, medication(s)/side effects and non-pharmacologic comfort measures Outcome: Progressing   Problem: Health Behavior/Discharge Planning: Goal: Ability to manage health-related needs will improve Outcome: Progressing   Problem: Clinical Measurements: Goal: Ability to maintain clinical measurements within normal limits will improve Outcome: Progressing Goal: Will remain free from infection Outcome: Progressing Goal: Diagnostic test results will improve Outcome: Progressing Goal: Respiratory complications will improve Outcome: Progressing Goal: Cardiovascular complication will be avoided Outcome: Progressing   Problem: Activity: Goal: Risk for activity intolerance will decrease Outcome: Progressing   Problem: Coping: Goal: Level of anxiety will decrease Outcome: Progressing   Problem: Pain Managment: Goal: General experience of comfort will improve Outcome: Progressing   

## 2019-06-07 NOTE — Progress Notes (Signed)
Pts daughter Murlean Iba would like to be contacted by the social worker today. Contact information located in chart.

## 2019-06-07 NOTE — Progress Notes (Signed)
PROGRESS NOTE    Mary Flynn  [id number] DOB: [birth year] DOA: 05/30/2019 PCP: [name], NP   Brief Narrative:  85 year old BF PMHx Depression, Memory loss, DM type II uncontrolled with complication, peripheral neuropathy, mixed dyslipidemia, subclinical hypothyroidism, RIGHT thyroid nodule, essential HTN, chronic pain syndrome (LEFT rib fracture, compression fracture L2)    Patient was recently seen and treated for acute cystitis through the family medicine office, had UTI, treated with ciprofloxacin.  Medication never started  According to the paramedics who transported the patient to the hospital she was found with a change in her mental status this morning when someone was talking to her on the phone.  This was a neighbor, when the paramedics arrived they found the patient to be hypoxic, tachycardic, ill-appearing slightly confused and had a temperature over 99.9, the activated a code sepsis prehospital.  No antibiotics were given, oxygen was supplied by nonrebreather.  Further review of the medical record shows that the patient had a change in her medication to nitrofurantoin yesterday.  The patient does not have any history of pulmonary embolism, however she is a diabetic taking multiple different diet medications including Jardiance and Metformin   Subjective: A/O x4, negative CP, positive S OB.  States never on home O2.   Assessment & Plan:   Active Problems:   Septic shock (HCC)   AKI (acute kidney injury) ([place])   Severe sepsis with septic shock (HCC)   Bacteremia due to Escherichia coli   Pulmonary hypertension (HCC)   Demand ischemia (HCC)   Diabetes mellitus type 2, uncontrolled, with complications (HCC)  Septic shock positive ESBL bacteremia -Completed 7-day course of Meropenem   Acute respiratory failure with hypoxia -Multifactorial pulmonary hypertension, pulmonary edema. -Treat underlying conditions. -Titrate to maintain SPO2>  94%   CHF/pulmonary HTN -Dry weight 132 pounds (59.9 kg) per Dr. Sunny Schlein Revankar note from 05/10/2019 -Echocardiogram 4/7 showed hyperdynamic EF with moderate pulmonary HTN see results below. -Strict in and out -Daily weight -Metoprolol 25 mg BID -4/14 Albumin 25 g x 1 -Lasix IV 40 mg BID  Tachycardia NOS -See CHF -Resolved   Demand ischemia secondary to severe sepsis -See echocardiogram below -Resolved  Acute kidney injury -Prior to admission normal creatinine -Continue to monitor creatinine level with diuresis  C. difficile colitis? -Patient now having episodes of diarrhea, C. difficile culture pending. -Metformin also discontinued as may be contributing to her diarrhea. -4/12 procalcitonin elevated= 1.42, will redraw in the A.m.  DM type II uncontrolled with complication -4/6 hemoglobin A1c= 8.2 -Levemir 12 units qhs -NovoLog 4 units qac -Moderate SSI  Hypothyroid Continue Synthroid 25 mcg daily and check a TSH in 3 to 4 weeks GI prophylaxis Hold Protonix at this time Prior cervicalgia On discharge discontinue amitriptyline 12.5, Flexeril 10--on beers criteria Hyperlipidemia DC Niaspan 1000 CR --resume on d/c atorvastatin 10     DVT prophylaxis: Heparin subcu Code Status: Full Family Communication: 4/14 daughter and husband at bedside discussed plan of care answered all questions Disposition Plan:  1.  Where the patient is from 2.  Anticipated d/c place. 3.  Barriers to d/c resolution pulmonary edema.  Most likely will require rehab   Consultants:  PCCM  Procedures/Significant Events:  4/7 Echocardiogram;Left Ventricle: LVEF= 70 to 75%. The left ventricle has hyperdynamic function.-no regional wall motion abnormalities. The left ventricular internal cavity size was normal in size.  -Moderate LVH Right Ventricle: Moderately elevated pulmonary artery systolic pressure.  estimated right ventricular systolic pressure is [id number] mmHg.  Venous:  The inferior  vena cava is dilated in size with less than 50% respiratory variability, suggesting right atrial pressure of 15 mmHg.     I have personally reviewed and interpreted all radiology studies and my findings are as above.  VENTILATOR SETTINGS:    Cultures 4/6 SARS coronavirus negative 4/6 influenza A/B negative 4/6 blood left AC positive E. coli 4/6 urine positive E. Coli 4/6 MRSA by PCR negative      Antimicrobials: Anti-infectives (From admission, onward)   Start     Ordered Stop   06/05/19 1000  meropenem (MERREM) 1 g in sodium chloride 0.9 % 100 mL IVPB     06/05/19 0849 06/05/19 2228   06/04/19 1000  meropenem (MERREM) 1 g in sodium chloride 0.9 % 100 mL IVPB     06/04/19 0713 06/04/19 2325   05/31/19 0200  meropenem (MERREM) 1 g in sodium chloride 0.9 % 100 mL IVPB  Status:  Discontinued     05/30/19 1252 06/04/19 0713   05/30/19 1300  meropenem (MERREM) 1 g in sodium chloride 0.9 % 100 mL IVPB     05/30/19 1251 05/30/19 1413   05/30/19 0945  levofloxacin (LEVAQUIN) IVPB 750 mg  Status:  Discontinued     05/30/19 0933 05/30/19 0937   05/30/19 0945  cefTRIAXone (ROCEPHIN) 1 g in sodium chloride 0.9 % 100 mL IVPB     05/30/19 0937 05/30/19 1108   05/30/19 0945  azithromycin (ZITHROMAX) 500 mg in sodium chloride 0.9 % 250 mL IVPB     05/30/19 0937 05/30/19 1026       Devices    LINES / TUBES:      Continuous Infusions: . sodium chloride Stopped (05/31/19 1946)     Objective: Vitals:   06/07/19 1525 06/07/19 1556 06/07/19 2023 06/07/19 2115  BP: (!) 131/59 139/67  140/66  Pulse: 100 (!) 101  100  Resp: (!) 24 18    Temp: 98.3 F (36.8 C) 98.4 F (36.9 C)  98.7 F (37.1 C)  TempSrc:  Oral  Oral  SpO2: 91% 93% 95%   Weight:      Height:        Intake/Output Summary (Last 24 hours) at 06/07/2019 2116 Last data filed at 06/07/2019 1800 Gross per 24 hour  Intake 1045.59 ml  Output 700 ml  Net 345.59 ml   Filed Weights   06/05/19 0100 06/06/19 0100  06/07/19 0631  Weight: 58.6 kg 58 kg 57 kg    Examination:  General: A/O x4, positive acute respiratory distress Eyes: negative scleral hemorrhage, negative anisocoria, negative icterus ENT: Negative Runny nose, negative gingival bleeding, poor dentition Neck:  Negative scars, masses, torticollis, lymphadenopathy, JVD Lungs: Diffuse crackles bilaterally without wheezes  Cardiovascular: Regular rate and rhythm without murmur gallop or rub normal S1 and S2 Abdomen: negative abdominal pain, nondistended, positive soft, bowel sounds, no rebound, no ascites, no appreciable mass Extremities: No significant cyanosis, clubbing, or edema bilateral lower extremities Skin: Negative rashes, lesions, ulcers Psychiatric:  Negative depression, negative anxiety, negative fatigue, negative mania Central nervous system:  Cranial nerves II through XII intact, tongue/uvula midline, all extremities muscle strength 5/5, sensation intact throughout, negative dysarthria, negative expressive aphasia, negative receptive aphasia.  .     Data Reviewed: Care during the described time interval was provided by me .  I have reviewed this patient's available data, including medical history, events of note, physical examination, and all test results as part of my evaluation.   CBC: Recent Labs  Lab 06/03/19 0629 06/04/19 0444 06/05/19 0219 06/06/19 0244 06/07/19 0928  WBC 11.3* 10.1 10.1 13.3* 11.7*  NEUTROABS 8.5* 6.7 6.1 9.3* 8.9*  HGB 10.1* 9.7* 10.2* 10.7* 10.6*  HCT 31.5* 31.1* 32.6* 34.3* 34.4*  MCV 72.1* 72.8* 72.8* 73.9* 75.6*  PLT 238 277 353 409* [id number]*   Basic Metabolic Panel: Recent Labs  Lab 06/01/19 0348 06/01/19 0348 06/02/19 0819 06/03/19 0629 06/04/19 0444 06/05/19 0219 06/07/19 0627  NA 138   < > 139 142 139 137 136  K 3.5   < > 3.5 3.3* 4.4 4.3 4.9  CL 107   < > 104 108 106 102 100  CO2 20*   < > 20* 22 24 23 25   GLUCOSE 127*   < > 155* 103* 160* 184* 167*  BUN 19   < > 14 10 9 9  11   CREATININE 0.90   < > 0.67 0.65 0.70 0.74 0.75  CALCIUM 7.9*   < > 8.5* 8.1* 8.0* 8.5* 9.5  MG 2.3  --  1.8 1.6* 1.7  --   --   PHOS  --   --  2.4* 3.4  --   --   --    < > = values in this interval not displayed.   GFR: Estimated Creatinine Clearance: 40.5 mL/min (by C-G formula based on SCr of 0.75 mg/dL). Liver Function Tests: Recent Labs  Lab 06/04/19 0444 06/05/19 0219 06/07/19 0627  AST 34 33 36  ALT 52* 43 40  ALKPHOS 79 83 83  BILITOT 0.9 1.6* 1.2  PROT 5.3* 6.0* 6.1*  ALBUMIN 2.2* 2.4* 2.6*   No results for input(s): LIPASE, AMYLASE in the last 168 hours. No results for input(s): AMMONIA in the last 168 hours. Coagulation Profile: No results for input(s): INR, PROTIME in the last 168 hours. Cardiac Enzymes: No results for input(s): CKTOTAL, CKMB, CKMBINDEX, TROPONINI in the last 168 hours. BNP (last 3 results) Recent Labs    08/08/18 1507  PROBNP 86.0   HbA1C: No results for input(s): HGBA1C in the last 72 hours. CBG: Recent Labs  Lab 06/06/19 1631 06/06/19 2142 06/07/19 0627 06/07/19 1049 06/07/19 1655  GLUCAP 202* 125* 174* 236* 146*   Lipid Profile: No results for input(s): CHOL, HDL, LDLCALC, TRIG, CHOLHDL, LDLDIRECT in the last 72 hours. Thyroid Function Tests: No results for input(s): TSH, T4TOTAL, FREET4, T3FREE, THYROIDAB in the last 72 hours. Anemia Panel: No results for input(s): VITAMINB12, FOLATE, FERRITIN, TIBC, IRON, RETICCTPCT in the last 72 hours. Urine analysis:    Component Value Date/Time   COLORURINE YELLOW 05/30/2019 1053   APPEARANCEUR HAZY (A) 05/30/2019 1053   LABSPEC 1.017 05/30/2019 1053   PHURINE 5.0 05/30/2019 1053   GLUCOSEU >=500 (A) 05/30/2019 1053   GLUCOSEU 500 (A) 07/08/2018 0814   HGBUR SMALL (A) 05/30/2019 1053   HGBUR negative 04/30/2010 0816   BILIRUBINUR NEGATIVE 05/30/2019 1053   BILIRUBINUR neg 05/24/2019 1326   KETONESUR 20 (A) 05/30/2019 1053   PROTEINUR 30 (A) 05/30/2019 1053   UROBILINOGEN  negative (A) 05/24/2019 1326   UROBILINOGEN 0.2 07/08/2018 0814   NITRITE NEGATIVE 05/30/2019 1053   LEUKOCYTESUR TRACE (A) 05/30/2019 1053   Sepsis Labs: @LABRCNTIP (procalcitonin:4,lacticidven:4)  ) Recent Results (from the past 240 hour(s))  Urine culture     Status: Abnormal   Collection Time: 05/30/19  9:32 AM   Specimen: In/Out Cath Urine  Result Value Ref Range Status   Specimen Description IN/OUT CATH URINE  Final   Special Requests  Final    NONE Performed at [facility], [place] [address] [place], [place] [address]    Culture (A)  Final    >=100,000 COLONIES/mL ESCHERICHIA COLI Confirmed Extended Spectrum Beta-Lactamase Producer (ESBL).  In bloodstream infections from ESBL organisms, carbapenems are preferred over piperacillin/tazobactam. They are shown to have a lower risk of mortality.    Report Status 06/01/2019 FINAL  Final   Organism ID, Bacteria ESCHERICHIA COLI (A)  Final      Susceptibility   Escherichia coli - MIC*    AMPICILLIN >=32 RESISTANT Resistant     CEFAZOLIN >=64 RESISTANT Resistant     CEFTRIAXONE 32 RESISTANT Resistant     CIPROFLOXACIN >=4 RESISTANT Resistant     GENTAMICIN <=1 SENSITIVE Sensitive     IMIPENEM <=0.25 SENSITIVE Sensitive     NITROFURANTOIN <=16 SENSITIVE Sensitive     TRIMETH/SULFA >=320 RESISTANT Resistant     AMPICILLIN/SULBACTAM >=32 RESISTANT Resistant     PIP/TAZO 8 SENSITIVE Sensitive     * >=100,000 COLONIES/mL ESCHERICHIA COLI  Respiratory Panel by RT PCR (Flu A&B, Covid) - Nasopharyngeal Swab     Status: None   Collection Time: 05/30/19  9:59 AM   Specimen: Nasopharyngeal Swab  Result Value Ref Range Status   SARS Coronavirus 2 by RT PCR NEGATIVE NEGATIVE Final    Comment: (NOTE) SARS-CoV-2 target nucleic acids are NOT DETECTED. The SARS-CoV-2 RNA is generally detectable in upper respiratoy specimens during the acute phase of infection. The lowest concentration of SARS-CoV-2 viral copies this assay can detect  is 131 copies/mL. A negative result does not preclude SARS-Cov-2 infection and should not be used as the sole basis for treatment or other patient management decisions. A negative result may occur with  improper specimen collection/handling, submission of specimen other than nasopharyngeal swab, presence of viral mutation(s) within the areas targeted by this assay, and inadequate number of viral copies (<131 copies/mL). A negative result must be combined with clinical observations, patient history, and epidemiological information. The expected result is Negative. Fact Sheet for Patients:  PinkCheek.be Fact Sheet for Healthcare Providers:  GravelBags.it This test is not yet ap proved or cleared by the [place] FDA and  has been authorized for detection and/or diagnosis of SARS-CoV-2 by FDA under an Emergency Use Authorization (EUA). This EUA will remain  in effect (meaning this test can be used) for the duration of the COVID-19 declaration under Section 564(b)(1) of the Act, 21 U.S.C. section 360bbb-3(b)(1), unless the authorization is terminated or revoked sooner.    Influenza A by PCR NEGATIVE NEGATIVE Final   Influenza B by PCR NEGATIVE NEGATIVE Final    Comment: (NOTE) The Xpert Xpress SARS-CoV-2/FLU/RSV assay is intended as an aid in  the diagnosis of influenza from Nasopharyngeal swab specimens and  should not be used as a sole basis for treatment. Nasal washings and  aspirates are unacceptable for Xpert Xpress SARS-CoV-2/FLU/RSV  testing. Fact Sheet for Patients: PinkCheek.be Fact Sheet for Healthcare Providers: GravelBags.it This test is not yet approved or cleared by the [place] FDA and  has been authorized for detection and/or diagnosis of SARS-CoV-2 by  FDA under an Emergency Use Authorization (EUA). This EUA will remain  in effect (meaning this  test can be used) for the duration of the  Covid-19 declaration under Section 564(b)(1) of the Act, 21  U.S.C. section 360bbb-3(b)(1), unless the authorization is  terminated or revoked. Performed at [facility], [place] [address], [place], [place] [address]  Blood Culture (routine x 2)     Status: Abnormal   Collection Time: 05/30/19 10:00 AM   Specimen: BLOOD  Result Value Ref Range Status   Specimen Description BLOOD LEFT ANTECUBITAL  Final   Special Requests   Final    BOTTLES DRAWN AEROBIC AND ANAEROBIC Blood Culture adequate volume   Culture  Setup Time   Final    GRAM NEGATIVE RODS IN BOTH AEROBIC AND ANAEROBIC BOTTLES CRITICAL RESULT CALLED TO, READ BACK BY AND VERIFIED WITH: PHRMD K AMEND @0153  05/31/19 BY S GEZAHEGN Performed at [facility], [address], [place], [place] [address]    Culture (A)  Final    ESCHERICHIA COLI Confirmed Extended Spectrum Beta-Lactamase Producer (ESBL).  In bloodstream infections from ESBL organisms, carbapenems are preferred over piperacillin/tazobactam. They are shown to have a lower risk of mortality.    Report Status 06/02/2019 FINAL  Final   Organism ID, Bacteria ESCHERICHIA COLI  Final      Susceptibility   Escherichia coli - MIC*    AMPICILLIN >=32 RESISTANT Resistant     CEFAZOLIN >=64 RESISTANT Resistant     CEFEPIME 1 SENSITIVE Sensitive     CEFTAZIDIME RESISTANT Resistant     CEFTRIAXONE 32 RESISTANT Resistant     CIPROFLOXACIN >=4 RESISTANT Resistant     GENTAMICIN <=1 SENSITIVE Sensitive     IMIPENEM <=0.25 SENSITIVE Sensitive     TRIMETH/SULFA >=320 RESISTANT Resistant     AMPICILLIN/SULBACTAM >=32 RESISTANT Resistant     PIP/TAZO 8 SENSITIVE Sensitive     * ESCHERICHIA COLI  Blood Culture ID Panel (Reflexed)     Status: Abnormal   Collection Time: 05/30/19 10:00 AM  Result Value Ref Range Status   Enterococcus species NOT DETECTED NOT DETECTED Final   Listeria monocytogenes NOT DETECTED NOT DETECTED Final    Staphylococcus species NOT DETECTED NOT DETECTED Final   Staphylococcus aureus (BCID) NOT DETECTED NOT DETECTED Final   Streptococcus species NOT DETECTED NOT DETECTED Final   Streptococcus agalactiae NOT DETECTED NOT DETECTED Final   Streptococcus pneumoniae NOT DETECTED NOT DETECTED Final   Streptococcus pyogenes NOT DETECTED NOT DETECTED Final   Acinetobacter baumannii NOT DETECTED NOT DETECTED Final   Enterobacteriaceae species DETECTED (A) NOT DETECTED Final    Comment: Enterobacteriaceae represent a large family of gram-negative bacteria, not a single organism. CRITICAL RESULT CALLED TO, READ BACK BY AND VERIFIED WITH: PHRMD K AMEND @0153  05/31/19 BY S GEZAHEGN    Enterobacter cloacae complex NOT DETECTED NOT DETECTED Final   Escherichia coli DETECTED (A) NOT DETECTED Final    Comment: CRITICAL RESULT CALLED TO, READ BACK BY AND VERIFIED WITH: PHRMD K AMEND @0153  05/31/19 BY S GEZAHEGN    Klebsiella oxytoca NOT DETECTED NOT DETECTED Final   Klebsiella pneumoniae NOT DETECTED NOT DETECTED Final   Proteus species NOT DETECTED NOT DETECTED Final   Serratia marcescens NOT DETECTED NOT DETECTED Final   Carbapenem resistance NOT DETECTED NOT DETECTED Final   Haemophilus influenzae NOT DETECTED NOT DETECTED Final   Neisseria meningitidis NOT DETECTED NOT DETECTED Final   Pseudomonas aeruginosa NOT DETECTED NOT DETECTED Final   Candida albicans NOT DETECTED NOT DETECTED Final   Candida glabrata NOT DETECTED NOT DETECTED Final   Candida krusei NOT DETECTED NOT DETECTED Final   Candida parapsilosis NOT DETECTED NOT DETECTED Final   Candida tropicalis NOT DETECTED NOT DETECTED Final    Comment: Performed at [facility], [place] [address], [place], [place] [address]  MRSA PCR Screening     Status: None   Collection Time: 05/30/19  4:19 PM   Specimen: Nasal Mucosa; Nasopharyngeal  Result Value Ref Range Status   MRSA by PCR NEGATIVE NEGATIVE Final    Comment:        The GeneXpert MRSA  Assay (FDA approved for NASAL specimens only), is one component of a comprehensive MRSA colonization surveillance program. It is not intended to diagnose MRSA infection nor to guide or monitor treatment for MRSA infections. Performed at [facility], [place] [address] [place], [place] [address]   C Difficile Quick Screen w PCR reflex     Status: None   Collection Time: 06/07/19 12:39 PM   Specimen: STOOL  Result Value Ref Range Status   C Diff antigen NEGATIVE NEGATIVE Final   C Diff toxin NEGATIVE NEGATIVE Final   C Diff interpretation No C. difficile detected.  Final    Comment: Performed at [facility], [place] [address] [place], [place] [address]         Radiology Studies: DG Chest 2 View  Result Date: 06/06/2019 CLINICAL DATA:  Pneumonia. EXAM: CHEST - 2 VIEW COMPARISON:  June 06, 2019. FINDINGS: The heart size and mediastinal contours are within normal limits. No pneumothorax is noted. Increased interstitial densities are noted in both lung bases concerning for pulmonary edema or possibly infiltrates and probable small bilateral pleural effusions. The visualized skeletal structures are unremarkable. IMPRESSION: Increased bibasilar interstitial densities are noted concerning for pulmonary edema or possibly infiltrates and probable small bilateral pleural effusions. Electronically Signed   By: Marijo Conception M.D.   On: 06/06/2019 17:37        Scheduled Meds: . aspirin EC  81 mg Oral Daily  . budesonide  0.25 mg Inhalation BID  . Chlorhexidine Gluconate Cloth  6 each Topical Daily  . DULoxetine  30 mg Oral Daily  . furosemide  40 mg Intravenous BID  . gabapentin  100 mg Oral BID  . heparin  5,000 Units Subcutaneous Q8H  . insulin aspart  0-15 Units Subcutaneous TID WC  . insulin aspart  4 Units Subcutaneous TID WC  . insulin detemir  12 Units Subcutaneous QHS  . latanoprost  1 drop Both Eyes QHS  . levothyroxine  25 mcg Oral Q0600  . magnesium oxide  800  mg Oral BID  . mouth rinse  15 mL Mouth Rinse BID  . metoprolol tartrate  25 mg Oral BID  . potassium chloride  40 mEq Oral BID  . sodium chloride flush  10-40 mL Intracatheter Q12H  . timolol  1 drop Both Eyes Daily   Continuous Infusions: . sodium chloride Stopped (05/31/19 1946)     LOS: 8 days   The patient is critically ill with multiple organ systems failure and requires high complexity decision making for assessment and support, frequent evaluation and titration of therapies, application of advanced monitoring technologies and extensive interpretation of multiple databases. Critical Care Time devoted to patient care services described in this note  Time spent: 40 minutes     Deah Ottaway, Geraldo Docker, MD [facility] Pager [phone]  If 7PM-7AM, please contact night-coverage [email or web address] Password [facility] 06/07/2019, 9:16 PM

## 2019-06-07 NOTE — Progress Notes (Signed)
Occupational Therapy Treatment Patient Details Name: Mary Flynn MRN: [id number] DOB: [birth year] Today's Date: 06/07/2019    History of present illness Pt is an 85 y/o female with h/o DM and HTN with recent dx of UTI.  She apparently misunderstood when to take the Cipro and by day of admission had AMS.  Work up for worsening UTI/sepsis and acute hypoxic respiratory failure.   OT comments  Pt progressing to showering task with BSC as Mary Flynn in bathroom. Pt supervisionA for ADL task; partially standing for task with more than 2 rest breaks taken. Pt supervisionA for dressing tasks. Pt overall deconditioned and requiring O2. Pt requiring 4L O2 to remain >88-90% O2 with exertion. HR up to 125 BPM. Pt's daughter in room regarding safety at home; pt would benefit from tub bench and HH therapies for safety. OT following acutely.    Follow Up Recommendations  Home health OT;Supervision - Intermittent    Equipment Recommendations  Tub/shower bench    Recommendations for Other Services      Precautions / Restrictions Precautions Precautions: Fall Precaution Comments: monitor SPO2 Restrictions Weight Bearing Restrictions: No       Mobility Bed Mobility Overal bed mobility: Modified Independent                Transfers Overall transfer level: Needs assistance Equipment used: None Transfers: Sit to/from Stand Sit to Stand: Supervision              Balance Overall balance assessment: Needs assistance Sitting-balance support: Feet supported Sitting balance-Mary Flynn: Good     Standing balance support: During functional activity;No upper extremity supported Standing balance-Mary Flynn: Fair Standing balance comment: holding onto grab bars PRN                           ADL either performed or assessed with clinical judgement   ADL Overall ADL's : Needs assistance/impaired         Upper Body Bathing: Set up;Sitting Upper Body Bathing Details (indicate cue  type and reason): Pt prefering to sit down for task Lower Body Bathing: Supervison/ safety;Sitting/lateral leans;Sit to/from stand Lower Body Bathing Details (indicate cue type and reason): Pt standing for pericare x2 mins, otherwise pt showering in sitting Upper Body Dressing : Set up;Sitting   Lower Body Dressing: Sitting/lateral leans;Sit to/from stand;Min guard;Cueing for safety Lower Body Dressing Details (indicate cue type and reason): Pt does not prefer using RW for stability. Pt donning socks on in sitting Toilet Transfer: Min guard;Ambulation;RW   Toileting- Clothing Manipulation and Hygiene: Supervision/safety;Sitting/lateral lean;Sit to/from stand       Functional mobility during ADLs: Min guard;Cueing for safety General ADL Comments: Pt performing showering routine mostly in sitting due to safety. Pt has Mary Flynn at home, but daughter in room and discussion of use of tub bench for safety at home.     Vision   Vision Assessment?: No apparent visual deficits Additional Comments: glasses for reading   Perception     Praxis      Cognition Arousal/Alertness: Awake/alert Behavior During Therapy: WFL for tasks assessed/performed Overall Cognitive Status: Impaired/Different from baseline Area of Impairment: Safety/judgement                         Safety/Judgement: Decreased awareness of safety     General Comments: Pt not always using RW for stability requiring cues        Exercises  Shoulder Instructions       General Comments O2 >90% on 4L; 2L O2 at rest >90% after pursed lip breathing. 125 BPM increased HR with exertion    Pertinent Vitals/ Pain       Pain Assessment: No/denies pain  Home Living                                          Prior Functioning/Environment              Frequency  Min 2X/week        Progress Toward Goals  OT Goals(current goals can now be found in the care plan section)  Progress towards OT  goals: Progressing toward goals  Acute Rehab OT Goals Patient Stated Goal: back home OT Goal Formulation: With patient Time For Goal Achievement: 06/19/19 Potential to Achieve Goals: Good ADL Goals Additional ADL Goal #1: Pt will participate in 15 mins of ADL routine with modified independence with 2 seated rest breaks in order to increase activity tolerance. Additional ADL Goal #2: Pt will perform ADL task/ADL functional mobility with >90% O2 requiring <2 seated rest breaks throughout tasks.  Plan Discharge plan remains appropriate    Co-evaluation                 AM-PAC OT "6 Clicks" Daily Activity     Outcome Measure   Help from another person eating meals?: None Help from another person taking care of personal grooming?: A Little Help from another person toileting, which includes using toliet, bedpan, or urinal?: None Help from another person bathing (including washing, rinsing, drying)?: None Help from another person to put on and taking off regular upper body clothing?: None Help from another person to put on and taking off regular lower body clothing?: A Little 6 Click Score: 22    End of Session Equipment Utilized During Treatment: Gait belt;Oxygen;Rolling walker  OT Visit Diagnosis: Unsteadiness on feet (R26.81);Muscle weakness (generalized) (M62.81)   Activity Tolerance Patient tolerated treatment well   Patient Left in bed;with call bell/phone within reach;with family/visitor present(daughter and spouse)   Nurse Communication Mobility status        Time: [id number] OT Time Calculation (min): 44 min  Charges: OT General Charges $OT Visit: 1 Visit OT Treatments $Self Care/Home Management : 38-52 mins  Mary Flynn, OTR/L Acute Rehabilitation Services Pager: [phone] Office: [phone]    Mary Flynn C 06/07/2019, 4:51 PM

## 2019-06-07 NOTE — Progress Notes (Signed)
Physical Therapy Treatment Patient Details Name: Mary Flynn MRN: [id number] DOB: 01-16-1935 Today's Date: 06/07/2019    History of Present Illness Pt is an 85 y/o female with h/o DM and HTN with recent dx of UTI.  She apparently misunderstood when to take the Cipro and by day of admission had AMS.  Work up for worsening UTI/sepsis and acute hypoxic respiratory failure.    PT Comments    Pt making steady progress overall with functional mobility. She was able to ambulate a short distance (~8') without an AD; however, stated that she felt safer with use of RW. Pt on 4L of O2 throughout with SPO2 maintaining >93%. HR stable as well in the mid 90's. However, RR increasing to as high as 37 with PT suggesting standing rest break at that time. Continue to recommend HHPT services to continue to work on overall strength, balance and endurance. Pt would continue to benefit from skilled physical therapy services at this time while admitted and after d/c to address the below listed limitations in order to improve overall safety and independence with functional mobility.    Follow Up Recommendations  Home health PT;Supervision/Assistance - 24 hour;Other (comment)     Equipment Recommendations  Rolling walker with 5" wheels    Recommendations for Other Services       Precautions / Restrictions Precautions Precautions: Fall Precaution Comments: monitor SPO2 Restrictions Weight Bearing Restrictions: No    Mobility  Bed Mobility Overal bed mobility: Modified Independent                Transfers Overall transfer level: Needs assistance Equipment used: None Transfers: Sit to/from Stand Sit to Stand: Supervision         General transfer comment: good technique  Ambulation/Gait Ambulation/Gait assistance: Min guard Gait Distance (Feet): 200 Feet Assistive device: Rolling walker (2 wheeled) Gait Pattern/deviations: Step-through pattern;Decreased stride length;Drifts  right/left Gait velocity: decreased   General Gait Details: cues to maintain proximity to RW and for safety with RW management in hallway   Stairs             Wheelchair Mobility    Modified Rankin (Stroke Patients Only)       Balance Overall balance assessment: Needs assistance Sitting-balance support: Feet supported Sitting balance-[name]: Good     Standing balance support: During functional activity;No upper extremity supported Standing balance-[name]: Fair                              Cognition Arousal/Alertness: Awake/alert Behavior During Therapy: WFL for tasks assessed/performed Overall Cognitive Status: Impaired/Different from baseline Area of Impairment: Safety/judgement                         Safety/Judgement: Decreased awareness of safety            Exercises General Exercises - Lower Extremity Ankle Circles/Pumps: AROM;Both;10 reps;Seated Hip ABduction/ADduction: AROM;Both;10 reps;Seated    General Comments        Pertinent Vitals/Pain Pain Assessment: No/denies pain    Home Living                      Prior Function            PT Goals (current goals can now be found in the care plan section) Acute Rehab PT Goals PT Goal Formulation: With patient Time For Goal Achievement: 06/09/19 Potential to Achieve Goals:  Good Progress towards PT goals: Progressing toward goals    Frequency    Min 3X/week      PT Plan Current plan remains appropriate    Co-evaluation              AM-PAC PT "6 Clicks" Mobility   Outcome Measure  Help needed turning from your back to your side while in a flat bed without using bedrails?: None Help needed moving from lying on your back to sitting on the side of a flat bed without using bedrails?: None Help needed moving to and from a bed to a chair (including a wheelchair)?: None Help needed standing up from a chair using your arms (e.g., wheelchair or  bedside chair)?: None Help needed to walk in hospital room?: A Little Help needed climbing 3-5 steps with a railing? : A Little 6 Click Score: 22    End of Session Equipment Utilized During Treatment: Gait belt;Oxygen Activity Tolerance: Patient tolerated treatment well Patient left: in chair;with call bell/phone within reach Nurse Communication: Mobility status;Precautions PT Visit Diagnosis: Other abnormalities of gait and mobility (R26.89);Difficulty in walking, not elsewhere classified (R26.2)     Time: [id number] PT Time Calculation (min) (ACUTE ONLY): 24 min  Charges:  $Gait Training: 8-22 mins $Therapeutic Activity: 8-22 mins                     Anastasio Champion, DPT  Acute Rehabilitation Services Pager [phone] Office [place] 06/07/2019, 11:11 AM

## 2019-06-08 DIAGNOSIS — R7881 Bacteremia: Secondary | ICD-10-CM | POA: Diagnosis not present

## 2019-06-08 DIAGNOSIS — N3000 Acute cystitis without hematuria: Secondary | ICD-10-CM | POA: Diagnosis not present

## 2019-06-08 DIAGNOSIS — N179 Acute kidney failure, unspecified: Secondary | ICD-10-CM | POA: Diagnosis not present

## 2019-06-08 DIAGNOSIS — A419 Sepsis, unspecified organism: Secondary | ICD-10-CM | POA: Diagnosis not present

## 2019-06-08 LAB — CBC WITH DIFFERENTIAL/PLATELET
Abs Immature Granulocytes: 0.2 10*3/uL — ABNORMAL HIGH (ref 0.00–0.07)
Basophils Absolute: 0.1 10*3/uL (ref 0.0–0.1)
Basophils Relative: 0 %
Eosinophils Absolute: 0.2 10*3/uL (ref 0.0–0.5)
Eosinophils Relative: 2 %
HCT: 34.2 % — ABNORMAL LOW (ref 36.0–46.0)
Hemoglobin: 10.7 g/dL — ABNORMAL LOW (ref 12.0–15.0)
Immature Granulocytes: 2 %
Lymphocytes Relative: 19 %
Lymphs Abs: 2.2 10*3/uL (ref 0.7–4.0)
MCH: 23.4 pg — ABNORMAL LOW (ref 26.0–34.0)
MCHC: 31.3 g/dL (ref 30.0–36.0)
MCV: 74.8 fL — ABNORMAL LOW (ref 80.0–100.0)
Monocytes Absolute: 0.9 10*3/uL (ref 0.1–1.0)
Monocytes Relative: 8 %
Neutro Abs: 7.8 10*3/uL — ABNORMAL HIGH (ref 1.7–7.7)
Neutrophils Relative %: 69 %
Platelets: 470 10*3/uL — ABNORMAL HIGH (ref 150–400)
RBC: 4.57 MIL/uL (ref 3.87–5.11)
RDW: 21.4 % — ABNORMAL HIGH (ref 11.5–15.5)
WBC: 11.4 10*3/uL — ABNORMAL HIGH (ref 4.0–10.5)
nRBC: 0 % (ref 0.0–0.2)

## 2019-06-08 LAB — COMPREHENSIVE METABOLIC PANEL
ALT: 33 U/L (ref 0–44)
AST: 27 U/L (ref 15–41)
Albumin: 3.3 g/dL — ABNORMAL LOW (ref 3.5–5.0)
Alkaline Phosphatase: 83 U/L (ref 38–126)
Anion gap: 13 (ref 5–15)
BUN: 13 mg/dL (ref 8–23)
CO2: 26 mmol/L (ref 22–32)
Calcium: 9.8 mg/dL (ref 8.9–10.3)
Chloride: 94 mmol/L — ABNORMAL LOW (ref 98–111)
Creatinine, Ser: 0.8 mg/dL (ref 0.44–1.00)
GFR calc Af Amer: >60 mL/min (ref >60)
GFR calc non Af Amer: >60 mL/min (ref >60)
Glucose, Bld: 169 mg/dL — ABNORMAL HIGH (ref 70–99)
Potassium: 4.4 mmol/L (ref 3.5–5.1)
Sodium: 133 mmol/L — ABNORMAL LOW (ref 135–145)
Total Bilirubin: 1.4 mg/dL — ABNORMAL HIGH (ref 0.3–1.2)
Total Protein: 6.8 g/dL (ref 6.5–8.1)

## 2019-06-08 LAB — MAGNESIUM: Magnesium: 1.8 mg/dL (ref 1.7–2.4)

## 2019-06-08 LAB — GLUCOSE, CAPILLARY
Glucose-Capillary: 170 mg/dL — ABNORMAL HIGH (ref 70–99)
Glucose-Capillary: 239 mg/dL — ABNORMAL HIGH (ref 70–99)
Glucose-Capillary: 125 mg/dL — ABNORMAL HIGH (ref 70–99)
Glucose-Capillary: 242 mg/dL — ABNORMAL HIGH (ref 70–99)

## 2019-06-08 LAB — PROCALCITONIN: Procalcitonin: 0.35 ng/mL

## 2019-06-08 LAB — PHOSPHORUS: Phosphorus: 5.2 mg/dL — ABNORMAL HIGH (ref 2.5–4.6)

## 2019-06-08 NOTE — Plan of Care (Signed)

## 2019-06-08 NOTE — Progress Notes (Signed)
PROGRESS NOTE    Mary Flynn  [id number] DOB: [birth year] DOA: 05/30/2019 PCP: Mary Alar, NP   Brief Narrative:  85 year old BF PMHx Depression, Memory loss, DM type II uncontrolled with complication, peripheral neuropathy, mixed dyslipidemia, subclinical hypothyroidism, RIGHT thyroid nodule, essential HTN, chronic pain syndrome (LEFT rib fracture, compression fracture L2)    Patient was recently seen and treated for acute cystitis through the family medicine office, had UTI, treated with ciprofloxacin.  Medication never started  According to the paramedics who transported the patient to the hospital she was found with a change in her mental status this morning when someone was talking to her on the phone.  This was a neighbor, when the paramedics arrived they found the patient to be hypoxic, tachycardic, ill-appearing slightly confused and had a temperature over 99.9, the activated a code sepsis prehospital.  No antibiotics were given, oxygen was supplied by nonrebreather.  Further review of the medical record shows that the patient had a change in her medication to nitrofurantoin yesterday.  The patient does not have any history of pulmonary embolism, however she is a diabetic taking multiple different diet medications including Jardiance and Metformin   Subjective: 4/15 A/O x4, negative CP, positive S OB but improved from 4/14 sitting in chair comfortably   Assessment & Plan:   Active Problems:   Septic shock (HCC)   AKI (acute kidney injury) ([place])   Severe sepsis with septic shock (HCC)   Bacteremia due to Escherichia coli   Pulmonary hypertension (HCC)   Demand ischemia (HCC)   Diabetes mellitus type 2, uncontrolled, with complications (HCC)  Septic shock positive ESBL bacteremia -Completed 7-day course of Meropenem   Acute respiratory failure with hypoxia -Multifactorial pulmonary hypertension, pulmonary edema. -Treat underlying conditions. -Titrate to  maintain SPO2> 94%  CHF/pulmonary HTN -Dry weight 132 pounds (59.9 kg) per Mary Flynn note from 05/10/2019 -Echocardiogram 4/7 showed hyperdynamic EF with moderate pulmonary HTN see results below. -Strict in and out -1.0 L -Daily weight  Filed Weights   06/06/19 0100 06/07/19 0631 06/08/19 0323  Weight: 58 kg 57 kg 56 kg  -Metoprolol 25 mg BID -4/14 Albumin 25 g x 1 -Lasix IV 40 mg BID  Tachycardia NOS -See CHF -Resolved   Demand ischemia secondary to severe sepsis -See echocardiogram below -Resolved  Acute kidney injury -Prior to admission normal creatinine -Continue to monitor creatinine level with diuresis Recent Labs  Lab 06/03/19 0629 06/04/19 0444 06/05/19 0219 06/07/19 0627 06/08/19 0221  CREATININE 0.65 0.70 0.74 0.75 0.80    C. difficile colitis? -Patient now having episodes of diarrhea, C. difficile culture pending. -Metformin also discontinued as may be contributing to her diarrhea. -4/12 procalcitonin elevated= 1.42, will redraw in the A.m. Results for Mary, Flynn (MRN [id number]) as of 06/09/2019 08:10  Ref. Range 06/05/2019 09:21 06/08/2019 02:21 06/09/2019 01:46  Procalcitonin Latest Units: ng/mL 1.42 0.35 0.23  -4/14 C. difficile screen negative  DM type II uncontrolled with complication -4/6 hemoglobin A1c= 8.2 -Levemir 12 units qhs -NovoLog 4 units qac -Moderate SSI  Hypothyroid -Synthroid 25 mcg daily -Recheck TSH in 4-6 weeks - GI prophylaxis Hold Protonix at this time  Prior cervicalgia On discharge discontinue amitriptyline 12.5, Flexeril 10--on beers criteria  Hyperlipidemia DC Niaspan 1000 CR --resume on d/c atorvastatin 10     DVT prophylaxis: Heparin subcu Code Status: Full Family Communication: 4/15 spoke with Dr. Janese Banks (sister) discussed plan of care answered all questions Disposition Plan:  1.  Where the patient  is from 2.  Anticipated d/c place. 3.  Barriers to d/c resolution pulmonary edema.  Most likely will  require rehab   Consultants:  PCCM  Procedures/Significant Events:  4/6 CTA PE protocol; negative PE.-Bibasilar infiltrates RIGHT>>> LEFT with mild interstitial edema 4/7 Echocardiogram;Left Ventricle: LVEF= 70 to 75%. The left ventricle has hyperdynamic function.-no regional wall motion abnormalities. The left ventricular internal cavity size was normal in size.  -Moderate LVH Right Ventricle: Moderately elevated pulmonary artery systolic pressure.  estimated right ventricular systolic pressure is [id number] mmHg.  Venous: The inferior vena cava is dilated in size with less than 50% respiratory variability, suggesting right atrial pressure of 15 mmHg.     I have personally reviewed and interpreted all radiology studies and my findings are as above.  VENTILATOR SETTINGS: Nasal cannula 4/15 Flow; 2 L/min SPO2 90%   Cultures 4/6 SARS coronavirus negative 4/6 influenza A/B negative 4/6 blood left AC positive E. coli 4/6 urine positive E. Coli 4/6 MRSA by PCR negative 4/14 C. difficile antigen/toxin negative      Antimicrobials: Anti-infectives (From admission, onward)   Start     Ordered Stop   06/05/19 1000  meropenem (MERREM) 1 g in sodium chloride 0.9 % 100 mL IVPB     06/05/19 0849 06/05/19 2228   06/04/19 1000  meropenem (MERREM) 1 g in sodium chloride 0.9 % 100 mL IVPB     06/04/19 0713 06/04/19 2325   05/31/19 0200  meropenem (MERREM) 1 g in sodium chloride 0.9 % 100 mL IVPB  Status:  Discontinued     05/30/19 1252 06/04/19 0713   05/30/19 1300  meropenem (MERREM) 1 g in sodium chloride 0.9 % 100 mL IVPB     05/30/19 1251 05/30/19 1413   05/30/19 0945  levofloxacin (LEVAQUIN) IVPB 750 mg  Status:  Discontinued     05/30/19 0933 05/30/19 0937   05/30/19 0945  cefTRIAXone (ROCEPHIN) 1 g in sodium chloride 0.9 % 100 mL IVPB     05/30/19 0937 05/30/19 1108   05/30/19 0945  azithromycin (ZITHROMAX) 500 mg in sodium chloride 0.9 % 250 mL IVPB     05/30/19 0937 05/30/19  1026       Devices    LINES / TUBES:      Continuous Infusions: . sodium chloride Stopped (05/31/19 1946)     Objective: Vitals:   06/07/19 2328 06/08/19 0323 06/08/19 0719 06/08/19 0720  BP:  130/68  (!) 138/116  Pulse:      Resp: 16 (!) 23  20  Temp:  98.3 F (36.8 C) (!) 97.3 F (36.3 C) (!) 97.3 F (36.3 C)  TempSrc:  Oral Oral Oral  SpO2:  100%  90%  Weight:  56 kg    Height:        Intake/Output Summary (Last 24 hours) at 06/08/2019 0830 Last data filed at 06/08/2019 0328 Gross per 24 hour  Intake 1045.59 ml  Output 1300 ml  Net -254.41 ml   Filed Weights   06/06/19 0100 06/07/19 0631 06/08/19 0323  Weight: 58 kg 57 kg 56 kg   Physical Exam:  General: A/O x4, positive acute respiratory distress Eyes: negative scleral hemorrhage, negative anisocoria, negative icterus ENT: Negative Runny nose, negative gingival bleeding, Neck:  Negative scars, masses, torticollis, lymphadenopathy, JVD Lungs: Mild diffuse crackles bilaterally without wheezes  Cardiovascular: Regular rate and rhythm without murmur gallop or rub normal S1 and S2 Abdomen: negative abdominal pain, nondistended, positive soft, bowel sounds, no rebound, no ascites,  no appreciable mass Extremities: No significant cyanosis, clubbing, or edema bilateral lower extremities Skin: Negative rashes, lesions, ulcers Psychiatric:  Negative depression, negative anxiety, negative fatigue, negative mania  Central nervous system:  Cranial nerves II through XII intact, tongue/uvula midline, all extremities muscle strength 5/5, sensation intact throughout,  negative dysarthria, negative expressive aphasia, negative receptive aphasia.  .     Data Reviewed: Care during the described time interval was provided by me .  I have reviewed this patient's available data, including medical history, events of note, physical examination, and all test results as part of my evaluation.   CBC: Recent Labs  Lab  06/04/19 0444 06/05/19 0219 06/06/19 0244 06/07/19 0928 06/08/19 0221  WBC 10.1 10.1 13.3* 11.7* 11.4*  NEUTROABS 6.7 6.1 9.3* 8.9* 7.8*  HGB 9.7* 10.2* 10.7* 10.6* 10.7*  HCT 31.1* 32.6* 34.3* 34.4* 34.2*  MCV 72.8* 72.8* 73.9* 75.6* 74.8*  PLT 277 353 409* 450* [id number]*   Basic Metabolic Panel: Recent Labs  Lab 06/02/19 0819 06/02/19 0819 06/03/19 0629 06/04/19 0444 06/05/19 0219 06/07/19 0627 06/08/19 0221  NA 139   < > 142 139 137 136 133*  K 3.5   < > 3.3* 4.4 4.3 4.9 4.4  CL 104   < > 108 106 102 100 94*  CO2 20*   < > 22 24 23 25 26   GLUCOSE 155*   < > 103* 160* 184* 167* 169*  BUN 14   < > 10 9 9 11 13   CREATININE 0.67   < > 0.65 0.70 0.74 0.75 0.80  CALCIUM 8.5*   < > 8.1* 8.0* 8.5* 9.5 9.8  MG 1.8  --  1.6* 1.7  --   --  1.8  PHOS 2.4*  --  3.4  --   --   --  5.2*   < > = values in this interval not displayed.   GFR: Estimated Creatinine Clearance: 40.1 mL/min (by C-G formula based on SCr of 0.8 mg/dL). Liver Function Tests: Recent Labs  Lab 06/04/19 0444 06/05/19 0219 06/07/19 0627 06/08/19 0221  AST 34 33 36 27  ALT 52* 43 40 33  ALKPHOS 79 83 83 83  BILITOT 0.9 1.6* 1.2 1.4*  PROT 5.3* 6.0* 6.1* 6.8  ALBUMIN 2.2* 2.4* 2.6* 3.3*   No results for input(s): LIPASE, AMYLASE in the last 168 hours. No results for input(s): AMMONIA in the last 168 hours. Coagulation Profile: No results for input(s): INR, PROTIME in the last 168 hours. Cardiac Enzymes: No results for input(s): CKTOTAL, CKMB, CKMBINDEX, TROPONINI in the last 168 hours. BNP (last 3 results) Recent Labs    08/08/18 1507  PROBNP 86.0   HbA1C: No results for input(s): HGBA1C in the last 72 hours. CBG: Recent Labs  Lab 06/07/19 0627 06/07/19 1049 06/07/19 1655 06/07/19 2108 06/08/19 0620  GLUCAP 174* 236* 146* 139* 170*   Lipid Profile: No results for input(s): CHOL, HDL, LDLCALC, TRIG, CHOLHDL, LDLDIRECT in the last 72 hours. Thyroid Function Tests: No results for input(s):  TSH, T4TOTAL, FREET4, T3FREE, THYROIDAB in the last 72 hours. Anemia Panel: No results for input(s): VITAMINB12, FOLATE, FERRITIN, TIBC, IRON, RETICCTPCT in the last 72 hours. Urine analysis:    Component Value Date/Time   COLORURINE YELLOW 05/30/2019 1053   APPEARANCEUR HAZY (A) 05/30/2019 1053   LABSPEC 1.017 05/30/2019 1053   PHURINE 5.0 05/30/2019 1053   GLUCOSEU >=500 (A) 05/30/2019 1053   GLUCOSEU 500 (A) 07/08/2018 0814   HGBUR SMALL (A) 05/30/2019 1053  HGBUR negative 04/30/2010 0816   BILIRUBINUR NEGATIVE 05/30/2019 1053   BILIRUBINUR neg 05/24/2019 1326   KETONESUR 20 (A) 05/30/2019 1053   PROTEINUR 30 (A) 05/30/2019 1053   UROBILINOGEN negative (A) 05/24/2019 1326   UROBILINOGEN 0.2 07/08/2018 0814   NITRITE NEGATIVE 05/30/2019 1053   LEUKOCYTESUR TRACE (A) 05/30/2019 1053   Sepsis Labs: @LABRCNTIP (procalcitonin:4,lacticidven:4)  ) Recent Results (from the past 240 hour(s))  Urine culture     Status: Abnormal   Collection Time: 05/30/19  9:32 AM   Specimen: In/Out Cath Urine  Result Value Ref Range Status   Specimen Description IN/OUT CATH URINE  Final   Special Requests   Final    NONE Performed at [facility], [place] [address] [place], [place] [address]    Culture (A)  Final    >=100,000 COLONIES/mL ESCHERICHIA COLI Confirmed Extended Spectrum Beta-Lactamase Producer (ESBL).  In bloodstream infections from ESBL organisms, carbapenems are preferred over piperacillin/tazobactam. They are shown to have a lower risk of mortality.    Report Status 06/01/2019 FINAL  Final   Organism ID, Bacteria ESCHERICHIA COLI (A)  Final      Susceptibility   Escherichia coli - MIC*    AMPICILLIN >=32 RESISTANT Resistant     CEFAZOLIN >=64 RESISTANT Resistant     CEFTRIAXONE 32 RESISTANT Resistant     CIPROFLOXACIN >=4 RESISTANT Resistant     GENTAMICIN <=1 SENSITIVE Sensitive     IMIPENEM <=0.25 SENSITIVE Sensitive     NITROFURANTOIN <=16 SENSITIVE Sensitive      TRIMETH/SULFA >=320 RESISTANT Resistant     AMPICILLIN/SULBACTAM >=32 RESISTANT Resistant     PIP/TAZO 8 SENSITIVE Sensitive     * >=100,000 COLONIES/mL ESCHERICHIA COLI  Respiratory Panel by RT PCR (Flu A&B, Covid) - Nasopharyngeal Swab     Status: None   Collection Time: 05/30/19  9:59 AM   Specimen: Nasopharyngeal Swab  Result Value Ref Range Status   SARS Coronavirus 2 by RT PCR NEGATIVE NEGATIVE Final    Comment: (NOTE) SARS-CoV-2 target nucleic acids are NOT DETECTED. The SARS-CoV-2 RNA is generally detectable in upper respiratoy specimens during the acute phase of infection. The lowest concentration of SARS-CoV-2 viral copies this assay can detect is 131 copies/mL. A negative result does not preclude SARS-Cov-2 infection and should not be used as the sole basis for treatment or other patient management decisions. A negative result may occur with  improper specimen collection/handling, submission of specimen other than nasopharyngeal swab, presence of viral mutation(s) within the areas targeted by this assay, and inadequate number of viral copies (<131 copies/mL). A negative result must be combined with clinical observations, patient history, and epidemiological information. The expected result is Negative. Fact Sheet for Patients:  PinkCheek.be Fact Sheet for Healthcare Providers:  GravelBags.it This test is not yet ap proved or cleared by the [place] FDA and  has been authorized for detection and/or diagnosis of SARS-CoV-2 by FDA under an Emergency Use Authorization (EUA). This EUA will remain  in effect (meaning this test can be used) for the duration of the COVID-19 declaration under Section 564(b)(1) of the Act, 21 U.S.C. section 360bbb-3(b)(1), unless the authorization is terminated or revoked sooner.    Influenza A by PCR NEGATIVE NEGATIVE Final   Influenza B by PCR NEGATIVE NEGATIVE Final    Comment:  (NOTE) The Xpert Xpress SARS-CoV-2/FLU/RSV assay is intended as an aid in  the diagnosis of influenza from Nasopharyngeal swab specimens and  should not be used as a  sole basis for treatment. Nasal washings and  aspirates are unacceptable for Xpert Xpress SARS-CoV-2/FLU/RSV  testing. Fact Sheet for Patients: PinkCheek.be Fact Sheet for Healthcare Providers: GravelBags.it This test is not yet approved or cleared by the [place] FDA and  has been authorized for detection and/or diagnosis of SARS-CoV-2 by  FDA under an Emergency Use Authorization (EUA). This EUA will remain  in effect (meaning this test can be used) for the duration of the  Covid-19 declaration under Section 564(b)(1) of the Act, 21  U.S.C. section 360bbb-3(b)(1), unless the authorization is  terminated or revoked. Performed at [facility], [place] [address] [place], [place] [address]   Blood Culture (routine x 2)     Status: Abnormal   Collection Time: 05/30/19 10:00 AM   Specimen: BLOOD  Result Value Ref Range Status   Specimen Description BLOOD LEFT ANTECUBITAL  Final   Special Requests   Final    BOTTLES DRAWN AEROBIC AND ANAEROBIC Blood Culture adequate volume   Culture  Setup Time   Final    GRAM NEGATIVE RODS IN BOTH AEROBIC AND ANAEROBIC BOTTLES CRITICAL RESULT CALLED TO, READ BACK BY AND VERIFIED WITH: PHRMD K AMEND @0153  05/31/19 BY S GEZAHEGN Performed at [facility], [place] [address] [place], [place] [address]    Culture (A)  Final    ESCHERICHIA COLI Confirmed Extended Spectrum Beta-Lactamase Producer (ESBL).  In bloodstream infections from ESBL organisms, carbapenems are preferred over piperacillin/tazobactam. They are shown to have a lower risk of mortality.    Report Status 06/02/2019 FINAL  Final   Organism ID, Bacteria ESCHERICHIA COLI  Final      Susceptibility   Escherichia coli - MIC*    AMPICILLIN >=32 RESISTANT  Resistant     CEFAZOLIN >=64 RESISTANT Resistant     CEFEPIME 1 SENSITIVE Sensitive     CEFTAZIDIME RESISTANT Resistant     CEFTRIAXONE 32 RESISTANT Resistant     CIPROFLOXACIN >=4 RESISTANT Resistant     GENTAMICIN <=1 SENSITIVE Sensitive     IMIPENEM <=0.25 SENSITIVE Sensitive     TRIMETH/SULFA >=320 RESISTANT Resistant     AMPICILLIN/SULBACTAM >=32 RESISTANT Resistant     PIP/TAZO 8 SENSITIVE Sensitive     * ESCHERICHIA COLI  Blood Culture ID Panel (Reflexed)     Status: Abnormal   Collection Time: 05/30/19 10:00 AM  Result Value Ref Range Status   Enterococcus species NOT DETECTED NOT DETECTED Final   Listeria monocytogenes NOT DETECTED NOT DETECTED Final   Staphylococcus species NOT DETECTED NOT DETECTED Final   Staphylococcus aureus (BCID) NOT DETECTED NOT DETECTED Final   Streptococcus species NOT DETECTED NOT DETECTED Final   Streptococcus agalactiae NOT DETECTED NOT DETECTED Final   Streptococcus pneumoniae NOT DETECTED NOT DETECTED Final   Streptococcus pyogenes NOT DETECTED NOT DETECTED Final   Acinetobacter baumannii NOT DETECTED NOT DETECTED Final   Enterobacteriaceae species DETECTED (A) NOT DETECTED Final    Comment: Enterobacteriaceae represent a large family of gram-negative bacteria, not a single organism. CRITICAL RESULT CALLED TO, READ BACK BY AND VERIFIED WITH: PHRMD K AMEND @0153  05/31/19 BY S GEZAHEGN    Enterobacter cloacae complex NOT DETECTED NOT DETECTED Final   Escherichia coli DETECTED (A) NOT DETECTED Final    Comment: CRITICAL RESULT CALLED TO, READ BACK BY AND VERIFIED WITH: PHRMD K AMEND @0153  05/31/19 BY S GEZAHEGN    Klebsiella oxytoca NOT DETECTED NOT DETECTED Final   Klebsiella pneumoniae NOT DETECTED NOT DETECTED Final  Proteus species NOT DETECTED NOT DETECTED Final   Serratia marcescens NOT DETECTED NOT DETECTED Final   Carbapenem resistance NOT DETECTED NOT DETECTED Final   Haemophilus influenzae NOT DETECTED NOT DETECTED Final   Neisseria  meningitidis NOT DETECTED NOT DETECTED Final   Pseudomonas aeruginosa NOT DETECTED NOT DETECTED Final   Candida albicans NOT DETECTED NOT DETECTED Final   Candida glabrata NOT DETECTED NOT DETECTED Final   Candida krusei NOT DETECTED NOT DETECTED Final   Candida parapsilosis NOT DETECTED NOT DETECTED Final   Candida tropicalis NOT DETECTED NOT DETECTED Final    Comment: Performed at [facility], [place] [address], [place], [place] [address]  MRSA PCR Screening     Status: None   Collection Time: 05/30/19  4:19 PM   Specimen: Nasal Mucosa; Nasopharyngeal  Result Value Ref Range Status   MRSA by PCR NEGATIVE NEGATIVE Final    Comment:        The GeneXpert MRSA Assay (FDA approved for NASAL specimens only), is one component of a comprehensive MRSA colonization surveillance program. It is not intended to diagnose MRSA infection nor to guide or monitor treatment for MRSA infections. Performed at [facility], [place] [address] [place], [place] [address]   C Difficile Quick Screen w PCR reflex     Status: None   Collection Time: 06/07/19 12:39 PM   Specimen: STOOL  Result Value Ref Range Status   C Diff antigen NEGATIVE NEGATIVE Final   C Diff toxin NEGATIVE NEGATIVE Final   C Diff interpretation No C. difficile detected.  Final    Comment: Performed at [facility], [place] [address], [place], [place] [address]         Radiology Studies: DG Chest 2 View  Result Date: 06/06/2019 CLINICAL DATA:  Pneumonia. EXAM: CHEST - 2 VIEW COMPARISON:  June 06, 2019. FINDINGS: The heart size and mediastinal contours are within normal limits. No pneumothorax is noted. Increased interstitial densities are noted in both lung bases concerning for pulmonary edema or possibly infiltrates and probable small bilateral pleural effusions. The visualized skeletal structures are unremarkable. IMPRESSION: Increased bibasilar interstitial densities are noted concerning for pulmonary edema or  possibly infiltrates and probable small bilateral pleural effusions. Electronically Signed   By: Marijo Conception M.D.   On: 06/06/2019 17:37        Scheduled Meds: . aspirin EC  81 mg Oral Daily  . budesonide  0.25 mg Inhalation BID  . Chlorhexidine Gluconate Cloth  6 each Topical Daily  . DULoxetine  30 mg Oral Daily  . furosemide  40 mg Intravenous BID  . gabapentin  100 mg Oral BID  . heparin  5,000 Units Subcutaneous Q8H  . insulin aspart  0-15 Units Subcutaneous TID WC  . insulin aspart  4 Units Subcutaneous TID WC  . insulin detemir  12 Units Subcutaneous QHS  . latanoprost  1 drop Both Eyes QHS  . levothyroxine  25 mcg Oral Q0600  . magnesium oxide  800 mg Oral BID  . mouth rinse  15 mL Mouth Rinse BID  . metoprolol tartrate  25 mg Oral BID  . potassium chloride  40 mEq Oral BID  . sodium chloride flush  10-40 mL Intracatheter Q12H  . timolol  1 drop Both Eyes Daily   Continuous Infusions: . sodium chloride Stopped (05/31/19 1946)     LOS: 9 days   The patient is critically ill with multiple organ systems failure and  requires high complexity decision making for assessment and support, frequent evaluation and titration of therapies, application of advanced monitoring technologies and extensive interpretation of multiple databases. Critical Care Time devoted to patient care services described in this note  Time spent: 40 minutes     Daeja Helderman, Geraldo Docker, MD [facility] Pager [phone]  If 7PM-7AM, please contact night-coverage [email or web address] Password [email or web address] 06/08/2019, 8:30 AM

## 2019-06-09 ENCOUNTER — Inpatient Hospital Stay (HOSPITAL_COMMUNITY): Payer: Medicare Other

## 2019-06-09 DIAGNOSIS — A419 Sepsis, unspecified organism: Secondary | ICD-10-CM | POA: Diagnosis not present

## 2019-06-09 DIAGNOSIS — N3000 Acute cystitis without hematuria: Secondary | ICD-10-CM | POA: Diagnosis not present

## 2019-06-09 DIAGNOSIS — R7881 Bacteremia: Secondary | ICD-10-CM | POA: Diagnosis not present

## 2019-06-09 DIAGNOSIS — N179 Acute kidney failure, unspecified: Secondary | ICD-10-CM | POA: Diagnosis not present

## 2019-06-09 LAB — COMPREHENSIVE METABOLIC PANEL
ALT: 36 U/L (ref 0–44)
AST: 35 U/L (ref 15–41)
Albumin: 3.2 g/dL — ABNORMAL LOW (ref 3.5–5.0)
Alkaline Phosphatase: 81 U/L (ref 38–126)
Anion gap: 14 (ref 5–15)
BUN: 17 mg/dL (ref 8–23)
CO2: 24 mmol/L (ref 22–32)
Calcium: 9.6 mg/dL (ref 8.9–10.3)
Chloride: 95 mmol/L — ABNORMAL LOW (ref 98–111)
Creatinine, Ser: 0.8 mg/dL (ref 0.44–1.00)
GFR calc Af Amer: >60 mL/min (ref >60)
GFR calc non Af Amer: >60 mL/min (ref >60)
Glucose, Bld: 158 mg/dL — ABNORMAL HIGH (ref 70–99)
Potassium: 5.3 mmol/L — ABNORMAL HIGH (ref 3.5–5.1)
Sodium: 133 mmol/L — ABNORMAL LOW (ref 135–145)
Total Bilirubin: 1 mg/dL (ref 0.3–1.2)
Total Protein: 6.7 g/dL (ref 6.5–8.1)

## 2019-06-09 LAB — CBC WITH DIFFERENTIAL/PLATELET
Abs Immature Granulocytes: 0.15 10*3/uL — ABNORMAL HIGH (ref 0.00–0.07)
Basophils Absolute: 0.1 10*3/uL (ref 0.0–0.1)
Basophils Relative: 0 %
Eosinophils Absolute: 0.2 10*3/uL (ref 0.0–0.5)
Eosinophils Relative: 2 %
HCT: 35.6 % — ABNORMAL LOW (ref 36.0–46.0)
Hemoglobin: 11.2 g/dL — ABNORMAL LOW (ref 12.0–15.0)
Immature Granulocytes: 1 %
Lymphocytes Relative: 17 %
Lymphs Abs: 1.9 10*3/uL (ref 0.7–4.0)
MCH: 23.4 pg — ABNORMAL LOW (ref 26.0–34.0)
MCHC: 31.5 g/dL (ref 30.0–36.0)
MCV: 74.5 fL — ABNORMAL LOW (ref 80.0–100.0)
Monocytes Absolute: 0.9 10*3/uL (ref 0.1–1.0)
Monocytes Relative: 8 %
Neutro Abs: 8.1 10*3/uL — ABNORMAL HIGH (ref 1.7–7.7)
Neutrophils Relative %: 72 %
Platelets: 519 10*3/uL — ABNORMAL HIGH (ref 150–400)
RBC: 4.78 MIL/uL (ref 3.87–5.11)
RDW: 21.8 % — ABNORMAL HIGH (ref 11.5–15.5)
WBC: 11 10*3/uL — ABNORMAL HIGH (ref 4.0–10.5)
nRBC: 0 % (ref 0.0–0.2)

## 2019-06-09 LAB — GLUCOSE, CAPILLARY
Glucose-Capillary: 186 mg/dL — ABNORMAL HIGH (ref 70–99)
Glucose-Capillary: 302 mg/dL — ABNORMAL HIGH (ref 70–99)
Glucose-Capillary: 276 mg/dL — ABNORMAL HIGH (ref 70–99)
Glucose-Capillary: 121 mg/dL — ABNORMAL HIGH (ref 70–99)
Glucose-Capillary: 187 mg/dL — ABNORMAL HIGH (ref 70–99)

## 2019-06-09 LAB — MAGNESIUM: Magnesium: 1.8 mg/dL (ref 1.7–2.4)

## 2019-06-09 LAB — PROCALCITONIN: Procalcitonin: 0.23 ng/mL

## 2019-06-09 LAB — PHOSPHORUS: Phosphorus: 4.6 mg/dL (ref 2.5–4.6)

## 2019-06-09 MED ORDER — ATORVASTATIN CALCIUM 10 MG PO TABS
10.0000 mg | ORAL_TABLET | Freq: Every day | ORAL | Status: DC
  Administered 2019-06-09 – 2019-06-12 (×4): 10 mg via ORAL
  Filled 2019-06-09 (×4): qty 1

## 2019-06-09 MED ORDER — PANTOPRAZOLE SODIUM 40 MG PO TBEC
40.0000 mg | DELAYED_RELEASE_TABLET | Freq: Every day | ORAL | Status: DC
  Administered 2019-06-09 – 2019-06-12 (×4): 40 mg via ORAL
  Filled 2019-06-09 (×4): qty 1

## 2019-06-09 MED ORDER — INSULIN ASPART 100 UNIT/ML ~~LOC~~ SOLN
8.0000 [IU] | Freq: Three times a day (TID) | SUBCUTANEOUS | Status: DC
  Administered 2019-06-09 – 2019-06-12 (×9): 8 [IU] via SUBCUTANEOUS

## 2019-06-09 MED ORDER — INSULIN DETEMIR 100 UNIT/ML ~~LOC~~ SOLN
20.0000 [IU] | Freq: Every day | SUBCUTANEOUS | Status: DC
  Administered 2019-06-09 – 2019-06-11 (×3): 20 [IU] via SUBCUTANEOUS
  Filled 2019-06-09 (×5): qty 0.2

## 2019-06-09 NOTE — Progress Notes (Signed)
Physical Therapy Treatment Patient Details Name: Mary Flynn MRN: [id number] DOB: [birth year] Today's Date: 06/09/2019    History of Present Illness Pt is an 85 y/o female with h/o DM and HTN with recent dx of UTI.  She apparently misunderstood when to take the Cipro and by day of admission had AMS.  Work up for worsening UTI/sepsis and acute hypoxic respiratory failure.    PT Comments    Pt very pleasant sitting in chair visiting with spouse and daughter on arrival. Pt able to progress ambulation distance today with cues for breathing technique and energy conservation to maintain SpO2. SpO2 on arrival on 1L at 99% with sats dropping to 87% during gait but able to quickly return to >90% when cues for not talking and walking and frequent standing pauses are utilized. When pt adheres to cues SpO2 >90% on RA with gait. Pt 96% on RA at rest end of session  HR 82    Follow Up Recommendations  Home health PT;Supervision/Assistance - 24 hour     Equipment Recommendations  Rolling walker with 5" wheels    Recommendations for Other Services       Precautions / Restrictions Precautions Precautions: Fall Precaution Comments: monitor SPO2    Mobility  Bed Mobility   Bed Mobility: Sit to Supine     Supine to sit: Modified independent (Device/Increase time)        Transfers Overall transfer level: Needs assistance   Transfers: Sit to/from Stand Sit to Stand: Supervision         General transfer comment: supervision for lines only  Ambulation/Gait Ambulation/Gait assistance: Supervision Gait Distance (Feet): 500 Feet Assistive device: Rolling walker (2 wheeled) Gait Pattern/deviations: Step-through pattern;Decreased stride length;Drifts right/left   Gait velocity interpretation: >2.62 ft/sec, indicative of community ambulatory General Gait Details: cues to maintain proximity to RW and for safety with RW management in hallway. cues for pursed lip breathing and not talking  while walking. Without talking and pauses in gait every 75' pt able to maintain sats >90%   Stairs             Wheelchair Mobility    Modified Rankin (Stroke Patients Only)       Balance Overall balance assessment: Needs assistance   Sitting balance-[name]: Good       Standing balance-[name]: Fair Standing balance comment: pt able to stand without assist, use of rW for gait                            Cognition Arousal/Alertness: Awake/alert Behavior During Therapy: WFL for tasks assessed/performed Overall Cognitive Status: Within Functional Limits for tasks assessed                                        Exercises      General Comments        Pertinent Vitals/Pain Pain Assessment: No/denies pain    Home Living                      Prior Function            PT Goals (current goals can now be found in the care plan section) Progress towards PT goals: Progressing toward goals    Frequency    Min 3X/week      PT Plan Current plan remains  appropriate    Co-evaluation              AM-PAC PT "6 Clicks" Mobility   Outcome Measure  Help needed turning from your back to your side while in a flat bed without using bedrails?: None Help needed moving from lying on your back to sitting on the side of a flat bed without using bedrails?: None Help needed moving to and from a bed to a chair (including a wheelchair)?: None Help needed standing up from a chair using your arms (e.g., wheelchair or bedside chair)?: None Help needed to walk in hospital room?: A Little Help needed climbing 3-5 steps with a railing? : A Little 6 Click Score: 22    End of Session   Activity Tolerance: Patient tolerated treatment well Patient left: with call bell/phone within reach;in bed;with family/visitor present Nurse Communication: Mobility status PT Visit Diagnosis: Other abnormalities of gait and mobility  (R26.89);Difficulty in walking, not elsewhere classified (R26.2)     Time: [id number] PT Time Calculation (min) (ACUTE ONLY): 23 min  Charges:  $Gait Training: 23-37 mins                     Bayard Males, PT Acute Rehabilitation Services Pager: [phone] Office: [place] 06/09/2019, 1:39 PM

## 2019-06-09 NOTE — Progress Notes (Signed)
PROGRESS NOTE    Mary Flynn  [id number] DOB: 22-May-1934 DOA: 05/30/2019 PCP: [name], NP   Brief Narrative:  85 year old BF PMHx Depression, Memory loss, DM type II uncontrolled with complication, peripheral neuropathy, mixed dyslipidemia, subclinical hypothyroidism, RIGHT thyroid nodule, essential HTN, chronic pain syndrome (LEFT rib fracture, compression fracture L2)    Patient was recently seen and treated for acute cystitis through the family medicine office, had UTI, treated with ciprofloxacin.  Medication never started  According to the paramedics who transported the patient to the hospital she was found with a change in her mental status this morning when someone was talking to her on the phone.  This was a neighbor, when the paramedics arrived they found the patient to be hypoxic, tachycardic, ill-appearing slightly confused and had a temperature over 99.9, the activated a code sepsis prehospital.  No antibiotics were given, oxygen was supplied by nonrebreather.  Further review of the medical record shows that the patient had a change in her medication to nitrofurantoin yesterday.  The patient does not have any history of pulmonary embolism, however she is a diabetic taking multiple different diet medications including Jardiance and Metformin   Subjective: 4/16 A/O x4, negative CP, positive S OB but improving. Resting comfortably in bed on O2.   Assessment & Plan:   Active Problems:   Septic shock (HCC)   AKI (acute kidney injury) ([place])   Severe sepsis with septic shock (HCC)   Bacteremia due to Escherichia coli   Pulmonary hypertension (HCC)   Demand ischemia (HCC)   Diabetes mellitus type 2, uncontrolled, with complications (HCC)  Septic shock positive ESBL bacteremia -Completed 7-day course of Meropenem   Acute respiratory failure with hypoxia -Multifactorial pulmonary hypertension, pulmonary edema. -Treat underlying conditions. -Titrate to  maintain SPO2> 94% -4/16 obtain ambulatory SPO2  CHF/pulmonary HTN -Dry weight 132 pounds (59.9 kg) per Dr. Sunny Schlein Revankar note from 05/10/2019 -Echocardiogram 4/7 showed hyperdynamic EF with moderate pulmonary HTN see results below. -Strict in and out -558.63ml -Daily weight  Filed Weights   06/07/19 0631 06/08/19 0323 06/09/19 0321  Weight: 57 kg 56 kg 56 kg  -Metoprolol 25 mg BID -4/14 Albumin 25 g x 1 -4/16 increase Lasix IV 40 mg TID  Tachycardia NOS -See CHF -Resolved   Demand ischemia secondary to severe sepsis -See echocardiogram below -Resolved  Acute kidney injury -Prior to admission normal creatinine -Continue to monitor creatinine level with diuresis Recent Labs  Lab 06/04/19 0444 06/05/19 0219 06/07/19 0627 06/08/19 0221 06/09/19 0146  CREATININE 0.70 0.74 0.75 0.80 0.80    C. difficile colitis -Patient now having episodes of diarrhea, C. difficile culture pending. -Metformin also discontinued as may be contributing to her diarrhea. -4/12 procalcitonin elevated= 1.42, will redraw in the A.m. Results for SAYLA, KALEN (MRN [id number]) as of 06/09/2019 08:10  Ref. Range 06/05/2019 09:21 06/08/2019 02:21 06/09/2019 01:46  Procalcitonin Latest Units: ng/mL 1.42 0.35 0.23  -4/14 C. difficile screen negative.  C. difficile RULED OUT  DM type II uncontrolled with complication -4/6 hemoglobin A1c= 8.2 -4/16 increase Levemir 20 units qhs -4/16 increase NovoLog 8 units qac -Moderate SSI  Hypothyroid -Synthroid 25 mcg daily -Recheck TSH in 4-6 weeks - GI prophylaxis -Protonix 40 mg daily  Prior cervicalgia On discharge discontinue amitriptyline 12.5, Flexeril 10--on beers criteria  HLD -Lipid panel pending -DC Niaspan 1000 CR -Lipitor 10 mg daily      DVT prophylaxis: Heparin subcu Code Status: Full Family Communication: 4/15 spoke with Dr. Janese Banks (  sister) discussed plan of care answered all questions Disposition Plan:  1.  Where the patient is from 2.   Anticipated d/c place. 3.  Barriers to d/c resolution pulmonary edema on IV Lasix. Have increased IV Lasix. Patient's diabetes still uncontrolled. Have increased DM medication   Consultants:  PCCM   Procedures/Significant Events:  4/6 CTA PE protocol; negative PE.-Bibasilar infiltrates RIGHT>>> LEFT with mild interstitial edema 4/7 Echocardiogram;Left Ventricle: LVEF= 70 to 75%. The left ventricle has hyperdynamic function.-no regional wall motion abnormalities. The left ventricular internal cavity size was normal in size.  -Moderate LVH Right Ventricle: Moderately elevated pulmonary artery systolic pressure.  estimated right ventricular systolic pressure is [id number] mmHg.  Venous: The inferior vena cava is dilated in size with less than 50% respiratory variability, suggesting right atrial pressure of 15 mmHg.  4/16 PCXR;No active disease    I have personally reviewed and interpreted all radiology studies and my findings are as above.  VENTILATOR SETTINGS: Nasal cannula 4/16 Flow; 1 L/min SPO2 90%   Cultures 4/6 SARS coronavirus negative 4/6 influenza A/B negative 4/6 blood left AC positive E. coli 4/6 urine positive E. Coli 4/6 MRSA by PCR negative 4/14 C. difficile antigen/toxin negative      Antimicrobials: Anti-infectives (From admission, onward)   Start     Ordered Stop   06/05/19 1000  meropenem (MERREM) 1 g in sodium chloride 0.9 % 100 mL IVPB     06/05/19 0849 06/05/19 2228   06/04/19 1000  meropenem (MERREM) 1 g in sodium chloride 0.9 % 100 mL IVPB     06/04/19 0713 06/04/19 2325   05/31/19 0200  meropenem (MERREM) 1 g in sodium chloride 0.9 % 100 mL IVPB  Status:  Discontinued     05/30/19 1252 06/04/19 0713   05/30/19 1300  meropenem (MERREM) 1 g in sodium chloride 0.9 % 100 mL IVPB     05/30/19 1251 05/30/19 1413   05/30/19 0945  levofloxacin (LEVAQUIN) IVPB 750 mg  Status:  Discontinued     05/30/19 0933 05/30/19 0937   05/30/19 0945  cefTRIAXone  (ROCEPHIN) 1 g in sodium chloride 0.9 % 100 mL IVPB     05/30/19 0937 05/30/19 1108   05/30/19 0945  azithromycin (ZITHROMAX) 500 mg in sodium chloride 0.9 % 250 mL IVPB     05/30/19 0937 05/30/19 1026       Devices    LINES / TUBES:      Continuous Infusions: . sodium chloride Stopped (05/31/19 1946)     Objective: Vitals:   06/08/19 1557 06/08/19 1951 06/08/19 2329 06/09/19 0321  BP: 114/75 (!) 141/66 117/62 (!) 133/57  Pulse: 95 94 90 93  Resp: (!) 23 (!) 23 (!) 22 (!) 24  Temp:  (!) 97 F (36.1 C) 98.2 F (36.8 C) 98.2 F (36.8 C)  TempSrc:  Axillary Oral Oral  SpO2: 92% 97% 92% 93%  Weight:    56 kg  Height:        Intake/Output Summary (Last 24 hours) at 06/09/2019 0818 Last data filed at 06/08/2019 2234 Gross per 24 hour  Intake 733 ml  Output 200 ml  Net 533 ml   Filed Weights   06/07/19 0631 06/08/19 0323 06/09/19 0321  Weight: 57 kg 56 kg 56 kg   Physical Exam:  General: A/O x4, positive acute respiratory distress Eyes: negative scleral hemorrhage, negative anisocoria, negative icterus ENT: Negative Runny nose, negative gingival bleeding, Neck:  Negative scars, masses, torticollis, lymphadenopathy, JVD Lungs: Clear  to auscultation bilaterally, except for mild crackles RLL. Cardiovascular: Regular rate and rhythm without murmur gallop or rub normal S1 and S2 Abdomen: negative abdominal pain, nondistended, positive soft, bowel sounds, no rebound, no ascites, no appreciable mass Extremities: No significant cyanosis, clubbing, or edema bilateral lower extremities Skin: Negative rashes, lesions, ulcers Psychiatric:  Negative depression, negative anxiety, negative fatigue, negative mania  Central nervous system:  Cranial nerves II through XII intact, tongue/uvula midline, all extremities muscle strength 5/5, sensation intact throughout, negative dysarthria, negative expressive aphasia, negative receptive aphasia.  .     Data Reviewed: Care during  the described time interval was provided by me .  I have reviewed this patient's available data, including medical history, events of note, physical examination, and all test results as part of my evaluation.   CBC: Recent Labs  Lab 06/05/19 0219 06/06/19 0244 06/07/19 0928 06/08/19 0221 06/09/19 0146  WBC 10.1 13.3* 11.7* 11.4* 11.0*  NEUTROABS 6.1 9.3* 8.9* 7.8* 8.1*  HGB 10.2* 10.7* 10.6* 10.7* 11.2*  HCT 32.6* 34.3* 34.4* 34.2* 35.6*  MCV 72.8* 73.9* 75.6* 74.8* 74.5*  PLT 353 409* 450* 470* [id number]*   Basic Metabolic Panel: Recent Labs  Lab 06/02/19 0819 06/02/19 0819 06/03/19 0629 06/03/19 0629 06/04/19 0444 06/05/19 0219 06/07/19 0627 06/08/19 0221 06/09/19 0146  NA 139   < > 142   < > 139 137 136 133* 133*  K 3.5   < > 3.3*   < > 4.4 4.3 4.9 4.4 5.3*  CL 104   < > 108   < > 106 102 100 94* 95*  CO2 20*   < > 22   < > 24 23 25 26 24   GLUCOSE 155*   < > 103*   < > 160* 184* 167* 169* 158*  BUN 14   < > 10   < > 9 9 11 13 17   CREATININE 0.67   < > 0.65   < > 0.70 0.74 0.75 0.80 0.80  CALCIUM 8.5*   < > 8.1*   < > 8.0* 8.5* 9.5 9.8 9.6  MG 1.8  --  1.6*  --  1.7  --   --  1.8 1.8  PHOS 2.4*  --  3.4  --   --   --   --  5.2* 4.6   < > = values in this interval not displayed.   GFR: Estimated Creatinine Clearance: 40.1 mL/min (by C-G formula based on SCr of 0.8 mg/dL). Liver Function Tests: Recent Labs  Lab 06/04/19 0444 06/05/19 0219 06/07/19 0627 06/08/19 0221 06/09/19 0146  AST 34 33 36 27 35  ALT 52* 43 40 33 36  ALKPHOS 79 83 83 83 81  BILITOT 0.9 1.6* 1.2 1.4* 1.0  PROT 5.3* 6.0* 6.1* 6.8 6.7  ALBUMIN 2.2* 2.4* 2.6* 3.3* 3.2*   No results for input(s): LIPASE, AMYLASE in the last 168 hours. No results for input(s): AMMONIA in the last 168 hours. Coagulation Profile: No results for input(s): INR, PROTIME in the last 168 hours. Cardiac Enzymes: No results for input(s): CKTOTAL, CKMB, CKMBINDEX, TROPONINI in the last 168 hours. BNP (last 3  results) Recent Labs    08/08/18 1507  PROBNP 86.0   HbA1C: No results for input(s): HGBA1C in the last 72 hours. CBG: Recent Labs  Lab 06/08/19 0620 06/08/19 0928 06/08/19 1616 06/08/19 2118 06/09/19 0605  GLUCAP 170* 239* 125* 242* 186*   Lipid Profile: No results for input(s): CHOL, HDL, LDLCALC, TRIG, CHOLHDL,  LDLDIRECT in the last 72 hours. Thyroid Function Tests: No results for input(s): TSH, T4TOTAL, FREET4, T3FREE, THYROIDAB in the last 72 hours. Anemia Panel: No results for input(s): VITAMINB12, FOLATE, FERRITIN, TIBC, IRON, RETICCTPCT in the last 72 hours. Urine analysis:    Component Value Date/Time   COLORURINE YELLOW 05/30/2019 1053   APPEARANCEUR HAZY (A) 05/30/2019 1053   LABSPEC 1.017 05/30/2019 1053   PHURINE 5.0 05/30/2019 1053   GLUCOSEU >=500 (A) 05/30/2019 1053   GLUCOSEU 500 (A) 07/08/2018 0814   HGBUR SMALL (A) 05/30/2019 1053   HGBUR negative 04/30/2010 0816   BILIRUBINUR NEGATIVE 05/30/2019 1053   BILIRUBINUR neg 05/24/2019 1326   KETONESUR 20 (A) 05/30/2019 1053   PROTEINUR 30 (A) 05/30/2019 1053   UROBILINOGEN negative (A) 05/24/2019 1326   UROBILINOGEN 0.2 07/08/2018 0814   NITRITE NEGATIVE 05/30/2019 1053   LEUKOCYTESUR TRACE (A) 05/30/2019 1053   Sepsis Labs: @LABRCNTIP (procalcitonin:4,lacticidven:4)  ) Recent Results (from the past 240 hour(s))  Urine culture     Status: Abnormal   Collection Time: 05/30/19  9:32 AM   Specimen: In/Out Cath Urine  Result Value Ref Range Status   Specimen Description IN/OUT CATH URINE  Final   Special Requests   Final    NONE Performed at [facility], [place] [address] [place], [place] [address]    Culture (A)  Final    >=100,000 COLONIES/mL ESCHERICHIA COLI Confirmed Extended Spectrum Beta-Lactamase Producer (ESBL).  In bloodstream infections from ESBL organisms, carbapenems are preferred over piperacillin/tazobactam. They are shown to have a lower risk of mortality.    Report Status  06/01/2019 FINAL  Final   Organism ID, Bacteria ESCHERICHIA COLI (A)  Final      Susceptibility   Escherichia coli - MIC*    AMPICILLIN >=32 RESISTANT Resistant     CEFAZOLIN >=64 RESISTANT Resistant     CEFTRIAXONE 32 RESISTANT Resistant     CIPROFLOXACIN >=4 RESISTANT Resistant     GENTAMICIN <=1 SENSITIVE Sensitive     IMIPENEM <=0.25 SENSITIVE Sensitive     NITROFURANTOIN <=16 SENSITIVE Sensitive     TRIMETH/SULFA >=320 RESISTANT Resistant     AMPICILLIN/SULBACTAM >=32 RESISTANT Resistant     PIP/TAZO 8 SENSITIVE Sensitive     * >=100,000 COLONIES/mL ESCHERICHIA COLI  Respiratory Panel by RT PCR (Flu A&B, Covid) - Nasopharyngeal Swab     Status: None   Collection Time: 05/30/19  9:59 AM   Specimen: Nasopharyngeal Swab  Result Value Ref Range Status   SARS Coronavirus 2 by RT PCR NEGATIVE NEGATIVE Final    Comment: (NOTE) SARS-CoV-2 target nucleic acids are NOT DETECTED. The SARS-CoV-2 RNA is generally detectable in upper respiratoy specimens during the acute phase of infection. The lowest concentration of SARS-CoV-2 viral copies this assay can detect is 131 copies/mL. A negative result does not preclude SARS-Cov-2 infection and should not be used as the sole basis for treatment or other patient management decisions. A negative result may occur with  improper specimen collection/handling, submission of specimen other than nasopharyngeal swab, presence of viral mutation(s) within the areas targeted by this assay, and inadequate number of viral copies (<131 copies/mL). A negative result must be combined with clinical observations, patient history, and epidemiological information. The expected result is Negative. Fact Sheet for Patients:  PinkCheek.be Fact Sheet for Healthcare Providers:  GravelBags.it This test is not yet ap proved or cleared by the [place] FDA and  has been authorized for detection and/or  diagnosis of SARS-CoV-2 by FDA  under an Emergency Use Authorization (EUA). This EUA will remain  in effect (meaning this test can be used) for the duration of the COVID-19 declaration under Section 564(b)(1) of the Act, 21 U.S.C. section 360bbb-3(b)(1), unless the authorization is terminated or revoked sooner.    Influenza A by PCR NEGATIVE NEGATIVE Final   Influenza B by PCR NEGATIVE NEGATIVE Final    Comment: (NOTE) The Xpert Xpress SARS-CoV-2/FLU/RSV assay is intended as an aid in  the diagnosis of influenza from Nasopharyngeal swab specimens and  should not be used as a sole basis for treatment. Nasal washings and  aspirates are unacceptable for Xpert Xpress SARS-CoV-2/FLU/RSV  testing. Fact Sheet for Patients: PinkCheek.be Fact Sheet for Healthcare Providers: GravelBags.it This test is not yet approved or cleared by the [place] FDA and  has been authorized for detection and/or diagnosis of SARS-CoV-2 by  FDA under an Emergency Use Authorization (EUA). This EUA will remain  in effect (meaning this test can be used) for the duration of the  Covid-19 declaration under Section 564(b)(1) of the Act, 21  U.S.C. section 360bbb-3(b)(1), unless the authorization is  terminated or revoked. Performed at [facility], [place] [address], [place], [place] [address]   Blood Culture (routine x 2)     Status: Abnormal   Collection Time: 05/30/19 10:00 AM   Specimen: BLOOD  Result Value Ref Range Status   Specimen Description BLOOD LEFT ANTECUBITAL  Final   Special Requests   Final    BOTTLES DRAWN AEROBIC AND ANAEROBIC Blood Culture adequate volume   Culture  Setup Time   Final    GRAM NEGATIVE RODS IN BOTH AEROBIC AND ANAEROBIC BOTTLES CRITICAL RESULT CALLED TO, READ BACK BY AND VERIFIED WITH: PHRMD K AMEND @0153  05/31/19 BY S GEZAHEGN Performed at [facility], [place] [address], [place], [place]  [address]    Culture  (A)  Final    ESCHERICHIA COLI Confirmed Extended Spectrum Beta-Lactamase Producer (ESBL).  In bloodstream infections from ESBL organisms, carbapenems are preferred over piperacillin/tazobactam. They are shown to have a lower risk of mortality.    Report Status 06/02/2019 FINAL  Final   Organism ID, Bacteria ESCHERICHIA COLI  Final      Susceptibility   Escherichia coli - MIC*    AMPICILLIN >=32 RESISTANT Resistant     CEFAZOLIN >=64 RESISTANT Resistant     CEFEPIME 1 SENSITIVE Sensitive     CEFTAZIDIME RESISTANT Resistant     CEFTRIAXONE 32 RESISTANT Resistant     CIPROFLOXACIN >=4 RESISTANT Resistant     GENTAMICIN <=1 SENSITIVE Sensitive     IMIPENEM <=0.25 SENSITIVE Sensitive     TRIMETH/SULFA >=320 RESISTANT Resistant     AMPICILLIN/SULBACTAM >=32 RESISTANT Resistant     PIP/TAZO 8 SENSITIVE Sensitive     * ESCHERICHIA COLI  Blood Culture ID Panel (Reflexed)     Status: Abnormal   Collection Time: 05/30/19 10:00 AM  Result Value Ref Range Status   Enterococcus species NOT DETECTED NOT DETECTED Final   Listeria monocytogenes NOT DETECTED NOT DETECTED Final   Staphylococcus species NOT DETECTED NOT DETECTED Final   Staphylococcus aureus (BCID) NOT DETECTED NOT DETECTED Final   Streptococcus species NOT DETECTED NOT DETECTED Final   Streptococcus agalactiae NOT DETECTED NOT DETECTED Final   Streptococcus pneumoniae NOT DETECTED NOT DETECTED Final   Streptococcus pyogenes NOT DETECTED NOT DETECTED Final   Acinetobacter baumannii NOT DETECTED NOT DETECTED Final   Enterobacteriaceae species DETECTED (A) NOT DETECTED Final  Comment: Enterobacteriaceae represent a large family of gram-negative bacteria, not a single organism. CRITICAL RESULT CALLED TO, READ BACK BY AND VERIFIED WITH: PHRMD K AMEND @0153  05/31/19 BY S GEZAHEGN    Enterobacter cloacae complex NOT DETECTED NOT DETECTED Final   Escherichia coli DETECTED (A) NOT DETECTED Final    Comment: CRITICAL RESULT CALLED TO,  READ BACK BY AND VERIFIED WITH: PHRMD K AMEND @0153  05/31/19 BY S GEZAHEGN    Klebsiella oxytoca NOT DETECTED NOT DETECTED Final   Klebsiella pneumoniae NOT DETECTED NOT DETECTED Final   Proteus species NOT DETECTED NOT DETECTED Final   Serratia marcescens NOT DETECTED NOT DETECTED Final   Carbapenem resistance NOT DETECTED NOT DETECTED Final   Haemophilus influenzae NOT DETECTED NOT DETECTED Final   Neisseria meningitidis NOT DETECTED NOT DETECTED Final   Pseudomonas aeruginosa NOT DETECTED NOT DETECTED Final   Candida albicans NOT DETECTED NOT DETECTED Final   Candida glabrata NOT DETECTED NOT DETECTED Final   Candida krusei NOT DETECTED NOT DETECTED Final   Candida parapsilosis NOT DETECTED NOT DETECTED Final   Candida tropicalis NOT DETECTED NOT DETECTED Final    Comment: Performed at [facility], [place] [address], [place], [place] [address]  MRSA PCR Screening     Status: None   Collection Time: 05/30/19  4:19 PM   Specimen: Nasal Mucosa; Nasopharyngeal  Result Value Ref Range Status   MRSA by PCR NEGATIVE NEGATIVE Final    Comment:        The GeneXpert MRSA Assay (FDA approved for NASAL specimens only), is one component of a comprehensive MRSA colonization surveillance program. It is not intended to diagnose MRSA infection nor to guide or monitor treatment for MRSA infections. Performed at [facility], [place] [address], [place], [place] [address]   C Difficile Quick Screen w PCR reflex     Status: None   Collection Time: 06/07/19 12:39 PM   Specimen: STOOL  Result Value Ref Range Status   C Diff antigen NEGATIVE NEGATIVE Final   C Diff toxin NEGATIVE NEGATIVE Final   C Diff interpretation No C. difficile detected.  Final    Comment: Performed at [facility], [place] [address] [place], [place] [address]         Radiology Studies: No results found.      Scheduled Meds: . aspirin EC  81 mg Oral Daily  . budesonide  0.25 mg Inhalation BID  .  Chlorhexidine Gluconate Cloth  6 each Topical Daily  . DULoxetine  30 mg Oral Daily  . furosemide  40 mg Intravenous BID  . gabapentin  100 mg Oral BID  . heparin  5,000 Units Subcutaneous Q8H  . insulin aspart  0-15 Units Subcutaneous TID WC  . insulin aspart  4 Units Subcutaneous TID WC  . insulin detemir  12 Units Subcutaneous QHS  . latanoprost  1 drop Both Eyes QHS  . levothyroxine  25 mcg Oral Q0600  . magnesium oxide  800 mg Oral BID  . mouth rinse  15 mL Mouth Rinse BID  . metoprolol tartrate  25 mg Oral BID  . potassium chloride  40 mEq Oral BID  . sodium chloride flush  10-40 mL Intracatheter Q12H  . timolol  1 drop Both Eyes Daily   Continuous Infusions: . sodium chloride Stopped (05/31/19 1946)     LOS: 10 days   The patient is critically ill with multiple organ systems failure and requires high complexity decision making  for assessment and support, frequent evaluation and titration of therapies, application of advanced monitoring technologies and extensive interpretation of multiple databases. Critical Care Time devoted to patient care services described in this note  Time spent: 40 minutes     Dayvian Blixt, Geraldo Docker, MD [facility] Pager [phone]  If 7PM-7AM, please contact night-coverage [email or web address] Password [facility] 06/09/2019, 8:18 AM

## 2019-06-09 NOTE — Plan of Care (Signed)

## 2019-06-10 DIAGNOSIS — N3000 Acute cystitis without hematuria: Secondary | ICD-10-CM | POA: Diagnosis not present

## 2019-06-10 DIAGNOSIS — N179 Acute kidney failure, unspecified: Secondary | ICD-10-CM | POA: Diagnosis not present

## 2019-06-10 DIAGNOSIS — R7881 Bacteremia: Secondary | ICD-10-CM | POA: Diagnosis not present

## 2019-06-10 DIAGNOSIS — A419 Sepsis, unspecified organism: Secondary | ICD-10-CM | POA: Diagnosis not present

## 2019-06-10 LAB — COMPREHENSIVE METABOLIC PANEL
ALT: 33 U/L (ref 0–44)
AST: 30 U/L (ref 15–41)
Albumin: 3.2 g/dL — ABNORMAL LOW (ref 3.5–5.0)
Alkaline Phosphatase: 78 U/L (ref 38–126)
Anion gap: 14 (ref 5–15)
BUN: 21 mg/dL (ref 8–23)
CO2: 24 mmol/L (ref 22–32)
Calcium: 9.4 mg/dL (ref 8.9–10.3)
Chloride: 98 mmol/L (ref 98–111)
Creatinine, Ser: 0.97 mg/dL (ref 0.44–1.00)
GFR calc Af Amer: >60 mL/min (ref >60)
GFR calc non Af Amer: 54 mL/min — ABNORMAL LOW (ref >60)
Glucose, Bld: 166 mg/dL — ABNORMAL HIGH (ref 70–99)
Potassium: 4.6 mmol/L (ref 3.5–5.1)
Sodium: 136 mmol/L (ref 135–145)
Total Bilirubin: 1.1 mg/dL (ref 0.3–1.2)
Total Protein: 6.7 g/dL (ref 6.5–8.1)

## 2019-06-10 LAB — CBC WITH DIFFERENTIAL/PLATELET
Abs Immature Granulocytes: 0.16 10*3/uL — ABNORMAL HIGH (ref 0.00–0.07)
Basophils Absolute: 0.1 10*3/uL (ref 0.0–0.1)
Basophils Relative: 1 %
Eosinophils Absolute: 0.2 10*3/uL (ref 0.0–0.5)
Eosinophils Relative: 2 %
HCT: 36.4 % (ref 36.0–46.0)
Hemoglobin: 11.4 g/dL — ABNORMAL LOW (ref 12.0–15.0)
Immature Granulocytes: 2 %
Lymphocytes Relative: 15 %
Lymphs Abs: 1.6 10*3/uL (ref 0.7–4.0)
MCH: 23.9 pg — ABNORMAL LOW (ref 26.0–34.0)
MCHC: 31.3 g/dL (ref 30.0–36.0)
MCV: 76.3 fL — ABNORMAL LOW (ref 80.0–100.0)
Monocytes Absolute: 0.9 10*3/uL (ref 0.1–1.0)
Monocytes Relative: 8 %
Neutro Abs: 7.8 10*3/uL — ABNORMAL HIGH (ref 1.7–7.7)
Neutrophils Relative %: 72 %
Platelets: 579 10*3/uL — ABNORMAL HIGH (ref 150–400)
RBC: 4.77 MIL/uL (ref 3.87–5.11)
RDW: 22.2 % — ABNORMAL HIGH (ref 11.5–15.5)
WBC: 10.6 10*3/uL — ABNORMAL HIGH (ref 4.0–10.5)
nRBC: 0 % (ref 0.0–0.2)

## 2019-06-10 LAB — LIPID PANEL
Cholesterol: 148 mg/dL (ref 0–200)
HDL: 32 mg/dL — ABNORMAL LOW (ref >40)
LDL Cholesterol: 76 mg/dL (ref 0–99)
Total CHOL/HDL Ratio: 4.6 RATIO
Triglycerides: 198 mg/dL — ABNORMAL HIGH (ref <150)
VLDL: 40 mg/dL (ref 0–40)

## 2019-06-10 LAB — PHOSPHORUS: Phosphorus: 4.6 mg/dL (ref 2.5–4.6)

## 2019-06-10 LAB — MAGNESIUM: Magnesium: 1.9 mg/dL (ref 1.7–2.4)

## 2019-06-10 LAB — GLUCOSE, CAPILLARY
Glucose-Capillary: 178 mg/dL — ABNORMAL HIGH (ref 70–99)
Glucose-Capillary: 150 mg/dL — ABNORMAL HIGH (ref 70–99)
Glucose-Capillary: 184 mg/dL — ABNORMAL HIGH (ref 70–99)
Glucose-Capillary: 120 mg/dL — ABNORMAL HIGH (ref 70–99)

## 2019-06-10 MED ORDER — INSULIN ASPART 100 UNIT/ML ~~LOC~~ SOLN
0.0000 [IU] | SUBCUTANEOUS | Status: DC
  Administered 2019-06-10: 2 [IU] via SUBCUTANEOUS
  Administered 2019-06-10 (×2): 1 [IU] via SUBCUTANEOUS
  Administered 2019-06-11 (×2): 2 [IU] via SUBCUTANEOUS
  Administered 2019-06-11 – 2019-06-12 (×2): 1 [IU] via SUBCUTANEOUS
  Administered 2019-06-12: 2 [IU] via SUBCUTANEOUS
  Administered 2019-06-12: 1 [IU] via SUBCUTANEOUS

## 2019-06-10 MED ORDER — METFORMIN HCL 500 MG PO TABS
500.0000 mg | ORAL_TABLET | Freq: Two times a day (BID) | ORAL | Status: DC
  Administered 2019-06-10 – 2019-06-12 (×6): 500 mg via ORAL
  Filled 2019-06-10 (×5): qty 1

## 2019-06-10 NOTE — Progress Notes (Signed)
SATURATION QUALIFICATIONS: (This note is used to comply with regulatory documentation for home oxygen)  Patient Saturations on Room Air at Rest = 96%  Patient Saturations on Room Air while Ambulating = 94%  Patient Saturations on no  Liters of oxygen while Ambulating = N/A  Please briefly explain why patient needs home oxygen:  Patient tolerated the ambulation well maintaining her oxygen saturation in her 90's. Her heart rate was 92. She did not need any oxygen while ambulating.

## 2019-06-10 NOTE — Plan of Care (Signed)

## 2019-06-10 NOTE — Progress Notes (Signed)
   06/10/19 1332  Vitals  Pulse Rate 91  ECG Heart Rate 91  Resp 19  Oxygen Therapy  SpO2 (!) 86 %  O2 Device Room Air  MEWS Score  MEWS Temp 0  MEWS Systolic 0  MEWS Pulse 0  MEWS RR 0  MEWS LOC 0  MEWS Score 0  MEWS Score Color Green  pt laying in bed for nap, Saturation down to 86% on room air

## 2019-06-10 NOTE — Progress Notes (Signed)
PROGRESS NOTE    Mary Flynn  [id number] DOB: [birth year] DOA: 05/30/2019 PCP: [name], NP   Brief Narrative:  85 year old BF PMHx Depression, Memory loss, DM type II uncontrolled with complication, peripheral neuropathy, mixed dyslipidemia, subclinical hypothyroidism, RIGHT thyroid nodule, essential HTN, chronic pain syndrome (LEFT rib fracture, compression fracture L2)    Patient was recently seen and treated for acute cystitis through the family medicine office, had UTI, treated with ciprofloxacin.  Medication never started  According to the paramedics who transported the patient to the hospital she was found with a change in her mental status this morning when someone was talking to her on the phone.  This was a neighbor, when the paramedics arrived they found the patient to be hypoxic, tachycardic, ill-appearing slightly confused and had a temperature over 99.9, the activated a code sepsis prehospital.  No antibiotics were given, oxygen was supplied by nonrebreather.  Further review of the medical record shows that the patient had a change in her medication to nitrofurantoin yesterday.  The patient does not have any history of pulmonary embolism, however she is a diabetic taking multiple different diet medications including Jardiance and Metformin   Subjective: 4/17 A/O x4, negative CP, negative S OB.  Patient desats into the 80s when sleeping.  OSA?    Assessment & Plan:   Active Problems:   Septic shock (HCC)   AKI (acute kidney injury) ([place])   Severe sepsis with septic shock (HCC)   Bacteremia due to Escherichia coli   Pulmonary hypertension (HCC)   Demand ischemia (HCC)   Diabetes mellitus type 2, uncontrolled, with complications (HCC)  Septic shock positive ESBL bacteremia -Completed 7-day course of Meropenem   Acute respiratory failure with hypoxia -Multifactorial pulmonary hypertension, pulmonary edema. -Treat underlying conditions. -Titrate to  maintain SPO2> 94% -4/16 obtain ambulatory SPO2  CHF/pulmonary HTN -Dry weight 132 pounds (59.9 kg) per Dr. Sunny Schlein Revankar note from 05/10/2019 -Echocardiogram 4/7 showed hyperdynamic EF with moderate pulmonary HTN see results below. -Strict in and out -558.18ml -Daily weight  Filed Weights   06/08/19 0323 06/09/19 0321 06/10/19 0328  Weight: 56 kg 56 kg 55.6 kg  -Metoprolol 25 mg BID -4/14 Albumin 25 g x 1 -4/16 increase Lasix IV 40 mg TID  Tachycardia NOS -See CHF -Resolved   Demand ischemia secondary to severe sepsis -See echocardiogram below -Resolved  Acute kidney injury -Prior to admission normal creatinine -Continue to monitor creatinine level with diuresis Recent Labs  Lab 06/05/19 0219 06/07/19 0627 06/08/19 0221 06/09/19 0146 06/10/19 0400  CREATININE 0.74 0.75 0.80 0.80 0.97    C. difficile colitis -Patient now having episodes of diarrhea, C. difficile culture pending. -Metformin also discontinued as may be contributing to her diarrhea. -4/12 procalcitonin elevated= 1.42, will redraw in the A.m. Results for HELENNA, ZAYED (MRN [id number]) as of 06/09/2019 08:10  Ref. Range 06/05/2019 09:21 06/08/2019 02:21 06/09/2019 01:46  Procalcitonin Latest Units: ng/mL 1.42 0.35 0.23  -4/14 C. difficile screen negative.  C. difficile RULED OUT  DM type II uncontrolled with complication -4/6 hemoglobin A1c= 8.2 -4/16 increase Levemir 20 units qhs -4/16 increase NovoLog 8 units qac -4/17 Metformin 500 mg BID.  Will titrate up as patient tolerates -4/17 decreased to sensitive SSI   Hypothyroid -Synthroid 25 mcg daily -Recheck TSH in 4-6 weeks - GI prophylaxis -Protonix 40 mg daily  Prior cervicalgia On discharge discontinue amitriptyline 12.5, Flexeril 10--on beers criteria  HLD -4/17 LDL= 76 -DC Niaspan 1000 CR -Lipitor 10 mg daily  DVT prophylaxis: Heparin subcu Code Status: Full Family Communication: 4/17 spoke with Dr. Janese Banks (sister) discussed plan of  care answered all questions Disposition Plan:  1.  Where the patient is from 2.  Anticipated d/c place. 3.  Barriers to d/c resolution pulmonary edema on IV Lasix. Have increased IV Lasix. Patient's diabetes still uncontrolled. Have increased DM medication   Consultants:  PCCM   Procedures/Significant Events:  4/6 CTA PE protocol; negative PE.-Bibasilar infiltrates RIGHT>>> LEFT with mild interstitial edema 4/7 Echocardiogram;Left Ventricle: LVEF= 70 to 75%. The left ventricle has hyperdynamic function.-no regional wall motion abnormalities. The left ventricular internal cavity size was normal in size.  -Moderate LVH Right Ventricle: Moderately elevated pulmonary artery systolic pressure.  estimated right ventricular systolic pressure is [id number] mmHg.  Venous: The inferior vena cava is dilated in size with less than 50% respiratory variability, suggesting right atrial pressure of 15 mmHg.  4/16 PCXR;No active disease    I have personally reviewed and interpreted all radiology studies and my findings are as above.  VENTILATOR SETTINGS: Nasal cannula 4/16 Flow; 1 L/min SPO2 90%   Cultures 4/6 SARS coronavirus negative 4/6 influenza A/B negative 4/6 blood left AC positive E. coli 4/6 urine positive E. Coli 4/6 MRSA by PCR negative 4/14 C. difficile antigen/toxin negative      Antimicrobials: Anti-infectives (From admission, onward)   Start     Ordered Stop   06/05/19 1000  meropenem (MERREM) 1 g in sodium chloride 0.9 % 100 mL IVPB     06/05/19 0849 06/05/19 2228   06/04/19 1000  meropenem (MERREM) 1 g in sodium chloride 0.9 % 100 mL IVPB     06/04/19 0713 06/04/19 2325   05/31/19 0200  meropenem (MERREM) 1 g in sodium chloride 0.9 % 100 mL IVPB  Status:  Discontinued     05/30/19 1252 06/04/19 0713   05/30/19 1300  meropenem (MERREM) 1 g in sodium chloride 0.9 % 100 mL IVPB     05/30/19 1251 05/30/19 1413   05/30/19 0945  levofloxacin (LEVAQUIN) IVPB 750 mg  Status:   Discontinued     05/30/19 0933 05/30/19 0937   05/30/19 0945  cefTRIAXone (ROCEPHIN) 1 g in sodium chloride 0.9 % 100 mL IVPB     05/30/19 0937 05/30/19 1108   05/30/19 0945  azithromycin (ZITHROMAX) 500 mg in sodium chloride 0.9 % 250 mL IVPB     05/30/19 0937 05/30/19 1026       Devices    LINES / TUBES:      Continuous Infusions: . sodium chloride Stopped (05/31/19 1946)     Objective: Vitals:   06/10/19 0328 06/10/19 0721 06/10/19 0747 06/10/19 0944  BP: 132/66  (!) 123/92 (!) 131/54  Pulse: 86 89 91 96  Resp: 20 (!) 24 18   Temp: 98.3 F (36.8 C)  97.6 F (36.4 C)   TempSrc: Oral  Oral   SpO2: 99% 97% 91%   Weight: 55.6 kg     Height:        Intake/Output Summary (Last 24 hours) at 06/10/2019 1009 Last data filed at 06/10/2019 0750 Gross per 24 hour  Intake 240 ml  Output 500 ml  Net -260 ml   Filed Weights   06/08/19 0323 06/09/19 0321 06/10/19 0328  Weight: 56 kg 56 kg 55.6 kg   Physical Exam:  General: A/O x4, no acute respiratory distress Eyes: negative scleral hemorrhage, negative anisocoria, negative icterus ENT: Negative Runny nose, negative gingival bleeding, Neck:  Negative  scars, masses, torticollis, lymphadenopathy, JVD Lungs: Clear to auscultation bilaterally without wheezes or crackles Cardiovascular: Regular rate and rhythm without murmur gallop or rub normal S1 and S2 Abdomen: negative abdominal pain, nondistended, positive soft, bowel sounds, no rebound, no ascites, no appreciable mass Extremities: No significant cyanosis, clubbing, or edema bilateral lower extremities Skin: Negative rashes, lesions, ulcers Psychiatric:  Negative depression, negative anxiety, negative fatigue, negative mania  Central nervous system:  Cranial nerves II through XII intact, tongue/uvula midline, all extremities muscle strength 5/5, sensation intact throughout, negative dysarthria, negative expressive aphasia, negative receptive aphasia.  .     Data  Reviewed: Care during the described time interval was provided by me .  I have reviewed this patient's available data, including medical history, events of note, physical examination, and all test results as part of my evaluation.   CBC: Recent Labs  Lab 06/06/19 0244 06/07/19 0928 06/08/19 0221 06/09/19 0146 06/10/19 0400  WBC 13.3* 11.7* 11.4* 11.0* 10.6*  NEUTROABS 9.3* 8.9* 7.8* 8.1* 7.8*  HGB 10.7* 10.6* 10.7* 11.2* 11.4*  HCT 34.3* 34.4* 34.2* 35.6* 36.4  MCV 73.9* 75.6* 74.8* 74.5* 76.3*  PLT 409* 450* 470* 519* [id number]*   Basic Metabolic Panel: Recent Labs  Lab 06/04/19 0444 06/04/19 0444 06/05/19 0219 06/07/19 0627 06/08/19 0221 06/09/19 0146 06/10/19 0400  NA 139   < > 137 136 133* 133* 136  K 4.4   < > 4.3 4.9 4.4 5.3* 4.6  CL 106   < > 102 100 94* 95* 98  CO2 24   < > 23 25 26 24 24   GLUCOSE 160*   < > 184* 167* 169* 158* 166*  BUN 9   < > 9 11 13 17 21   CREATININE 0.70   < > 0.74 0.75 0.80 0.80 0.97  CALCIUM 8.0*   < > 8.5* 9.5 9.8 9.6 9.4  MG 1.7  --   --   --  1.8 1.8 1.9  PHOS  --   --   --   --  5.2* 4.6 4.6   < > = values in this interval not displayed.   GFR: Estimated Creatinine Clearance: 33 mL/min (by C-G formula based on SCr of 0.97 mg/dL). Liver Function Tests: Recent Labs  Lab 06/05/19 0219 06/07/19 0627 06/08/19 0221 06/09/19 0146 06/10/19 0400  AST 33 36 27 35 30  ALT 43 40 33 36 33  ALKPHOS 83 83 83 81 78  BILITOT 1.6* 1.2 1.4* 1.0 1.1  PROT 6.0* 6.1* 6.8 6.7 6.7  ALBUMIN 2.4* 2.6* 3.3* 3.2* 3.2*   No results for input(s): LIPASE, AMYLASE in the last 168 hours. No results for input(s): AMMONIA in the last 168 hours. Coagulation Profile: No results for input(s): INR, PROTIME in the last 168 hours. Cardiac Enzymes: No results for input(s): CKTOTAL, CKMB, CKMBINDEX, TROPONINI in the last 168 hours. BNP (last 3 results) Recent Labs    08/08/18 1507  PROBNP 86.0   HbA1C: No results for input(s): HGBA1C in the last 72  hours. CBG: Recent Labs  Lab 06/09/19 0815 06/09/19 1147 06/09/19 1659 06/09/19 2111 06/10/19 0615  GLUCAP 302* 276* 121* 187* 178*   Lipid Profile: Recent Labs    06/10/19 0400  CHOL 148  HDL 32*  LDLCALC 76  TRIG 198*  CHOLHDL 4.6   Thyroid Function Tests: No results for input(s): TSH, T4TOTAL, FREET4, T3FREE, THYROIDAB in the last 72 hours. Anemia Panel: No results for input(s): VITAMINB12, FOLATE, FERRITIN, TIBC, IRON, RETICCTPCT in the  last 72 hours. Urine analysis:    Component Value Date/Time   COLORURINE YELLOW 05/30/2019 1053   APPEARANCEUR HAZY (A) 05/30/2019 1053   LABSPEC 1.017 05/30/2019 1053   PHURINE 5.0 05/30/2019 1053   GLUCOSEU >=500 (A) 05/30/2019 1053   GLUCOSEU 500 (A) 07/08/2018 0814   HGBUR SMALL (A) 05/30/2019 1053   HGBUR negative 04/30/2010 0816   BILIRUBINUR NEGATIVE 05/30/2019 1053   BILIRUBINUR neg 05/24/2019 1326   KETONESUR 20 (A) 05/30/2019 1053   PROTEINUR 30 (A) 05/30/2019 1053   UROBILINOGEN negative (A) 05/24/2019 1326   UROBILINOGEN 0.2 07/08/2018 0814   NITRITE NEGATIVE 05/30/2019 1053   LEUKOCYTESUR TRACE (A) 05/30/2019 1053   Sepsis Labs: @LABRCNTIP (procalcitonin:4,lacticidven:4)  ) Recent Results (from the past 240 hour(s))  C Difficile Quick Screen w PCR reflex     Status: None   Collection Time: 06/07/19 12:39 PM   Specimen: STOOL  Result Value Ref Range Status   C Diff antigen NEGATIVE NEGATIVE Final   C Diff toxin NEGATIVE NEGATIVE Final   C Diff interpretation No C. difficile detected.  Final    Comment: Performed at [facility], [place] [address] [place], [place] [address]         Radiology Studies: DG CHEST PORT 1 VIEW  Result Date: 06/09/2019 CLINICAL DATA:  Pulmonary edema EXAM: PORTABLE CHEST 1 VIEW COMPARISON:  06/06/2019 FINDINGS: There is mild left basilar atelectasis. There is no focal consolidation. There is no pleural effusion or pneumothorax. The heart and mediastinal contours are  unremarkable. There is no acute osseous abnormality. IMPRESSION: No active disease. Electronically Signed   By: Kathreen Devoid   On: 06/09/2019 08:50        Scheduled Meds: . aspirin EC  81 mg Oral Daily  . atorvastatin  10 mg Oral Daily  . budesonide  0.25 mg Inhalation BID  . Chlorhexidine Gluconate Cloth  6 each Topical Daily  . DULoxetine  30 mg Oral Daily  . furosemide  40 mg Intravenous BID  . gabapentin  100 mg Oral BID  . heparin  5,000 Units Subcutaneous Q8H  . insulin aspart  0-15 Units Subcutaneous TID WC  . insulin aspart  8 Units Subcutaneous TID WC  . insulin detemir  20 Units Subcutaneous QHS  . latanoprost  1 drop Both Eyes QHS  . levothyroxine  25 mcg Oral Q0600  . magnesium oxide  800 mg Oral BID  . mouth rinse  15 mL Mouth Rinse BID  . metoprolol tartrate  25 mg Oral BID  . pantoprazole  40 mg Oral Daily  . sodium chloride flush  10-40 mL Intracatheter Q12H  . timolol  1 drop Both Eyes Daily   Continuous Infusions: . sodium chloride Stopped (05/31/19 1946)     LOS: 11 days   The patient is critically ill with multiple organ systems failure and requires high complexity decision making for assessment and support, frequent evaluation and titration of therapies, application of advanced monitoring technologies and extensive interpretation of multiple databases. Critical Care Time devoted to patient care services described in this note  Time spent: 40 minutes     Inga Noller, Geraldo Docker, MD [facility] Pager [phone]  If 7PM-7AM, please contact night-coverage [email or web address] Password [facility] 06/10/2019, 10:09 AM

## 2019-06-11 DIAGNOSIS — R7881 Bacteremia: Secondary | ICD-10-CM | POA: Diagnosis not present

## 2019-06-11 DIAGNOSIS — N179 Acute kidney failure, unspecified: Secondary | ICD-10-CM | POA: Diagnosis not present

## 2019-06-11 DIAGNOSIS — A419 Sepsis, unspecified organism: Secondary | ICD-10-CM | POA: Diagnosis not present

## 2019-06-11 DIAGNOSIS — N3000 Acute cystitis without hematuria: Secondary | ICD-10-CM | POA: Diagnosis not present

## 2019-06-11 LAB — CBC WITH DIFFERENTIAL/PLATELET
Abs Immature Granulocytes: 0.09 10*3/uL — ABNORMAL HIGH (ref 0.00–0.07)
Basophils Absolute: 0.1 10*3/uL (ref 0.0–0.1)
Basophils Relative: 1 %
Eosinophils Absolute: 0.2 10*3/uL (ref 0.0–0.5)
Eosinophils Relative: 2 %
HCT: 34.8 % — ABNORMAL LOW (ref 36.0–46.0)
Hemoglobin: 10.8 g/dL — ABNORMAL LOW (ref 12.0–15.0)
Immature Granulocytes: 1 %
Lymphocytes Relative: 16 %
Lymphs Abs: 1.8 10*3/uL (ref 0.7–4.0)
MCH: 23.5 pg — ABNORMAL LOW (ref 26.0–34.0)
MCHC: 31 g/dL (ref 30.0–36.0)
MCV: 75.8 fL — ABNORMAL LOW (ref 80.0–100.0)
Monocytes Absolute: 0.9 10*3/uL (ref 0.1–1.0)
Monocytes Relative: 8 %
Neutro Abs: 8.1 10*3/uL — ABNORMAL HIGH (ref 1.7–7.7)
Neutrophils Relative %: 72 %
Platelets: 533 10*3/uL — ABNORMAL HIGH (ref 150–400)
RBC: 4.59 MIL/uL (ref 3.87–5.11)
RDW: 21.5 % — ABNORMAL HIGH (ref 11.5–15.5)
WBC: 11 10*3/uL — ABNORMAL HIGH (ref 4.0–10.5)
nRBC: 0 % (ref 0.0–0.2)

## 2019-06-11 LAB — COMPREHENSIVE METABOLIC PANEL
ALT: 28 U/L (ref 0–44)
AST: 24 U/L (ref 15–41)
Albumin: 3.2 g/dL — ABNORMAL LOW (ref 3.5–5.0)
Alkaline Phosphatase: 74 U/L (ref 38–126)
Anion gap: 13 (ref 5–15)
BUN: 26 mg/dL — ABNORMAL HIGH (ref 8–23)
CO2: 22 mmol/L (ref 22–32)
Calcium: 9.2 mg/dL (ref 8.9–10.3)
Chloride: 97 mmol/L — ABNORMAL LOW (ref 98–111)
Creatinine, Ser: 0.94 mg/dL (ref 0.44–1.00)
GFR calc Af Amer: >60 mL/min (ref >60)
GFR calc non Af Amer: 56 mL/min — ABNORMAL LOW (ref >60)
Glucose, Bld: 188 mg/dL — ABNORMAL HIGH (ref 70–99)
Potassium: 4.2 mmol/L (ref 3.5–5.1)
Sodium: 132 mmol/L — ABNORMAL LOW (ref 135–145)
Total Bilirubin: 1 mg/dL (ref 0.3–1.2)
Total Protein: 6.4 g/dL — ABNORMAL LOW (ref 6.5–8.1)

## 2019-06-11 LAB — MAGNESIUM: Magnesium: 1.7 mg/dL (ref 1.7–2.4)

## 2019-06-11 LAB — GLUCOSE, CAPILLARY
Glucose-Capillary: 168 mg/dL — ABNORMAL HIGH (ref 70–99)
Glucose-Capillary: 116 mg/dL — ABNORMAL HIGH (ref 70–99)
Glucose-Capillary: 132 mg/dL — ABNORMAL HIGH (ref 70–99)
Glucose-Capillary: 164 mg/dL — ABNORMAL HIGH (ref 70–99)
Glucose-Capillary: 108 mg/dL — ABNORMAL HIGH (ref 70–99)
Glucose-Capillary: 139 mg/dL — ABNORMAL HIGH (ref 70–99)

## 2019-06-11 LAB — PHOSPHORUS: Phosphorus: 4.5 mg/dL (ref 2.5–4.6)

## 2019-06-11 NOTE — Progress Notes (Signed)
PROGRESS NOTE    Mary Flynn  [id number] DOB: 1934-03-27 DOA: 05/30/2019 PCP: Mary Alar, NP   Brief Narrative:  85 year old BF PMHx Depression, Memory loss, DM type II uncontrolled with complication, peripheral neuropathy, mixed dyslipidemia, subclinical hypothyroidism, RIGHT thyroid nodule, essential HTN, chronic pain syndrome (LEFT rib fracture, compression fracture L2)    Patient was recently seen and treated for acute cystitis through the family medicine office, had UTI, treated with ciprofloxacin.  Medication never started  According to the paramedics who transported the patient to the hospital she was found with a change in her mental status this morning when someone was talking to her on the phone.  This was a neighbor, when the paramedics arrived they found the patient to be hypoxic, tachycardic, ill-appearing slightly confused and had a temperature over 99.9, the activated a code sepsis prehospital.  No antibiotics were given, oxygen was supplied by nonrebreather.  Further review of the medical record shows that the patient had a change in her medication to nitrofurantoin yesterday.  The patient does not have any history of pulmonary embolism, however she is a diabetic taking multiple different diet medications including Jardiance and Metformin   Subjective: 4/18 A/O x4, negative CP, negative S OB.  Desats into the 80s when sleeping most likely OSA Dr. Melvyn Flynn is her pulmonologist      Assessment & Plan:   Active Problems:   Septic shock (HCC)   AKI (acute kidney injury) ([place])   Severe sepsis with septic shock (HCC)   Bacteremia due to Escherichia coli   Pulmonary hypertension (HCC)   Demand ischemia (HCC)   Diabetes mellitus type 2, uncontrolled, with complications (HCC)  Septic shock positive ESBL bacteremia -Completed 7-day course of Meropenem   Acute respiratory failure with hypoxia -Multifactorial pulmonary hypertension, pulmonary edema. -Treat  underlying conditions. -Titrate to maintain SPO2> 94% -Patient laying in bed for a nap saturation down to 86% on room air -Patient meets criteria for home O2. -2 L O2 via [place] titrate to maintain SPO2> 94%  OSA -Patient has never been officially evaluated however witnessed apneic events, and desaturates to low 80s when sleeping. -Schedule appointment with Dr. Legrand Como Flynn pulmonology for 1 to 2 weeks post discharge.  Spirometry, DLCO.  Sleep study required  CHF/pulmonary HTN -Dry weight 132 pounds (59.9 kg) per Dr. Sunny Schlein Flynn note from 05/10/2019 -Echocardiogram 4/7 showed hyperdynamic EF with moderate pulmonary HTN see results below. -Strict in and out -1.0 L -Daily weight  Filed Weights   06/09/19 0321 06/10/19 0328 06/11/19 0411  Weight: 56 kg 55.6 kg 56 kg  -Metoprolol 25 mg BID -4/14 Albumin 25 g x 1 -4/16 increase Lasix IV 40 mg TID  Tachycardia NOS -See CHF -Resolved   Demand ischemia secondary to severe sepsis -See echocardiogram below -Resolved  Acute kidney injury -Prior to admission normal creatinine -Continue to monitor creatinine level with diuresis Recent Labs  Lab 06/07/19 0627 06/08/19 0221 06/09/19 0146 06/10/19 0400 06/11/19 0230  CREATININE 0.75 0.80 0.80 0.97 0.94    C. difficile colitis -Patient now having episodes of diarrhea, C. difficile culture pending. -Metformin also discontinued as may be contributing to her diarrhea. -4/12 procalcitonin elevated= 1.42, will redraw in the A.m. Results for Mary Flynn (MRN [id number]) as of 06/09/2019 08:10  Ref. Range 06/05/2019 09:21 06/08/2019 02:21 06/09/2019 01:46  Procalcitonin Latest Units: ng/mL 1.42 0.35 0.23  -4/14 C. difficile screen negative.  C. difficile RULED OUT  DM type II uncontrolled with complication -4/6 hemoglobin A1c=  8.2 -4/16 increase Levemir 20 units qhs -4/16 increase NovoLog 8 units qac -4/17 Metformin 500 mg BID.  Will titrate up as patient tolerates -4/17 decreased to  sensitive SSI   Hypothyroid -Synthroid 25 mcg daily -Recheck TSH in 4-6 weeks - GI prophylaxis -Protonix 40 mg daily  Prior cervicalgia On discharge discontinue amitriptyline 12.5, Flexeril 10--on beers criteria  HLD -4/17 LDL= 76 -DC Niaspan 1000 CR -Lipitor 10 mg daily      DVT prophylaxis: Heparin subcu Code Status: Full Family Communication: 4/17 spoke with Dr. Janese Flynn (sister) discussed plan of care answered all questions Disposition Plan:  1.  Where the patient is from 2.  Anticipated d/c place. 3.  Barriers to d/c resolution pulmonary edema on IV Lasix. Have increased IV Lasix. Patient's diabetes still uncontrolled. Have increased DM medication   Consultants:  PCCM   Procedures/Significant Events:  4/6 CTA PE protocol; negative PE.-Bibasilar infiltrates RIGHT>>> LEFT with mild interstitial edema 4/7 Echocardiogram;Left Ventricle: LVEF= 70 to 75%. The left ventricle has hyperdynamic function.-no regional wall motion abnormalities. The left ventricular internal cavity size was normal in size.  -Moderate LVH Right Ventricle: Moderately elevated pulmonary artery systolic pressure.  estimated right ventricular systolic pressure is [id number] mmHg.  Venous: The inferior vena cava is dilated in size with less than 50% respiratory variability, suggesting right atrial pressure of 15 mmHg.  4/16 PCXR;No active disease    I have personally reviewed and interpreted all radiology studies and my findings are as above.  VENTILATOR SETTINGS: Nasal cannula 4/16 Flow; 1 L/min SPO2 90%   Cultures 4/6 SARS coronavirus negative 4/6 influenza A/B negative 4/6 blood left AC positive E. coli 4/6 urine positive E. Coli 4/6 MRSA by PCR negative 4/14 C. difficile antigen/toxin negative      Antimicrobials: Anti-infectives (From admission, onward)   Start     Ordered Stop   06/05/19 1000  meropenem (MERREM) 1 g in sodium chloride 0.9 % 100 mL IVPB     06/05/19 0849 06/05/19 2228    06/04/19 1000  meropenem (MERREM) 1 g in sodium chloride 0.9 % 100 mL IVPB     06/04/19 0713 06/04/19 2325   05/31/19 0200  meropenem (MERREM) 1 g in sodium chloride 0.9 % 100 mL IVPB  Status:  Discontinued     05/30/19 1252 06/04/19 0713   05/30/19 1300  meropenem (MERREM) 1 g in sodium chloride 0.9 % 100 mL IVPB     05/30/19 1251 05/30/19 1413   05/30/19 0945  levofloxacin (LEVAQUIN) IVPB 750 mg  Status:  Discontinued     05/30/19 0933 05/30/19 0937   05/30/19 0945  cefTRIAXone (ROCEPHIN) 1 g in sodium chloride 0.9 % 100 mL IVPB     05/30/19 0937 05/30/19 1108   05/30/19 0945  azithromycin (ZITHROMAX) 500 mg in sodium chloride 0.9 % 250 mL IVPB     05/30/19 0937 05/30/19 1026       Devices    LINES / TUBES:      Continuous Infusions: . sodium chloride Stopped (05/31/19 1946)     Objective: Vitals:   06/10/19 2330 06/11/19 0411 06/11/19 0820 06/11/19 0853  BP: (!) 120/57 135/60  118/67  Pulse: 86 96    Resp: (!) 23 15    Temp: 98 F (36.7 C) 98.2 F (36.8 C)    TempSrc: Oral Oral    SpO2: 98% 96% 94%   Weight:  56 kg    Height:        Intake/Output Summary (  Last 24 hours) at 06/11/2019 1200 Last data filed at 06/11/2019 0911 Gross per 24 hour  Intake 10 ml  Output 325 ml  Net -315 ml   Filed Weights   06/09/19 0321 06/10/19 0328 06/11/19 0411  Weight: 56 kg 55.6 kg 56 kg   Physical Exam:  General: A/O x4, no acute respiratory distress Eyes: negative scleral hemorrhage, negative anisocoria, negative icterus ENT: Negative Runny nose, negative gingival bleeding, Neck:  Negative scars, masses, torticollis, lymphadenopathy, JVD Lungs: Clear to auscultation bilaterally without wheezes or crackles Cardiovascular: Regular rate and rhythm without murmur gallop or rub normal S1 and S2 Abdomen: negative abdominal pain, nondistended, positive soft, bowel sounds, no rebound, no ascites, no appreciable mass Extremities: No significant cyanosis, clubbing, or edema  bilateral lower extremities Skin: Negative rashes, lesions, ulcers Psychiatric:  Negative depression, negative anxiety, negative fatigue, negative mania  Central nervous system:  Cranial nerves II through XII intact, tongue/uvula midline, all extremities muscle strength 5/5, sensation intact throughout, negative dysarthria, negative expressive aphasia, negative receptive aphasia     Data Reviewed: Care during the described time interval was provided by me .  I have reviewed this patient's available data, including medical history, events of note, physical examination, and all test results as part of my evaluation.   CBC: Recent Labs  Lab 06/07/19 0928 06/08/19 0221 06/09/19 0146 06/10/19 0400 06/11/19 0230  WBC 11.7* 11.4* 11.0* 10.6* 11.0*  NEUTROABS 8.9* 7.8* 8.1* 7.8* 8.1*  HGB 10.6* 10.7* 11.2* 11.4* 10.8*  HCT 34.4* 34.2* 35.6* 36.4 34.8*  MCV 75.6* 74.8* 74.5* 76.3* 75.8*  PLT 450* 470* 519* 579* [id number]*   Basic Metabolic Panel: Recent Labs  Lab 06/07/19 0627 06/08/19 0221 06/09/19 0146 06/10/19 0400 06/11/19 0230  NA 136 133* 133* 136 132*  K 4.9 4.4 5.3* 4.6 4.2  CL 100 94* 95* 98 97*  CO2 25 26 24 24 22   GLUCOSE 167* 169* 158* 166* 188*  BUN 11 13 17 21  26*  CREATININE 0.75 0.80 0.80 0.97 0.94  CALCIUM 9.5 9.8 9.6 9.4 9.2  MG  --  1.8 1.8 1.9 1.7  PHOS  --  5.2* 4.6 4.6 4.5   GFR: Estimated Creatinine Clearance: 34.2 mL/min (by C-G formula based on SCr of 0.94 mg/dL). Liver Function Tests: Recent Labs  Lab 06/07/19 0627 06/08/19 0221 06/09/19 0146 06/10/19 0400 06/11/19 0230  AST 36 27 35 30 24  ALT 40 33 36 33 28  ALKPHOS 83 83 81 78 74  BILITOT 1.2 1.4* 1.0 1.1 1.0  PROT 6.1* 6.8 6.7 6.7 6.4*  ALBUMIN 2.6* 3.3* 3.2* 3.2* 3.2*   No results for input(s): LIPASE, AMYLASE in the last 168 hours. No results for input(s): AMMONIA in the last 168 hours. Coagulation Profile: No results for input(s): INR, PROTIME in the last 168 hours. Cardiac  Enzymes: No results for input(s): CKTOTAL, CKMB, CKMBINDEX, TROPONINI in the last 168 hours. BNP (last 3 results) Recent Labs    08/08/18 1507  PROBNP 86.0   HbA1C: No results for input(s): HGBA1C in the last 72 hours. CBG: Recent Labs  Lab 06/10/19 2117 06/11/19 0610 06/11/19 0735 06/11/19 0827 06/11/19 1146  GLUCAP 120* 168* 132* 116* 164*   Lipid Profile: Recent Labs    06/10/19 0400  CHOL 148  HDL 32*  LDLCALC 76  TRIG 198*  CHOLHDL 4.6   Thyroid Function Tests: No results for input(s): TSH, T4TOTAL, FREET4, T3FREE, THYROIDAB in the last 72 hours. Anemia Panel: No results for input(s): VITAMINB12,  FOLATE, FERRITIN, TIBC, IRON, RETICCTPCT in the last 72 hours. Urine analysis:    Component Value Date/Time   COLORURINE YELLOW 05/30/2019 1053   APPEARANCEUR HAZY (A) 05/30/2019 1053   LABSPEC 1.017 05/30/2019 1053   PHURINE 5.0 05/30/2019 1053   GLUCOSEU >=500 (A) 05/30/2019 1053   GLUCOSEU 500 (A) 07/08/2018 0814   HGBUR SMALL (A) 05/30/2019 1053   HGBUR negative 04/30/2010 0816   BILIRUBINUR NEGATIVE 05/30/2019 1053   BILIRUBINUR neg 05/24/2019 1326   KETONESUR 20 (A) 05/30/2019 1053   PROTEINUR 30 (A) 05/30/2019 1053   UROBILINOGEN negative (A) 05/24/2019 1326   UROBILINOGEN 0.2 07/08/2018 0814   NITRITE NEGATIVE 05/30/2019 1053   LEUKOCYTESUR TRACE (A) 05/30/2019 1053   Sepsis Labs: @LABRCNTIP (procalcitonin:4,lacticidven:4)  ) Recent Results (from the past 240 hour(s))  C Difficile Quick Screen w PCR reflex     Status: None   Collection Time: 06/07/19 12:39 PM   Specimen: STOOL  Result Value Ref Range Status   C Diff antigen NEGATIVE NEGATIVE Final   C Diff toxin NEGATIVE NEGATIVE Final   C Diff interpretation No C. difficile detected.  Final    Comment: Performed at [facility], [place] [address] [place], [place] [address]         Radiology Studies: No results found.      Scheduled Meds: . aspirin EC  81 mg Oral Daily  .  atorvastatin  10 mg Oral Daily  . budesonide  0.25 mg Inhalation BID  . Chlorhexidine Gluconate Cloth  6 each Topical Daily  . DULoxetine  30 mg Oral Daily  . furosemide  40 mg Intravenous BID  . gabapentin  100 mg Oral BID  . heparin  5,000 Units Subcutaneous Q8H  . insulin aspart  0-9 Units Subcutaneous Q4H  . insulin aspart  8 Units Subcutaneous TID WC  . insulin detemir  20 Units Subcutaneous QHS  . latanoprost  1 drop Both Eyes QHS  . levothyroxine  25 mcg Oral Q0600  . magnesium oxide  800 mg Oral BID  . mouth rinse  15 mL Mouth Rinse BID  . metFORMIN  500 mg Oral BID WC  . metoprolol tartrate  25 mg Oral BID  . pantoprazole  40 mg Oral Daily  . sodium chloride flush  10-40 mL Intracatheter Q12H  . timolol  1 drop Both Eyes Daily   Continuous Infusions: . sodium chloride Stopped (05/31/19 1946)     LOS: 12 days   The patient is critically ill with multiple organ systems failure and requires high complexity decision making for assessment and support, frequent evaluation and titration of therapies, application of advanced monitoring technologies and extensive interpretation of multiple databases. Critical Care Time devoted to patient care services described in this note  Time spent: 40 minutes     Analaya Hoey, Geraldo Docker, MD [facility] Pager [phone]  If 7PM-7AM, please contact night-coverage [email or web address] Password [email or web address] 06/11/2019, 12:00 PM

## 2019-06-11 NOTE — Plan of Care (Signed)

## 2019-06-12 DIAGNOSIS — R7881 Bacteremia: Secondary | ICD-10-CM | POA: Diagnosis not present

## 2019-06-12 DIAGNOSIS — J810 Acute pulmonary edema: Secondary | ICD-10-CM

## 2019-06-12 DIAGNOSIS — N179 Acute kidney failure, unspecified: Secondary | ICD-10-CM | POA: Diagnosis not present

## 2019-06-12 DIAGNOSIS — N3000 Acute cystitis without hematuria: Secondary | ICD-10-CM | POA: Diagnosis not present

## 2019-06-12 DIAGNOSIS — A419 Sepsis, unspecified organism: Secondary | ICD-10-CM | POA: Diagnosis not present

## 2019-06-12 DIAGNOSIS — J189 Pneumonia, unspecified organism: Secondary | ICD-10-CM

## 2019-06-12 LAB — COMPREHENSIVE METABOLIC PANEL
ALT: 29 U/L (ref 0–44)
AST: 32 U/L (ref 15–41)
Albumin: 3.1 g/dL — ABNORMAL LOW (ref 3.5–5.0)
Alkaline Phosphatase: 78 U/L (ref 38–126)
Anion gap: 13 (ref 5–15)
BUN: 30 mg/dL — ABNORMAL HIGH (ref 8–23)
CO2: 27 mmol/L (ref 22–32)
Calcium: 9.4 mg/dL (ref 8.9–10.3)
Chloride: 96 mmol/L — ABNORMAL LOW (ref 98–111)
Creatinine, Ser: 0.87 mg/dL (ref 0.44–1.00)
GFR calc Af Amer: >60 mL/min (ref >60)
GFR calc non Af Amer: >60 mL/min (ref >60)
Glucose, Bld: 117 mg/dL — ABNORMAL HIGH (ref 70–99)
Potassium: 4.3 mmol/L (ref 3.5–5.1)
Sodium: 136 mmol/L (ref 135–145)
Total Bilirubin: 0.9 mg/dL (ref 0.3–1.2)
Total Protein: 6.6 g/dL (ref 6.5–8.1)

## 2019-06-12 LAB — GLUCOSE, CAPILLARY
Glucose-Capillary: 120 mg/dL — ABNORMAL HIGH (ref 70–99)
Glucose-Capillary: 122 mg/dL — ABNORMAL HIGH (ref 70–99)
Glucose-Capillary: 125 mg/dL — ABNORMAL HIGH (ref 70–99)
Glucose-Capillary: 166 mg/dL — ABNORMAL HIGH (ref 70–99)

## 2019-06-12 LAB — CBC WITH DIFFERENTIAL/PLATELET
Abs Immature Granulocytes: 0.06 10*3/uL (ref 0.00–0.07)
Basophils Absolute: 0.1 10*3/uL (ref 0.0–0.1)
Basophils Relative: 1 %
Eosinophils Absolute: 0.1 10*3/uL (ref 0.0–0.5)
Eosinophils Relative: 1 %
HCT: 34.7 % — ABNORMAL LOW (ref 36.0–46.0)
Hemoglobin: 11 g/dL — ABNORMAL LOW (ref 12.0–15.0)
Immature Granulocytes: 1 %
Lymphocytes Relative: 20 %
Lymphs Abs: 1.7 10*3/uL (ref 0.7–4.0)
MCH: 24.1 pg — ABNORMAL LOW (ref 26.0–34.0)
MCHC: 31.7 g/dL (ref 30.0–36.0)
MCV: 76.1 fL — ABNORMAL LOW (ref 80.0–100.0)
Monocytes Absolute: 0.8 10*3/uL (ref 0.1–1.0)
Monocytes Relative: 9 %
Neutro Abs: 5.9 10*3/uL (ref 1.7–7.7)
Neutrophils Relative %: 68 %
Platelets: 551 10*3/uL — ABNORMAL HIGH (ref 150–400)
RBC: 4.56 MIL/uL (ref 3.87–5.11)
RDW: 22 % — ABNORMAL HIGH (ref 11.5–15.5)
WBC: 8.6 10*3/uL (ref 4.0–10.5)
nRBC: 0 % (ref 0.0–0.2)

## 2019-06-12 LAB — MAGNESIUM: Magnesium: 1.9 mg/dL (ref 1.7–2.4)

## 2019-06-12 LAB — PHOSPHORUS: Phosphorus: 4.8 mg/dL — ABNORMAL HIGH (ref 2.5–4.6)

## 2019-06-12 MED ORDER — INSULIN DETEMIR 100 UNIT/ML FLEXPEN: 20.0000 [IU] | PEN_INJECTOR | Freq: Every day | SUBCUTANEOUS | 3 refills | Status: DC

## 2019-06-12 MED ORDER — FUROSEMIDE 40 MG PO TABS: 40.0000 mg | ORAL_TABLET | Freq: Every day | ORAL | 0 refills | Status: AC | PRN

## 2019-06-12 MED ORDER — METOPROLOL TARTRATE 25 MG PO TABS: 25.0000 mg | ORAL_TABLET | Freq: Two times a day (BID) | ORAL | 0 refills | Status: DC

## 2019-06-12 MED ORDER — MELATONIN 3 MG PO TABS: 6.0000 mg | ORAL_TABLET | Freq: Every evening | ORAL | 0 refills | Status: AC | PRN

## 2019-06-12 MED ORDER — MAGNESIUM OXIDE 400 (241.3 MG) MG PO TABS: 800.0000 mg | ORAL_TABLET | Freq: Two times a day (BID) | ORAL | 0 refills | Status: DC

## 2019-06-12 MED ORDER — METFORMIN HCL 500 MG PO TABS: 500.0000 mg | ORAL_TABLET | Freq: Two times a day (BID) | ORAL | 0 refills | Status: DC

## 2019-06-12 MED ORDER — GABAPENTIN 100 MG PO CAPS: 100.0000 mg | ORAL_CAPSULE | Freq: Two times a day (BID) | ORAL | 0 refills | Status: DC

## 2019-06-12 MED ORDER — PEN NEEDLES 32G X 4 MM MISC: 1.0000 | 1 refills | Status: DC

## 2019-06-12 MED FILL — MAGNESIUM OXIDE 400 MG TABS: 400 | 15 days supply | Qty: 60 | Fill #0

## 2019-06-12 MED FILL — LEVEMIR FLEXTOUCH 100 UNITS: 100 | 30 days supply | Qty: 6 | Fill #0

## 2019-06-12 MED FILL — metFORMIN HCL 500 MG TABS: 500 | 30 days supply | Qty: 60 | Fill #0

## 2019-06-12 MED FILL — MELATONIN 3 MG TABS: 3 | 15 days supply | Qty: 30 | Fill #0

## 2019-06-12 MED FILL — FUROSEMIDE 40 MG TABLET: 40 | 30 days supply | Qty: 30 | Fill #0

## 2019-06-12 MED FILL — GABAPENTIN 100 MG CAPSULE: 100 | 30 days supply | Qty: 60 | Fill #0

## 2019-06-12 MED FILL — PENTIPS 32G X 4 MM MISC: 32G X 4 MM | 30 days supply | Qty: 100 | Fill #0

## 2019-06-12 MED FILL — METOPROLOL TARTRATE 25 MG T: 25 | 30 days supply | Qty: 60 | Fill #0

## 2019-06-12 NOTE — Progress Notes (Signed)
When Pt naps, she will desat to the low 80's - 84 to 86 requiring oxygen to be applied.  Occurred on 4/18, and when I cared for her on 06/09/2019

## 2019-06-12 NOTE — Care Management Important Message (Signed)
Important Message  Patient Details  Name: Mary Flynn MRN: [id number] Date of Birth: 02-Jan-1935   Medicare Important Message Given:  Yes     Zenon Mayo, RN 06/12/2019, 3:29 PM

## 2019-06-12 NOTE — Discharge Instructions (Signed)
Heart Failure, Self Care Heart failure is a serious condition. This sheet explains things you need to do to take care of yourself at home. To help you stay as healthy as possible, you may be asked to change your diet, take certain medicines, and make other changes in your life. Your doctor may also give you more specific instructions. If you have problems or questions, call your doctor. What are the risks? Having heart failure makes it more likely for you to have some problems. These problems can get worse if you do not take good care of yourself. Problems may include:  Blood clotting problems. This may cause a stroke.  Damage to the kidneys, liver, or lungs.  Abnormal heart rhythms. Supplies needed:  Scale for weighing yourself.  Blood pressure monitor.  Notebook.  Medicines. How to care for yourself when you have heart failure Medicines Take over-the-counter and prescription medicines only as told by your doctor. Take your medicines every day.  Do not stop taking your medicine unless your doctor tells you to do so.  Do not skip any medicines.  Get your prescriptions refilled before you run out of medicine. This is important. Eating and drinking   Eat heart-healthy foods. Talk with a diet specialist (dietitian) to create an eating plan.  Choose foods that: ? Have no trans fat. ? Are low in saturated fat and cholesterol.  Choose healthy foods, such as: ? Fresh or frozen fruits and vegetables. ? Fish. ? Low-fat (lean) meats. ? Legumes, such as beans, peas, and lentils. ? Fat-free or low-fat dairy products. ? Whole-grain foods. ? High-fiber foods.  Limit salt (sodium) if told by your doctor. Ask your diet specialist to tell you which seasonings are healthy for your heart.  Cook in healthy ways instead of frying. Healthy ways of cooking include roasting, grilling, broiling, baking, poaching, steaming, and stir-frying.  Limit how much fluid you drink, if told by your  doctor. Alcohol use  Do not drink alcohol if: ? Your doctor tells you not to drink. ? Your heart was damaged by alcohol, or you have very bad heart failure. ? You are pregnant, may be pregnant, or are planning to become pregnant.  If you drink alcohol: ? Limit how much you use to:  0-1 drink a day for women.  0-2 drinks a day for men. ? Be aware of how much alcohol is in your drink. In the [place] one drink equals one 12 oz bottle of beer (355 mL), one 5 oz glass of wine (148 mL), or one 1 oz glass of hard liquor (44 mL). Lifestyle   Do not use any products that contain nicotine or tobacco, such as cigarettes, e-cigarettes, and chewing tobacco. If you need help quitting, ask your doctor. ? Do not use nicotine gum or patches before talking to your doctor.  Do not use illegal drugs.  Lose weight if told by your doctor.  Do physical activity if told by your doctor. Talk to your doctor before you begin an exercise if: ? You are an older adult. ? You have very bad heart failure.  Learn to manage stress. If you need help, ask your doctor.  Get rehab (rehabilitation) to help you stay independent and to help with your quality of life.  Plan time to rest when you get tired. Check weight and blood pressure   Weigh yourself every day. This will help you to know if fluid is building up in your body. ? Weigh yourself every morning   after you pee (urinate) and before you eat breakfast. ? Wear the same amount of clothing each time. ? Write down your daily weight. Give your record to your doctor.  Check and write down your blood pressure as told by your doctor.  Check your pulse as told by your doctor. Dealing with very hot and very cold weather  If it is very hot: ? Avoid activities that take a lot of energy. ? Use air conditioning or fans, or find a cooler place. ? Avoid caffeine and alcohol. ? Wear clothing that is loose-fitting, lightweight, and light-colored.  If it is very  cold: ? Avoid activities that take a lot of energy. ? Layer your clothes. ? Wear mittens or gloves, a hat, and a scarf when you go outside. ? Avoid alcohol. Follow these instructions at home:  Stay up to date with shots (vaccines). Get pneumococcal and flu (influenza) shots.  Keep all follow-up visits as told by your doctor. This is important. Contact a doctor if:  You gain weight quickly.  You have increasing shortness of breath.  You cannot do your normal activities.  You get tired easily.  You cough a lot.  You don't feel like eating or feel like you may vomit (nauseous).  You become puffy (swell) in your hands, feet, ankles, or belly (abdomen).  You cannot sleep well because it is hard to breathe.  You feel like your heart is beating fast (palpitations).  You get dizzy when you stand up. Get help right away if:  You have trouble breathing.  You or someone else notices a change in your behavior, such as having trouble staying awake.  You have chest pain or discomfort.  You pass out (faint). These symptoms may be an emergency. Do not wait to see if the symptoms will go away. Get medical help right away. Call your local emergency services (911 in the [place] Do not drive yourself to the hospital. Summary  Heart failure is a serious condition. To care for yourself, you may have to change your diet, take medicines, and make other lifestyle changes.  Take your medicines every day. Do not stop taking them unless your doctor tells you to do so.  Eat heart-healthy foods, such as fresh or frozen fruits and vegetables, fish, lean meats, legumes, fat-free or low-fat dairy products, and whole-grain or high-fiber foods.  Ask your doctor if you can drink alcohol. You may have to stop alcohol use if you have very bad heart failure.  Contact your doctor if you gain weight quickly or feel that your heart is beating too fast. Get help right away if you pass out, or have chest pain  or trouble breathing. This information is not intended to replace advice given to you by your health care provider. Make sure you discuss any questions you have with your health care provider. Document Revised: 05/24/2018 Document Reviewed: 05/25/2018 Elsevier Patient Education  [place].    Heart Failure Eating Plan Heart failure, also called congestive heart failure, occurs when your heart does not pump blood well enough to meet your body's needs for oxygen-rich blood. Heart failure is a long-term (chronic) condition. Living with heart failure can be challenging. However, following your health care provider's instructions about a healthy lifestyle and working with a diet and nutrition specialist (dietitian) to choose the right foods may help to improve your symptoms. What are tips for following this plan? Reading food labels  Check food labels for the amount of sodium  per serving. Choose foods that have less than 140 mg (milligrams) of sodium in each serving.  Check food labels for the number of calories per serving. This is important if you need to limit your daily calorie intake to lose weight.  Check food labels for the serving size. If you eat more than one serving, you will be eating more sodium and calories than what is listed on the label.  Look for foods that are labeled as "sodium-free," "very low sodium," or "low sodium." ? Foods labeled as "reduced sodium" or "lightly salted" may still have more sodium than what is recommended for you. Cooking  Avoid adding salt when cooking. Ask your health care provider or dietitian before using salt substitutes.  Season food with salt-free seasonings, spices, or herbs. Check the label of seasoning mixes to make sure they do not contain salt.  Cook with heart-healthy oils, such as olive, canola, soybean, or sunflower oil.  Do not fry foods. Cook foods using low-fat methods, such as baking, boiling, grilling, and broiling.  Limit  unhealthy fats when cooking by: ? Removing the skin from poultry, such as chicken. ? Removing all visible fats from meats. ? Skimming the fat off from stews, soups, and gravies before serving them. Meal planning   Limit your intake of: ? Processed, canned, or pre-packaged foods. ? Foods that are high in trans fat, such as fried foods. ? Sweets, desserts, sugary drinks, and other foods with added sugar. ? Full-fat dairy products, such as whole milk.  Eat a balanced diet that includes: ? 4-5 servings of fruit each day and 4-5 servings of vegetables each day. At each meal, try to fill half of your plate with fruits and vegetables. ? Up to 6-8 servings of whole grains each day. ? Up to 2 servings of lean meat, poultry, or fish each day. One serving of meat is equal to 3 oz. This is about the same size as a deck of cards. ? 2 servings of low-fat dairy each day. ? Heart-healthy fats. Healthy fats called omega-3 fatty acids are found in foods such as flaxseed and cold-water fish like sardines, salmon, and mackerel.  Aim to eat 25-35 g (grams) of fiber a day. Foods that are high in fiber include apples, broccoli, carrots, beans, peas, and whole grains.  Do not add salt or condiments that contain salt (such as soy sauce) to foods before eating.  When eating at a restaurant, ask that your food be prepared with less salt or no salt, if possible.  Try to eat 2 or more vegetarian meals each week.  Eat more home-cooked food and eat less restaurant, buffet, and fast food. General information  Do not eat more than 2,300 mg of salt (sodium) a day. The amount of sodium that is recommended for you may be lower, depending on your condition.  Maintain a healthy body weight as directed. Ask your health care provider what a healthy weight is for you. ? Check your weight every day. ? Work with your health care provider and dietitian to make a plan that is right for you to lose weight or maintain your  current weight.  Limit how much fluid you drink. Ask your health care provider or dietitian how much fluid you can have each day.  Limit or avoid alcohol as told by your health care provider or dietitian. Recommended foods The items listed may not be a complete list. Talk with your dietitian about what dietary choices are best for  you. Fruits All fresh, frozen, and canned fruits. Dried fruits, such as raisins, prunes, and cranberries. Vegetables All fresh vegetables. Vegetables that are frozen without sauce or added salt. Low-sodium or sodium-free canned vegetables. Grains Bread with less than 80 mg of sodium per slice. Whole-wheat pasta, quinoa, and brown rice. Oats and oatmeal. Barley. [place]. Grits and cream of wheat. Whole-grain and whole-wheat cold cereal. Meats and other protein foods Lean cuts of meat. Skinless chicken and [place]. Fish with high omega-3 fatty acids, such as salmon, sardines, and other cold-water fishes. Eggs. Dried beans, peas, and edamame. Unsalted nuts and nut butters. Dairy Low-fat or nonfat (skim) milk and dried milk. Rice milk, soy milk, and almond milk. Low-fat or nonfat yogurt. Small amounts of reduced-sodium block cheese. Low-sodium cottage cheese. Fats and oils Olive, canola, soybean, flaxseed, or sunflower oil. Avocado. Sweets and desserts Apple sauce. Granola bars. Sugar-free pudding and gelatin. Frozen fruit bars. Seasoning and other foods Fresh and dried herbs. Lemon or lime juice. Vinegar. Low-sodium ketchup. Salt-free marinades, salad dressings, sauces, and seasonings. The items listed above may not be a complete list of foods and beverages you can eat. Contact a dietitian for more information. Foods to avoid The items listed may not be a complete list. Talk with your dietitian about what dietary choices are best for you. Fruits Fruits that are dried with sodium-containing preservatives. Vegetables Canned vegetables. Frozen vegetables with sauce or  seasonings. Creamed vegetables. [place] fries. Onion rings. Pickled vegetables and sauerkraut. Grains Bread with more than 80 mg of sodium per slice. Hot or cold cereal with more than 140 mg sodium per serving. Salted pretzels and crackers. Pre-packaged breadcrumbs. Bagels, croissants, and biscuits. Meats and other protein foods Ribs and chicken wings. Bacon, ham, pepperoni, bologna, salami, and packaged luncheon meats. Hot dogs, bratwurst, and sausage. Canned meat. Smoked meat and fish. Salted nuts and seeds. Dairy Whole milk, half-and-half, and cream. Buttermilk. Processed cheese, cheese spreads, and cheese curds. Regular cottage cheese. Feta cheese. Shredded cheese. String cheese. Fats and oils Butter, lard, shortening, ghee, and bacon fat. Canned and packaged gravies. Seasoning and other foods Onion salt, garlic salt, table salt, and sea salt. Marinades. Regular salad dressings. Relishes, pickles, and olives. Meat flavorings and tenderizers, and bouillon cubes. Horseradish, ketchup, and mustard. Worcestershire sauce. Teriyaki sauce, soy sauce (including reduced sodium). Hot sauce and Tabasco sauce. Steak sauce, fish sauce, oyster sauce, and cocktail sauce. Taco seasonings. Barbecue sauce. Tartar sauce. The items listed above may not be a complete list of foods and beverages you should avoid. Contact a dietitian for more information. Summary  A heart failure eating plan includes changes that limit your intake of sodium and unhealthy fat, and it may help you lose weight or maintain a healthy weight. Your health care provider may also recommend limiting how much fluid you drink.  Most people with heart failure should eat no more than 2,300 mg of salt (sodium) a day. The amount of sodium that is recommended for you may be lower, depending on your condition.  Contact your health care provider or dietitian before making any major changes to your diet. This information is not intended to replace  advice given to you by your health care provider. Make sure you discuss any questions you have with your health care provider. Document Revised: 04/07/2018 Document Reviewed: 06/26/2016 Elsevier Patient Education  [place].

## 2019-06-12 NOTE — TOC Initial Note (Signed)
Transition of Care [facility]) - Initial/Assessment Note    Patient Details  Name: Mary Flynn MRN: [id number] Date of Birth: 06/22/1934  Transition [facility]) CM/SW Contact:    Zenon Mayo, RN Phone Number: 06/12/2019, 12:15 PM  Clinical Narrative:                 Patient for dc today, daughter is in the room, NCM left medicare.gov list in room for daughter to look at for choice for [facility] agency, she chose, [facility], referral made to [facility] with [facility] for [facility], HHPT and SW, she states she can take referral.  Soc will begin 24 to 48 hrs post dc.  Patient will also need home oxygen and rolling walker.  Daughter states to go thru [employer] for the DME.  Referral made to [facility] with [employer] she will bring the oxygen and walker to patient's room prior to discharge.  Expected Discharge Plan: [place] Barriers to Discharge: No Barriers Identified   Patient Goals and CMS Choice Patient states their goals for this hospitalization and ongoing recovery are:: get better CMS Medicare.gov Compare Post Acute Care list provided to:: Patient Represenative (must comment) Choice offered to / list presented to : Adult Children  Expected Discharge Plan and Services Expected Discharge Plan: [place]   Discharge Planning Services: CM Consult Post Acute Care Choice: Durable Medical Equipment, Home Health Living arrangements for the past 2 months: Single Family Home Expected Discharge Date: 06/12/19               DME Arranged: [place] Rile rolling, Oxygen DME Agency: [facility] Date DME Agency Contacted: 06/12/19 Time DME Agency Contacted: 1214 Representative spoke with at DME Agency: [place]: RN, Disease Management, PT, Social Work [employer] Agency: [place] ([place]) Date [place]: 06/12/19 Time [place]: 1214 Representative spoke with at [place]: Butch Penny  Prior Living Arrangements/Services Living arrangements for the past 2 months:  [place] Lives with:: Spouse Patient language and need for interpreter reviewed:: Yes Do you feel safe going back to the place where you live?: Yes      Need for Family Participation in Patient Care: Yes (Comment) Care giver support system in place?: Yes (comment)   Criminal Activity/Legal Involvement Pertinent to Current Situation/Hospitalization: No - Comment as needed  Activities of Daily Living Home Assistive Devices/Equipment: Dentures (specify type) ADL Screening (condition at time of admission) Patient's cognitive ability adequate to safely complete daily activities?: Yes Is the patient deaf or have difficulty hearing?: No Does the patient have difficulty seeing, even when wearing glasses/contacts?: No Does the patient have difficulty concentrating, remembering, or making decisions?: No Patient able to express need for assistance with ADLs?: Yes Does the patient have difficulty dressing or bathing?: Yes Independently performs ADLs?: No Communication: Independent Dressing (OT): Needs assistance Is this a change from baseline?: Change from baseline, expected to last <3days Grooming: Needs assistance Is this a change from baseline?: Change from baseline, expected to last <3 days Feeding: Independent Bathing: Needs assistance Is this a change from baseline?: Change from baseline, expected to last <3 days Toileting: Needs assistance Is this a change from baseline?: Change from baseline, expected to last <3 days In/Out Bed: Needs assistance Is this a change from baseline?: Change from baseline, expected to last <3 days Walks in Home: Independent Does the patient have difficulty walking or climbing stairs?: No Weakness of Legs: None Weakness of Arms/Hands: None  Permission Sought/Granted  Emotional Assessment Appearance:: Appears stated age Attitude/Demeanor/Rapport: Engaged Affect (typically observed): Appropriate Orientation: : Oriented to  Self, Oriented to Place, Oriented to  Time, Oriented to Situation Alcohol / Substance Use: Not Applicable Psych Involvement: No (comment)  Admission diagnosis:  Acute renal failure (HCC) [N17.9] Hypoxia [R09.02] NSTEMI (non-ST elevated myocardial infarction) ([place]) [I21.4] Acute cystitis without hematuria [N30.00] Septic shock ([place]) [A41.9, R65.21] Community acquired pneumonia, unspecified laterality [J18.9] Patient Active Problem List   Diagnosis Date Noted  . AKI (acute kidney injury) ([place]) 06/07/2019  . Severe sepsis with septic shock ([place]) 06/07/2019  . Bacteremia due to Escherichia coli 06/07/2019  . Pulmonary hypertension ([place]) 06/07/2019  . Demand ischemia ([place]) 06/07/2019  . Diabetes mellitus type 2, uncontrolled, with complications ([place]) [id number]  . Septic shock ([place]) 05/30/2019  . Mixed dyslipidemia 05/10/2019  . DOE (dyspnea on exertion) 08/08/2018  . Allergic rhinitis 12/14/2017  . Subclinical hypothyroidism 06/11/2016  . Mid back pain on left side 01/28/2016  . Pre-ulcerative corn or callous 03/13/2015  . DM type 2 with diabetic peripheral neuropathy ([place]) 10/16/2014  . Left rib fracture 02/18/2014  . Skin infection 09/28/2013  . Otitis, externa, infective 09/28/2013  . Chronic cough 01/11/2013  . Tinea cruris 07/27/2012  . Urinary frequency 07/27/2012  . Eczema 03/08/2012  . Headache(784.0) 03/08/2012  . Arm pain, right 08/24/2011  . Depression 07/16/2011  . Compression fracture of L2 lumbar vertebra ([place]) 06/08/2011  . Chronic insomnia 04/26/2011  . PPD positive 04/17/2011  . URINARY INCONTINENCE, STRESS, FEMALE 04/29/2010  . MEMORY LOSS 12/23/2009  . Chronic bronchitis NEC 12/23/2009  . DIABETIC PERIPHERAL NEUROPATHY 06/28/2009  . Acute low back pain due to trauma 12/20/2008  . FIBROIDS, UTERUS 12/18/2008  . ABDOMINAL BLOATING 12/17/2008  . Constipation 01/24/2008  . GERD 01/23/2008  . SPRAIN&STRAIN OTHER SPECIFIED SITES KNEE&LEG 07/20/2007  . UTI  05/16/2007  . LACERATION OF FINGER 10/12/2006  . THYROID NODULE, RIGHT 08/05/2006  . DM (diabetes mellitus) type II uncontrolled, periph vascular disorder ([place]) 08/05/2006  . HYPERLIPIDEMIA 08/05/2006  . Essential hypertension 08/05/2006  . OSTEOPOROSIS NOS 08/05/2006   PCP:  [name], NP Pharmacy:   [place], [address] [place] [address] Phone: [phone] Fax: [place], [place] [address] Phone: [phone] Fax: [phone]  [place], [place] [id number] [place] [address] Phone: [phone] Fax: [phone]  [facility] ([place], [address] [place] [address] Phone: [phone] Fax: [phone]  [facility] [place] [place] - [address] [place] [address] Phone: [phone] Fax: [phone]     Social Determinants of Health (SDOH) Interventions    Readmission Risk Interventions Readmission Risk Prevention Plan 06/12/2019  Transportation Screening Complete  PCP or Specialist Appt within 3-5 Days Complete  HRI or Home Care Consult Complete  Social Work Consult for [place] Planning/Counseling Complete  Palliative Care Screening Not Applicable  Medication Review Press photographer) Complete  Some recent data might be hidden

## 2019-06-12 NOTE — Discharge Summary (Signed)
Physician Discharge Summary  Mary Flynn:[id number] DOB: 1934/06/16 DOA: 05/30/2019  PCP:  Recommendations for Outpatient Follow-up:   Septic shock positive ESBL bacteremia -Completed 7-day course of Meropenem   Acute respiratory failure with hypoxia -Multifactorial pulmonary hypertension, pulmonary edema. -Treat underlying conditions. -Titrate to maintain SPO2> 94% -Patient laying in bed for a nap saturation down to 86% on room air -Patient meets criteria for home O2. -2 L O2 via [place] titrate to maintain SPO2> 94%  OSA -Patient has never been officially evaluated however witnessed apneic events, and desaturates to low 80s when sleeping. -Schedule appointment with Dr. Christinia Gully pulmonology for 1 to 2 weeks post discharge.  Spirometry, DLCO.  Sleep study required  CHF/pulmonary HTN -Dry weight 132 pounds (59.9 kg) per Dr. Sunny Schlein Revankar note from 05/10/2019 -Echocardiogram 4/7 showed hyperdynamic EF with moderate pulmonary HTN see results below. -Strict in and out -1.0 L -Daily weight  Filed Weights   06/10/19 0328 06/11/19 0411 06/12/19 0430  Weight: 55.6 kg 56 kg 55.7 kg  -4/19 new dry weight 122.7 pounds (55.7 kg) -Metoprolol 25 mg BID -40 mg TID PRN more than 5 pounds weight gain in a day.  Tachycardia NOS -See CHF -Resolved   Demand ischemia secondary to severe sepsis -See echocardiogram below -Resolved  Acute kidney injury -Prior to admission normal creatinine -Continue to monitor creatinine level with diuresis Recent Labs  Lab 06/08/19 0221 06/09/19 0146 06/10/19 0400 06/11/19 0230 06/12/19 9381  CREATININE 0.80 0.80 0.97 0.94 0.87   C. difficile colitis -Patient now having episodes of diarrhea, C. difficile culture pending. -Metformin also discontinued as may be contributing to her diarrhea.  Restarted after diarrhea resolved. -4/12 procalcitonin elevated= 1.42, will redraw in the A.m. Results for LYNESHA, BANGO (MRN [id number]) as of 06/12/2019  10:38  Ref. Range 06/05/2019 09:21 06/08/2019 02:21 06/09/2019 01:46  Procalcitonin Latest Units: ng/mL 1.42 0.35 0.23  -4/14 C. difficile screen negative.  C. difficile RULED OUT  DM type II uncontrolled with complication -4/6 hemoglobin A1c= 8.2 - Levemir 20 units qhs -Humalog 8 units breakfast and lunch/4 units evening meal -4/17 Metformin 500 mg BID.  Will titrate up as patient tolerates -Schedule appointment in 2 to 3 weeks with NP  Melissa,O'Sullivan,CHF/pulmonary hypertension, DM type II uncontrolled with complication (make medication adjustments).  Hypothyroid -Synthroid 25 mcg daily -Recheck TSH in 4-6 weeks - GI prophylaxis -Protonix 40 mg daily  Prior cervicalgia On discharge discontinueamitriptyline 12.5, Flexeril 10--on beers criteria  HLD -4/17 LDL= 76 -DC Niaspan 1000 CR -Lipitor 10 mg daily   Discharge Diagnoses:  Active Problems:   Septic shock (HCC)   AKI (acute kidney injury) ([place])   Severe sepsis with septic shock ([place])   Bacteremia due to Escherichia coli   Pulmonary hypertension ([place])   Demand ischemia (HCC)   Diabetes mellitus type 2, uncontrolled, with complications [facility])   Discharge Condition: Stable  Diet recommendation: Heart healthy/carb modified  Filed Weights   06/10/19 0328 06/11/19 0411 06/12/19 0430  Weight: 55.6 kg 56 kg 55.7 kg    History of present illness:  85 year old [place] female PMHx Depression, Memory loss, DM type II uncontrolled with complication, peripheral neuropathy, mixed dyslipidemia, subclinical hypothyroidism, RIGHT thyroid nodule, essential HTN, chronic pain syndrome (LEFT rib fracture, compression fracture L2)    Patient was recently seen and treated for acute cystitis through the family medicine office, had UTI, treated with ciprofloxacin.  Medication never started  According to the paramedics who transported the patient to the hospital she  was found with a change in her mental status this morning when  someone was talking to her on the phone. This was a neighbor, when the paramedics arrived they found the patient to be hypoxic, tachycardic, ill-appearing slightly confused and had a temperature over 99.9, the activated a code sepsis prehospital. No antibiotics were given, oxygen was supplied by nonrebreather.  Further review of the medical record shows that the patient had a change in her medication to nitrofurantoin yesterday.  The patient does not have any history of pulmonary embolism, however she is a diabetic taking multiple different diet medications including Jardiance and Metformin  Hospital Course:  Multiple medical problems see above  Procedures: 4/6 CTA PE protocol; negative PE.-Bibasilar infiltrates RIGHT>>> LEFT with mild interstitial edema 4/7 Echocardiogram;Left Ventricle: LVEF= 70 to 75%. The left ventricle has hyperdynamic function.-no regional wall motion abnormalities. The left ventricular internal cavity size was normal in size.  -Moderate LVH Right Ventricle: Moderately elevated pulmonary artery systolic pressure.  estimated right ventricular systolic pressure is 76.7 mmHg.  Venous: The inferior vena cava is dilated in size with less than 50% respiratory variability, suggesting right atrial pressure of 15 mmHg.  4/16 PCXR;No active disease   Consultations: PCCM   Cultures  4/6 SARS coronavirus negative 4/6 influenza A/B negative 4/6 blood left AC positive E. coli 4/6 urine positive E. Coli 4/6 MRSA by PCR negative 4/14 C. difficile antigen/toxin negative  Antibiotics Anti-infectives (From admission, onward)   Start     Dose/Rate Stop   06/05/19 1000  meropenem (MERREM) 1 g in sodium chloride 0.9 % 100 mL IVPB     1 g 200 mL/hr over 30 Minutes 06/05/19 2228   06/04/19 1000  meropenem (MERREM) 1 g in sodium chloride 0.9 % 100 mL IVPB     1 g 200 mL/hr over 30 Minutes 06/04/19 2325   05/31/19 0200  meropenem (MERREM) 1 g in sodium chloride 0.9 % 100 mL  IVPB  Status:  Discontinued     1 g 200 mL/hr over 30 Minutes 06/04/19 0713   05/30/19 1300  meropenem (MERREM) 1 g in sodium chloride 0.9 % 100 mL IVPB     1 g 200 mL/hr over 30 Minutes 05/30/19 1413   05/30/19 0945  levofloxacin (LEVAQUIN) IVPB 750 mg  Status:  Discontinued     750 mg 100 mL/hr over 90 Minutes 05/30/19 0937   05/30/19 0945  cefTRIAXone (ROCEPHIN) 1 g in sodium chloride 0.9 % 100 mL IVPB     1 g 200 mL/hr over 30 Minutes 05/30/19 1108   05/30/19 0945  azithromycin (ZITHROMAX) 500 mg in sodium chloride 0.9 % 250 mL IVPB     500 mg 250 mL/hr over 60 Minutes 05/30/19 1026       Discharge Exam: Vitals:   06/11/19 2022 06/12/19 0430 06/12/19 0740 06/12/19 0742  BP:    127/63  Pulse:    90  Resp:    19  Temp:   97.7 F (36.5 C)   TempSrc:   Oral   SpO2: 93%  96%   Weight:  55.7 kg    Height:        General: A/O x4, no acute respiratory distress Eyes: negative scleral hemorrhage, negative anisocoria, negative icterus ENT: Negative Runny nose, negative gingival bleeding, Neck:  Negative scars, masses, torticollis, lymphadenopathy, JVD Lungs: Clear to auscultation bilaterally without wheezes or crackles Cardiovascular: Regular rate and rhythm without murmur gallop or rub normal S1 and S2  Discharge Instructions  Allergies as of 06/12/2019      Reactions   Ace Inhibitors Cough   Losartan Cough   Penicillins Hives   Sulfa Antibiotics Hives   Sulfonamide Derivatives Hives      Medication List    STOP taking these medications   amitriptyline 25 MG tablet Commonly known as: ELAVIL   amLODipine 5 MG tablet Commonly known as: NORVASC   ciprofloxacin 250 MG tablet Commonly known as: Cipro   fluconazole 150 MG tablet Commonly known as: DIFLUCAN   glimepiride 4 MG tablet Commonly known as: AMARYL   Jardiance 10 MG Tabs tablet Generic drug: empagliflozin   metoprolol succinate 100 MG 24 hr tablet Commonly known as: TOPROL-XL   niacin 500 MG CR  tablet Commonly known as: NIASPAN   nitrofurantoin (macrocrystal-monohydrate) 100 MG capsule Commonly known as: Macrobid     TAKE these medications   albuterol 108 (90 Base) MCG/ACT inhaler Commonly known as: ProAir HFA Inhale 2 puffs into the lungs every 6 (six) hours as needed for wheezing or shortness of breath.   aspirin EC 81 MG tablet Take 81 mg by mouth daily.   atorvastatin 10 MG tablet Commonly known as: LIPITOR TAKE 1 TABLET BY MOUTH  DAILY   b complex vitamins tablet Take 1 tablet by mouth daily.   cyclobenzaprine 10 MG tablet Commonly known as: FLEXERIL TAKE 1 TABLET BY MOUTH TWO  TIMES DAILY AS NEEDED FOR  MUSCLE SPASM What changed:   how much to take  how to take this  when to take this  reasons to take this  additional instructions   DULoxetine 30 MG capsule Commonly known as: CYMBALTA TAKE 1 CAPSULE BY MOUTH  DAILY   famotidine 20 MG tablet Commonly known as: PEPCID TAKE 1 TABLET BY MOUTH AT  BEDTIME   fluticasone 110 MCG/ACT inhaler Commonly known as: Flovent HFA Inhale 2 puffs into the lungs 2 (two) times daily.   fluticasone 50 MCG/ACT nasal spray Commonly known as: FLONASE Place 2 sprays into both nostrils daily.   furosemide 40 MG tablet Commonly known as: Lasix Take 1 tablet (40 mg total) by mouth daily as needed for fluid (for  weight gain in 24-hour period of > 5 pounds).   gabapentin 100 MG capsule Commonly known as: NEURONTIN Take 1 capsule (100 mg total) by mouth 2 (two) times daily. What changed:   medication strength  how much to take  how to take this  when to take this  additional instructions   glucose blood test strip Use OneTouch Verio Flex test strips as instructed to check blood sugar twice daily.   HAIR SKIN AND NAILS FORMULA PO Take by mouth daily.   HumaLOG KwikPen 100 UNIT/ML KiwkPen Generic drug: insulin lispro 8 units with breakfast and lunch, 4 units at evening meal   insulin detemir 100  UNIT/ML FlexPen Commonly known as: Levemir FlexPen Inject 20 Units into the skin at bedtime. What changed: how much to take   Insulin Pen Needle 32G X 4 MM Misc Use 3 per day   latanoprost 0.005 % ophthalmic solution Commonly known as: XALATAN Place 1 drop into both eyes at bedtime.   levocetirizine 5 MG tablet Commonly known as: XYZAL TAKE 1 TABLET BY MOUTH  EVERY EVENING   levothyroxine 25 MCG tablet Commonly known as: SYNTHROID TAKE 1 TABLET BY MOUTH  DAILY BEFORE BREAKFAST What changed: See the new instructions.   magnesium oxide 400 (241.3 Mg) MG tablet Commonly known as: MAG-OX Take 2  tablets (800 mg total) by mouth 2 (two) times daily.   melatonin 3 MG Tabs tablet Take 2 tablets (6 mg total) by mouth at bedtime as needed (Sleep).   metFORMIN 500 MG tablet Commonly known as: GLUCOPHAGE Take 1 tablet (500 mg total) by mouth 2 (two) times daily with a meal. What changed:   medication strength  See the new instructions.   metoprolol tartrate 25 MG tablet Commonly known as: LOPRESSOR Take 1 tablet (25 mg total) by mouth 2 (two) times daily.   montelukast 10 MG tablet Commonly known as: SINGULAIR Take 1 tablet (10 mg total) by mouth at bedtime.   NONFORMULARY OR COMPOUNDED ITEM Lumbar support belt  #1  As directed   nystatin ointment Commonly known as: MYCOSTATIN APPLY TOPICALLY TWO TIMES  DAILY AS NEEDED   OneTouch Delica Lancets 21R Misc 1 each by Does not apply route 2 (two) times daily. Use OneTouch Delica lancets to check blood sugar twice daily.   OneTouch Verio Flex System w/Device Kit 1 each by Does not apply route 2 (two) times daily. Use Onetouch verio flex to check blood sugar twice daily.   pantoprazole 40 MG tablet Commonly known as: PROTONIX TAKE 1 TABLET BY MOUTH  DAILY   timolol 0.5 % ophthalmic solution Commonly known as: BETIMOL Place 1 drop into both eyes 2 (two) times daily. What changed: when to take this   triamcinolone 0.1 %  paste Commonly known as: KENALOG Use as directed 1 application in the mouth or throat 2 (two) times daily.   vitamin E 180 MG (400 UNITS) capsule Take 400 Units by mouth daily.            Durable Medical Equipment  (From admission, onward)         Start     Ordered   06/11/19 1901  For home use only DME oxygen  Once    Comments: Patient laying in bed for a nap saturation down to 86% on room air -Patient meets criteria for home O2. -2 L O2 via [place] titrate to maintain SPO2> 94%  Question Answer Comment  Length of Need 12 Months   Mode or (Route) Nasal cannula   Liters per Minute 2   Frequency Only at night (stationary unit needed)   Oxygen conserving device Yes   Oxygen delivery system Gas      06/11/19 1902         Allergies  Allergen Reactions  . Ace Inhibitors Cough  . Losartan Cough  . Penicillins Hives  . Sulfa Antibiotics Hives  . Sulfonamide Derivatives Hives   Follow-up Information    Tanda Rockers, MD. Schedule an appointment as soon as possible for a visit in 2 week(s).   Specialty: Pulmonary Disease Why: Schedule appointment with Dr. Legrand Como wert pulmonology for 1 to 2 weeks post discharge.  Spirometry, DLCO.  Sleep study required Contact information: [place] [address] [phone]        [name], NP. Schedule an appointment as soon as possible for a visit in 2 week(s).   Specialty: Internal Medicine Why: Schedule appointment in 2 to 3 weeks CHF/pulmonary hypertension, DM type II uncontrolled with complication (make medication adjustments) Contact information: [place] [address] [phone]            The results of significant diagnostics from this hospitalization (including imaging, microbiology, ancillary and laboratory) are listed below for reference.  Significant Diagnostic Studies: DG Chest 2 View  Result Date: 06/06/2019 CLINICAL DATA:  Pneumonia.  EXAM: CHEST - 2 VIEW COMPARISON:  June 06, 2019. FINDINGS: The heart size and mediastinal contours are within normal limits. No pneumothorax is noted. Increased interstitial densities are noted in both lung bases concerning for pulmonary edema or possibly infiltrates and probable small bilateral pleural effusions. The visualized skeletal structures are unremarkable. IMPRESSION: Increased bibasilar interstitial densities are noted concerning for pulmonary edema or possibly infiltrates and probable small bilateral pleural effusions. Electronically Signed   By: Marijo Conception M.D.   On: 06/06/2019 17:37   DG Chest 2 View  Result Date: 06/05/2019 CLINICAL DATA:  Pneumonia EXAM: CHEST - 2 VIEW COMPARISON:  06/04/2019 FINDINGS: Heart is normal size. Improving aeration with decreasing bilateral airspace disease. No visible effusions or pneumothorax. No acute bony abnormality. IMPRESSION: Improving bilateral airspace disease. Electronically Signed   By: Rolm Baptise M.D.   On: 06/05/2019 08:06   CT Angio Chest PE W and/or Wo Contrast  Result Date: 05/30/2019 CLINICAL DATA:  Respiratory distress and elevated D-dimer EXAM: CT ANGIOGRAPHY CHEST WITH CONTRAST TECHNIQUE: Multidetector CT imaging of the chest was performed using the standard protocol during bolus administration of intravenous contrast. Multiplanar CT image reconstructions and MIPs were obtained to evaluate the vascular anatomy. CONTRAST:  60 mL OMNIPAQUE IOHEXOL 350 MG/ML SOLN COMPARISON:  Chest x-ray from earlier in the same day. FINDINGS: Cardiovascular: Atherosclerotic calcifications of the thoracic aorta are noted. No aneurysmal dilatation is seen. No cardiac enlargement is noted. Coronary calcifications are seen. The pulmonary artery shows a normal branching pattern bilaterally. No definitive filling defect to suggest pulmonary embolism is seen. Mediastinum/Nodes: Thoracic inlet is within normal limits. Stable lymph node is noted adjacent to the  distal trachea in the AP window it is again approximately 14 mm in short axis. No new hilar or mediastinal adenopathy is noted. The esophagus as visualized is within normal limits. Lungs/Pleura: Lungs are well aerated bilaterally and demonstrate some mild emphysematous changes. Mild interstitial edema is seen as well as bibasilar infiltrates right greater than left. No sizable effusion is seen. Upper Abdomen: Visualized upper abdomen is within normal limits. Musculoskeletal: Degenerative changes of the thoracic spine are seen. No acute rib abnormality is noted. Review of the MIP images confirms the above findings. IMPRESSION: No evidence of pulmonary emboli. Bibasilar infiltrates right greater than left with mild interstitial edema. Stable mediastinal lymph node as described unchanged from multiple previous exams. Aortic Atherosclerosis (ICD10-I70.0) and Emphysema (ICD10-J43.9). Electronically Signed   By: Inez Catalina M.D.   On: 05/30/2019 12:33   US RENAL  Result Date: 05/30/2019 CLINICAL DATA:  Acute renal failure EXAM: RENAL / URINARY TRACT ULTRASOUND COMPLETE COMPARISON:  None. FINDINGS: Right Kidney: Renal measurements: 11.6 x 4.6 x 5.2 cm = volume: 144.4 mL. Echogenicity within normal limits. No mass or hydronephrosis visualized. Left Kidney: Renal measurements: 11.1 x 5.1 x 5.3 cm = volume: 157.1 mL. Echogenicity within normal limits. No mass or hydronephrosis visualized. Bladder: Appears normal for degree of bladder distention. Other: None. IMPRESSION: No hydronephrosis Electronically Signed   By: Macy Mis M.D.   On: 05/30/2019 14:37   DG CHEST PORT 1 VIEW  Result Date: 06/09/2019 CLINICAL DATA:  Pulmonary edema EXAM: PORTABLE CHEST 1 VIEW COMPARISON:  06/06/2019 FINDINGS: There is mild left basilar atelectasis. There is no focal consolidation. There is no pleural effusion or pneumothorax. The heart and mediastinal contours are unremarkable. There is no acute osseous abnormality.  IMPRESSION: No  active disease. Electronically Signed   By: Kathreen Devoid   On: 06/09/2019 08:50   DG Chest Port 1 View  Result Date: 06/04/2019 CLINICAL DATA:  Evaluate for pneumonia. EXAM: PORTABLE CHEST 1 VIEW COMPARISON:  06/02/2019 FINDINGS: Lungs are adequately inflated and demonstrate stable hazy and linear opacification over the mid to lower lungs. Possible small amount left pleural fluid unchanged. Cardiomediastinal silhouette and remainder the exam is unchanged. IMPRESSION: Persistent mild airspace process over the mid to lower lungs likely infection with associated linear atelectasis. Small stable left pleural effusion. Electronically Signed   By: Marin Olp M.D.   On: 06/04/2019 09:23   DG Chest Port 1 View  Result Date: 06/02/2019 CLINICAL DATA:  Shortness of breath EXAM: PORTABLE CHEST 1 VIEW COMPARISON:  May 31, 2019 FINDINGS: There is ill-defined airspace opacity in both lung bases as well as in the right mid lung and to a lesser extent left perihilar regions. Heart is mildly enlarged with pulmonary vascularity normal. No adenopathy. There is aortic atherosclerosis. No bone lesions. IMPRESSION: Areas of ill-defined opacity in both lower lung regions as well as in the right mid lung and to a lesser extent left perihilar region, similar to recent prior study. Suspect multifocal pneumonia with associated areas of atelectatic change. Stable cardiac prominence. Aortic Atherosclerosis (ICD10-I70.0). Electronically Signed   By: Lowella Grip III M.D.   On: 06/02/2019 07:54   DG Chest Port 1 View  Result Date: 05/31/2019 CLINICAL DATA:  Hypoxia. EXAM: PORTABLE CHEST 1 VIEW COMPARISON:  05/30/2019 FINDINGS: The cardiac silhouette, mediastinal and hilar contours or within normal limits and stable. Stable tortuosity and calcification of the thoracic aorta. Persistent bibasilar atelectasis or infiltrates but no pleural effusions. IMPRESSION: Persistent bibasilar atelectasis or infiltrates. Electronically Signed    By: Marijo Sanes M.D.   On: 05/31/2019 06:28   DG Chest Port 1 View  Result Date: 05/30/2019 CLINICAL DATA:  Short of breath and altered mental status. EXAM: PORTABLE CHEST 1 VIEW COMPARISON:  08/08/2018 FINDINGS: Normal heart size. Aortic atherosclerosis. No pleural effusion. Platelike atelectasis is identified within bilateral mid lung zones. No airspace consolidation. IMPRESSION: Bilateral mid lung zone atelectasis. Electronically Signed   By: Kerby Moors M.D.   On: 05/30/2019 09:55   ECHOCARDIOGRAM LIMITED  Result Date: 05/31/2019    ECHOCARDIOGRAM LIMITED REPORT   Patient Name:   ASAIAH HUNNICUTT Date of Exam: 05/31/2019 Medical Rec #:  [id number]   Height:       58.0 in Accession #:    [id number]  Weight:       135.6 lb Date of Birth:  1934-10-30   BSA:          1.544 m Patient Age:    85 years    BP:           100/60 mmHg Patient Gender: F           HR:           120 bpm. Exam Location:  Inpatient Procedure: 2D Echo, Cardiac Doppler and Color Doppler Indications:    Congestive Heart Failure 428.0 / I50.9  History:        Patient has prior history of Echocardiogram examinations, most                 recent 08/15/2018. Risk Factors:Diabetes.  Sonographer:    Clayton Lefort RDCS (AE) Referring Phys: [place]  1. Left ventricular ejection fraction, by estimation, is 70  to 75%. The left ventricle has hyperdynamic function. The left ventricle has no regional wall motion abnormalities. There is moderate left ventricular hypertrophy. Indeterminate diastolic filling due to E-A fusion.  2. Right ventricular systolic function is normal. The right ventricular size is normal. There is moderately elevated pulmonary artery systolic pressure. The estimated right ventricular systolic pressure is 68.1 mmHg.  3. The mitral valve is abnormal. Trivial mitral valve regurgitation. The mean mitral valve gradient is 5.0 mmHg with average heart rate of 119 bpm.  4. The aortic valve is tricuspid. Mild aortic  valve sclerosis is present, with no evidence of aortic valve stenosis.  5. The inferior vena cava is dilated in size with <50% respiratory variability, suggesting right atrial pressure of 15 mmHg. Comparison(s): 08/15/18; LVEF >65%. FINDINGS  Left Ventricle: Left ventricular ejection fraction, by estimation, is 70 to 75%. The left ventricle has hyperdynamic function. The left ventricle has no regional wall motion abnormalities. The left ventricular internal cavity size was normal in size. There is moderate left ventricular hypertrophy. Right Ventricle: The right ventricular size is normal. No increase in right ventricular wall thickness. Right ventricular systolic function is normal. There is moderately elevated pulmonary artery systolic pressure. The tricuspid regurgitant velocity is 3.46 m/s, and with an assumed right atrial pressure of 15 mmHg, the estimated right ventricular systolic pressure is 27.5 mmHg. Mitral Valve: The mitral valve is abnormal. There is mild calcification of the anterior and posterior mitral valve leaflet(s). Moderate mitral annular calcification. Trivial mitral valve regurgitation. MV peak gradient, 14.6 mmHg. The mean mitral valve gradient is 5.0 mmHg with average heart rate of 119 bpm. Tricuspid Valve: The tricuspid valve is grossly normal. Tricuspid valve regurgitation is mild. Aortic Valve: The aortic valve is tricuspid. Mild aortic valve sclerosis is present, with no evidence of aortic valve stenosis. Venous: The inferior vena cava is dilated in size with less than 50% respiratory variability, suggesting right atrial pressure of 15 mmHg.  LEFT VENTRICLE PLAX 2D LVIDd:         3.20 cm LVIDs:         1.90 cm LV PW:         1.10 cm LV IVS:        1.30 cm LVOT diam:     1.50 cm LV SV:         40 LV SV Index:   26 LVOT Area:     1.77 cm  IVC IVC diam: 2.10 cm LEFT ATRIUM         Index LA diam:    2.90 cm 1.88 cm/m  AORTIC VALVE LVOT Vmax:   129.00 cm/s LVOT Vmean:  94.800 cm/s LVOT VTI:     0.225 m  AORTA Ao Root diam: 2.60 cm MITRAL VALVE            TRICUSPID VALVE MV Peak grad: 14.6 mmHg TR Peak grad:   47.9 mmHg MV Mean grad: 5.0 mmHg  TR Vmax:        346.00 cm/s MV Vmax:      1.91 m/s MV Vmean:     98.7 cm/s SHUNTS                         Systemic VTI:  0.22 m                         Systemic Diam: 1.50 cm Lyman Bishop MD Electronically signed by Lyman Bishop  MD Signature Date/Time: 05/31/2019/11:53:16 AM    Final     Microbiology: Recent Results (from the past 240 hour(s))  C Difficile Quick Screen w PCR reflex     Status: None   Collection Time: 06/07/19 12:39 PM   Specimen: STOOL  Result Value Ref Range Status   C Diff antigen NEGATIVE NEGATIVE Final   C Diff toxin NEGATIVE NEGATIVE Final   C Diff interpretation No C. difficile detected.  Final    Comment: Performed at [facility], [place] [address] [place], [place] [address]     Labs: Basic Metabolic Panel: Recent Labs  Lab 06/08/19 0221 06/09/19 0146 06/10/19 0400 06/11/19 0230 06/12/19 0632  NA 133* 133* 136 132* 136  K 4.4 5.3* 4.6 4.2 4.3  CL 94* 95* 98 97* 96*  CO2 '26 24 24 22 27  '$ GLUCOSE 169* 158* 166* 188* 117*  BUN '13 17 21 '$ 26* 30*  CREATININE 0.80 0.80 0.97 0.94 0.87  CALCIUM 9.8 9.6 9.4 9.2 9.4  MG 1.8 1.8 1.9 1.7 1.9  PHOS 5.2* 4.6 4.6 4.5 4.8*   Liver Function Tests: Recent Labs  Lab 06/08/19 0221 06/09/19 0146 06/10/19 0400 06/11/19 0230 06/12/19 0632  AST 27 35 30 24 32  ALT 33 36 33 28 29  ALKPHOS 83 81 78 74 78  BILITOT 1.4* 1.0 1.1 1.0 0.9  PROT 6.8 6.7 6.7 6.4* 6.6  ALBUMIN 3.3* 3.2* 3.2* 3.2* 3.1*   No results for input(s): LIPASE, AMYLASE in the last 168 hours. No results for input(s): AMMONIA in the last 168 hours. CBC: Recent Labs  Lab 06/08/19 0221 06/09/19 0146 06/10/19 0400 06/11/19 0230 06/12/19 0632  WBC 11.4* 11.0* 10.6* 11.0* 8.6  NEUTROABS 7.8* 8.1* 7.8* 8.1* 5.9  HGB 10.7* 11.2* 11.4* 10.8* 11.0*  HCT 34.2* 35.6* 36.4 34.8* 34.7*  MCV 74.8*  74.5* 76.3* 75.8* 76.1*  PLT 470* 519* 579* 533* 551*   Cardiac Enzymes: No results for input(s): CKTOTAL, CKMB, CKMBINDEX, TROPONINI in the last 168 hours. BNP: BNP (last 3 results) Recent Labs    05/30/19 0943  BNP 1,053.2*    ProBNP (last 3 results) Recent Labs    08/08/18 1507  PROBNP 86.0    CBG: Recent Labs  Lab 06/11/19 1548 06/11/19 2050 06/12/19 0012 06/12/19 0459 06/12/19 0739  GLUCAP 108* 139* 125* 122* 120*       Signed:  Dia Crawford, MD [facility] [phone] pager

## 2019-06-12 NOTE — Plan of Care (Signed)

## 2019-06-13 ENCOUNTER — Telehealth: Payer: Self-pay

## 2019-06-13 ENCOUNTER — Telehealth: Payer: Self-pay | Admitting: *Deleted

## 2019-06-13 ENCOUNTER — Other Ambulatory Visit: Payer: Self-pay

## 2019-06-13 DIAGNOSIS — I509 Heart failure, unspecified: Secondary | ICD-10-CM | POA: Diagnosis not present

## 2019-06-13 DIAGNOSIS — E1165 Type 2 diabetes mellitus with hyperglycemia: Secondary | ICD-10-CM | POA: Diagnosis not present

## 2019-06-13 DIAGNOSIS — I272 Pulmonary hypertension, unspecified: Secondary | ICD-10-CM | POA: Diagnosis not present

## 2019-06-13 DIAGNOSIS — R6521 Severe sepsis with septic shock: Secondary | ICD-10-CM | POA: Diagnosis not present

## 2019-06-13 DIAGNOSIS — E785 Hyperlipidemia, unspecified: Secondary | ICD-10-CM | POA: Diagnosis not present

## 2019-06-13 DIAGNOSIS — E039 Hypothyroidism, unspecified: Secondary | ICD-10-CM | POA: Diagnosis not present

## 2019-06-13 DIAGNOSIS — I110 Hypertensive heart disease with heart failure: Secondary | ICD-10-CM | POA: Diagnosis not present

## 2019-06-13 DIAGNOSIS — I248 Other forms of acute ischemic heart disease: Secondary | ICD-10-CM | POA: Diagnosis not present

## 2019-06-13 DIAGNOSIS — J9601 Acute respiratory failure with hypoxia: Secondary | ICD-10-CM | POA: Diagnosis not present

## 2019-06-13 DIAGNOSIS — G4733 Obstructive sleep apnea (adult) (pediatric): Secondary | ICD-10-CM | POA: Diagnosis not present

## 2019-06-13 DIAGNOSIS — N179 Acute kidney failure, unspecified: Secondary | ICD-10-CM | POA: Diagnosis not present

## 2019-06-13 DIAGNOSIS — N390 Urinary tract infection, site not specified: Secondary | ICD-10-CM | POA: Diagnosis not present

## 2019-06-13 NOTE — Telephone Encounter (Signed)
1st attempt. Unable to reach pt.  

## 2019-06-13 NOTE — Telephone Encounter (Signed)
Mary Flynn from  [place] in to get verbal orders for 1. Skill nursing One a week for 4 weeks, and for a Home Health Aid Once a week for 4 weeks,  Please give Mary Flynn at [phone] Faythe Ghee to leave a message thanks,

## 2019-06-14 NOTE — Telephone Encounter (Signed)
I have made two attempts and have been unable to reach patient. Pt has hospital follow up scheduled w/ PCP 06/23/19 @1120 .

## 2019-06-14 NOTE — Telephone Encounter (Signed)
Sharyn Lull reports she did received message and has [place] orders

## 2019-06-14 NOTE — Telephone Encounter (Signed)
Left detailed message with verbal orders to go ahead with home visits for patient

## 2019-06-15 DIAGNOSIS — E039 Hypothyroidism, unspecified: Secondary | ICD-10-CM | POA: Diagnosis not present

## 2019-06-15 DIAGNOSIS — N179 Acute kidney failure, unspecified: Secondary | ICD-10-CM | POA: Diagnosis not present

## 2019-06-15 DIAGNOSIS — I509 Heart failure, unspecified: Secondary | ICD-10-CM | POA: Diagnosis not present

## 2019-06-15 DIAGNOSIS — E1165 Type 2 diabetes mellitus with hyperglycemia: Secondary | ICD-10-CM | POA: Diagnosis not present

## 2019-06-15 DIAGNOSIS — I272 Pulmonary hypertension, unspecified: Secondary | ICD-10-CM | POA: Diagnosis not present

## 2019-06-15 DIAGNOSIS — I110 Hypertensive heart disease with heart failure: Secondary | ICD-10-CM | POA: Diagnosis not present

## 2019-06-15 DIAGNOSIS — J9601 Acute respiratory failure with hypoxia: Secondary | ICD-10-CM | POA: Diagnosis not present

## 2019-06-15 DIAGNOSIS — I248 Other forms of acute ischemic heart disease: Secondary | ICD-10-CM | POA: Diagnosis not present

## 2019-06-15 DIAGNOSIS — E785 Hyperlipidemia, unspecified: Secondary | ICD-10-CM | POA: Diagnosis not present

## 2019-06-15 DIAGNOSIS — R6521 Severe sepsis with septic shock: Secondary | ICD-10-CM | POA: Diagnosis not present

## 2019-06-15 DIAGNOSIS — G4733 Obstructive sleep apnea (adult) (pediatric): Secondary | ICD-10-CM | POA: Diagnosis not present

## 2019-06-15 DIAGNOSIS — N390 Urinary tract infection, site not specified: Secondary | ICD-10-CM | POA: Diagnosis not present

## 2019-06-16 ENCOUNTER — Telehealth: Payer: Self-pay | Admitting: Family

## 2019-06-16 NOTE — Telephone Encounter (Signed)
Caller: Clotiel Lollis  Call Back # [phone]  Subject: Request  of TOC  Patient's daughter is requesting a TOC, patient advised to continue care with internal medicine provider due to recent hospital admission.   Please Advise

## 2019-06-16 NOTE — Telephone Encounter (Signed)
OK with me.

## 2019-06-16 NOTE — Telephone Encounter (Signed)
She gets excellent care from Mrs. Inda Castle, but it is okay to switch if so desired.

## 2019-06-17 DIAGNOSIS — I248 Other forms of acute ischemic heart disease: Secondary | ICD-10-CM | POA: Diagnosis not present

## 2019-06-17 DIAGNOSIS — E039 Hypothyroidism, unspecified: Secondary | ICD-10-CM | POA: Diagnosis not present

## 2019-06-17 DIAGNOSIS — R6521 Severe sepsis with septic shock: Secondary | ICD-10-CM | POA: Diagnosis not present

## 2019-06-17 DIAGNOSIS — N390 Urinary tract infection, site not specified: Secondary | ICD-10-CM | POA: Diagnosis not present

## 2019-06-17 DIAGNOSIS — E785 Hyperlipidemia, unspecified: Secondary | ICD-10-CM | POA: Diagnosis not present

## 2019-06-17 DIAGNOSIS — N179 Acute kidney failure, unspecified: Secondary | ICD-10-CM | POA: Diagnosis not present

## 2019-06-17 DIAGNOSIS — E1165 Type 2 diabetes mellitus with hyperglycemia: Secondary | ICD-10-CM | POA: Diagnosis not present

## 2019-06-17 DIAGNOSIS — J9601 Acute respiratory failure with hypoxia: Secondary | ICD-10-CM | POA: Diagnosis not present

## 2019-06-17 DIAGNOSIS — G4733 Obstructive sleep apnea (adult) (pediatric): Secondary | ICD-10-CM | POA: Diagnosis not present

## 2019-06-17 DIAGNOSIS — I509 Heart failure, unspecified: Secondary | ICD-10-CM | POA: Diagnosis not present

## 2019-06-17 DIAGNOSIS — I110 Hypertensive heart disease with heart failure: Secondary | ICD-10-CM | POA: Diagnosis not present

## 2019-06-17 DIAGNOSIS — I272 Pulmonary hypertension, unspecified: Secondary | ICD-10-CM | POA: Diagnosis not present

## 2019-06-19 ENCOUNTER — Telehealth: Payer: Self-pay | Admitting: Family

## 2019-06-19 NOTE — Telephone Encounter (Signed)
Yes please

## 2019-06-19 NOTE — Telephone Encounter (Signed)
Caller/Agency: [place] Number: [phone] Requesting  PT   Frequency: 1 time a week for 1week 2 times a week for 2weeks

## 2019-06-19 NOTE — Telephone Encounter (Signed)
Left voicemail with Darnelle that it is okay for VO , she is off Tuesday-Thursday , Georgena Spurling needs to give her another call back.

## 2019-06-19 NOTE — Telephone Encounter (Signed)
Is it okay to give VO ? 

## 2019-06-20 ENCOUNTER — Other Ambulatory Visit: Payer: Self-pay

## 2019-06-20 DIAGNOSIS — N179 Acute kidney failure, unspecified: Secondary | ICD-10-CM | POA: Diagnosis not present

## 2019-06-20 DIAGNOSIS — R6521 Severe sepsis with septic shock: Secondary | ICD-10-CM | POA: Diagnosis not present

## 2019-06-20 DIAGNOSIS — E785 Hyperlipidemia, unspecified: Secondary | ICD-10-CM | POA: Diagnosis not present

## 2019-06-20 DIAGNOSIS — G4733 Obstructive sleep apnea (adult) (pediatric): Secondary | ICD-10-CM | POA: Diagnosis not present

## 2019-06-20 DIAGNOSIS — I272 Pulmonary hypertension, unspecified: Secondary | ICD-10-CM | POA: Diagnosis not present

## 2019-06-20 DIAGNOSIS — E039 Hypothyroidism, unspecified: Secondary | ICD-10-CM | POA: Diagnosis not present

## 2019-06-20 DIAGNOSIS — N390 Urinary tract infection, site not specified: Secondary | ICD-10-CM | POA: Diagnosis not present

## 2019-06-20 DIAGNOSIS — I509 Heart failure, unspecified: Secondary | ICD-10-CM | POA: Diagnosis not present

## 2019-06-20 DIAGNOSIS — I110 Hypertensive heart disease with heart failure: Secondary | ICD-10-CM | POA: Diagnosis not present

## 2019-06-20 DIAGNOSIS — E1165 Type 2 diabetes mellitus with hyperglycemia: Secondary | ICD-10-CM | POA: Diagnosis not present

## 2019-06-20 DIAGNOSIS — I248 Other forms of acute ischemic heart disease: Secondary | ICD-10-CM | POA: Diagnosis not present

## 2019-06-20 DIAGNOSIS — J9601 Acute respiratory failure with hypoxia: Secondary | ICD-10-CM | POA: Diagnosis not present

## 2019-06-20 NOTE — Telephone Encounter (Signed)
Talked to [place], she received verbal orders.

## 2019-06-21 ENCOUNTER — Ambulatory Visit (INDEPENDENT_AMBULATORY_CARE_PROVIDER_SITE_OTHER): Payer: Medicare Other | Admitting: Endocrinology

## 2019-06-21 ENCOUNTER — Encounter: Payer: Self-pay | Admitting: Endocrinology

## 2019-06-21 VITALS — BP 118/60 | HR 79 | Ht <= 58 in | Wt 127.4 lb

## 2019-06-21 DIAGNOSIS — N179 Acute kidney failure, unspecified: Secondary | ICD-10-CM | POA: Diagnosis not present

## 2019-06-21 DIAGNOSIS — Z794 Long term (current) use of insulin: Secondary | ICD-10-CM | POA: Diagnosis not present

## 2019-06-21 DIAGNOSIS — E039 Hypothyroidism, unspecified: Secondary | ICD-10-CM | POA: Diagnosis not present

## 2019-06-21 DIAGNOSIS — R6521 Severe sepsis with septic shock: Secondary | ICD-10-CM | POA: Diagnosis not present

## 2019-06-21 DIAGNOSIS — N390 Urinary tract infection, site not specified: Secondary | ICD-10-CM

## 2019-06-21 DIAGNOSIS — I110 Hypertensive heart disease with heart failure: Secondary | ICD-10-CM | POA: Diagnosis not present

## 2019-06-21 DIAGNOSIS — E785 Hyperlipidemia, unspecified: Secondary | ICD-10-CM | POA: Diagnosis not present

## 2019-06-21 DIAGNOSIS — J9601 Acute respiratory failure with hypoxia: Secondary | ICD-10-CM | POA: Diagnosis not present

## 2019-06-21 DIAGNOSIS — E1165 Type 2 diabetes mellitus with hyperglycemia: Secondary | ICD-10-CM | POA: Diagnosis not present

## 2019-06-21 DIAGNOSIS — E038 Other specified hypothyroidism: Secondary | ICD-10-CM

## 2019-06-21 DIAGNOSIS — I248 Other forms of acute ischemic heart disease: Secondary | ICD-10-CM | POA: Diagnosis not present

## 2019-06-21 DIAGNOSIS — I272 Pulmonary hypertension, unspecified: Secondary | ICD-10-CM | POA: Diagnosis not present

## 2019-06-21 DIAGNOSIS — G4733 Obstructive sleep apnea (adult) (pediatric): Secondary | ICD-10-CM | POA: Diagnosis not present

## 2019-06-21 DIAGNOSIS — I509 Heart failure, unspecified: Secondary | ICD-10-CM | POA: Diagnosis not present

## 2019-06-21 LAB — URINALYSIS, ROUTINE W REFLEX MICROSCOPIC
Bilirubin Urine: NEGATIVE
Hgb urine dipstick: NEGATIVE
Ketones, ur: NEGATIVE
Nitrite: NEGATIVE
RBC / HPF: NONE SEEN (ref 0–?)
Specific Gravity, Urine: 1.01 (ref 1.000–1.030)
Total Protein, Urine: 30 — AB
Urine Glucose: >=1000 — AB
Urobilinogen, UA: 0.2 (ref 0.0–1.0)
pH: 6 (ref 5.0–8.0)

## 2019-06-21 LAB — COMPREHENSIVE METABOLIC PANEL
ALT: 15 U/L (ref 0–35)
AST: 13 U/L (ref 0–37)
Albumin: 4 g/dL (ref 3.5–5.2)
Alkaline Phosphatase: 92 U/L (ref 39–117)
BUN: 14 mg/dL (ref 6–23)
CO2: 27 mEq/L (ref 19–32)
Calcium: 9.2 mg/dL (ref 8.4–10.5)
Chloride: 96 mEq/L (ref 96–112)
Creatinine, Ser: 0.85 mg/dL (ref 0.40–1.20)
GFR: 63.92 mL/min (ref >60.00)
Glucose, Bld: 354 mg/dL — ABNORMAL HIGH (ref 70–99)
Potassium: 4.1 mEq/L (ref 3.5–5.1)
Sodium: 132 mEq/L — ABNORMAL LOW (ref 135–145)
Total Bilirubin: 0.9 mg/dL (ref 0.2–1.2)
Total Protein: 7.2 g/dL (ref 6.0–8.3)

## 2019-06-21 LAB — MICROALBUMIN / CREATININE URINE RATIO
Creatinine,U: 85.2 mg/dL
Microalb Creat Ratio: 17.3 mg/g (ref 0.0–30.0)
Microalb, Ur: 14.7 mg/dL — ABNORMAL HIGH (ref 0.0–1.9)

## 2019-06-21 LAB — TSH: TSH: 5.17 u[IU]/mL — ABNORMAL HIGH (ref 0.35–4.50)

## 2019-06-21 LAB — T4, FREE: Free T4: 1.05 ng/dL (ref 0.60–1.60)

## 2019-06-21 LAB — GLUCOSE, POCT (MANUAL RESULT ENTRY): POC Glucose: 376 mg/dl — AB (ref 70–99)

## 2019-06-21 NOTE — Progress Notes (Signed)
Patient ID: Mary Flynn, female   DOB: 1934-07-19, 85 y.o.   MRN: [id number]   Reason for Appointment: Diabetes and general follow-up   History of Present Illness   Diagnosis: Type 2 DIABETES MELITUS, date of diagnosis:   1995    Previous history: She has been on various regimens of hypoglycemic drugs over the last several years including Actos, Amaryl and Januvia. Because of cost her Januvia was stopped and Actos was stopped because of potential weight gain She was initially started on Levemir insulin because of fasting hyperglycemia in 2009 and the dose has been adjusted periodically In the last year she is also quite mealtime coverage because of postprandial hyperglycemia despite usually eating a low carbohydrate diet   Recent history:   Insulin regimen: Levemir 20 units hs;  Humalog 10 at breakfast, 10 at lunch and 6  acs  Oral hypoglycemic drugs: Metformin 500 mg twice daily, on hold: Invokana 113m in am  Current blood sugar patterns and problems identified: Her A1c on her hospital admission earlier this month was 8.2, previously 7.4   She has been checking her blood sugars with her new monitor only in the last 3 days and not clear what her blood sugars have been especially prior to her recent hospital admission  As before she checks her blood sugars only in the morning  Compared to her previous visit her morning sugars are recently consistently high  Despite her insulin doses being increased significantly in the hospital her fasting readings appear to be around 200 now  Today after her breakfast of cereal and banana blood sugar is 376 compared to 215 earlier this morning before breakfast  Invokana and Amaryl have been stopped  Her Metformin has been reduced to 500 mg twice daily at the hospital discharge on 4/19, she did have some renal insufficiency initially but last creatinine was back to normal  Her daughter is present in the office today to help with her  management, patient appears to have had some difficulties with her memory  She has not been taking her Invokana at least since her hospital stay, previously also had been given Jardiance which was preferred by her insurance     Side effects from medications: None.                Monitors blood glucose: Once  a day.    Glucometer:  One Touch Verio        Blood Glucose readings from meter download:    PRE-MEAL Fasting Lunch Dinner Bedtime Overall  Glucose range:  86-175      Mean/median:      125     Meals: 3 meals per day she is a vegetarian. Trying to get some protein at most meals including dairy products and tofu  Has breakfast at 10 am,  lunch around 2 PM, dinner at 8 PM           Last visit with dietitian: 2009   Wt Readings from Last 3 Encounters:  06/21/19 127 lb 6.4 oz (57.8 kg)  06/12/19 122 lb 12.7 oz (55.7 kg)  05/24/19 130 lb (59 kg)    LABS:  Lab Results  Component Value Date   HGBA1C 8.2 (H) 05/30/2019   HGBA1C 7.4 (H) 03/15/2019   HGBA1C 7.6 (H) 12/08/2018   Lab Results  Component Value Date   MICROALBUR 3.8 (H) 07/08/2018   LDLCALC 76 06/10/2019   CREATININE 0.87 06/12/2019    Other issues discussed  today: See review of systems   Allergies as of 06/21/2019      Reactions   Ace Inhibitors Cough   Losartan Cough   Penicillins Hives   Sulfa Antibiotics Hives   Sulfonamide Derivatives Hives      Medication List       Accurate as of June 21, 2019 10:55 AM. If you have any questions, ask your nurse or doctor.        albuterol 108 (90 Base) MCG/ACT inhaler Commonly known as: ProAir HFA Inhale 2 puffs into the lungs every 6 (six) hours as needed for wheezing or shortness of breath.   aspirin EC 81 MG tablet Take 81 mg by mouth daily.   atorvastatin 10 MG tablet Commonly known as: LIPITOR TAKE 1 TABLET BY MOUTH  DAILY   b complex vitamins tablet Take 1 tablet by mouth daily.   cyclobenzaprine 10 MG tablet Commonly known as:  FLEXERIL TAKE 1 TABLET BY MOUTH TWO  TIMES DAILY AS NEEDED FOR  MUSCLE SPASM What changed:   how much to take  how to take this  when to take this  reasons to take this  additional instructions   DULoxetine 30 MG capsule Commonly known as: CYMBALTA TAKE 1 CAPSULE BY MOUTH  DAILY   famotidine 20 MG tablet Commonly known as: PEPCID TAKE 1 TABLET BY MOUTH AT  BEDTIME   fluticasone 110 MCG/ACT inhaler Commonly known as: Flovent HFA Inhale 2 puffs into the lungs 2 (two) times daily.   fluticasone 50 MCG/ACT nasal spray Commonly known as: FLONASE Place 2 sprays into both nostrils daily.   furosemide 40 MG tablet Commonly known as: Lasix Take 1 tablet (40 mg total) by mouth daily as needed for fluid (for  weight gain in 24-hour period of > 5 pounds).   gabapentin 100 MG capsule Commonly known as: NEURONTIN Take 1 capsule (100 mg total) by mouth 2 (two) times daily.   glucose blood test strip Use OneTouch Verio Flex test strips as instructed to check blood sugar twice daily.   HAIR SKIN AND NAILS FORMULA PO Take by mouth daily.   HumaLOG KwikPen 100 UNIT/ML KiwkPen Generic drug: insulin lispro 8 units with breakfast and lunch, 4 units at evening meal   insulin detemir 100 UNIT/ML FlexPen Commonly known as: Levemir FlexPen Inject 20 Units into the skin at bedtime.   Insulin Pen Needle 32G X 4 MM Misc Use 3 per day   Pen Needles 32G X 4 MM Misc 1 Dose by Does not apply route as directed.   latanoprost 0.005 % ophthalmic solution Commonly known as: XALATAN Place 1 drop into both eyes at bedtime.   levocetirizine 5 MG tablet Commonly known as: XYZAL TAKE 1 TABLET BY MOUTH  EVERY EVENING   levothyroxine 25 MCG tablet Commonly known as: SYNTHROID TAKE 1 TABLET BY MOUTH  DAILY BEFORE BREAKFAST What changed: See the new instructions.   magnesium oxide 400 (241.3 Mg) MG tablet Commonly known as: MAG-OX Take 2 tablets (800 mg total) by mouth 2 (two) times daily.    melatonin 3 MG Tabs tablet Take 2 tablets (6 mg total) by mouth at bedtime as needed (Sleep).   metFORMIN 500 MG tablet Commonly known as: GLUCOPHAGE Take 1 tablet (500 mg total) by mouth 2 (two) times daily with a meal.   metoprolol tartrate 25 MG tablet Commonly known as: LOPRESSOR Take 1 tablet (25 mg total) by mouth 2 (two) times daily.   montelukast 10 MG tablet Commonly  known as: SINGULAIR Take 1 tablet (10 mg total) by mouth at bedtime.   NONFORMULARY OR COMPOUNDED ITEM Lumbar support belt  #1  As directed   nystatin ointment Commonly known as: MYCOSTATIN APPLY TOPICALLY TWO TIMES  DAILY AS NEEDED   OneTouch Delica Lancets 12X Misc 1 each by Does not apply route 2 (two) times daily. Use OneTouch Delica lancets to check blood sugar twice daily.   OneTouch Verio Flex System w/Device Kit 1 each by Does not apply route 2 (two) times daily. Use Onetouch verio flex to check blood sugar twice daily.   pantoprazole 40 MG tablet Commonly known as: PROTONIX TAKE 1 TABLET BY MOUTH  DAILY   timolol 0.5 % ophthalmic solution Commonly known as: BETIMOL Place 1 drop into both eyes 2 (two) times daily. What changed: when to take this   triamcinolone 0.1 % paste Commonly known as: KENALOG Use as directed 1 application in the mouth or throat 2 (two) times daily.   vitamin E 180 MG (400 UNITS) capsule Take 400 Units by mouth daily.       Allergies:  Allergies  Allergen Reactions  . Ace Inhibitors Cough  . Losartan Cough  . Penicillins Hives  . Sulfa Antibiotics Hives  . Sulfonamide Derivatives Hives    Past Medical History:  Diagnosis Date  . Diabetes mellitus   . Hyperlipidemia   . Hypertension   . Left rib fracture 02/18/2014    Past Surgical History:  Procedure Laterality Date  . APPENDECTOMY    . REFRACTIVE SURGERY     Left Eye  . surgical sterilization  40 years ago    Family History  Problem Relation Age of Onset  . Diabetes Mother   .  Hypertension Father   . Depression Other   . Cancer Other        laryngeal  . Diabetes Other     Social History:  reports that she has never smoked. She has never used smokeless tobacco. She reports that she does not drink alcohol or use drugs.  Review of Systems:   HYPOTHYROIDISM:  She is has been mild and not clear if she had symptoms except mild hair loss Has been started on treatment by PCP   Continues to be on 25 g of levothyroxine  TSH consistently normal  Lab Results  Component Value Date   TSH 1.53 05/24/2019   TSH 2.73 12/08/2018   TSH 2.56 09/23/2018   FREET4 1.17 12/08/2018   FREET4 1.06 06/19/2016   FREET4 1.12 06/10/2016     Hypertension:    Previously controlled with valsartan, for some time only taking metoprolol  BP Readings from Last 3 Encounters:  06/21/19 118/60  06/12/19 127/63  05/24/19 (!) 125/57    Lab Results  Component Value Date   CREATININE 0.87 06/12/2019   BUN 30 (H) 06/12/2019   NA 136 06/12/2019   K 4.3 06/12/2019   CL 96 (L) 06/12/2019   CO2 27 06/12/2019    Lipids: Has had diabetic dyslipidemia, LDL well controlled and followed by her PCP   Lab Results  Component Value Date   CHOL 148 06/10/2019   HDL 32 (L) 06/10/2019   LDLCALC 76 06/10/2019   TRIG 198 (H) 06/10/2019   CHOLHDL 4.6 06/10/2019     Diabetic foot exam done in 11/2018  Diabetic foot exam previously showed normal monofilament sensation in the toes and plantar surfaces, no skin lesions or ulcers on the feet and normal pedal pulses  Last  eye exam documented on 02/26/2017     Examination:   BP 118/60 (BP Location: Left Arm, Patient Position: Sitting, Cuff Size: Normal)   Pulse 79   Ht _0  (1.473 m)   Wt 127 lb 6.4 oz (57.8 kg)   SpO2 96%   BMI 26.63 kg/m   Body mass index is 26.63 kg/m.     ASSESSMENT/ PLAN:   Diabetes type 2 insulin requiring  See history of present illness for detailed discussion of  current management, blood sugar  patterns and problems identified  Her A1c is recently 8.2  With her not taking Invokana and reduced doses of Metformin her blood sugars have been difficult to control Also likely because of acute illness she has had significant hyperglycemia which appears to be persisting now She is taking much larger dose of both basal and bolus insulins However difficult to assess her blood sugar patterns since she only checks sporadic readings in the morning Glucose is 376 today in the office, increasing postprandially despite reportedly taking her Humalog this morning Her meals are likely high in carbohydrate especially breakfast Unlikely that she will need Amaryl currently since she appears to be more insulin deficient   Recommendations:   She will need to modify her diet especially with carbohydrate intake such as in the morning eating cornflakes and fruit  Her daughter is present and she will try to make sure she gets some protein, either with dairy products, peanut butter or tofu with all her meals  Was again given a reminder on checking her blood sugars after meals, she can also check some readings at bedtime when she takes her Levemir  Discussed blood sugar targets  Since she cannot keep up with a flowsheet for her basal insulin she was asked to increase her Levemir by 4 units until morning sugars are below 140 but at the start coming down below 90 she will reduce it down 4 units again  May need to adjust her Humalog based on her readings after meals  In the meantime go back to 1000 mg twice daily of metformin  Restart Invokana or Jardiance which ever she has to help with her control  However may consider holding this temporarily if she has a recurrence of her UTI currently  Continue using the One Touch variable monitor  HYPOTHYROIDISM: We will follow-up on the next visit  Recent UTI: She is asking about malodorous urine recently although is not having any burning or fever, will check  her urinalysis and culture and forward results to PCP   There are no Patient Instructions on file for this visit.      Elayne Snare 06/21/2019, 10:55 AM

## 2019-06-21 NOTE — Patient Instructions (Addendum)
Levemir 24 units until am sugar < 140 and reduce to 20 when < 90  Check blood sugars on waking up     Also check blood sugars about 2 hours after meals 2x daily and do this after different meals by rotation  Recommended blood sugar levels on waking up are 90-130 and about 2 hours after meal is 130-180  Please bring your blood sugar monitor to each visit, thank you  Invokana 100mg  in am  Metformin 1000mg  2x daily

## 2019-06-22 ENCOUNTER — Telehealth: Payer: Self-pay | Admitting: Family

## 2019-06-22 DIAGNOSIS — N390 Urinary tract infection, site not specified: Secondary | ICD-10-CM | POA: Diagnosis not present

## 2019-06-22 DIAGNOSIS — E785 Hyperlipidemia, unspecified: Secondary | ICD-10-CM | POA: Diagnosis not present

## 2019-06-22 DIAGNOSIS — I272 Pulmonary hypertension, unspecified: Secondary | ICD-10-CM | POA: Diagnosis not present

## 2019-06-22 DIAGNOSIS — E039 Hypothyroidism, unspecified: Secondary | ICD-10-CM | POA: Diagnosis not present

## 2019-06-22 DIAGNOSIS — G4733 Obstructive sleep apnea (adult) (pediatric): Secondary | ICD-10-CM | POA: Diagnosis not present

## 2019-06-22 DIAGNOSIS — I248 Other forms of acute ischemic heart disease: Secondary | ICD-10-CM | POA: Diagnosis not present

## 2019-06-22 DIAGNOSIS — R6521 Severe sepsis with septic shock: Secondary | ICD-10-CM | POA: Diagnosis not present

## 2019-06-22 DIAGNOSIS — I110 Hypertensive heart disease with heart failure: Secondary | ICD-10-CM | POA: Diagnosis not present

## 2019-06-22 DIAGNOSIS — E1165 Type 2 diabetes mellitus with hyperglycemia: Secondary | ICD-10-CM | POA: Diagnosis not present

## 2019-06-22 DIAGNOSIS — J9601 Acute respiratory failure with hypoxia: Secondary | ICD-10-CM | POA: Diagnosis not present

## 2019-06-22 DIAGNOSIS — I509 Heart failure, unspecified: Secondary | ICD-10-CM | POA: Diagnosis not present

## 2019-06-22 DIAGNOSIS — N179 Acute kidney failure, unspecified: Secondary | ICD-10-CM | POA: Diagnosis not present

## 2019-06-22 MED ORDER — NITROFURANTOIN MONOHYD MACRO 100 MG PO CAPS: 100.0000 mg | ORAL_CAPSULE | Freq: Two times a day (BID) | ORAL | 0 refills | Status: DC

## 2019-06-22 MED FILL — NITROFURANTOIN MONO-MCR 100: 100 | 7 days supply | Qty: 14 | Fill #0

## 2019-06-22 NOTE — Telephone Encounter (Signed)
Mary Flynn, Please contact pt and daughter and let her know that Dr. Dwyane Dee sent me the results of her urinalysis yesterday.  Urinalysis suggests urinary tract infection again.  I would like her to start macrobid twice daily while we wait on the formal culture results.  Call if weakness, fever, worsening uti symptoms (burning/frequency etc.)

## 2019-06-22 NOTE — Telephone Encounter (Signed)
Patient's daughter advised of results and prescription. She verbalized understanding and that she will pick up today.

## 2019-06-23 ENCOUNTER — Inpatient Hospital Stay: Payer: Medicare Other | Admitting: Family

## 2019-06-23 ENCOUNTER — Ambulatory Visit (INDEPENDENT_AMBULATORY_CARE_PROVIDER_SITE_OTHER): Payer: Medicare Other | Admitting: Internal Medicine

## 2019-06-23 ENCOUNTER — Encounter: Payer: Self-pay | Admitting: Internal Medicine

## 2019-06-23 ENCOUNTER — Other Ambulatory Visit: Payer: Self-pay

## 2019-06-23 VITALS — BP 128/74 | HR 81 | Temp 97.3°F | Resp 18 | Ht <= 58 in | Wt 126.0 lb

## 2019-06-23 DIAGNOSIS — N390 Urinary tract infection, site not specified: Secondary | ICD-10-CM | POA: Diagnosis not present

## 2019-06-23 DIAGNOSIS — R7881 Bacteremia: Secondary | ICD-10-CM | POA: Diagnosis not present

## 2019-06-23 DIAGNOSIS — B9620 Unspecified Escherichia coli [E. coli] as the cause of diseases classified elsewhere: Secondary | ICD-10-CM

## 2019-06-23 DIAGNOSIS — N179 Acute kidney failure, unspecified: Secondary | ICD-10-CM | POA: Diagnosis not present

## 2019-06-23 LAB — URINE CULTURE

## 2019-06-23 NOTE — Patient Instructions (Addendum)
Continue the same medications  The final urine culture will be ready in the next few days, we might need to change your antibiotics  Drink plenty fluids  Consider Ensure  Probiotics is a great idea  Continue checking your oxygen and using oxygen at night   GO TO THE FRONT DESK, PLEASE SCHEDULE YOUR APPOINTMENTS Come back for   a checkup in 4 weeks

## 2019-06-23 NOTE — Progress Notes (Signed)
Subjective:    Patient ID: Mary Flynn, female    DOB: Dec 23, 1934, 85 y.o.   MRN: [id number]  DOS:  06/23/2019 Type of visit - description: Hospital follow-up, transfer of care, TCM, here with her daughter  Admitted to the hospital 05/30/2019, and discharged on 06/12/2019 She was diagnosed with UTI after an outpatient visit 05/24/2019, initially prescribed Cipro, subsequently based on urine culture switch to [place]. Subsequently was admitted to the hospital, was transferred to the ER via EMS with MS changes, ill appearing, hypoxic, tachycardic. Was discharged home after being treated for the following problems:  Septic shock with bacteremia Acute respiratory failure with hypoxia, multifactorial including pulmonary HTN, pulmonary edema.  Was on BiPAP at some point Diarrhea while in the hospital with negative C. difficile testing 06/07/2019  Work-up including chest CT, negative for PE Echocardiogram with hyperdynamic left ventricle.   Review of Systems Since she left the hospital she is better but he still feels weaker than baseline and somewhat demoralized from the recent illness. + Subjective fever but temperature is check and normal. No shortness of breath No cough or wheezing or sputum production Had some nausea, no further diarrhea. Mental status is completely normal No dysuria, gross hematuria difficulty urinating however she continue reporting a peculiar urine smell in the urine, UA was repeated, see assessment and plan.   Past Medical History:  Diagnosis Date  . Diabetes mellitus   . Hyperlipidemia   . Hypertension   . Left rib fracture 02/18/2014    Past Surgical History:  Procedure Laterality Date  . APPENDECTOMY    . REFRACTIVE SURGERY     Left Eye  . surgical sterilization  40 years ago    Allergies as of 06/23/2019      Reactions   Ace Inhibitors Cough   Losartan Cough   Penicillins Hives   Sulfa Antibiotics Hives   Sulfonamide Derivatives Hives        Medication List       Accurate as of June 23, 2019 11:59 PM. If you have any questions, ask your nurse or doctor.        STOP taking these medications   cyclobenzaprine 10 MG tablet Commonly known as: FLEXERIL Stopped by: Kathlene November, MD   fluticasone 50 MCG/ACT nasal spray Commonly known as: FLONASE Stopped by: Kathlene November, MD   glimepiride 4 MG tablet Commonly known as: AMARYL Stopped by: Kathlene November, MD   metoprolol succinate 100 MG 24 hr tablet Commonly known as: TOPROL-XL Stopped by: Kathlene November, MD   triamcinolone 0.1 % paste Commonly known as: KENALOG Stopped by: Kathlene November, MD     TAKE these medications   albuterol 108 (90 Base) MCG/ACT inhaler Commonly known as: ProAir HFA Inhale 2 puffs into the lungs every 6 (six) hours as needed for wheezing or shortness of breath.   aspirin EC 81 MG tablet Take 81 mg by mouth daily.   atorvastatin 10 MG tablet Commonly known as: LIPITOR TAKE 1 TABLET BY MOUTH  DAILY What changed: Another medication with the same name was removed. Continue taking this medication, and follow the directions you see here.   b complex vitamins tablet Take 1 tablet by mouth daily.   DULoxetine 30 MG capsule Commonly known as: CYMBALTA TAKE 1 CAPSULE BY MOUTH  DAILY What changed: Another medication with the same name was removed. Continue taking this medication, and follow the directions you see here.   famotidine 20 MG tablet Commonly known as: PEPCID TAKE  1 TABLET BY MOUTH AT  BEDTIME   fluticasone 110 MCG/ACT inhaler Commonly known as: Flovent HFA Inhale 2 puffs into the lungs 2 (two) times daily.   furosemide 40 MG tablet Commonly known as: Lasix Take 1 tablet (40 mg total) by mouth daily as needed for fluid (for  weight gain in 24-hour period of > 5 pounds).   gabapentin 100 MG capsule Commonly known as: NEURONTIN Take 1 capsule (100 mg total) by mouth 2 (two) times daily.   glucose blood test strip Use OneTouch Verio Flex test  strips as instructed to check blood sugar twice daily.   HAIR SKIN AND NAILS FORMULA PO Take by mouth daily.   HumaLOG KwikPen 100 UNIT/ML KiwkPen Generic drug: insulin lispro 10 units with breakfast and lunch, 6 units at evening meal   insulin detemir 100 UNIT/ML FlexPen Commonly known as: Levemir FlexPen Inject 20 Units into the skin at bedtime. What changed: how much to take   Insulin Pen Needle 32G X 4 MM Misc Use 3 per day   Pen Needles 32G X 4 MM Misc 1 Dose by Does not apply route as directed.   latanoprost 0.005 % ophthalmic solution Commonly known as: XALATAN Place 1 drop into both eyes at bedtime.   levocetirizine 5 MG tablet Commonly known as: XYZAL TAKE 1 TABLET BY MOUTH  EVERY EVENING   levothyroxine 25 MCG tablet Commonly known as: SYNTHROID TAKE 1 TABLET BY MOUTH  DAILY BEFORE BREAKFAST What changed: See the new instructions.   magnesium oxide 400 (241.3 Mg) MG tablet Commonly known as: MAG-OX Take 2 tablets (800 mg total) by mouth 2 (two) times daily.   melatonin 3 MG Tabs tablet Take 2 tablets (6 mg total) by mouth at bedtime as needed (Sleep).   metFORMIN 500 MG tablet Commonly known as: GLUCOPHAGE Take 1 tablet (500 mg total) by mouth 2 (two) times daily with a meal. What changed:   how much to take  Another medication with the same name was removed. Continue taking this medication, and follow the directions you see here.   metoprolol tartrate 25 MG tablet Commonly known as: LOPRESSOR Take 1 tablet (25 mg total) by mouth 2 (two) times daily.   montelukast 10 MG tablet Commonly known as: SINGULAIR Take 1 tablet (10 mg total) by mouth at bedtime. What changed: Another medication with the same name was removed. Continue taking this medication, and follow the directions you see here. Changed by: Kathlene November, MD   nitrofurantoin (macrocrystal-monohydrate) 100 MG capsule Commonly known as: Macrobid Take 1 capsule (100 mg total) by mouth 2 (two)  times daily.   NONFORMULARY OR COMPOUNDED ITEM Lumbar support belt  #1  As directed   nystatin ointment Commonly known as: MYCOSTATIN APPLY TOPICALLY TWO TIMES  DAILY AS NEEDED   OneTouch Delica Lancets 06T Misc 1 each by Does not apply route 2 (two) times daily. Use OneTouch Delica lancets to check blood sugar twice daily.   OneTouch Verio Flex System w/Device Kit 1 each by Does not apply route 2 (two) times daily. Use Onetouch verio flex to check blood sugar twice daily.   OXYGEN Inhale 2 L into the lungs at bedtime.   pantoprazole 40 MG tablet Commonly known as: PROTONIX TAKE 1 TABLET BY MOUTH  DAILY What changed: Another medication with the same name was removed. Continue taking this medication, and follow the directions you see here.   timolol 0.5 % ophthalmic solution Commonly known as: BETIMOL Place 1 drop  into both eyes 2 (two) times daily. What changed: when to take this   vitamin E 180 MG (400 UNITS) capsule Take 400 Units by mouth daily.          Objective:   Physical Exam BP 128/74 (BP Location: Right Arm, Patient Position: Sitting, Cuff Size: Small)   Pulse 81   Temp (!) 97.3 F (36.3 C) (Temporal)   Resp 18   Ht '4\' 10"'$  (1.473 m)   Wt 126 lb (57.2 kg)   SpO2 94%   BMI 26.33 kg/m  General:   Well developed, NAD, BMI noted.  HEENT:  Normocephalic . Face symmetric, atraumatic Lungs:  CTA B Normal respiratory effort, no intercostal retractions, no accessory muscle use. Heart: RRR,  no murmur.  Abdomen:  Not distended, soft, mild TTP at the lower abdomen, exam was performed while the patient was sitting. Skin: Not pale. Not jaundice Lower extremities: no pretibial edema bilaterally  Neurologic:  alert & oriented X3.  Speech normal, gait appropriate for age, assisted by a walker Psych--  Cognition and judgment appear intact.  Cooperative with normal attention span and concentration.  Behavior appropriate. No anxious or depressed appearing.      Assessment    Assessment DM with peripheral neuropathy, Dr. Dwyane Dee HTN DOE (has seen pulmonary, cardiology) Hyperlipidemia Hypothyroidism Chronic bronchitis H/o compression fracture L2  PLAN: Admitted with septic shock, ESBL E. coli  bacteremia, acute respiratory failure with hypoxia, demand ischemia due to sepsis, AKI: Status post meropenem x7 days for bacteremia. Urinalysis was repeated by endocrinology, it did show infection, she started Macrobid yesterday, urine culture pending but she is already growing gram negative rods.  Further advised with full results. Had diarrhea during the admission, C. difficile negative, resolved, she still has some occasional nausea and lower abdominal discomfort.  To start probiotics today. Post admission labs reviewed and stable or normal. OSA?  Witnessed apneic events with desaturation to the low 80s while sleeping noted in the hospital, needs to see pulmonology. Currently on night time O2  To see pulmonary 07/06/19 CHF?:  Per discharge summary, patient had CHF however that is not documented elsewhere. She was prescribed edema as needed for swelling, has only needed it once, no edema on exam today. Anorexia: Noted since she was discharged from the hospital, unclear etiology, encourage fluids and Ensure. Social: Lives with her husband, her daughter is visiting from [place] and is considering taking them to [place] in few weeks RTC 4 weeks This visit occurred during the SARS-CoV-2 public health emergency.  Safety protocols were in place, including screening questions prior to the visit, additional usage of staff PPE, and extensive cleaning of exam room while observing appropriate contact time as indicated for disinfecting solutions.

## 2019-06-24 ENCOUNTER — Telehealth: Payer: Self-pay | Admitting: Internal Medicine

## 2019-06-24 NOTE — Telephone Encounter (Signed)
Recently admitted to the hospital with ESBL E. coli bacteremia, s/p meropenem and x7 days.  Postadmission UCX   from 06/13/2019 continue showing ESBL E. coli. Patient was seen yesterday, doing well, she is already on Macrobid x7 days. Other option would be fosfomycin but that only treat bladder infections. Situation discussed with ID on call, basically we need to continue with Macrobid for now, if she starts having other symptoms such as fever chills, will have to advise go to the hospital for IV antibiotics.  Situation discussed with the patient over the phone as above. To avoid any misunderstandings I will call again and talk with the daughter.  I will also suggest recheck a UA urine culture 2 weeks from today.  Urology referral?

## 2019-06-24 NOTE — Telephone Encounter (Signed)
I spoke with Mary Flynn and also another family member who is an ID specialist (Nalini [phone]). Were all on agreement  Mary Flynn Please send 3 additional days of Macrobid 100 mg twice daily (#6, no refills) Arrange for a UA, urine culture 2 weeks from today.

## 2019-06-26 ENCOUNTER — Inpatient Hospital Stay: Payer: Medicare Other | Admitting: Family

## 2019-06-26 DIAGNOSIS — I248 Other forms of acute ischemic heart disease: Secondary | ICD-10-CM | POA: Diagnosis not present

## 2019-06-26 DIAGNOSIS — E039 Hypothyroidism, unspecified: Secondary | ICD-10-CM | POA: Diagnosis not present

## 2019-06-26 DIAGNOSIS — E785 Hyperlipidemia, unspecified: Secondary | ICD-10-CM | POA: Diagnosis not present

## 2019-06-26 DIAGNOSIS — R6521 Severe sepsis with septic shock: Secondary | ICD-10-CM | POA: Diagnosis not present

## 2019-06-26 DIAGNOSIS — N390 Urinary tract infection, site not specified: Secondary | ICD-10-CM | POA: Diagnosis not present

## 2019-06-26 DIAGNOSIS — I110 Hypertensive heart disease with heart failure: Secondary | ICD-10-CM | POA: Diagnosis not present

## 2019-06-26 DIAGNOSIS — E1165 Type 2 diabetes mellitus with hyperglycemia: Secondary | ICD-10-CM | POA: Diagnosis not present

## 2019-06-26 DIAGNOSIS — G4733 Obstructive sleep apnea (adult) (pediatric): Secondary | ICD-10-CM | POA: Diagnosis not present

## 2019-06-26 DIAGNOSIS — I509 Heart failure, unspecified: Secondary | ICD-10-CM | POA: Diagnosis not present

## 2019-06-26 DIAGNOSIS — J9601 Acute respiratory failure with hypoxia: Secondary | ICD-10-CM | POA: Diagnosis not present

## 2019-06-26 DIAGNOSIS — I272 Pulmonary hypertension, unspecified: Secondary | ICD-10-CM | POA: Diagnosis not present

## 2019-06-26 DIAGNOSIS — N179 Acute kidney failure, unspecified: Secondary | ICD-10-CM | POA: Diagnosis not present

## 2019-06-26 MED ORDER — NITROFURANTOIN MONOHYD MACRO 100 MG PO CAPS: 100.0000 mg | ORAL_CAPSULE | Freq: Two times a day (BID) | ORAL | 0 refills | Status: DC

## 2019-06-26 NOTE — Telephone Encounter (Signed)
Rx sent. Pt has appt scheduled w/ PCP on 07/12/2019.

## 2019-06-27 MED FILL — NITROFURANTOIN MONO-MCR 100: 100 | 3 days supply | Qty: 6 | Fill #0

## 2019-06-29 ENCOUNTER — Telehealth: Payer: Self-pay | Admitting: Internal Medicine

## 2019-06-29 DIAGNOSIS — I248 Other forms of acute ischemic heart disease: Secondary | ICD-10-CM | POA: Diagnosis not present

## 2019-06-29 DIAGNOSIS — E039 Hypothyroidism, unspecified: Secondary | ICD-10-CM | POA: Diagnosis not present

## 2019-06-29 DIAGNOSIS — R6521 Severe sepsis with septic shock: Secondary | ICD-10-CM | POA: Diagnosis not present

## 2019-06-29 DIAGNOSIS — N390 Urinary tract infection, site not specified: Secondary | ICD-10-CM | POA: Diagnosis not present

## 2019-06-29 DIAGNOSIS — E1165 Type 2 diabetes mellitus with hyperglycemia: Secondary | ICD-10-CM | POA: Diagnosis not present

## 2019-06-29 DIAGNOSIS — I110 Hypertensive heart disease with heart failure: Secondary | ICD-10-CM | POA: Diagnosis not present

## 2019-06-29 DIAGNOSIS — N179 Acute kidney failure, unspecified: Secondary | ICD-10-CM | POA: Diagnosis not present

## 2019-06-29 DIAGNOSIS — J9601 Acute respiratory failure with hypoxia: Secondary | ICD-10-CM | POA: Diagnosis not present

## 2019-06-29 DIAGNOSIS — E785 Hyperlipidemia, unspecified: Secondary | ICD-10-CM | POA: Diagnosis not present

## 2019-06-29 DIAGNOSIS — I272 Pulmonary hypertension, unspecified: Secondary | ICD-10-CM | POA: Diagnosis not present

## 2019-06-29 DIAGNOSIS — I509 Heart failure, unspecified: Secondary | ICD-10-CM | POA: Diagnosis not present

## 2019-06-29 DIAGNOSIS — G4733 Obstructive sleep apnea (adult) (pediatric): Secondary | ICD-10-CM | POA: Diagnosis not present

## 2019-06-29 NOTE — Telephone Encounter (Signed)
Caller : [place]  Call Back # [phone]  Requesting PT extension  3 Times  a Week 2 weeks  2 Times a Week for 1week  Patient is planning to travel at the end of May with daughter to [place].

## 2019-06-29 NOTE — Telephone Encounter (Signed)
LMOM w/ Claiborne Billings- verbal orders given.

## 2019-06-30 DIAGNOSIS — N3000 Acute cystitis without hematuria: Secondary | ICD-10-CM | POA: Diagnosis not present

## 2019-06-30 MED FILL — NITROFURANTOIN MCR 100 MG C: 100 | 5 days supply | Qty: 10 | Fill #0

## 2019-07-03 DIAGNOSIS — E785 Hyperlipidemia, unspecified: Secondary | ICD-10-CM | POA: Diagnosis not present

## 2019-07-03 DIAGNOSIS — G4733 Obstructive sleep apnea (adult) (pediatric): Secondary | ICD-10-CM | POA: Diagnosis not present

## 2019-07-03 DIAGNOSIS — I110 Hypertensive heart disease with heart failure: Secondary | ICD-10-CM | POA: Diagnosis not present

## 2019-07-03 DIAGNOSIS — E039 Hypothyroidism, unspecified: Secondary | ICD-10-CM | POA: Diagnosis not present

## 2019-07-03 DIAGNOSIS — J9601 Acute respiratory failure with hypoxia: Secondary | ICD-10-CM | POA: Diagnosis not present

## 2019-07-03 DIAGNOSIS — I248 Other forms of acute ischemic heart disease: Secondary | ICD-10-CM | POA: Diagnosis not present

## 2019-07-03 DIAGNOSIS — N390 Urinary tract infection, site not specified: Secondary | ICD-10-CM | POA: Diagnosis not present

## 2019-07-03 DIAGNOSIS — I272 Pulmonary hypertension, unspecified: Secondary | ICD-10-CM | POA: Diagnosis not present

## 2019-07-03 DIAGNOSIS — R6521 Severe sepsis with septic shock: Secondary | ICD-10-CM | POA: Diagnosis not present

## 2019-07-03 DIAGNOSIS — N179 Acute kidney failure, unspecified: Secondary | ICD-10-CM | POA: Diagnosis not present

## 2019-07-03 DIAGNOSIS — I509 Heart failure, unspecified: Secondary | ICD-10-CM | POA: Diagnosis not present

## 2019-07-03 DIAGNOSIS — E1165 Type 2 diabetes mellitus with hyperglycemia: Secondary | ICD-10-CM | POA: Diagnosis not present

## 2019-07-04 DIAGNOSIS — I248 Other forms of acute ischemic heart disease: Secondary | ICD-10-CM | POA: Diagnosis not present

## 2019-07-04 DIAGNOSIS — G4733 Obstructive sleep apnea (adult) (pediatric): Secondary | ICD-10-CM | POA: Diagnosis not present

## 2019-07-04 DIAGNOSIS — I110 Hypertensive heart disease with heart failure: Secondary | ICD-10-CM | POA: Diagnosis not present

## 2019-07-04 DIAGNOSIS — E1165 Type 2 diabetes mellitus with hyperglycemia: Secondary | ICD-10-CM | POA: Diagnosis not present

## 2019-07-04 DIAGNOSIS — J9601 Acute respiratory failure with hypoxia: Secondary | ICD-10-CM | POA: Diagnosis not present

## 2019-07-04 DIAGNOSIS — E039 Hypothyroidism, unspecified: Secondary | ICD-10-CM | POA: Diagnosis not present

## 2019-07-04 DIAGNOSIS — I272 Pulmonary hypertension, unspecified: Secondary | ICD-10-CM | POA: Diagnosis not present

## 2019-07-04 DIAGNOSIS — I509 Heart failure, unspecified: Secondary | ICD-10-CM | POA: Diagnosis not present

## 2019-07-04 DIAGNOSIS — R6521 Severe sepsis with septic shock: Secondary | ICD-10-CM | POA: Diagnosis not present

## 2019-07-04 DIAGNOSIS — N179 Acute kidney failure, unspecified: Secondary | ICD-10-CM | POA: Diagnosis not present

## 2019-07-04 DIAGNOSIS — E785 Hyperlipidemia, unspecified: Secondary | ICD-10-CM | POA: Diagnosis not present

## 2019-07-04 DIAGNOSIS — N390 Urinary tract infection, site not specified: Secondary | ICD-10-CM | POA: Diagnosis not present

## 2019-07-05 DIAGNOSIS — I509 Heart failure, unspecified: Secondary | ICD-10-CM | POA: Diagnosis not present

## 2019-07-05 DIAGNOSIS — R6521 Severe sepsis with septic shock: Secondary | ICD-10-CM | POA: Diagnosis not present

## 2019-07-05 DIAGNOSIS — G4733 Obstructive sleep apnea (adult) (pediatric): Secondary | ICD-10-CM | POA: Diagnosis not present

## 2019-07-05 DIAGNOSIS — E039 Hypothyroidism, unspecified: Secondary | ICD-10-CM | POA: Diagnosis not present

## 2019-07-05 DIAGNOSIS — N179 Acute kidney failure, unspecified: Secondary | ICD-10-CM | POA: Diagnosis not present

## 2019-07-05 DIAGNOSIS — I110 Hypertensive heart disease with heart failure: Secondary | ICD-10-CM | POA: Diagnosis not present

## 2019-07-05 DIAGNOSIS — J9601 Acute respiratory failure with hypoxia: Secondary | ICD-10-CM | POA: Diagnosis not present

## 2019-07-05 DIAGNOSIS — E785 Hyperlipidemia, unspecified: Secondary | ICD-10-CM | POA: Diagnosis not present

## 2019-07-05 DIAGNOSIS — I272 Pulmonary hypertension, unspecified: Secondary | ICD-10-CM | POA: Diagnosis not present

## 2019-07-05 DIAGNOSIS — E1165 Type 2 diabetes mellitus with hyperglycemia: Secondary | ICD-10-CM | POA: Diagnosis not present

## 2019-07-05 DIAGNOSIS — N390 Urinary tract infection, site not specified: Secondary | ICD-10-CM | POA: Diagnosis not present

## 2019-07-05 DIAGNOSIS — I248 Other forms of acute ischemic heart disease: Secondary | ICD-10-CM | POA: Diagnosis not present

## 2019-07-06 ENCOUNTER — Ambulatory Visit: Payer: Medicare Other | Admitting: Internal Medicine

## 2019-07-06 ENCOUNTER — Encounter: Payer: Self-pay | Admitting: Internal Medicine

## 2019-07-06 ENCOUNTER — Telehealth: Payer: Self-pay | Admitting: Internal Medicine

## 2019-07-06 ENCOUNTER — Other Ambulatory Visit: Payer: Self-pay

## 2019-07-06 VITALS — BP 122/72 | HR 96 | Temp 98.4°F | Ht <= 58 in | Wt 126.0 lb

## 2019-07-06 DIAGNOSIS — G4734 Idiopathic sleep related nonobstructive alveolar hypoventilation: Secondary | ICD-10-CM

## 2019-07-06 MED ORDER — GABAPENTIN 100 MG PO CAPS: 100.0000 mg | ORAL_CAPSULE | Freq: Four times a day (QID) | ORAL | 11 refills | Status: DC

## 2019-07-06 NOTE — Patient Instructions (Addendum)
Gabapentin 100 mg four times a day   Call if cough not improving to adjust this over the phone   Make sure you check your oxygen saturations at highest level of activity to be sure it stays over 90%    Follow up is as needed

## 2019-07-06 NOTE — Progress Notes (Signed)
Subjective:     Patient ID: Mary Flynn, female   DOB: December 15, 1934 MRN: [id number]    Brief patient profile:  85 yo [place] female never smoker who reports lifelong globus sensation with dysphagia and no other  childhood resp problems including pregancy then around 2001 began a pattern of recurrent cyclical cough  pattern lasting from sev  months up to a year and then resolves s specific rx or need for maint rx  up to 4-5 months self referred to pulmonary clinic 01/11/2013 for re-eval of recurrent persistent cough - Workup including allergy testing,barium swallow, 24hour pH probe nonrevealing previously     History of Present Illness  01/11/2013 1st [facility] office visit/ Itzayanna Kaster in EMR era with recurrent cough Chief Complaint  Patient presents with  . Acute Visit    Pt c/o increased cough x 2 wks- prod with large amounts of clear sputum.  She states that she coughs until she gags and vomits.   cough tends to be worse with any thing in her mouth and with talking.  Actual mucus production is min and "nothing ever works" to include abx, prednisone, inhalers, gerd rx. eval at [place] with conclusion "this isn't reflux" Already eval 01/02/13 by Dr Larose Kells rx with zpack with sorethroat that has resolved  >>Stop cozar (losartan), stop calcium carbonate, and the fosfamax, Benicar 20 mg one daily  Protonix 40 mg Take 30- 60 min before your first and last meals of the day  Neurontin (gabapentin) 100 mg take 2 with every meal Chlortrimeton 4 mg at bedtime     11/11/2017  Pulmonary  consulation / Melvyn Novas recurrent cough since 08/2017 / confused with med names Chief Complaint  Patient presents with  . Pulmonary Consult    Referred by Dr Earlie Counts. Pt c/o increased cough x 2-3 months. She states eating and coughing can trigger the cough.  She is coughing up white sputum. Cough occ wakes her up in the night. She also c/o watery eyes and runny nose.    after flew to [place] summer 2019 > recurrent  cough p two weeks into the trip "felt like a cold but it's gone" Gradually worse since with use of voice Bed Is flat  Taking ppi pc and cough drops  Unless coughing > Not limited by breathing from desired activities  No better on singulair / flonase/xyzal and only taking gabapentin twice daily  rec The key to effective treatment for your cough is eliminating the non-stop cycle of cough you're stuck in long enough to let your airway heal completely and then see if there is anything still making you cough once you stop the cough suppression, but this should take no more than 5 days to figure out First take delsym two tsp every 12 hours and supplement if needed with  tramadol 50 mg up to 1  every 4 hours to suppress the urge to cough at all or even clear your throat. Swallowing water or using ice chips/non mint and menthol containing candies (such as lifesavers or sugarless jolly ranchers) are also effective.  You should rest your voice and avoid activities that you know make you cough. Once you have eliminated the cough for 3 straight days try reducing the tramadol first,  then the delsym as tolerated.  Increase gabapentin to 300 mg three times a day    12/28/2017 acute extended ov/Jazzlin Clements re: cough since 08/2017  Chief Complaint  Patient presents with  . Acute Visit    Cough has been  worse over the past wk.  She is coughing up white sputum.  She has noticed that talking can trigger the cough.  She has had some choking when she eats.   never really 100% better since 08/2017 with day > noct min prod cough assoc with eating and speaking then abruptly much worse x one week but same overall pattern   No sob unless coughing Sleeping flat ok rec For drainage / throat tickle try take CHLORPHENIRAMINE  4 mg - take one every 4 hours as needed - available over the counter- may cause drowsiness so start with just a bedtime dose or two a  Prednisone 10 mg take  4 each am x 2 days,   2 each am x 2 days,  1 each am  x 2 days and stop  Take delsym two tsp every 12 hours and supplement if needed with  Tylenol #3  up to 1 every 4 hours Increase gabapentin to 300 mg four times daily  Follow the calendar as written  Please schedule a follow up office visit in 2 months , call sooner if needed with all medications /inhalers/ solutions in hand      06/27/2018  f/u ov/Kaitrin Seybold re: cough summer 2019 maint on xyazol, singulair, gabapentin 300 tid  Chief Complaint  Patient presents with  . Follow-up    Cough is much improved.   Dyspnea:  Mailbox and back sob slt incline x 77f Cough: still waking up 2-3 x per night and sips water or candy seem to help and goes back bed  Sleeping: no bed blocks  SABA use: none help 02: none Has changed hx once again to suggest that she does improve short term with pred/tylenol #3 short term only but never has relief from sense of ST/globus   rec Bedblocks are 6 - 8 inches at [employer] or [employer] or mattress stores Take chlorpheniramine 4 mg x 2 at bedtime and up to 1 every 4 hours during the day as needed Prednisone 10 mg take  4 each am x 2 days,   2 each am x 2 days,  1 each am x 2 days and stop Please see patient coordinator before you leave today  [facility] a [facility] visit in 4 weeks, call sooner if needed with all medications /inhalers/ solutions in hand so we can verify exactly what you are taking    08/08/2018  f/u ov/Stamatia Masri re:  uacs / globus still present  rx by pcp with flovent/ saba but says never started either one but again very sketchy re details of meds/ says brought all the ones she's using  Chief Complaint  Patient presents with  . Follow-up    Using inhaler approx. 2x a week. Feels like she has improved over all and cough is gone.Not sleeping well, and legs have started swelling.  Dyspnea:  No longer walking to mailbox due to doe  Cough: gone  Sleeping: fine with bed blocks SABA use: none 02: none   rec Reduce amlodipine to 10 mg one half daily to see if helps your swelling  Stop Vitamin  E to see what difference it makes  Keep appt with Dr WJoya Gaskinsfor your sensation of throat fullness since childhood  Please remember to go to the lab and x-ray department   for your tests - we will call you with the results when they are available.  Please schedule a follow up office visit  in 2 weeks , call sooner if needed with all medications /inhalers/ solutions >>>  300 mg 4 x daily  >  Cough resolved to her satisfaction  Admit with urosepsis  DOA: 05/30/2019 DOC:  06/12/19   P  Septic shock positiveESBL bacteremia -Completed 7-day course of Meropenem   Acute respiratory failure with hypoxia -Multifactorial pulmonary hypertension, pulmonary edema. -Treat underlying conditions. -Titrate to maintain SPO2> 94% -Patient laying in bed for a nap saturation down to 86% on room air -Patient meets criteria for home O2. -2 L O2 via [place] titrate to maintain SPO2>94%  OSA -Patient has never been officially evaluated however witnessed apneic events, and desaturates to low 80s when sleeping. -Schedule appointment with Dr. Legrand [facility] for 1 to 2 weeks post discharge. Spirometry, DLCO. Sleep study required  CHF/pulmonary HTN -Dry weight 132 pounds (59.9 kg)perDr. Sunny Schlein Revankarnote from 05/10/2019 -Echocardiogram 4/7 showed hyperdynamic EF with moderate pulmonary HTN see results below. -Strict in and out -1.0 L -Daily weight      Filed Weights   06/10/19 0328 06/11/19 0411 06/12/19 0430  Weight: 55.6 kg 56 kg 55.7 kg  -4/19 new dry weight 122.7 pounds (55.7 kg) -Metoprolol 25 mg BID -40 mg TID PRN more than 5 pounds weight gain in a day.  Tachycardia NOS -See CHF -Resolved   Demand ischemia secondary to severe sepsis -See echocardiogram below -Resolved  Acute kidney injury -Prior to admission normal creatinine -Continue to monitor creatinine level with  diuresis Last Labs          Recent Labs  Lab 06/08/19 0221 06/09/19 0146 06/10/19 0400 06/11/19 0230 06/12/19 7342  CREATININE 0.80 0.80 0.97 0.94 0.87     C. difficile colitis -Patient now having episodes of diarrhea,  -4/14 C. difficile screen negative. C. difficile RULED OUT  DM type II uncontrolled with complication -8/[id number] A1c= 8.2 - Levemir 20 units qhs -Humalog 8 units breakfast and lunch/4 units evening meal -4/17 Metformin 500 mg BID. Will titrate up as patient tolerates -Schedule appointment in 2 to 3 weeks with NP  Melissa,O'Sullivan,CHF/pulmonary hypertension, DM type II uncontrolled with complication (make medication adjustments).  Hypothyroid -Synthroid 25 mcg daily -Recheck TSH in 4-6 weeks - GI prophylaxis -Protonix 40 mg daily  Prior cervicalgia On discharge discontinueamitriptyline 12.5, Flexeril 10--on beers criteria  HLD -4/17 LDL= 76 -DC Niaspan 1000 CR -Lipitor 10 mg daily   Discharge Diagnoses:  Active Problems:   Septic shock (HCC)   AKI (acute kidney injury) ([place])   Severe sepsis with septic shock ([place])   Bacteremia due to Escherichia coli   Pulmonary hypertension ([place])   Demand ischemia (HCC)   Diabetes mellitus type 2, uncontrolled, with complications ([place])       07/06/2019  Post hosp f/u ov/Jaquan Sadowsky re:  S/p covid vaccination / on lopressor 100 mg bid  Chief Complaint  Patient presents with  . Hospitalization Follow-up    doing much better, dry cough, on 2L of 02 at night   Dyspnea: room to room runs out of energy  Legs weak/ sats ok during PT per daughter but doesn't do well walking with mask Cough dry gradually worsening off gabapentin Sleeping: now flat/   Not using bed blocks  SABA use: rare   02: 2 lpm hs only  Last macrobid 5 d prior to OV     No obvious day to day or daytime variability or assoc excess/ purulent sputum or mucus plugs or hemoptysis or cp or chest tightness, subjective wheeze or overt  sinus or hb symptoms.   Sleeping  without nocturnal  or early am exacerbation  of respiratory  c/o's or need for noct saba. Also denies any obvious fluctuation of symptoms with weather or environmental changes or other aggravating or alleviating factors except as outlined above   No unusual exposure hx or h/o childhood pna/ asthma or knowledge of premature birth.  Current Allergies, Complete Past Medical History, Past Surgical History, Family History, and Social History were reviewed in [employer] record.  ROS  The following are not active complaints unless bolded Hoarseness, sore throat, dysphagia, dental problems, itching, sneezing,  nasal congestion or discharge of excess mucus or purulent secretions, ear ache,   fever, chills, sweats, unintended wt loss or wt gain, classically pleuritic or exertional cp,  orthopnea pnd or arm/hand swelling  or leg swelling, presyncope, palpitations, abdominal pain, anorexia, nausea, vomiting, diarrhea  or change in bowel habits or change in bladder habits, change in stools or change in urine, dysuria, hematuria,  rash, arthralgias, visual complaints, headache, numbness, weakness or ataxia or problems with walking or coordination,  change in mood or  memory.        Current Meds  Medication Sig  . albuterol (PROAIR HFA) 108 (90 Base) MCG/ACT inhaler Inhale 2 puffs into the lungs every 6 (six) hours as needed for wheezing or shortness of breath.  Marland Kitchen aspirin EC 81 MG tablet Take 81 mg by mouth daily.  Marland Kitchen atorvastatin (LIPITOR) 10 MG tablet TAKE 1 TABLET BY MOUTH  DAILY (Patient taking differently: Take 10 mg by mouth daily. )  . b complex vitamins tablet Take 1 tablet by mouth daily.  . Blood Glucose Monitoring Suppl (ONETOUCH VERIO FLEX SYSTEM) w/Device KIT 1 each by Does not apply route 2 (two) times daily. Use Onetouch verio flex to check blood sugar twice daily.  . DULoxetine (CYMBALTA) 30 MG capsule TAKE 1 CAPSULE BY MOUTH  DAILY  (Patient taking differently: Take 30 mg by mouth daily. )  . famotidine (PEPCID) 20 MG tablet TAKE 1 TABLET BY MOUTH AT  BEDTIME (Patient taking differently: Take 20 mg by mouth at bedtime. )  . fluticasone (FLOVENT HFA) 110 MCG/ACT inhaler I not using    . furosemide (LASIX) 40 MG tablet Take 1 tablet (40 mg total) by mouth daily as needed for fluid (for  weight gain in 24-hour period of > 5 pounds).  . gabapentin (NEURONTIN) 100 MG capsule Take 1 capsule (100 mg total) by mouth 2 (two) times daily.  Marland Kitchen glucose blood test strip Use OneTouch Verio Flex test strips as instructed to check blood sugar twice daily.  . insulin detemir (LEVEMIR FLEXPEN) 100 UNIT/ML FlexPen Inject 20 Units into the skin at bedtime. (Patient taking differently: Inject 24 Units into the skin at bedtime. )  . insulin lispro (HUMALOG KWIKPEN) 100 UNIT/ML KiwkPen 10 units with breakfast and lunch, 6 units at evening meal  . Insulin Pen Needle (PEN NEEDLES) 32G X 4 MM MISC 1 Dose by Does not apply route as directed.  . Insulin Pen Needle 32G X 4 MM MISC Use 3 per day  . latanoprost (XALATAN) 0.005 % ophthalmic solution Place 1 drop into both eyes at bedtime.  Marland Kitchen levocetirizine (XYZAL) 5 MG tablet TAKE 1 TABLET BY MOUTH  EVERY EVENING (Patient taking differently: Take 5 mg by mouth every evening. )  . levothyroxine (SYNTHROID) 25 MCG tablet TAKE 1 TABLET BY MOUTH  DAILY BEFORE BREAKFAST (Patient taking differently: Take 25 mcg by  mouth daily before breakfast. )  . magnesium oxide (MAG-OX) 400 (241.3 Mg) MG tablet Take 2 tablets (800 mg total) by mouth 2 (two) times daily.  . melatonin 3 MG TABS tablet Take 2 tablets (6 mg total) by mouth at bedtime as needed (Sleep).  . metFORMIN (GLUCOPHAGE) 500 MG tablet Take 1 tablet (500 mg total) by mouth 2 (two) times daily with a meal. (Patient taking differently: Take 1,000 mg by mouth 2 (two) times daily with a meal. )  . metoprolol tartrate (LOPRESSOR) 25 MG tablet Take 1 tablet (25 mg  total) by mouth 2 (two) times daily.  . montelukast (SINGULAIR) 10 MG tablet Take 1 tablet (10 mg total) by mouth at bedtime.  . Multiple Vitamins-Minerals (HAIR SKIN AND NAILS FORMULA PO) Take by mouth daily.  . nitrofurantoin, macrocrystal-monohydrate, (MACROBID) 100 MG capsule  finished   . NONFORMULARY OR COMPOUNDED ITEM Lumbar support belt  #1  As directed  . nystatin ointment (MYCOSTATIN) APPLY TOPICALLY TWO TIMES  DAILY AS NEEDED  . OneTouch Delica Lancets 30Z MISC 1 each by Does not apply route 2 (two) times daily. Use OneTouch Delica lancets to check blood sugar twice daily.  . OXYGEN Inhale 2 L into the lungs at bedtime.  . pantoprazole (PROTONIX) 40 MG tablet TAKE 1 TABLET BY MOUTH  DAILY (Patient taking differently: Take 40 mg by mouth daily. )  . timolol (BETIMOL) 0.5 % ophthalmic solution Place 1 drop into both eyes 2 (two) times daily. (Patient taking differently: Place 1 drop into both eyes daily. )  . vitamin E 400 UNIT capsule Take 400 Units by mouth daily.              Objective:   Physical Exam   amb indian female   08/08/2018  134 08/08/2018  1343/12/2013      136 > 11/11/2017   > 11/11/2017     138 >  12/28/2017   139 > 04/20/2018   141 > 06/27/2018  135 >     04/14/13 136 lb (61.689 kg)  03/28/13 137 lb (62.143 kg)  03/24/13 138 lb (62.596 kg)                 1+ Pitting both lower ext        I personally reviewed images and agree with radiology impression as follows:  pCXR:    06/09/19 No active disease.      Assessment:

## 2019-07-06 NOTE — Telephone Encounter (Signed)
I will send the order to [facility]. I spoke with daughter and nothing further is needed.

## 2019-07-07 ENCOUNTER — Ambulatory Visit: Payer: Medicare Other | Admitting: Endocrinology

## 2019-07-07 DIAGNOSIS — I110 Hypertensive heart disease with heart failure: Secondary | ICD-10-CM | POA: Diagnosis not present

## 2019-07-07 DIAGNOSIS — J9601 Acute respiratory failure with hypoxia: Secondary | ICD-10-CM | POA: Diagnosis not present

## 2019-07-07 DIAGNOSIS — G4733 Obstructive sleep apnea (adult) (pediatric): Secondary | ICD-10-CM | POA: Diagnosis not present

## 2019-07-07 DIAGNOSIS — R6521 Severe sepsis with septic shock: Secondary | ICD-10-CM | POA: Diagnosis not present

## 2019-07-07 DIAGNOSIS — E785 Hyperlipidemia, unspecified: Secondary | ICD-10-CM | POA: Diagnosis not present

## 2019-07-07 DIAGNOSIS — E1165 Type 2 diabetes mellitus with hyperglycemia: Secondary | ICD-10-CM | POA: Diagnosis not present

## 2019-07-07 DIAGNOSIS — N390 Urinary tract infection, site not specified: Secondary | ICD-10-CM | POA: Diagnosis not present

## 2019-07-07 DIAGNOSIS — I509 Heart failure, unspecified: Secondary | ICD-10-CM | POA: Diagnosis not present

## 2019-07-07 DIAGNOSIS — N179 Acute kidney failure, unspecified: Secondary | ICD-10-CM | POA: Diagnosis not present

## 2019-07-07 DIAGNOSIS — E039 Hypothyroidism, unspecified: Secondary | ICD-10-CM | POA: Diagnosis not present

## 2019-07-07 DIAGNOSIS — I272 Pulmonary hypertension, unspecified: Secondary | ICD-10-CM | POA: Diagnosis not present

## 2019-07-07 DIAGNOSIS — I248 Other forms of acute ischemic heart disease: Secondary | ICD-10-CM | POA: Diagnosis not present

## 2019-07-10 DIAGNOSIS — N179 Acute kidney failure, unspecified: Secondary | ICD-10-CM | POA: Diagnosis not present

## 2019-07-10 DIAGNOSIS — E1165 Type 2 diabetes mellitus with hyperglycemia: Secondary | ICD-10-CM | POA: Diagnosis not present

## 2019-07-10 DIAGNOSIS — E039 Hypothyroidism, unspecified: Secondary | ICD-10-CM | POA: Diagnosis not present

## 2019-07-10 DIAGNOSIS — J9601 Acute respiratory failure with hypoxia: Secondary | ICD-10-CM | POA: Diagnosis not present

## 2019-07-10 DIAGNOSIS — I248 Other forms of acute ischemic heart disease: Secondary | ICD-10-CM | POA: Diagnosis not present

## 2019-07-10 DIAGNOSIS — I110 Hypertensive heart disease with heart failure: Secondary | ICD-10-CM | POA: Diagnosis not present

## 2019-07-10 DIAGNOSIS — E785 Hyperlipidemia, unspecified: Secondary | ICD-10-CM | POA: Diagnosis not present

## 2019-07-10 DIAGNOSIS — N390 Urinary tract infection, site not specified: Secondary | ICD-10-CM | POA: Diagnosis not present

## 2019-07-10 DIAGNOSIS — G4733 Obstructive sleep apnea (adult) (pediatric): Secondary | ICD-10-CM | POA: Diagnosis not present

## 2019-07-10 DIAGNOSIS — I509 Heart failure, unspecified: Secondary | ICD-10-CM | POA: Diagnosis not present

## 2019-07-10 DIAGNOSIS — R6521 Severe sepsis with septic shock: Secondary | ICD-10-CM | POA: Diagnosis not present

## 2019-07-10 DIAGNOSIS — I272 Pulmonary hypertension, unspecified: Secondary | ICD-10-CM | POA: Diagnosis not present

## 2019-07-10 DIAGNOSIS — N3000 Acute cystitis without hematuria: Secondary | ICD-10-CM | POA: Diagnosis not present

## 2019-07-11 ENCOUNTER — Telehealth: Payer: Self-pay

## 2019-07-11 NOTE — Telephone Encounter (Signed)
PT Re-eval received from [facility]- form signed and faxed back to [phone]. Form sent for scanning.

## 2019-07-11 NOTE — Telephone Encounter (Signed)
O2 CMN order signed and faxed back to [facility] at [phone]. Form sent for scanning.

## 2019-07-12 ENCOUNTER — Ambulatory Visit (INDEPENDENT_AMBULATORY_CARE_PROVIDER_SITE_OTHER): Payer: Medicare Other | Admitting: Internal Medicine

## 2019-07-12 ENCOUNTER — Telehealth: Payer: Self-pay | Admitting: Internal Medicine

## 2019-07-12 ENCOUNTER — Encounter: Payer: Self-pay | Admitting: Internal Medicine

## 2019-07-12 ENCOUNTER — Other Ambulatory Visit: Payer: Self-pay

## 2019-07-12 VITALS — BP 146/70 | HR 93 | Temp 97.4°F | Resp 18 | Ht <= 58 in | Wt 121.5 lb

## 2019-07-12 DIAGNOSIS — J9601 Acute respiratory failure with hypoxia: Secondary | ICD-10-CM | POA: Diagnosis not present

## 2019-07-12 DIAGNOSIS — I110 Hypertensive heart disease with heart failure: Secondary | ICD-10-CM | POA: Diagnosis not present

## 2019-07-12 DIAGNOSIS — R630 Anorexia: Secondary | ICD-10-CM

## 2019-07-12 DIAGNOSIS — R1013 Epigastric pain: Secondary | ICD-10-CM | POA: Diagnosis not present

## 2019-07-12 DIAGNOSIS — R6521 Severe sepsis with septic shock: Secondary | ICD-10-CM | POA: Diagnosis not present

## 2019-07-12 DIAGNOSIS — E039 Hypothyroidism, unspecified: Secondary | ICD-10-CM | POA: Diagnosis not present

## 2019-07-12 DIAGNOSIS — E1165 Type 2 diabetes mellitus with hyperglycemia: Secondary | ICD-10-CM | POA: Diagnosis not present

## 2019-07-12 DIAGNOSIS — M25561 Pain in right knee: Secondary | ICD-10-CM | POA: Diagnosis not present

## 2019-07-12 DIAGNOSIS — R0902 Hypoxemia: Secondary | ICD-10-CM

## 2019-07-12 DIAGNOSIS — I272 Pulmonary hypertension, unspecified: Secondary | ICD-10-CM | POA: Diagnosis not present

## 2019-07-12 DIAGNOSIS — N390 Urinary tract infection, site not specified: Secondary | ICD-10-CM | POA: Diagnosis not present

## 2019-07-12 DIAGNOSIS — I509 Heart failure, unspecified: Secondary | ICD-10-CM | POA: Diagnosis not present

## 2019-07-12 DIAGNOSIS — E785 Hyperlipidemia, unspecified: Secondary | ICD-10-CM | POA: Diagnosis not present

## 2019-07-12 DIAGNOSIS — G4733 Obstructive sleep apnea (adult) (pediatric): Secondary | ICD-10-CM | POA: Diagnosis not present

## 2019-07-12 DIAGNOSIS — I248 Other forms of acute ischemic heart disease: Secondary | ICD-10-CM | POA: Diagnosis not present

## 2019-07-12 DIAGNOSIS — N179 Acute kidney failure, unspecified: Secondary | ICD-10-CM | POA: Diagnosis not present

## 2019-07-12 NOTE — Telephone Encounter (Signed)
CallerJoseph Art , [place]  Call Back # [phone]  Per Renee , patient's fasting blood sugar was 230 this morning.

## 2019-07-12 NOTE — Telephone Encounter (Signed)
Received call from [place] at [facility]- informed that form was faxed back yesterday morning to number listed on form-she states that was not the correct number. Informed I'd have to wait until form is uploaded to chart which can take time. Lindell Noe verbalized understanding.

## 2019-07-12 NOTE — Progress Notes (Signed)
Pre visit review using our clinic review tool, if applicable. No additional management support is needed unless otherwise documented below in the visit note. 

## 2019-07-12 NOTE — Telephone Encounter (Signed)
FYI. Pt has appt at 1:40pm today.

## 2019-07-12 NOTE — Patient Instructions (Addendum)
Per our records you are due for an eye exam. Please contact your eye doctor to schedule an appointment. Please have them send copies of your office visit notes to [place]. Our fax number is [phone].  For knee pain: Ice the area twice a day Tylenol 1 or 2 tablets 3 times a day Call if the area gets red warm.  Return the stool test  GO TO THE LAB : Get the blood work     [place], [place] back for a checkup in 4 weeks

## 2019-07-12 NOTE — Progress Notes (Signed)
Subjective:    Patient ID: Mary Flynn, female    DOB: 1934/06/02, 85 y.o.   MRN: [id number]  DOS:  07/12/2019 Type of visit - description: Here with her daughter Since the last office visit, overall she is getting stronger. Still has poor appetite. Has no taste for food Reports nausea typically prior to her meals. After the meals she has generalized abdominal pain, more noticeable at the epigastric area. Occasionally after a meal has a BM and that relieves the pain. Denies vomiting, diarrhea or blood in the stools.  Also for 2 weeks right knee has been swollen and slightly painful. Has not been red or hot.  Wt Readings from Last 3 Encounters:  07/12/19 121 lb 8 oz (55.1 kg)  07/06/19 126 lb (57.2 kg)  06/23/19 126 lb (57.2 kg)      Review of Systems Denies dysuria, hematuria.  Past Medical History:  Diagnosis Date  . Diabetes mellitus   . Hyperlipidemia   . Hypertension   . Left rib fracture 02/18/2014    Past Surgical History:  Procedure Laterality Date  . APPENDECTOMY    . REFRACTIVE SURGERY     Left Eye  . surgical sterilization  40 years ago    Allergies as of 07/12/2019      Reactions   Ace Inhibitors Cough   Losartan Cough   Penicillins Hives   Sulfa Antibiotics Hives   Sulfonamide Derivatives Hives      Medication List       Accurate as of Jul 12, 2019 11:59 PM. If you have any questions, ask your nurse or doctor.        STOP taking these medications   magnesium oxide 400 (241.3 Mg) MG tablet Commonly known as: MAG-OX Stopped by: Kathlene November, MD     TAKE these medications   albuterol 108 (90 Base) MCG/ACT inhaler Commonly known as: ProAir HFA Inhale 2 puffs into the lungs every 6 (six) hours as needed for wheezing or shortness of breath.   aspirin EC 81 MG tablet Take 81 mg by mouth daily.   atorvastatin 10 MG tablet Commonly known as: LIPITOR TAKE 1 TABLET BY MOUTH  DAILY   b complex vitamins tablet Take 1 tablet by mouth daily.     DULoxetine 30 MG capsule Commonly known as: CYMBALTA TAKE 1 CAPSULE BY MOUTH  DAILY   famotidine 20 MG tablet Commonly known as: PEPCID TAKE 1 TABLET BY MOUTH AT  BEDTIME   furosemide 40 MG tablet Commonly known as: Lasix Take 1 tablet (40 mg total) by mouth daily as needed for fluid (for  weight gain in 24-hour period of > 5 pounds).   gabapentin 100 MG capsule Commonly known as: NEURONTIN Take 1 capsule (100 mg total) by mouth 4 (four) times daily.   glucose blood test strip Use OneTouch Verio Flex test strips as instructed to check blood sugar twice daily.   HAIR SKIN AND NAILS FORMULA PO Take by mouth daily.   HumaLOG KwikPen 100 UNIT/ML KiwkPen Generic drug: insulin lispro 10 units with breakfast and lunch, 6 units at evening meal   insulin detemir 100 UNIT/ML FlexPen Commonly known as: Levemir FlexPen Inject 20 Units into the skin at bedtime. What changed: how much to take   Insulin Pen Needle 32G X 4 MM Misc Use 3 per day   Pen Needles 32G X 4 MM Misc 1 Dose by Does not apply route as directed.   latanoprost 0.005 % ophthalmic solution Commonly  known as: XALATAN Place 1 drop into both eyes at bedtime.   levocetirizine 5 MG tablet Commonly known as: XYZAL TAKE 1 TABLET BY MOUTH  EVERY EVENING   levothyroxine 25 MCG tablet Commonly known as: SYNTHROID TAKE 1 TABLET BY MOUTH  DAILY BEFORE BREAKFAST What changed: See the new instructions.   melatonin 3 MG Tabs tablet Take 2 tablets (6 mg total) by mouth at bedtime as needed (Sleep).   metFORMIN 500 MG tablet Commonly known as: GLUCOPHAGE Take 1 tablet (500 mg total) by mouth 2 (two) times daily with a meal. What changed: how much to take   metoprolol tartrate 25 MG tablet Commonly known as: LOPRESSOR Take 1 tablet (25 mg total) by mouth 2 (two) times daily.   montelukast 10 MG tablet Commonly known as: SINGULAIR Take 1 tablet (10 mg total) by mouth at bedtime.   nitrofurantoin  (macrocrystal-monohydrate) 100 MG capsule Commonly known as: Macrobid Take 1 capsule (100 mg total) by mouth 2 (two) times daily.   NONFORMULARY OR COMPOUNDED ITEM Lumbar support belt  #1  As directed   nystatin ointment Commonly known as: MYCOSTATIN APPLY TOPICALLY TWO TIMES  DAILY AS NEEDED   OneTouch Delica Lancets 35W Misc 1 each by Does not apply route 2 (two) times daily. Use OneTouch Delica lancets to check blood sugar twice daily.   OneTouch Verio Flex System w/Device Kit 1 each by Does not apply route 2 (two) times daily. Use Onetouch verio flex to check blood sugar twice daily.   OXYGEN Inhale 2 L into the lungs at bedtime.   pantoprazole 40 MG tablet Commonly known as: PROTONIX TAKE 1 TABLET BY MOUTH  DAILY   timolol 0.5 % ophthalmic solution Commonly known as: BETIMOL Place 1 drop into both eyes 2 (two) times daily. What changed: when to take this   vitamin E 180 MG (400 UNITS) capsule Take 400 Units by mouth daily.          Objective:   Physical Exam BP (!) 146/70 (BP Location: Left Arm, Patient Position: Sitting, Cuff Size: Normal)   Pulse 93   Temp (!) 97.4 F (36.3 C) (Temporal)   Resp 18   Ht '4\' 10"'$  (1.473 m)   Wt 121 lb 8 oz (55.1 kg)   SpO2 93%   BMI 25.39 kg/m  General:   Well developed, NAD, BMI noted.  HEENT:  Normocephalic . Face symmetric, atraumatic Lungs:  CTA B Normal respiratory effort, no intercostal retractions, no accessory muscle use. Heart: RRR,  no murmur.  Abdomen:  Not distended, soft, minimal tenderness at the epigastric area without mass or rebound Skin: Not pale. Not jaundice Lower extremities: Knees symmetric, bony changes consistent with DJD, no effusion, warmness or redness Neurologic:  alert & oriented X3.  Speech normal, gait assisted by a cane. Psych--  Cognition and judgment appear intact.  Cooperative with normal attention span and concentration.  Behavior appropriate. No anxious or depressed  appearing.     Assessment     Assessment DM with peripheral neuropathy, Dr. Dwyane Dee HTN DOE (has seen pulmonary, cardiology) Hyperlipidemia Hypothyroidism Chronic bronchitis H/o compression fracture L2  PLAN: Acute respiratory failure with hypoxia: Saw pulmonary 07/06/2019, see note; was told to cont O2 qhs , was rec to increase gabapentin to QID for treatment of cough which has help. OSA? Was rec to continue w/ O2 qhs per patient's daughter.  ESBL E. coli bacteremia: Since the last office visit, I discussed the treatment of ESBL E. coli UTI  treatment with ID, no many options except continue Macrobid for now and if she has fever chills she will need to go to the hospital for IV antibiotics. Saw urology 07/10/2019, they noted that the urinalysis continues to show pyuria, culture sent, I do not have the results.  She was rec nitrofurantoin self start if needed.  PVR was reassuring. DM: Blood sugars elevated, actually this problem is managed by Dr. Dwyane Dee. Right knee pain: Likely DJD, recommend ice/Tylenol/wrapping.  If she ever has swollen red/warm knee she needs to be seen. Abdominal pain: Postprandial abdominal pain mostly at the epigastrium, this is going on for a while, she is on PPIs and H2 blockers. Plan: Check a CBC and a  I FOB  >>>   If any evidence of GI bleed will refer to GI. Anorexia: Not improved, reassess in 4 weeks, mild weight loss noted. Magnesium supplements: Was prescribed Mg at the hospital, magnesium levels were minimally low at first , then  normal. Okay to stop Mg for now . RTC 4 weeks   This visit occurred during the SARS-CoV-2 public health emergency.  Safety protocols were in place, including screening questions prior to the visit, additional usage of staff PPE, and extensive cleaning of exam room while observing appropriate contact time as indicated for disinfecting solutions.

## 2019-07-12 NOTE — Telephone Encounter (Signed)
Will address today

## 2019-07-13 ENCOUNTER — Other Ambulatory Visit: Payer: Self-pay | Admitting: Family

## 2019-07-13 DIAGNOSIS — Z09 Encounter for follow-up examination after completed treatment for conditions other than malignant neoplasm: Secondary | ICD-10-CM | POA: Insufficient documentation

## 2019-07-13 DIAGNOSIS — E785 Hyperlipidemia, unspecified: Secondary | ICD-10-CM

## 2019-07-13 DIAGNOSIS — K219 Gastro-esophageal reflux disease without esophagitis: Secondary | ICD-10-CM

## 2019-07-13 LAB — CBC WITH DIFFERENTIAL/PLATELET
Basophils Absolute: 0.1 10*3/uL (ref 0.0–0.1)
Basophils Relative: 0.6 % (ref 0.0–3.0)
Eosinophils Absolute: 0.2 10*3/uL (ref 0.0–0.7)
Eosinophils Relative: 2.5 % (ref 0.0–5.0)
HCT: 34.3 % — ABNORMAL LOW (ref 36.0–46.0)
Hemoglobin: 11.1 g/dL — ABNORMAL LOW (ref 12.0–15.0)
Lymphocytes Relative: 19.5 % (ref 12.0–46.0)
Lymphs Abs: 1.9 10*3/uL (ref 0.7–4.0)
MCHC: 32.4 g/dL (ref 30.0–36.0)
MCV: 76.2 fl — ABNORMAL LOW (ref 78.0–100.0)
Monocytes Absolute: 1.1 10*3/uL — ABNORMAL HIGH (ref 0.1–1.0)
Monocytes Relative: 10.9 % (ref 3.0–12.0)
Neutro Abs: 6.5 10*3/uL (ref 1.4–7.7)
Neutrophils Relative %: 66.5 % (ref 43.0–77.0)
Platelets: 446 10*3/uL — ABNORMAL HIGH (ref 150.0–400.0)
RBC: 4.5 Mil/uL (ref 3.87–5.11)
RDW: 21.6 % — ABNORMAL HIGH (ref 11.5–15.5)
WBC: 9.7 10*3/uL (ref 4.0–10.5)

## 2019-07-13 NOTE — Assessment & Plan Note (Addendum)
Acute respiratory failure with hypoxia: Saw pulmonary 07/06/2019, see note; was told to cont O2 qhs , was rec to increase gabapentin to QID for treatment of cough which has help. OSA? Was rec to continue w/ O2 qhs per patient's daughter. ESBL E. coli bacteremia: Since the last office visit, I discussed the treatment of ESBL E. coli UTI treatment with ID, no many options except continue Macrobid for now and if she has fever chills she will need to go to the hospital for IV antibiotics. Saw urology 07/10/2019, they noted that the urinalysis continues to show pyuria, culture sent, I do not have the results.  She was rec nitrofurantoin self start if needed.  PVR was reassuring. DM: Blood sugars elevated, actually this problem is managed by Dr. Dwyane Dee. Right knee pain: Likely DJD, recommend ice/Tylenol/wrapping.  If she ever has swollen red/warm knee she needs to be seen. Abdominal pain: Postprandial abdominal pain mostly at the epigastrium, this is going on for a while, she is on PPIs and H2 blockers. Plan: Check a CBC and a  I FOB  >>>   If any evidence of GI bleed will refer to GI. Anorexia: Not improved, reassess in 4 weeks, mild weight loss noted. Magnesium supplements: Was prescribed Mg at the hospital, magnesium levels were minimally low at first , then  normal. Okay to stop Mg for now . RTC 4 weeks

## 2019-07-14 DIAGNOSIS — N3000 Acute cystitis without hematuria: Secondary | ICD-10-CM | POA: Diagnosis not present

## 2019-07-14 DIAGNOSIS — I509 Heart failure, unspecified: Secondary | ICD-10-CM | POA: Diagnosis not present

## 2019-07-14 DIAGNOSIS — E039 Hypothyroidism, unspecified: Secondary | ICD-10-CM | POA: Diagnosis not present

## 2019-07-14 DIAGNOSIS — I110 Hypertensive heart disease with heart failure: Secondary | ICD-10-CM | POA: Diagnosis not present

## 2019-07-14 DIAGNOSIS — G4733 Obstructive sleep apnea (adult) (pediatric): Secondary | ICD-10-CM | POA: Diagnosis not present

## 2019-07-14 DIAGNOSIS — E1165 Type 2 diabetes mellitus with hyperglycemia: Secondary | ICD-10-CM | POA: Diagnosis not present

## 2019-07-14 DIAGNOSIS — N390 Urinary tract infection, site not specified: Secondary | ICD-10-CM | POA: Diagnosis not present

## 2019-07-14 DIAGNOSIS — R6521 Severe sepsis with septic shock: Secondary | ICD-10-CM | POA: Diagnosis not present

## 2019-07-14 DIAGNOSIS — I272 Pulmonary hypertension, unspecified: Secondary | ICD-10-CM | POA: Diagnosis not present

## 2019-07-14 DIAGNOSIS — J9601 Acute respiratory failure with hypoxia: Secondary | ICD-10-CM | POA: Diagnosis not present

## 2019-07-14 DIAGNOSIS — N179 Acute kidney failure, unspecified: Secondary | ICD-10-CM | POA: Diagnosis not present

## 2019-07-14 DIAGNOSIS — I248 Other forms of acute ischemic heart disease: Secondary | ICD-10-CM | POA: Diagnosis not present

## 2019-07-14 DIAGNOSIS — E785 Hyperlipidemia, unspecified: Secondary | ICD-10-CM | POA: Diagnosis not present

## 2019-07-14 NOTE — Telephone Encounter (Signed)
Received fax confirmation

## 2019-07-14 NOTE — Telephone Encounter (Signed)
CMN order scanned to chart. Will re-fax to [facility] at [phone].

## 2019-07-17 ENCOUNTER — Ambulatory Visit: Payer: Medicare Other | Admitting: Endocrinology

## 2019-07-17 ENCOUNTER — Other Ambulatory Visit: Payer: Self-pay

## 2019-07-17 ENCOUNTER — Encounter: Payer: Self-pay | Admitting: Endocrinology

## 2019-07-17 VITALS — BP 170/84 | HR 98 | Ht <= 58 in | Wt 122.0 lb

## 2019-07-17 DIAGNOSIS — I248 Other forms of acute ischemic heart disease: Secondary | ICD-10-CM | POA: Diagnosis not present

## 2019-07-17 DIAGNOSIS — Z794 Long term (current) use of insulin: Secondary | ICD-10-CM

## 2019-07-17 DIAGNOSIS — N179 Acute kidney failure, unspecified: Secondary | ICD-10-CM | POA: Diagnosis not present

## 2019-07-17 DIAGNOSIS — I10 Essential (primary) hypertension: Secondary | ICD-10-CM | POA: Diagnosis not present

## 2019-07-17 DIAGNOSIS — J9601 Acute respiratory failure with hypoxia: Secondary | ICD-10-CM | POA: Diagnosis not present

## 2019-07-17 DIAGNOSIS — E039 Hypothyroidism, unspecified: Secondary | ICD-10-CM | POA: Diagnosis not present

## 2019-07-17 DIAGNOSIS — I110 Hypertensive heart disease with heart failure: Secondary | ICD-10-CM | POA: Diagnosis not present

## 2019-07-17 DIAGNOSIS — E1165 Type 2 diabetes mellitus with hyperglycemia: Secondary | ICD-10-CM | POA: Diagnosis not present

## 2019-07-17 DIAGNOSIS — N390 Urinary tract infection, site not specified: Secondary | ICD-10-CM | POA: Diagnosis not present

## 2019-07-17 DIAGNOSIS — I272 Pulmonary hypertension, unspecified: Secondary | ICD-10-CM | POA: Diagnosis not present

## 2019-07-17 DIAGNOSIS — G4733 Obstructive sleep apnea (adult) (pediatric): Secondary | ICD-10-CM | POA: Diagnosis not present

## 2019-07-17 DIAGNOSIS — R6521 Severe sepsis with septic shock: Secondary | ICD-10-CM | POA: Diagnosis not present

## 2019-07-17 DIAGNOSIS — E785 Hyperlipidemia, unspecified: Secondary | ICD-10-CM | POA: Diagnosis not present

## 2019-07-17 DIAGNOSIS — I509 Heart failure, unspecified: Secondary | ICD-10-CM | POA: Diagnosis not present

## 2019-07-17 LAB — POCT GLUCOSE (DEVICE FOR HOME USE): POC Glucose: 194 mg/dl — AB (ref 70–99)

## 2019-07-17 NOTE — Patient Instructions (Signed)
Levemir 22 units.   You can take this at the same time as your Humalog at dinnertime  May reduce the dose down to 20 if the morning sugars are getting below 100    Check blood sugars on waking up daily and preferably before every meal and bedtime  as much you can    HUMALOG: Continue 10 units before breakfast, take only 8 units before lunch and 4 units before dinner.  Try to take this before starting to eat   Recommended blood sugar levels on waking up are 90-130 and about 2 hours after meal is 130-180   Stop taking JARDIANCE for now and only started if the urine infection is not present   Continue Metformin 1000mg  2x daily   THYROID: Increase the dose to 2 tablets of the 25 mcg and will also give you a prescription for 1 tablet of the 50 mcg to take before breakfast daily

## 2019-07-17 NOTE — Progress Notes (Signed)
Patient ID: Mary Flynn, female   DOB: 12-05-1934, 85 y.o.   MRN: [id number]   Reason for Appointment: Diabetes and general follow-up   History of Present Illness   Diagnosis: Type 2 DIABETES MELITUS, date of diagnosis:   1995    Previous history: She has been on various regimens of hypoglycemic drugs over the last several years including Actos, Amaryl and Januvia. Because of cost her Januvia was stopped and Actos was stopped because of potential weight gain She was initially started on Levemir insulin because of fasting hyperglycemia in 2009 and the dose has been adjusted periodically In the last year she is also quite mealtime coverage because of postprandial hyperglycemia despite usually eating a low carbohydrate diet   Recent history:   Insulin regimen: Levemir 24 units hs;  Humalog 10 at breakfast, 10-12 at lunch and 7-8  acs  Oral hypoglycemic drugs: Metformin 1000 mg twice daily,   Current blood sugar patterns and problems identified: Her A1c on her hospital admission earlier this month was 8.2, previously 7.4   She has been checking her blood sugars more often overall but in the last few days only in the morning again  She apparently forgot the instructions given on the insulin increase on her last visit until a few days ago when blood sugars were still running over 200 most of the time  However in the last few days since 5/20 her morning sugars are mostly 120-140 range  She did have a low blood sugar of 61 Friday afternoon possibly from taking 12 units of Humalog instead of 10  No other hypoglycemia  However not clear recently however her postprandial readings are; today with eating toast and cheese her postprandial reading today is 194; she thinks she took her Humalog 10 units this morning  She thinks that occasionally she may forget her Levemir at bedtime  Has gone back to Metformin 1 g twice daily  She was told to hold off on Jardiance until her UTI is  cleared up but she is still taking it  She thinks her appetite is overall better although not clear why she has lost weight     Side effects from medications: None.                Monitors blood glucose:  Usually once  a day.    Glucometer:  One Touch Verio        Blood Glucose readings from meter download:    PRE-MEAL Fasting Lunch Dinner Bedtime Overall  Glucose range:  100-276  173, 281  60  215-268   Mean/median:  170    190   Previous readings:  PRE-MEAL Fasting Lunch Dinner Bedtime Overall  Glucose range:  86-175      Mean/median:      125     Meals: 3 meals per day she is a vegetarian. Trying to get some protein at most meals including dairy products and tofu  Has breakfast at 10 am,  lunch around 2 PM, dinner at 8 PM           Last visit with dietitian: 2009   Wt Readings from Last 3 Encounters:  07/17/19 122 lb (55.3 kg)  07/12/19 121 lb 8 oz (55.1 kg)  07/06/19 126 lb (57.2 kg)    LABS:  Lab Results  Component Value Date   HGBA1C 8.2 (H) 05/30/2019   HGBA1C 7.4 (H) 03/15/2019   HGBA1C 7.6 (H) 12/08/2018   Lab Results  Component Value Date   MICROALBUR 14.7 (H) 06/21/2019   LDLCALC 76 06/10/2019   CREATININE 0.85 06/21/2019    Other issues discussed today: See review of systems   Allergies as of 07/17/2019      Reactions   Ace Inhibitors Cough   Losartan Cough   Penicillins Hives   Sulfa Antibiotics Hives   Sulfonamide Derivatives Hives      Medication List       Accurate as of Jul 17, 2019  1:05 PM. If you have any questions, ask your nurse or doctor.        STOP taking these medications   glimepiride 4 MG tablet Commonly known as: AMARYL Stopped by: Elayne Snare, MD   levocetirizine 5 MG tablet Commonly known as: XYZAL Stopped by: Elayne Snare, MD   metoprolol tartrate 25 MG tablet Commonly known as: LOPRESSOR Stopped by: Elayne Snare, MD   nitrofurantoin (macrocrystal-monohydrate) 100 MG capsule Commonly known as:  Macrobid Stopped by: Elayne Snare, MD     TAKE these medications   albuterol 108 (90 Base) MCG/ACT inhaler Commonly known as: ProAir HFA Inhale 2 puffs into the lungs every 6 (six) hours as needed for wheezing or shortness of breath.   aspirin EC 81 MG tablet Take 81 mg by mouth daily.   atorvastatin 10 MG tablet Commonly known as: LIPITOR TAKE 1 TABLET BY MOUTH  DAILY   b complex vitamins tablet Take 1 tablet by mouth daily.   DULoxetine 30 MG capsule Commonly known as: CYMBALTA TAKE 1 CAPSULE BY MOUTH  DAILY   famotidine 20 MG tablet Commonly known as: PEPCID TAKE 1 TABLET BY MOUTH AT  BEDTIME   furosemide 40 MG tablet Commonly known as: Lasix Take 1 tablet (40 mg total) by mouth daily as needed for fluid (for  weight gain in 24-hour period of > 5 pounds).   gabapentin 100 MG capsule Commonly known as: NEURONTIN Take 1 capsule (100 mg total) by mouth 4 (four) times daily.   glucose blood test strip Use OneTouch Verio Flex test strips as instructed to check blood sugar twice daily.   HAIR SKIN AND NAILS FORMULA PO Take by mouth daily.   Womens 50+ Multi Vitamin/Min Tabs Take 1 tablet by mouth daily.   HumaLOG KwikPen 100 UNIT/ML KiwkPen Generic drug: insulin lispro 10 units with breakfast and lunch, 6 units at evening meal   insulin detemir 100 UNIT/ML FlexPen Commonly known as: Levemir FlexPen Inject 20 Units into the skin at bedtime. What changed: how much to take   Insulin Pen Needle 32G X 4 MM Misc Use 3 per day   Pen Needles 32G X 4 MM Misc 1 Dose by Does not apply route as directed.   latanoprost 0.005 % ophthalmic solution Commonly known as: XALATAN Place 1 drop into both eyes at bedtime.   levothyroxine 25 MCG tablet Commonly known as: SYNTHROID TAKE 1 TABLET BY MOUTH  DAILY BEFORE BREAKFAST What changed: See the new instructions.   melatonin 3 MG Tabs tablet Take 2 tablets (6 mg total) by mouth at bedtime as needed (Sleep).   metFORMIN  1000 MG tablet Commonly known as: GLUCOPHAGE TAKE 1 TABLET BY MOUTH  TWICE DAILY WITH A MEAL What changed: Another medication with the same name was removed. Continue taking this medication, and follow the directions you see here. Changed by: Elayne Snare, MD   metoprolol succinate 100 MG 24 hr tablet Commonly known as: TOPROL-XL TAKE 1 TABLET BY MOUTH  DAILY WITH  OR IMMEDIATELY  FOLLOWING A MEAL   montelukast 10 MG tablet Commonly known as: SINGULAIR Take 1 tablet (10 mg total) by mouth at bedtime.   NONFORMULARY OR COMPOUNDED ITEM Lumbar support belt  #1  As directed   nystatin ointment Commonly known as: MYCOSTATIN APPLY TOPICALLY TWO TIMES  DAILY AS NEEDED   OneTouch Delica Lancets 74J Misc 1 each by Does not apply route 2 (two) times daily. Use OneTouch Delica lancets to check blood sugar twice daily.   OneTouch Verio Flex System w/Device Kit 1 each by Does not apply route 2 (two) times daily. Use Onetouch verio flex to check blood sugar twice daily.   OXYGEN Inhale 2 L into the lungs at bedtime.   pantoprazole 40 MG tablet Commonly known as: PROTONIX TAKE 1 TABLET BY MOUTH  DAILY   PROBIOTIC DAILY PO Take 1 tablet by mouth daily.   timolol 0.5 % ophthalmic solution Commonly known as: BETIMOL Place 1 drop into both eyes 2 (two) times daily. What changed: when to take this   Vitamin C 500 MG Caps Take 1 tablet by mouth daily.   vitamin E 180 MG (400 UNITS) capsule Take 400 Units by mouth daily.       Allergies:  Allergies  Allergen Reactions  . Ace Inhibitors Cough  . Losartan Cough  . Penicillins Hives  . Sulfa Antibiotics Hives  . Sulfonamide Derivatives Hives    Past Medical History:  Diagnosis Date  . Diabetes mellitus   . Hyperlipidemia   . Hypertension   . Left rib fracture 02/18/2014    Past Surgical History:  Procedure Laterality Date  . APPENDECTOMY    . REFRACTIVE SURGERY     Left Eye  . surgical sterilization  40 years ago     Family History  Problem Relation Age of Onset  . Diabetes Mother   . Hypertension Father   . Depression Other   . Cancer Other        laryngeal  . Diabetes Other     Social History:  reports that she has never smoked. She has never used smokeless tobacco. She reports that she does not drink alcohol or use drugs.  Review of Systems:   HYPOTHYROIDISM:  This previously has been mild and not clear if she had baseline symptoms except mild hair loss Had been started on 25 mcg levothyroxine by PCP  Recent thyroid levels show high TSH level although she thinks she has been taking her tablets daily before breakfast  She has also been feeling more tired and sleepy  Lab Results  Component Value Date   TSH 5.17 (H) 06/21/2019   TSH 1.53 05/24/2019   TSH 2.73 12/08/2018   FREET4 1.05 06/21/2019   FREET4 1.17 12/08/2018   FREET4 1.06 06/19/2016    Hypertension:    Previously treated with valsartan, now only taking metoprolol However apparently she has not taken this for 2 days and blood pressure and pulse is higher today  BP Readings from Last 3 Encounters:  07/17/19 (!) 170/84  07/12/19 (!) 146/70  07/06/19 122/72    Lab Results  Component Value Date   CREATININE 0.85 06/21/2019   BUN 14 06/21/2019   NA 132 (L) 06/21/2019   K 4.1 06/21/2019   CL 96 06/21/2019   CO2 27 06/21/2019    Lipids: Has had diabetic dyslipidemia, LDL well controlled and followed by her PCP   Lab Results  Component Value Date   CHOL 148 06/10/2019  HDL 32 (L) 06/10/2019   LDLCALC 76 06/10/2019   TRIG 198 (H) 06/10/2019   CHOLHDL 4.6 06/10/2019     Diabetic foot exam done in 11/2018  Diabetic foot exam previously showed normal monofilament sensation in the toes and plantar surfaces, no skin lesions or ulcers on the feet and normal pedal pulses  Last eye exam documented on 02/26/2017     Examination:   BP (!) 170/84 (BP Location: Left Arm, Cuff Size: Normal)   Pulse 98   Ht 4'  10" (1.473 m)   Wt 122 lb (55.3 kg)   SpO2 94%   BMI 25.50 kg/m   Body mass index is 25.5 kg/m.     ASSESSMENT/ PLAN:   Diabetes type 2 insulin requiring  See history of present illness for detailed discussion of  current management, blood sugar patterns and problems identified  Her A1c is recently 8.2  She appears to have had increased insulin requirement since her episode of sepsis in the hospital Only in the last 5 days she has had better blood sugars with going up on the insulin doses as directed on her last visit Although her daughter is trying to help the patient still has difficulty following instructions and occasionally will miss her Levemir also  Also likely because of acute illness she has had significant hyperglycemia which appears to be persisting now She is checking blood sugars in the evenings but not in the last few days again Usually blood sugars are about the same throughout the day previously Glucose is 194 postprandially today in the office FASTING blood sugars are fairly consistently good with 24 units of Levemir now Also back on maximum dose Metformin with renal function being normal recently Not clear if she can take Jardiance without exacerbating her UTI and not clear if this is persisting currently  Recommendations:   She will continue to add some protein to her meals especially breakfast  She was instructed on not taking Humalog when she is not eating a proper meal and only a snack  She will as a precaution reduce Levemir by 2 units for now  Also for better compliance she can take her Levemir at suppertime instead of bedtime  She will keep her written record of her insulin doses for better compliance and given her a flowsheet for this  She will hold her Jardiance until it is clear whether she has resolved her UTI Also reduce coverage at lunch and dinner for Humalog to 8 and 4 units respectively Stay on Metformin unchanged Will need renal functions  on next visit  HYPOTHYROIDISM: She will go up to 50 mcg of levothyroxine since her TSH is higher and she is having more somnolence although this may be nonspecific  HYPERTENSION: Blood pressure may be higher from her not taking metoprolol, she will also follow-up next month with her PCP  She will schedule follow-up eye exam which is due   Patient Instructions  Levemir 22 units.   You can take this at the same time as your Humalog at dinnertime  May reduce the dose down to 20 if the morning sugars are getting below 100    Check blood sugars on waking up daily and preferably before every meal and bedtime  as much you can    HUMALOG: Continue 10 units before breakfast, take only 8 units before lunch and 4 units before dinner.  Try to take this before starting to eat   Recommended blood sugar levels on waking up  are 90-130 and about 2 hours after meal is 130-180   Stop taking JARDIANCE for now and only started if the urine infection is not present   Continue Metformin 1063m 2x daily   THYROID: Increase the dose to 2 tablets of the 25 mcg and will also give you a prescription for 1 tablet of the 50 mcg to take before breakfast daily       Mary Flynn 07/17/2019, 1:05 PM

## 2019-07-18 ENCOUNTER — Telehealth: Payer: Self-pay | Admitting: Internal Medicine

## 2019-07-18 DIAGNOSIS — H04123 Dry eye syndrome of bilateral lacrimal glands: Secondary | ICD-10-CM | POA: Diagnosis not present

## 2019-07-18 DIAGNOSIS — H16143 Punctate keratitis, bilateral: Secondary | ICD-10-CM | POA: Diagnosis not present

## 2019-07-18 DIAGNOSIS — H16213 Exposure keratoconjunctivitis, bilateral: Secondary | ICD-10-CM | POA: Diagnosis not present

## 2019-07-18 NOTE — Progress Notes (Signed)
  Chronic Care Management   Outreach Note  07/18/2019 Name: Mary Flynn MRN: [id number] DOB: [birth year]  Referred by: Colon Branch, MD Reason for referral : No chief complaint on file.   An unsuccessful telephone outreach was attempted today. The patient was referred to the pharmacist for assistance with care management and care coordination.   This note is not being shared with the patient for the following reason: To respect privacy (The patient or proxy has requested that the information not be shared).  Follow Up Plan:   Earney [facility] Scheduler

## 2019-07-19 DIAGNOSIS — I509 Heart failure, unspecified: Secondary | ICD-10-CM | POA: Diagnosis not present

## 2019-07-19 DIAGNOSIS — I272 Pulmonary hypertension, unspecified: Secondary | ICD-10-CM | POA: Diagnosis not present

## 2019-07-20 DIAGNOSIS — I110 Hypertensive heart disease with heart failure: Secondary | ICD-10-CM | POA: Diagnosis not present

## 2019-07-20 DIAGNOSIS — E785 Hyperlipidemia, unspecified: Secondary | ICD-10-CM | POA: Diagnosis not present

## 2019-07-20 DIAGNOSIS — I272 Pulmonary hypertension, unspecified: Secondary | ICD-10-CM | POA: Diagnosis not present

## 2019-07-20 DIAGNOSIS — G4733 Obstructive sleep apnea (adult) (pediatric): Secondary | ICD-10-CM | POA: Diagnosis not present

## 2019-07-20 DIAGNOSIS — N179 Acute kidney failure, unspecified: Secondary | ICD-10-CM | POA: Diagnosis not present

## 2019-07-20 DIAGNOSIS — E039 Hypothyroidism, unspecified: Secondary | ICD-10-CM | POA: Diagnosis not present

## 2019-07-20 DIAGNOSIS — N390 Urinary tract infection, site not specified: Secondary | ICD-10-CM | POA: Diagnosis not present

## 2019-07-20 DIAGNOSIS — R6521 Severe sepsis with septic shock: Secondary | ICD-10-CM | POA: Diagnosis not present

## 2019-07-20 DIAGNOSIS — I509 Heart failure, unspecified: Secondary | ICD-10-CM | POA: Diagnosis not present

## 2019-07-20 DIAGNOSIS — I248 Other forms of acute ischemic heart disease: Secondary | ICD-10-CM | POA: Diagnosis not present

## 2019-07-20 DIAGNOSIS — J9601 Acute respiratory failure with hypoxia: Secondary | ICD-10-CM | POA: Diagnosis not present

## 2019-07-20 DIAGNOSIS — E1165 Type 2 diabetes mellitus with hyperglycemia: Secondary | ICD-10-CM | POA: Diagnosis not present

## 2019-07-21 ENCOUNTER — Telehealth: Payer: Self-pay | Admitting: Internal Medicine

## 2019-07-21 ENCOUNTER — Other Ambulatory Visit: Payer: Self-pay

## 2019-07-21 MED ORDER — GABAPENTIN 100 MG PO CAPS: 100.0000 mg | ORAL_CAPSULE | Freq: Four times a day (QID) | ORAL | 11 refills | Status: DC

## 2019-07-21 NOTE — Telephone Encounter (Signed)
Called and spoke with patient about her Gabapentin. Looks like it was sent to [facility] outpatient pharmacy on 5/13. Patient states it is to expensive and when it is sent to that pharmacy they have to pay. She needs it sent to [facility] because then it will be free. RX has been sent to [facility]. Nothing further needed at this time.

## 2019-07-21 NOTE — Telephone Encounter (Signed)
LMOM w/ Claiborne Billings- verbal orders given.

## 2019-07-21 NOTE — Progress Notes (Signed)
Called and spoke with patient about her Gabapentin. Looks like it was sent to [facility] outpatient pharmacy on 5/13. Patient states it is to expensive and when it is sent to that pharmacy they have to pay. She needs it sent to [facility] because then it will be free. RX has been sent to [facility]. Nothing further needed at this time.

## 2019-07-21 NOTE — Telephone Encounter (Signed)
Caller/Agency: [facility] / [place] Number: [phone] Requesting OT/PT/Skilled Nursing/Social Work/Speech Therapy: PT  Frequency: 2 times a week for 3 weeks  Need a verbal order.

## 2019-07-23 ENCOUNTER — Other Ambulatory Visit: Payer: Self-pay | Admitting: Family

## 2019-07-25 DIAGNOSIS — N179 Acute kidney failure, unspecified: Secondary | ICD-10-CM | POA: Diagnosis not present

## 2019-07-25 DIAGNOSIS — E785 Hyperlipidemia, unspecified: Secondary | ICD-10-CM | POA: Diagnosis not present

## 2019-07-25 DIAGNOSIS — G4733 Obstructive sleep apnea (adult) (pediatric): Secondary | ICD-10-CM | POA: Diagnosis not present

## 2019-07-25 DIAGNOSIS — I248 Other forms of acute ischemic heart disease: Secondary | ICD-10-CM | POA: Diagnosis not present

## 2019-07-25 DIAGNOSIS — I110 Hypertensive heart disease with heart failure: Secondary | ICD-10-CM | POA: Diagnosis not present

## 2019-07-25 DIAGNOSIS — J9601 Acute respiratory failure with hypoxia: Secondary | ICD-10-CM | POA: Diagnosis not present

## 2019-07-25 DIAGNOSIS — R6521 Severe sepsis with septic shock: Secondary | ICD-10-CM | POA: Diagnosis not present

## 2019-07-25 DIAGNOSIS — N390 Urinary tract infection, site not specified: Secondary | ICD-10-CM | POA: Diagnosis not present

## 2019-07-25 DIAGNOSIS — I509 Heart failure, unspecified: Secondary | ICD-10-CM | POA: Diagnosis not present

## 2019-07-25 DIAGNOSIS — E039 Hypothyroidism, unspecified: Secondary | ICD-10-CM | POA: Diagnosis not present

## 2019-07-25 DIAGNOSIS — I272 Pulmonary hypertension, unspecified: Secondary | ICD-10-CM | POA: Diagnosis not present

## 2019-07-25 DIAGNOSIS — E1165 Type 2 diabetes mellitus with hyperglycemia: Secondary | ICD-10-CM | POA: Diagnosis not present

## 2019-07-27 ENCOUNTER — Telehealth: Payer: Self-pay

## 2019-07-27 ENCOUNTER — Telehealth: Payer: Self-pay | Admitting: Internal Medicine

## 2019-07-27 DIAGNOSIS — N390 Urinary tract infection, site not specified: Secondary | ICD-10-CM | POA: Diagnosis not present

## 2019-07-27 DIAGNOSIS — E1165 Type 2 diabetes mellitus with hyperglycemia: Secondary | ICD-10-CM | POA: Diagnosis not present

## 2019-07-27 DIAGNOSIS — G4733 Obstructive sleep apnea (adult) (pediatric): Secondary | ICD-10-CM | POA: Diagnosis not present

## 2019-07-27 DIAGNOSIS — E039 Hypothyroidism, unspecified: Secondary | ICD-10-CM | POA: Diagnosis not present

## 2019-07-27 DIAGNOSIS — I110 Hypertensive heart disease with heart failure: Secondary | ICD-10-CM | POA: Diagnosis not present

## 2019-07-27 DIAGNOSIS — E785 Hyperlipidemia, unspecified: Secondary | ICD-10-CM | POA: Diagnosis not present

## 2019-07-27 DIAGNOSIS — N179 Acute kidney failure, unspecified: Secondary | ICD-10-CM | POA: Diagnosis not present

## 2019-07-27 DIAGNOSIS — I248 Other forms of acute ischemic heart disease: Secondary | ICD-10-CM | POA: Diagnosis not present

## 2019-07-27 DIAGNOSIS — J9601 Acute respiratory failure with hypoxia: Secondary | ICD-10-CM | POA: Diagnosis not present

## 2019-07-27 DIAGNOSIS — I509 Heart failure, unspecified: Secondary | ICD-10-CM | POA: Diagnosis not present

## 2019-07-27 DIAGNOSIS — R6521 Severe sepsis with septic shock: Secondary | ICD-10-CM | POA: Diagnosis not present

## 2019-07-27 DIAGNOSIS — I272 Pulmonary hypertension, unspecified: Secondary | ICD-10-CM | POA: Diagnosis not present

## 2019-07-27 MED ORDER — POLYETHYLENE GLYCOL 3350 17 GM/SCOOP PO POWD: 17.0000 g | Freq: Two times a day (BID) | ORAL | 1 refills | Status: AC | PRN

## 2019-07-27 NOTE — Telephone Encounter (Signed)
Caller: Ladon  Patient states she would like a 90 days prescription and some refills.  Medication: Miralax powder     Has the patient contacted their pharmacy?  (If no, request that the patient contact the pharmacy for the refill.) (If yes, when and what did the pharmacy advise?)     Preferred Pharmacy (with phone number or street name)  [facility], [facility]  [address] [place] [address]  Phone:  [phone] Fax:  [phone]     Agent: Please be advised that RX refills may take up to 3 business days. We ask that you follow-up with your pharmacy.

## 2019-07-27 NOTE — Telephone Encounter (Signed)
Ov with NP to sort out what she really needs/ uses as there is a big communication issue her between pt and DME and this office - I doubt she has OSA that is signifiant enough to warrant cpap or that she'll use it.

## 2019-07-27 NOTE — Telephone Encounter (Signed)
Spoke with Nada Boozer he states the documentation in the chart states she is unable to do a sleep study due to her age. For Medicare guidelines they need a sleep titration study to give the patient oxygen through her insurance. The chart needs to specifically say that pt has OSA and needs treatment. We also have to send in another order and attach a respiratory diagnosis. I can rewrite prescription. MW please advise what the next step would be.    Acute respiratory failure with hypoxia -Multifactorial pulmonary hypertension, pulmonary edema. -Treat underlying conditions. -Titrate to maintain SPO2> 94% -Patient laying in bed for a nap saturation down to 86% on room air -Patient meets criteria for home O2. -2 L O2 via [facility] titrate to maintain SPO2>94%  OSA -Patient has never been officially evaluated however witnessed apneic events, and desaturates to low 80s when sleeping. -Schedule appointment with Dr. MichaelWertpulmonology for 1 to 2 weeks post discharge. Spirometry, DLCO. Sleep study required

## 2019-07-27 NOTE — Telephone Encounter (Signed)
Spoke with the pt's daughter and scheduled appt with TP for 08/11/19

## 2019-07-27 NOTE — Telephone Encounter (Signed)
Rx sent 

## 2019-08-01 ENCOUNTER — Other Ambulatory Visit: Payer: Self-pay

## 2019-08-01 ENCOUNTER — Other Ambulatory Visit (INDEPENDENT_AMBULATORY_CARE_PROVIDER_SITE_OTHER): Payer: Medicare Other

## 2019-08-01 DIAGNOSIS — E039 Hypothyroidism, unspecified: Secondary | ICD-10-CM

## 2019-08-01 DIAGNOSIS — E1165 Type 2 diabetes mellitus with hyperglycemia: Secondary | ICD-10-CM

## 2019-08-01 DIAGNOSIS — J9601 Acute respiratory failure with hypoxia: Secondary | ICD-10-CM | POA: Diagnosis not present

## 2019-08-01 DIAGNOSIS — I509 Heart failure, unspecified: Secondary | ICD-10-CM | POA: Diagnosis not present

## 2019-08-01 DIAGNOSIS — Z794 Long term (current) use of insulin: Secondary | ICD-10-CM

## 2019-08-01 DIAGNOSIS — I110 Hypertensive heart disease with heart failure: Secondary | ICD-10-CM | POA: Diagnosis not present

## 2019-08-01 DIAGNOSIS — I272 Pulmonary hypertension, unspecified: Secondary | ICD-10-CM | POA: Diagnosis not present

## 2019-08-01 DIAGNOSIS — N179 Acute kidney failure, unspecified: Secondary | ICD-10-CM | POA: Diagnosis not present

## 2019-08-01 DIAGNOSIS — I248 Other forms of acute ischemic heart disease: Secondary | ICD-10-CM | POA: Diagnosis not present

## 2019-08-01 DIAGNOSIS — N390 Urinary tract infection, site not specified: Secondary | ICD-10-CM | POA: Diagnosis not present

## 2019-08-01 DIAGNOSIS — E785 Hyperlipidemia, unspecified: Secondary | ICD-10-CM | POA: Diagnosis not present

## 2019-08-01 DIAGNOSIS — G4733 Obstructive sleep apnea (adult) (pediatric): Secondary | ICD-10-CM | POA: Diagnosis not present

## 2019-08-01 DIAGNOSIS — R6521 Severe sepsis with septic shock: Secondary | ICD-10-CM | POA: Diagnosis not present

## 2019-08-01 LAB — COMPREHENSIVE METABOLIC PANEL
ALT: 12 U/L (ref 0–35)
AST: 15 U/L (ref 0–37)
Albumin: 4.1 g/dL (ref 3.5–5.2)
Alkaline Phosphatase: 62 U/L (ref 39–117)
BUN: 12 mg/dL (ref 6–23)
CO2: 28 mEq/L (ref 19–32)
Calcium: 9.5 mg/dL (ref 8.4–10.5)
Chloride: 99 mEq/L (ref 96–112)
Creatinine, Ser: 0.81 mg/dL (ref 0.40–1.20)
GFR: 67.56 mL/min (ref >60.00)
Glucose, Bld: 106 mg/dL — ABNORMAL HIGH (ref 70–99)
Potassium: 4 mEq/L (ref 3.5–5.1)
Sodium: 135 mEq/L (ref 135–145)
Total Bilirubin: 1 mg/dL (ref 0.2–1.2)
Total Protein: 7.6 g/dL (ref 6.0–8.3)

## 2019-08-01 LAB — T4, FREE: Free T4: 1.39 ng/dL (ref 0.60–1.60)

## 2019-08-01 LAB — TSH: TSH: 2.94 u[IU]/mL (ref 0.35–4.50)

## 2019-08-02 ENCOUNTER — Telehealth: Payer: Self-pay

## 2019-08-02 ENCOUNTER — Telehealth: Payer: Self-pay | Admitting: Endocrinology

## 2019-08-02 LAB — FRUCTOSAMINE: Fructosamine: 316 umol/L — ABNORMAL HIGH (ref 0–285)

## 2019-08-02 NOTE — Telephone Encounter (Signed)
Patient requests to be called at [phone] - she has a question about her lancets.

## 2019-08-02 NOTE — Telephone Encounter (Signed)
Patient called to get a prescription refill for Probiotic Product (PROBIOTIC DAILY PO) [[id number]]    Please send it to  [facility], [address], [address], [place], [place] [address]  Phone:  [phone] Fax:  [phone]  DEA #:  --

## 2019-08-02 NOTE — Telephone Encounter (Signed)
This is an over the counter product.

## 2019-08-03 ENCOUNTER — Other Ambulatory Visit: Payer: Self-pay

## 2019-08-03 DIAGNOSIS — I509 Heart failure, unspecified: Secondary | ICD-10-CM | POA: Diagnosis not present

## 2019-08-03 DIAGNOSIS — E1140 Type 2 diabetes mellitus with diabetic neuropathy, unspecified: Secondary | ICD-10-CM

## 2019-08-03 DIAGNOSIS — E039 Hypothyroidism, unspecified: Secondary | ICD-10-CM | POA: Diagnosis not present

## 2019-08-03 DIAGNOSIS — I272 Pulmonary hypertension, unspecified: Secondary | ICD-10-CM | POA: Diagnosis not present

## 2019-08-03 DIAGNOSIS — N179 Acute kidney failure, unspecified: Secondary | ICD-10-CM | POA: Diagnosis not present

## 2019-08-03 DIAGNOSIS — E785 Hyperlipidemia, unspecified: Secondary | ICD-10-CM | POA: Diagnosis not present

## 2019-08-03 DIAGNOSIS — E1165 Type 2 diabetes mellitus with hyperglycemia: Secondary | ICD-10-CM | POA: Diagnosis not present

## 2019-08-03 DIAGNOSIS — N390 Urinary tract infection, site not specified: Secondary | ICD-10-CM | POA: Diagnosis not present

## 2019-08-03 DIAGNOSIS — I248 Other forms of acute ischemic heart disease: Secondary | ICD-10-CM | POA: Diagnosis not present

## 2019-08-03 DIAGNOSIS — R6521 Severe sepsis with septic shock: Secondary | ICD-10-CM | POA: Diagnosis not present

## 2019-08-03 DIAGNOSIS — J9601 Acute respiratory failure with hypoxia: Secondary | ICD-10-CM | POA: Diagnosis not present

## 2019-08-03 DIAGNOSIS — G4733 Obstructive sleep apnea (adult) (pediatric): Secondary | ICD-10-CM | POA: Diagnosis not present

## 2019-08-03 DIAGNOSIS — I110 Hypertensive heart disease with heart failure: Secondary | ICD-10-CM | POA: Diagnosis not present

## 2019-08-03 MED ORDER — HUMALOG 100 UNIT/ML ~~LOC~~ SOCT: SUBCUTANEOUS | 0 refills | Status: AC

## 2019-08-03 MED ORDER — METFORMIN HCL 1000 MG PO TABS: ORAL_TABLET | ORAL | 0 refills | Status: AC

## 2019-08-03 MED ORDER — INSULIN DETEMIR 100 UNIT/ML FLEXPEN: 24.0000 [IU] | PEN_INJECTOR | Freq: Every day | SUBCUTANEOUS | 0 refills | Status: AC

## 2019-08-03 MED ORDER — PEN NEEDLES 32G X 4 MM MISC: 0 refills | Status: AC

## 2019-08-03 MED ORDER — LEVOTHYROXINE SODIUM 25 MCG PO TABS: ORAL_TABLET | ORAL | 0 refills | Status: AC

## 2019-08-03 MED ORDER — GABAPENTIN 100 MG PO CAPS: 100.0000 mg | ORAL_CAPSULE | Freq: Four times a day (QID) | ORAL | 0 refills | Status: DC

## 2019-08-03 MED ORDER — ONETOUCH DELICA LANCETS 30G MISC: 1.0000 | Freq: Two times a day (BID) | 3 refills | Status: AC

## 2019-08-03 MED ORDER — GLUCOSE BLOOD VI STRP: ORAL_STRIP | 3 refills | Status: AC

## 2019-08-03 MED ORDER — ATORVASTATIN CALCIUM 10 MG PO TABS
10.0000 mg | ORAL_TABLET | Freq: Every day | ORAL | 0 refills | Status: AC
Start: 2019-08-03 — End: ?

## 2019-08-03 NOTE — Telephone Encounter (Signed)
6 month Rx has been sent for medications.

## 2019-08-03 NOTE — Telephone Encounter (Signed)
P/t's daughter informed

## 2019-08-03 NOTE — Telephone Encounter (Signed)
PT and her daughter called and requested that a new Rx for test strips and lancets be sent to [facility]. This was done. Pt also requested that a 6 month supply of medications be sent at one time to be filled because she is leaving the country to go with her daughter to [place] for approx 5-6 months. I informed pt that Dr. Dwyane Dee may be agreeable to do this, but we cannot be sure that the pharmacy and insurance will allow a fill of this magnitude at once. Pt and daughter did verbalize understanding of this, and they were informed that Dr. Dwyane Dee would be asked and they will be notified of his decision.

## 2019-08-03 NOTE — Telephone Encounter (Signed)
We can try sending 94-month prescription

## 2019-08-08 ENCOUNTER — Encounter: Payer: Self-pay | Admitting: Endocrinology

## 2019-08-08 ENCOUNTER — Ambulatory Visit (INDEPENDENT_AMBULATORY_CARE_PROVIDER_SITE_OTHER): Payer: Medicare Other | Admitting: Endocrinology

## 2019-08-08 ENCOUNTER — Telehealth: Payer: Self-pay | Admitting: Internal Medicine

## 2019-08-08 ENCOUNTER — Other Ambulatory Visit: Payer: Self-pay

## 2019-08-08 ENCOUNTER — Ambulatory Visit (INDEPENDENT_AMBULATORY_CARE_PROVIDER_SITE_OTHER): Payer: Medicare Other | Admitting: Internal Medicine

## 2019-08-08 ENCOUNTER — Encounter: Payer: Self-pay | Admitting: Internal Medicine

## 2019-08-08 VITALS — BP 122/70 | HR 94 | Ht <= 58 in | Wt 121.6 lb

## 2019-08-08 VITALS — BP 148/84 | HR 96 | Temp 97.3°F | Resp 18 | Ht <= 58 in | Wt 121.2 lb

## 2019-08-08 DIAGNOSIS — R109 Unspecified abdominal pain: Secondary | ICD-10-CM

## 2019-08-08 DIAGNOSIS — E039 Hypothyroidism, unspecified: Secondary | ICD-10-CM | POA: Diagnosis not present

## 2019-08-08 DIAGNOSIS — R7881 Bacteremia: Secondary | ICD-10-CM | POA: Diagnosis not present

## 2019-08-08 DIAGNOSIS — I10 Essential (primary) hypertension: Secondary | ICD-10-CM

## 2019-08-08 DIAGNOSIS — R0902 Hypoxemia: Secondary | ICD-10-CM | POA: Diagnosis not present

## 2019-08-08 DIAGNOSIS — Z794 Long term (current) use of insulin: Secondary | ICD-10-CM | POA: Diagnosis not present

## 2019-08-08 DIAGNOSIS — E1165 Type 2 diabetes mellitus with hyperglycemia: Secondary | ICD-10-CM | POA: Diagnosis not present

## 2019-08-08 DIAGNOSIS — B9620 Unspecified Escherichia coli [E. coli] as the cause of diseases classified elsewhere: Secondary | ICD-10-CM

## 2019-08-08 MED ORDER — MONTELUKAST SODIUM 10 MG PO TABS: 10.0000 mg | ORAL_TABLET | Freq: Every day | ORAL | 0 refills | Status: AC

## 2019-08-08 MED ORDER — DULOXETINE HCL 30 MG PO CPEP: 30.0000 mg | ORAL_CAPSULE | Freq: Every day | ORAL | 0 refills | Status: AC

## 2019-08-08 MED ORDER — GABAPENTIN 100 MG PO CAPS: 100.0000 mg | ORAL_CAPSULE | Freq: Four times a day (QID) | ORAL | 0 refills | Status: AC

## 2019-08-08 MED ORDER — FAMOTIDINE 20 MG PO TABS: 20.0000 mg | ORAL_TABLET | Freq: Every day | ORAL | 0 refills | Status: AC

## 2019-08-08 NOTE — Telephone Encounter (Signed)
Requested prescriptions were sent to mail order today as requested.

## 2019-08-08 NOTE — Progress Notes (Signed)
Subjective:    Patient ID: Mary Flynn, female    DOB: 1934/08/30, 85 y.o.   MRN: [id number]  DOS:  08/08/2019 Type of visit - description: Follow-up, here with her daughter. Since the last office visit she is doing well. Good compliance with medication. Saw endocrinology, note reviewed.  Wt Readings from Last 3 Encounters:  08/09/19 122 lb (55.3 kg)  08/08/19 121 lb 9.6 oz (55.2 kg)  08/08/19 121 lb 4 oz (55 kg)   BP Readings from Last 3 Encounters:  08/09/19 122/70  08/08/19 122/70  08/08/19 (!) 148/84     Review of Systems Denies fever chills Abdominal pain decreased Constipation decreased No blood in the stools No dysuria or gross hematuria  Past Medical History:  Diagnosis Date  . Diabetes mellitus   . Hyperlipidemia   . Hypertension   . Left rib fracture 02/18/2014    Past Surgical History:  Procedure Laterality Date  . APPENDECTOMY    . REFRACTIVE SURGERY     Left Eye  . surgical sterilization  40 years ago    Allergies as of 08/08/2019      Reactions   Ace Inhibitors Cough   Losartan Cough   Penicillins Hives   Sulfa Antibiotics Hives   Sulfonamide Derivatives Hives      Medication List       Accurate as of August 08, 2019 11:59 PM. If you have any questions, ask your nurse or doctor.        albuterol 108 (90 Base) MCG/ACT inhaler Commonly known as: ProAir HFA Inhale 2 puffs into the lungs every 6 (six) hours as needed for wheezing or shortness of breath.   aspirin EC 81 MG tablet Take 81 mg by mouth daily.   atorvastatin 10 MG tablet Commonly known as: LIPITOR Take 1 tablet (10 mg total) by mouth daily.   b complex vitamins tablet Take 1 tablet by mouth daily.   DULoxetine 30 MG capsule Commonly known as: CYMBALTA Take 1 capsule (30 mg total) by mouth daily.   famotidine 20 MG tablet Commonly known as: PEPCID Take 1 tablet (20 mg total) by mouth at bedtime.   furosemide 40 MG tablet Commonly known as: Lasix Take 1 tablet (40  mg total) by mouth daily as needed for fluid (for  weight gain in 24-hour period of > 5 pounds).   gabapentin 100 MG capsule Commonly known as: NEURONTIN Take 1 capsule (100 mg total) by mouth 4 (four) times daily.   glucose blood test strip Use OneTouch Verio Flex test strips as instructed to check blood sugar twice daily.   HAIR SKIN AND NAILS FORMULA PO Take by mouth daily.   Womens 50+ Multi Vitamin/Min Tabs Take 1 tablet by mouth daily.   HumaLOG 100 UNIT/ML cartridge Generic drug: insulin lispro Inject 10 units under the skin with breakfast and lunch and 6 units at evening meal   insulin detemir 100 UNIT/ML FlexPen Commonly known as: Levemir FlexPen Inject 24 Units into the skin at bedtime. What changed: how much to take   Insulin Pen Needle 32G X 4 MM Misc Use 3 per day   Pen Needles 32G X 4 MM Misc Use as instructed to inject insulin 4 times daily.   latanoprost 0.005 % ophthalmic solution Commonly known as: XALATAN Place 1 drop into both eyes at bedtime.   levocetirizine 5 MG tablet Commonly known as: XYZAL Take 1 tablet (5 mg total) by mouth every evening.   levothyroxine 25 MCG  tablet Commonly known as: SYNTHROID TAKE 1 TABLET BY MOUTH  DAILY BEFORE BREAKFAST What changed: additional instructions   melatonin 3 MG Tabs tablet Take 2 tablets (6 mg total) by mouth at bedtime as needed (Sleep).   metFORMIN 1000 MG tablet Commonly known as: GLUCOPHAGE TAKE 1 TABLET BY MOUTH  TWICE DAILY WITH A MEAL   metoprolol succinate 100 MG 24 hr tablet Commonly known as: TOPROL-XL TAKE 1 TABLET BY MOUTH  DAILY WITH OR IMMEDIATELY  FOLLOWING A MEAL   montelukast 10 MG tablet Commonly known as: SINGULAIR Take 1 tablet (10 mg total) by mouth at bedtime.   NONFORMULARY OR COMPOUNDED ITEM Lumbar support belt  #1  As directed   nystatin ointment Commonly known as: MYCOSTATIN APPLY TOPICALLY TWO TIMES  DAILY AS NEEDED   OneTouch Delica Lancets 73X Misc 1 each by  Does not apply route 2 (two) times daily. Use OneTouch Delica lancets to check blood sugar twice daily.   OneTouch Verio Flex System w/Device Kit 1 each by Does not apply route 2 (two) times daily. Use Onetouch verio flex to check blood sugar twice daily.   OXYGEN Inhale 2 L into the lungs at bedtime.   pantoprazole 40 MG tablet Commonly known as: PROTONIX TAKE 1 TABLET BY MOUTH  DAILY   polyethylene glycol powder 17 GM/SCOOP powder Commonly known as: GLYCOLAX/MIRALAX Take 17 g by mouth 2 (two) times daily as needed for moderate constipation.   PROBIOTIC DAILY PO Take 1 tablet by mouth daily.   timolol 0.5 % ophthalmic solution Commonly known as: BETIMOL Place 1 drop into both eyes 2 (two) times daily. What changed: when to take this   Vitamin C 500 MG Caps Take 1 tablet by mouth daily.   vitamin E 180 MG (400 UNITS) capsule Take 400 Units by mouth daily.          Objective:   Physical Exam BP (!) 148/84 (BP Location: Left Arm, Patient Position: Sitting, Cuff Size: Small)   Pulse 96   Temp (!) 97.3 F (36.3 C) (Temporal)   Resp 18   Ht _0  (1.473 m)   Wt 121 lb 4 oz (55 kg)   SpO2 96%   BMI 25.34 kg/m  General:   Well developed, NAD, BMI noted.  HEENT:  Normocephalic . Face symmetric, atraumatic.  Not pale Lungs:  CTA B Normal respiratory effort, no intercostal retractions, no accessory muscle use. Heart: RRR,  no murmur.  Abdomen:  Not distended, soft, non-tender. No rebound or rigidity.   Skin: Not pale. Not jaundice Lower extremities: no pretibial edema bilaterally  Neurologic:  alert & oriented X3.  Speech normal, gait appropriate for age, unassisted Psych--  Cognition and judgment appear intact.  Cooperative with normal attention span and concentration.  Behavior appropriate. No anxious or depressed appearing.     Assessment    Assessment DM with peripheral neuropathy, Dr. Dwyane Dee HTN DOE (has seen pulmonary,  cardiology) Hyperlipidemia Hypothyroidism Chronic bronchitis H/o compression fracture L2  PLAN: Acute respiratory failure with hypoxia: She seems to be better, on nocturnal O2.  Physical therapy visits her regularly and O2 sats even with exertion are> 90% DM: Saw endocrinology June 15, they noted increase insulin requirements since her recent sepsis and also difficulty following instruction, occasionally misses a insulin shot. Hypothyroidism: Per Endo  HTN: BP today improved, recommend good compliance with all meds including Lasix, metoprolol  Abdominal pain: Much improved, last CBC stable, I fob pending.  She is on probiotics  and MiraLAX daily and feels that that is helping, had some constipation which is resolved. ESBL E. coli bacteremia: See previous visit comments, no UTI symptoms. Travel plans: Request a letter stating that is safe for her to fly to [place], the daughter the patient feels confident that she is able to tolerate.  She indeed looks stronger today.  Letter provided. Multiple refills provided. RTC in few months when she is back from [place].   This visit occurred during the SARS-CoV-2 public health emergency.  Safety protocols were in place, including screening questions prior to the visit, additional usage of staff PPE, and extensive cleaning of exam room while observing appropriate contact time as indicated for disinfecting solutions.

## 2019-08-08 NOTE — Progress Notes (Signed)
Patient ID: Mary Flynn, female   DOB: [birth year], 85 y.o.   MRN: [id number]   Reason for Appointment: Diabetes and general follow-up   History of Present Illness   Diagnosis: Type 2 DIABETES MELITUS, date of diagnosis:   1995    Previous history: She has been on various regimens of hypoglycemic drugs over the last several years including Actos, Amaryl and Januvia. Because of cost her Januvia was stopped and Actos was stopped because of potential weight gain She was initially started on Levemir insulin because of fasting hyperglycemia in 2009 and the dose has been adjusted periodically In the last year she is also quite mealtime coverage because of postprandial hyperglycemia despite usually eating a low carbohydrate diet   Recent history:   Insulin regimen: Levemir 22 units hs;  Humalog 10 at breakfast, 8 at lunch and 4  acs  Oral hypoglycemic drugs: Metformin 1000 mg twice daily, Jardiance 10 mg daily  Current blood sugar patterns and problems identified:  Her A1c on her hospital admission was 8.2, previously 7.4 Fructosamine is now 316   She has been only checking her blood sugars in the mornings lately even though last month she had done some readings later in the day  She was also given a record sheet to put down her insulin doses and blood sugars but she stopped using this about a week ago and it was only her insulin doses she was writing down  She still does not understand how to adjust her Levemir and is adjusting it in the evening based on how much she is eating  However she is well compliant with this with taking it at dinnertime with her Humalog  Although she does not have any hypoglycemia she is stating that she will take up to 8 units of Humalog at mealtimes even if she is eating very little  Weight has leveled off  She was told to restart Jardiance if her UTI had cleared up but her daughter is concerned about recurrence with this, she has only started this  about 3 or 4 days ago  Currently FASTING blood sugars are excellent although mostly below 100 about a week ago  She has done a regular walking also although gets tired Lab glucose was nonfasting and was normal    Side effects from medications: None.                Monitors blood glucose:  Usually once  a day.    Glucometer:  One Touch Verio        Blood Glucose readings and averages from meter download for the last 2 weeks:    PRE-MEAL Fasting Lunch Dinner Bedtime Overall  Glucose range:  86-256   127    Mean/median:  119     117   Previous readings:  PRE-MEAL Fasting Lunch Dinner Bedtime Overall  Glucose range:  100-276  173, 281  60  215-268   Mean/median:  170    190    Meals: 3 meals per day she is a vegetarian. Trying to get some protein at most meals including dairy products and tofu  Has breakfast at 10 am,  lunch around 2 PM, dinner at 8 PM           Last visit with dietitian: 2009   Wt Readings from Last 3 Encounters:  08/08/19 121 lb 9.6 oz (55.2 kg)  08/08/19 121 lb 4 oz (55 kg)  07/17/19 122 lb (55.3 kg)  LABS:  Lab Results  Component Value Date   HGBA1C 8.2 (H) 05/30/2019   HGBA1C 7.4 (H) 03/15/2019   HGBA1C 7.6 (H) 12/08/2018   Lab Results  Component Value Date   MICROALBUR 14.7 (H) 06/21/2019   LDLCALC 76 06/10/2019   CREATININE 0.81 08/01/2019    Other issues discussed today: See review of systems   Allergies as of 08/08/2019      Reactions   Ace Inhibitors Cough   Losartan Cough   Penicillins Hives   Sulfa Antibiotics Hives   Sulfonamide Derivatives Hives      Medication List       Accurate as of August 08, 2019  1:42 PM. If you have any questions, ask your nurse or doctor.        albuterol 108 (90 Base) MCG/ACT inhaler Commonly known as: ProAir HFA Inhale 2 puffs into the lungs every 6 (six) hours as needed for wheezing or shortness of breath.   aspirin EC 81 MG tablet Take 81 mg by mouth daily.   atorvastatin 10 MG  tablet Commonly known as: LIPITOR Take 1 tablet (10 mg total) by mouth daily.   b complex vitamins tablet Take 1 tablet by mouth daily.   DULoxetine 30 MG capsule Commonly known as: CYMBALTA Take 1 capsule (30 mg total) by mouth daily.   famotidine 20 MG tablet Commonly known as: PEPCID Take 1 tablet (20 mg total) by mouth at bedtime.   furosemide 40 MG tablet Commonly known as: Lasix Take 1 tablet (40 mg total) by mouth daily as needed for fluid (for  weight gain in 24-hour period of > 5 pounds).   gabapentin 100 MG capsule Commonly known as: NEURONTIN Take 1 capsule (100 mg total) by mouth 4 (four) times daily.   glucose blood test strip Use OneTouch Verio Flex test strips as instructed to check blood sugar twice daily.   HAIR SKIN AND NAILS FORMULA PO Take by mouth daily.   Womens 50+ Multi Vitamin/Min Tabs Take 1 tablet by mouth daily.   HumaLOG 100 UNIT/ML cartridge Generic drug: insulin lispro Inject 10 units under the skin with breakfast and lunch and 6 units at evening meal   insulin detemir 100 UNIT/ML FlexPen Commonly known as: Levemir FlexPen Inject 24 Units into the skin at bedtime. What changed: how much to take   Insulin Pen Needle 32G X 4 MM Misc Use 3 per day   Pen Needles 32G X 4 MM Misc Use as instructed to inject insulin 4 times daily.   latanoprost 0.005 % ophthalmic solution Commonly known as: XALATAN Place 1 drop into both eyes at bedtime.   levocetirizine 5 MG tablet Commonly known as: XYZAL Take 1 tablet (5 mg total) by mouth every evening.   levothyroxine 25 MCG tablet Commonly known as: SYNTHROID TAKE 1 TABLET BY MOUTH  DAILY BEFORE BREAKFAST What changed: additional instructions   melatonin 3 MG Tabs tablet Take 2 tablets (6 mg total) by mouth at bedtime as needed (Sleep).   metFORMIN 1000 MG tablet Commonly known as: GLUCOPHAGE TAKE 1 TABLET BY MOUTH  TWICE DAILY WITH A MEAL   metoprolol succinate 100 MG 24 hr  tablet Commonly known as: TOPROL-XL TAKE 1 TABLET BY MOUTH  DAILY WITH OR IMMEDIATELY  FOLLOWING A MEAL   montelukast 10 MG tablet Commonly known as: SINGULAIR Take 1 tablet (10 mg total) by mouth at bedtime.   NONFORMULARY OR COMPOUNDED ITEM Lumbar support belt  #1  As directed  nystatin ointment Commonly known as: MYCOSTATIN APPLY TOPICALLY TWO TIMES  DAILY AS NEEDED   OneTouch Delica Lancets 78G Misc 1 each by Does not apply route 2 (two) times daily. Use OneTouch Delica lancets to check blood sugar twice daily.   OneTouch Verio Flex System w/Device Kit 1 each by Does not apply route 2 (two) times daily. Use Onetouch verio flex to check blood sugar twice daily.   OXYGEN Inhale 2 L into the lungs at bedtime.   pantoprazole 40 MG tablet Commonly known as: PROTONIX TAKE 1 TABLET BY MOUTH  DAILY   polyethylene glycol powder 17 GM/SCOOP powder Commonly known as: GLYCOLAX/MIRALAX Take 17 g by mouth 2 (two) times daily as needed for moderate constipation.   PROBIOTIC DAILY PO Take 1 tablet by mouth daily.   timolol 0.5 % ophthalmic solution Commonly known as: BETIMOL Place 1 drop into both eyes 2 (two) times daily. What changed: when to take this   Vitamin C 500 MG Caps Take 1 tablet by mouth daily.   vitamin E 180 MG (400 UNITS) capsule Take 400 Units by mouth daily.       Allergies:  Allergies  Allergen Reactions  . Ace Inhibitors Cough  . Losartan Cough  . Penicillins Hives  . Sulfa Antibiotics Hives  . Sulfonamide Derivatives Hives    Past Medical History:  Diagnosis Date  . Diabetes mellitus   . Hyperlipidemia   . Hypertension   . Left rib fracture 02/18/2014    Past Surgical History:  Procedure Laterality Date  . APPENDECTOMY    . REFRACTIVE SURGERY     Left Eye  . surgical sterilization  40 years ago    Family History  Problem Relation Age of Onset  . Diabetes Mother   . Hypertension Father   . Depression Other   . Cancer Other         laryngeal  . Diabetes Other     Social History:  reports that she has never smoked. She has never used smokeless tobacco. She reports that she does not drink alcohol and does not use drugs.  Review of Systems:   HYPOTHYROIDISM:  This previously has been mild and not clear if she had baseline symptoms except mild hair loss Had been started on 25 mcg levothyroxine by PCP  On her last visit thyroid levels show high TSH level and her dose was increased to 50 mcg She still tends to get fatigued easily although her TSH is back to normal  Lab Results  Component Value Date   TSH 2.94 08/01/2019   TSH 5.17 (H) 06/21/2019   TSH 1.53 05/24/2019   FREET4 1.39 08/01/2019   FREET4 1.05 06/21/2019   FREET4 1.17 12/08/2018    Hypertension:    Previously treated with valsartan, now only taking metoprolol Blood pressure is improved today although fluctuates  BP Readings from Last 3 Encounters:  08/08/19 122/70  08/08/19 (!) 148/84  07/17/19 (!) 170/84    Lab Results  Component Value Date   CREATININE 0.81 08/01/2019   BUN 12 08/01/2019   NA 135 08/01/2019   K 4.0 08/01/2019   CL 99 08/01/2019   CO2 28 08/01/2019    Lipids: Has had diabetic dyslipidemia, LDL well controlled and followed by her PCP   Lab Results  Component Value Date   CHOL 148 06/10/2019   HDL 32 (L) 06/10/2019   LDLCALC 76 06/10/2019   TRIG 198 (H) 06/10/2019   CHOLHDL 4.6 06/10/2019  Diabetic foot exam done in 11/2018  Diabetic foot exam previously showed normal monofilament sensation in the toes and plantar surfaces, no skin lesions or ulcers on the feet and normal pedal pulses  Last eye exam documented on 02/26/2017     Examination:   BP 122/70 (BP Location: Left Arm, Patient Position: Sitting, Cuff Size: Normal)   Pulse 94   Ht _0  (1.473 m)   Wt 121 lb 9.6 oz (55.2 kg)   SpO2 93%   BMI 25.41 kg/m   Body mass index is 25.41 kg/m.   No ankle edema present  ASSESSMENT/ PLAN:    Diabetes type 2 insulin requiring  See history of present illness for detailed discussion of  current management, blood sugar patterns and problems identified  Her A1c was last 8.2 in April  Blood sugars in the last few days are excellent with near normal fasting readings except once However she is not checking readings after meals and not clear how often she has high readings, was having a few readings over 200 about 3 to 4 weeks ago  She is still not adjusting her Levemir based on fasting readings Again not understanding how mealtime dose needs to be adjusted based on her portion size as well as need to have information on postprandial readings Although Jardiance would be likely helpful not clear if this has any role in her UTIs  Recommendations:  She will take 20 units of Levemir instead of 22 to avoid potential of low sugar She will only go up on this if her morning sugars are consistently over 140 However if morning readings are below 90 consistently she will drop the dose of another 2 units Discussed adjusting Humalog based on meal size and not taking it if she is only eating a snack She needs to check readings after meals at least once a day by rotation at different times Continue Metformin but stop Jardiance for now  May consider Jardiance again in 3 months if she has no recurrent UTI for long-term cardiovascular benefits  HYPOTHYROIDISM: She continue 50 mcg of levothyroxine since her TSH is back to normal      Patient Instructions  20 Levemir daily unless am sugar stays  <90 or 140  Check blood sugars on waking up 5 days a week  Also check blood sugars about 2 hours after meals and do this after different meals by rotation  Recommended blood sugar levels on waking up are 90-140 and about 2 hours after meal is 130-180  Please bring your blood sugar monitor to each visit, thank you         Elayne Snare 08/08/2019, 1:42 PM

## 2019-08-08 NOTE — Patient Instructions (Addendum)
20 Levemir daily unless am sugar stays  <90 or 140  Check blood sugars on waking up 5 days a week  Also check blood sugars about 2 hours after meals and do this after different meals by rotation  Recommended blood sugar levels on waking up are 90-140 and about 2 hours after meal is 130-180   Hold Jardiance

## 2019-08-08 NOTE — Telephone Encounter (Signed)
Patient wants to make sure , medications are being sent to mail order script , and not the normal pharmacy , states this was reviewed during visit with Dr. Larose Kells. Please advise

## 2019-08-08 NOTE — Progress Notes (Signed)
Pre visit review using our clinic review tool, if applicable. No additional management support is needed unless otherwise documented below in the visit note. 

## 2019-08-08 NOTE — Patient Instructions (Signed)
Continue the same medications  Please call and schedule a visit once you get back from [place].  Have a great trip

## 2019-08-09 ENCOUNTER — Encounter: Payer: Self-pay | Admitting: Adult Health

## 2019-08-09 ENCOUNTER — Ambulatory Visit: Payer: Medicare Other | Admitting: Adult Health

## 2019-08-09 VITALS — BP 122/70 | HR 90 | Wt 122.0 lb

## 2019-08-09 DIAGNOSIS — R6521 Severe sepsis with septic shock: Secondary | ICD-10-CM | POA: Diagnosis not present

## 2019-08-09 DIAGNOSIS — I509 Heart failure, unspecified: Secondary | ICD-10-CM | POA: Diagnosis not present

## 2019-08-09 DIAGNOSIS — G4734 Idiopathic sleep related nonobstructive alveolar hypoventilation: Secondary | ICD-10-CM | POA: Diagnosis not present

## 2019-08-09 DIAGNOSIS — I110 Hypertensive heart disease with heart failure: Secondary | ICD-10-CM | POA: Diagnosis not present

## 2019-08-09 DIAGNOSIS — R05 Cough: Secondary | ICD-10-CM

## 2019-08-09 DIAGNOSIS — E1165 Type 2 diabetes mellitus with hyperglycemia: Secondary | ICD-10-CM | POA: Diagnosis not present

## 2019-08-09 DIAGNOSIS — J9601 Acute respiratory failure with hypoxia: Secondary | ICD-10-CM | POA: Diagnosis not present

## 2019-08-09 DIAGNOSIS — R053 Chronic cough: Secondary | ICD-10-CM

## 2019-08-09 DIAGNOSIS — E039 Hypothyroidism, unspecified: Secondary | ICD-10-CM | POA: Diagnosis not present

## 2019-08-09 DIAGNOSIS — I248 Other forms of acute ischemic heart disease: Secondary | ICD-10-CM | POA: Diagnosis not present

## 2019-08-09 DIAGNOSIS — N179 Acute kidney failure, unspecified: Secondary | ICD-10-CM | POA: Diagnosis not present

## 2019-08-09 DIAGNOSIS — E785 Hyperlipidemia, unspecified: Secondary | ICD-10-CM | POA: Diagnosis not present

## 2019-08-09 DIAGNOSIS — G4733 Obstructive sleep apnea (adult) (pediatric): Secondary | ICD-10-CM | POA: Diagnosis not present

## 2019-08-09 DIAGNOSIS — J9611 Chronic respiratory failure with hypoxia: Secondary | ICD-10-CM | POA: Diagnosis not present

## 2019-08-09 DIAGNOSIS — I272 Pulmonary hypertension, unspecified: Secondary | ICD-10-CM | POA: Diagnosis not present

## 2019-08-09 DIAGNOSIS — N390 Urinary tract infection, site not specified: Secondary | ICD-10-CM | POA: Diagnosis not present

## 2019-08-09 NOTE — Patient Instructions (Signed)
Continue on current regimen  Continue on Oxygen 1.5 l/m At bedtime   Call back if you change mind on sleep study or can look at when you return home .  Follow up with Dr. Melvyn Novas  In 6 months (when you return from [place]) .

## 2019-08-09 NOTE — Progress Notes (Signed)
_0  ID: Marilynne Halsted, female    DOB: December 19, 1934, 85 y.o.   MRN: [id number]  Chief Complaint  Patient presents with  . Follow-up    Cough     Referring provider: Colon Branch, MD  HPI: 85 year old female never smoker followed for chronic cough and chronic rhinitis Medical history significant for diabetes, hypertension, hyperlipidemia  TEST/EVENTS :  GI w/u 2004:  Mod BS nl, nl 24 h pH off all GI meds > rec d/c GERD rx  Per Dr Henrene Pastor   [id number] showed mild restriction with no airflow obstruction and a diffusing defect.  CT chest 2017 showed stable linear scarring in both lungs stable groundglass opacities likely consistent with chronic inflammation. CT sinus February 2015 chronic sphenoid sinusitis septal deviation CT chest 04/05/13 >Patchy areas of paraseptal and central lobar emphysema. . Patchy ground-glass opacities suggest a partial airspace filling process and could reflect mild edema or inflammation. . Linear scarring changes in both lungs. 2018 Eosinophils 100  Chest x-ray September 2019 bronchitic changes with scarring in both lungs 04/2017 - nml ESR, neg ANA and RA factor   08/09/19 Follow up : Cough , O2  Patient returns for a follow-up visit.  She is accompanied by her family members.  Patient says she is starting to feel better.  She says currently her cough is totally resolved.  She says she does get short of breath with activities.  But is getting stronger since her hospitalization earlier this year.  She is accompanied by her daughter who lives in [place].  Patient and her husband are planning to go back with her daughter to [place] for the next 6 months and live.  They are requesting an order for oxygen concentrator.  Currently her insurance will not pay for a concentrator because it has been suggested that she may have underlying obstructive sleep apnea.  We discussed her recent hospitalization and concern for underlying sleep apnea with her pulmonary hypertension  diagnosis.  Also would need to set up for a sleep study to rule out underlying obstructive sleep apnea.  After much discussion and patient education.  They have decided to wait until she returns back and will try to buy oxygen concentrator themselves.  Patient is leaving in less than 10 days and it doesn't appear that we would be able to get a sleep study done in that timeframe also patient is not sure that she would want to wear a CPAP if she had sleep apnea. Walk test in the office shows no desaturations with O2 saturations greater than 92% with ambulation on room air.  She was admitted in April with septic shock with urosepsis and bacteremia.  Patient was found to have volume overload with pulmonary edema and moderate pulmonary hypertension.  She was suspected to have some underlying obstructive sleep apnea as she had some witnessed apneic events.  She was started on nocturnal oxygen at 2 L. 2D echo on May 31, 2019 showed an EF at 70 to 75%, left ventricle had hyperdynamic function.  There was moderate left ventricular hypertrophy.  Moderately elevated pulmonary artery systolic pressure with RVSP at 62 mmHg.  Allergies  Allergen Reactions  . Ace Inhibitors Cough  . Losartan Cough  . Penicillins Hives  . Sulfa Antibiotics Hives  . Sulfonamide Derivatives Hives    Immunization History  Administered Date(s) Administered  . Fluad Quad(high Dose 65+) 11/18/2018  . Influenza Split 11/24/2010  . Influenza Whole 11/30/2007, 12/05/2008, 12/04/2009  . Influenza, High Dose Seasonal  PF 11/17/2013, 11/08/2014, 11/15/2015, 11/23/2016, 10/29/2017  . Influenza,inj,Quad PF,6+ Mos 11/18/2012  . PFIZER SARS-COV-2 Vaccination 02/24/2019, 03/27/2019  . PPD Test 02/16/2011, 04/14/2011  . Pneumococcal Conjugate-13 02/02/2014  . Pneumococcal Polysaccharide-23 12/09/2007  . Td 10/12/2006  . Zoster 06/04/2010    Past Medical History:  Diagnosis Date  . Diabetes mellitus   . Hyperlipidemia   .  Hypertension   . Left rib fracture 02/18/2014    Tobacco History: Social History   Tobacco Use  Smoking Status Never Smoker  Smokeless Tobacco Never Used   Counseling given: Not Answered   Outpatient Medications Prior to Visit  Medication Sig Dispense Refill  . albuterol (PROAIR HFA) 108 (90 Base) MCG/ACT inhaler Inhale 2 puffs into the lungs every 6 (six) hours as needed for wheezing or shortness of breath. 18 g 2  . Ascorbic Acid (VITAMIN C) 500 MG CAPS Take 1 tablet by mouth daily.    Marland Kitchen aspirin EC 81 MG tablet Take 81 mg by mouth daily.    Marland Kitchen atorvastatin (LIPITOR) 10 MG tablet Take 1 tablet (10 mg total) by mouth daily. 180 tablet 0  . b complex vitamins tablet Take 1 tablet by mouth daily.    . Blood Glucose Monitoring Suppl (ONETOUCH VERIO FLEX SYSTEM) w/Device KIT 1 each by Does not apply route 2 (two) times daily. Use Onetouch verio flex to check blood sugar twice daily.    . DULoxetine (CYMBALTA) 30 MG capsule Take 1 capsule (30 mg total) by mouth daily. 270 capsule 0  . famotidine (PEPCID) 20 MG tablet Take 1 tablet (20 mg total) by mouth at bedtime. 270 tablet 0  . furosemide (LASIX) 40 MG tablet Take 1 tablet (40 mg total) by mouth daily as needed for fluid (for  weight gain in 24-hour period of > 5 pounds). 30 tablet 0  . gabapentin (NEURONTIN) 100 MG capsule Take 1 capsule (100 mg total) by mouth 4 (four) times daily. 720 capsule 0  . glucose blood test strip Use OneTouch Verio Flex test strips as instructed to check blood sugar twice daily. 200 each 3  . insulin detemir (LEVEMIR FLEXPEN) 100 UNIT/ML FlexPen Inject 24 Units into the skin at bedtime. (Patient taking differently: Inject 20-24 Units into the skin at bedtime. ) 45 mL 0  . insulin lispro (HUMALOG) 100 UNIT/ML cartridge Inject 10 units under the skin with breakfast and lunch and 6 units at evening meal 60 mL 0  . Insulin Pen Needle (PEN NEEDLES) 32G X 4 MM MISC Use as instructed to inject insulin 4 times daily.  800 each 0  . Insulin Pen Needle 32G X 4 MM MISC Use 3 per day 100 each 3  . latanoprost (XALATAN) 0.005 % ophthalmic solution Place 1 drop into both eyes at bedtime.    Marland Kitchen levocetirizine (XYZAL) 5 MG tablet Take 1 tablet (5 mg total) by mouth every evening. 90 tablet 3  . levothyroxine (SYNTHROID) 25 MCG tablet TAKE 1 TABLET BY MOUTH&nbsp;&nbsp;DAILY BEFORE BREAKFAST (Patient taking differently: TAKE 2 TABLET BY MOUTH  DAILY BEFORE BREAKFAST) 180 tablet 0  . melatonin 3 MG TABS tablet Take 2 tablets (6 mg total) by mouth at bedtime as needed (Sleep). 30 tablet 0  . metFORMIN (GLUCOPHAGE) 1000 MG tablet TAKE 1 TABLET BY MOUTH  TWICE DAILY WITH A MEAL 360 tablet 0  . metoprolol succinate (TOPROL-XL) 100 MG 24 hr tablet TAKE 1 TABLET BY MOUTH  DAILY WITH OR IMMEDIATELY  FOLLOWING A MEAL 90 tablet 3  .  montelukast (SINGULAIR) 10 MG tablet Take 1 tablet (10 mg total) by mouth at bedtime. 270 tablet 0  . Multiple Vitamins-Minerals (HAIR SKIN AND NAILS FORMULA PO) Take by mouth daily.    . Multiple Vitamins-Minerals (WOMENS 50+ MULTI VITAMIN/MIN) TABS Take 1 tablet by mouth daily.    . NONFORMULARY OR COMPOUNDED ITEM Lumbar support belt  #1  As directed 1 each 0  . nystatin ointment (MYCOSTATIN) APPLY TOPICALLY TWO TIMES  DAILY AS NEEDED 90 g 0  . OneTouch Delica Lancets 94W MISC 1 each by Does not apply route 2 (two) times daily. Use OneTouch Delica lancets to check blood sugar twice daily. 200 each 3  . OXYGEN Inhale 2 L into the lungs at bedtime.    . pantoprazole (PROTONIX) 40 MG tablet TAKE 1 TABLET BY MOUTH  DAILY 90 tablet 3  . polyethylene glycol powder (GLYCOLAX/MIRALAX) 17 GM/SCOOP powder Take 17 g by mouth 2 (two) times daily as needed for moderate constipation. 3350 g 1  . Probiotic Product (PROBIOTIC DAILY PO) Take 1 tablet by mouth daily.    . timolol (BETIMOL) 0.5 % ophthalmic solution Place 1 drop into both eyes 2 (two) times daily. (Patient taking differently: Place 1 drop into both eyes  daily. ) 30 mL 1  . vitamin E 400 UNIT capsule Take 400 Units by mouth daily.     No facility-administered medications prior to visit.     Review of Systems:   Constitutional:   No  weight loss, night sweats,  Fevers, chills,  +fatigue, or  lassitude.  HEENT:   No headaches,  Difficulty swallowing,  Tooth/dental problems, or  Sore throat,                No sneezing, itching, ear ache, nasal congestion, post nasal drip,   CV:  No chest pain,  Orthopnea, PND, swelling in lower extremities, anasarca, dizziness, palpitations, syncope.   GI  No heartburn, indigestion, abdominal pain, nausea, vomiting, diarrhea, change in bowel habits, loss of appetite, bloody stools.   Resp: .  No chest wall deformity  Skin: no rash or lesions.  GU: no dysuria, change in color of urine, no urgency or frequency.  No flank pain, no hematuria   MS:  No joint pain or swelling.  No decreased range of motion.  No back pain.    Physical Exam  BP 122/70 (BP Location: Left Arm, Cuff Size: Normal)   Pulse 90   Wt 122 lb (55.3 kg)   SpO2 94%   BMI 25.50 kg/m   GEN: A/Ox3; pleasant , NAD, thin elderly female   HEENT:  [place]/AT,   NOSE-clear, THROAT-clear, no lesions, no postnasal drip or exudate noted.   NECK:  Supple w/ fair ROM; no JVD; normal carotid impulses w/o bruits; no thyromegaly or nodules palpated; no lymphadenopathy.    RESP  Clear  P & A; w/o, wheezes/ rales/ or rhonchi. no accessory muscle use, no dullness to percussion  CARD:  RRR, no m/r/g, no peripheral edema, pulses intact, no cyanosis or clubbing.  GI:   Soft & nt; nml bowel sounds; no organomegaly or masses detected.   Musco: Warm bil, no deformities or joint swelling noted.   Neuro: alert, no focal deficits noted.    Skin: Warm, no lesions or rashes    Lab Results:  CBC  BMET   Imaging: No results found.    No flowsheet data found.  No results found for: NITRICOXIDE      Assessment &  Plan:   No  problem-specific Assessment & Plan notes found for this encounter.     Rexene Edison, NP 08/09/2019

## 2019-08-10 DIAGNOSIS — N390 Urinary tract infection, site not specified: Secondary | ICD-10-CM | POA: Diagnosis not present

## 2019-08-10 DIAGNOSIS — E1165 Type 2 diabetes mellitus with hyperglycemia: Secondary | ICD-10-CM | POA: Diagnosis not present

## 2019-08-10 DIAGNOSIS — N3000 Acute cystitis without hematuria: Secondary | ICD-10-CM | POA: Diagnosis not present

## 2019-08-10 DIAGNOSIS — G4733 Obstructive sleep apnea (adult) (pediatric): Secondary | ICD-10-CM | POA: Diagnosis not present

## 2019-08-10 DIAGNOSIS — E785 Hyperlipidemia, unspecified: Secondary | ICD-10-CM | POA: Diagnosis not present

## 2019-08-10 DIAGNOSIS — I272 Pulmonary hypertension, unspecified: Secondary | ICD-10-CM | POA: Diagnosis not present

## 2019-08-10 DIAGNOSIS — R6521 Severe sepsis with septic shock: Secondary | ICD-10-CM | POA: Diagnosis not present

## 2019-08-10 DIAGNOSIS — J9601 Acute respiratory failure with hypoxia: Secondary | ICD-10-CM | POA: Diagnosis not present

## 2019-08-10 DIAGNOSIS — E039 Hypothyroidism, unspecified: Secondary | ICD-10-CM | POA: Diagnosis not present

## 2019-08-10 DIAGNOSIS — I110 Hypertensive heart disease with heart failure: Secondary | ICD-10-CM | POA: Diagnosis not present

## 2019-08-10 DIAGNOSIS — N179 Acute kidney failure, unspecified: Secondary | ICD-10-CM | POA: Diagnosis not present

## 2019-08-10 DIAGNOSIS — I509 Heart failure, unspecified: Secondary | ICD-10-CM | POA: Diagnosis not present

## 2019-08-10 DIAGNOSIS — I248 Other forms of acute ischemic heart disease: Secondary | ICD-10-CM | POA: Diagnosis not present

## 2019-08-10 DIAGNOSIS — J9611 Chronic respiratory failure with hypoxia: Secondary | ICD-10-CM | POA: Insufficient documentation

## 2019-08-10 MED FILL — NITROFURANTOIN MCR 100 MG C: 100 | 5 days supply | Qty: 10 | Fill #0

## 2019-08-10 NOTE — Assessment & Plan Note (Signed)
Currently doing very well.  Continue on her current regimen and trigger prevention  Plan  Patient Instructions  Continue on current regimen  Continue on Oxygen 1.5 l/m At bedtime   Call back if you change mind on sleep study or can look at when you return home .  Follow up with Dr. Melvyn Novas  In 6 months (when you return from [place]) .

## 2019-08-10 NOTE — Assessment & Plan Note (Signed)
Chronic respiratory failure on nocturnal oxygen.  Recent echo showed pulmonary hypertension.  There is some suspicion from recent hospitalization in April during critical illness that she could have a component of obstructive sleep apnea as she has some witnessed apneic events.  Long discussion with patient and family members.  Currently patient will continue on oxygen at 1.5 L at bedtime.  On return she will decide if she wants to proceed with a sleep evaluation.  Patient will be out of the country in [place] with her family for the next 6 months.  Patient was advised that if symptoms worsen or anything changes she should seek emergency or medical care in [place] .  Plan  Patient Instructions  Continue on current regimen  Continue on Oxygen 1.5 l/m At bedtime   Call back if you change mind on sleep study or can look at when you return home .  Follow up with Dr. Melvyn Novas  In 6 months (when you return from [place]) .

## 2019-08-10 NOTE — Assessment & Plan Note (Signed)
Acute respiratory failure with hypoxia: She seems to be better, on nocturnal O2.  Physical therapy visits her regularly and O2 sats even with exertion are> 90% DM: Saw endocrinology June 15, they noted increase insulin requirements since her recent sepsis and also difficulty following instruction, occasionally misses a insulin shot. Hypothyroidism: Per Endo  HTN: BP today improved, recommend good compliance with all meds including Lasix, metoprolol  Abdominal pain: Much improved, last CBC stable, I fob pending.  She is on probiotics and MiraLAX daily and feels that that is helping, had some constipation which is resolved. ESBL E. coli bacteremia: See previous visit comments, no UTI symptoms. Travel plans: Request a letter stating that is safe for her to fly to [place], the daughter the patient feels confident that she is able to tolerate.  She indeed looks stronger today.  Letter provided. Multiple refills provided. RTC in few months when she is back from [place].

## 2019-08-10 NOTE — Assessment & Plan Note (Signed)
Recent 2D echo showed pulmonary hypertension -questionable etiology.  Could have a component of sleep apnea.  Currently will decline sleep evaluation as she is leaving to go to [place].  However on return will decide if she would like to proceed with a sleep evaluation.  For now continue on nocturnal oxygen at 1.5 L And Lasix.  Plan  Patient Instructions  Continue on current regimen  Continue on Oxygen 1.5 l/m At bedtime   Call back if you change mind on sleep study or can look at when you return home .  Follow up with Dr. Melvyn Novas  In 6 months (when you return from [place]) .      Mary Flynn

## 2019-08-11 ENCOUNTER — Other Ambulatory Visit (INDEPENDENT_AMBULATORY_CARE_PROVIDER_SITE_OTHER): Payer: Medicare Other

## 2019-08-11 ENCOUNTER — Ambulatory Visit: Payer: Medicare Other | Admitting: Adult Health

## 2019-08-11 ENCOUNTER — Telehealth: Payer: Self-pay | Admitting: *Deleted

## 2019-08-11 DIAGNOSIS — R1013 Epigastric pain: Secondary | ICD-10-CM | POA: Diagnosis not present

## 2019-08-11 LAB — FECAL OCCULT BLOOD, IMMUNOCHEMICAL: Fecal Occult Bld: NEGATIVE

## 2019-08-11 NOTE — Telephone Encounter (Signed)
Received call from [facility] that IFOB orders needed to be changed to future so they can release result. Test re-ordered.

## 2019-08-11 NOTE — Addendum Note (Signed)
Addended by: Kelle Darting A on: 08/11/2019 03:18 PM   Modules accepted: Orders

## 2019-08-12 DIAGNOSIS — I272 Pulmonary hypertension, unspecified: Secondary | ICD-10-CM | POA: Diagnosis not present

## 2019-08-12 DIAGNOSIS — I509 Heart failure, unspecified: Secondary | ICD-10-CM | POA: Diagnosis not present

## 2019-08-15 MED FILL — DULoxetine HCL 30 MG CPEP: 30 | 180 days supply | Qty: 180 | Fill #0

## 2019-08-15 MED FILL — FAMOTIDINE 20 MG TABS: 20 | 180 days supply | Qty: 180 | Fill #0

## 2019-08-15 MED FILL — LEVOCETIRIZINE 5 MG TABLET: 5 | 90 days supply | Qty: 90 | Fill #0

## 2019-08-15 MED FILL — GABAPENTIN 100 MG CAPSULE: 100 | 180 days supply | Qty: 720 | Fill #0

## 2019-08-16 ENCOUNTER — Ambulatory Visit: Payer: Medicare Other | Attending: Internal Medicine

## 2019-08-16 DIAGNOSIS — Z20822 Contact with and (suspected) exposure to covid-19: Secondary | ICD-10-CM

## 2019-08-17 LAB — SARS-COV-2, NAA 2 DAY TAT

## 2019-08-17 LAB — NOVEL CORONAVIRUS, NAA: SARS-CoV-2, NAA: NOT DETECTED

## 2019-08-22 IMAGING — MG DIGITAL SCREENING BILATERAL MAMMOGRAM WITH TOMO AND CAD
8 series · 8 of 24 positions shown · non-contrast
Comparison: Previous exam(s).

CLINICAL DATA: Screening.

EXAM:
DIGITAL SCREENING BILATERAL MAMMOGRAM WITH TOMO AND CAD

[L MLO synth-2D]
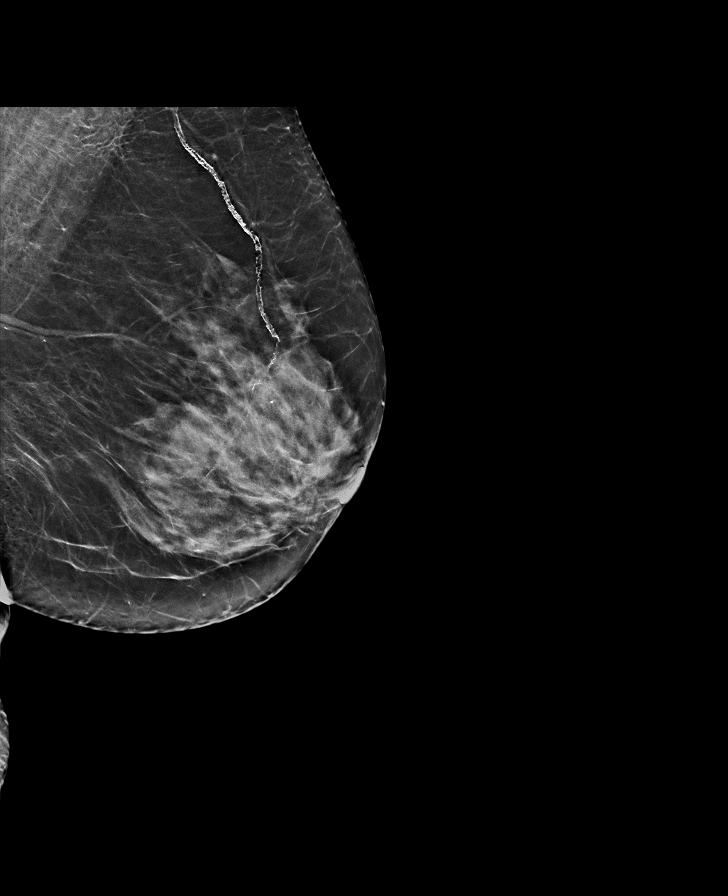

[R MLO synth-2D]
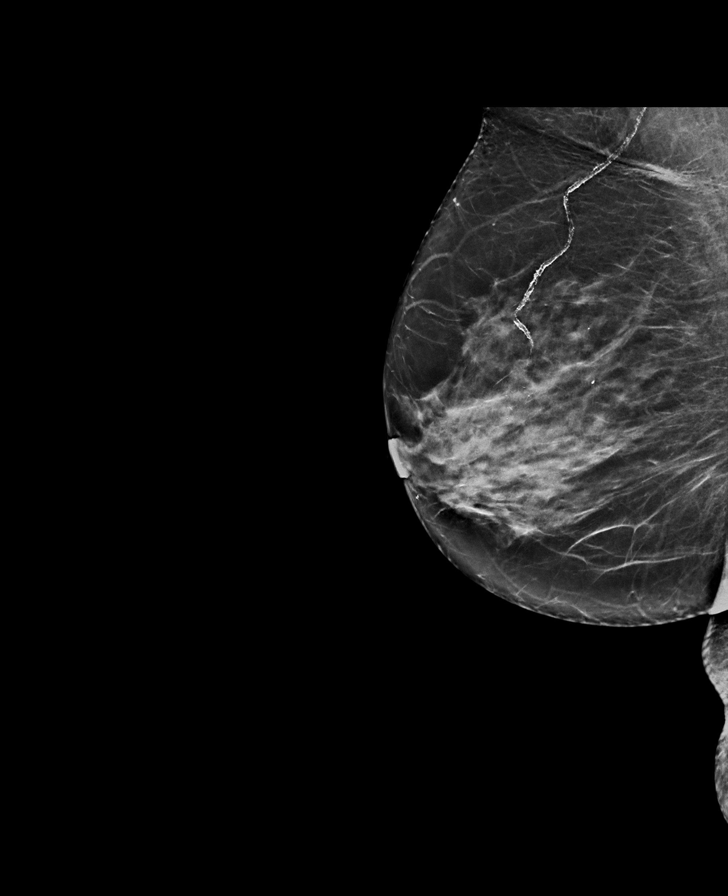

[L CC synth-2D]
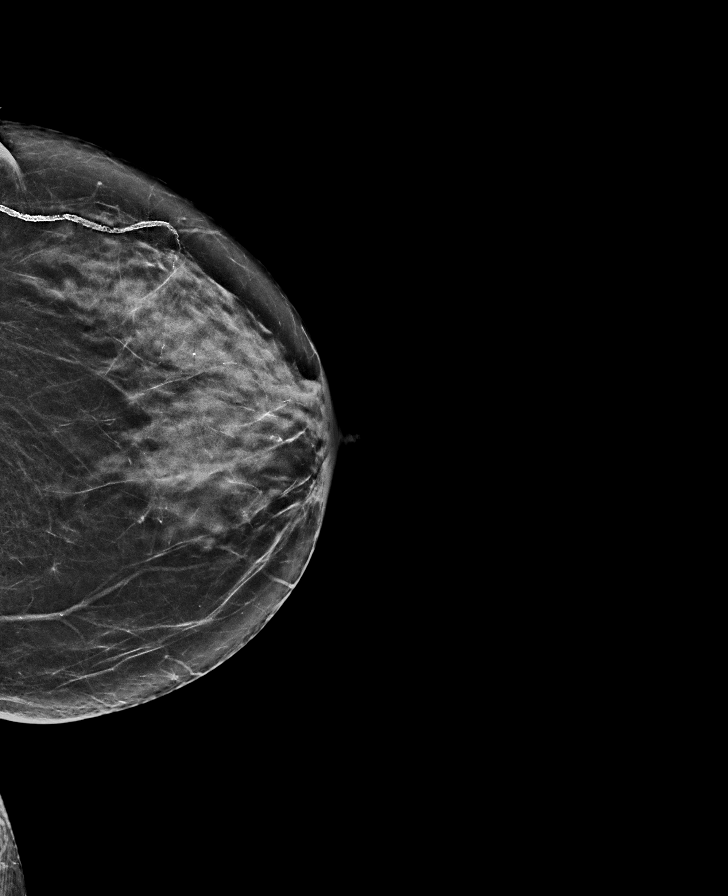

[R CC synth-2D]
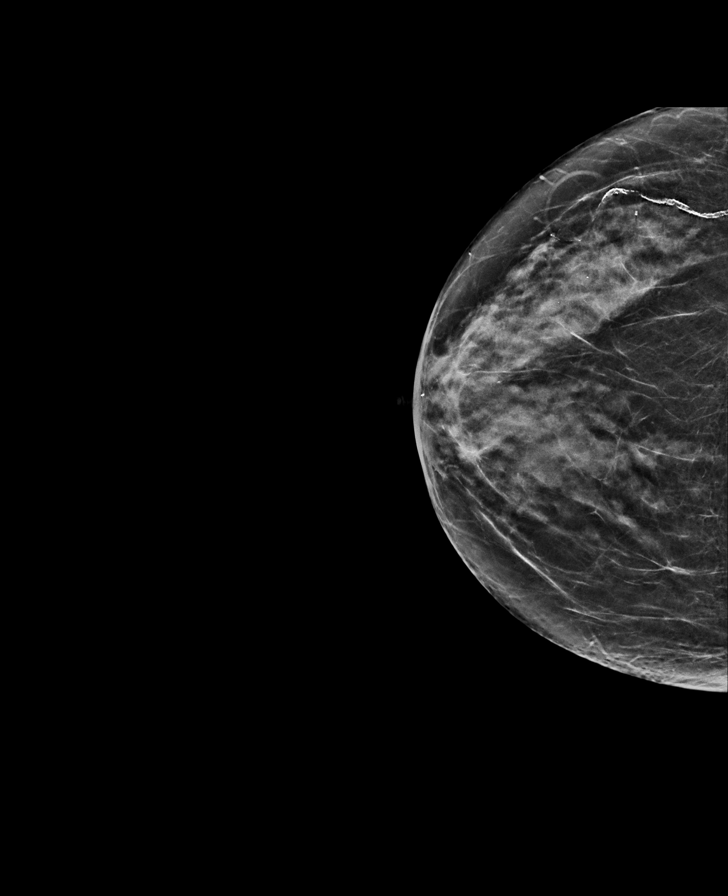

[L CC tomo · tomo slice 31/62.0]
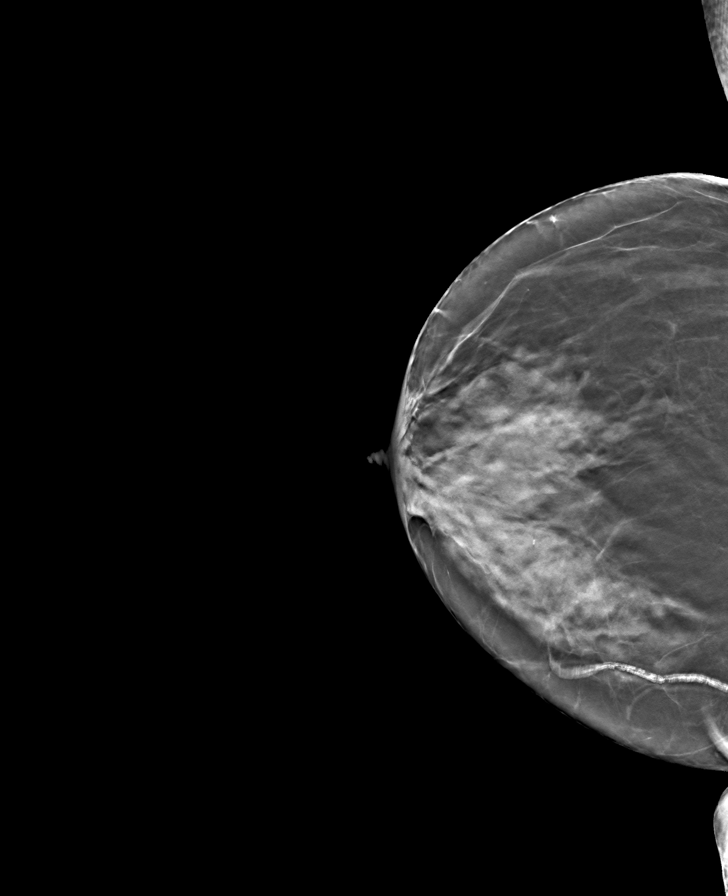

[L MLO tomo · tomo slice 35/68.0]
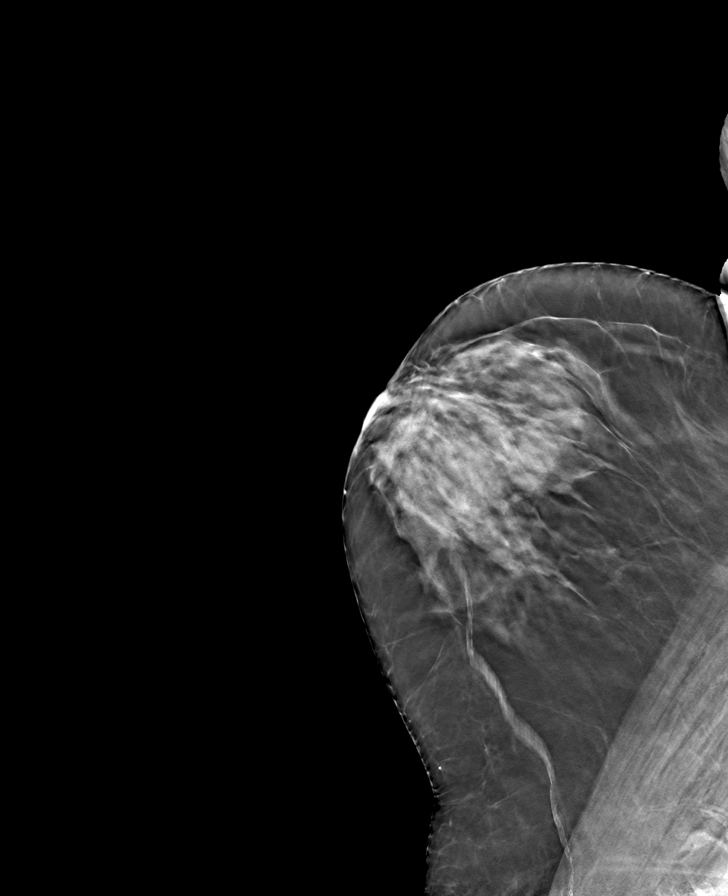

[R CC tomo · tomo slice 29/58.0]
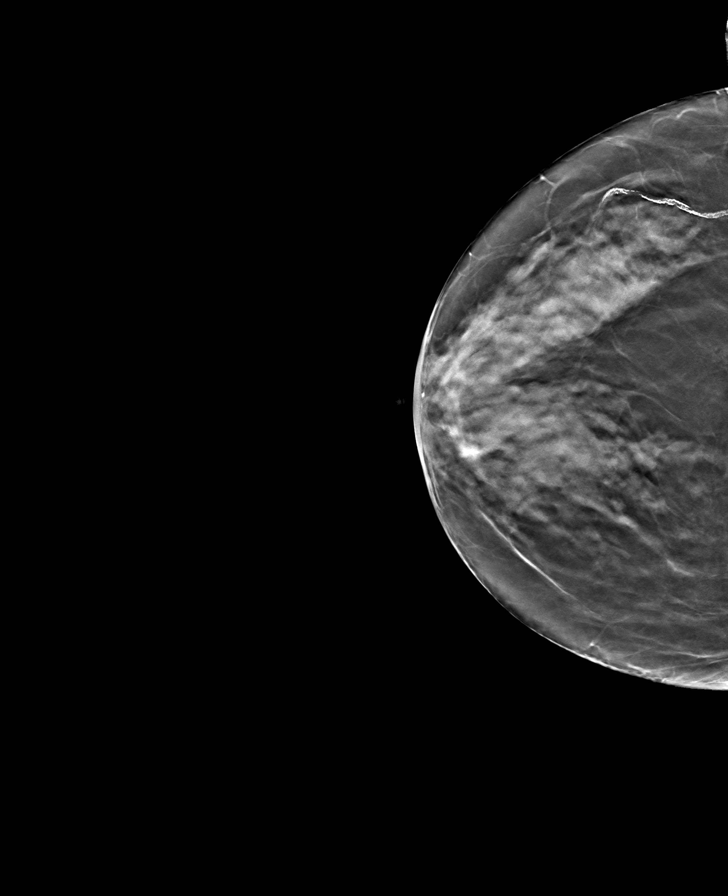

[R MLO tomo · tomo slice 35/69.0]
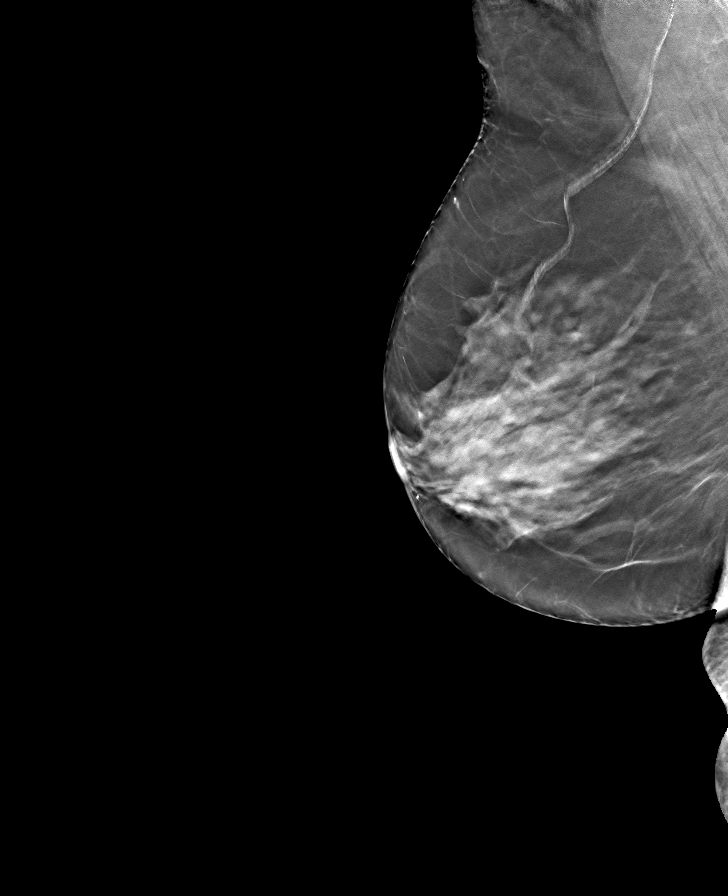

[8 of 24 positions shown; findings below may reference images not displayed]

ACR Breast Density Category c: The breast tissue is heterogeneously
dense, which may obscure small masses.
FINDINGS: There are no findings suspicious for malignancy. Images were
processed with CAD.
IMPRESSION: No mammographic evidence of malignancy. A result letter of this
screening mammogram will be mailed directly to the patient.

RECOMMENDATION:
Screening mammogram in one year. (Code:[id number]-U-LHB)

BI-RADS CATEGORY  1: Negative.

## 2019-08-23 ENCOUNTER — Encounter: Payer: Self-pay | Admitting: Internal Medicine

## 2019-08-25 ENCOUNTER — Telehealth: Payer: Self-pay | Admitting: Internal Medicine

## 2019-08-25 NOTE — Progress Notes (Signed)
  Chronic Care Management   Outreach Note  08/25/2019 Name: Mary Flynn MRN: [id number] DOB: 1935/02/09  Referred by: Colon Branch, MD Reason for referral : Chronic Care Management (Initial CCM Outreach)   A second unsuccessful telephone outreach was attempted today. The patient was referred to pharmacist for assistance with care management and care coordination.  Follow Up Plan:   [place]

## 2019-08-28 ENCOUNTER — Other Ambulatory Visit: Payer: Self-pay | Admitting: Family

## 2019-09-01 ENCOUNTER — Other Ambulatory Visit: Payer: Self-pay

## 2019-09-01 ENCOUNTER — Telehealth: Payer: Medicare Other

## 2019-09-01 ENCOUNTER — Ambulatory Visit: Payer: Medicare Other

## 2019-09-01 NOTE — Chronic Care Management (AMB) (Deleted)
Chronic Care Management Pharmacy  Name: Mary Flynn  MRN: [id number] DOB: 24-Dec-1934   Initially called to conduct appt for patient's husband. Pt's daughter answered the phone and asked to reschedule Mary Flynn's appt, but had a concern regarding Mary Flynn. She wondered if the patient is to continue taking metoprolol. Upon brief review of patient's chart, it appears she was D/C on metoprolol succinate '100mg'$  daily and started on metoprolol tartrate '25mg'$  twice daily during the 4/6-4/19 admission. Received consent for CCM services for Mary Flynn, agreed to look into this further, and call pt's daughter back to conduct CCM visit for patient.   Pt's daughter, Mary Flynn helps manage care for Mary Flynn.   Chief Complaint/ HPI  Mary Flynn,  85 y.o. , female presents for their {Initial/Follow-up:3041532} CCM visit with the clinical pharmacist {CHL HP Upstream Pharm visit OZDG:[id number]}.  PCP : Mary Branch, MD  Their chronic conditions include: {CHL AMB CHRONIC MEDICAL [phone]}  Office Visits: 08/08/19: Visit w/ Mary Flynn - Lasix and metoprolol listed for HTN.   07/12/19: Visit w/ Mary Flynn - Epigastric pain - check cbc and FOB if GI bleed evidence refer to GI. Ok to stop Mg. (metoprolol tartrate '25mg'$  listed)  06/23/19: Visit w/ Mary Flynn [facility] Follow up and Transfer of Care from Mary Alar, NP to Mary Flynn. No made changes noted other than those from most recent Flynn discharge. 3 more days of macrobid prescribed after culture resulted  06/22/19: Received urinalysis from Mary Flynn. Showed UTI. Prescribed macrobid BID until cultures return  Consult Visit: 08/09/19: Pulm visit w/ Mary Flynn - Continue current regimen. Continue 1.5L of oxygen at bedtime. Follow up in 6 months when pt returns from [place]  08/08/19: Endo visit w/ Mary Flynn - Levemir 22 units, Humalog 10 units breakfast, 8 units lunch, 4 units dinner. Metformin '1000mg'$  BID and jardiance '10mg'$  daily. BGs  improved. Levemir 20 units. Only increase if FBC >140. If FBG <90 decrease by 2 units. Discussed how to adjust Humalog. Continue metformin, but stop Jardiance. Consider restart Jardiance in 3 months if no recurrent UTI.  07/17/19: Endo visit w/ Mary Flynn - Insulin regimen. Levemir 24 units HS, Humalog 10 units Breakfast, 10-12 units Lunch, 7-8 units dinner. Pt forgot recent Levemir increase. Still taking Jardiance although recommended to wait until UTI cleared.  Reduce levemir by 2 units (levemir 22 units daily at supper vs bedtime for increased adherence) Can reduce to 20 units if AM BG <100. Hold jardiance until UTI resolved Humalog reduced to 8 units for lunch and 4 units with dinner. Continue 10 units with breakfast Increase levothyroxine to 16mg noting higher TSH  Stopped Meds glimepriride '4mg'$  levocetirizine '5mg'$  Metoprolol '25mg'$  (due to patient not taking vs decision to stop? Metoprolol succinate '100mg'$  listed in medications to take) Nitrofurantoin '100mg'$     07/06/19: Pulmonary visit w/ Mary Flynn- Gabapentin '100mg'$  QID. Call if cough not improving. Follow up PRN.  06/21/19: Endo visit w/ Mary Flynn- BG 376 in office. Increase to metformin '1000mg'$  BID. Restart invokana or jardiance although consider holding temporarily if recurrence of UTI. Complaint of malodorous urine. Urinalysis sent to PCP. Increase levemir to 24 units until AM BG <140 then reduce to 20 when AM BG <90. Invokana '100mg'$  AM  ED Visit:  4/6 - 06/12/19: Mary Flynn Discharge Summary: Stopped Meds Amitriptyline '25mg'$   Amlodipine '5mg'$  cipro '250mg'$  Fluconazole '150mg'$  Glimepiride '4mg'$  Jardiance '10mg'$  Metoprolol succinate '100mg'$  Niacin '500mg'$  Nitrofurantoin '100mg'$   Take Meds Albuterol 2 puffs Q6H PRN SOB Aspirin '81mg'$  QD B complex QD Cyclobenzaprine '10mg'$  BID PRN Duloxetine '30mg'$  QD Famotidine '20mg'$  HS Fluticasone 110/mcg 2 puffs BID Fluticasone 51mg 2 spray QD Furosemide '40mg'$  QD PRN wt gain >5lbs in 24  hrs Gabapentin '100mg'$  BID Humalog 8 units lunch, 4 units evening meal Levemir 20 units HS Latanoprost 0.005% 1 drop OU QD Levocetirizine '5mg'$  PM Levothyroxine 232m breakfast Mag Ox '400mg'$  2 tabs BID Melatonin '3mg'$  2 tabs HS Metformin '500mg'$  BID Metoprolol tartrate '25mg'$  BID Montelukast '10mg'$  HS Pantoprazole '40mg'$  QD Timolol 0.5% 1 drop OU BID Triamcinolone 0.1% paste BID  Vitamin E 400 units QD  Medications: Outpatient Encounter Medications as of 09/01/2019  Medication Sig  . albuterol (VENTOLIN HFA) 108 (90 Base) MCG/ACT inhaler Inhale 2 puffs into the lungs every 6 (six) hours as needed for wheezing or shortness of breath.  . Ascorbic Acid (VITAMIN C) 500 MG CAPS Take 1 tablet by mouth daily.  . Marland Kitchenspirin EC 81 MG tablet Take 81 mg by mouth daily.  . Marland Kitchentorvastatin (LIPITOR) 10 MG tablet Take 1 tablet (10 mg total) by mouth daily.  . Marland Kitchen complex vitamins tablet Take 1 tablet by mouth daily.  . Blood Glucose Monitoring Suppl (ONETOUCH VERIO FLEX SYSTEM) w/Device KIT 1 each by Does not apply route 2 (two) times daily. Use Onetouch verio flex to check blood sugar twice daily.  . DULoxetine (CYMBALTA) 30 MG capsule Take 1 capsule (30 mg total) by mouth daily.  . famotidine (PEPCID) 20 MG tablet Take 1 tablet (20 mg total) by mouth at bedtime.  . furosemide (LASIX) 40 MG tablet Take 1 tablet (40 mg total) by mouth daily as needed for fluid (for  weight gain in 24-hour period of > 5 pounds).  . gabapentin (NEURONTIN) 100 MG capsule Take 1 capsule (100 mg total) by mouth 4 (four) times daily.  . Marland Kitchenlucose blood test strip Use OneTouch Verio Flex test strips as instructed to check blood sugar twice daily.  . insulin detemir (LEVEMIR FLEXPEN) 100 UNIT/ML FlexPen Inject 24 Units into the skin at bedtime. (Patient taking differently: Inject 20-24 Units into the skin at bedtime. )  . insulin lispro (HUMALOG) 100 UNIT/ML cartridge Inject 10 units under the skin with breakfast and lunch and 6 units at evening  meal  . Insulin Pen Needle (PEN NEEDLES) 32G X 4 MM MISC Use as instructed to inject insulin 4 times daily.  . Insulin Pen Needle 32G X 4 MM MISC Use 3 per [place] Shannahan  . latanoprost (XALATAN) 0.005 % ophthalmic solution Place 1 drop into both eyes at bedtime.  . Marland Kitchenevocetirizine (XYZAL) 5 MG tablet Take 1 tablet (5 mg total) by mouth every evening.  . Marland Kitchenevothyroxine (SYNTHROID) 25 MCG tablet TAKE 1 TABLET BY MOUTH&nbsp;&nbsp;DAILY BEFORE BREAKFAST (Patient taking differently: TAKE 2 TABLET BY MOUTH  DAILY BEFORE BREAKFAST)  . melatonin 3 MG TABS tablet Take 2 tablets (6 mg total) by mouth at bedtime as needed (Sleep).  . metFORMIN (GLUCOPHAGE) 1000 MG tablet TAKE 1 TABLET BY MOUTH  TWICE DAILY WITH A MEAL  . metoprolol succinate (TOPROL-XL) 100 MG 24 hr tablet TAKE 1 TABLET BY MOUTH  DAILY WITH OR IMMEDIATELY  FOLLOWING A MEAL  . montelukast (SINGULAIR) 10 MG tablet Take 1 tablet (10 mg total) by mouth at bedtime.  . Multiple Vitamins-Minerals (HAIR SKIN AND NAILS FORMULA PO) Take by mouth daily.  . Multiple Vitamins-Minerals (WOMENS 50+ MULTI VITAMIN/MIN) TABS Take 1 tablet by mouth daily.  .Marland Kitchen  NONFORMULARY OR COMPOUNDED ITEM Lumbar support belt  #1  As directed  . nystatin ointment (MYCOSTATIN) APPLY TOPICALLY TWO TIMES  DAILY AS NEEDED  . OneTouch Delica Lancets 16X MISC 1 each by Does not apply route 2 (two) times daily. Use OneTouch Delica lancets to check blood sugar twice daily.  . OXYGEN Inhale 2 L into the lungs at bedtime.  . pantoprazole (PROTONIX) 40 MG tablet TAKE 1 TABLET BY MOUTH  DAILY  . polyethylene glycol powder (GLYCOLAX/MIRALAX) 17 GM/SCOOP powder Take 17 g by mouth 2 (two) times daily as needed for moderate constipation.  . Probiotic Product (PROBIOTIC DAILY PO) Take 1 tablet by mouth daily.  . timolol (BETIMOL) 0.5 % ophthalmic solution Place 1 drop into both eyes 2 (two) times daily. (Patient taking differently: Place 1 drop into both eyes daily. )  . vitamin E 400 UNIT capsule Take  400 Units by mouth daily.   No facility-administered encounter medications on file as of 09/01/2019.     Current Diagnosis/Assessment:  Goals Addressed   None    Significant time spent reviewing patient's chart to clarify metoprolol dosing. Will complete initial CCM visit in the future.   Diabetes   A1c goal <7% FBG 90-130 (per endo) PPBG 130-180 (per endo)  Recent Relevant Labs: Lab Results  Component Value Date/Time   HGBA1C 8.2 (H) 05/30/2019 01:43 PM   HGBA1C 7.4 (H) 03/15/2019 08:24 AM   MICROALBUR 14.7 (H) 06/21/2019 11:20 AM   MICROALBUR 3.8 (H) 07/08/2018 08:14 AM     Checking BG: {CHL HP Blood Glucose Monitoring [phone]}  Recent FBG Readings: Recent pre-meal BG readings: *** Recent 2hr PP BG readings:  *** Recent HS BG readings: *** Patient has failed these meds in past: *** Patient is currently {CHL Controlled/[phone]} on the following medications: ***  Last diabetic Foot exam:  Lab Results  Component Value Date/Time   HMDIABEYEEXA No Retinopathy 02/09/2019 12:00 AM    Last diabetic Eye exam: No results found for: HMDIABFOOTEX   We discussed: {CHL HP Upstream Pharmacy discussion:[id number]}  Plan  Continue {CHL HP Upstream Pharmacy Plans:[phone]}   Hypertension   BP Goal <140/90  Office blood pressures are  BP Readings from Last 3 Encounters:  08/09/19 122/70  08/08/19 122/70  08/08/19 (!) 148/84   CMP Latest Ref Rng & Units 08/01/2019 06/21/2019 06/12/2019  Glucose 70 - 99 mg/dL 106(H) 354(H) 117(H)  BUN 6 - 23 mg/dL 12 14 30(H)  Creatinine 0.40 - 1.20 mg/dL 0.81 0.85 0.87  Sodium 135 - 145 mEq/L 135 132(L) 136  Potassium 3.5 - 5.1 mEq/L 4.0 4.1 4.3  Chloride 96 - 112 mEq/L 99 96 96(L)  CO2 19 - 32 mEq/L '28 27 27  '$ Calcium 8.4 - 10.5 mg/dL 9.5 9.2 9.4  Total Protein 6.0 - 8.3 g/dL 7.6 7.2 6.6  Total Bilirubin 0.2 - 1.2 mg/dL 1.0 0.9 0.9  Alkaline Phos 39 - 117 U/L 62 92 78  AST 0 - 37 U/L 15 13 32  ALT 0 -  35 U/L '12 15 29   '$ Kidney Function Lab Results  Component Value Date/Time   CREATININE 0.81 08/01/2019 09:09 AM   CREATININE 0.85 06/21/2019 11:20 AM   CREATININE 0.62 06/02/2011 01:28 PM   GFR 67.56 08/01/2019 09:09 AM   GFRNONAA >60 06/12/2019 06:32 AM   GFRNONAA >60 06/02/2011 01:28 PM   GFRAA >60 06/12/2019 06:32 AM   GFRAA >60 06/02/2011 01:28 PM   K 4.0 08/01/2019 09:09 AM   K 4.1 06/21/2019  11:20 AM   K 4.1 06/02/2011 01:28 PM   Patient has failed these meds in the past: *** Patient is currently {CHL Controlled/Uncontrolled:[phone]} on the following medications: ***   Patient's daughter wonders if patient is to continue metoprolol. Unclear if metoprolol tartrate was intentionally D/C by Mary Flynn and metoprolol succinate restarted. Will coordinate with Mary Flynn to clarify in order to provide information to patient and patient's daughter.   Patient checks BP at home {CHL HP BP Monitoring Frequency:[phone]}  Patient home BP readings are ranging: ***  We discussed {CHL HP Upstream Pharmacy discussion:[phone]}  Plan  Continue {CHL HP Upstream Pharmacy [phone]}     Hyperlipidemia   LDL goal < ***  Lipid Panel     Component Value Date/Time   CHOL 148 06/10/2019 0400   TRIG 198 (H) 06/10/2019 0400   HDL 32 (L) 06/10/2019 0400   LDLCALC 76 06/10/2019 0400    Hepatic Function Latest Ref Rng & Units 08/01/2019 06/21/2019 06/12/2019  Total Protein 6.0 - 8.3 g/dL 7.6 7.2 6.6  Albumin 3.5 - 5.2 g/dL 4.1 4.0 3.1(L)  AST 0 - 37 U/L 15 13 32  ALT 0 - 35 U/L '12 15 29  '$ Alk Phosphatase 39 - 117 U/L 62 92 78  Total Bilirubin 0.2 - 1.2 mg/dL 1.0 0.9 0.9  Bilirubin, Direct 0.0 - 0.3 mg/dL - - -     The ASCVD Risk score ([place]., et al., 2013) failed to calculate for the following reasons:   The 2013 ASCVD risk score is only valid for ages 22 to 60   The patient has a prior MI or stroke diagnosis   Patient has failed these meds in past: *** Patient is  currently {CHL Controlled/Uncontrolled:[phone]} on the following medications:  . ***  We discussed:  {CHL HP Upstream Pharmacy discussion:[phone]}  Plan  Continue {CHL HP Upstream Pharmacy QIHKV:[id number]}

## 2019-09-01 NOTE — Chronic Care Management (AMB) (Signed)
Chronic Care Management Pharmacy  Name: Mary Flynn  MRN: [id number] DOB: October 13, 1934   Initially called to conduct appt for patient's husband. Pt's daughter answered the phone and asked to reschedule Mr. Flynn's appt, but had a concern regarding Mary Flynn. She wondered if the patient is to continue taking metoprolol. Upon brief review of patient's chart, it appears she was D/C on metoprolol succinate '100mg'$  daily and started on metoprolol tartrate '25mg'$  twice daily during the 4/6-4/19 admission. Received consent for CCM services for Mary Flynn, agreed to look into this further, and call pt's daughter back to conduct CCM visit for patient.   Pt's daughter, Mary Flynn helps manage care for Mr. And Mary Flynn.   Chief Complaint/ HPI  Mary Flynn,  85 y.o. , female presents for their Initial CCM visit with the clinical pharmacist via telephone due to COVID-19 Pandemic.  PCP : Colon Branch, MD  Office Visits: 08/08/19: Visit w/ Dr. Larose Kells - Lasix and metoprolol listed for HTN.   07/12/19: Visit w/ Dr. Larose Kells - Epigastric pain - check cbc and FOB if GI bleed evidence refer to GI. Ok to stop Mg. (metoprolol tartrate '25mg'$  listed)  06/23/19: Visit w/ Dr. Larose Kells [facility] Follow up and Transfer of Care from [name], NP to Dr. Larose Kells. No made changes noted other than those from most recent hospital discharge. 3 more days of macrobid prescribed after culture resulted  06/22/19: Received urinalysis from Dr. Dwyane Dee. Showed UTI. Prescribed macrobid BID until cultures return  Consult Visit: 08/09/19: Pulm visit w/ Tammy Parrett - Continue current regimen. Continue 1.5L of oxygen at bedtime. Follow up in 6 months when pt returns from [place]  08/08/19: Endo visit w/ Dr. Dwyane Dee - Levemir 22 units, Humalog 10 units breakfast, 8 units lunch, 4 units dinner. Metformin '1000mg'$  BID and jardiance '10mg'$  daily. BGs improved. Levemir 20 units. Only increase if FBC >140. If FBG <90 decrease by 2 units. Discussed how to adjust Humalog.  Continue metformin, but stop Jardiance. Consider restart Jardiance in 3 months if no recurrent UTI.  07/17/19: Endo visit w/ Dr. Dwyane Dee - Insulin regimen. Levemir 24 units HS, Humalog 10 units Breakfast, 10-12 units Lunch, 7-8 units dinner. Pt forgot recent Levemir increase. Still taking Jardiance although recommended to wait until UTI cleared.  Reduce levemir by 2 units (levemir 22 units daily at supper vs bedtime for increased adherence) Can reduce to 20 units if AM BG <100. Hold jardiance until UTI resolved Humalog reduced to 8 units for lunch and 4 units with dinner. Continue 10 units with breakfast Increase levothyroxine to 70mg noting higher TSH  Stopped Meds glimepriride '4mg'$  levocetirizine '5mg'$  Metoprolol '25mg'$  (due to patient not taking vs decision to stop? Metoprolol succinate '100mg'$  listed in medications to take) Nitrofurantoin '100mg'$   07/06/19: Pulmonary visit w/ Dr. WMelvyn Novas- Gabapentin '100mg'$  QID. Call if cough not improving. Follow up PRN.  06/21/19: Endo visit w/ Dr. KDwyane Dee- BG 376 in office. Increase to metformin '1000mg'$  BID. Restart invokana or jardiance although consider holding temporarily if recurrence of UTI. Complaint of malodorous urine. Urinalysis sent to PCP. Increase levemir to 24 units until AM BG <140 then reduce to 20 when AM BG <90. Invokana '100mg'$  AM  ED Visit:  [facility]: Stopped Meds Amitriptyline '25mg'$   Amlodipine '5mg'$  cipro '250mg'$  Fluconazole '150mg'$  Glimepiride '4mg'$  Jardiance '10mg'$  Metoprolol succinate '100mg'$  Niacin '500mg'$  Nitrofurantoin '100mg'$   Take Meds Albuterol 2 puffs Q6H PRN SOB Aspirin '81mg'$  QD  B complex QD Cyclobenzaprine '10mg'$  BID PRN Duloxetine '30mg'$  QD Famotidine '20mg'$  HS Fluticasone 110/mcg 2 puffs BID Fluticasone 32mg 2 spray QD Furosemide '40mg'$  QD PRN wt gain >5lbs in 24 hrs Gabapentin '100mg'$  BID Humalog 8 units lunch, 4 units evening meal Levemir 20 units HS Latanoprost 0.005% 1 drop OU  QD Levocetirizine '5mg'$  PM Levothyroxine 229m breakfast Mag Ox '400mg'$  2 tabs BID Melatonin '3mg'$  2 tabs HS Metformin '500mg'$  BID Metoprolol tartrate '25mg'$  BID Montelukast '10mg'$  HS Pantoprazole '40mg'$  QD Timolol 0.5% 1 drop OU BID Triamcinolone 0.1% paste BID  Vitamin E 400 units QD  Medications: Outpatient Encounter Medications as of 09/01/2019  Medication Sig  . albuterol (VENTOLIN HFA) 108 (90 Base) MCG/ACT inhaler Inhale 2 puffs into the lungs every 6 (six) hours as needed for wheezing or shortness of breath.  . Ascorbic Acid (VITAMIN C) 500 MG CAPS Take 1 tablet by mouth daily.  . Marland Kitchenspirin EC 81 MG tablet Take 81 mg by mouth daily.  . Marland Kitchentorvastatin (LIPITOR) 10 MG tablet Take 1 tablet (10 mg total) by mouth daily.  . Marland Kitchen complex vitamins tablet Take 1 tablet by mouth daily.  . Blood Glucose Monitoring Suppl (ONETOUCH VERIO FLEX SYSTEM) w/Device KIT 1 each by Does not apply route 2 (two) times daily. Use Onetouch verio flex to check blood sugar twice daily.  . DULoxetine (CYMBALTA) 30 MG capsule Take 1 capsule (30 mg total) by mouth daily.  . famotidine (PEPCID) 20 MG tablet Take 1 tablet (20 mg total) by mouth at bedtime.  . furosemide (LASIX) 40 MG tablet Take 1 tablet (40 mg total) by mouth daily as needed for fluid (for  weight gain in 24-hour period of > 5 pounds).  . gabapentin (NEURONTIN) 100 MG capsule Take 1 capsule (100 mg total) by mouth 4 (four) times daily.  . Marland Kitchenlucose blood test strip Use OneTouch Verio Flex test strips as instructed to check blood sugar twice daily.  . insulin detemir (LEVEMIR FLEXPEN) 100 UNIT/ML FlexPen Inject 24 Units into the skin at bedtime. (Patient taking differently: Inject 20-24 Units into the skin at bedtime. )  . insulin lispro (HUMALOG) 100 UNIT/ML cartridge Inject 10 units under the skin with breakfast and lunch and 6 units at evening meal  . Insulin Pen Needle (PEN NEEDLES) 32G X 4 MM MISC Use as instructed to inject insulin 4 times daily.  . Insulin Pen  Needle 32G X 4 MM MISC Use 3 per Adryana Mogensen  . latanoprost (XALATAN) 0.005 % ophthalmic solution Place 1 drop into both eyes at bedtime.  . Marland Kitchenevocetirizine (XYZAL) 5 MG tablet Take 1 tablet (5 mg total) by mouth every evening.  . Marland Kitchenevothyroxine (SYNTHROID) 25 MCG tablet TAKE 1 TABLET BY MOUTH  DAILY BEFORE BREAKFAST (Patient taking differently: TAKE 2 TABLET BY MOUTH  DAILY BEFORE BREAKFAST)  . melatonin 3 MG TABS tablet Take 2 tablets (6 mg total) by mouth at bedtime as needed (Sleep).  . metFORMIN (GLUCOPHAGE) 1000 MG tablet TAKE 1 TABLET BY MOUTH  TWICE DAILY WITH A MEAL  . metoprolol succinate (TOPROL-XL) 100 MG 24 hr tablet TAKE 1 TABLET BY MOUTH  DAILY WITH OR IMMEDIATELY  FOLLOWING A MEAL  . montelukast (SINGULAIR) 10 MG tablet Take 1 tablet (10 mg total) by mouth at bedtime.  . Multiple Vitamins-Minerals (HAIR SKIN AND NAILS FORMULA PO) Take by mouth daily.  . Multiple Vitamins-Minerals (WOMENS 50+ MULTI VITAMIN/MIN) TABS Take 1 tablet by mouth daily.  . NONFORMULARY OR COMPOUNDED ITEM Lumbar support belt  #  1  As directed  . nystatin ointment (MYCOSTATIN) APPLY TOPICALLY TWO TIMES  DAILY AS NEEDED  . OneTouch Delica Lancets 16X MISC 1 each by Does not apply route 2 (two) times daily. Use OneTouch Delica lancets to check blood sugar twice daily.  . OXYGEN Inhale 2 L into the lungs at bedtime.  . pantoprazole (PROTONIX) 40 MG tablet TAKE 1 TABLET BY MOUTH  DAILY  . polyethylene glycol powder (GLYCOLAX/MIRALAX) 17 GM/SCOOP powder Take 17 g by mouth 2 (two) times daily as needed for moderate constipation.  . Probiotic Product (PROBIOTIC DAILY PO) Take 1 tablet by mouth daily.  . timolol (BETIMOL) 0.5 % ophthalmic solution Place 1 drop into both eyes 2 (two) times daily. (Patient taking differently: Place 1 drop into both eyes daily. )  . vitamin E 400 UNIT capsule Take 400 Units by mouth daily.   No facility-administered encounter medications on file as of 09/01/2019.     Current  Diagnosis/Assessment:  Goals Addressed   None    Significant time spent reviewing patient's chart to clarify metoprolol dosing. Will complete initial CCM visit in the future.   Diabetes   Unable to fully assess. Will assess at next visit.  A1c goal <7% FBG 90-130 (per endo) PPBG 130-180 (per endo)  Recent Relevant Labs: Lab Results  Component Value Date/Time   HGBA1C 8.2 (H) 05/30/2019 01:43 PM   HGBA1C 7.4 (H) 03/15/2019 08:24 AM   MICROALBUR 14.7 (H) 06/21/2019 11:20 AM   MICROALBUR 3.8 (H) 07/08/2018 08:14 AM    Last diabetic Foot exam:  Lab Results  Component Value Date/Time   HMDIABEYEEXA No Retinopathy 02/09/2019 12:00 AM    Last diabetic Eye exam: No results found for: HMDIABFOOTEX   Plan -Continue current medications   Hypertension   BP Goal <140/90  Office blood pressures are  BP Readings from Last 3 Encounters:  08/09/19 122/70  08/08/19 122/70  08/08/19 (!) 148/84   CMP Latest Ref Rng & Units 08/01/2019 06/21/2019 06/12/2019  Glucose 70 - 99 mg/dL 106(H) 354(H) 117(H)  BUN 6 - 23 mg/dL 12 14 30(H)  Creatinine 0.40 - 1.20 mg/dL 0.81 0.85 0.87  Sodium 135 - 145 mEq/L 135 132(L) 136  Potassium 3.5 - 5.1 mEq/L 4.0 4.1 4.3  Chloride 96 - 112 mEq/L 99 96 96(L)  CO2 19 - 32 mEq/L '28 27 27  '$ Calcium 8.4 - 10.5 mg/dL 9.5 9.2 9.4  Total Protein 6.0 - 8.3 g/dL 7.6 7.2 6.6  Total Bilirubin 0.2 - 1.2 mg/dL 1.0 0.9 0.9  Alkaline Phos 39 - 117 U/L 62 92 78  AST 0 - 37 U/L 15 13 32  ALT 0 - 35 U/L '12 15 29   '$ Kidney Function Lab Results  Component Value Date/Time   CREATININE 0.81 08/01/2019 09:09 AM   CREATININE 0.85 06/21/2019 11:20 AM   CREATININE 0.62 06/02/2011 01:28 PM   GFR 67.56 08/01/2019 09:09 AM   GFRNONAA >60 06/12/2019 06:32 AM   GFRNONAA >60 06/02/2011 01:28 PM   GFRAA >60 06/12/2019 06:32 AM   GFRAA >60 06/02/2011 01:28 PM   K 4.0 08/01/2019 09:09 AM   K 4.1 06/21/2019 11:20 AM   K 4.1 06/02/2011 01:28 PM   Patient has failed these meds in  the past: None noted  Patient is currently controlled on the following medications: Metoprolol succinate '100mg'$  daily  Patient's daughter wonders if patient is to continue metoprolol. Unclear if metoprolol tartrate was intentionally D/C by Dr. Dwyane Dee and metoprolol succinate restarted. Will  coordinate with Dr. Dwyane Dee to clarify in order to provide information to patient and patient's daughter.   Pt's daughter states that patient has been taking metoprolol tartrate '25mg'$  twice up until 4 days ago. She has run out of medication and wonders which dose of metoprolol her mother should continue.   Advised daughter to check pt's blood pressure and heart rate daily  Plan -Check heart rate and blood pressure daily  Hyperlipidemia   Unable to fully assess. Will assess at next visit.  LDL goal <100  Lipid Panel     Component Value Date/Time   CHOL 148 06/10/2019 0400   TRIG 198 (H) 06/10/2019 0400   HDL 32 (L) 06/10/2019 0400   LDLCALC 76 06/10/2019 0400    Hepatic Function Latest Ref Rng & Units 08/01/2019 06/21/2019 06/12/2019  Total Protein 6.0 - 8.3 g/dL 7.6 7.2 6.6  Albumin 3.5 - 5.2 g/dL 4.1 4.0 3.1(L)  AST 0 - 37 U/L 15 13 32  ALT 0 - 35 U/L '12 15 29  '$ Alk Phosphatase 39 - 117 U/L 62 92 78  Total Bilirubin 0.2 - 1.2 mg/dL 1.0 0.9 0.9  Bilirubin, Direct 0.0 - 0.3 mg/dL - - -     The ASCVD Risk score ([place]., et al., 2013) failed to calculate for the following reasons:   The 2013 ASCVD risk score is only valid for ages 71 to 15   The patient has a prior MI or stroke diagnosis   Patient has failed these meds in past: None noted  Patient is currently controlled on the following medications:  . Atorvastatin '10mg'$  daily  Plan -Continue current medications

## 2019-09-08 ENCOUNTER — Telehealth: Payer: Self-pay | Admitting: Internal Medicine

## 2019-09-08 DIAGNOSIS — Z03818 Encounter for observation for suspected exposure to other biological agents ruled out: Secondary | ICD-10-CM | POA: Diagnosis not present

## 2019-09-08 NOTE — Telephone Encounter (Signed)
Agree, needs to be seen

## 2019-09-08 NOTE — Telephone Encounter (Signed)
Just an FYI

## 2019-09-08 NOTE — Telephone Encounter (Signed)
Patient's daughter called in regards to her mother having Fever, and Uti Symptoms. Patient's daughter would like to speak with PCP. Patient's daughter informed that Dr.Paz is with a patient at this time. Per Paz's CMA Vilma Prader , patient urged to go to ED or clinic in [place].  Patient verbalized understanding.

## 2019-09-12 ENCOUNTER — Telehealth: Payer: Self-pay | Admitting: Internal Medicine

## 2019-09-12 NOTE — Telephone Encounter (Signed)
Left message for the patient's daughter, condolences provided. I am available if needed.

## 2019-09-12 NOTE — Telephone Encounter (Signed)
Per Spouse Patient passed away yesterday around 89 in [place].

## 2019-09-13 NOTE — Telephone Encounter (Signed)
Pt's daughter called back- requesting to speak w/ you.

## 2019-09-13 NOTE — Telephone Encounter (Signed)
Spoke with the patient's daughter, when the patient was in [place] apparently developed a UTI, subsequently sepsis, was admitted to the hospital unfortunately she did not survive. The family is extremely appreciative for the services she got from everyone in this office.  I will let my staff know.

## 2019-09-24 DEATH — deceased

## 2019-10-31 ENCOUNTER — Other Ambulatory Visit: Payer: Self-pay | Admitting: Family

## 2019-11-16 ENCOUNTER — Other Ambulatory Visit: Payer: Self-pay | Admitting: Family

## 2021-01-20 IMAGING — DX DG CHEST 1V PORT
1 series · 1 of 1 positions shown · non-contrast
Comparison: 06/02/2019

CLINICAL DATA: Evaluate for pneumonia.

EXAM:
PORTABLE CHEST 1 VIEW

[chest ap]
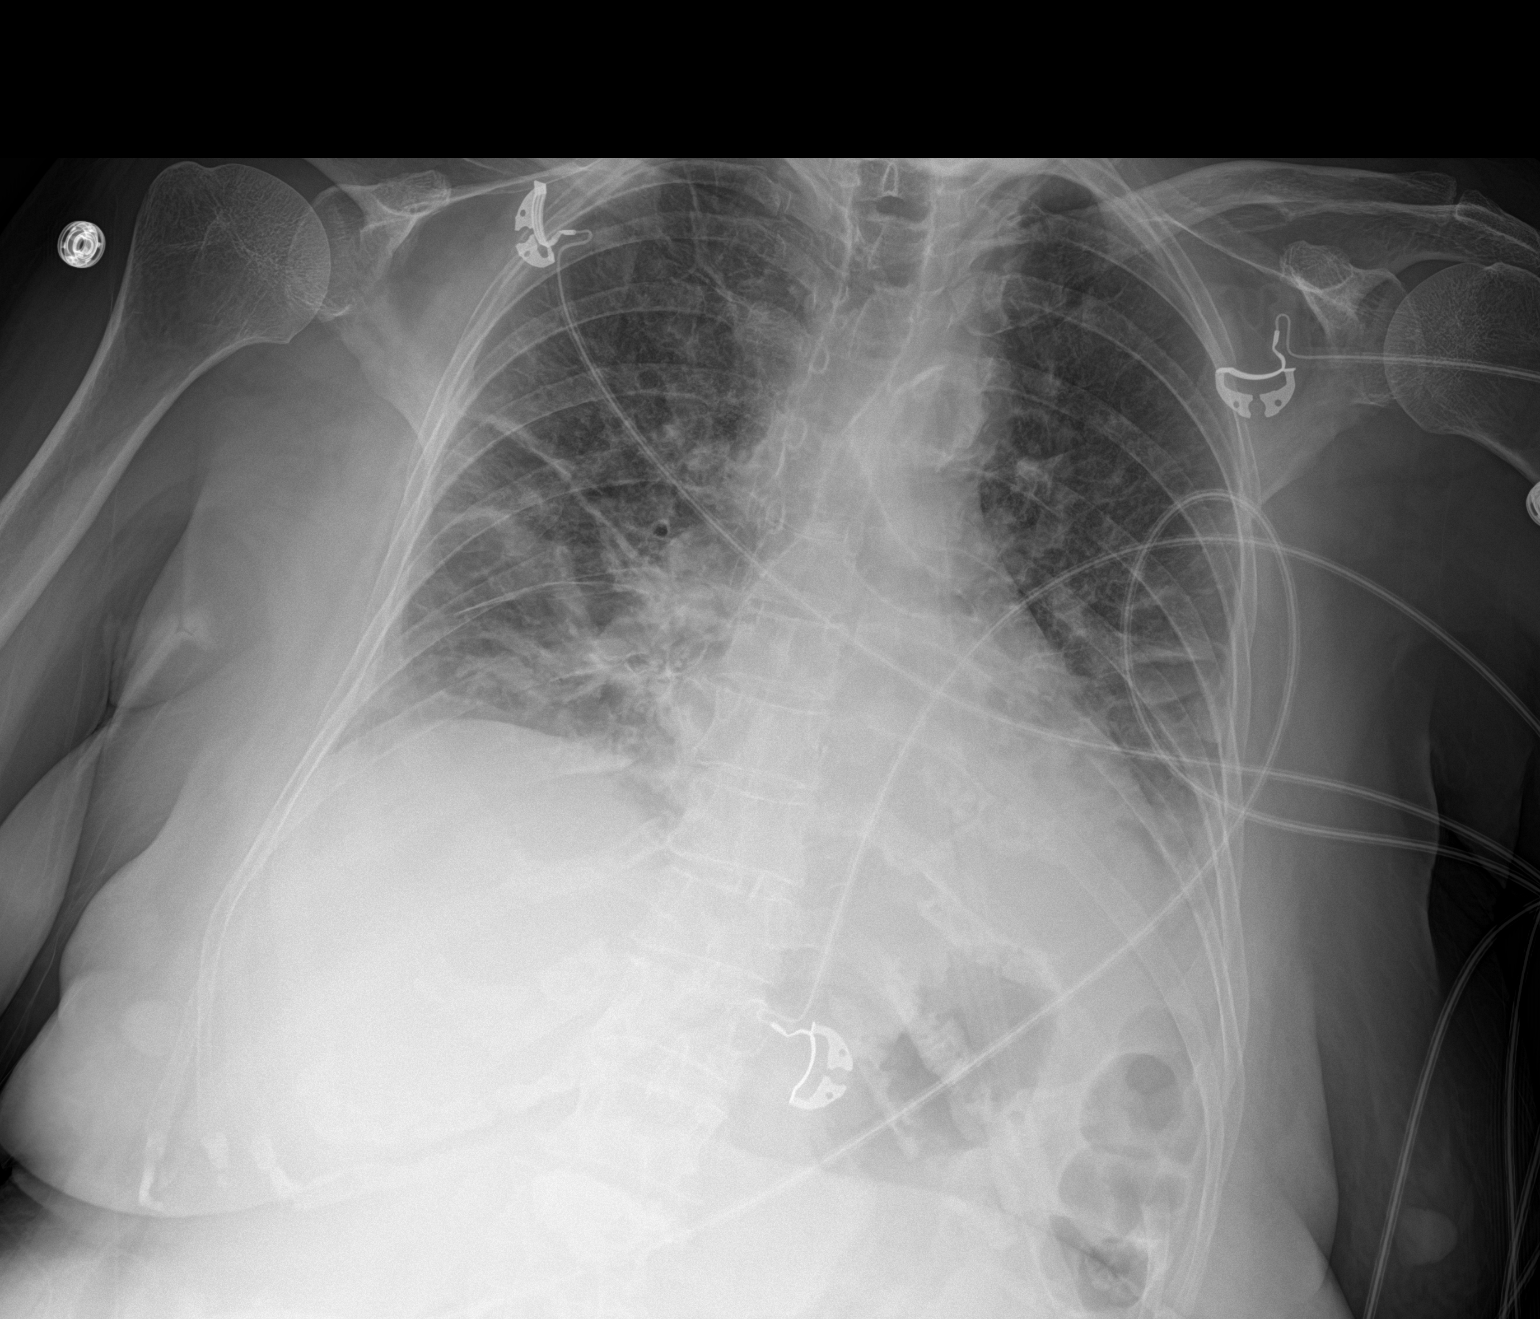

[1 of 1 positions shown; findings below may reference images not displayed]

FINDINGS: Lungs are adequately inflated and demonstrate stable hazy and linear
opacification over the mid to lower lungs. Possible small amount
left pleural fluid unchanged. Cardiomediastinal silhouette and
remainder the exam is unchanged.
IMPRESSION: Persistent mild airspace process over the mid to lower lungs likely
infection with associated linear atelectasis. Small stable left
pleural effusion.

## 2021-01-22 IMAGING — CR DG CHEST 2V
2 series · 2 of 2 positions shown · non-contrast
Comparison: 06/04/2019

CLINICAL DATA: Pneumonia

EXAM:
CHEST - 2 VIEW

[chest lat]
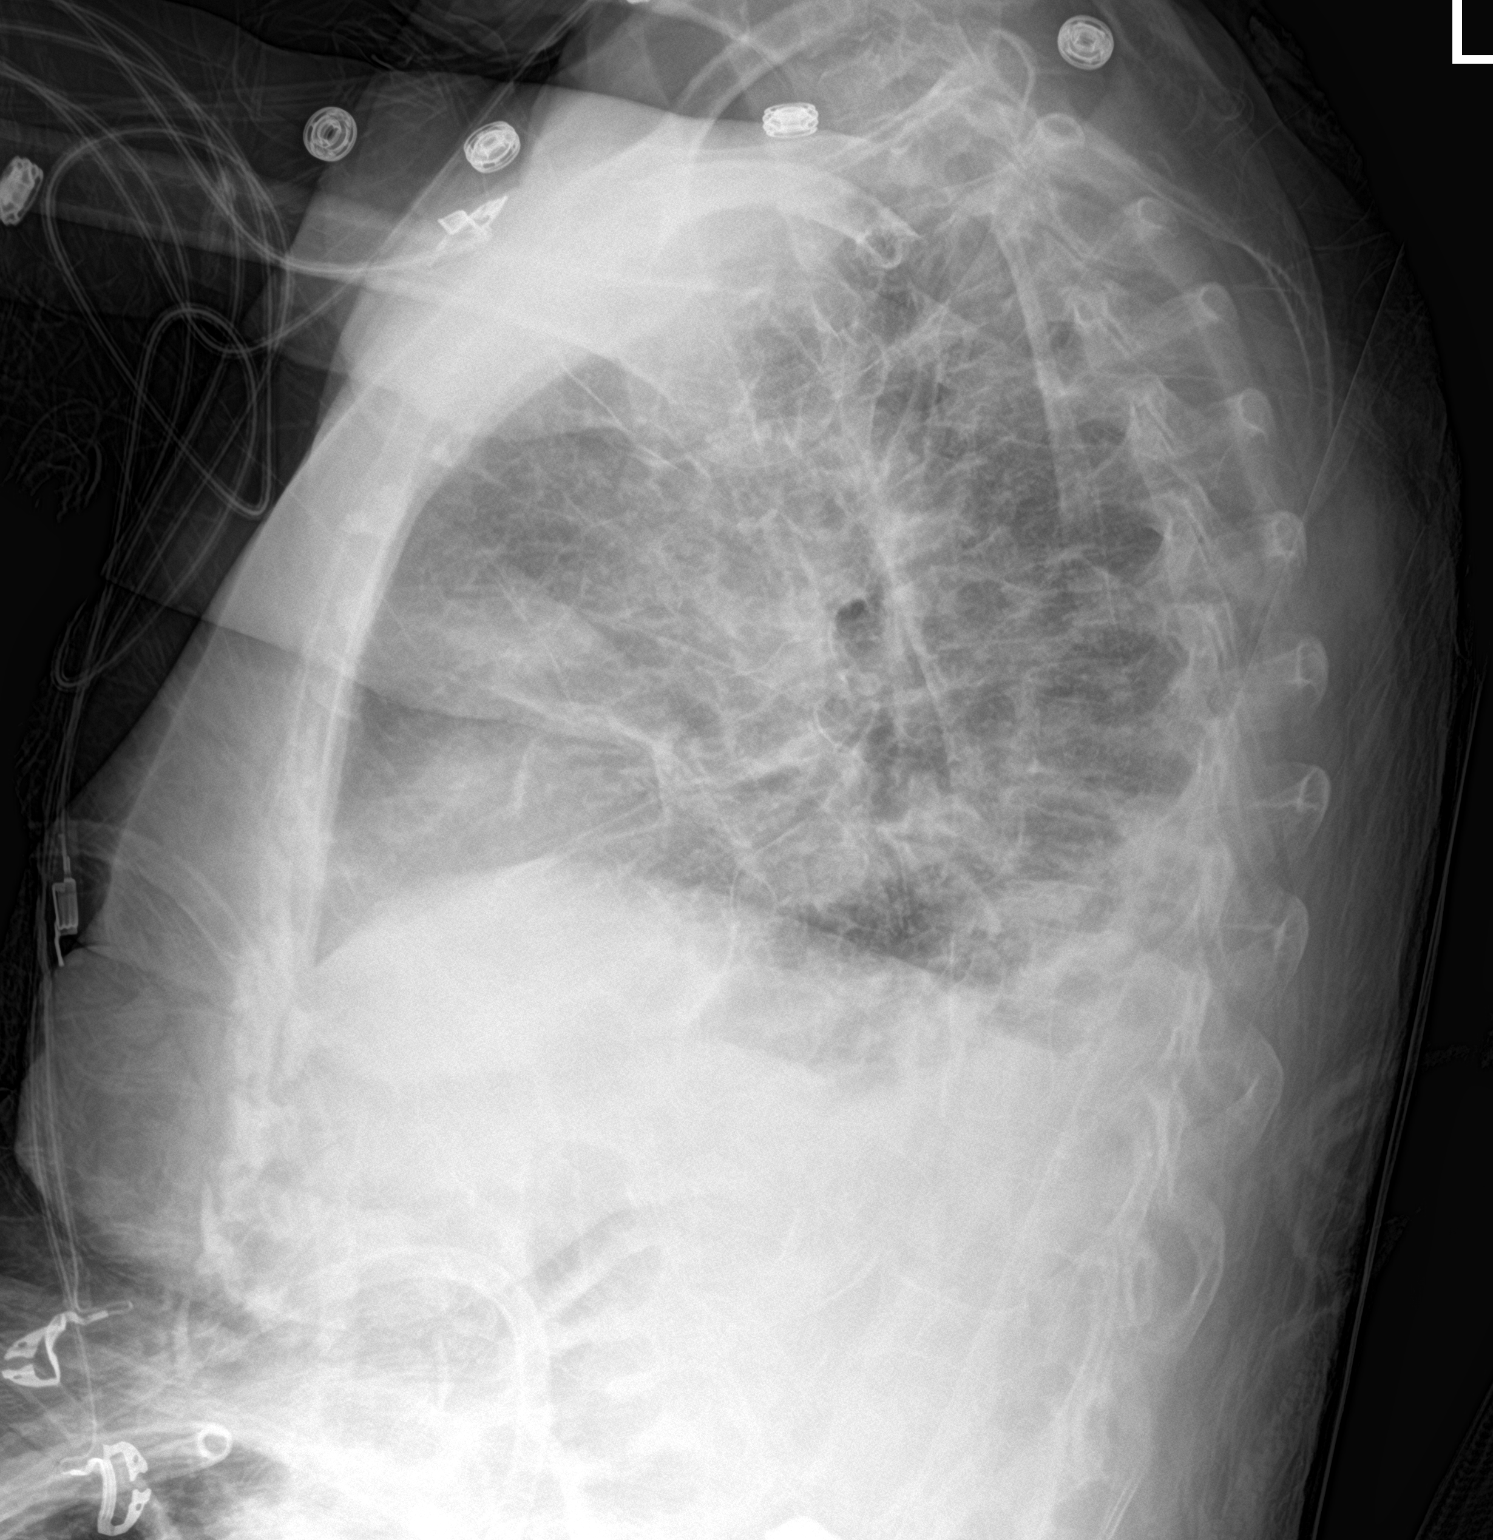

[chest ap]
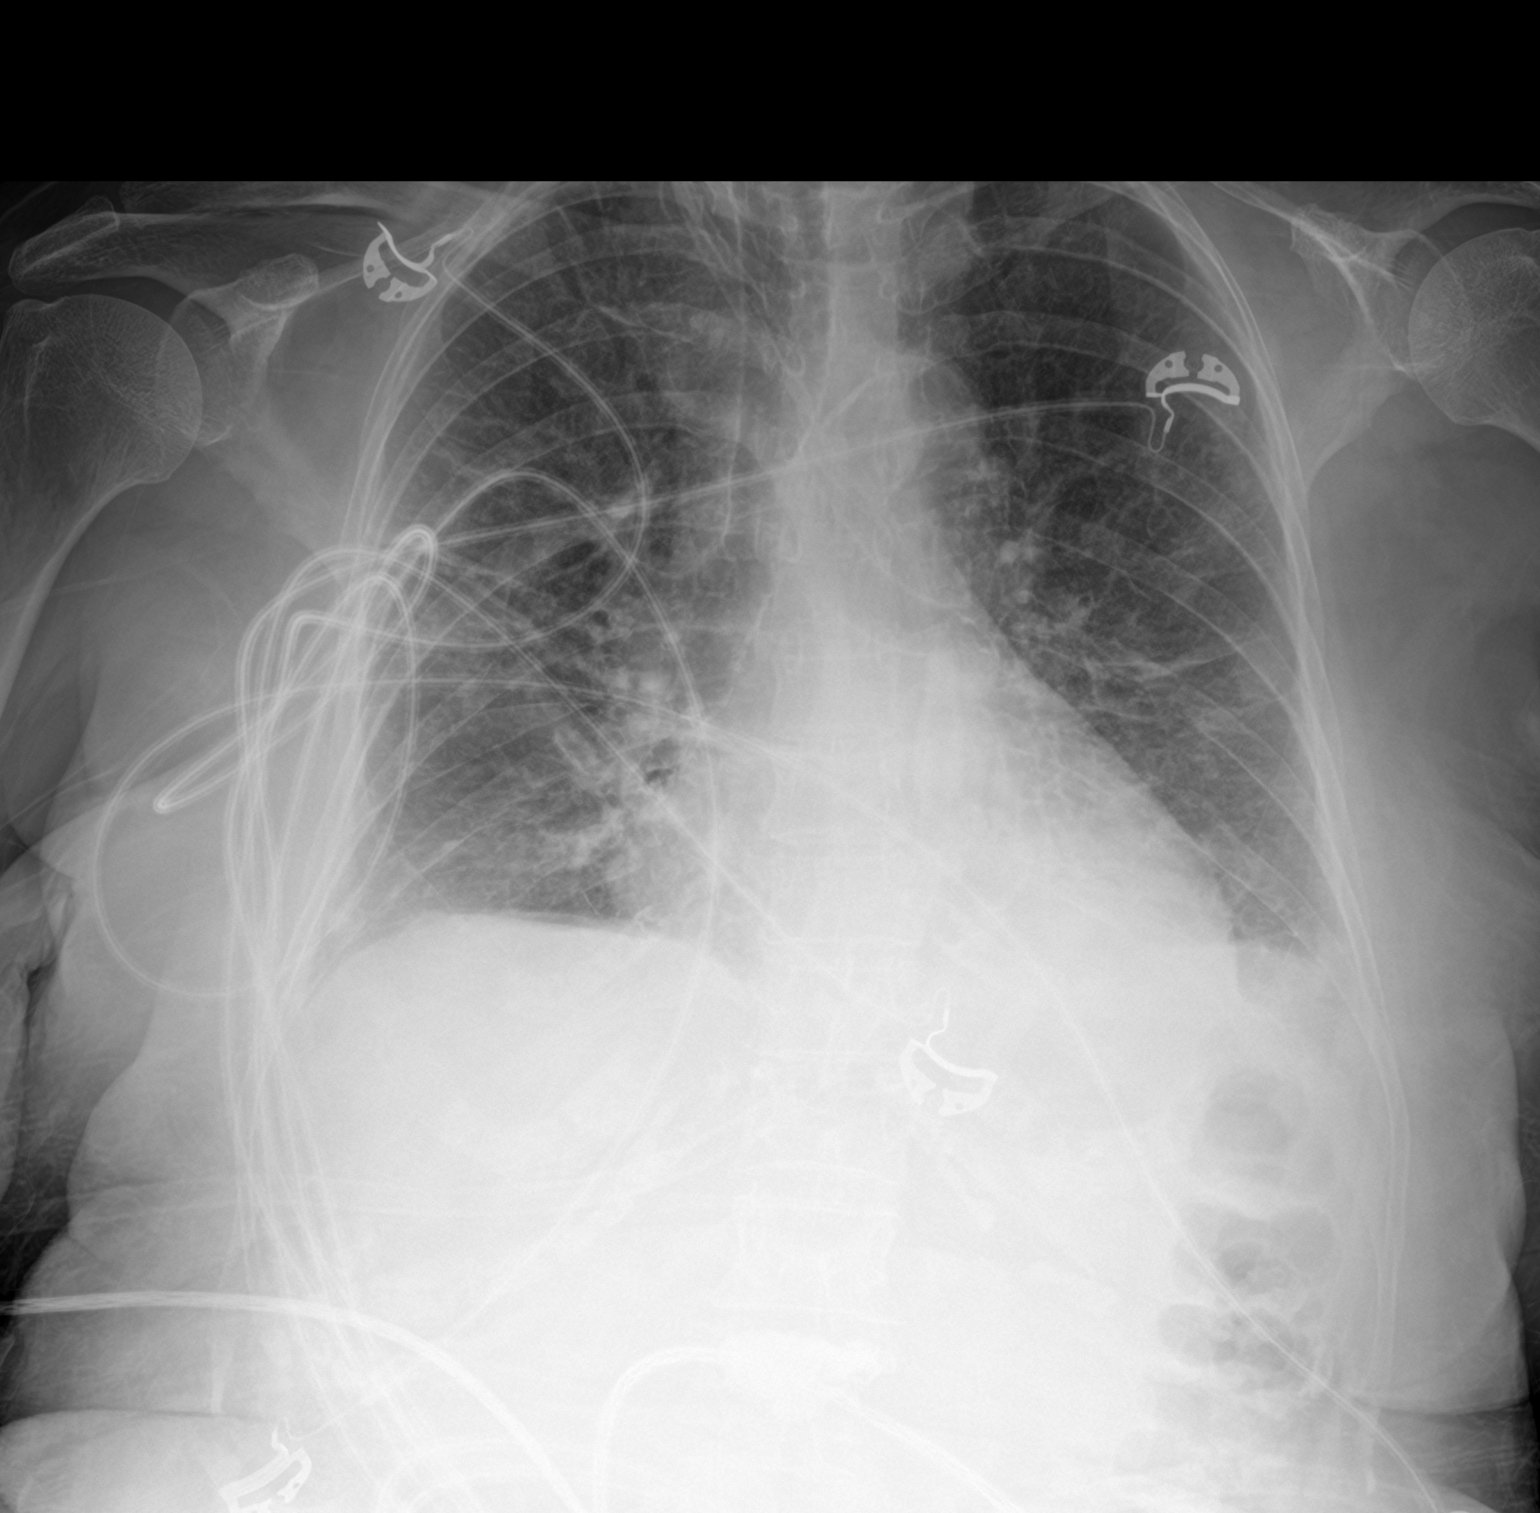

[2 of 2 positions shown; findings below may reference images not displayed]

FINDINGS: Heart is normal size. Improving aeration with decreasing bilateral
airspace disease. No visible effusions or pneumothorax. No acute
bony abnormality.
IMPRESSION: Improving bilateral airspace disease.

## 2021-01-25 IMAGING — DX DG CHEST 1V PORT
1 series · 1 of 1 positions shown · non-contrast
Comparison: 06/06/2019

CLINICAL DATA: Pulmonary edema

EXAM:
PORTABLE CHEST 1 VIEW

[chest ap]
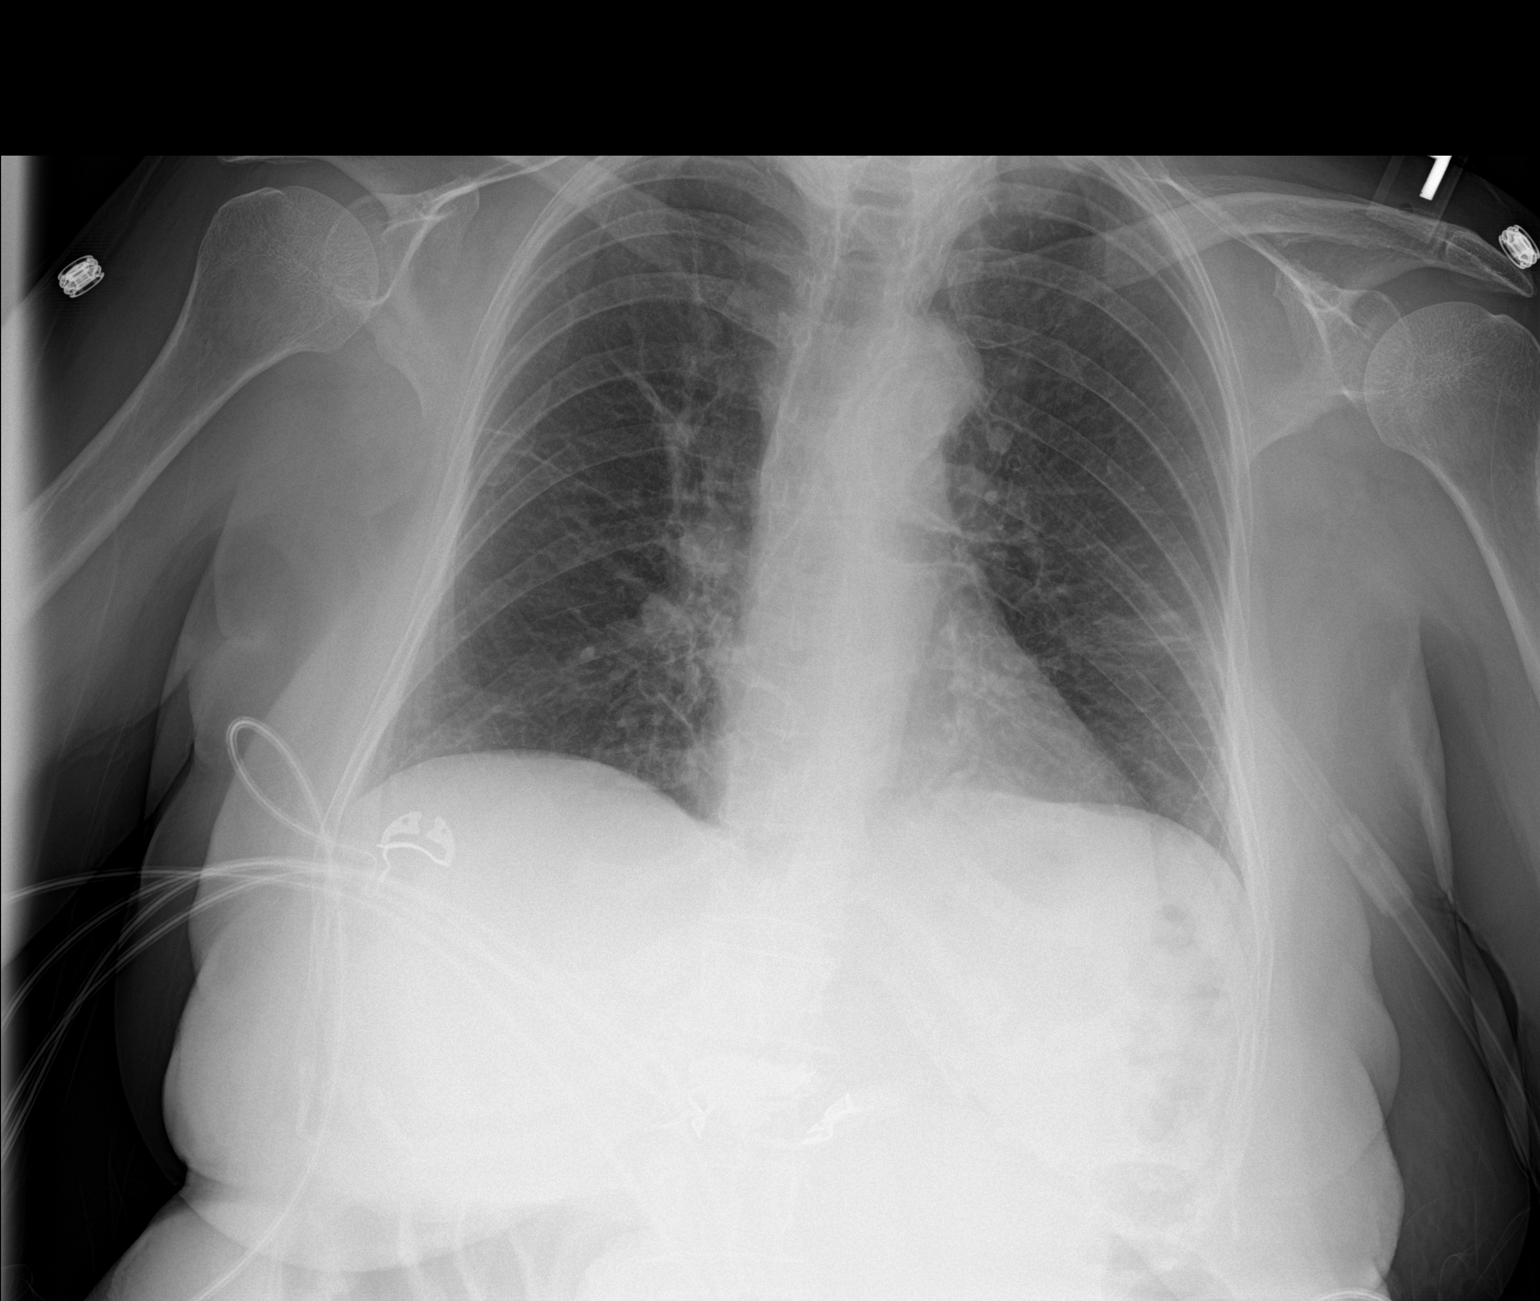

[1 of 1 positions shown; findings below may reference images not displayed]

FINDINGS: There is mild left basilar atelectasis. There is no focal
consolidation. There is no pleural effusion or pneumothorax. The
heart and mediastinal contours are unremarkable.

There is no acute osseous abnormality.
IMPRESSION: No active disease.
